# Patient Record
Sex: Male | Born: 1940 | Race: White | Hispanic: No | Smoking: Former smoker
Health system: Southern US, Community
[De-identification: ages and names within clinical notes are randomized; demographics above are authoritative.]

## PROBLEM LIST (undated history)

## (undated) DIAGNOSIS — I10 Essential (primary) hypertension: Secondary | ICD-10-CM

## (undated) DIAGNOSIS — C61 Malignant neoplasm of prostate: Secondary | ICD-10-CM

## (undated) DIAGNOSIS — M1990 Unspecified osteoarthritis, unspecified site: Secondary | ICD-10-CM

## (undated) DIAGNOSIS — L732 Hidradenitis suppurativa: Secondary | ICD-10-CM

## (undated) DIAGNOSIS — E785 Hyperlipidemia, unspecified: Secondary | ICD-10-CM

## (undated) DIAGNOSIS — I219 Acute myocardial infarction, unspecified: Secondary | ICD-10-CM

## (undated) DIAGNOSIS — J189 Pneumonia, unspecified organism: Secondary | ICD-10-CM

## (undated) DIAGNOSIS — K219 Gastro-esophageal reflux disease without esophagitis: Secondary | ICD-10-CM

## (undated) DIAGNOSIS — I739 Peripheral vascular disease, unspecified: Secondary | ICD-10-CM

## (undated) DIAGNOSIS — IMO0002 Reserved for concepts with insufficient information to code with codable children: Secondary | ICD-10-CM

## (undated) DIAGNOSIS — R350 Frequency of micturition: Secondary | ICD-10-CM

## (undated) DIAGNOSIS — I714 Abdominal aortic aneurysm, without rupture, unspecified: Secondary | ICD-10-CM

## (undated) DIAGNOSIS — R3915 Urgency of urination: Secondary | ICD-10-CM

## (undated) DIAGNOSIS — IMO0001 Reserved for inherently not codable concepts without codable children: Secondary | ICD-10-CM

## (undated) DIAGNOSIS — I779 Disorder of arteries and arterioles, unspecified: Secondary | ICD-10-CM

## (undated) DIAGNOSIS — I2510 Atherosclerotic heart disease of native coronary artery without angina pectoris: Secondary | ICD-10-CM

## (undated) DIAGNOSIS — I491 Atrial premature depolarization: Secondary | ICD-10-CM

## (undated) DIAGNOSIS — C3490 Malignant neoplasm of unspecified part of unspecified bronchus or lung: Secondary | ICD-10-CM

## (undated) DIAGNOSIS — I639 Cerebral infarction, unspecified: Secondary | ICD-10-CM

## (undated) DIAGNOSIS — J449 Chronic obstructive pulmonary disease, unspecified: Secondary | ICD-10-CM

## (undated) DIAGNOSIS — I255 Ischemic cardiomyopathy: Secondary | ICD-10-CM

## (undated) DIAGNOSIS — F419 Anxiety disorder, unspecified: Secondary | ICD-10-CM

## (undated) HISTORY — PX: PROSTATE SURGERY: SHX751

## (undated) HISTORY — DX: Malignant neoplasm of prostate: C61

## (undated) HISTORY — PX: VASCULAR SURGERY: SHX849

## (undated) HISTORY — DX: Unspecified osteoarthritis, unspecified site: M19.90

## (undated) HISTORY — DX: Ischemic cardiomyopathy: I25.5

## (undated) HISTORY — DX: Pneumonia, unspecified organism: J18.9

## (undated) HISTORY — DX: Anxiety disorder, unspecified: F41.9

## (undated) HISTORY — DX: Hyperlipidemia, unspecified: E78.5

## (undated) HISTORY — DX: Peripheral vascular disease, unspecified: I73.9

## (undated) HISTORY — DX: Disorder of arteries and arterioles, unspecified: I77.9

## (undated) HISTORY — DX: Hidradenitis suppurativa: L73.2

## (undated) HISTORY — DX: Atrial premature depolarization: I49.1

## (undated) HISTORY — DX: Cerebral infarction, unspecified: I63.9

## (undated) HISTORY — DX: Essential (primary) hypertension: I10

## (undated) HISTORY — DX: Chronic obstructive pulmonary disease, unspecified: J44.9

## (undated) HISTORY — PX: CARDIAC CATHETERIZATION: SHX172

## (undated) HISTORY — PX: CORONARY STENT PLACEMENT: SHX1402

## (undated) HISTORY — DX: Atherosclerotic heart disease of native coronary artery without angina pectoris: I25.10

---

## 1993-03-22 HISTORY — PX: DOPPLER ECHOCARDIOGRAPHY: SHX263

## 1998-11-19 ENCOUNTER — Inpatient Hospital Stay (HOSPITAL_COMMUNITY): Admission: EM | Admit: 1998-11-19 | Discharge: 1998-11-23 | Payer: Self-pay | Admitting: Emergency Medicine

## 1998-11-20 ENCOUNTER — Encounter: Payer: Self-pay | Admitting: *Deleted

## 2000-03-22 HISTORY — PX: CAROTID ENDARTERECTOMY: SUR193

## 2000-03-24 ENCOUNTER — Encounter: Payer: Self-pay | Admitting: Vascular Surgery

## 2000-03-28 ENCOUNTER — Encounter (INDEPENDENT_AMBULATORY_CARE_PROVIDER_SITE_OTHER): Payer: Self-pay | Admitting: *Deleted

## 2000-03-28 ENCOUNTER — Inpatient Hospital Stay (HOSPITAL_COMMUNITY): Admission: RE | Admit: 2000-03-28 | Discharge: 2000-03-29 | Payer: Self-pay | Admitting: Vascular Surgery

## 2001-04-22 HISTORY — PX: COLONOSCOPY: SHX174

## 2004-01-30 ENCOUNTER — Ambulatory Visit: Payer: Self-pay | Admitting: Family Medicine

## 2004-09-14 ENCOUNTER — Ambulatory Visit: Payer: Self-pay | Admitting: Family Medicine

## 2004-09-28 ENCOUNTER — Ambulatory Visit: Payer: Self-pay

## 2004-09-29 ENCOUNTER — Ambulatory Visit: Payer: Self-pay | Admitting: Family Medicine

## 2004-11-11 ENCOUNTER — Ambulatory Visit: Payer: Self-pay | Admitting: Cardiology

## 2004-11-30 ENCOUNTER — Ambulatory Visit: Payer: Self-pay | Admitting: Family Medicine

## 2004-12-10 ENCOUNTER — Ambulatory Visit: Payer: Self-pay | Admitting: Internal Medicine

## 2005-01-11 ENCOUNTER — Ambulatory Visit: Payer: Self-pay | Admitting: Family Medicine

## 2005-01-13 ENCOUNTER — Ambulatory Visit: Payer: Self-pay | Admitting: Critical Care Medicine

## 2005-02-09 ENCOUNTER — Ambulatory Visit: Payer: Self-pay | Admitting: Critical Care Medicine

## 2005-02-16 ENCOUNTER — Ambulatory Visit: Payer: Self-pay | Admitting: Critical Care Medicine

## 2005-05-21 ENCOUNTER — Ambulatory Visit: Payer: Self-pay | Admitting: Family Medicine

## 2005-05-26 ENCOUNTER — Ambulatory Visit: Payer: Self-pay | Admitting: Family Medicine

## 2005-05-26 LAB — CONVERTED CEMR LAB: PSA: 5.76 ng/mL

## 2005-10-11 ENCOUNTER — Ambulatory Visit: Payer: Self-pay | Admitting: Family Medicine

## 2005-11-18 ENCOUNTER — Ambulatory Visit: Payer: Self-pay | Admitting: Cardiology

## 2005-12-03 ENCOUNTER — Ambulatory Visit: Payer: Self-pay | Admitting: Critical Care Medicine

## 2005-12-21 ENCOUNTER — Ambulatory Visit: Payer: Self-pay | Admitting: Family Medicine

## 2006-02-02 ENCOUNTER — Ambulatory Visit: Payer: Self-pay | Admitting: Family Medicine

## 2006-05-02 ENCOUNTER — Ambulatory Visit: Payer: Self-pay | Admitting: Family Medicine

## 2006-05-07 ENCOUNTER — Inpatient Hospital Stay (HOSPITAL_COMMUNITY): Admission: EM | Admit: 2006-05-07 | Discharge: 2006-05-11 | Payer: Self-pay | Admitting: Emergency Medicine

## 2006-05-07 ENCOUNTER — Ambulatory Visit: Payer: Self-pay | Admitting: Internal Medicine

## 2006-05-18 ENCOUNTER — Ambulatory Visit: Payer: Self-pay | Admitting: Family Medicine

## 2006-06-16 ENCOUNTER — Ambulatory Visit: Payer: Self-pay | Admitting: Family Medicine

## 2006-12-21 HISTORY — PX: CARDIOVASCULAR STRESS TEST: SHX262

## 2006-12-23 ENCOUNTER — Ambulatory Visit: Payer: Self-pay | Admitting: Cardiology

## 2006-12-30 ENCOUNTER — Encounter: Payer: Self-pay | Admitting: Family Medicine

## 2006-12-30 ENCOUNTER — Ambulatory Visit: Payer: Self-pay

## 2007-01-20 ENCOUNTER — Ambulatory Visit: Payer: Self-pay | Admitting: Family Medicine

## 2007-01-27 ENCOUNTER — Ambulatory Visit: Payer: Self-pay | Admitting: Family Medicine

## 2007-01-27 DIAGNOSIS — I2510 Atherosclerotic heart disease of native coronary artery without angina pectoris: Secondary | ICD-10-CM

## 2007-01-27 DIAGNOSIS — E7800 Pure hypercholesterolemia, unspecified: Secondary | ICD-10-CM | POA: Insufficient documentation

## 2007-01-30 LAB — CONVERTED CEMR LAB
AST: 19 units/L (ref 0–37)
Bilirubin, Direct: 0.1 mg/dL (ref 0.0–0.3)
CO2: 33 meq/L — ABNORMAL HIGH (ref 19–32)
Chloride: 101 meq/L (ref 96–112)
Eosinophils Absolute: 0.2 10*3/uL (ref 0.0–0.6)
Eosinophils Relative: 2.7 % (ref 0.0–5.0)
GFR calc non Af Amer: 71 mL/min
Glucose, Bld: 107 mg/dL — ABNORMAL HIGH (ref 70–99)
HCT: 45 % (ref 39.0–52.0)
Lymphocytes Relative: 28.3 % (ref 12.0–46.0)
MCV: 96.4 fL (ref 78.0–100.0)
Neutrophils Relative %: 60.1 % (ref 43.0–77.0)
RBC: 4.67 M/uL (ref 4.22–5.81)
Sodium: 140 meq/L (ref 135–145)
Total Bilirubin: 0.7 mg/dL (ref 0.3–1.2)
Total CHOL/HDL Ratio: 3.9
Total Protein: 7 g/dL (ref 6.0–8.3)
WBC: 6.7 10*3/uL (ref 4.5–10.5)

## 2007-02-10 ENCOUNTER — Ambulatory Visit: Payer: Self-pay | Admitting: Family Medicine

## 2007-02-10 DIAGNOSIS — I2510 Atherosclerotic heart disease of native coronary artery without angina pectoris: Secondary | ICD-10-CM | POA: Insufficient documentation

## 2007-02-10 DIAGNOSIS — E785 Hyperlipidemia, unspecified: Secondary | ICD-10-CM | POA: Insufficient documentation

## 2007-02-10 DIAGNOSIS — I252 Old myocardial infarction: Secondary | ICD-10-CM | POA: Insufficient documentation

## 2007-02-10 DIAGNOSIS — F17200 Nicotine dependence, unspecified, uncomplicated: Secondary | ICD-10-CM

## 2007-02-10 DIAGNOSIS — K649 Unspecified hemorrhoids: Secondary | ICD-10-CM | POA: Insufficient documentation

## 2007-02-10 DIAGNOSIS — M1990 Unspecified osteoarthritis, unspecified site: Secondary | ICD-10-CM | POA: Insufficient documentation

## 2007-02-10 DIAGNOSIS — J449 Chronic obstructive pulmonary disease, unspecified: Secondary | ICD-10-CM

## 2007-02-10 DIAGNOSIS — J438 Other emphysema: Secondary | ICD-10-CM | POA: Insufficient documentation

## 2007-02-10 DIAGNOSIS — H3400 Transient retinal artery occlusion, unspecified eye: Secondary | ICD-10-CM

## 2007-02-10 DIAGNOSIS — I739 Peripheral vascular disease, unspecified: Secondary | ICD-10-CM | POA: Insufficient documentation

## 2007-05-26 ENCOUNTER — Telehealth: Payer: Self-pay | Admitting: Family Medicine

## 2007-06-19 ENCOUNTER — Ambulatory Visit: Payer: Self-pay | Admitting: Family Medicine

## 2007-06-19 DIAGNOSIS — J441 Chronic obstructive pulmonary disease with (acute) exacerbation: Secondary | ICD-10-CM | POA: Insufficient documentation

## 2007-06-22 ENCOUNTER — Ambulatory Visit: Payer: Self-pay | Admitting: Family Medicine

## 2007-06-26 ENCOUNTER — Telehealth (INDEPENDENT_AMBULATORY_CARE_PROVIDER_SITE_OTHER): Payer: Self-pay | Admitting: Internal Medicine

## 2007-07-07 ENCOUNTER — Ambulatory Visit: Payer: Self-pay | Admitting: Cardiology

## 2007-07-14 ENCOUNTER — Ambulatory Visit: Payer: Self-pay | Admitting: Family Medicine

## 2007-07-14 LAB — CONVERTED CEMR LAB
Albumin: 4 g/dL (ref 3.5–5.2)
BUN: 17 mg/dL (ref 6–23)
Basophils Relative: 0.3 % (ref 0.0–1.0)
Creatinine, Ser: 1 mg/dL (ref 0.4–1.5)
Eosinophils Absolute: 0.3 10*3/uL (ref 0.0–0.7)
Eosinophils Relative: 4.7 % (ref 0.0–5.0)
GFR calc Af Amer: 96 mL/min
GFR calc non Af Amer: 79 mL/min
Glucose, Bld: 106 mg/dL — ABNORMAL HIGH (ref 70–99)
HCT: 45.2 % (ref 39.0–52.0)
HDL: 47.7 mg/dL (ref >39.0)
Hemoglobin: 15.7 g/dL (ref 13.0–17.0)
MCV: 96.2 fL (ref 78.0–100.0)
Monocytes Absolute: 0.5 10*3/uL (ref 0.1–1.0)
Neutro Abs: 4.5 10*3/uL (ref 1.4–7.7)
RBC: 4.7 M/uL (ref 4.22–5.81)
TSH: 1.48 microintl units/mL (ref 0.35–5.50)
Total Protein: 6.7 g/dL (ref 6.0–8.3)
WBC: 7.1 10*3/uL (ref 4.5–10.5)

## 2007-09-05 ENCOUNTER — Ambulatory Visit: Payer: Self-pay | Admitting: Cardiology

## 2007-09-05 LAB — CONVERTED CEMR LAB
BUN: 12 mg/dL (ref 6–23)
Chloride: 104 meq/L (ref 96–112)
Eosinophils Absolute: 0.2 10*3/uL (ref 0.0–0.7)
Eosinophils Relative: 2.7 % (ref 0.0–5.0)
GFR calc Af Amer: 108 mL/min
GFR calc non Af Amer: 89 mL/min
INR: 0.9 (ref 0.8–1.0)
MCV: 99 fL (ref 78.0–100.0)
Monocytes Absolute: 0.4 10*3/uL (ref 0.1–1.0)
Neutrophils Relative %: 64.5 % (ref 43.0–77.0)
Platelets: 238 10*3/uL (ref 150–400)
Potassium: 4.2 meq/L (ref 3.5–5.1)
RDW: 12.8 % (ref 11.5–14.6)
Sodium: 140 meq/L (ref 135–145)
WBC: 7.4 10*3/uL (ref 4.5–10.5)
aPTT: 33 s — ABNORMAL HIGH (ref 21.7–29.8)

## 2007-09-07 ENCOUNTER — Inpatient Hospital Stay (HOSPITAL_BASED_OUTPATIENT_CLINIC_OR_DEPARTMENT_OTHER): Admission: RE | Admit: 2007-09-07 | Discharge: 2007-09-07 | Payer: Self-pay | Admitting: Cardiology

## 2007-09-07 ENCOUNTER — Inpatient Hospital Stay (HOSPITAL_COMMUNITY): Admission: AD | Admit: 2007-09-07 | Discharge: 2007-09-08 | Payer: Self-pay | Admitting: Cardiology

## 2007-09-07 ENCOUNTER — Encounter: Payer: Self-pay | Admitting: Family Medicine

## 2007-09-07 ENCOUNTER — Ambulatory Visit: Payer: Self-pay | Admitting: Cardiology

## 2007-09-08 ENCOUNTER — Encounter: Payer: Self-pay | Admitting: Family Medicine

## 2007-09-15 ENCOUNTER — Telehealth: Payer: Self-pay | Admitting: Family Medicine

## 2007-09-18 ENCOUNTER — Ambulatory Visit: Payer: Self-pay | Admitting: Family Medicine

## 2007-09-29 ENCOUNTER — Ambulatory Visit: Payer: Self-pay | Admitting: Cardiology

## 2007-11-06 ENCOUNTER — Telehealth (INDEPENDENT_AMBULATORY_CARE_PROVIDER_SITE_OTHER): Payer: Self-pay | Admitting: *Deleted

## 2008-04-05 ENCOUNTER — Ambulatory Visit: Payer: Self-pay | Admitting: Cardiology

## 2008-05-03 ENCOUNTER — Ambulatory Visit: Payer: Self-pay | Admitting: Cardiology

## 2008-05-03 LAB — CONVERTED CEMR LAB
Alkaline Phosphatase: 37 units/L — ABNORMAL LOW (ref 39–117)
Basophils Absolute: 0 10*3/uL (ref 0.0–0.1)
Bilirubin, Direct: 0.1 mg/dL (ref 0.0–0.3)
Calcium: 9.2 mg/dL (ref 8.4–10.5)
Cholesterol: 144 mg/dL (ref 0–200)
Eosinophils Absolute: 0.2 10*3/uL (ref 0.0–0.7)
GFR calc Af Amer: 108 mL/min
GFR calc non Af Amer: 89 mL/min
Glucose, Bld: 110 mg/dL — ABNORMAL HIGH (ref 70–99)
HCT: 44.7 % (ref 39.0–52.0)
HDL: 41.7 mg/dL (ref >39.0)
Hemoglobin: 15.7 g/dL (ref 13.0–17.0)
MCHC: 35.1 g/dL (ref 30.0–36.0)
MCV: 98.2 fL (ref 78.0–100.0)
Monocytes Absolute: 0.4 10*3/uL (ref 0.1–1.0)
Monocytes Relative: 7.5 % (ref 3.0–12.0)
Neutro Abs: 3.6 10*3/uL (ref 1.4–7.7)
Platelets: 225 10*3/uL (ref 150–400)
Potassium: 4.4 meq/L (ref 3.5–5.1)
RDW: 12.2 % (ref 11.5–14.6)
Sodium: 138 meq/L (ref 135–145)
Total CHOL/HDL Ratio: 3.5
Total Protein: 6.6 g/dL (ref 6.0–8.3)
Triglycerides: 66 mg/dL (ref 0–149)

## 2008-09-18 ENCOUNTER — Ambulatory Visit: Payer: Self-pay | Admitting: Family Medicine

## 2008-09-19 ENCOUNTER — Encounter (INDEPENDENT_AMBULATORY_CARE_PROVIDER_SITE_OTHER): Payer: Self-pay | Admitting: Internal Medicine

## 2008-09-19 DIAGNOSIS — Z8546 Personal history of malignant neoplasm of prostate: Secondary | ICD-10-CM

## 2008-09-24 ENCOUNTER — Telehealth: Payer: Self-pay | Admitting: Family Medicine

## 2008-09-24 ENCOUNTER — Emergency Department (HOSPITAL_COMMUNITY): Admission: EM | Admit: 2008-09-24 | Discharge: 2008-09-24 | Payer: Self-pay | Admitting: Emergency Medicine

## 2008-09-25 ENCOUNTER — Ambulatory Visit: Payer: Self-pay | Admitting: Family Medicine

## 2008-09-29 DIAGNOSIS — I10 Essential (primary) hypertension: Secondary | ICD-10-CM

## 2008-10-01 ENCOUNTER — Ambulatory Visit: Payer: Self-pay | Admitting: Cardiology

## 2008-10-31 ENCOUNTER — Encounter: Payer: Self-pay | Admitting: Family Medicine

## 2008-11-04 ENCOUNTER — Telehealth: Payer: Self-pay | Admitting: Cardiology

## 2008-11-14 ENCOUNTER — Encounter: Payer: Self-pay | Admitting: Family Medicine

## 2008-11-20 ENCOUNTER — Ambulatory Visit: Admission: RE | Admit: 2008-11-20 | Discharge: 2009-02-18 | Payer: Self-pay | Admitting: Radiation Oncology

## 2008-11-21 ENCOUNTER — Encounter: Payer: Self-pay | Admitting: Family Medicine

## 2008-11-26 ENCOUNTER — Telehealth: Payer: Self-pay | Admitting: Cardiology

## 2009-01-24 LAB — URINALYSIS, MICROSCOPIC - CHCC
Blood: NEGATIVE
Nitrite: NEGATIVE
Protein: NEGATIVE mg/dL
Specific Gravity, Urine: 1.01 (ref 1.003–1.035)
pH: 5 (ref 4.6–8.0)

## 2009-02-11 ENCOUNTER — Encounter (INDEPENDENT_AMBULATORY_CARE_PROVIDER_SITE_OTHER): Payer: Self-pay | Admitting: Internal Medicine

## 2009-02-19 ENCOUNTER — Ambulatory Visit: Admission: RE | Admit: 2009-02-19 | Discharge: 2009-02-28 | Payer: Self-pay | Admitting: Radiation Oncology

## 2009-02-26 ENCOUNTER — Encounter: Payer: Self-pay | Admitting: Family Medicine

## 2009-04-02 ENCOUNTER — Encounter: Payer: Self-pay | Admitting: Family Medicine

## 2009-06-12 ENCOUNTER — Telehealth (INDEPENDENT_AMBULATORY_CARE_PROVIDER_SITE_OTHER): Payer: Self-pay | Admitting: *Deleted

## 2009-06-23 ENCOUNTER — Ambulatory Visit: Payer: Self-pay | Admitting: Family Medicine

## 2009-08-22 ENCOUNTER — Telehealth: Payer: Self-pay | Admitting: Gastroenterology

## 2009-10-02 ENCOUNTER — Ambulatory Visit: Payer: Self-pay | Admitting: Cardiology

## 2009-10-20 ENCOUNTER — Telehealth: Payer: Self-pay | Admitting: Family Medicine

## 2009-10-28 ENCOUNTER — Ambulatory Visit: Payer: Self-pay | Admitting: Family Medicine

## 2009-10-29 ENCOUNTER — Telehealth: Payer: Self-pay | Admitting: Family Medicine

## 2009-10-29 LAB — CONVERTED CEMR LAB
Cholesterol: 175 mg/dL (ref 0–200)
HDL: 46.7 mg/dL (ref >39.00)
LDL Cholesterol: 114 mg/dL — ABNORMAL HIGH (ref 0–99)
Triglycerides: 72 mg/dL (ref 0.0–149.0)

## 2009-11-04 ENCOUNTER — Telehealth: Payer: Self-pay | Admitting: Family Medicine

## 2009-11-04 ENCOUNTER — Telehealth: Payer: Self-pay | Admitting: Cardiology

## 2009-11-21 ENCOUNTER — Ambulatory Visit: Payer: Self-pay | Admitting: Emergency Medicine

## 2009-12-12 ENCOUNTER — Ambulatory Visit: Payer: Self-pay | Admitting: Family Medicine

## 2009-12-12 DIAGNOSIS — F419 Anxiety disorder, unspecified: Secondary | ICD-10-CM

## 2010-01-02 ENCOUNTER — Ambulatory Visit: Payer: Self-pay | Admitting: Emergency Medicine

## 2010-01-22 ENCOUNTER — Emergency Department (HOSPITAL_COMMUNITY)
Admission: EM | Admit: 2010-01-22 | Discharge: 2010-01-22 | Payer: Self-pay | Source: Home / Self Care | Admitting: Emergency Medicine

## 2010-01-26 ENCOUNTER — Ambulatory Visit: Payer: Self-pay | Admitting: Family Medicine

## 2010-02-10 ENCOUNTER — Telehealth: Payer: Self-pay | Admitting: Family Medicine

## 2010-02-20 ENCOUNTER — Ambulatory Visit: Payer: Self-pay | Admitting: Emergency Medicine

## 2010-02-20 ENCOUNTER — Telehealth (INDEPENDENT_AMBULATORY_CARE_PROVIDER_SITE_OTHER): Payer: Self-pay | Admitting: *Deleted

## 2010-03-05 ENCOUNTER — Ambulatory Visit: Payer: Self-pay | Admitting: Emergency Medicine

## 2010-03-26 ENCOUNTER — Telehealth: Payer: Self-pay | Admitting: Family Medicine

## 2010-04-02 ENCOUNTER — Encounter: Payer: Self-pay | Admitting: Physician Assistant

## 2010-04-02 ENCOUNTER — Ambulatory Visit
Admission: RE | Admit: 2010-04-02 | Discharge: 2010-04-02 | Payer: Self-pay | Source: Home / Self Care | Attending: Physician Assistant | Admitting: Physician Assistant

## 2010-04-02 DIAGNOSIS — R079 Chest pain, unspecified: Secondary | ICD-10-CM | POA: Insufficient documentation

## 2010-04-02 DIAGNOSIS — I6529 Occlusion and stenosis of unspecified carotid artery: Secondary | ICD-10-CM | POA: Insufficient documentation

## 2010-04-10 ENCOUNTER — Telehealth: Payer: Self-pay | Admitting: Physician Assistant

## 2010-04-13 ENCOUNTER — Telehealth (INDEPENDENT_AMBULATORY_CARE_PROVIDER_SITE_OTHER): Payer: Self-pay | Admitting: Radiology

## 2010-04-14 ENCOUNTER — Encounter: Payer: Self-pay | Admitting: Cardiovascular Disease

## 2010-04-14 ENCOUNTER — Encounter (INDEPENDENT_AMBULATORY_CARE_PROVIDER_SITE_OTHER): Payer: Self-pay | Admitting: *Deleted

## 2010-04-14 ENCOUNTER — Ambulatory Visit: Admission: RE | Admit: 2010-04-14 | Discharge: 2010-04-14 | Payer: Self-pay | Source: Home / Self Care

## 2010-04-14 ENCOUNTER — Ambulatory Visit
Admission: RE | Admit: 2010-04-14 | Discharge: 2010-04-14 | Payer: Self-pay | Source: Home / Self Care | Attending: Internal Medicine | Admitting: Internal Medicine

## 2010-04-14 ENCOUNTER — Encounter (HOSPITAL_COMMUNITY)
Admission: RE | Admit: 2010-04-14 | Discharge: 2010-04-21 | Payer: Self-pay | Source: Home / Self Care | Attending: Cardiovascular Disease | Admitting: Cardiovascular Disease

## 2010-04-14 ENCOUNTER — Inpatient Hospital Stay (HOSPITAL_COMMUNITY)
Admission: EM | Admit: 2010-04-14 | Discharge: 2010-04-15 | Payer: Self-pay | Source: Home / Self Care | Attending: Cardiovascular Disease | Admitting: Cardiovascular Disease

## 2010-04-15 LAB — BASIC METABOLIC PANEL
Chloride: 102 mEq/L (ref 96–112)
GFR calc Af Amer: >60 mL/min (ref >60)
Potassium: 4 mEq/L (ref 3.5–5.1)

## 2010-04-15 LAB — COMPREHENSIVE METABOLIC PANEL
AST: 20 U/L (ref 0–37)
Albumin: 4.2 g/dL (ref 3.5–5.2)
Calcium: 9.5 mg/dL (ref 8.4–10.5)
Chloride: 101 mEq/L (ref 96–112)
Creatinine, Ser: 0.96 mg/dL (ref 0.4–1.5)
GFR calc Af Amer: >60 mL/min (ref >60)
Sodium: 140 mEq/L (ref 135–145)
Total Bilirubin: 0.8 mg/dL (ref 0.3–1.2)

## 2010-04-15 LAB — CBC
HCT: 45 % (ref 39.0–52.0)
Hemoglobin: 15.5 g/dL (ref 13.0–17.0)
MCH: 32.8 pg (ref 26.0–34.0)
MCHC: 34.4 g/dL (ref 30.0–36.0)
MCV: 95.1 fL (ref 78.0–100.0)
Platelets: 210 10*3/uL (ref 150–400)
Platelets: 195 10*3/uL (ref 150–400)
RBC: 4.73 MIL/uL (ref 4.22–5.81)
RBC: 4.15 MIL/uL — ABNORMAL LOW (ref 4.22–5.81)
RDW: 12.7 % (ref 11.5–15.5)
WBC: 5.8 10*3/uL (ref 4.0–10.5)
WBC: 4.5 10*3/uL (ref 4.0–10.5)

## 2010-04-15 LAB — CK TOTAL AND CKMB (NOT AT ARMC): CK, MB: 1 ng/mL (ref 0.3–4.0)

## 2010-04-15 LAB — CARDIAC PANEL(CRET KIN+CKTOT+MB+TROPI)
CK, MB: 1.5 ng/mL (ref 0.3–4.0)
Relative Index: INVALID (ref 0.0–2.5)
Relative Index: INVALID (ref 0.0–2.5)
Total CK: 79 U/L (ref 7–232)
Troponin I: 0.08 ng/mL — ABNORMAL HIGH (ref 0.00–0.06)

## 2010-04-15 LAB — APTT: aPTT: 38 seconds — ABNORMAL HIGH (ref 24–37)

## 2010-04-15 NOTE — Procedures (Addendum)
NAME:  [name], PILE NO.:  [id number]  MEDICAL RECORD NO.:  [id number]          PATIENT TYPE:  INP  LOCATION:  6523                         FACILITY:  [facility]  PHYSICIAN:  Verne Carrow, MDDATE OF BIRTH:  1941/02/10  DATE OF PROCEDURE:  04/14/2010 DATE OF DISCHARGE:                           CARDIAC CATHETERIZATION   PRIMARY CARE PHYSICIAN:  Marne A. Tower, MD  PROCEDURES PERFORMED: 1. Percutaneous transluminal coronary angioplasty with placement of a     drug-eluting stent in the distal atrioventricular groove circumflex     artery. 2. Percutaneous transluminal coronary angioplasty with placement of a     drug-eluting stent in the first obtuse marginal branch of the     circumflex artery.  OPERATOR:  Verne Carrow, MD  INDICATIONS:  This is a 70 year old Caucasian male with previous stenting procedures for coronary artery disease who has had recent complaints of chest pain.  The patient was admitted today for diagnostic left heart catheterization which was performed by Dr. Rollene Rotunda.  I was asked to come in and assist with intervention.  DETAILS OF PROCEDURE:  When I entered the case, the patient had a 5- [place] sheath present in the right femoral artery.  We upsized to a 6- [place] sheath.  We then selectively engage the left main artery with a 6- [place] XB 3.0 guiding catheter.  The patient was given a bolus of Angiomax and a drip was started.  He was given 300 mg of Plavix by mouth.  When the ACT was greater than 200, I passed a cougar intracoronary wire down the AV groove circumflex beyond the area of tightest stenosis.  I then placed a second cougar wire down the large obtuse marginal branch beyond the area of tightest stenosis.  I elected to work on the AV groove circumflex vessel first.  The ACT was greater than 200.  We passed a 2.0 x 12 mm balloon down to the area of tightest stenosis and inflated twice.  A 2.5 x 20 mm PROMUS  Element drug-eluting stent was carefully positioned in the distal AV groove circumflex branch and was deployed.  A 2.5 x 15 mm noncompliant balloon was carefully positioned in the stent and was inflated to 14 atmospheres.  There was an excellent angiographic result.  The stenosis was taken from 99% down to 0%.  At this point, we turned our attention toward the severe stenosis in the ostium of the first obtuse marginal branch.  A 2.5 x 15 mm balloon was positioned in the stenosis and was inflated.  A 3.0 x 16 mm PROMUS Element drug-eluting stent was carefully positioned and deployed in this vessel.  A 3.25 x 15 mm noncompliant balloon was positioned and the stent was inflated.  The stenosis was take from 99% down to 0%.  There was an excellent angiographic result.  We did not compromise flow into the AV groove circumflex vessel.  The patient tolerated the procedure well and was taken to the holding area in stable condition.  IMPRESSION:  Successful percutaneous coronary intervention with placement of a drug-eluting stent in the atrioventricular groove circumflex artery and placement of  a drug-eluting stent in the first obtuse marginal coronary artery.  RECOMMENDATIONS:  The patient should be continued on aspirin and Plavix for at least a year.  We will continue his other medications as written.     Verne Carrow, MD     CM/MEDQ  D:  04/14/2010  T:  04/15/2010  Job:  [id number]  cc:   [name], MD  Electronically Signed by Verne Carrow MD on 04/15/2010 04:11:25 PM

## 2010-04-16 LAB — POCT ACTIVATED CLOTTING TIME: Activated Clotting Time: 481 seconds

## 2010-04-16 NOTE — Discharge Summary (Addendum)
NAME:  [name], SPADAFORE NO.:  [id number]  MEDICAL RECORD NO.:  [id number]          PATIENT TYPE:  INP  LOCATION:  6523                         FACILITY:  [facility]  PHYSICIAN:  Verne Carrow, MDDATE OF BIRTH:  [birth year]  DATE OF ADMISSION:  04/14/2010 DATE OF DISCHARGE:  04/15/2010                              DISCHARGE SUMMARY   DISCHARGE DIAGNOSES: 1. Chest pain and unstable angina with abnormal nuclear study as an     outpatient, status post Promus drug-eluting stent placement and     percutaneous coronary intervention x2 to the AV groove circumflex     and OM-1 on April 14, 2010. 2. Prior history of coronary artery disease.     a.     Status post anterior myocardial infarction in 1997, with      stenting x2 to the left anterior descending coronary artery.     b.     Bare-metal stent to circumflex in 2002.     c.     Percutaneous coronary intervention in 2009, with 2 drug- eluting stents to left anterior descending coronary artery.     d.     New percutaneous coronary intervention as above on April 14, 2010. 3. Hypertension. 4. Hyperlipidemia with history of statin intolerance. 5. Chronic obstructive pulmonary disease. 6. Peripheral vascular disease status post right carotid     endarterectomy. 7. Osteoarthritis. 8. Prostate cancer.  HOSPITAL COURSE:  Mr. Clinard is a 70 year old gentleman with a prior history of coronary artery disease outlined as above, COPD, hypertension, and hyperlipidemia, who has been having more recent episodes of chest pain lately.  He was put on Imdur and a Myoview was arranged.  His ST-segment dropped with exertion and inferior lateral leads.  He had a large area of inferolateral scar on last images that actually improved with stress.  He had chest pain walking down the hall in the office and therefore given likely unstable angina and abnormal Myoview, he was admitted to the hospital for further evaluation.  He  had cardiac enzymes which were cycled prior to cath which were negative.  In our cardiac catheterization on April 15, 2010, he was found to have severe stenosis of the AV groove circumflex and OM-1, both of which had PCI and Promus element drug-eluting stent placed to these areas by Dr. Clifton James.  The patient did well post procedurally.  Dr. Clifton James had seen and examined him today and feels he is stable for discharge.  DISCHARGE LABS:  WBC 4.5, hemoglobin 13.7, hematocrit 39.8, platelet count 195.  Sodium 139, potassium 4.0, chloride 102, CO2 28, glucose 96, BUN 9, creatinine 1.02.  LFTs were within normal limits.  STUDIES: 1. Chest x-ray on April 14, 2010, showed chronic lung changes as     before without active disease. 2. Cardiac catheterization on April 14, 2010, please see above for     summary and full report for details.  DISCHARGE MEDICATIONS: 1. Aspirin 81 mg daily. 2. Advair Diskus 500/50 one puff inhaled b.i.d. 3. Alprazolam 0.5 mg daily p.r.n. 4. Diclofenac 50 mg daily p.r.n. with instructions to only  use this     med rarely as it causes increased risk of stomach bleeding while     taking meds such as aspirin and Plavix. 5. DuoNeb 1 treatment inhaled q.6 hours p.r.n. 6. Ibuprofen 200 mg daily as needed with the same stomach bleeding     precautions as above. 7. Nitroglycerin sublingual 0.4 mg every 5 minutes as needed up to 3     doses. 8. Plavix 75 mg daily. 9. Quinapril 10 mg every other day. 10.Spiriva 18 mcg inhaled daily. 11.Tamsulosin 0.4 mg every other day. 12.Toprol-XL 25 mg daily. 13.Ventolin inhaler 2 puffs inhaled every 4 hours.  Please note the patient's Imdur was stopped this admission.  He did not feel it is helping very much as an outpatient and since his PCI, he has had no further chest pain.  He is also not on a statin second to history of statin intolerance.  DISPOSITION:  Mr. Faucett will be discharged in stable condition home. Dr.  Clifton James would like him to refrain from work until April 22, 2010. He has series of vacation days with plan to take anywhere, so this will not be a problem for him.  He is not to lift anything for 1 week, drive for 2 days, or participate in sexual activity for 1 week.  He is to follow a low-sodium, heart-healthy diet.  If he notices any pain, swelling, bleeding, or pus at the cath site, he is to call or return. He will follow up with Dr. Clifton James in approximately 2 weeks and our office will call him with this appointment.  DURATION OF DISCHARGE ENCOUNTER:  Greater than 30 minutes including physician and PA time.     Ronie Spies, P.A.C.   ______________________________ Verne Carrow, MD    DD/MEDQ  D:  04/15/2010  T:  04/15/2010  Job:  [id number]  cc:   [name], MD  Electronically Signed by Verne Carrow MD on 04/16/2010 02:38:25 PM Electronically Signed by Ronie Spies  on 04/22/2010 09:28:16 PM

## 2010-04-16 NOTE — Procedures (Addendum)
  NAME:  [name], [name] NO.:  [id number]  MEDICAL RECORD NO.:  [id number]          PATIENT TYPE:  INP  LOCATION:  6523                         FACILITY:  [facility]  PHYSICIAN:  Rollene Rotunda, MD, FACCDATE OF BIRTH:  05-13-1940  DATE OF PROCEDURE: DATE OF DISCHARGE:                           CARDIAC CATHETERIZATION   PRIMARY CARE PHYSICIAN:  Dr. Ladona Ridgel.  CARDIOLOGIST:  Verne Carrow, MD  PROCEDURE:  Left heart catheterization and coronary arteriography.  INDICATIONS:  The patient with unstable angina.  He has had previous multiple stents to the circumflex and his LAD.  PROCEDURE NOTE:  Left heart catheterization was performed via the right femoral artery.  The aorta was cannulated using an antral wall puncture. A #5-French arterial sheath was inserted via modified Seldinger technique.  Preformed Judkins pigtail catheter utilized.  RESULTS:  Hemodynamics:  LV 161/21, AO 152/83.  Coronaries left main was normal.  The LAD had diffuse luminal irregularities.  There was mid stent, which was patent with diffuse luminal in-stent irregularities. There was long mid 30% stenosis.  Distal stent was widely patent.  First diagonal was small and ostial 70% stenosis.  Second diagonal was small and normal.  Circumflex in the AV groove had diffuse luminal irregularities.  Proximal AV groove had a focal 30% stenosis.  The distal AV groove before small posterolaterals and a PDA had a focal 99% stenosis.  There was a mid obtuse marginal, which was very large.  There was a mid stent in the marginal which was widely patent.  There was ostial 80-90% stenosis.  The right coronary artery is dominant.  There was small 99% stenosis.  Left Ventriculogram:  Left ventriculogram was obtained in the RAO projection.  The EF of 60% with inferior hypokinesis.  CONCLUSION:  Severe single-vessel coronary artery disease.  Residual nonobstructive disease elsewhere.  Patent stents as  described.  Normal left ventricular function.  PLAN:  The patient with percutaneous revascularization of the circumflex obtuse marginal and circumflex in the AV groove.     Rollene Rotunda, MD, [facility]     JH/MEDQ  D:  04/14/2010  T:  04/15/2010  Job:  [id number]  Electronically Signed by Rollene Rotunda MD [facility] on 04/16/2010 12:32:31 PM

## 2010-04-21 NOTE — Letter (Signed)
[facility]   Imported By: Lanelle Bal 04/16/2009 15:44:50  _____________________________________________________________________  External Attachment:    Type:   Image     Comment:   External Document

## 2010-04-21 NOTE — Progress Notes (Signed)
Summary: Schedule NP3 to schedule Colonoscopy   Phone Note Outgoing Call Call back at [facility] Phone [phone]   Call placed by: Harlow Mares CMA Duncan Dull),  August 22, 2009 9:40 AM Call placed to: Patient Summary of Call: Left message on patients machine to call back. patient needs to schedule an office visit to discuss having a colonoscopy due to pt being on plavix.  Initial call taken by: Harlow Mares CMA Duncan Dull),  August 22, 2009 9:41 AM  Follow-up for Phone Call        No Answer A letter will be mailed to the patient.   Follow-up by: Harlow Mares CMA Duncan Dull),  September 01, 2009 2:21 PM

## 2010-04-21 NOTE — Assessment & Plan Note (Signed)
Summary: per check out  Medications Added QUINAPRIL HCL 10 MG  TABS (QUINAPRIL HCL) take 1 tab every other day TAMSULOSIN HCL 0.4 MG CAPS (TAMSULOSIN HCL) every other day      Allergies Added:   Visit Type:  Follow-up Primary Provider:  DR Jilda [facility] [place]  CC:  no complaints.  History of Present Illness: The patient is seen on his other returns for management of CAD. He had an anterior MI remote lesion with a bare-metal stent to the LAD and had a stent to the circumflex artery. In 2009 he had in-stent restenosis and with some looseness to the LAD. He has done well since that time and no chest pain or palpitations.   He had been under some stress recently as his son had been diluted and wound manslaughter for regular accident.  His other problems include hypertension hyperlipidemia. He has been intolerant to statins. He so continues to smoke although he was down to three quarters a pack a day from 2 packs a day.  Current Medications (verified): 1)  Nitroglycerin 0.4 Mg Subl (Nitroglycerin) .... Place 1 Tablet Under Tongue As Directed 2)  Metoprolol Succinate 25 Mg Xr24h-Tab (Metoprolol Succinate) .... Take 1 Tablet By Mouth Once A Day' 3)  Quinapril Hcl 10 Mg  Tabs (Quinapril Hcl) .... Take 1 Tab Every Other Day 4)  Plavix 75 Mg  Tabs (Clopidogrel Bisulfate) .... Take 1 Tablet By Mouth Once A Day 5)  Aspirin 325 Mg Tabs (Aspirin) .... Take 1 By Mouth Once Daily 6)  Ventolin Hfa 108 (90 Base) Mcg/act Aers (Albuterol Sulfate) .... 2 Puffs Every 4 Hrs As Needed Wheezing 7)  Advair Diskus 500-50 Mcg/dose Misc (Fluticasone-Salmeterol) .Marland Kitchen.. 1 Puff Two Times A Day For Wheezing 8)  Duoneb 0.5-2.5 (3) Mg/36ml Soln (Ipratropium-Albuterol) .... Use As Directed 9)  Tamsulosin Hcl 0.4 Mg Caps (Tamsulosin Hcl) .... Every Other Day  Allergies (verified): 1)  ! Sulfa  Past History:  Past Medical History: Reviewed history from 06/23/2009 and no changes required. COPD Coronary  artery disease Hyperlipidemia Myocardial infarction, hx of Osteoarthritis Peripheral vascular disease 1. Coronary artery disease status post prior stenting of the left     anterior descending for an anterior wall infarction in 1997 with 2     Palmaz-Schatz stent, status post prior stenting of the circumflex     artery with a bare-metal stent in 2002, and recent placement of 2     non-overlapping drug-eluting stents in the proximal and mid left     anterior descending. 2. Good left ventricular function. 3. Hyperlipidemia. 4. Hypertension. 5. Status post carotid enterectomy. 6. Cigarette use. 7. Chronic bronchitis.  prostate cancer   urol- Tannenbaum   Review of Systems       ROS is negative except as outlined in HPI.   Vital Signs:  Patient profile:   70 year old male Height:      68 inches Weight:      200 pounds BMI:     30.52 Pulse rate:   71 / minute BP sitting:   130 / 73  (left arm) Cuff size:   regular  Vitals Entered By: Burnett Kanaris, CNA (October 02, 2009 4:21 PM)  Physical Exam  Additional Exam:  Gen. Well-nourished, in no distress   Neck: No JVD, thyroid not enlarged, no carotid bruits Lungs: No tachypnea, clear without rales, rhonchi or wheezes Cardiovascular: Rhythm regular, PMI not displaced,  heart sounds  normal, no murmurs or gallops, no peripheral  edema, pulses normal in all 4 extremities. Abdomen: BS normal, abdomen soft and non-tender without masses or organomegaly, no hepatosplenomegaly. MS: No deformities, no cyanosis or clubbing   Neuro:  No focal sns   Skin:  no lesions    Impression & Recommendations:  Problem # 1:  CORONARY ARTERY DISEASE (ICD-414.00)  He has had previous PCI procedures as described in the history of present illness he's had no chest pain was prominent and stable. His updated medication list for this problem includes:    Nitroglycerin 0.4 Mg Subl (Nitroglycerin) .Marland Kitchen... Place 1 tablet under tongue as directed    Metoprolol  Succinate 25 Mg Xr24h-tab (Metoprolol succinate) .Marland Kitchen... Take 1 tablet by mouth once a day'    Quinapril Hcl 10 Mg Tabs (Quinapril hcl) .Marland Kitchen... Take 1 tab every other day    Plavix 75 Mg Tabs (Clopidogrel bisulfate) .Marland Kitchen... Take 1 tablet by mouth once a day    Aspirin 325 Mg Tabs (Aspirin) .Marland Kitchen... Take 1 by mouth once daily  Orders: EKG w/ Interpretation (93000)  Problem # 2:  HYPERTENSION, BENIGN (ICD-401.1) This appears well controlled on current medications. His updated medication list for this problem includes:    Metoprolol Succinate 25 Mg Xr24h-tab (Metoprolol succinate) .Marland Kitchen... Take 1 tablet by mouth once a day'    Quinapril Hcl 10 Mg Tabs (Quinapril hcl) .Marland Kitchen... Take 1 tab every other day    Aspirin 325 Mg Tabs (Aspirin) .Marland Kitchen... Take 1 by mouth once daily  Problem # 3:  Hx of TOBACCO ABUSE (ICD-305.1) He is still smoking 3/4  pack of cigarettes per go through encouraged him to continue to work on this through  Problem # 4:  HYPERLIPIDEMIA (ICD-272.4) unfortunately  he is intolerant to statins as best we can with diet.  Patient Instructions: 1)  Your physician wants you to follow-up in:  1 year with Dr. Clifton James. You will receive a reminder letter in the mail two months in advance. If you don't receive a letter, please call our office to schedule the follow-up appointment. 2)  Your physician recommends that you continue on your current medications as directed. Please refer to the Current Medication list given to you today. Prescriptions: PLAVIX 75 MG  TABS (CLOPIDOGREL BISULFATE) Take 1 tablet by mouth once a day  #30 x 11   Entered by:   Sherri Rad, RN, BSN   Authorized by:   Lenoria Farrier, MD, [facility]   Signed by:   Sherri Rad, RN, BSN on 10/02/2009   Method used:   Electronically to        [facility] [phone]* (retail)       [address]       [place], [place]  [address]       Ph: [phone] or [phone]       Fax: [phone]   RxID:    [id number] NITROGLYCERIN 0.4 MG SUBL (NITROGLYCERIN) Place 1 tablet under tongue as directed  #25 x 3   Entered by:   Sherri Rad, RN, BSN   Authorized by:   Lenoria Farrier, MD, [facility]   Signed by:   Sherri Rad, RN, BSN on 10/02/2009   Method used:   Electronically to        [facility] [phone]* (retail)       [address]       [place], [place]  [address]       Ph: [phone] or [phone]       Fax: [phone]   RxID:   [id number]

## 2010-04-21 NOTE — Progress Notes (Signed)
Summary: Iprat-Albut 0.5-3(2.5) mg/37ml rx  Phone Note Refill Request Call back at [phone] Message from:  [facility] on October 20, 2009 2:49 PM  Refills Requested: Medication #1:  DUONEB 0.5-2.5 (3) MG/3ML SOLN use as directed [facility] [address] [facility] requested refill for Iprat-Albut 0.5-3(2.5) mg/58ml With insturctions to use as directed.No last refill date sent.Please advise.    Method Requested: Telephone to Pharmacy Initial call taken by: Lewanda Rife LPN,  October 20, 2009 2:50 PM  Follow-up for Phone Call        px written on EMR for call in  Follow-up by: Judith Part MD,  October 20, 2009 3:49 PM  Additional Follow-up for Phone Call Additional follow up Details #1::        [facility]ication [facility] pharmacy as instructed. Lewanda Rife LPN  October 20, 2009 4:47 PM     New/Updated Medications: DUONEB 0.5-2.5 (3) MG/3ML SOLN (IPRATROPIUM-ALBUTEROL) use as directed one treatment every 6 hours as needed Prescriptions: DUONEB 0.5-2.5 (3) MG/3ML SOLN (IPRATROPIUM-ALBUTEROL) use as directed one treatment every 6 hours as needed  #3 months x 1   Entered and Authorized by:   Judith Part MD   Signed by:   Lewanda Rife LPN on 36/64/4034   Method used:   Telephoned to ...       [facility] [phone]* (retail)       [address] [place], [place]  [address]       Ph: [phone] or [phone]       Fax: [phone]   RxID:   [id number]

## 2010-04-21 NOTE — Progress Notes (Signed)
Summary: Pt stopping Paxil and wants med for nerves  Phone Note Outgoing Call Call back at [phone] pt's cell   Call placed by: Lewanda Rife Call placed to: Patient Summary of Call: I called pt with lab and xray results and pt wants a mild or low dose med for nerves and anxiety. Pt said he is not depressed and is afraid of Paxil and will not take the Paxil. Pt said only needs med for nerves on and off not on a daily basis. [facility] uses [facility] [phone]. Pt can be reached on cell at [phone].Please advise. Lewanda Rife LPN  October 29, 2009 4:06 PM   Follow-up for Phone Call        I chose the paxil because it is the best med I know for anxiety (I use that word instead of nerves)  it is not habit forming/ addictive and much less sedating  the "as needed medicines" I think he refers to like xanax and valium are addictive and doses have to be raised over time-as your body needs more - they can only be taken once in a while for that reason (and you can have withdrawl when you stop them)  I generally reserve those for emergencies  let me know what he thinks   Follow-up by: Judith Part MD,  October 29, 2009 5:07 PM  Additional Follow-up for Phone Call Additional follow up Details #1::        Patient notified as instructed by telephone. Pt said he is afraid of the Paxil. The only reason I could get for his fear is it says not to take with Ibuprofen and ASA and pt takes both of those meds and also if he decided to stop the med he would have to titrate off it  according to what pharmacist told pt. Pt would like #10 pills of anxiety med because he only has to take one maybe once or twice a month. Please advise. Lewanda Rife LPN  October 30, 2009 11:54 AM     Additional Follow-up for Phone Call Additional follow up Details #2::    that is ok  px written on EMR for call in  Follow-up by: Judith Part MD,  October 30, 2009 12:45 PM  Additional Follow-up for Phone Call Additional follow up Details  #3:: Details for Additional Follow-up Action Taken: Patient notified as instructed by telephone. [facility] as instructed. Lewanda Rife LPN  October 30, 2009 12:59 PM   New/Updated Medications: ALPRAZOLAM 0.5 MG TABS (ALPRAZOLAM) 1 by mouth once daily as needed severe anxiety Prescriptions: ALPRAZOLAM 0.5 MG TABS (ALPRAZOLAM) 1 by mouth once daily as needed severe anxiety  #15 x 0   Entered and Authorized by:   Judith Part MD   Signed by:   Lewanda Rife LPN on 13/24/4010   Method used:   Telephoned to ...       [facility] [phone]* (retail)       [address] [place], [place]  [address]       Ph: [phone] or [phone]       Fax: [phone]   RxID:   [id number]

## 2010-04-21 NOTE — Assessment & Plan Note (Signed)
Summary: 4WK FOLLOW UP / LFW   Vital Signs:  Patient profile:   70 year old male Height:      68 inches Weight:      202.50 pounds BMI:     30.90 O2 Sat:      96 % on Room air Temp:     98.2 degrees F oral Pulse rate:   84 / minute Pulse rhythm:   regular BP sitting:   134 / 74  (left arm) Cuff size:   regular  Vitals Entered By: Lewanda Rife LPN (December 12, 2009 12:00 PM)  O2 Flow:  Room air CC: 4 week f/u   History of Present Illness: here for f/u of bronchitis / copd and also anxiety  pt was inst to start paxil for ongoing anx but he declined saying he wanted as needed med only  thinks he only needs med several times per mo  given small # of xanax  had a bad bout of bronchitis and flare of copd is almost better  saw pulm- Dr [name] is on spiriva and advair and as needed bronchodialators also tx with doxy  nl ambulatory pulse ox PFTs to follow --next month   smoking status -- is a little better - plans to set a quit   is in the donut hole and needs samples   with anxiety -- has taken 7 pills so far of alprazolam  has taken for sleep as well  the sob causes anxiety  this is new for him - was never anxious before  the prostate cancer does not really bother him     Allergies: 1)  ! Sulfa  Past History:  Past Medical History: Last updated: 10/28/2009 COPD Coronary artery disease Hyperlipidemia Myocardial infarction, hx of Osteoarthritis Peripheral vascular disease 1. Coronary artery disease status post prior stenting of the left     anterior descending for an anterior wall infarction in 1997 with 2     Palmaz-Schatz stent, status post prior stenting of the circumflex     artery with a bare-metal stent in 2002, and recent placement of 2     non-overlapping drug-eluting stents in the proximal and mid left     anterior descending. 2. Good left ventricular function. 3. Hyperlipidemia. 4. Hypertension. 5. Status post carotid enterectomy. 6. Cigarette  use. 7. Chronic bronchitis.  prostate cancer   urol- [name]  Past Surgical History: Last updated: 10/25/2007 2 stents placed - LAD Pneumonia (1995) Mass on left neck- CT neg, fatty tissue MI-PTCA- stent (1997) Echocardiogram- neg (1995) MI, inferior, PTCA, stent (10/1998) Carotid doppler- 60-79% ICA stenosis  (12/1999) Right carotid endarterectomy (03/2000) Colonoscopy- polyps, hemorrhoids (04/2001) Cardiolite- old MI EF 51% (07/1999) Carotid doppler- clear right, 0-39 % left (09/2004) Pneumonia- hosp (04/2006) Nuclear stress test- low risk study (10/208) 6/09 hosp CAD-- cath with PTCA  Family History: Last updated: 03-07-2007 Father: deceased- CVA Mother: pacemaker Siblings: brother deceased from non cancerous brain tumor  Social History: Last updated: 10/28/2009 Marital Status: Married Children: 5, 3 stepchildren 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 Eyes:  Denies blurring. CV:  Denies chest pain or discomfort, lightheadness, palpitations, and shortness of breath with exertion. Resp:  Complains of cough, shortness of breath, and wheezing; denies sputum productive. GI:  Denies diarrhea, nausea, and vomiting. Derm:  Denies itching, lesion(s), poor wound  healing, and rash. Neuro:  Denies numbness and tingling. Psych:  Complains of anxiety; denies depression, irritability, panic attacks, sense of great danger, and suicidal thoughts/plans. Endo:  Denies excessive thirst and excessive urination. Heme:  Denies abnormal bruising and bleeding.  Physical Exam  Lungs:  scant exp wheeze diffusely prolonged exp phase no rales or rhonchi no sob Psych:  seems overall not anxious today   Impression & Recommendations:  Problem # 1:  CHRONIC OBSTRUCTIVE PULMONARY DISEASE,  ACUTE EXACERBATION (ICD-491.21) Assessment Improved this is imp on current med still unable to convince him to quit smoking  discussed in detail risks of smoking, and possible outcomes including COPD, vascular dz, cancer and also respiratory infections/sinus problems   given samples of advair 250- in donut hole ( I know this is not his dose but told to watch out for any worsening) he cannot afford it otherwise   Problem # 2:  ANXIETY (ICD-300.00) Assessment: New disc this in detail - with stressors and symptoms pt declines counseling ref for this or smoking cessation declines ssri promises to use the alprazolam once weekly or less- otherwise disc other med rev addictive potential of this  The following medications were removed from the medication list:    Paxil 10 Mg Tabs (Paroxetine hcl) .Marland Kitchen... 1 by mouth once daily in evening His updated medication list for this problem includes:    Alprazolam 0.5 Mg Tabs (Alprazolam) .Marland Kitchen... 1 by mouth once daily as needed severe anxiety  Complete Medication List: 1)  Nitroglycerin 0.4 Mg Subl (Nitroglycerin) .... Place 1 tablet under tongue as directed 2)  Metoprolol Succinate 25 Mg Xr24h-tab (Metoprolol succinate) .... Take 1 tablet by mouth once a day' 3)  Quinapril Hcl 10 Mg Tabs (Quinapril hcl) .... Take 1 tab every other day 4)  Plavix 75 Mg Tabs (Clopidogrel bisulfate) .... Take 1 tablet by mouth once a day 5)  Aspirin 325 Mg Tabs (Aspirin) .... Take 1 by mouth once daily 6)  Ventolin Hfa 108 (90 Base) Mcg/act Aers (Albuterol sulfate) .... 2 puffs every 4 hrs as needed wheezing 7)  Advair Diskus 500-50 Mcg/dose Misc (Fluticasone-salmeterol) .Marland Kitchen.. 1 puff two times a day for wheezing 8)  Duoneb 0.5-2.5 (3) Mg/79ml Soln (Ipratropium-albuterol) .... Use as directed one treatment every 6 hours as needed 9)  Tamsulosin Hcl 0.4 Mg Caps (Tamsulosin hcl) .... Every other day 10)  Alprazolam 0.5 Mg Tabs (Alprazolam) .Marland Kitchen.. 1 by mouth once daily as needed  severe anxiety 11)  Spiriva Handihaler 18 Mcg Caps (Tiotropium bromide monohydrate) .Marland Kitchen.. 1 inhalation once daily as directed 12)  Diclofenac Potassium 50 Mg Tabs (Diclofenac potassium) .Marland Kitchen.. 1 by mouth daily as needed 13)  Ibuprofen 200 Mg Tabs (Ibuprofen) .... Otc as directed.  Patient Instructions: 1)  if you need alprazolam more than once a week- we need to discuss daily med 2)  have pharmacy call when you run out  3)  this advair sample is 250- not 500 -- keep that in mind 4)  seriously think about quitting smoking   Current Allergies (reviewed today): ! SULFA

## 2010-04-21 NOTE — Assessment & Plan Note (Signed)
Summary: F/U [facility] ON 01/21/10/CLE   Vital Signs:  Patient profile:   70 year old male Height:      68 inches Weight:      204.75 pounds BMI:     31.24 O2 Sat:      94 % on Room air Temp:     98.2 degrees F oral Pulse rate:   92 / minute Pulse rhythm:   regular BP sitting:   142 / 76  (left arm) Cuff size:   large  Vitals Entered By: Lewanda Rife LPN (January 26, 2010 12:44 PM)  O2 Flow:  Room air CC: f/u from [place] [facility] on 01/21/10   History of Present Illness: here for f/u from [facility] ER 11/2 had exac of copd and poss bronchitis  was taken to hosp by ems and needed breathing tx  had developed runny nose and cold symptoms several days before - kept going to work  on a wed -- got worse as day went on - finally called fire dept (they gave him 02)    d/c on pred taper and also doxycycline  iv steroids  cxr showed copd and no acute changes   finished pred taper  has 3 more days of doxy  breathing is improved -- not 100% baseline yet   current meds adviar/ duoneb/ spiriva  smoking status  down to 4 cig per day  now off of cig for 5 days  has patch on   is really hoarse and hard time coughing up mucous     Allergies: 1)  ! Sulfa  Past History:  Past Medical History: Last updated: 10/28/2009 COPD Coronary artery disease Hyperlipidemia Myocardial infarction, hx of Osteoarthritis Peripheral vascular disease 1. Coronary artery disease status post prior stenting of the left     anterior descending for an anterior wall infarction in 1997 with 2     Palmaz-Schatz stent, status post prior stenting of the circumflex     artery with a bare-metal stent in 2002, and recent placement of 2     non-overlapping drug-eluting stents in the proximal and mid left     anterior descending. 2. Good left ventricular function. 3. Hyperlipidemia. 4. Hypertension. 5. Status post carotid enterectomy. 6. Cigarette use. 7. Chronic bronchitis.  prostate cancer   urol-  [name]  Past Surgical History: Last updated: 10/25/2007 2 stents placed - LAD Pneumonia (1995) Mass on left neck- CT neg, fatty tissue MI-PTCA- stent (1997) Echocardiogram- neg (1995) MI, inferior, PTCA, stent (10/1998) Carotid doppler- 60-79% ICA stenosis  (12/1999) Right carotid endarterectomy (03/2000) Colonoscopy- polyps, hemorrhoids (04/2001) Cardiolite- old MI EF 51% (07/1999) Carotid doppler- clear right, 0-39 % left (09/2004) Pneumonia- hosp (04/2006) Nuclear stress test- low risk study (10/208) 6/09 hosp CAD-- cath with PTCA  Family History: Last updated: 03-08-07 Father: deceased- CVA Mother: pacemaker Siblings: brother deceased from non cancerous brain tumor  Social History: Last updated: 10/28/2009 Marital Status: Married Children: 5, 3 stepchildren 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 Eyes:  Denies blurring and eye irritation. ENT:  Complains of postnasal drainage; denies sinus pressure and sore throat. CV:  Denies chest pain or discomfort, lightheadness, and palpitations. Resp:  Complains of cough, sputum productive, and wheezing; denies pleuritic and shortness of breath. GI:  Denies gas, indigestion, and nausea.  Derm:  Denies itching, lesion(s), poor wound healing, and rash. Neuro:  Denies headaches. Psych:  mood is ok . Endo:  Denies cold intolerance, excessive urination, heat intolerance, and polyuria. Heme:  Denies abnormal bruising and bleeding.  Physical Exam  General:  overweight but generally well appearing not sob at rest  Head:  normocephalic, atraumatic, and no abnormalities observed.  no sinus tenderness  Eyes:  vision grossly intact, pupils equal, pupils round, and pupils reactive to light.  no conjunctival  pallor, injection or icterus  Ears:  R ear normal and L ear normal.   Nose:  no nasal discharge.  - nares are boggy with some clear rhinorrhea  Mouth:  pharynx pink and moist.   Neck:  supple with full rom and no masses or thyromegally, no JVD or carotid bruit  Chest Wall:  No deformities, masses, tenderness or gynecomastia noted. Lungs:  scant exp wheeze diffusely prolonged exp phase no rales or rhonchi no sob Heart:  Normal rate and regular rhythm. S1 and S2 normal without gallop, murmur, click, rub or other extra sounds. Skin:  Intact without suspicious lesions or rashes Cervical Nodes:  No lymphadenopathy noted Psych:  normal affect, talkative and pleasant    Impression & Recommendations:  Problem # 1:  CHRONIC OBSTRUCTIVE PULMONARY DISEASE, ACUTE EXACERBATION (ICD-491.21) Assessment Deteriorated this is much improved after course of prednisone  rev hospital records in detail today suspect had bronchitis  can use mucinex as needed to loosen phlegm  will finish doxycycline adv to continue inhalers as adv by pulm will get pneumovax in 2 weeks when totally better  congrat on quitting smoking  Problem # 2:  Hx of TOBACCO USE, QUIT (ICD-V15.82) Assessment: Improved doing ok with nicotine patch now  is proud - day 5 and thinks he will do ok  disc expectations   Problem # 3:  ANXIETY (ICD-300.00) Assessment: Improved is occ exac by sob but doing better refilled alprazolam for as needed use His updated medication list for this problem includes:    Alprazolam 0.5 Mg Tabs (Alprazolam) .Marland Kitchen... 1 by mouth once daily as needed severe anxiety  Complete Medication List: 1)  Nitroglycerin 0.4 Mg Subl (Nitroglycerin) .... Place 1 tablet under tongue as directed 2)  Metoprolol Succinate 25 Mg Xr24h-tab (Metoprolol succinate) .... Take 1 tablet by mouth once a day' 3)  Quinapril Hcl 10 Mg Tabs (Quinapril hcl) .... Take 1 tab every other day 4)  Plavix 75 Mg Tabs (Clopidogrel bisulfate)  .... Take 1 tablet by mouth once a day 5)  Aspirin 325 Mg Tabs (Aspirin) .... Take 1 by mouth once daily 6)  Ventolin Hfa 108 (90 Base) Mcg/act Aers (Albuterol sulfate) .... 2 puffs every 4 hrs as needed wheezing 7)  Advair Diskus 500-50 Mcg/dose Misc (Fluticasone-salmeterol) .Marland Kitchen.. 1 puff two times a day for wheezing 8)  Duoneb 0.5-2.5 (3) Mg/43ml Soln (Ipratropium-albuterol) .... Use as directed one treatment every 6 hours as needed 9)  Tamsulosin Hcl 0.4 Mg Caps (Tamsulosin hcl) .... Every other day 10)  Alprazolam 0.5 Mg Tabs (Alprazolam) .Marland Kitchen.. 1 by mouth once daily as needed severe anxiety 11)  Spiriva Handihaler 18 Mcg Caps (Tiotropium bromide monohydrate) .Marland Kitchen.. 1 inhalation once daily as directed 12)  Diclofenac Potassium 50 Mg Tabs (Diclofenac potassium) .Marland Kitchen.. 1 by mouth daily as needed 13)  Ibuprofen 200 Mg Tabs (Ibuprofen) .... Otc as directed. 14)  Doxycycline Hyclate 100 Mg Caps (Doxycycline hyclate) .... One capsule twice a day til finished  Patient Instructions: 1)  schedule nurse visit in 2 weeks for pneumovax please 2)  finish your doxycycline as directed  3)  continue your inhalers as directed  4)  if your symptoms worsen -- let me or pulmonary know  5)  congratulations on quitting smoking  Prescriptions: ALPRAZOLAM 0.5 MG TABS (ALPRAZOLAM) 1 by mouth once daily as needed severe anxiety  #15 x 0   Entered by:   Melody Comas   Authorized by:   Fay Bagg Part MD   Signed by:   Melody Comas on 01/26/2010   Method used:   Telephoned to ...       [facility] [phone][facility] (retail)       [address] [place], [place]  [address]       Ph: [phone] or [phone]       Fax: [phone]   RxID:   [id number] ALPRAZOLAM 0.5 MG TABS (ALPRAZOLAM) 1 by mouth once daily as needed severe anxiety  #15 x 0   Entered and Authorized by:   Lincon Sahlin Part MD   Signed by:   Davione Lenker Part MD on 01/26/2010   Method used:   Print then Give to Patient    RxID:   [id number]   Patient left rx for alprazolam at front desk by accident, rx destroyed. Alprazolam called to pharmacy.  Melody Comas  January 26, 2010 1:30 PM   Orders Added: 1)  Est. Patient Level IV [87564]    Current Allergies (reviewed today): ! SULFA

## 2010-04-21 NOTE — Progress Notes (Signed)
Summary: sob   Phone Note Call from Patient Call back at Home Phone [phone]   Caller: Patient Call For: Ian Part MD Summary of Call: Patient called saying that he has been having shortness of breath. When he lays down at night he gets shortness of breath and says he goes into panic mode. He says that he called his cardiologist and they said that they feel it may be copd related and he may need a different inhaler. He says that he is open to any suggestions. [facility] [address]  Initial call taken by: Melody Comas,  November 04, 2009 1:34 PM  Follow-up for Phone Call        I looked back at his records and in past pulm (dr Delford Field) had him on spiriva -- I will go ahead and add this back to add to other meds (px written on EMR for call in ) also I feel he should have a f/u with pulmonary at this point I think it has been a while - I will do a ref if fever/ cough - let me know  if severe after hours - go to ER Follow-up by: Ian Part MD,  November 04, 2009 1:43 PM  Additional Follow-up for Phone Call Additional follow up Details #1::        Patient notified as instructed by telephone. Pt will wait to hear from [facility] about pulmonary referral.Medication phoned to [facility] as instructed. Lewanda Rife LPN  November 04, 2009 2:49 PM     New/Updated Medications: SPIRIVA HANDIHALER 18 MCG CAPS (TIOTROPIUM BROMIDE MONOHYDRATE) 1 inhalation once daily as directed Prescriptions: SPIRIVA HANDIHALER 18 MCG CAPS (TIOTROPIUM BROMIDE MONOHYDRATE) 1 inhalation once daily as directed  #1 mdi x 1   Entered and Authorized by:   Ian Part MD   Signed by:   Lewanda Rife LPN on 73/22/0254   Method used:   Telephoned to ...       [facility] [phone]* (retail)       [address] [place], [place]  [address]       Ph: [phone] or [phone]       Fax: [phone]   RxID:   [id number]

## 2010-04-21 NOTE — Progress Notes (Signed)
Summary: SOB   Phone Note Call from Patient Call back at Home Phone [phone]   Caller: Patient Summary of Call: Pt having SOB Initial call taken by: Judie Grieve,  November 04, 2009 1:10 PM  Follow-up for Phone Call        Called patient back. He advised me that he is still having problems with SOB despite a tapered dose of Prednisone and 2 inhalers. He denies having any chest pains. He will call Dr.Tower for advise since he just saw her for this COPD issue on 10/27/2009. Follow-up by: Suzan Garibaldi RN

## 2010-04-21 NOTE — Progress Notes (Signed)
Summary: wants 12/15 rov w/ byrum  Phone Note Call from Patient Call back at [facility] Phone [phone]   Caller: Patient Call For: byrum Summary of Call: pt's spouse called yesterday for an appt w/ dr byrum asap. pt was seen today. however, when i scheduled pt yesterday i cancelled the appt for 12/15 (since spouse stated pt couldn't wait to be seen on the 15th). now spouse wants the rov on the 15th since that is pt's day off and "she did not ask that the appt for the 15th to be cancelled". she wasn't happy. i am sorry for this. pls advise. home # above or cell [phone]. note: they can wait until mon for call back per spouse.  Initial call taken by: Tivis Ringer, CNA,  February 20, 2010 5:02 PM  Follow-up for Phone Call        Lawson Fiscal, pls advise.  Thanks! Crystal Jones RN  February 20, 2010 5:08 PM   Pt and spouse aware original time slot has been filled but we will see him if he can be here on 03/05/2010 @ 1:30pm. Pt was fine with this and agreed.Michel Bickers CMA  February 20, 2010 5:25 PM

## 2010-04-21 NOTE — Assessment & Plan Note (Signed)
Summary: COUGH/CLE   Vital Signs:  Patient profile:   70 year old male Height:      68 inches Weight:      201.50 pounds BMI:     30.75 O2 Sat:      93 % on Room air Temp:     97.9 degrees F oral Pulse rate:   92 / minute Pulse rhythm:   regular Resp:     24 per minute BP sitting:   138 / 76  (left arm) Cuff size:   regular  Vitals Entered By: Lewanda Rife LPN (June 23, 1608 12:11 PM)  O2 Flow:  Room air CC: productive cough with white phlegm some of the time. Also shortness of breath on and off   History of Present Illness: came down with a cold 2-3 wk ago turned into bronchitis  sob and a little wheeze (worse than usual) white phlem  thinks he may need an oral steroid  using albuterol mdi - ventolin  uses advir two times a day  last 2 days using duo- neb as needed   is still smoking- has cut back and not quit 1/2- 3/4 pk per day is trying to quit - really wants to  has tried nictotine patch and chantix (side eff) -- bad / violent dreams   just went throught rad tx for prostate --feels fine with that - a real hassle  Allergies: 1)  ! Sulfa  Past History:  Past Surgical History: Last updated: 10/25/2007 2 stents placed - LAD Pneumonia (1995) Mass on left neck- CT neg, fatty tissue MI-PTCA- stent (1997) Echocardiogram- neg (1995) MI, inferior, PTCA, stent (10/1998) Carotid doppler- 60-79% ICA stenosis  (12/1999) Right carotid endarterectomy (03/2000) Colonoscopy- polyps, hemorrhoids (04/2001) Cardiolite- old MI EF 51% (07/1999) Carotid doppler- clear right, 0-39 % left (09/2004) Pneumonia- hosp (04/2006) Nuclear stress test- low risk study (10/208) 6/09 hosp CAD-- cath with PTCA  Family History: Last updated: 2007-02-25 Father: deceased- CVA Mother: pacemaker Siblings: brother deceased from non cancerous brain tumor  Social History: Last updated: February 25, 2007 Marital Status: Married Children: 5, 3 stepchildren Occupation: Theatre stage manager  Risk  Factors: Smoking Status: quit (2007-02-25)  Past Medical History: COPD Coronary artery disease Hyperlipidemia Myocardial infarction, hx of Osteoarthritis Peripheral vascular disease 1. Coronary artery disease status post prior stenting of the left     anterior descending for an anterior wall infarction in 1997 with 2     Palmaz-Schatz stent, status post prior stenting of the circumflex     artery with a bare-metal stent in 2002, and recent placement of 2     non-overlapping drug-eluting stents in the proximal and mid left     anterior descending. 2. Good left ventricular function. 3. Hyperlipidemia. 4. Hypertension. 5. Status post carotid enterectomy. 6. Cigarette use. 7. Chronic bronchitis.  prostate cancer   urol- Tannenbaum   Review of Systems General:  Complains of fatigue; denies chills, fever, loss of appetite, and malaise. Eyes:  Denies discharge and eye irritation. ENT:  Complains of nasal congestion and postnasal drainage; denies earache and sore throat. CV:  Denies chest pain or discomfort and palpitations. Resp:  Complains of cough, shortness of breath, sputum productive, and wheezing; denies pleuritic. GI:  Denies abdominal pain, change in bowel habits, nausea, and vomiting. GU:  Denies dysuria and incontinence. Derm:  Denies poor wound healing and rash. Heme:  Denies abnormal bruising and bleeding.  Physical Exam  General:  Well-developed,well-nourished,in no acute distress; alert,appropriate and cooperative throughout examination  Head:  normocephalic, atraumatic, and no abnormalities observed.  no sinus tenderness  Eyes:  vision grossly intact, pupils equal, pupils round, and pupils reactive to light.  no conjunctival pallor, injection or icterus  Ears:  R ear normal and L ear normal.   Nose:  nares congested with clear rhinorrhea Mouth:  pharynx pink and moist, no erythema, and no exudates.   Neck:  supple with full rom and no masses or thyromegally, no JVD  or carotid bruit  Chest Wall:  No deformities, masses, tenderness or gynecomastia noted. Lungs:  harsh bs at bases- which are also distant mildly prolonged exp phase mild exp wheeze throughout  no rales or rhonchi  is breathing comfortably Heart:  Normal rate and regular rhythm. S1 and S2 normal without gallop, murmur, click, rub or other extra sounds. Skin:  Intact without suspicious lesions or rashes Cervical Nodes:  No lymphadenopathy noted Psych:  normal affect, talkative and pleasant    Impression & Recommendations:  Problem # 1:  BRONCHITIS- ACUTE (ICD-466.0) Assessment New  acute after uri in smoker with copd  pred taper and zithromax to cover disc poss side eff inhalers as needed -- use duoneb at night update if fever/ worse sob or not imp in 1 wk His updated medication list for this problem includes:    Ventolin Hfa 108 (90 Base) Mcg/act Aers (Albuterol sulfate) .Marland Kitchen... 2 puffs every 4 hrs as needed wheezing    Advair Diskus 500-50 Mcg/dose Misc (Fluticasone-salmeterol) .Marland Kitchen... 1 puff two times a day for wheezing    Duoneb 0.5-2.5 (3) Mg/75ml Soln (Ipratropium-albuterol) ..... Use as directed    Zithromax Z-pak 250 Mg Tabs (Azithromycin) .Marland Kitchen... Take by mouth as directed  Orders: Prescription Created Electronically [phone])  Problem # 2:  Hx of TOBACCO ABUSE (ICD-305.1) Assessment: Unchanged  discussed in detail risks of smoking, and possible outcomes including COPD, vascular dz, cancer and also respiratory infections/sinus problems  pt voiced understanding has tried products to quit without success sounds open to trying cold [place]- but worried about anx disc poss use of ssri for this in future if he wants to and he will consider it  again stressed dire imp of quitting - esp in light of recent prostate ca dx and other health problems   Orders: Prescription Created Electronically [phone])  Complete Medication List: 1)  Nitroglycerin 0.4 Mg Subl (Nitroglycerin) .... Place  1 tablet under tongue as directed 2)  Metoprolol Succinate 25 Mg Xr24h-tab (Metoprolol succinate) .... Take 1 tablet by mouth once a day' 3)  Quinapril Hcl 10 Mg Tabs (Quinapril hcl) .... Take 1 tablet by mouth once a day 4)  Plavix 75 Mg Tabs (Clopidogrel bisulfate) .... Take 1 tablet by mouth once a day 5)  Aspirin 325 Mg Tabs (Aspirin) .... Take 1 by mouth once daily 6)  Ventolin Hfa 108 (90 Base) Mcg/act Aers (Albuterol sulfate) .... 2 puffs every 4 hrs as needed wheezing 7)  Advair Diskus 500-50 Mcg/dose Misc (Fluticasone-salmeterol) .Marland Kitchen.. 1 puff two times a day for wheezing 8)  Duoneb 0.5-2.5 (3) Mg/4ml Soln (Ipratropium-albuterol) .... Use as directed 9)  Tamsulosin Hcl 0.4 Mg Caps (Tamsulosin hcl) .... Take one daily by mouth 10)  Zithromax Z-pak 250 Mg Tabs (Azithromycin) .... Take by mouth as directed 11)  Prednisone 10 Mg Tabs (Prednisone) .... Take by mouth as directed  Patient Instructions: 1)  take prednisone as follows  2)  take 3 pills once daily for 3 days then 3)  2 pills once daily  for 3 days then 4)  1 pill once daily for 3 days then stop  5)  take the zithromax as directed  6)  think about quitting smoking cold [place]  7)  update me if not improving in next week or worse  8)  use your duoneb as needed  Prescriptions: PREDNISONE 10 MG TABS (PREDNISONE) take by mouth as directed  #18 x 0   Entered and Authorized by:   Judith Part MD   Signed by:   Judith Part MD on 06/23/2009   Method used:   [facility] to        [facility] [phone]* (retail)       [address] [place], [place]  [address]       Ph: [phone] or [phone]       Fax: [phone]   RxID:   [id number] ZITHROMAX Z-PAK 250 MG TABS (AZITHROMYCIN) take by mouth as directed  #1 pack x 0   Entered and Authorized by:   Judith Part MD   Signed by:   Judith Part MD on 06/23/2009   Method used:   [facility] to        [facility] [phone]*  (retail)       [address] [place], [place]  [address]       Ph: [phone] or [phone]       Fax: [phone]   RxID:   [id number]   Current Allergies (reviewed today): ! SULFA

## 2010-04-21 NOTE — Assessment & Plan Note (Signed)
Summary: COPD   Visit Type:  Initial Consult Copy to:  Dr. Milinda Antis Primary Provider/Referring Provider:  DR Jilda Panda creek [place]  CC:  Pulmonary consult...  Patient c/o prod and non-prod cough for 10 weeks...mucus is white to brown in color...WOB mostly with exertion and when lying down at night.  History of Present Illness: 70 yo smoker ( ~100pk-yrs), hx of CAD/PTCI, HTN,  prostate CA. Dx with COPD around 10 yrs ago. Last seen 2007 by PW. Maintanance was Advair bid. Previously on Spiriva, stopped in 2008.  Referred for worsening dyspnea, cough prod of dark phlegm by Dr Milinda Antis. She treated him for AE-COPD, didn't improve fully. Spiriva readded to Advair in 8/11. His breathing has improved some on the combination. Uses DuoNebs as needed, recently qam. Uses Ventolin occas. Able to work, paces himself. Sometimes limited by breathing w heavy exertion. Has had side effects from Chantix before.   Preventive Screening-Counseling & Management  Alcohol-Tobacco     Smoking Status: current     Smoking Cessation Counseling: yes     Smoke Cessation Stage: contemplative     Packs/Day: 0.75     Year Started: 1958     Pack years: 100     Tobacco Counseling: to quit use of tobacco products  Current Medications (verified): 1)  Nitroglycerin 0.4 Mg Subl (Nitroglycerin) .... Place 1 Tablet Under Tongue As Directed 2)  Metoprolol Succinate 25 Mg Xr24h-Tab (Metoprolol Succinate) .... Take 1 Tablet By Mouth Once A Day' 3)  Quinapril Hcl 10 Mg  Tabs (Quinapril Hcl) .... Take 1 Tab Every Other Day 4)  Plavix 75 Mg  Tabs (Clopidogrel Bisulfate) .... Take 1 Tablet By Mouth Once A Day 5)  Aspirin 325 Mg Tabs (Aspirin) .... Take 1 By Mouth Once Daily 6)  Ventolin Hfa 108 (90 Base) Mcg/act Aers (Albuterol Sulfate) .... 2 Puffs Every 4 Hrs As Needed Wheezing 7)  Advair Diskus 500-50 Mcg/dose Misc (Fluticasone-Salmeterol) .Marland Kitchen.. 1 Puff Two Times A Day For Wheezing 8)  Duoneb 0.5-2.5 (3) Mg/57ml Soln  (Ipratropium-Albuterol) .... Use As Directed One Treatment Every 6 Hours As Needed 9)  Tamsulosin Hcl 0.4 Mg Caps (Tamsulosin Hcl) .... Every Other Day 10)  Paxil 10 Mg Tabs (Paroxetine Hcl) .Marland Kitchen.. 1 By Mouth Once Daily in Evening 11)  Alprazolam 0.5 Mg Tabs (Alprazolam) .Marland Kitchen.. 1 By Mouth Once Daily As Needed Severe Anxiety 12)  Spiriva Handihaler 18 Mcg Caps (Tiotropium Bromide Monohydrate) .Marland Kitchen.. 1 Inhalation Once Daily As Directed 13)  Diclofenac Potassium 50 Mg Tabs (Diclofenac Potassium) .Marland Kitchen.. 1 By Mouth Daily As Needed  Allergies (verified): 1)  ! Sulfa  Past History:  Past Medical History: Last updated: 10/28/2009 COPD Coronary artery disease Hyperlipidemia Myocardial infarction, hx of Osteoarthritis Peripheral vascular disease 1. Coronary artery disease status post prior stenting of the left     anterior descending for an anterior wall infarction in 1997 with 2     Palmaz-Schatz stent, status post prior stenting of the circumflex     artery with a bare-metal stent in 2002, and recent placement of 2     non-overlapping drug-eluting stents in the proximal and mid left     anterior descending. 2. Good left ventricular function. 3. Hyperlipidemia. 4. Hypertension. 5. Status post carotid enterectomy. 6. Cigarette use. 7. Chronic bronchitis.  prostate cancer   urol- [name]  Past Surgical History: Last updated: 10/25/2007 2 stents placed - LAD Pneumonia (1995) Mass on left neck- CT neg, fatty tissue MI-PTCA- stent (1610) Echocardiogram- neg (1995)  MI, inferior, PTCA, stent (10/1998) Carotid doppler- 60-79% ICA stenosis  (12/1999) Right carotid endarterectomy (03/2000) Colonoscopy- polyps, hemorrhoids (04/2001) Cardiolite- old MI EF 51% (07/1999) Carotid doppler- clear right, 0-39 % left (09/2004) Pneumonia- hosp (04/2006) Nuclear stress test- low risk study (10/208) 6/09 hosp CAD-- cath with PTCA  Family History: Last updated: 13-Feb-2007 Father: deceased-  CVA Mother: pacemaker Siblings: brother deceased from non cancerous brain tumor  Social History: Last updated: 10/28/2009 Marital Status: Married Children: 5, 3 stepchildren 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  The patient denies coughing up blood, chest pain, irregular heartbeats, acid heartburn, indigestion, loss of appetite, weight change, difficulty swallowing, sore throat, tooth/dental problems, headaches, nasal congestion/difficulty breathing through nose, sneezing, itching, ear ache, anxiety, depression, hand/feet swelling, rash, and fever.    Vital Signs:  Patient profile:   70 year old male Height:      68 inches (172.72 cm) Weight:      202 pounds (91.82 kg) BMI:     30.83 O2 Sat:      95 % on Room air Temp:     98.5 degrees F (36.94 degrees C) oral Pulse rate:   86 / minute BP sitting:   142 / 70  (right arm) Cuff size:   regular  Vitals Entered By: Michel Bickers CMA (November 21, 2009 1:54 PM)  O2 Sat at Rest %:  95 O2 Flow:  Room air CC: Pulmonary consult...  Patient c/o prod and non-prod cough for 10 weeks...mucus is white to brown in color...WOB mostly with exertion and when lying down at night Comments Medications reviewed with the patient. Daytime phone verified. Michel Bickers [facility]  November 21, 2009 2:15 PM   Physical Exam  General:  normal appearance and healthy appearing.   Head:  normocephalic and atraumatic Eyes:  conjunctiva and sclera clear Nose:  no deformity, discharge, inflammation, or lesions Mouth:  no deformity or lesions Neck:  no masses, thyromegaly, or abnormal cervical nodes Lungs:  distant, few scattered L exp wheezes, no  crackles Heart:  regular rate and rhythm, S1, S2 without murmurs, rubs, gallops, or clicks Abdomen:  not examined Msk:  no deformity or scoliosis noted with normal posture Extremities:  trace ankle edema Neurologic:  non-focal Skin:  intact without lesions or rashes Psych:  alert and cooperative; normal mood and affect; normal attention span and concentration   Impression & Recommendations:  Problem # 1:  COPD (ICD-496) - treat residual bronchitis w doxycycline  - Spiriva + Advair - as needed ventolin or duonebs - full PFT - walking oximetry  Problem # 2:  Hx of TOBACCO ABUSE (ICD-305.1) - discussed tobacco cessation in detail, strategies, the decision to set a quit date.   Medications Added to Medication List This Visit: 1)  Diclofenac Potassium 50 Mg Tabs (Diclofenac potassium) .Marland Kitchen.. 1 by mouth daily as needed 2)  Doxycycline Hyclate 100 Mg Caps (Doxycycline hyclate) .Marland Kitchen.. 1 by mouth two times a day  Other Orders: Consultation Level IV (60454)  Patient Instructions: 1)  Continue your Spiriva and Advair as you are taking them 2)  Use either Ventolin or DuoNebs as needed, not more frequently than every  6 hours.  3)  Doxycycline x 7 days 4)  We will perform full pulmonary function testing at your next appointment 5)  We will perform walking oximetry today.  6)  Follow up with Dr [name] next available Prescriptions: DOXYCYCLINE HYCLATE 100 MG CAPS (DOXYCYCLINE HYCLATE) 1 by mouth two times a day  #14 x 0   Entered and Authorized by:   Leslye Peer MD   Signed by:   Leslye Peer MD on 11/21/2009   Method used:   [facility]        [facility] [phone]* (retail)       [address] [place], [place]  [address]       Ph: [phone] or [phone]       Fax: [phone]   RxID:   [id number]   Appended Document: COPD  Ambulatory Pulse Oximetry  Resting; HR_75____    02 Sat__96%RA___  Lap1 (185 feet)   HR__94___   02  Sat_96%ra____ Lap2 (185 feet)   HR__97___   02 Sat_95%ra____    Lap3 (185 feet)   HR__100___   02 Sat__93%ra___  __x_Test Completed without Difficulty ___Test Stopped due to:  Carver Fila  November 21, 2009 3:31 PM     Clinical Lists Changes

## 2010-04-21 NOTE — Assessment & Plan Note (Signed)
Summary: COPD   Visit Type:  Follow-up Copy to:  Dr. Milinda Antis Primary Provider/Referring Provider:  DR Jilda Panda creek [place]  CC:  Follow up with PFT. Pt states Spiriva has helped breathing and is requesting samples if going to continue same due to being in "donut hole". Breathing is the same and worse on days that have been humid . Pt requesting flu vaccine.  History of Present Illness: 70 yo smoker ( ~100pk-yrs), hx of CAD/PTCI, HTN,  prostate CA. Dx with COPD around 10 yrs ago. Last seen 2007 by PW. Maintanance was Advair bid. Previously on Spiriva, stopped in 2008.  Referred for worsening dyspnea, cough prod of dark phlegm by Dr Milinda Antis. She treated him for AE-COPD, didn't improve fully. Spiriva readded to Advair in 8/11. His breathing has improved some on the combination. Uses DuoNebs as needed, recently qam. Uses Ventolin occas. Able to work, paces himself. Sometimes limited by breathing w heavy exertion. Has had side effects from Chantix before.   ROV 01/02/10 -- follows up for his dyspnea and COPD. He believes that Spiriva has helped his breathing, was added to Advair just before I saw him. Also gave doxy to treat a residual bronchitis. His cough may be a bit better, although still makes mucous every day. Had PFT today, FEV1 47% predicted. Still smoking 1/2 pk day, wants to try to stop by Christmas or New Years Day.   Preventive Screening-Counseling & Management  Alcohol-Tobacco     Alcohol drinks/day: <1     Alcohol type: spirits     Smoking Status: current     Packs/Day: 0.75     Year Started: 1958     Pack years: 100  Current Medications (verified): 1)  Nitroglycerin 0.4 Mg Subl (Nitroglycerin) .... Place 1 Tablet Under Tongue As Directed 2)  Metoprolol Succinate 25 Mg Xr24h-Tab (Metoprolol Succinate) .... Take 1 Tablet By Mouth Once A Day' 3)  Quinapril Hcl 10 Mg  Tabs (Quinapril Hcl) .... Take 1 Tab Every Other Day 4)  Plavix 75 Mg  Tabs (Clopidogrel Bisulfate) ....  Take 1 Tablet By Mouth Once A Day 5)  Aspirin 325 Mg Tabs (Aspirin) .... Take 1 By Mouth Once Daily 6)  Ventolin Hfa 108 (90 Base) Mcg/act Aers (Albuterol Sulfate) .... 2 Puffs Every 4 Hrs As Needed Wheezing 7)  Advair Diskus 500-50 Mcg/dose Misc (Fluticasone-Salmeterol) .Marland Kitchen.. 1 Puff Two Times A Day For Wheezing 8)  Duoneb 0.5-2.5 (3) Mg/15ml Soln (Ipratropium-Albuterol) .... Use As Directed One Treatment Every 6 Hours As Needed 9)  Tamsulosin Hcl 0.4 Mg Caps (Tamsulosin Hcl) .... Every Other Day 10)  Alprazolam 0.5 Mg Tabs (Alprazolam) .Marland Kitchen.. 1 By Mouth Once Daily As Needed Severe Anxiety 11)  Spiriva Handihaler 18 Mcg Caps (Tiotropium Bromide Monohydrate) .Marland Kitchen.. 1 Inhalation Once Daily As Directed 12)  Diclofenac Potassium 50 Mg Tabs (Diclofenac Potassium) .Marland Kitchen.. 1 By Mouth Daily As Needed 13)  Ibuprofen 200 Mg Tabs (Ibuprofen) .... Otc As Directed.  Allergies (verified): 1)  ! Sulfa  Social History: Alcohol drinks/day:  <1  Vital Signs:  Patient profile:   70 year old male Height:      68 inches Weight:      203 pounds BMI:     30.98 O2 Sat:      95 % on Room air Temp:     98.1 degrees F oral Pulse rate:   77 / minute BP sitting:   132 / 68  (right arm) Cuff size:   large  Vitals Entered By: Zackery Barefoot CMA (January 02, 2010 10:46 AM)  O2 Flow:  Room air CC: Follow up with PFT. Pt states Spiriva has helped breathing and is requesting samples if going to continue same due to being in "donut hole". Breathing is the same, worse on days that have been humid . Pt requesting flu vaccine Comments Medications reviewed with patient Verified contact number and pharmacy with patient Zackery Barefoot CMA  January 02, 2010 10:47 AM    Physical Exam  General:  normal appearance and healthy appearing.   Head:  normocephalic and atraumatic Eyes:  conjunctiva and sclera clear Nose:  no deformity, discharge, inflammation, or lesions Mouth:  no deformity or lesions Neck:  no masses,  thyromegaly, or abnormal cervical nodes Lungs:  distant, B exp wheezes Heart:  regular rate and rhythm, S1, S2 without murmurs, rubs, gallops, or clicks Abdomen:  not examined Msk:  no deformity or scoliosis noted with normal posture Extremities:  trace ankle edema Neurologic:  non-focal Skin:  intact without lesions or rashes Psych:  alert and cooperative; normal mood and affect; normal attention span and concentration   Pulmonary Function Test Date: 01/02/2010 Height (in.): 69 Gender: Male  Pre-Spirometry FVC    Value: 2.76 L/min   Pred: 4.25 L/min     % Pred: 65 % FEV1    Value: 1.36 L     Pred: 2.88 L     % Pred: 47 % FEV1/FVC  Value: 49 %     Pred: 69 %    FEF 25-75  Value: 0.43 L/min   Pred: 2.65 L/min     % Pred: 16 %  Post-Spirometry FVC    Value: 2.59 L/min   Pred: 4.26 L/min     % Pred: 61 % FEV1    Value: 1.38 L     Pred: 2.88 L     % Pred: 48 % FEV1/FVC  Value: 53 %     Pred: 69 %    FEF 25-75  Value: 0.55 L/min   Pred: 2.65 L/min     % Pred: 21 %  Lung Volumes TLC    Value: 6.76 L   % Pred: 108 % RV    Value: 4.00 L   % Pred: 163 % DLCO    Value: 14.0 %   % Pred: 62 % DLCO/VA  Value: 3.39 %   % Pred: 98 %  Comments: Moderately severe AFL, hyperinflation, cdecreased DLCO that corrects for Va. RSB  Impression & Recommendations:  Problem # 1:  COPD (ICD-496) Continue your Spiriva once daily  We will try stopping your Advair for 2 weeks to see if you miss this medication. If you are breathing well then we will stop it. If you miss the medication then we will restart. Please call our office to let us know how you do.  Flu Shot today We discussed smoking today. We will aim at stopping around New Year's Day.  Follow up with Dr [name] in December or as needed   Problem # 2:  Hx of TOBACCO ABUSE (ICD-305.1)  Patient Instructions: 1)  Continue your Spiriva once daily  2)  We will try stopping your Advair for 2 weeks to see if you miss this medication. If you are  breathing well then we will stop it. If you miss the medication then we will restart. Please call our office to let us know how you do.  3)  Flu Shot today 4)  We discussed smoking  today. We will aim at stopping around New Year's Day.  5)  Follow up with Dr [name] in December or as needed

## 2010-04-21 NOTE — Letter (Signed)
Summary: [facility]  [facility]   Imported By: Lanelle Bal 03/26/2009 10:26:25  _____________________________________________________________________  External Attachment:    Type:   Image     Comment:   External Document

## 2010-04-21 NOTE — Progress Notes (Signed)
Summary: refill**pt is out**   Phone Note Refill Request Message from:  Patient on June 12, 2009 4:08 PM  Refills Requested: Medication #1:  QUINAPRIL HCL 10 MG  TABS Take 1 tablet by mouth once a day   [facility] [address] [facility] [place]   Method Requested: Fax to Local Pharmacy Initial call taken by: Migdalia Dk,  June 12, 2009 4:09 PM  Follow-up for Phone Call        [facility]  30 x6 refills Follow-up by: Oswald Hillock,  June 12, 2009 4:16 PM    Prescriptions: QUINAPRIL HCL 10 MG  TABS (QUINAPRIL HCL) Take 1 tablet by mouth once a day  #32 x 5   Entered by:   Oswald Hillock   Authorized by:   Lenoria Farrier, MD, [facility]   Signed by:   Oswald Hillock on 06/12/2009   Method used:   [facility]ronically to        [facility] [phone]* (retail)       [address] [place], [place]  [address]       Ph: [phone] or [phone]       Fax: [phone]   RxID:   [id number]

## 2010-04-21 NOTE — Assessment & Plan Note (Signed)
Summary: NOT BREATHING WELL   Vital Signs:  Patient profile:   70 year old male Height:      68 inches Weight:      201.25 pounds BMI:     30.71 O2 Sat:      93 % on Room air Temp:     98.0 degrees F oral Pulse rate:   76 / minute Pulse rhythm:   regular BP sitting:   140 / 80  (left arm) Cuff size:   regular  Vitals Entered By: Linde Gillis CMA Duncan Dull) (October 28, 2009 8:07 AM)  O2 Flow:  Room air CC: not breathing well   History of Present Illness: thinks the weather is worsening his breathing  works some outdoors making deliveries  is having some wheezing - wonders if he needs prednisone is breathing quickly -- sob / esp outdoors  coughs a little - mucous is clear to brown  does not feel sick    is down to less than 1/2 pack on cigarettes  no quit date planned  gets edgy without cig  has tried patches - helped a little  when he quit in the past  chantix - side eff  is ready to lie them down  thinks he will need some anx med   is overdue for chol check - cardiol does not watch this  he cannot tol meds  is watching diet for sat fats    Allergies: 1)  ! Sulfa  Past History:  Past Surgical History: Last updated: 10/25/2007 2 stents placed - LAD Pneumonia (1995) Mass on left neck- CT neg, fatty tissue MI-PTCA- stent (1997) Echocardiogram- neg (1995) MI, inferior, PTCA, stent (10/1998) Carotid doppler- 60-79% ICA stenosis  (12/1999) Right carotid endarterectomy (03/2000) Colonoscopy- polyps, hemorrhoids (04/2001) Cardiolite- old MI EF 51% (07/1999) Carotid doppler- clear right, 0-39 % left (09/2004) Pneumonia- hosp (04/2006) Nuclear stress test- low risk study (10/208) 6/09 hosp CAD-- cath with PTCA  Family History: Last updated: 03-08-07 Father: deceased- CVA Mother: pacemaker Siblings: brother deceased from non cancerous brain tumor  Social History: Last updated: 10/28/2009 Marital Status: Married Children: 5, 3 stepchildren 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 Coronary artery disease status post prior stenting of the left     anterior descending for an anterior wall infarction in 1997 with 2     Palmaz-Schatz stent, status post prior stenting of the circumflex     artery with a bare-metal stent in 2002, and recent placement of 2     non-overlapping drug-eluting stents in the proximal and mid left     anterior descending. 2. Good left ventricular function. 3. Hyperlipidemia. 4. Hypertension. 5. Status post carotid enterectomy. 6. Cigarette use. 7. Chronic bronchitis.  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 Eyes:  Denies blurring and eye irritation. CV:  Complains of shortness of breath with exertion; denies chest pain or discomfort, lightheadness, palpitations, and swelling of feet. Resp:  Complains of cough, shortness of breath, sputum productive, and wheezing; denies pleuritic. GI:  Denies abdominal pain, change in bowel habits, and indigestion. GU:  doing well after prostate ca. Derm:  Denies itching, lesion(s),  poor wound healing, and rash. Neuro:  Denies numbness and tingling. Endo:  Denies cold intolerance and heat intolerance.  Physical Exam  General:  overweight but generally well appearing not sob at rest  Head:  normocephalic, atraumatic, and no abnormalities observed.  no sinus tenderness  Eyes:  vision grossly intact, pupils equal, pupils round, and pupils reactive to light.  no conjunctival pallor, injection or icterus  Mouth:  pharynx pink and moist.   Neck:  supple with  full rom and no masses or thyromegally, no JVD or carotid bruit  Lungs:  harsh and distant bs with end exp wheeze (worse on forced exp)  no rales or rhonchi Heart:  Normal rate and regular rhythm. S1 and S2 normal without gallop, murmur, click, rub or other extra sounds. Abdomen:  Bowel sounds positive,abdomen soft and non-tender without masses, organomegaly or hernias noted. no renal bruits  Msk:  No deformity or scoliosis noted of thoracic or lumbar spine.   Pulses:  R and L carotid,radial,femoral,dorsalis pedis and posterior tibial pulses are full and equal bilaterally Extremities:  no CCE Neurologic:  sensation intact to light touch, gait normal, and DTRs symmetrical and normal.   Skin:  Intact without suspicious lesions or rashes Cervical Nodes:  No lymphadenopathy noted Psych:  normal affect, talkative and pleasant    Impression & Recommendations:  Problem # 1:  CHRONIC OBSTRUCTIVE PULMONARY DISEASE, ACUTE EXACERBATION (ICD-491.21) Assessment Deteriorated exac of copd - weather triggered  again disc smok cessation  tx with 40 mg pred taper - and disc side eff  cxr today f/u 4-6 wk  continue current inhalers  update if worse or any s/s of infection (reviewed) T-2 View CXR (71020TC)  Problem # 2:  Hx of TOBACCO ABUSE (ICD-305.1) Assessment: Unchanged discussed in detail risks of smoking, and possible outcomes including COPD, vascular dz, cancer and also respiratory infections/sinus problems   pt voiced understanding and need to quit (has quit in the past) will try paxil to handle the anx - and nicotine repl if needed  urged to set quit date f/u in 4-6 weeks  Orders: T-2 View CXR (71020TC) Prescription Created Electronically [phone])  Problem # 3:  HYPERLIPIDEMIA (ICD-272.4) Assessment: Unchanged  overdue for check with CAD is non tol to meds low sat fat diet - rev this  Orders: Venipuncture (60454) TLB-Lipid Panel (80061-LIPID) TLB-ALT (SGPT) (84460-ALT) TLB-AST  (SGOT) (84450-SGOT) Prescription Created Electronically [phone])  Labs Reviewed: SGOT: 16 (05/03/2008)   SGPT: 15 (05/03/2008)   HDL:41.7 (05/03/2008), 47.7 (07/14/2007)  LDL:89 (05/03/2008), 104 (91/47/8295)  Chol:144 (05/03/2008), 162 (07/14/2007)  Trig:66 (05/03/2008), 50 (07/14/2007)  Complete Medication List: 1)  Nitroglycerin 0.4 Mg Subl (Nitroglycerin) .... Place 1 tablet under tongue as directed 2)  Metoprolol Succinate 25 Mg Xr24h-tab (Metoprolol succinate) .... Take 1 tablet by mouth once a day' 3)  Quinapril Hcl 10 Mg Tabs (Quinapril hcl) .... Take 1 tab every other day 4)  Plavix 75 Mg Tabs (Clopidogrel bisulfate) .... Take 1 tablet by mouth once a day 5)  Aspirin 325 Mg Tabs (Aspirin) .... Take 1 by mouth once daily 6)  Ventolin Hfa 108 (90 Base) Mcg/act Aers (Albuterol sulfate) .... 2 puffs every 4 hrs as needed wheezing 7)  Advair Diskus 500-50 Mcg/dose Misc (Fluticasone-salmeterol) .Marland Kitchen.. 1 puff two times a day for wheezing 8)  Duoneb 0.5-2.5 (3) Mg/28ml Soln (Ipratropium-albuterol) .... Use as directed one treatment every 6 hours as needed 9)  Tamsulosin Hcl 0.4 Mg Caps (Tamsulosin hcl) .... Every other day 10)  Prednisone 10 Mg Tabs (Prednisone) .... Take by mouth as directed 11)  Paxil 10 Mg Tabs (Paroxetine hcl) .Marland Kitchen.. 1 by mouth once daily in evening  Patient Instructions: 1)  prednisone directions are as follows -  2)  take 4 by mouth once daily for 3 days, then 3 daily for 3 days then 2 daily for r 3 days then 1 daily for 3 days then stop  3)  continue your advair  4)  set a quit date for cigarettes and continue cutting down until then  5)  chest x ray on way out today  6)  when done with the prednisone - start paxil 10 mg daily-- this is for anxiety -- if any symptoms of depression start the medicine  7)  hopefully this will help you quit smoking  8)  nicotine replacement products otc are also ok  9)  follow up with me in 4-6 weeks  10)  labs for cholesterol  today Prescriptions: PAXIL 10 MG TABS (PAROXETINE HCL) 1 by mouth once daily in evening  #30 x 11   Entered and Authorized by:   Judith Part MD   Signed by:   Judith Part MD on 10/28/2009   Method used:   [facility] to        [facility] [phone]* (retail)       [address] [place], [place]  [address]       Ph: [phone] or [phone]       Fax: [phone]   RxID:   [id number] PREDNISONE 10 MG TABS (PREDNISONE) take by mouth as directed  #30 x 0   Entered and Authorized by:   Judith Part MD   Signed by:   Judith Part MD on 10/28/2009   Method used:   Electronically to        [facility] [phone]* (retail)       [address] [place], [place]  [address]       Ph: [phone] or [phone]       Fax: [phone]   RxID:   [id number]   Current Allergies (reviewed today): ! SULFA

## 2010-04-21 NOTE — Progress Notes (Signed)
Summary: Update on pt condition  Phone Note Call from Patient   Caller: Patient Call For: Judith Part MD Summary of Call: Pt came in today for nurse visit for Pneumovax. Pt saw Dr Milinda Antis on 01/26/10 for f/u for ER visit on 01/21/10 with COPD & bronchitis. I asked pt before giving the pneumovax if he still had any symptoms. Pt said he still has some chest congestion and productive cough with frothy white phlegm to brown phlegm. On and off pt has trouble getting his breath. Pt said he is still battling to stop smoking. Pt said he had to use his nebulizer this AM but he said he feels OK now.  T 98.2. Pt already has appt to see Dr [name] on 03/05/10. I checked with Dr. Sharen Hones and he said he would wait on pneumovax for now. Pt can either wait and see Dr [name] on 03/05/10 or can  come and see a dr here at [place] [facility] if needed. Pt said he would wait and see Dr [name] and then ck to see if OK to get Pneumovax unless he got worse and would call back for appt if needed.  Initial call taken by: Lewanda Rife LPN,  February 10, 2010 11:37 AM

## 2010-04-21 NOTE — Miscellaneous (Signed)
Summary: Orders Update pft charges  Clinical Lists Changes  Orders: Added new Service order of Carbon Monoxide diffusing w/capacity (94720) - Signed Added new Service order of Lung Volumes (94240) - Signed Added new Service order of Spirometry (Pre & Post) (94060) - Signed 

## 2010-04-21 NOTE — Assessment & Plan Note (Signed)
Summary: AE-COPD   Visit Type:  Follow-up Copy to:  Dr. Milinda Antis Primary Provider/Referring Provider:  DR Jilda [facility] [place]  CC:  2 month followup.  Pt c/o increased SOB x 4 days.  States that he gets out of breath wit very little exertion such as just getting dressed.   He has been wheezing and has had dry cough- worse in the evening and when lies down.Marland Kitchen  History of Present Illness: 70 yo smoker ( ~100pk-yrs), hx of CAD/PTCI, HTN,  prostate CA. Dx with COPD around 10 yrs ago. Last seen 2007 by PW. Maintanance was Advair bid. Previously on Spiriva, stopped in 2008.  Referred for worsening dyspnea, cough prod of dark phlegm by Dr Milinda Antis. She treated him for AE-COPD, didn't improve fully. Spiriva readded to Advair in 8/11. His breathing has improved some on the combination. Uses DuoNebs as needed, recently qam. Uses Ventolin occas. Able to work, paces himself. Sometimes limited by breathing w heavy exertion. Has had side effects from Chantix before.   ROV 01/02/10 -- follows up for his dyspnea and COPD. He believes that Spiriva has helped his breathing, was added to Advair just before I saw him. Also gave doxy to treat a residual bronchitis. His cough may be a bit better, although still makes mucous every day. Had PFT today, FEV1 47% predicted. Still smoking 1/2 pk day, wants to try to stop by Christmas or New Years Day.   ROV 02/20/10 -- returns for COPD, tobacco. Since last visit has been taken to ED on 11/2, treated for AE-COPD and improved. For last week has had URI symptoms, evolving into more SOB, wheezing, dry cough. He has cut down to 2 -3 cig a day. Stopped Advair but restarted because he missed it.   Current Medications (verified): 1)  Nitroglycerin 0.4 Mg Subl (Nitroglycerin) .... Place 1 Tablet Under Tongue As Directed 2)  Metoprolol Succinate 25 Mg Xr24h-Tab (Metoprolol Succinate) .... Take 1 Tablet By Mouth Once A Day' 3)  Quinapril Hcl 10 Mg  Tabs (Quinapril Hcl) .... Take  1 Tab Every Other Day 4)  Plavix 75 Mg  Tabs (Clopidogrel Bisulfate) .... Take 1 Tablet By Mouth Once A Day 5)  Aspirin 325 Mg Tabs (Aspirin) .... Take 1 By Mouth Once Daily 6)  Ventolin Hfa 108 (90 Base) Mcg/act Aers (Albuterol Sulfate) .... 2 Puffs Every 4 Hrs As Needed Wheezing 7)  Advair Diskus 500-50 Mcg/dose Misc (Fluticasone-Salmeterol) .Marland Kitchen.. 1 Puff Two Times A Day For Wheezing 8)  Duoneb 0.5-2.5 (3) Mg/43ml Soln (Ipratropium-Albuterol) .... Use As Directed One Treatment Every 6 Hours As Needed 9)  Tamsulosin Hcl 0.4 Mg Caps (Tamsulosin Hcl) .... Every Other Day 10)  Alprazolam 0.5 Mg Tabs (Alprazolam) .Marland Kitchen.. 1 By Mouth Once Daily As Needed Severe Anxiety 11)  Spiriva Handihaler 18 Mcg Caps (Tiotropium Bromide Monohydrate) .Marland Kitchen.. 1 Inhalation Once Daily As Directed 12)  Diclofenac Potassium 50 Mg Tabs (Diclofenac Potassium) .Marland Kitchen.. 1 By Mouth Daily As Needed 13)  Ibuprofen 200 Mg Tabs (Ibuprofen) .... Otc As Directed. 14)  Nyquil 60-7.08-18-998 Mg/83ml Liqd (Pseudoeph-Doxylamine-Dm-Apap) .... Per Bottle Directions As Needed  Allergies (verified): 1)  ! Sulfa  Vital Signs:  Patient profile:   70 year old male Weight:      199 pounds O2 Sat:      93 % on Room air Temp:     97.9 degrees F oral Pulse rate:   86 / minute BP sitting:   144 / 80  (left arm)  Vitals Entered By: Vernie Murders (February 20, 2010 4:16 PM)  O2 Flow:  Room air  Physical Exam  General:  normal appearance and healthy appearing.   Head:  normocephalic and atraumatic Eyes:  conjunctiva and sclera clear Nose:  no deformity, discharge, inflammation, or lesions Mouth:  no deformity or lesions Neck:  no masses, thyromegaly, or abnormal cervical nodes Lungs:  distant, B exp wheezes, worse than last visit. Heart:  regular rate and rhythm, S1, S2 without murmurs, rubs, gallops, or clicks Abdomen:  not examined Msk:  no deformity or scoliosis noted with normal posture Extremities:  trace ankle edema Neurologic:   non-focal Skin:  intact without lesions or rashes Psych:  alert and cooperative; normal mood and affect; normal attention span and concentration   Impression & Recommendations:  Problem # 1:  CHRONIC OBSTRUCTIVE PULMONARY DISEASE, ACUTE EXACERBATION (ICD-491.21)  rx pred + doxy Spiriva + Advair rov 2 weeks  Orders: Est. Patient Level IV (57846)  Medications Added to Medication List This Visit: 1)  Nyquil 60-7.08-18-998 Mg/65ml Liqd (Pseudoeph-doxylamine-dm-apap) .... Per bottle directions as needed 2)  Spiriva Handihaler 18 Mcg Caps (Tiotropium bromide monohydrate) .Marland Kitchen.. 1 inhalation once daily 3)  Prednisone 10 Mg Tabs (Prednisone) .... 40mg  daily x 3days, 30mg  daily x 3days, 20mg  daily x 3days, 10mg  daily x3 days then stop. 4)  Doxycycline Hyclate 100 Mg Tabs (Doxycycline hyclate) .Marland Kitchen.. 1 by mouth two times a day  Patient Instructions: 1)  Take Spiriva and Advair 250/50 as you are doing 2)  Use Ventolin as needed  3)  Take prednisone taper as directed.  4)  Take doxycycline for 7 days as directed.  5)  Follow up in 2 weeks as planned.  6)  We will discuss your smoking quit date next time.  Prescriptions: DOXYCYCLINE HYCLATE 100 MG TABS (DOXYCYCLINE HYCLATE) 1 by mouth two times a day  #14 x 0   Entered and Authorized by:   Leslye Peer MD   Signed by:   Leslye Peer MD on 02/20/2010   Method used:   Electron[facility] to        [facility] [phone]* (retail)       [address] [place], [place]  [address]       Ph: [phone] or [phone]       Fax: [phone]   RxID:   [id number] PREDNISONE 10 MG TABS (PREDNISONE) 40mg  daily x 3days, 30mg  daily x 3days, 20mg  daily x 3days, 10mg  daily x3 days then stop.  #30 x 11   Entered and Authorized by:   Leslye Peer MD   Signed by:   Leslye Peer MD on 02/20/2010   Method used:   Electronically to        [facility] [phone]* (retail)       [address] [place], [place]  [address]       Ph: [phone] or [phone]       Fax: [phone]   RxID:   [id number] SPIRIVA HANDIHALER 18 MCG CAPS (TIOTROPIUM BROMIDE MONOHYDRATE) 1 inhalation once daily  #30 x 11   Entered and Authorized by:   Leslye Peer MD   Signed by:   Leslye Peer MD on 02/20/2010   Method used:   [facility]ronically to  [facility] [phone]* (retail)       [address] [place], [place]  [address]       Ph: [phone] or [phone]       Fax: [phone]   RxID:   [id number]

## 2010-04-23 NOTE — Assessment & Plan Note (Signed)
Summary: Cardiology Nuclear Testing  Nuclear Med Background Indications for Stress Test: Evaluation for Ischemia, Stent Patency, PTCA Patency   History: Asthma, COPD, Heart Catheterization, Myocardial Infarction, Myocardial Perfusion Study, Stents  History Comments: '97 MI / stent LAD; '08 MPS; '09 Cath / stent-CX&LAD  Symptoms: Chest Pain, Chest Pain with Exertion, Chest Pressure, DOE, Fatigue, Palpitations, SOB  Symptoms Comments: CP after nicotine patch-tingling in fingers   Nuclear Pre-Procedure Cardiac Risk Factors: Carotid Disease, History of Smoking, Hypertension, Lipids, PVD Caffeine/Decaff Intake: None NPO After: 9:00 PM Lungs: clear IV 0.9% NS with Angio Cath: 18g     IV Site: R Antecubital IV Started by: Stanton Kidney, EMT-P Chest Size (in) 44     Height (in): 70 Weight (lb): 204 BMI: 29.38 Tech Comments: Metoprolol held > 24 hours, per patient. This patient was scheduled to walk on the treadmill. When the patient stood up to walk to the treadmill, his ST segments dropped. The patient started on the treadmill and started having chest pressure and was unable to get his HR up so he was switched to a walking Lexiscan. The patient's chest pressure continued to get worse, his EKG changes worsened, and he had to stop because the chest pressure got worse. Patient given 1 sublingual Nitro. with relief after about 6 minutes. His pictures were checked and showed ischemia. This patient's EF dropped 8 points. J.Katz was consulted about this patient and had S. Weaver to see him since he ordered the test. J.Katz stated this patient needed to be seen admitted.  S.Williams EMTP   Nuclear Med Study 1 or 2 day study:  1 day     Stress Test Type:  Treadmill/Lexiscan Reading MD:  Charlton Haws, MD     Referring MD:  C.McAlhany Resting Radionuclide:  Technetium 71m Tetrofosmin     Resting Radionuclide Dose:  11 mCi  Stress Radionuclide:  Technetium 2m Tetrofosmin     Stress Radionuclide Dose:  33  mCi   Stress Protocol Exercise Time (min):  4:11 min     Max HR:  125 bpm     Predicted Max HR:  151 bpm  Max Systolic BP: 150 mm Hg     Percent Max HR:  82.78 %     METS: 4.6 Rate Pressure Product:  54098  Lexiscan: 0.4 mg   Stress Test Technologist:  Milana Na, EMT-P     Nuclear Technologist:  Domenic Polite, CNMT  Rest Procedure  Myocardial perfusion imaging was performed at rest 45 minutes following the intravenous administration of Technetium 70m Tetrofosmin.  Stress Procedure  The patient received IV Lexiscan 0.4 mg over 15-seconds with concurrent low level exercise and then Technetium 3m Tetrofosmin was injected at 30-seconds while the patient continued walking one more minute.  There were + significant changes and a rare pvc with Lexiscan.  Quantitative spect images were obtained after a 45 minute delay.  QPS Transient Ischemic Dilatation:  1.03  (Normal <1.22)  Lung/Heart Ratio:  .35  (Normal <0.45)  Quantitative Gated Spect Images QGS EDV:  137 ml QGS ESV:  80 ml QGS EF:  42 %  Findings Moderate risk nuclear study  Evidence for inferior infarct     Overall Impression  Exercise Capacity: Fair exercise capacity. BP Response: Normal blood pressure response. 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  SDS 7 abnormal in inferior wall and apex.  Visually no ischemia but  ECG changes worrisome  Appended Document: Cardiology Nuclear Testing pt admitted for cath yesterday and now s/p PCI. cdm

## 2010-04-23 NOTE — Progress Notes (Signed)
Summary: refill request for xanax  Phone Note Refill Request Message from:  Fax from Pharmacy  Refills Requested: Medication #1:  ALPRAZOLAM 0.5 MG TABS 1 by mouth once daily as needed severe anxiety   Last Refilled: 01/26/2010 [facility] [address], [phone]   Initial call taken by: Lowella Petties CMA, AAMA,  March 26, 2010 8:28 AM  Follow-up for Phone Call        px written on EMR for call in  Follow-up by: Judith Part MD,  March 26, 2010 12:48 PM  Additional Follow-up for Phone Call Additional follow up Details #1::        [facility] as instructed. Lewanda Rife LPN  March 26, 2010 12:52 PM     Prescriptions: ALPRAZOLAM 0.5 MG TABS (ALPRAZOLAM) 1 by mouth once daily as needed severe anxiety  #15 x 0   Entered and Authorized by:   Judith Part MD   Signed by:   Lewanda Rife LPN on 04/54/0981   Method used:   Telephoned to ...       [facility] [phone]* (retail)       [address] [place], [place]  [address]       Ph: [phone] or [phone]       Fax: [phone]   RxID:   [id number]

## 2010-04-23 NOTE — Progress Notes (Signed)
Summary: nuc pre-procedure  Phone Note Outgoing Call   Call placed by: Domenic Polite, CNMT,  April 13, 2010 12:16 PM Call placed to: Patient Reason for Call: Confirm/change Appt Summary of Call: Reviewed information on [employer] Information Sheet (see scanned document for further details).  Spoke with ptient's wife.      Nuclear Med Background Indications for Stress Test: Evaluation for Ischemia, Stent Patency, PTCA Patency   History: Asthma, COPD, Heart Catheterization, Myocardial Infarction, Myocardial Perfusion Study, Stents  History Comments: '97 MI / stent LAD; '08 MPS; '09 Cath / stent-CX&LAD  Symptoms: Chest Pain, Chest Pain with Exertion  Symptoms Comments: CP after nicotine patch-tingling in fingers   Nuclear Pre-Procedure Cardiac Risk Factors: Carotid Disease, History of Smoking, Hypertension, Lipids, PVD Height (in): 68

## 2010-04-23 NOTE — Progress Notes (Signed)
Summary: pt has med questions   Phone Note Outgoing Call Call back at [facility] Phone [phone]   Caller: Spouse [phone] or [phone] doris Reason for Call: Talk to Nurse Summary of Call: pt's wife calling re taking the metoprolol and isosobide before stress test Initial call taken by: Glynda Jaeger,  April 10, 2010 8:20 AM Summary of Call: CMA s/w pt's wife and explained that pt needs to hold his metoprolol and isosorbide the day of the stress test. CMA explained that our nuclear dept will be calling today or Monday going over all the instructions. Wife gave verbal understanding. Danielle Rankin, CMA  April 10, 2010 9:17 AM

## 2010-04-23 NOTE — Assessment & Plan Note (Signed)
Summary: Chest Pain  Medications Added ISOSORBIDE MONONITRATE CR 30 MG XR24H-TAB (ISOSORBIDE MONONITRATE) Take  one/half  tablet by mouth daily      Allergies Added:   Visit Type:  Acute Primary Provider:  Colon Flattery Tower MD  CC:  chest pains after nicotine patch usage.  History of Present Illness: Primary Cardiologist:  Dr. Verne Rogers  [name] is a 70 yo male, prior patient of Dr. Juanda Rogers, with a h/o CAD, s/p Ant MI in 1997 tx'd with stent x 2 to the LAD, bare metal stenting to the CFX in 2002 and most recent PCI in 2009 with placement of 2 separate DES to the LAD.  He is followed by Dr. Delton Rogers for COPD and has had several exacerbations lately.  He called the office with chest pain and was brought in for evaluation.  He quit smoking back in December.  He wore nicotine patches for a couple of weeks.  Since he stopped wearing nicotine patches, he has noticed some substernal chest discomfort.  He describes it as an ache. There is no radiation to his jaw.  He feels some tingling in his fingertips.  He has felt nauseated at times.  He will notice the chest symptoms with exertion and it gets better with rest.  He has been chest pain free at times with exertion as well.  He notes symptoms at rest at times.  He has not tried nitroglycerin.  He denies syncope.  He denies associated dyspnea.  He denies associated diaphoresis.  The chest discomfort is mild ("2/10").  It is not getting any worse.  It is somewhat similar to his previous angina.  He denies orthopnea, PND or pedal edema.      Current Medications (verified): 1)  Nitroglycerin 0.4 Mg Subl (Nitroglycerin) .... Place 1 Tablet Under Tongue As Directed 2)  Metoprolol Succinate 25 Mg Xr24h-Tab (Metoprolol Succinate) .... Take 1 Tablet By Mouth Once A Day' 3)  Quinapril Hcl 10 Mg  Tabs (Quinapril Hcl) .... Take 1 Tab Every Other Day 4)  Plavix 75 Mg  Tabs (Clopidogrel Bisulfate) .... Take 1 Tablet By Mouth Once A Day 5)   Aspirin 325 Mg Tabs (Aspirin) .... Take 1 By Mouth Once Daily 6)  Ventolin Hfa 108 (90 Base) Mcg/act Aers (Albuterol Sulfate) .... 2 Puffs Every 4 Hrs As Needed Wheezing 7)  Advair Diskus 500-50 Mcg/dose Misc (Fluticasone-Salmeterol) .Marland Kitchen.. 1 Puff Two Times A Day For Wheezing 8)  Duoneb 0.5-2.5 (3) Mg/59ml Soln (Ipratropium-Albuterol) .... Use As Directed One Treatment Every 6 Hours As Needed 9)  Tamsulosin Hcl 0.4 Mg Caps (Tamsulosin Hcl) .... Every Other Day 10)  Alprazolam 0.5 Mg Tabs (Alprazolam) .Marland Kitchen.. 1 By Mouth Once Daily As Needed Severe Anxiety 11)  Diclofenac Potassium 50 Mg Tabs (Diclofenac Potassium) .Marland Kitchen.. 1 By Mouth Daily As Needed 12)  Ibuprofen 200 Mg Tabs (Ibuprofen) .... Otc As Directed. 13)  Nyquil 60-7.08-18-998 Mg/53ml Liqd (Pseudoeph-Doxylamine-Dm-Apap) .... Per Bottle Directions As Needed 14)  Spiriva Handihaler 18 Mcg Caps (Tiotropium Bromide Monohydrate) .Marland Kitchen.. 1 Inhalation Once Daily  Allergies (verified): 1)  ! Sulfa  Past History:  Past Medical History: COPD Coronary artery disease  a. Ant MI 1997 . . . s/p BMS x 2 to LAD  b. s/p BMS to CFX 2002  c. s/p DES to pLAD and DES to dLAD 2009  d. cath 6/09: pCFX 70%, stent ok; sub-branch of OM occluded; pRCA 70%; EF 50% Hypertension Hyperlipidemia Osteoarthritis Peripheral vascular disease   a. Status post  right carotid enterectomy. Prostate cancer   urol- [name]  Family History: Reviewed history from 02/10/2007 and no changes required. Father: deceased- CVA Mother: pacemaker Siblings: brother deceased from non cancerous brain tumor  Social History: Reviewed history from 03/05/2010 and no changes required. 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  All other systems reviewed and negative.   Vital Signs:  Patient profile:   69 year old male Height:      68 inches Weight:      207.50  pounds BMI:     31.66 Pulse rate:   77 / minute BP sitting:   142 / 72  (left arm) Cuff size:   regular  Vitals Entered By: Ian Riley CNA (April 02, 2010 10:51 AM)  Physical Exam  General:  Well nourished, well developed in no acute distress HEENT: Normal Neck: No JVD Cardiac:  Normal S1, S2; RRR; no murmur Lungs:  Clear to auscultation bilaterally, no wheezing, rhonchi or rales Abd: Soft, nontender, no hepatomegaly, no bruits Ext: No  edema Vascular: No carotid  bruits; R CEA scar noted; Femoral arteries 2+ bilaterally without bruits Skin: Warm and dry MSK:  No deformities Lymph: No adenopathy Endocrine:  No thyromegaly Neuro: CNs 2-12 intact; nonfocal    EKG  Procedure date:  04/02/2010  Findings:      normal sinus rhythm Heart rate 77 Left axis deviation Nonspecific ST-T wave changes No significant change when compared to last tracing  Impression & Recommendations:  Problem # 1:  CAROTID STENOSIS (ICD-433.10) Will arrange f/u dopplers  Problem # 2:  CHEST PAIN (ICD-786.50) His symptoms are somewhat suggestive of his prior angina.  He had 70% CFX and 70% RCA stenosis at cath on 2009.  His EKG is unchanged and he does not appear to be having unstable symptoms.  I had a long talk with him and his wife regarding different strategies to evaluate his symptoms including stress testing vs. heart catheterization.  We decided to proceed with a stress myoview at this time.  I will put him on Imdur 15 mg once daily.  We briefly discussed the Courage Trial.  He will stay on ASA and Plavix.  He will be brought back in f/u with Dr .[name] in 2 weeks for follow up.  I reviewed his case with Dr. Clifton Rogers today.  Orders: EKG w/ Interpretation (93000)  Problem # 3:  CORONARY ATHEROSCLEROSIS NATIVE CORONARY ARTERY (ICD-414.01) Continue ASA and Plavix and plan stress testing as above.  Problem # 4:  HYPERTENSION, BENIGN (ICD-401.1) BP has been low in the past but should  tolerate the Imdur.  Problem # 5:  PURE HYPERCHOLESTEROLEMIA (ICD-272.0) Intolerant to statins due to myalgias.  Other Orders: Nuclear Stress Test (Nuc Stress Test) Carotid Duplex (Carotid Duplex)  Patient Instructions: 1)  Your physician recommends that you schedule a follow-up appointment in: 2-3 weeks with Dr. Clifton Rogers 2)  Your physician has recommended you make the following change in your medication:  3)  Your physician has requested that you have a carotid duplex. This test is an ultrasound of the carotid arteries in your neck. It looks at blood flow through these arteries that supply the brain with blood. Allow one hour for this exam. There are no restrictions or special instructions. 4)  Your physician has requested that you have an exercise stress myoview.  For further  information please visit [email or web address]  Please follow instruction sheet, as given. Prescriptions: ISOSORBIDE MONONITRATE CR 30 MG XR24H-TAB (ISOSORBIDE MONONITRATE) Take  one/half  tablet by mouth daily  #30 x 6   Entered by:   Lisabeth Devoid RN   Authorized by:   [name] PA-C   Signed by:   Lisabeth Devoid RN on 04/02/2010   Method used:   Electronically to        [facility] [phone]* (retail)       [address] [place], [place]  [address]       Ph: [phone] or [phone]       Fax: [phone]   RxID:   [id number]   I have personally reviewed the prescriptions today for accuracy.. . [name] PA-C  April 02, 2010 11:49 AM

## 2010-04-23 NOTE — Assessment & Plan Note (Signed)
Summary: COPD   Visit Type:  Follow-up Copy to:  Dr. Milinda Antis Primary [name]/Referring Nickalous Stingley:  DR Jilda [place]  CC:  COPD...pt says his breathing has improved...cough is better...frothy white sputum...c/o hoarseness today...no cigarettes since 02/23/2010.  History of Present Illness: 70 yo smoker ( ~100pk-yrs), hx of CAD/PTCI, HTN,  prostate CA. Dx with COPD around 10 yrs ago. Last seen 2007 by PW. Maintanance was Advair bid. Previously on Spiriva, stopped in 2008.  Referred for worsening dyspnea, cough prod of dark phlegm by Dr Milinda Antis. She treated him for AE-COPD, didn't improve fully. Spiriva readded to Advair in 8/11. His breathing has improved some on the combination. Uses DuoNebs as needed, recently qam. Uses Ventolin occas. Able to work, paces himself. Sometimes limited by breathing w heavy exertion. Has had side effects from Chantix before.   ROV 01/02/10 -- follows up for his dyspnea and COPD. He believes that Spiriva has helped his breathing, was added to Advair just before I saw him. Also gave doxy to treat a residual bronchitis. His cough may be a bit better, although still makes mucous every day. Had PFT today, FEV1 47% predicted. Still smoking 1/2 pk day, wants to try to stop by Christmas or New Years Day.   ROV 02/20/10 -- returns for COPD, tobacco. Since last visit has been taken to ED on 11/2, treated for AE-COPD and improved. For last week has had URI symptoms, evolving into more SOB, wheezing, dry cough. He has cut down to 2 -3 cig a day. Stopped Advair but restarted because he missed it.   ROV 03/05/10 -- Hx COPD, CAD, active tobacco. Seen 12/2 with an AE, rx pred + doxy. Feels back to baseline. He quit smoking on 12/5, seems to be tolerating it. Still w some cough, frothy white. Very little wheezing. Has decreased SABA use significantly.   Preventive Screening-Counseling & Management  Alcohol-Tobacco     Smoking Status: quit < 6 months     Smoking  Cessation Counseling: yes     Smoke Cessation Stage: quit     Pack years: 100     Tobacco Counseling: to remain off tobacco products  Current Medications (verified): 1)  Nitroglycerin 0.4 Mg Subl (Nitroglycerin) .... Place 1 Tablet Under Tongue As Directed 2)  Metoprolol Succinate 25 Mg Xr24h-Tab (Metoprolol Succinate) .... Take 1 Tablet By Mouth Once A Day' 3)  Quinapril Hcl 10 Mg  Tabs (Quinapril Hcl) .... Take 1 Tab Every Other Day 4)  Plavix 75 Mg  Tabs (Clopidogrel Bisulfate) .... Take 1 Tablet By Mouth Once A Day 5)  Aspirin 325 Mg Tabs (Aspirin) .... Take 1 By Mouth Once Daily 6)  Ventolin Hfa 108 (90 Base) Mcg/act Aers (Albuterol Sulfate) .... 2 Puffs Every 4 Hrs As Needed Wheezing 7)  Advair Diskus 500-50 Mcg/dose Misc (Fluticasone-Salmeterol) .Marland Kitchen.. 1 Puff Two Times A Day For Wheezing 8)  Duoneb 0.5-2.5 (3) Mg/33ml Soln (Ipratropium-Albuterol) .... Use As Directed One Treatment Every 6 Hours As Needed 9)  Tamsulosin Hcl 0.4 Mg Caps (Tamsulosin Hcl) .... Every Other Day 10)  Alprazolam 0.5 Mg Tabs (Alprazolam) .Marland Kitchen.. 1 By Mouth Once Daily As Needed Severe Anxiety 11)  Diclofenac Potassium 50 Mg Tabs (Diclofenac Potassium) .Marland Kitchen.. 1 By Mouth Daily As Needed 12)  Ibuprofen 200 Mg Tabs (Ibuprofen) .... Otc As Directed. 13)  Nyquil 60-7.08-18-998 Mg/49ml Liqd (Pseudoeph-Doxylamine-Dm-Apap) .... Per Bottle Directions As Needed 14)  Spiriva Handihaler 18 Mcg Caps (Tiotropium Bromide Monohydrate) .Marland Kitchen.. 1 Inhalation Once Daily  Allergies (  verified): 1)  ! 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:  quit < 6 months  Vital Signs:  Patient profile:   70 year old male Height:      68 inches (172.72 cm) Weight:      200.25 pounds (91.02 kg) BMI:     30.56 O2 Sat:      91 % on Room air Temp:     98.3 degrees F (36.83 degrees C) oral Pulse rate:   79 / minute BP sitting:   130 / 72   (left arm) Cuff size:   regular  Vitals Entered By: Michel Bickers CMA (March 05, 2010 1:24 PM)  O2 Sat at Rest %:  91 O2 Flow:  Room air CC: COPD...pt says his breathing has improved...cough is better...frothy white sputum...c/o hoarseness today...no cigarettes since 02/23/2010 Comments Medications reviewed with patient Michel Bickers [facility]  March 05, 2010 1:31 PM   Physical Exam  General:  normal appearance and healthy appearing.   Head:  normocephalic and atraumatic Eyes:  conjunctiva and sclera clear Nose:  no deformity, discharge, inflammation, or lesions Mouth:  no deformity or lesions Neck:  no masses, thyromegaly, or abnormal cervical nodes Lungs:  distant, very soft wheeze.  Heart:  regular rate and rhythm, S1, S2 without murmurs, rubs, gallops, or clicks Abdomen:  not examined Msk:  no deformity or scoliosis noted with normal posture Extremities:  trace ankle edema Neurologic:  non-focal Skin:  intact without lesions or rashes Psych:  alert and cooperative; normal mood and affect; normal attention span and concentration   Impression & Recommendations:  Problem # 1:  COPD (ICD-496) recent AE, resolved - same BD's - congratulated and supported cigarette cessation - rov 4 mo or as needed  - pneumovax today  Other Orders: Est. Patient Level IV (29562)  Patient Instructions: 1)  Continue your Spiriva and Advair 250/50 2)  Use Ventolin as needed  3)  Pneumovax today 4)  CONGRATULATIONS on stopping smoking! Do not restart - call our office if you are thinking about restarting.  5)  Follow up with Dr [name] in 4 months or as needed   Appended Document: Orders Update     Clinical Lists Changes  Orders: Added new Service order of Pneumococcal Vaccine (13086) - Signed Added new Service order of Admin 1st Vaccine (57846) - Signed Observations: Added new observation of PNEUMOVAXLOT: 1170AA (03/05/2010 14:29) Added new observation of PNEUMOVAXEXP: 07/30/2011  (03/05/2010 14:29) Added new observation of PNEUMOVAXBY: Zackery Barefoot CMA (03/05/2010 14:29) Added new observation of PNEUMOVAXRTE: IM (03/05/2010 14:29) Added new observation of PNEUMOVAXDOS: 0.5 ml (03/05/2010 14:29) Added new observation of PNEUMOVAXMFR: Merck (03/05/2010 14:29) Added new observation of PNEUMOVAXSIT: left deltoid (03/05/2010 14:29) Added new observation of PNEUMOVAX: Pneumovax (Medicare) (03/05/2010 14:29)       Immunizations Administered:  Pneumonia Vaccine:    Vaccine Type: Pneumovax (Medicare)    Site: left deltoid    Mfr: Merck    Dose: 0.5 ml    Route: IM    Given by: Zackery Barefoot CMA    Exp. Date: 07/30/2011    Lot #: [id number]

## 2010-04-23 NOTE — Letter (Signed)
Summary: ER Notification  Architectural technologist, Main Office  [address]   [place], [place] [address]   Phone: [phone]  Fax: [phone]    April 14, 2010 2:07 PM  [name]  The above referenced patient has been advised to report directly to the Emergency Room. Please see below for more information:  Dx: ____CP________     Private Vehicle  _______________ or EMS:  _______x___-from office- leaving at 1:08pm______   Orders:  Yes __x____ or No  _______   Notify upon arrival:     Trish [phone]       Or _________________   Thank you,    [facility] Staff

## 2010-04-23 NOTE — Assessment & Plan Note (Signed)
Summary: rov    Referring Provider:  Dr. Milinda Antis Primary Provider:  Judith Part MD   History of Present Illness: Primary Cardiologist:  Dr. Verne Carrow  [name] is a 70 yo male, prior patient of [name], with a h/o CAD, s/p Ant MI in 1997 tx'd with stent x 2 to the LAD, bare metal stenting to the CFX in 2002 and most recent PCI in 2009 with placement of 2 separate DES to the LAD.  He is followed by [name] for COPD and has had several exacerbations lately.  He called the office with chest pain and I saw him on 04/02/2010.  I put him on Imdur and had him come in today for a myoview.  He has been having more and more chest pain with exertion and at rest since he was last seen.  He had pain today before his stress test.  His ST segments dropped with exertion in the inferolat. leads.  He has a large area of inferolat. scar on rest images that actually improves with stress.  His EF is 42%.  He has had chest pain with walking down the hall in the office.  He denies syncope.  He has radiating symptoms to his back and arm.  He notes assoc dyspnea.  He does not take NTG due to drops in BP.  He did try the Imdur, but it has not helped.   Allergies: 1)  ! Sulfa  Past History:  Past Medical History: Last updated: 04/02/2010 COPD Coronary artery disease  a. Ant MI 1997 . . . s/p BMS x 2 to LAD  b. s/p BMS to CFX 2002  c. s/p DES to pLAD and DES to dLAD 2009  d. cath 6/09: pCFX 70%, stent ok; sub-branch of OM occluded; pRCA 70%; EF 50% Hypertension Hyperlipidemia Osteoarthritis Peripheral vascular disease   a. Status post right carotid enterectomy. Prostate cancer   urol- [name]  Past Surgical History: Last updated: 10/25/2007 2 stents placed - LAD Pneumonia (1995) Mass on left neck- CT neg, fatty tissue MI-PTCA- stent (1997) Echocardiogram- neg (1995) MI, inferior, PTCA, stent (10/1998) Carotid doppler- 60-79% ICA stenosis  (12/1999) Right carotid  endarterectomy (03/2000) Colonoscopy- polyps, hemorrhoids (04/2001) Cardiolite- old MI EF 51% (07/1999) Carotid doppler- clear right, 0-39 % left (09/2004) Pneumonia- hosp (04/2006) Nuclear stress test- low risk study (10/208) 6/09 hosp CAD-- cath with PTCA  Family History: Last updated: 10-Mar-2007 Father: deceased- CVA Mother: pacemaker Siblings: brother deceased from non cancerous brain tumor  Social History: Last updated: 04/02/2010 Marital Status: Married Children: 5, 3 stepchildren 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  All other systems reviewed and negative.   Physical Exam  General:  Well nourished, well developed in no acute distress HEENT: Normal Neck: No JVD Cardiac:  Normal S1, S2; RRR; no murmur Lungs:  Clear to auscultation bilaterally, no wheezing, rhonchi or rales Abd: Soft, nontender, no hepatomegaly, no bruits Ext: No clubbing, cyanosis or edema Vascular: No carotid  bruits; Femoral arteries 2+ bilaterally without bruits Skin: Warm and dry MSK:  No deformities Lymph: No adenopathy Endocrine:  No thyromegaly Neuro: CNs 2-12 intact; nonfocal    Impression & Recommendations:  Problem # 1:  CHEST PAIN (ICD-786.50) His myoview is significantly abnormal.  It is uncertain as to why he has improvement on his stress images.  His symptoms sound c/w unstable angina.  He  will be admitted to [facility].  He will be transported via EMS.  Of note, he had significant drops in his BP today in nuclear medicine when he took sublingual NTG.  We will try to get his cath arranged for today.  He is still NPO.  Problem # 2:  CORONARY ARTERY DISEASE (ICD-414.00) As above.  Continue ASA and Plavix.  Plan cath today.  Problem # 3:  HYPERTENSION, BENIGN (ICD-401.1) Controlled.  Problem # 4:  HYPERLIPIDEMIA (ICD-272.4) Intolerant to statins.  Problem # 5:  COPD (ICD-496) Continue current meds. His updated  medication list for this problem includes:    Ventolin Hfa 108 (90 Base) Mcg/act Aers (Albuterol sulfate) .Marland Kitchen... 2 puffs every 4 hrs as needed wheezing    Advair Diskus 500-50 Mcg/dose Misc (Fluticasone-salmeterol) .Marland Kitchen... 1 puff two times a day for wheezing    Duoneb 0.5-2.5 (3) Mg/53ml Soln (Ipratropium-albuterol) ..... Use as directed one treatment every 6 hours as needed    Spiriva Handihaler 18 Mcg Caps (Tiotropium bromide monohydrate) .Marland Kitchen... 1 inhalation once daily  Appended Document: rov    Clinical Lists Changes  Observations: Added new observation of MEDRECON: current updated (04/14/2010 13:40) Added new observation of REFERRING MD: Dr. Milinda Antis (04/14/2010 13:40) Added new observation of PRIMARY MD: Colon Flattery Tower MD (04/14/2010 13:40) Added new observation of BP DIASTOLIC: 68 mmHg (04/14/2010 62:13) Added new observation of BP SYSTOLIC: 144 mmHg (04/14/2010 13:40)        Vital Signs:  Patient profile:   70 year old male BP sitting:   144 / 68   Current Medications (verified): 1)  Nitroglycerin 0.4 Mg Subl (Nitroglycerin) .... Place 1 Tablet Under Tongue As Directed 2)  Metoprolol Succinate 25 Mg Xr24h-Tab (Metoprolol Succinate) .... Take 1 Tablet By Mouth Once A Day' 3)  Quinapril Hcl 10 Mg  Tabs (Quinapril Hcl) .... Take 1 Tab Every Other Day 4)  Plavix 75 Mg  Tabs (Clopidogrel Bisulfate) .... Take 1 Tablet By Mouth Once A Day 5)  Aspirin 325 Mg Tabs (Aspirin) .... Take 1 By Mouth Once Daily 6)  Ventolin Hfa 108 (90 Base) Mcg/act Aers (Albuterol Sulfate) .... 2 Puffs Every 4 Hrs As Needed Wheezing 7)  Advair Diskus 500-50 Mcg/dose Misc (Fluticasone-Salmeterol) .Marland Kitchen.. 1 Puff Two Times A Day For Wheezing 8)  Duoneb 0.5-2.5 (3) Mg/30ml Soln (Ipratropium-Albuterol) .... Use As Directed One Treatment Every 6 Hours As Needed 9)  Tamsulosin Hcl 0.4 Mg Caps (Tamsulosin Hcl) .... Every Other Day 10)  Alprazolam 0.5 Mg Tabs (Alprazolam) .Marland Kitchen.. 1 By Mouth Once Daily As Needed Severe  Anxiety 11)  Diclofenac Potassium 50 Mg Tabs (Diclofenac Potassium) .Marland Kitchen.. 1 By Mouth Daily As Needed 12)  Ibuprofen 200 Mg Tabs (Ibuprofen) .... Otc As Directed. 13)  Nyquil 60-7.08-18-998 Mg/85ml Liqd (Pseudoeph-Doxylamine-Dm-Apap) .... Per Bottle Directions As Needed 14)  Spiriva Handihaler 18 Mcg Caps (Tiotropium Bromide Monohydrate) .Marland Kitchen.. 1 Inhalation Once Daily 15)  Isosorbide Mononitrate Cr 30 Mg Xr24h-Tab (Isosorbide Mononitrate) .... Take  One/half  Tablet By Mouth Daily  Allergies: 1)  ! Sulfa

## 2010-04-24 ENCOUNTER — Encounter: Payer: Self-pay | Admitting: Cardiovascular Disease

## 2010-04-24 NOTE — Miscellaneous (Signed)
Summary: Controlled Substances Contract  Controlled Substances Contract   Imported By: Maryln Gottron 12/18/2009 15:29:39  _____________________________________________________________________  External Attachment:    Type:   Image     Comment:   External Document

## 2010-04-27 ENCOUNTER — Encounter: Payer: Self-pay | Admitting: Cardiovascular Disease

## 2010-04-27 ENCOUNTER — Ambulatory Visit (INDEPENDENT_AMBULATORY_CARE_PROVIDER_SITE_OTHER): Payer: MEDICARE | Admitting: Cardiovascular Disease

## 2010-04-27 DIAGNOSIS — I2510 Atherosclerotic heart disease of native coronary artery without angina pectoris: Secondary | ICD-10-CM

## 2010-04-27 DIAGNOSIS — I6529 Occlusion and stenosis of unspecified carotid artery: Secondary | ICD-10-CM

## 2010-04-27 DIAGNOSIS — I10 Essential (primary) hypertension: Secondary | ICD-10-CM

## 2010-04-29 NOTE — Miscellaneous (Signed)
  Clinical Lists Changes  Observations: Added new observation of CARDCATHFIND: IMPRESSION:  Successful percutaneous coronary intervention with placement of a drug-eluting stent in the atrioventricular groove circumflex artery and placement of a drug-eluting stent in the first obtuse marginal coronary artery.   RECOMMENDATIONS:  The patient should be continued on aspirin and Plavix for at least a year.  We will continue his other medications as written.   (04/15/2010 11:08) Added new observation of CARDCATHFIND: CONCLUSION:  Severe single-vessel coronary artery disease.  Residual nonobstructive disease elsewhere.  Patent stents as described.  Normal left ventricular function.   PLAN:  The patient with percutaneous revascularization of the circumflex obtuse marginal and circumflex in the AV groove.   (04/15/2010 11:07) Added new observation of NUCLEAR NOS: Findings  Moderate risk nuclear study  Evidence for inferior infarct     Overall Impression   Exercise Capacity: Fair exercise capacity. BP Response: Normal blood pressure response. 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  SDS 7 abnormal in inferior wall and apex.  Visually no ischemia but ECG changes worrisome  (04/14/2010 11:08)      Cardiac Cath  Procedure date:  04/15/2010  Findings:      CONCLUSION:  Severe single-vessel coronary artery disease.  Residual nonobstructive disease elsewhere.  Patent stents as described.  Normal left ventricular function.   PLAN:  The patient with percutaneous revascularization of the circumflex obtuse marginal and circumflex in the AV groove.    Cardiac Cath  Procedure date:  04/15/2010  Findings:      IMPRESSION:  Successful percutaneous coronary intervention with placement of a drug-eluting stent in the atrioventricular groove circumflex artery  and placement of a drug-eluting stent in the first obtuse marginal coronary artery.   RECOMMENDATIONS:  The patient should be continued on aspirin and Plavix for at least a year.  We will continue his other medications as written.    Nuclear Study  Procedure date:  04/14/2010  Findings:      Findings  Moderate risk nuclear study  Evidence for inferior infarct     Overall Impression   Exercise Capacity: Fair exercise capacity. BP Response: Normal blood pressure response. 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  SDS 7 abnormal in inferior wall and apex.  Visually no ischemia but ECG changes worrisome

## 2010-05-07 NOTE — Assessment & Plan Note (Signed)
Summary: 3wk follow up / myoview/ carotid 04/14/10 / rm  Medications Added ASPIRIN 81 MG TBEC (ASPIRIN) Take one tablet by mouth daily      Allergies Added:   Visit Type:  Follow-up Referring Provider:  Dr. Milinda Antis Primary Provider:  Judith Part MD  CC:  pt has no complaints today.  History of Present Illness: [name] is a 70 yo male, prior patient of [name], with a h/o CAD, s/p Ant MI in 1997 tx'd with stent x 2 to the LAD, bare metal stenting to the CFX in 2002 andt PCI in 2009 with placement of 2 separate DES to the LAD.  He is followed by [name] for COPD and has had several exacerbations lately.  He called the office with chest pain and was seen by [name] on 04/02/10. Cardiac cath on 04/14/10 with severe stenosis in the distal AV groove Circumflex and the OM. These were both treated with DES. He is here today for follow up. He has done well since discharge from the hospital. One episode of dull pain, lasted for a few seconds. Overall feeling well.     Current Medications (verified): 1)  Nitroglycerin 0.4 Mg Subl (Nitroglycerin) .... Place 1 Tablet Under Tongue As Directed 2)  Metoprolol Succinate 25 Mg Xr24h-Tab (Metoprolol Succinate) .... Take 1 Tablet By Mouth Once A Day' 3)  Quinapril Hcl 10 Mg  Tabs (Quinapril Hcl) .... Take 1 Tab Every Other Day 4)  Plavix 75 Mg  Tabs (Clopidogrel Bisulfate) .... Take 1 Tablet By Mouth Once A Day 5)  Aspirin 81 Mg Tbec (Aspirin) .... Take One Tablet By Mouth Daily 6)  Ventolin Hfa 108 (90 Base) Mcg/act Aers (Albuterol Sulfate) .... 2 Puffs Every 4 Hrs As Needed Wheezing 7)  Advair Diskus 500-50 Mcg/dose Misc (Fluticasone-Salmeterol) .Marland Kitchen.. 1 Puff Two Times A Day For Wheezing 8)  Duoneb 0.5-2.5 (3) Mg/33ml Soln (Ipratropium-Albuterol) .... Use As Directed One Treatment Every 6 Hours As Needed 9)  Tamsulosin Hcl 0.4 Mg Caps (Tamsulosin Hcl) .... Every Other Day 10)  Alprazolam 0.5 Mg Tabs (Alprazolam) .Marland Kitchen.. 1 By Mouth Once Daily As  Needed Severe Anxiety 11)  Diclofenac Potassium 50 Mg Tabs (Diclofenac Potassium) .Marland Kitchen.. 1 By Mouth Daily As Needed 12)  Ibuprofen 200 Mg Tabs (Ibuprofen) .... Otc As Directed. 13)  Spiriva Handihaler 18 Mcg Caps (Tiotropium Bromide Monohydrate) .Marland Kitchen.. 1 Inhalation Once Daily  Allergies (verified): 1)  ! Sulfa  Past History:  Past Medical History: Reviewed history from 04/02/2010 and no changes required. COPD Coronary artery disease  a. Ant MI 1997 . . . s/p BMS x 2 to LAD  b. s/p BMS to CFX 2002  c. s/p DES to pLAD and DES to dLAD 2009  d. cath 6/09: pCFX 70%, stent ok; sub-branch of OM occluded; pRCA 70%; EF 50% Hypertension Hyperlipidemia Osteoarthritis Peripheral vascular disease   a. Status post right carotid enterectomy. Prostate cancer   urol- [name]  Social History: Reviewed history from 04/02/2010 and no changes required. 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  The patient denies fatigue, malaise, fever, weight gain/loss, vision loss, decreased hearing, hoarseness, chest pain, palpitations, prolonged cough, wheezing, sleep apnea, coughing up blood, abdominal pain, blood in stool, nausea, vomiting, diarrhea, heartburn, incontinence, blood in urine, muscle weakness, joint pain, leg swelling, rash, skin lesions, headache, fainting,  dizziness, depression, anxiety, enlarged lymph nodes, easy bruising or bleeding, and environmental allergies.    Vital Signs:  Patient profile:   70 year old male Height:      70 inches Weight:      210 pounds BMI:     30.24 Pulse rate:   76 / minute Resp:     18 per minute BP sitting:   122 / 70  (left arm)  Vitals Entered By: Celestia Khat, CMA (April 27, 2010 9:16 AM)  Physical Exam  General:  General: Well developed, well nourished, NAD HEENT: OP clear, mucus membranes moist SKIN:  warm, dry Neuro: No focal deficits Musculoskeletal: Muscle strength 5/5 all ext Psychiatric: Mood and affect normal Neck: No JVD, no carotid bruits, no thyromegaly, no lymphadenopathy. Lungs:Clear bilaterally, no wheezes, rhonci, crackles CV: RRR no murmurs, gallops rubs Abdomen: soft, NT, ND, BS present Extremities: No edema, pulses 2+.    Cardiac Cath  Procedure date:  04/14/2010  Findings:      Hemodynamics:  LV 161/21, AO 152/83.  Coronaries left main was normal.  The LAD had diffuse luminal irregularities.  There was mid stent, which was patent with diffuse luminal in-stent irregularities. There was long mid 30% stenosis.  Distal stent was widely patent.  First diagonal was small and ostial 70% stenosis.  Second diagonal was small and normal.  Circumflex in the AV groove had diffuse luminal irregularities.  Proximal AV groove had a focal 30% stenosis.  The distal AV groove before small posterolaterals and a PDA had a focal 99% stenosis.  There was a mid obtuse marginal, which was very large.  There was a mid stent in the marginal which was widely patent.  There was ostial 80-90% stenosis.  The right coronary artery is dominant.  There was small 99% stenosis.   Left Ventriculogram:  Left ventriculogram was obtained in the RAO projection.  The EF of 60% with inferior hypokinesis.  PCI: DES OM! and distal AV groove Circumflex.   Impression & Recommendations:  Problem # 1:  CORONARY ARTERY DISEASE (ICD-414.00) Stable post PCI. Continue current meds. He will need to be on ASA and Plavix for the remainder of his life.   The following medications were removed from the medication list:    Isosorbide Mononitrate Cr 30 Mg Xr24h-tab (Isosorbide mononitrate) .Marland Kitchen... Take  one/half  tablet by mouth daily His updated medication list for this problem includes:    Nitroglycerin 0.4 Mg Subl (Nitroglycerin) .Marland Kitchen... Place 1 tablet under tongue as directed    Metoprolol Succinate 25 Mg  Xr24h-tab (Metoprolol succinate) .Marland Kitchen... Take 1 tablet by mouth once a day'    Quinapril Hcl 10 Mg Tabs (Quinapril hcl) .Marland Kitchen... Take 1 tab every other day    Plavix 75 Mg Tabs (Clopidogrel bisulfate) .Marland Kitchen... Take 1 tablet by mouth once a day    Aspirin 81 Mg Tbec (Aspirin) .Marland Kitchen... Take one tablet by mouth daily  Problem # 2:  HYPERTENSION, BENIGN (ICD-401.1) BP well controlled. No changes.   His updated medication list for this problem includes:    Metoprolol Succinate 25 Mg Xr24h-tab (Metoprolol succinate) .Marland Kitchen... Take 1 tablet by mouth once a day'    Quinapril Hcl 10 Mg Tabs (Quinapril hcl) .Marland Kitchen... Take 1 tab every other day    Aspirin 81 Mg Tbec (Aspirin) .Marland Kitchen... Take one tablet by mouth daily  Problem # 3:  CAROTID STENOSIS (ICD-433.10) Repeat carotid dopplers in 6 months.   His updated medication list for this problem includes:  Plavix 75 Mg Tabs (Clopidogrel bisulfate) .Marland Kitchen... Take 1 tablet by mouth once a day    Aspirin 81 Mg Tbec (Aspirin) .Marland Kitchen... Take one tablet by mouth daily  Patient Instructions: 1)  Your physician recommends that you schedule a follow-up appointment in: 6 months 2)  Your physician recommends that you continue on your current medications as directed. Please refer to the Current Medication list given to you today. 3)  Your physician has requested that you have a carotid duplex in 6 months. This test is an ultrasound of the carotid arteries in your neck. It looks at blood flow through these arteries that supply the brain with blood. Allow one hour for this exam. There are no restrictions or special instructions.

## 2010-05-13 ENCOUNTER — Ambulatory Visit (INDEPENDENT_AMBULATORY_CARE_PROVIDER_SITE_OTHER): Payer: MEDICARE | Admitting: Emergency Medicine

## 2010-05-13 ENCOUNTER — Encounter: Payer: Self-pay | Admitting: Emergency Medicine

## 2010-05-13 ENCOUNTER — Telehealth: Payer: Self-pay | Admitting: Family Medicine

## 2010-05-13 DIAGNOSIS — J449 Chronic obstructive pulmonary disease, unspecified: Secondary | ICD-10-CM

## 2010-05-19 NOTE — Assessment & Plan Note (Signed)
Summary: COPD exacerbation   Visit Type:  Acute visit Copy to:  Dr. Milinda Antis Primary Provider/Referring Provider:  Judith Part MD  CC:  Acute visit.  Pt c/o incr SOB and prod cough w/ green sputum x1 week.  Also runny nose and sneezing.Marland Kitchen  History of Present Illness: 70 yo smoker ( ~100pk-yrs), hx of CAD/PTCI, HTN,  prostate CA. Dx with COPD around 10 yrs ago. Last seen 2007 by PW. Maintanance was Advair bid. Previously on Spiriva, stopped in 2008.  Referred for worsening dyspnea, cough prod of dark phlegm by Dr Milinda Antis. She treated him for AE-COPD, didn't improve fully. Spiriva readded to Advair in 8/11. His breathing has improved some on the combination. Uses DuoNebs as needed, recently qam. Uses Ventolin occas. Able to work, paces himself. Sometimes limited by breathing w heavy exertion. Has had side effects from Chantix before.   ROV 01/02/10 -- follows up for his dyspnea and COPD. He believes that Spiriva has helped his breathing, was added to Advair just before I saw him. Also gave doxy to treat a residual bronchitis. His cough may be a bit better, although still makes mucous every day. Had PFT today, FEV1 47% predicted. Still smoking 1/2 pk day, wants to try to stop by Christmas or New Years Day.   ROV 02/20/10 -- returns for COPD, tobacco. Since last visit has been taken to ED on 11/2, treated for AE-COPD and improved. For last week has had URI symptoms, evolving into more SOB, wheezing, dry cough. He has cut down to 2 -3 cig a day. Stopped Advair but restarted because he missed it.   ROV 03/05/10 -- Hx COPD, CAD, active tobacco. Seen 12/2 with an AE, rx pred + doxy. Feels back to baseline. He quit smoking on 12/4, seems to be tolerating it. Still w some cough, frothy white. Very little wheezing. Has decreased SABA use significantly.   ROV 05/13/10 -- COPD, CAD, quit smoking Dec 4, 11! Started to have URI symptoms about 1 week ago. Since then he has had increasd wheeze, SOB, no F/C, no CP,  cough prod of greenish phlegm . Tells me that this past January he had CP, was cathed and required PTCI by Dr Clifton James.   Preventive Screening-Counseling & Management  Alcohol-Tobacco     Smoking Status: quit < 6 months     Packs/Day: 2.0     Year Started: 1958     Year Quit: 02/22/2010  Current Medications (verified): 1)  Nitroglycerin 0.4 Mg Subl (Nitroglycerin) .... Place 1 Tablet Under Tongue As Directed 2)  Metoprolol Succinate 25 Mg Xr24h-Tab (Metoprolol Succinate) .... Take 1 Tablet By Mouth Once A Day' 3)  Quinapril Hcl 10 Mg  Tabs (Quinapril Hcl) .... Take 1 Tab Every Other Day 4)  Plavix 75 Mg  Tabs (Clopidogrel Bisulfate) .... Take 1 Tablet By Mouth Once A Day 5)  Aspirin 81 Mg Tbec (Aspirin) .... Take One Tablet By Mouth Daily 6)  Ventolin Hfa 108 (90 Base) Mcg/act Aers (Albuterol Sulfate) .... 2 Puffs Every 4 Hrs As Needed Wheezing 7)  Advair Diskus 500-50 Mcg/dose Misc (Fluticasone-Salmeterol) .Marland Kitchen.. 1 Puff Two Times A Day For Wheezing 8)  Duoneb 0.5-2.5 (3) Mg/20ml Soln (Ipratropium-Albuterol) .... Use As Directed One Treatment Every 6 Hours As Needed 9)  Tamsulosin Hcl 0.4 Mg Caps (Tamsulosin Hcl) .... Every Other Day 10)  Alprazolam 0.5 Mg Tabs (Alprazolam) .Marland Kitchen.. 1 By Mouth Once Daily As Needed Severe Anxiety 11)  Diclofenac Potassium 50 Mg Tabs (Diclofenac Potassium) .Marland KitchenMarland KitchenMarland Kitchen  1 By Mouth Daily As Needed 12)  Ibuprofen 200 Mg Tabs (Ibuprofen) .... Otc As Directed. 13)  Spiriva Handihaler 18 Mcg Caps (Tiotropium Bromide Monohydrate) .Marland Kitchen.. 1 Inhalation Once Daily  Allergies (verified): 1)  ! Sulfa  Social History: Packs/Day:  2.0  Vital Signs:  Patient profile:   70 year old male Height:      70 inches (177.80 cm) Weight:      211.50 pounds (96.14 kg) BMI:     30.46 O2 Sat:      95 % on Room air Temp:     98.1 degrees F (36.72 degrees C) oral Pulse rate:   76 / minute BP sitting:   128 / 72  (left arm) Cuff size:   regular  Vitals Entered By: Michel Bickers CMA (May 13, 2010 2:19 PM)  O2 Sat at Rest %:  95 O2 Flow:  Room air CC: Acute visit.  Pt c/o incr SOB and prod cough w/ green sputum x1 week.  Also runny nose and sneezing. Comments Medications reviewed with patient Michel Bickers [facility]  May 13, 2010 2:20 PM   Physical Exam  General:  normal appearance and healthy appearing.   Head:  normocephalic and atraumatic Eyes:  conjunctiva and sclera clear Nose:  no deformity, discharge, inflammation, or lesions Mouth:  no deformity or lesions Neck:  no masses, thyromegaly, or abnormal cervical nodes Lungs:  distant, a little coarse but no real wheezing Heart:  regular rate and rhythm, S1, S2 without murmurs, rubs, gallops, or clicks Abdomen:  not examined Msk:  no deformity or scoliosis noted with normal posture Extremities:  trace ankle edema Neurologic:  non-focal Skin:  intact without lesions or rashes Psych:  alert and cooperative; normal mood and affect; normal attention span and concentration   Impression & Recommendations:  Problem # 1:  CHRONIC OBSTRUCTIVE PULMONARY DISEASE, ACUTE EXACERBATION (ICD-491.21) Mild AE in setting URI. Will treat with steroids and azithro.   Problem # 2:  CORONARY ARTERY DISEASE (ICD-414.00)  His updated medication list for this problem includes:    Nitroglycerin 0.4 Mg Subl (Nitroglycerin) .Marland Kitchen... Place 1 tablet under tongue as directed    Metoprolol Succinate 25 Mg Xr24h-tab (Metoprolol succinate) .Marland Kitchen... Take 1 tablet by mouth once a day'    Quinapril Hcl 10 Mg Tabs (Quinapril hcl) .Marland Kitchen... Take 1 tab every other day    Plavix 75 Mg Tabs (Clopidogrel bisulfate) .Marland Kitchen... Take 1 tablet by mouth once a day    Aspirin 81 Mg Tbec (Aspirin) .Marland Kitchen... Take one tablet by mouth daily  Medications Added to Medication List This Visit: 1)  Prednisone 10 Mg Tabs (Prednisone) .... 40mg  daily x 3days, 30mg  daily x 3days, 20mg  daily x 3days, 10mg  daily x3 days then stop. 2)  Azithromycin 250 Mg Tabs (Azithromycin) .... Take 2 on  the first day, then 1 by mouth once daily until gone  Patient Instructions: 1)  Continue your inhaled medications as you are taking them 2)  Start prednisone and taper as instructed 3)  Take azithromycin for 5 days as directed 4)  Call our office if you aren't improving 5)  Follow up with Dr [name] in 3 months or as needed  6)  CONGRATULATIONS on quitting smoking! Do NOT restart! Prescriptions: AZITHROMYCIN 250 MG TABS (AZITHROMYCIN) take 2 on the first day, then 1 by mouth once daily until gone  #6 x 0   Entered and Authorized by:   Leslye Peer MD   Signed by:  Leslye Peer MD on 05/13/2010   Method used:   Electronically to        [facility] [phone]* (retail)       [address] [place], [place]  [address]       Ph: [phone] or [phone]       Fax: [phone]   RxID:   [id number] PREDNISONE 10 MG TABS (PREDNISONE) 40mg  daily x 3days, 30mg  daily x 3days, 20mg  daily x 3days, 10mg  daily x3 days then stop.  #30 x 0   Entered and Authorized by:   Leslye Peer MD   Signed by:   Leslye Peer MD on 05/13/2010   Method used:   Electronically to        [facility] [phone]* (retail)       [address] [place], [place]  [address]       Ph: [phone] or [phone]       Fax: [phone]   RxID:   [id number]

## 2010-05-19 NOTE — Progress Notes (Signed)
Summary: refill request for alprazolam  Phone Note Refill Request Message from:  Fax from Pharmacy  Refills Requested: Medication #1:  ALPRAZOLAM 0.5 MG TABS 1 by mouth once daily as needed severe anxiety   Last Refilled: 03/26/2010 [facility] [address], [phone].  Initial call taken by: Lowella Petties CMA, AAMA,  May 13, 2010 1:32 PM  Follow-up for Phone Call        px written on EMR for call in  Follow-up by: Judith Part MD,  May 13, 2010 1:35 PM  Additional Follow-up for Phone Call Additional follow up Details #1::        Rx called to pharmacy Additional Follow-up by: Linde Gillis CMA Duncan Dull),  May 13, 2010 3:03 PM    New/Updated Medications: ALPRAZOLAM 0.5 MG TABS (ALPRAZOLAM) 1 by mouth once daily as needed severe anxiety Prescriptions: ALPRAZOLAM 0.5 MG TABS (ALPRAZOLAM) 1 by mouth once daily as needed severe anxiety  #15 x 0   Entered and Authorized by:   Judith Part MD   Signed by:   Judith Part MD on 05/13/2010   Method used:   Telephoned to ...       [facility] [phone]* (retail)       [address] [place], [place]  [address]       Ph: [phone] or [phone]       Fax: [phone]   RxID:   [id number]

## 2010-05-28 ENCOUNTER — Telehealth: Payer: Self-pay | Admitting: Cardiovascular Disease

## 2010-05-28 NOTE — Miscellaneous (Signed)
Summary: Orders Update   Clinical Lists Changes  Orders: Added new Service order of Est. Patient Level IV (99214) - Signed 

## 2010-06-02 NOTE — Progress Notes (Signed)
Summary: refill request  Medications Added CARVEDILOL 3.125 MG TABS (CARVEDILOL) Take one tablet by mouth twice a day       Phone Note Refill Request Message from:  Patient [phone] on May 28, 2010 10:10 AM  Refills Requested: Medication #1:  METOPROLOL SUCCINATE 25 MG XR24H-TAB Take 1 tablet by mouth once a day' pt's insurance went up-wants to change to metoprolol tartrate, carvedilol, or atenolol   Method Requested: Telephone to Pharmacy Initial call taken by: Glynda Jaeger,  May 28, 2010 10:12 AM Caller: Patient [phone] Reason for Call: Talk to Nurse Summary of Call: pt calling re   Follow-up for Phone Call        Pt's insurance changed to Tier 3 with a copay of $45.00. He was taking Toprol 25mg  daily and would like to switch to Lopressor, Coreg, or Atenolol b/c the copay is $5. Sent to Dr. Clifton James. Whitney Maeola Sarah RN  May 28, 2010 3:26 PM  Follow-up by: Whitney Maeola Sarah RN,  May 28, 2010 3:26 PM  Additional Follow-up for Phone Call Additional follow up Details #1::        We can switch him to Coreg 3.125 mg by mouth  two times a day. thanks, chris Additional Follow-up by: Verne Carrow, MD,  May 28, 2010 4:17 PM    New/Updated Medications: CARVEDILOL 3.125 MG TABS (CARVEDILOL) Take one tablet by mouth twice a day Prescriptions: CARVEDILOL 3.125 MG TABS (CARVEDILOL) Take one tablet by mouth twice a day  #60 x 8   Entered by:   Whitney Maeola Sarah RN   Authorized by:   Verne Carrow, MD   Signed by:   Ellender Hose RN on 05/28/2010   Method used:   Electronically to        [facility] [phone]* (retail)       [address] [place], [place]  [address]       Ph: [phone] or [phone]       Fax: [phone]   RxID:   [id number]

## 2010-06-28 LAB — DIFFERENTIAL
Basophils Absolute: 0 K/uL (ref 0.0–0.1)
Basophils Relative: 0 % (ref 0–1)
Eosinophils Absolute: 0.1 K/uL (ref 0.0–0.7)
Eosinophils Relative: 1 % (ref 0–5)
Lymphocytes Relative: 21 % (ref 12–46)
Lymphs Abs: 1.7 K/uL (ref 0.7–4.0)
Monocytes Absolute: 0.5 K/uL (ref 0.1–1.0)
Monocytes Relative: 6 % (ref 3–12)
Neutro Abs: 6 K/uL (ref 1.7–7.7)
Neutrophils Relative %: 72 % (ref 43–77)

## 2010-06-28 LAB — CBC
HCT: 45.4 % (ref 39.0–52.0)
Hemoglobin: 15.6 g/dL (ref 13.0–17.0)
MCHC: 34.4 g/dL (ref 30.0–36.0)
MCV: 98.5 fL (ref 78.0–100.0)
Platelets: 229 K/uL (ref 150–400)
RBC: 4.61 MIL/uL (ref 4.22–5.81)
RDW: 13.3 % (ref 11.5–15.5)
WBC: 8.3 K/uL (ref 4.0–10.5)

## 2010-06-28 LAB — HEMOCCULT GUIAC POC 1CARD (OFFICE): Fecal Occult Bld: POSITIVE

## 2010-06-28 LAB — POCT I-STAT, CHEM 8
BUN: 12 mg/dL (ref 6–23)
Calcium, Ion: 1.18 mmol/L (ref 1.12–1.32)
Chloride: 103 mEq/L (ref 96–112)
Creatinine, Ser: 1.1 mg/dL (ref 0.4–1.5)
Glucose, Bld: 106 mg/dL — ABNORMAL HIGH (ref 70–99)
HCT: 48 % (ref 39.0–52.0)
Hemoglobin: 16.3 g/dL (ref 13.0–17.0)
Potassium: 4.3 mEq/L (ref 3.5–5.1)
Sodium: 139 meq/L (ref 135–145)
TCO2: 28 mmol/L (ref 0–100)

## 2010-06-28 LAB — PROTIME-INR
INR: 1 (ref 0.00–1.49)
Prothrombin Time: 13.4 s (ref 11.6–15.2)

## 2010-06-28 LAB — APTT

## 2010-06-30 ENCOUNTER — Other Ambulatory Visit: Payer: Self-pay

## 2010-06-30 MED ORDER — ALBUTEROL SULFATE HFA 108 (90 BASE) MCG/ACT IN AERS: 2.0000 | INHALATION_SPRAY | RESPIRATORY_TRACT | Status: DC | PRN

## 2010-07-08 ENCOUNTER — Telehealth: Payer: Self-pay | Admitting: *Deleted

## 2010-07-08 NOTE — Telephone Encounter (Signed)
Ventolin refill from 06/30/10 called to [facility] [address], pharmacy said they didn't have it.

## 2010-07-21 ENCOUNTER — Ambulatory Visit: Payer: MEDICARE | Admitting: Emergency Medicine

## 2010-07-22 ENCOUNTER — Encounter: Payer: Self-pay | Admitting: Emergency Medicine

## 2010-07-24 ENCOUNTER — Encounter: Payer: Self-pay | Admitting: Emergency Medicine

## 2010-07-24 ENCOUNTER — Ambulatory Visit (INDEPENDENT_AMBULATORY_CARE_PROVIDER_SITE_OTHER): Payer: MEDICARE | Admitting: Emergency Medicine

## 2010-07-24 DIAGNOSIS — J449 Chronic obstructive pulmonary disease, unspecified: Secondary | ICD-10-CM

## 2010-07-24 NOTE — Progress Notes (Signed)
  Subjective:    Patient ID: DRACEN REIGLE, male    DOB: 1940-04-20, 70 y.o.   MRN: [id number]  HPI 70 yo smoker (~100pk-yrs), hx of CAD/PTCI, HTN,  prostate CA. Dx with COPD around 10 yrs ago. Last seen 2007 by PW. Maintanance was Advair bid. Previously on Spiriva, stopped in 2008.  Referred for worsening dyspnea, cough prod of dark phlegm by Dr Milinda Antis. She treated him for AE-COPD, didn't improve fully. Spiriva readded to Advair in 8/11. His breathing has improved some on the combination. Uses DuoNebs as needed, recently qam. Uses Ventolin occas. Able to work, paces himself. Sometimes limited by breathing w heavy exertion. Has had side effects from Chantix before.   ROV 01/02/10 -- follows up for his dyspnea and COPD. He believes that Spiriva has helped his breathing, was added to Advair just before I saw him. Also gave doxy to treat a residual bronchitis. His cough may be a bit better, although still makes mucous every day. Had PFT today, FEV1 47% predicted. Still smoking 1/2 pk day, wants to try to stop by Christmas or New Years Day.   ROV 02/20/10 -- returns for COPD, tobacco. Since last visit has been taken to ED on 11/2, treated for AE-COPD and improved. For last week has had URI symptoms, evolving into more SOB, wheezing, dry cough. He has cut down to 2 -3 cig a day. Stopped Advair but restarted because he missed it.   ROV 03/05/10 -- Hx COPD, CAD, active tobacco. Seen 12/2 with an AE, rx pred + doxy. Feels back to baseline. He quit smoking on 12/4, seems to be tolerating it. Still w some cough, frothy white. Very little wheezing. Has decreased SABA use significantly.   ROV 05/13/10 -- COPD, CAD, quit smoking Dec 4, 11! Started to have URI symptoms about 1 week ago. Since then he has had increasd wheeze, SOB, no F/C, no CP, cough prod of greenish phlegm . Tells me that this past January he had CP, was cathed and required PTCI by Dr Clifton James.   ROV 07/24/10 -- COPD, FEV1 1.36 in 10/11. Has been  doing fairly well, some difficulty w breathing in the hot months. Has been using ventolin about 2x a day depending on activity. Remains on Spiriva and Advair. Has not restarted smoking!! Quit x 5 months. Denies any trouble, No exacerbations, No hospitalizations. To see his urologist in June for PSA check.    Review of Systems Per HPI    Objective:   Physical Exam Gen: Pleasant, well-nourished, in no distress,  normal affect  ENT: No lesions,  mouth clear,  oropharynx clear, no postnasal drip  Neck: No JVD, no TMG, no carotid bruits  Lungs: No use of accessory muscles, no dullness to percussion, clear without rales or rhonchi  Cardiovascular: RRR, heart sounds normal, no murmur or gallops, no peripheral edema  Musculoskeletal: No deformities, no cyanosis or clubbing  Neuro: alert, non focal  Skin: Warm, no lesions or rashes       Assessment & Plan:

## 2010-07-24 NOTE — Patient Instructions (Signed)
Please continue your inhaled medications as you are taking them Please follow up with Dr [name] in 6 months or sooner if you have any problems.

## 2010-08-03 ENCOUNTER — Telehealth: Payer: Self-pay | Admitting: Emergency Medicine

## 2010-08-03 MED ORDER — ALBUTEROL SULFATE HFA 108 (90 BASE) MCG/ACT IN AERS: 2.0000 | INHALATION_SPRAY | RESPIRATORY_TRACT | Status: DC | PRN

## 2010-08-03 NOTE — Telephone Encounter (Signed)
Spouse aware [facility] [address] in [place].

## 2010-08-04 NOTE — Assessment & Plan Note (Signed)
[facility]                            CARDIOLOGY OFFICE NOTE   NAME:[name], [name]                      MRN:          [id number]  DATE:09/29/2007                            DOB:          13-Mar-1941    PRIMARY CARE PHYSICIAN:  Marne A. Tower, MD   CLINICAL HISTORY:  Mr. Gramm is a 70 year old and returns for followup  management of his coronary artery disease at his recent intervention.  He had an anterior wall infarction in 1997, treated with stenting, and  subsequently had stenting of the circumflex artery in 2002.  He recently  developed exertional chest pain and he was brought in for  catheterization and found tight lesion at the edge of the stent in the  proximal LAD and another tight lesion in the mid LAD.  He underwent  placement of drug-eluting stent in the proximal and distal vessel.  He  has done quite well since that time.  He had no recurrent chest pain,  shortness of breath, or palpitations.   PAST MEDICAL HISTORY:  Significant for hypertension, hyperlipidemia, and  previous carotid endarterectomy.   REVIEW OF SYSTEMS:  Positive for some hemorrhoidal bleeding recently  while he was on Plavix and aspirin, but it did not interrupt these.   SOCIAL HISTORY:  He is retired from the [employer], but now works  part-time in the [employer].   CURRENT MEDICATIONS:  1. Advair.  2. Simvastatin.  3. Quinapril.  4. Metoprolol.  5. Plavix.  6. Aspirin.   PHYSICAL EXAMINATION:  VITAL SIGNS:  Blood pressure 145/71, pulse 78 and  regular.  NECK  There was no venous distension.  The carotid pulses were full  without bruits.  There was carotid endarterectomy scar.  CHEST:  Clear.  CARDIAC:  Rhythm was regular.  There was no murmurs, rubs, or gallops.  ABDOMEN:  Soft and normal bowel sounds.  There was no  hepatosplenomegaly.  EXTREMITIES:  Peripheral pulses were full and there is no peripheral  edema.   LABORATORY DATA:   Electrocardiogram today showed left axis deviation,  nonspecific ST-T changes.   IMPRESSION:  1. Coronary artery disease, status post remote stenting of the LAD for      an anterior wall infarction in 1997, and status post stenting of      the circumflex artery in 2002, with recent placement of two non-      overlapping drug-eluting stents in the proximal and mid LAD.  2. Good left ventricular function.  3. Hyperlipidemia.  4. Hypertension.  5. Status post carotid endarterectomy.  6. Cigarette use.  7. Chronic bronchitis.   RECOMMENDATIONS:  I think Mr. Torregrossa is doing well at present.  A recent  lipid profile by Dr. Royden Purl office and we will try and retrieve that.  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  If it is running more over 130 more than usually, he will  let us know and we will consider going up on the quinapril.  I will plan  to see him back in 6  months.     Bruce Elvera Lennox [name], MD, [facility]  Electronically Signed    BRB/MedQ  DD: 09/29/2007  DT: 09/30/2007  Job #: [id number]

## 2010-08-04 NOTE — Assessment & Plan Note (Signed)
[facility]                            CARDIOLOGY OFFICE NOTE   NAME:[name], [name]                      MRN:          [id number]  DATE:07/07/2007                            DOB:          11-Apr-1940    PRIMARY CARE PHYSICIAN:  Dr. [name].   CLINICAL HISTORY:  [name] is 70 years old and returns for management  of his coronary heart disease.  He had anterior infarction in 1997  treated with PTCA, and he subsequently had PCI of his circumflex artery  in 2002.  He has done quite well since that time.  He has had no recent  chest pain or palpitations.  He has chronic shortness of breath which he  relates with obstructive lung disease.   PAST MEDICAL HISTORY:  Significant for:  1. Hypertension.  2. Hyperlipidemia.  3. Carotid endarterectomy.   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   His current medications include:  1. Aspirin.  2. Advair.  3. Simvastatin.  4. Metoprolol.  5. Quinapril.   On examination, the blood pressure was 132/68 and pulse 74 and regular.  There was no venous distension.  The carotid pulses were full without  bruits.  CHEST:  Was clear.  HEART:  Rhythm was regular.  No murmurs or gallops.  ABDOMEN:  Soft without organomegaly.  Peripheral pulses were full.  There was no peripheral edema.   Electrocardiogram showed sinus rhythm and was normal.   IMPRESSION:  1. Coronary artery status post anterior wall myocardial infarction      treated with percutaneous transluminal coronary angioplasty in 1997      with subsequent percutaneous coronary intervention of circumflex      artery in 2000.  2. Good left ventricular function.  3. Hyperlipidemia.  4. Hypertension.  5. Status post carotid endarterectomy.  6. Cigarette use.  7. Chronic bronchitis   I think [name] is doing quite well.  Will plan to get a CBC, BMP,  lipid and liver,  and TSH on him.  We will encourage him again about  cigarettes and will see him back in follow-up in a year.     Bruce Elvera Lennox [name], MD, [facility]  Electronically Signed    BRB/MedQ  DD: 07/07/2007  DT: 07/07/2007  Job #: [id number]

## 2010-08-04 NOTE — Discharge Summary (Signed)
NAME:  GURPREET, MIKHAIL NO.:  [id number]   MEDICAL RECORD NO.:  [id number]          PATIENT TYPE:  INP   LOCATION:  6529                         FACILITY:  [facility]   PHYSICIAN:  Everardo Beals. [name], MD, FACCDATE OF BIRTH:  [birth year]   DATE OF ADMISSION:  09/07/2007  DATE OF DISCHARGE:  09/08/2007                               DISCHARGE SUMMARY   PROCEDURES:  1. Cardiac catheterization.  2. Coronary arteriogram.  3. Left ventriculogram.  4. Percutaneous transluminal coronary angioplasty and drug-eluting      stent x3 to the left anterior descending.   PRIMARY/FINAL DISCHARGE DIAGNOSIS:  Unstable anginal pain.   SECONDARY DIAGNOSES:  1. Status post anterior wall myocardial infarction in 1997 with stent      to the left anterior descending.  2. Inferolateral myocardial infarction in 2000 with stent to the      obtuse marginal.  3. Hypertension.  4. Hyperlipidemia.  5. History of carotid endarterectomy.  6. Chronic obstructive pulmonary disease.  7. Ongoing tobacco use  8. Allergy or intolerance to SULFA.   TIME OF DISCHARGE:  33 minutes.   HOSPITAL COURSE:  Mr. Nicol is a 70 year old male with known coronary  artery disease.  He saw [name] on September 05, 2007, with recurrent  chest pain.  His symptoms appeared to be progressive and [name] felt  cardiac catheterization was indicated.  He was admitted for this on September 07, 2007.   The cardiac catheterization showed 70% circumflex and RCA but a 95% LAD  that was treated with PTCA and three drug-eluting stents reducing the  mid LAD stenosis from 95% to 0 and the distal LAD stenosis from 80% to  0.  His EF was approximately 50%.   On September 08, 2007, Mr. Korff vital signs were stable.  His postprocedure  labs were within normal limits.  [name] felt that he could be safely  discharged home with outpatient follow-up in the office.   DISCHARGE INSTRUCTIONS:  1. His activity level is to be increased  gradually.  2. He is not to drive for 2 days, no lifting for 2 weeks.  3. He is to call our office for any problems with the cath site.  4. He is to stick to a low-sodium heart-healthy diet.  5. He is to follow up with [name] on September 29, 2007, at 2:00 p.m.      and with Dr. Milinda Antis as needed.   DISCHARGE MEDICATIONS:  1. Aspirin 325 mg daily.  2. Plavix 75 mg daily  3. Quinapril 10 mg daily.  4. Metoprolol 12.5 mg b.i.d.  5. Zocor 20 mg daily.  6. DuoNeb p.r.n.  7. Albuterol MDI p.r.n.  8. Voltaren 50 mg p.r.n., minimize use.  9. Sublingual nitroglycerin p.r.n.Bjorn Loser Barrett, PA-C      Everardo Beals [name], MD, [facility]  Electronically Signed    RB/MEDQ  D:  09/08/2007  T:  09/08/2007  Job:  [id number]   cc:   [name], MD

## 2010-08-04 NOTE — Assessment & Plan Note (Signed)
[facility]                            CARDIOLOGY OFFICE NOTE   NAME:Ding, OBERT ESPINDOLA                      MRN:          [id number]  DATE:09/05/2007                            DOB:          Jun 14, 1940    PRIMARY CARE PHYSICIAN:  Marne A. Tower, MD   CLINICAL HISTORY:  Mr. Faria is 70 years old, and came in today for an  unscheduled visit because of recent chest pain.  In 1997, he had an  anterior wall myocardial infarction treated with 2 overlapping Palmaz-  Schatz stents in the LAD.  In 2000, he had an inferolateral infarction  and underwent emergency stenting of the circumflex marginal vessel by  Dr. Gerri Spore using a 3.5 x 16 mm NIR stent.  He has done well since  that time and I last saw him in April 2008.  However, since then, he has  developed recurrent episodes of chest pain which he describes as a  substernal sensation with some radiation down to his left arm, not  associated with diaphoresis.  These sometimes occur with exercise, but  not predictably.  It has been, on the average every other day and it  lasts a minute or 2 at a time.  His wife called and he and his wife are  concerned and arranged to come in for evaluation.   PAST MEDICAL HISTORY:  Significant for hypertension, hyperlipidemia, and  previous carotid endarterectomy.  He also has COPD with continued  cigarette use.   CURRENT MEDICATIONS:  1. Aspirin 81 mg daily.  2. Advair 500/50 inhaler b.i.d.  3. Simvastatin 20 mg nightly  4. Metoprolol 25 mg 1/2 tablet b.i.d.   PHYSICAL EXAMINATION:  Blood pressure is 147/77 with pulse 68 and  regular.  There was no venous distention.  The carotid pulses were full  and there was a carotid endarterectomy scar.  CHEST:  Decreased breath sounds and a few scattered wheezes.  CARDIAC:  Rhythm was regular.  I could hear no murmurs or gallops.  ABDOMEN:  Soft with normal bowel sounds.  There was no  hepatosplenomegaly.  The peripheral pulses  are full and no peripheral edema.   Electrocardiogram showed left axis deviation and minor nonspecific ST-T  change in __________.   IMPRESSION:  1. Chest pain, possibly somewhat atypical or possibly indicative of      ischemia.  2. Coronary artery disease, status post anterior wall myocardial      infarction treated with overlapping Palmaz-Schatz stents in 1997      and status post an inferolateral myocardial infarction treated with      NIR stent to the circumflex artery in 2000.  3. Overall good left ventricular function.  4. Hypertension.  5. Hyperlipidemia.  6. Status post carotid endarterectomy.  7. Chronic obstructive pulmonary disease with continued cigarette use.   RECOMMENDATIONS:  Mr. Kneale' symptoms are little bit atypical, but are  concerning for ischemia.  With his previous scars, I think  catheterization is a better way to evaluate his problem.  We have  arranged for him to come to the hospital, Thursday, for  an outpatient  catheterization.  We will be in touch with him.  We will make decisions  after we have the results of his tests.  We will also encourage him to  work on decreasing and discontinuing cigarette smoking.     Bruce Elvera Lennox [name], MD, [facility]  Electronically Signed    BRB/MedQ  DD: 09/05/2007  DT: 09/06/2007  Job #: [id number]

## 2010-08-04 NOTE — Assessment & Plan Note (Signed)
[facility]                            CARDIOLOGY OFFICE NOTE   NAME:Garfinkel, SEHAJ MCENROE                      MRN:          [id number]  DATE:04/05/2008                            DOB:          1941-01-01    PRIMARY CARE PHYSICIAN:  Marne A. Tower, MD   CLINICAL HISTORY:  Mr. Pasion is 70 years old and returned for management  of his coronary heart disease.  He had an anterior wall myocardial  infarction 1997 treated with 2 Palmaz-Schatz stents.  He subsequently  had stenting of the circumflex artery in 2002 with a bare-metal stent.  In 2009, he developed recurrent chest pain and was found to have tight  lesions at the edge of the proximal stent in the LAD and another tight  lesion in the mid LAD and underwent placement of 2 non-overlapping drug-  eluting stent in the proximal and distal LAD.  He has done quite well  since that time and has had no recent chest pain, shortness of breath,  or palpitations.   His past history is significant for hypertension, hyperlipidemia, and a  previous carotid enterectomy.   SOCIAL HISTORY:  He is retired from the [employer], but now works  part time there.   Current medications include:  1. Metoprolol ER 25 mg daily.  2. Quinapril 10 mg daily.  3. Simvastatin 20 mg daily which he had on hold for a month because of      muscle aches.  4. Plavix.  5. Aspirin.  6. Advair.   On examination, the blood pressure was 135/71 and the pulse 92 and  regular.  There was no venous tension.  The carotid pulses were full  without bruits.  Chest was clear.  Cardiac rhythm was regular.  I could  hear no murmurs or gallops.  The abdomen was soft with normal bowel  sounds.  Peripheral pulses were full with no peripheral edema.   Electrocardiogram showed slight nonspecific ST-T changes.   IMPRESSION:  1. Coronary artery disease status post prior stenting of the left      anterior descending for an anterior wall infarction  in 1997 with 2      Palmaz-Schatz stent, status post prior stenting of the circumflex      artery with a bare-metal stent in 2002, and recent placement of 2      non-overlapping drug-eluting stents in the proximal and mid left      anterior descending.  2. Good left ventricular function.  3. Hyperlipidemia.  4. Hypertension.  5. Status post carotid enterectomy.  6. Cigarette use.  7. Chronic bronchitis.   RECOMMENDATIONS:  I think Mr. Kawabata is doing fairly well.  He  unfortunately has been off his statins for a month.  He feels like the  simvastatin causes muscle aches, so we will switch him to pravastatin 40  mg a day and get a lipid and liver and CBC and BMP in a month.  Unfortunately, he is still smoking about two-thirds of a pack a day and  I encouraged him to work on decreasing and discontinuing  that.  I told  him the target blood pressure was 130.  We will plan to see him back in  followup in 6 months.     Bruce Elvera Lennox [name], MD, [facility]  Electronically Signed    BRB/MedQ  DD: 04/05/2008  DT: 04/06/2008  Job #: [id number]

## 2010-08-04 NOTE — Cardiovascular Report (Signed)
NAME:  JOEANGEL, JEANPAUL NO.:  [id number]   MEDICAL RECORD NO.:  [id number]          PATIENT TYPE:  INP   LOCATION:  6529                         FACILITY:  [facility]   PHYSICIAN:  Everardo Beals. [name], MD, FACCDATE OF BIRTH:  [birth year]   DATE OF PROCEDURE:  09/07/2007  DATE OF DISCHARGE:                            CARDIAC CATHETERIZATION   HISTORY:  Mr. Rahimi is 70 years old who has had a previous anterior and  posterior wall infarction treated with bare-metal stent.  He recently  had exertional chest pain and was brought into the outpatient laboratory  where we found a 95% lesion in the mid LAD as well as an 80% lesion in  the distal LAD.  We brought him upstairs for intervention.   PROCEDURE:  The procedure was performed at the right femoral arteries,  arterial sheath, and a 6-French Q4 guiding catheter with side holes.  The patient was given antiemetics, bolus infusion, and was loaded with  600 mg of Plavix and 4 baby aspirin.  We passed a P[place]ater wire down the  LAD across the lesions without difficulty.  We predilated the mid lesion  with a 2.25 x 12-mm apex performing 1 inflation up to 8 atmospheres for  30 seconds.  We then deployed a 2.5 x 12-mm Palmaz stent in the mid LAD  just barely overlapping the previous Palmaz-Schatz stent in the proximal  LAD.  We deployed this with 1 inflation of 17 atmospheres for 30 seconds  and postdilated with a 3.0 x 8-mm [employer] Voyager performing 2 inflations up  to 16 atmospheres for 30 seconds.   We then direct stented the lesion in the distal LAD using a 2.25 x 12-mm  Taxus stent and deploying this with 1 inflation at 15 atmospheres for 30  seconds.  At this point, we felt that the transition at the distal edge  of the stent in the mid vessel had a possible dissection, and there was  more plaque just distal to the stent edge, then optimal.  For this  reason, we placed another stent, which was a 2.25 x 8-mm Taxus-Atom  stent just  overlapping the previous stent and just distal to the  previous stent.  We deployed this with 1 inflation at 14 atmospheres for  30 seconds.  We then postdilated with a 2.75 x 8-mm Quantum Maverick  performing 2 inflations up to 18 atmospheres for 30 seconds.  Final  diagnostics were then performed through the guiding catheter.  The  patient tolerated procedure well and left laboratory in satisfactory  condition.   RESULTS:  Initially, stenosis in the midvessel was 95% and was located  just distal to the previously placed Palmaz-Schatz stent.  Following  placement of tandem overlapping Palmaz and Taxus stents, stenosis  improved from 95% to 0%.   The stenosis in the distal LAD was initially 80%, and following  treatment with a Taxus-Atom stent, this improved to 0%.   CONCLUSION:  1. Successful PCI of the lesion in the mid left anterior descending      artery with overlapping Palmaz and Taxus stents with  improvement in      central narrowing from 95% to 0%.  2. Successful PCI of the lesion in the distal LAD using a Taxus-Atom      drug-eluting stent with improvement in central narrowing from 80%      to 0%.   DISPOSITION:  The patient comes in today for further observation.  Full  management, we recommend Plavix for at least 1 year.      Bruce Elvera Lennox [name], MD, [facility]  Electronically Signed     BRB/MEDQ  D:  09/07/2007  T:  09/08/2007  Job:  [id number]   cc:   [name], MD

## 2010-08-04 NOTE — Assessment & Plan Note (Signed)
HEALTHCARE                            CARDIOLOGY OFFICE NOTE   NAME:Parchment, JABAR KRYSIAK                      MRN:          [id number]  DATE:12/23/2006                            DOB:          October 20, 1940    PRIMARY CARE PHYSICIAN:  Dr. [name].   CLINICAL HISTORY:  Mr. Phillis is 70 years old and returning for  manangement of his coronary heart disease. He had an anterior wall in  1997 treated with PTCA and subsequently had PCI of the circumflex artery  in 2002. He has done quite well since that time although over the last  couple of weeks, he has had two episodes of chest pain. These occurred  when he was mowing his lawn and did a little bit more than what he was  used to. He described it as a substernal pressure that lingered after he  stopped what he was doing.   PAST MEDICAL HISTORY:  Significant for a carotid endarterectomy,  hypertension, and hyperlipidemia. We started him on Simvastatin last  year, but he says that he has been out of it for a week or two. He also  has hypertension and he says that when he checks his blood pressure in  the pharmacy are in the range of 135 to 140 systolic.   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   CURRENT MEDICATIONS:  1. Toprol.  2. Quinapril.  3. Aspirin.  4. Advair.  5. Simvastatin.   PHYSICAL EXAMINATION:  VITAL SIGNS:  Blood pressure 152/79, pulse 70 and  regular.  NECK:  There was no vein distension. Carotid pulses were full without  bruits.  CHEST:  Clear without rales or rhonchi.  CARDIAC:  Rhythm was regular. I could hear no murmurs or gallops.  ABDOMEN:  Soft without organomegaly.  EXTREMITIES:  Peripheral pulses were full and there was no peripheral  edema.   Electrocardiogram showed left axis deviation and poor R-wave  progression.   IMPRESSION:  1. Coronary artery disease status anterior wall  myocardial infarction      treated with PTCA in 1997 and subsequent PCI to circumflex artery      in 2000.  2. Recent episode of chest pain suggestive of angina.  3. Good LV function.  4. Hyperlipidemia.  5. Status post carotid endarterectomy.  6. Hypertension under optimal control.  7. Continued cigarette use.  8. Chronic bronchitis.   RECOMMENDATIONS:  I am concerned about Mr. Derwin' recent episode of  chest pain. We will plan to evaluate a Myoview scan. His blood pressure  is also not optimal and we will increase his Quinapril from 5 mg to 10  mg daily. He had trouble with the Quinapril and Toprol combination and  we had to cut back on the Toprol, but he has been able to tolerate this  by taking the Quinapril in the morning and the Toprol at night. He did  take Simvastatin last year, although he has been off of it for a couple  of weeks, so  we will give him a new prescription for this and we will  recheck a lipid profile a month after he starts it. We will also get a  CBC, BNP, and liver at that time. We will also give him a prescription  for nitroglycerin. I will see him back in a year if all of his tests are  good.     Bruce Elvera Lennox [name], MD, [facility]  Electronically Signed    BRB/MedQ  DD: 12/23/2006  DT: 12/24/2006  Job #: [id number]

## 2010-08-04 NOTE — Cardiovascular Report (Signed)
NAME:  [name], [name] NO.:  [id number]   MEDICAL RECORD NO.:  [id number]          PATIENT TYPE:  INP   LOCATION:  6529                         FACILITY:  [facility]   PHYSICIAN:  Everardo Beals. [name], MD, [facility]   DATE OF BIRTH:   DATE OF PROCEDURE:  09/07/2007  DATE OF DISCHARGE:                            CARDIAC CATHETERIZATION   CLINICAL HISTORY:  Mr. Moan is 70 years old who has had previous  treatment with 2 overlapping Palma stents for an anterior wall  infarction in 1997, and in 2000 had a NIR stent in the circumflex  marginal vessel for a posterior infarction.  Recently, he has developed  increasing chest pain and we brought him in today for evaluation with  angiography in the [facility] lab.   PROCEDURE:  The procedure was performed with the right femoral margin  arterial sheath and 4-French pyriform coronary catheters.  A femoral  arterial puncture was performed and Omnipaque contrast was used. The  patient tolerated the procedure well and left the laboratory in  satisfactory condition.   RESULTS:  The aortic pressure was 141/71 with a mean of 99 and left  ventricular pressure was 141/24.   The left main coronary artery:  The left main coronary artery is free of  disease.   The left anterior descending artery:  The left anterior descending  artery gave rise to 3 diagonal branches and septal perforator.  The  stent in the proximal to mid LAD was patent with less than 10% stenosis.  There was a 95% stenosis in the mid LAD just distal to the stent.   The circumflex artery:  The circumflex artery gave rise to a small  marginal branch, a large marginal branch, two posterolateral branches,  and a posterior descending branch.  There was 70% narrowing in the  proximal circumflex artery.  There was 0% stenosis at the stent site in  the large marginal branch.   The right coronary: The right coronary is a small vessel, gave rise to a  right ventricular branch and a very  very small posterior descending  branch.  There was diffuse disease in the proximal vessel with multiple  70% stenoses.   The left ventriculogram performed in the RAO projection showed mild  hypokinesis of the anterolateral wall and mild hypokinesis of the mid  inferior wall.  The estimated ejection fraction was 50%.   CONCLUSION:  Coronary artery status post prior percutaneous coronary  interventions as described above with less than 10% stenosis at the  stent site in the proximal and mid LAD with 95% stenosis in the mid LAD  distal to the stent, 70% narrowing in the proximal circumflex artery  with 0% stenosis at the stent site in the large marginal branch and  occlusion of a small subbranch of this marginal branch, and 70% proximal  stenosis in a small right coronary with mild inferior wall and mild  anterolateral wall hypokinesis and an estimated ejection fraction of  50%.   RECOMMENDATIONS:  The culprit lesion is clearly the lesion in the mid  LAD.  We will plan intervention  later today.      Bruce Elvera Lennox [name], MD, [facility]  Electronically Signed     BRB/MEDQ  D:  09/07/2007  T:  09/08/2007  Job:  [id number]   cc:   Marne A. Tower, MD  Cardiopulmonary Lab

## 2010-08-07 NOTE — Op Note (Signed)
[place]. [facility]  Patient:    [name], SEE                        MRN: [id number] Proc. Date: 03/28/00 Adm. Date:  [id number] Attending:  Alyson Locket CC:         [name], M.D. 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  OPERATION PERFORMED:  Right carotid endarterectomy and Dacron patch angioplasty.  SURGEON:  Larina Earthly, M.D.  ASSISTANT:  Areta Haber, P.A.  ANESTHESIA:  General endotracheal.  COMPLICATIONS:  None.  DISPOSITION:  To recovery room stable.  DESCRIPTION OF PROCEDURE:  The patient was taken to the operating room and placed in position where the area of the right neck was prepped and draped in the usual sterile fashion.  Incision made anterior sternocleidomastoid and carried down through the platysma with electrocautery.  Sternocleidomastoid was retracted posteriorly and the carotid sheath was opened.  The facial vein was ligated with 2-0 silk ties and divided.  The vagus nerve was identified and preserved.  The common carotid artery was encircled with an umbilical tape and Rummel tourniquet.  The superior thyroid artery was encircled with 2-0 silk Potts tie, the external carotid artery was encircled with a blue vessel loop and the internal carotid artery was encircled with umbilical tape and Rummel tourniquet.  The hypoglossal nerve was identified and perserved.  The patient was given 7000 units of intravenous heparin.  After adequate circulation time the internal and external carotid arteries were occluded. The common carotid artery was opened with an 11 blade and extended longitudinally with Potts scissors.  The 10 shunt was passed up the internal carotid artery, allowed to backbleed, and then down the common carotid artery where  it was secured with Rummel tourniquet.  The endarterectomy was begun on the common carotid artery and the plaque was divided proximally with Potts scissors.  The endarterectomy was carried down onto the bifurcation.  The external carotid was endarterectomized with eversion technique and the internal carotid artery was endarterectomized in open fashion.  The remaining atheromatous debris was removed from the endarterectomy plane.  The Finesse Dacron hemashield patch was brought onto the field.  It was cut to the right dimension and sewn as a patch angioplasty.  Prior to completion of the patch closure, the shunt was removed and the usual flushing maneuvers undertaken. The anastomosis was completed. The external followed by the common and finally the internal carotid artery occlusion clamps were removed.  Excellent flow characteristics were noted with hand held Doppler in the internal and external carotid arteries.  The patient was given  50 mg of protamine to reverse heparin.  The wound was irrigated and hemostasis achieved with electrocautery. The wounds were closed with interrupted 3-0 Vicryl suture reapproximating the sternocleidomastoid over the carotid sheath.  Next the platysma was closed with running 3-0 Vicryl suture and finally the skin was closed with a 4-0 subcuticular Vicryl stitch.  The patient was awakened in the operating room,l was neurologically intact and transferred to the recovery room in stable condition. DD:  03/28/00 TD:  03/28/00 Job: [id number]

## 2010-08-07 NOTE — H&P (Signed)
. [facility]  Patient:    [name], [name]                        MRN: [id number] Adm. Date:  03/28/00 Attending:  Larina Earthly, M.D. Dictator:   Marlowe Kays, P.A.                         History and Physical  DATE OF BIRTH:  Feb 15, 1941  CHIEF COMPLAINT:  Right carotid artery stenosis.  HISTORY OF PRESENT ILLNESS:  [name] is a pleasant 70 year old white male referred by Dr. Milinda Antis regarding carotid artery disease.  The patient had an episode of right visual loss for about one minute.  He saw his ophthalmologist on November 2001.  Detected a plaque in the right eye.  Apparently, the patient had been told that he had this plaque a few years ago.  Follow-up Dopplers reveal moderate to severe carotid artery stenosis with a 60-79% range.  [name] recommended right CEA to reduce the risk of visual loss and stroke.  He denies any weakness.  He has occasional numbness and tingling, the last episode two weeks ago.  This occurs in the left arm.  No facial droop, difficulty standing or walking.  No falling or impaired speech.  No headache, nausea, vomiting, or vertigo.  No dizziness or blurred vision.  However, he has amaurosis fugax as mentioned above.  No recurrent episodes of it.  No seizures or dysphasia.  No confusion or memory loss.  PAST MEDICAL HISTORY: 1. Carotid artery disease. 2. CAD. 3. Bronchitis-asthma (COPD). 4. Continuous tobacco abuse. 5. History of pneumonia 1995. 6. Hemorrhoids.  PAST SURGICAL HISTORY:  Status post cardiac catheterization with PTCA and stent x 2 in 1997 and 2000.  MEDICATIONS: 1. Plavix 75 mg p.o. q.d. stopped in December 2001. 2. Toprol 100 mg q.d. 3. Accupril 5 mg p.o. q.d. 4. Flovent inhaler p.r.n. 5. Aspirin 325 mg p.o. q.d.  ALLERGIES:  SULFA.  REVIEW OF SYSTEMS:  See HPI and past medical history for significant positives.  FAMILY HISTORY:  Mother alive with a history of pacemaker placement  secondary to sick sinus syndrome.  Otherwise, noncontributory.  SOCIAL HISTORY:  Married.  Five children, one deceased.  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  He smokes one pack of tobacco a day for the last 42 years.  He drinks alcohol only on social occasions.  PHYSICAL EXAMINATION:  GENERAL:  Well-developed, well-nourished 70 year old white male in no acute distress.  Alert and oriented x 3.  VITAL SIGNS:  Blood pressure 130/74, pulse 78, respirations 18.  HEENT:  Normocephalic, atraumatic.  PERRLA.  EOMI.  There is mild opacity in the right fundoscopic examination, but no plaque is visible.  No cataracts or glaucoma.  No acrus senilus.  NECK:  Supple.  No JVD, bruits, thyromegaly, or lymphadenopathy.  CHEST:  Symmetrical on inspirations.  No wheezes.  There is coarse left upper lobe rhonchi.  No rales or axillary lymphadenopathy.  No supraclavicular lymphadenopathy.  Respirations:  Nonlabored.  CARDIOVASCULAR:  Regular rate and rhythm.  No murmurs, rubs, or gallops.  ABDOMEN:  Soft, nontender.  Bowel sounds x 4.  No hepatosplenomegaly, iliac or abdominal bruits.  GENITOURINARY:  Deferred.  RECTAL:  Deferred.  EXTREMITIES:  No cyanosis, clubbing, or edema.  SKIN:  No ulcerations.  Warm temperature.  PERIPHERAL PULSES:  Carotids 2+ bilaterally.  Femoral 2+ bilaterally. Popliteal, dorsalis pedis, and posterior tibialis 2+ bilaterally.  NEUROLOGIC:  Nonfocal.  Normal gait.  DTRs 2+ bilaterally.  Muscle strength 5/5.  ASSESSMENT AND PLAN:  A 70 year old white male with a history of right internal carotid artery stenosis who will undergo right carotid endarterectomy on March 28, 2000 at [facility].  [name] has seen and evaluated this patient prior to the admission and has explained the risks and benefits involving the procedure and the patient has agreed to continue DD:  03/29/00 TD:  03/29/00 Job: [id number]

## 2010-08-07 NOTE — Discharge Summary (Signed)
[place]. [facility]  Patient:    [name], [name]                        MRN: [id number] Adm. Date:  [id number] Disc. Date: 03/29/00 Attending:  Alyson Locket Dictator:   Carlye Grippe. CC:         [name], M.D. [facility]                           Discharge Summary  HISTORY OF PRESENT ILLNESS:  This is a 70 year old male patient of Dr. Milinda Antis who was referred to [name] for consideration of carotid endarterectomy after an episode of amaurosis fugax in the right eye on November 2001. Doppler examination confirmed approximately 60-79% stenosis in the right internal carotid artery.  [name] evaluated the patient and the studies and agreed with the recommendations for proceeding with carotid endarterectomy.  PAST MEDICAL HISTORY: 1. Carotid artery disease. 2. Coronary artery disease. 3. History of bronchitis, asthma, and COPD. 4. Continuous tobacco abuse. 5. History of pneumonia 1995. 6. History of hemorrhoidectomy.  PAST SURGICAL HISTORY:  PTCA and stenting coronary 1997 and 2000.  MEDICATIONS: 1. Plavix 75 mg q.d. stopped on December 1. 2. Toprol 100 mg q.d. 3. Accupril 5 mg q.d. 4. Flovent p.r.n. 5. Aspirin 325 mg q.d.  ALLERGIES:  SULFA.  FAMILY HISTORY:  Please see the history and physical done at the time of admission.  SOCIAL HISTORY:  Please see the history and physical done at the time of admission.  REVIEW OF SYSTEMS:  Please see the history and physical done at the time of admission.  PHYSICAL EXAMINATION:  Please see the history and physical done at the time of admission.  HOSPITAL COURSE:  Patient was admitted electively March 28, 2000 and underwent a right carotid endarterectomy with Dacron patch angioplasty. Patient tolerated the procedure well and was taken to the post anesthesia care unit in stable condition.  Postoperative hospital course patient has done well.  He has maintained intact neurological status  without any new deficits. Laboratory values postoperatively are stable with hemoglobin 14, hematocrit 43, normal electrolytes, BUN, and creatinine.  Incision is healing well without postoperative bleeding difficulties or evidence of infection.  He has advanced in a routine manner in regards to postoperative convalescence.  He is afebrile with stable hemodynamics.  He is felt to be stable for discharge on March 29, 2000.  DISCHARGE MEDICATIONS:  As preoperative.  FINAL DIAGNOSES:  Symptomatic right carotid artery disease with amaurosis fugax event in November 2001.  Other diagnoses per previous history include coronary artery disease, history of bronchitis, asthma, chronic obstructive pulmonary disease, history of continued tobacco abuse, history of pneumonia 1995, history of hemorrhoidectomy, history of previous percutaneous transluminal cardiovascular angioplasty and stenting of the coronary arteries.  INSTRUCTIONS:  The patient will receive written instructions in regard to medications, activity, diet, wound care, and follow-up.  FOLLOW-UP:  [name] in two weeks post discharge-Thursday, January 24 at 9:50 a.m. DD:  03/29/00 TD:  03/29/00 Job: [id number]

## 2010-08-07 NOTE — Assessment & Plan Note (Signed)
[facility]                               PULMONARY OFFICE NOTE   NAME:[name], [name]                      MRN:          [id number]  DATE:12/03/2005                            DOB:          [birth year]    [name] is a 70 year old white male, history of chronic obstructive lung  disease, primary emphysema.  The patient notes continued dyspnea with the  recent weather, notes coughing productive of thin and clear mucus.  He was  off cigarettes completely but went back to smoking several months ago, and  is wishing to have his Chantix refilled.  He maintains:  1. Spiriva daily.  2. Advair at 500/50 one spray b.i.d.   PHYSICAL EXAMINATION:  Temp 97, blood pressure 110/70, pulse 83, saturation  94% on room air.  CHEST:  Showed distant breath sounds with prolonged expiratory phase, few  expired wheezes.  CARDIAC:  Exam showed a regular rate and rhythm without S3, normal S1, S2.  ABDOMEN:  Soft, nontender.  EXTREMITIES:  Showed no edema or clubbing.  SKIN:  Clear.  NEUROLOGIC:  Exam was intact.  HEENT:  Exam showed no jugular venous distention, no lymphadenopathy.  The  oropharynx is clear.  NECK:  Supple.   IMPRESSION:  Chronic obstructive lung disease with asthmatic bronchitic  components, with no flare but ongoing smoking use.   PLAN:  For the patient to have the Chantix refilled, and will as well  continue Spiriva as currently dosed.  Refills on the Spiriva were given.  Chantix refills were given as well, and we will see the patient back in  return followup in 4 months.                                   Charlcie Cradle Delford Field, MD, FCCP   PEW/MedQ  DD:  12/03/2005  DT:  12/04/2005  Job #:  [id number]

## 2010-08-07 NOTE — Discharge Summary (Signed)
NAME:  [name], [name] NO.:  [id number]   MEDICAL RECORD NO.:  [id number]          PATIENT TYPE:  INP   LOCATION:  6712                         FACILITY:  [facility]   PHYSICIAN:  Raenette Rover. Felicity Coyer, MDDATE OF BIRTH:  Jan 26, 1941   DATE OF ADMISSION:  05/07/2006  DATE OF DISCHARGE:  05/11/2006                               DISCHARGE SUMMARY   DISCHARGE DIAGNOSES:  1. Left upper lobe pneumonia with acute chronic obstructive pulmonary      disease exacerbation.  2. Tobacco abuse.  3. Dyslipidemia.   HISTORY OF PRESENT ILLNESS:  [name] is a 70 year old white male who  was admitted on May 07, 2006, with a chief complaint of increasing  shortness of breath and chest wall pain.  He had been seen by Dr. Milinda Antis  on February 11 for question of bronchitis, at which time he was given Z-  Pak and prednisone.  He continued to have cough with productive sputum  and blood-tinged thick appearance as well as increasing chest wall pain  and increasing shortness of breath.  He was admitted for further  evaluation and treatment.   PAST MEDICAL HISTORY:  1. Status post right carotid endarterectomy in 2002.  2. Coronary artery disease status post cardiac catheterization in 1997      and in 2000 with stents placed.  3. COPD with bronchitic asthma.  4. Continued tobacco abuse.  5. History of pneumonia in 1995.  6. History of hemorrhoids.  7. History of elevated BPH status post old GU evaluation.   COURSE OF HOSPITALIZATION:  1. Left upper lobe pneumonia with acute COPD exacerbation.  The      patient was admitted and was placed on IV Solu-Medrol as well as      nebulizer treatments.  He was also given Toradol for chest wall      discomfort and IV Rocephin.  He continued to improve clinically.      He was noted to have some mild hypoxia with ambulation earlier in      the admission with saturations down to 89%.  On the day of      discharge, his oxygen saturation with  ambulation on room air were      91-92%.  His resting room air saturation is 94%.  At this time, we      plan to discharge the patient to home with continued p.o.      prednisone taper and p.o. Avelox for a total treatment duration of      10 days.  He is scheduled to follow up with Dr. [name] next      week.  2. Tobacco abuse.  The patient was seen for a smoking cessation      consultation during this admission.  3. Dyslipidemia.  The patient reported that he had not been taking      Zocor.  It was reinforced with the patient that with his history of      coronary artery disease that it is important for him to be      compliant with the statins.  MEDICATIONS AT TIME OF DISCHARGE:  1. Prednisone 50 mg p.o. daily for 2 days and 40 mg p.o. daily for 2      days and 30 mg p.o. daily for 2 days and 20 mg p.o. daily for 2      days, and 10 mg p.o. daily for 2 days, then stop.  2. Avelox 400 mg p.o. daily for 6 additional days.  3. Zocor 20 mg p.o. daily.  4. Metoprolol 25 mg p.o. daily.  5. Quinapril 5 mg p.o. daily.  6. Spiriva 18 mcg 1 puff daily.  7. Advair 1 puff twice daily as before.  8. Aspirin 81 mg p.o. daily.  9. DuoNeb q.6h. as needed.  10.Diclofenac 50 mg p.o. b.i.d. as needed.  11.NitroQuick 0.4 mg under the tongue as needed.   PERTINENT LABORATORY:  At time of discharge:  Hemoglobin 13.8,  hematocrit 40.2, white blood cell count 20.5, platelets 284.  BUN 23,  creatinine 1.06, sodium 131.   DISPOSITION:  The patient will be discharged to home.  He is instructed  to follow up with Dr. Milinda Antis on Wednesday, February 27, at 12 o'clock.      Sandford Craze, NP      Raenette Rover. Felicity Coyer, MD  Electronically Signed    MO/MEDQ  D:  05/11/2006  T:  05/12/2006  Job:  [id number]   cc:   [name], MD

## 2010-08-07 NOTE — Assessment & Plan Note (Signed)
[facility]                              CARDIOLOGY OFFICE NOTE   NAME:Steinbach, JAMEY HARMAN                      MRN:          [id number]  DATE:11/18/2005                            DOB:          04/15/1940    PRIMARY CARE PHYSICIAN:  Dr. [name].   CLINICAL HISTORY:  Mr. Mccadden is 70 years old and had an anterior wall  myocardial infarction in 1997 treated with PCI and subsequently had a PCI of  the circumflex artery in 2002.  He has done well from a cardiac standpoint  since that time and has had no recent chest pain or palpitations.  He does  have chronic cough and chronic bronchitis and shortness of breath related to  this.  Unfortunately, he has continued to smoke.   His past medical history is significant for a carotid endarterectomy.  He  also has a history of hypertension and hyperlipidemia.  We had put him on  simvastatin last year, but he stopped that due to some nausea, but says he  is willing to try it again.   SOCIAL HISTORY:  He is now retired from the [employer].   CURRENT MEDICATIONS:  Toprol, quinapril, aspirin, Spiriva and Advair.   EXAMINATION:  The blood pressure is 138/88 and the pulse 85 and regular.  There was no venous distention.  The carotid pulses were full.  The chest  was clear, but with somewhat decreased breath sounds.  The cardiac rhythm  was regular, but I could hear no murmurs or gallops.  The abdomen was soft  without hepatosplenomegaly.  Peripheral pulses were full and there was no  peripheral edema.   An ECG showed nonspecific ST-T change.   Laboratory studies in March showed a cholesterol of 185, an HDL of 40 and an  LDL of 133.   IMPRESSION:  1. Coronary artery disease, status post anterior wall myocardial      infarction treated with percutaneous coronary intervention in 1997 with      subsequent percutaneous coronary intervention of the circumflex artery      in 2000, now stable.  2. Good left  ventricular function.  3. Hyperlipidemia, borderline intolerant to statin medications.  4. Status post right carotid endarterectomy.  5. Hypertension.  6. Continued cigarette use.  7. Chronic bronchitis.   RECOMMENDATIONS:  I think Mr. Maduro is doing reasonably well, although his  secondary risk factor modification is far from optimal with continued  cigarette use and lack of statin.  I encouraged him to continue to work on  decreasing the cigarettes.  He is also agreeable to going  back on generic simvastatin so we will start him back on 20 a day and get a  lipid and liver profile in 1 month.  I will plan to see him back in 6  months.                               Bruce Elvera Lennox [name], MD, [facility]    BRB/MedQ  DD:  11/18/2005  DT:  11/19/2005  Job #:  [id number]

## 2010-08-07 NOTE — H&P (Signed)
NAME:  DEWAUN, KINZLER NO.:  [id number]   MEDICAL RECORD NO.:  [id number]          PATIENT TYPE:  EMS   LOCATION:  [facility]                         FACILITY:  [facility]   PHYSICIAN:  Rosalyn Gess. Norins, MD  DATE OF BIRTH:  Nov 25, 1940   DATE OF ADMISSION:  05/07/2006  DATE OF DISCHARGE:                              HISTORY & PHYSICAL   CHIEF COMPLAINTS:  Increasing shortness of breath and chest wall pain.   HISTORY OF PRESENT ILLNESS:  Mr. Chaddock is a 70 year old married white  male with a history of COPD who continued to smoke until one week ago.  He saw Dr. [name] on May 02, 2006, for question of bronchitis.  He was given a Z-Pak and prednisone.  The patient continued to have  problems with coughing with productive sputum with a blood tinged, thick  appearance, increasing chest wall pain, and increasing shortness of  breath.  Because of these symptoms, he presents to the emergency  department.  Initial evaluation revealed a left upper lobe infiltrate on  chest x-ray and marked leukocytosis and shortness of breath.  The  patient is now admitted with left upper lobe pneumonia with shortness of  breath for IV antibiotics, breathing treatments, and oxygen support.   PAST MEDICAL HISTORY:  SURGICAL:  The patient is status post right  carotid endarterectomy in 2002.  MEDICAL:  1. CAD status post cardiac catheterization in 1997 and 2000 with      stents placed.  2. COPD with bronchitic asthma.  3. Continued tobacco abuse.  4. History of pneumonia 1995.  5. History of hemorrhoids.  6. History of elevated BPH status post full GU evaluation.   MEDICATIONS ON ADMISSION:  Simvastatin 20 mg daily, Advair 500/50 1  inhalation b.i.d., metoprolol 25 mg daily, quinapril 5 mg daily, Spiriva  18 mcg 1 inhalation daily, albuterol metered dose inhaler p.r.n.,  sublingual nitroglycerin p.r.n., diclofenac 50 mg b.i.d. p.r.n.   DRUG ALLERGIES:  SULFA.   FAMILY HISTORY:   Noncontributory but is significant for sick sinus  syndrome.   SOCIAL HISTORY:  The patient is married.  He has five children but one  is deceased.  He is a retired Company secretary.  He is a social drinker.  He has  a 40-pack-year smoking history.   REVIEW OF SYSTEMS:  The patient has had no constitutional symptoms  except fever.  He denies any neurologic complaints.  He has had no  cardiac complaints except for pleuritic chest pain which is more  pulmonary: No GI complaints.   PHYSICAL EXAMINATION:  VITAL SIGNS:  At admission, temperature was 98.8,  blood pressure 111/61, heart rate 112, respirations were 28, O2 sats 94%  on 2 liters.  GENERAL APPEARANCE:  This is an overweight Caucasian gentleman somewhat  plethoric who is coughing and mildly short of breath.  HEENT: Normocephalic and atraumatic.  EACs and TMs were normal.  Oropharynx without lesions.  Posterior pharynx was clear.  Conjunctiva  is clear was clear.  Pupils equal and reactive.  NECK: Supple.  There is no thyromegaly.  NODES:  No adenopathy was  noted in the cervical, supraclavicular, or  axillary regions.  CHEST:  No CVA tenderness.  No deformities are noted.  LUNGS: The patient has diffuse rhonchi and diffuse wheezing.  I do not  appreciate any consolidation that was negative on egophony.  CARDIOVASCULAR:  2+ radial pulse, he had a quiet precordium with a  regular tachycardia.  I did not appreciate murmurs or rubs.  ABDOMEN:  Protuberant, obese, there was no organosplenomegaly.  There  was positive bowel sounds in all four quadrants.  RECTAL AND PROSTATE:  Exam deferred to exam within the last six months  by urology which, by the patient's report, was normal.  EXTREMITIES:  Without clubbing, cyanosis, edema or deformities.  NEUROLOGIC:  Exam is nonfocal.   DATA BASE:  Chest x-ray revealed a left upper lobe infiltrate.  CBC  revealed hemoglobin 15.6 grams, white count 19,800, with 92% segs, 60%  lymphs, 1% mono.   Chemistries with sodium 134, potassium of 4, chloride  103, CO2 21, BUN 21, creatinine 1.1, glucose 119. The initial CK point  of care was less than 1, initial troponin-I was less than 0.05.   ASSESSMENT/PLAN:  1. Pulmonary: Patient with tobacco abuse, COPD, history of asthmatic      bronchitis in the past, who now presents with infiltrate on chest x-      ray, significant leukocytosis, productive cough and shortness of      breath.  He does not have a fever.  Diagnosis is left upper lobe      pneumonia.  Plan Rocephin 1 gram IV q.24h.  The patient was given      azithromycin in the ER but he has had a full course of treatment      over the past five days and will not be continued on this      medication.  Oxygen at 2 liters per nasal cannula. Albuterol      handheld nebulizer treatments q.i.d.  Solu-Medrol 80 mg IV q.8h. x3      doses then 80 mg q.12h., and then taper as tolerated.  Phenergan      with codeine 1 teaspoon q.6h. for cough, oxygen at 2 liters,      Toradol 30 mg IV q.8h. for pleuritic chest pain.  Sputum to lab for      gram stain and culture.  A.m. laboratory to include CBC.  2. Cardiovascular: Patient with a history of CAD status post stents in      the past.  He had no significant cardiac complaints.  He has      negative cardiac enzymes. Plan 12-lead EKG.  Second set of cardiac      enzymes.  Will not put the patient on telemetry unless EKG or      cardiac enzymes are abnormal.  3. Lipids.  The patient will continue on Zocor.   In summary, this is a 70 year old Caucasian gentleman with COPD now with  pneumonia admitted for IV antibiotics and oxygen support.      Rosalyn Gess Norins, MD  Electronically Signed     MEN/MEDQ  D:  05/07/2006  T:  05/07/2006  Job:  [id number]   cc:   [name], MD  Charlcie Cradle. Delford Field, MD, FCCP  Bruce R. [name], MD, [facility]

## 2010-08-14 ENCOUNTER — Telehealth: Payer: Self-pay | Admitting: Cardiovascular Disease

## 2010-08-14 ENCOUNTER — Other Ambulatory Visit: Payer: Self-pay | Admitting: *Deleted

## 2010-08-14 MED ORDER — ALPRAZOLAM 0.5 MG PO TABS: 0.5000 mg | ORAL_TABLET | Freq: Every evening | ORAL | Status: DC | PRN

## 2010-08-14 NOTE — Telephone Encounter (Signed)
Faxed request from [facility] [address], last refill was 05/13/10 for #[phone].

## 2010-08-14 NOTE — Telephone Encounter (Signed)
Per pt wife is calling, C/O chest pain , sob, would like for him to be seen today if possible. Marland Kitchen

## 2010-08-14 NOTE — Telephone Encounter (Signed)
Medication phoned to [facility] [address] [facility] as instructed.  

## 2010-08-14 NOTE — Telephone Encounter (Signed)
Patient states that for the past day that he has "not felt right" and has been experiencing mild chest discomfort and SOB with exertion. This pain is also radiating somewhat to his shoulder. He has currently not taken any of his Nitro. He is also working right now. He is also C/O numbness in some of his fingers. Patient does not want to take his Nitroglycerine due to the hypotensive outcome. Dr. Myrtis Ser spoke with Mr. Lindor and does not believe that this is cardiac related. He will see Dr. Clifton James on 08/17/28.

## 2010-08-14 NOTE — Telephone Encounter (Signed)
Px written for call in   

## 2010-08-18 ENCOUNTER — Encounter: Payer: Self-pay | Admitting: Cardiovascular Disease

## 2010-08-18 ENCOUNTER — Ambulatory Visit (INDEPENDENT_AMBULATORY_CARE_PROVIDER_SITE_OTHER): Payer: Medicare Other | Admitting: Cardiovascular Disease

## 2010-08-18 VITALS — BP 138/77 | HR 71 | Resp 14 | Ht 70.0 in | Wt 214.0 lb

## 2010-08-18 DIAGNOSIS — I2510 Atherosclerotic heart disease of native coronary artery without angina pectoris: Secondary | ICD-10-CM

## 2010-08-18 MED ORDER — ISOSORBIDE MONONITRATE ER 30 MG PO TB24: 30.0000 mg | ORAL_TABLET | Freq: Every day | ORAL | Status: DC

## 2010-08-18 NOTE — Progress Notes (Signed)
History of Present Illness:[name] is a 70 yo male, prior patient of [name], with a h/o CAD, s/p Ant MI in 1997 tx'd with stent x 2 to the LAD, bare metal stenting to the CFX in 2002 and PCI in 2009 with placement of 2 separate drug eluting stents in the LAD.  He is followed by [name] for COPD.  He called the office with chest pain and was seen by [name] on 04/02/10. Cardiac cath on 04/14/10 with severe stenosis in the distal AV groove Circumflex and the OM. These were both treated with DES.   He is here today for cardiac follow up. He called in four days ago with c/o CP. He was added onto my schedule today. He describes sharp chest pains, left sided, lasted 15 seconds. Happened 3 times. Over last 24 hours, substernal "sensation". Not pain. This lasts for 1 second. No associated  dyspnea, nausea, diaphoresis. He has not taken his NTG prn. Overall lack of energy. No fever, chills, rigors.   Past Medical History  Diagnosis Date  . COPD (chronic obstructive pulmonary disease)   . CAD (coronary artery disease)   . HTN (hypertension)   . Hyperlipidemia   . Osteoarthritis   . Peripheral vascular disease   . Prostate cancer     Past Surgical History  Procedure Date  . Coronary stent placement 97, 2000    x2  . Doppler echocardiography 1995    neg  . Carotid endarterectomy 03-2000    right  . Colonoscopy 04-2001    polyp  . Cardiovascular stress test 12-2006    Current Outpatient Prescriptions  Medication Sig Dispense Refill  . albuterol (VENTOLIN HFA) 108 (90 BASE) MCG/ACT inhaler Inhale 2 puffs into the lungs every 4 (four) hours as needed for wheezing or shortness of breath.  18 g  6  . ALPRAZolam (XANAX) 0.5 MG tablet Take 1 tablet (0.5 mg total) by mouth at bedtime as needed for sleep or anxiety.  30 tablet  0  . aspirin 81 MG tablet Take 81 mg by mouth daily.        . carvedilol (COREG) 3.125 MG tablet Take 3.125 mg by mouth 2 (two) times daily with a meal.        .  clopidogrel (PLAVIX) 75 MG tablet Take 75 mg by mouth daily.        . diclofenac (VOLTAREN) 50 MG EC tablet Take 50 mg by mouth as needed.       . Fluticasone-Salmeterol (ADVAIR DISKUS) 500-50 MCG/DOSE AEPB Inhale 1 puff into the lungs every 12 (twelve) hours.        Marland Kitchen ibuprofen (ADVIL,MOTRIN) 200 MG tablet Take 200 mg by mouth every 6 (six) hours as needed.        Marland Kitchen ipratropium-albuterol (DUONEB) 0.5-2.5 (3) MG/3ML SOLN Take 3 mLs by nebulization every 6 (six) hours as needed.        . nitroGLYCERIN (NITROSTAT) 0.4 MG SL tablet Place 0.4 mg under the tongue every 5 (five) minutes as needed.        . quinapril (ACCUPRIL) 10 MG tablet Take 10 mg by mouth every other day.        . Tamsulosin HCl (FLOMAX) 0.4 MG CAPS Take 0.4 mg by mouth every other day.        . tiotropium (SPIRIVA) 18 MCG inhalation capsule Place 18 mcg into inhaler and inhale daily.          Allergies  Allergen Reactions  .  Sulfonamide Derivatives     History   Social History  . Marital Status: Married    Spouse Name: N/A    Number of Children: N/A  . Years of Education: N/A   Occupational History  . firefighter    Social History Main Topics  . Smoking status: Former Smoker -- 2.0 packs/day for 50 years    Types: Cigarettes    Quit date: 02/22/2010  . Smokeless tobacco: Not on file  . Alcohol Use: Not on file  . Drug Use: Not on file  . Sexually Active: Not on file   Other Topics Concern  . Not on file   Social History Narrative  . No narrative on file    Family History  Problem Relation Age of Onset  . Stroke Father   . Heart disease Mother     pacemaker  . Other Brother     benign brain tumor    Review of Systems:  As stated in the HPI and otherwise negative.   BP 138/77  Pulse 71  Resp 14  Ht 5\' 10"  (1.778 m)  Wt 214 lb (97.07 kg)  BMI 30.71 kg/m2  Physical Examination: General: Well developed, well nourished, NAD HEENT: OP clear, mucus membranes moist SKIN: warm, dry. No  rashes. Neuro: No focal deficits Musculoskeletal: Muscle strength 5/5 all ext Psychiatric: Mood and affect normal Neck: No JVD, no carotid bruits, no thyromegaly, no lymphadenopathy. Lungs:Clear bilaterally, no wheezes, rhonci, crackles Cardiovascular: Regular rate and rhythm. No murmurs, gallops or rubs. Abdomen:Soft. Bowel sounds present. Non-tender.  Extremities: No lower extremity edema. Pulses are 1 + in the bilateral DP/PT.  EKG: NSR, rate 71 bpm. LAD. No ST changes c/w ischemia.

## 2010-08-18 NOTE — Patient Instructions (Signed)
Your physician recommends that you schedule a follow-up appointment in: 6 months with Dr. McAlhany.  

## 2010-08-18 NOTE — Assessment & Plan Note (Signed)
His chest pain is atypical and not similar to his presentation before stents were needed at various times over the past few years. I do not think that an ischemic workup is necessary at this time. Will add Imdur 30 mg po QDaily. Continue other meds.

## 2010-08-20 ENCOUNTER — Telehealth: Payer: Self-pay | Admitting: Pulmonary Disease

## 2010-08-20 ENCOUNTER — Encounter: Payer: Self-pay | Admitting: Adult Health

## 2010-08-20 ENCOUNTER — Ambulatory Visit (INDEPENDENT_AMBULATORY_CARE_PROVIDER_SITE_OTHER): Payer: Medicare Other | Admitting: Adult Health

## 2010-08-20 DIAGNOSIS — J441 Chronic obstructive pulmonary disease with (acute) exacerbation: Secondary | ICD-10-CM

## 2010-08-20 MED ORDER — MOXIFLOXACIN HCL 400 MG PO TABS: 400.0000 mg | ORAL_TABLET | Freq: Every day | ORAL | Status: AC

## 2010-08-20 MED ORDER — PREDNISONE 10 MG PO TABS: ORAL_TABLET | ORAL | Status: AC

## 2010-08-20 NOTE — Patient Instructions (Addendum)
Avelox 400mg daily for 7 days  Mucinex DM Twice daily  As needed   Prednisone taper over next week.  Please contact office for sooner follow up if symptoms do not improve or worsen or seek emergency care  Fluids and rest  follow up Dr. Byrum as planned and As needed     

## 2010-08-20 NOTE — Telephone Encounter (Signed)
Pt scheduled to see TP today at 3:15 pm. Confirmed with the patient.

## 2010-08-21 NOTE — Assessment & Plan Note (Signed)
Avelox 400mg  daily for 7 days  Mucinex DM Twice daily  As needed   Prednisone taper over next week.  Please contact office for sooner follow up if symptoms do not improve or worsen or seek emergency care  Fluids and rest  follow up [name] as planned and As needed

## 2010-08-21 NOTE — Progress Notes (Signed)
Subjective:    Patient ID: [name], male    DOB: Dec 19, 1940, 70 y.o.   MRN: [id number]  HPI  70 yo smoker (~100pk-yrs), hx of CAD/PTCI, HTN,  prostate CA. Dx with COPD around 10 yrs ago. Last seen 2007 by PW. Maintanance was Advair bid. Previously on Spiriva, stopped in 2008.  Referred for worsening dyspnea, cough prod of dark phlegm by [name]. She treated him for AE-COPD, didn't improve fully. Spiriva readded to Advair in 8/11. His breathing has improved some on the combination. Uses DuoNebs as needed, recently qam. Uses Ventolin occas. Able to work, paces himself. Sometimes limited by breathing w heavy exertion. Has had side effects from Chantix before.   ROV 01/02/10 -- follows up for his dyspnea and COPD. He believes that Spiriva has helped his breathing, was added to Advair just before I saw him. Also gave doxy to treat a residual bronchitis. His cough may be a bit better, although still makes mucous every day. Had PFT today, FEV1 47% predicted. Still smoking 1/2 pk day, wants to try to stop by Christmas or New Years Day.   ROV 02/20/10 -- returns for COPD, tobacco. Since last visit has been taken to ED on 11/2, treated for AE-COPD and improved. For last week has had URI symptoms, evolving into more SOB, wheezing, dry cough. He has cut down to 2 -3 cig a day. Stopped Advair but restarted because he missed it.   ROV 03/05/10 -- Hx COPD, CAD, active tobacco. Seen 12/2 with an AE, rx pred + doxy. Feels back to baseline. He quit smoking on 12/4, seems to be tolerating it. Still w some cough, frothy white. Very little wheezing. Has decreased SABA use significantly.   ROV 05/13/10 -- COPD, CAD, quit smoking Dec 4, 11! Started to have URI symptoms about 1 week ago. Since then he has had increasd wheeze, SOB, no F/C, no CP, cough prod of greenish phlegm . Tells me that this past January he had CP, was cathed and required PTCI by [name].   ROV 07/24/10 -- COPD, FEV1 1.36 in 10/11. Has been  doing fairly well, some difficulty w breathing in the hot months. Has been using ventolin about 2x a day depending on activity. Remains on Spiriva and Advair. Has not restarted smoking!! Quit x 5 months. Denies any trouble, No exacerbations, No hospitalizations. To see his urologist in June for PSA check.   ACUTE OV 08/20/10 Pt presents for an acute office visit. Complains of increased SOB, prod cough with green/brown mucus, right sided pain under ribs and toward back, weakness x1week, worse x2days . Mucinex is not helping. Cough is very aggravating. Mucus is thick and has turned green last few days. HE feels he is getting worse. Pain along right ribs with inspiration. No hemoptysis.    Review of Systems Constitutional:   No  weight loss, night sweats,   +Fevers, chills, fatigue, or  lassitude.  HEENT:   No headaches,  Difficulty swallowing,  Tooth/dental problems, or  Sore throat,                No sneezing, itching, ear ache, nasal congestion, post nasal drip,   CV:  No chest pain,  Orthopnea, PND, swelling in lower extremities, anasarca, dizziness, palpitations, syncope.   GI  No heartburn, indigestion, abdominal pain, nausea, vomiting, diarrhea, change in bowel habits, loss of appetite, bloody stools.   Resp:   No coughing up of blood.     Marland Kitchen  No chest wall deformity  Skin: no rash or lesions.  GU: no dysuria, change in color of urine, no urgency or frequency.  No flank pain, no hematuria   MS:  No joint pain or swelling.  No decreased range of motion.    Psych:  No change in mood or affect. No depression or anxiety.            Objective:   Physical Exam  Gen: Pleasant, well-nourished, in no distress,  normal affect  ENT: No lesions,  mouth clear,  oropharynx clear, no postnasal drip  Neck: No JVD, no TMG, no carotid bruits  Lungs: No use of accessory muscles, no dullness to percussion, Coarse BS w/ few rhonchi  Cardiovascular: RRR, heart sounds normal, no murmur or  gallops, no peripheral edema  Musculoskeletal: No deformities, no cyanosis or clubbing  Neuro: alert, non focal  Skin: Warm, no lesions or rashes       Assessment & Plan:

## 2010-10-06 ENCOUNTER — Other Ambulatory Visit: Payer: Self-pay | Admitting: *Deleted

## 2010-10-06 MED ORDER — ALPRAZOLAM 0.5 MG PO TABS: 0.5000 mg | ORAL_TABLET | Freq: Every evening | ORAL | Status: DC | PRN

## 2010-10-06 NOTE — Telephone Encounter (Signed)
Last refilled 08/14/10.

## 2010-10-06 NOTE — Telephone Encounter (Signed)
Medication phoned to [facility] as instructed.

## 2010-10-06 NOTE — Telephone Encounter (Signed)
Px written for call in   

## 2010-10-13 ENCOUNTER — Other Ambulatory Visit: Payer: Self-pay | Admitting: Cardiovascular Disease

## 2010-10-13 DIAGNOSIS — I6529 Occlusion and stenosis of unspecified carotid artery: Secondary | ICD-10-CM

## 2010-10-15 ENCOUNTER — Ambulatory Visit (INDEPENDENT_AMBULATORY_CARE_PROVIDER_SITE_OTHER): Payer: Medicare Other | Admitting: Adult Health

## 2010-10-15 ENCOUNTER — Telehealth: Payer: Self-pay | Admitting: Adult Health

## 2010-10-15 ENCOUNTER — Other Ambulatory Visit: Payer: Self-pay | Admitting: Adult Health

## 2010-10-15 ENCOUNTER — Encounter: Payer: Self-pay | Admitting: Adult Health

## 2010-10-15 VITALS — BP 126/78 | HR 84 | Temp 98.1°F | Ht 70.0 in | Wt 212.0 lb

## 2010-10-15 DIAGNOSIS — J441 Chronic obstructive pulmonary disease with (acute) exacerbation: Secondary | ICD-10-CM

## 2010-10-15 MED ORDER — AMOXICILLIN-POT CLAVULANATE 875-125 MG PO TABS: 1.0000 | ORAL_TABLET | Freq: Two times a day (BID) | ORAL | Status: AC

## 2010-10-15 MED ORDER — PREDNISONE 10 MG PO TABS: ORAL_TABLET | ORAL | Status: DC

## 2010-10-15 NOTE — Progress Notes (Signed)
Subjective:    Patient ID: [name], male    DOB: [birth year], 70 y.o.   MRN: [id number]  HPI  70 yo smoker (~100pk-yrs), hx of CAD/PTCI, HTN,  prostate CA. Dx with COPD around 10 yrs ago. Last seen 2007 by PW. Maintanance was Advair bid. Previously on Spiriva, stopped in 2008.  Referred for worsening dyspnea, cough prod of dark phlegm by Dr Milinda Antis. She treated him for AE-COPD, didn't improve fully. Spiriva readded to Advair in 8/11. His breathing has improved some on the combination. Uses DuoNebs as needed, recently qam. Uses Ventolin occas. Able to work, paces himself. Sometimes limited by breathing w heavy exertion. Has had side effects from Chantix before.   ROV 01/02/10 -- follows up for his dyspnea and COPD. He believes that Spiriva has helped his breathing, was added to Advair just before I saw him. Also gave doxy to treat a residual bronchitis. His cough may be a bit better, although still makes mucous every day. Had PFT today, FEV1 47% predicted. Still smoking 1/2 pk day, wants to try to stop by Christmas or New Years Day.   ROV 02/20/10 -- returns for COPD, tobacco. Since last visit has been taken to ED on 11/2, treated for AE-COPD and improved. For last week has had URI symptoms, evolving into more SOB, wheezing, dry cough. He has cut down to 2 -3 cig a day. Stopped Advair but restarted because he missed it.   ROV 03/05/10 -- Hx COPD, CAD, active tobacco. Seen 12/2 with an AE, rx pred + doxy. Feels back to baseline. He quit smoking on 12/4, seems to be tolerating it. Still w some cough, frothy white. Very little wheezing. Has decreased SABA use significantly.   ROV 05/13/10 -- COPD, CAD, quit smoking Dec 4, 11! Started to have URI symptoms about 1 week ago. Since then he has had increasd wheeze, SOB, no F/C, no CP, cough prod of greenish phlegm . Tells me that this past January he had CP, was cathed and required PTCI by Dr Clifton James.   ROV 07/24/10 -- COPD, FEV1 1.36 in 10/11. Has been  doing fairly well, some difficulty w breathing in the hot months. Has been using ventolin about 2x a day depending on activity. Remains on Spiriva and Advair. Has not restarted smoking!! Quit x 5 months. Denies any trouble, No exacerbations, No hospitalizations. To see his urologist in June for PSA check.   ACUTE OV 08/20/10 Pt presents for an acute office visit. Complains of increased SOB, prod cough with green/brown mucus, right sided pain under ribs and toward back, weakness x1week, worse x2days . Mucinex is not helping. Cough is very aggravating. Mucus is thick and has turned green last few days. HE feels he is getting worse. Pain along right ribs with inspiration. No hemoptysis. >>tx w/ avelox and steroids taper  ACUTE OV 10/15/2010  Pt presents for an acute office visit. Complains of increased SOB, wheezing, occasional prod cough with green mucus x2weeks, worse x5days . Doing well until this last week. OTC not helping. Wheezing getting worse. Using SABA /nebs more often.  No ER or hospitalizations since last ov. This is his 3rd COPD flare this year. Neg fever or hemoptysis .    Review of Systems Constitutional:   No  weight loss, night sweats,   + , fatigue, or  lassitude.  HEENT:   No headaches,  Difficulty swallowing,  Tooth/dental problems, or  Sore throat,  No sneezing, itching, ear ache, nasal congestion, post nasal drip,   CV:  No chest pain,  Orthopnea, PND, swelling in lower extremities, anasarca, dizziness, palpitations, syncope.   GI  No heartburn, indigestion, abdominal pain, nausea, vomiting, diarrhea, change in bowel habits, loss of appetite, bloody stools.   Resp:   No coughing up of blood.     Marland Kitchen  No chest wall deformity  Skin: no rash or lesions.  GU: no dysuria, change in color of urine, no urgency or frequency.  No flank pain, no hematuria   MS:  No joint pain or swelling.  No decreased range of motion.    Psych:  No change in mood or affect. No  depression or anxiety.            Objective:   Physical Exam  Gen: Pleasant, well-nourished, in no distress,  normal affect  ENT: No lesions,  mouth clear,  oropharynx clear, no postnasal drip--upper airway psuedowheeze   Neck: No JVD, no TMG, no carotid bruits  Lungs: No use of accessory muscles, no dullness to percussion, Coarse BS w/ few rhonchi, tr exp wheeze   Cardiovascular: RRR, heart sounds normal, no murmur or gallops, no peripheral edema  Musculoskeletal: No deformities, no cyanosis or clubbing  Neuro: alert, non focal  Skin: Warm, no lesions or rashes       Assessment & Plan:

## 2010-10-15 NOTE — Telephone Encounter (Signed)
Pt calling again says that the pharmacy didn't get the rx for his prednisone.//[facility] [address][name]

## 2010-10-15 NOTE — Assessment & Plan Note (Signed)
Recurrent COPD exacerbation  Would consider changing his ACE inhibitor to ARB if possible -will leave this to cards  He has upper airway psuedowheezing and recurrent wheeze/ cough -with his underlying copd  Believe ace is aggravating his underlying condition.  Have discussed Daliresp but will hold for now until off ACE and rx cost is another issue he has with  High cost of Advair and Spiriva .   Plan :   Augmentin 875mg  Twice daily  For 7 days -take with food.  Mucinex DM Twice daily  As needed   Prednisone taper over next week.  Please contact office for sooner follow up if symptoms do not improve or worsen or seek emergency care  Fluids and rest  follow up [name] in 6 weeks and As needed

## 2010-10-15 NOTE — Progress Notes (Signed)
Addended by: Julio Sicks on: 10/15/2010 04:19 PM   Modules accepted: Orders

## 2010-10-15 NOTE — Telephone Encounter (Signed)
Called and spoke with pt. Pt aware rx sent to pharmacy.   

## 2010-10-15 NOTE — Patient Instructions (Signed)
Augmentin 875mg  Twice daily  For 7 days -take with food.  Mucinex DM Twice daily  As needed   Prednisone taper over next week.  Please contact office for sooner follow up if symptoms do not improve or worsen or seek emergency care  Fluids and rest  follow up [name] in 6 weeks and As needed

## 2010-10-15 NOTE — Telephone Encounter (Signed)
Sent rx  Please notify

## 2010-10-15 NOTE — Telephone Encounter (Signed)
Error.[name] ° °

## 2010-10-15 NOTE — Telephone Encounter (Signed)
Called and spoke with pt. Pt was seen by TP today and patient instructions state to take pred taper but pt states when he went to pharmacy to pick this up, it had not been called in. Only the abx was called in.  TP, please advise.  Thanks.

## 2010-10-16 ENCOUNTER — Encounter (INDEPENDENT_AMBULATORY_CARE_PROVIDER_SITE_OTHER): Payer: Medicare Other | Admitting: *Deleted

## 2010-10-16 ENCOUNTER — Other Ambulatory Visit: Payer: Self-pay | Admitting: *Deleted

## 2010-10-16 DIAGNOSIS — I6529 Occlusion and stenosis of unspecified carotid artery: Secondary | ICD-10-CM

## 2010-10-16 MED ORDER — CLOPIDOGREL BISULFATE 75 MG PO TABS: 75.0000 mg | ORAL_TABLET | Freq: Every day | ORAL | Status: DC

## 2010-10-19 ENCOUNTER — Encounter: Payer: Self-pay | Admitting: Cardiovascular Disease

## 2010-10-20 ENCOUNTER — Telehealth: Payer: Self-pay | Admitting: Emergency Medicine

## 2010-10-20 ENCOUNTER — Other Ambulatory Visit: Payer: Self-pay | Admitting: Family Medicine

## 2010-10-20 MED ORDER — PREDNISONE 10 MG PO TABS: ORAL_TABLET | ORAL | Status: DC

## 2010-10-20 NOTE — Telephone Encounter (Signed)
Spoke with pt - still with dyspnea, wheezing, cough. He was placed on an 8 day rapid taper of pred, augmentin, today is day 5. Suspect that this taper was too quick.   Will order a more extended taper. He has scheduled f/u w me in Sept.

## 2010-10-20 NOTE — Telephone Encounter (Signed)
Pt seen by TP 7.26.12 for COPD exacerbation > given augmentin x7days and pred taper.  Called spoke with patient who has been taking the prednisone and augmentin x5days but reports no real improvement w/ wheezing "real bad", SOB, prod cough with clearing green mucus, all symptoms worse with lying down.  Denies f/c/s or pain.   [facility].  Dr [name] please advise, thanks!  Allergies  Allergen Reactions  . Sulfonamide Derivatives

## 2010-10-21 NOTE — Telephone Encounter (Signed)
Will send electronically.

## 2010-10-21 NOTE — Telephone Encounter (Signed)
[facility] electronically request refill on [facility]one[facility]. Filled 10/20/09. Please advise.

## 2010-11-09 ENCOUNTER — Telehealth: Payer: Self-pay | Admitting: Cardiovascular Disease

## 2010-11-09 NOTE — Telephone Encounter (Signed)
Pt is having a fluttering sensation mid sternum and over the weekend he almost passed out and per Dr. Clifton James he is call you when he has a problem and talk about it

## 2010-11-09 NOTE — Telephone Encounter (Signed)
Recently, patient had noticed fluttering in his chest. They only last a few seconds. Over the weekend, he had an episode and felt like he was going to pass out. It is hard for him to catch his breath when he has these episodes. I did advise him to cut back on caffeine. No CP. 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 Informed patient that we could order a monitor but it might be a few days before we could put it on. He would like to see Dr. Clifton James in the office first. I have scheduled him for tomorrow for 9:15 am.

## 2010-11-10 ENCOUNTER — Encounter (INDEPENDENT_AMBULATORY_CARE_PROVIDER_SITE_OTHER): Payer: Medicare Other

## 2010-11-10 ENCOUNTER — Encounter: Payer: Self-pay | Admitting: Cardiovascular Disease

## 2010-11-10 ENCOUNTER — Ambulatory Visit (INDEPENDENT_AMBULATORY_CARE_PROVIDER_SITE_OTHER): Payer: Medicare Other | Admitting: Cardiovascular Disease

## 2010-11-10 VITALS — BP 118/74 | HR 94 | Ht 70.0 in | Wt 203.0 lb

## 2010-11-10 DIAGNOSIS — R0989 Other specified symptoms and signs involving the circulatory and respiratory systems: Secondary | ICD-10-CM

## 2010-11-10 DIAGNOSIS — R002 Palpitations: Secondary | ICD-10-CM

## 2010-11-10 DIAGNOSIS — I2510 Atherosclerotic heart disease of native coronary artery without angina pectoris: Secondary | ICD-10-CM

## 2010-11-10 LAB — CBC WITH DIFFERENTIAL/PLATELET
Basophils Relative: 0.8 % (ref 0.0–3.0)
Eosinophils Absolute: 0.1 10*3/uL (ref 0.0–0.7)
Eosinophils Relative: 1.2 % (ref 0.0–5.0)
HCT: 45.5 % (ref 39.0–52.0)
Lymphs Abs: 0.8 10*3/uL (ref 0.7–4.0)
MCHC: 34 g/dL (ref 30.0–36.0)
MCV: 100.6 fl — ABNORMAL HIGH (ref 78.0–100.0)
Monocytes Absolute: 0.7 10*3/uL (ref 0.1–1.0)
Neutro Abs: 9 10*3/uL — ABNORMAL HIGH (ref 1.4–7.7)
RBC: 4.52 Mil/uL (ref 4.22–5.81)
WBC: 10.7 10*3/uL — ABNORMAL HIGH (ref 4.5–10.5)

## 2010-11-10 LAB — BASIC METABOLIC PANEL
CO2: 27 mEq/L (ref 19–32)
Chloride: 102 mEq/L (ref 96–112)
Creatinine, Ser: 1.1 mg/dL (ref 0.4–1.5)
Potassium: 3.8 mEq/L (ref 3.5–5.1)

## 2010-11-10 NOTE — Patient Instructions (Signed)
Your physician recommends that you schedule a follow-up appointment in 6 months  Your physician has recommended that you wear a 48 hour holter monitor. Holter monitors are medical devices that record the heart's electrical activity. Doctors most often use these monitors to diagnose arrhythmias. Arrhythmias are problems with the speed or rhythm of the heartbeat. The monitor is a small, portable device. You can wear one while you do your normal daily activities. This is usually used to diagnose what is causing palpitations/syncope (passing out).

## 2010-11-10 NOTE — Assessment & Plan Note (Signed)
Stable No changes 

## 2010-11-10 NOTE — Progress Notes (Signed)
History of Present Illness:[name] is a 70 yo male, prior patient of [name], with a h/o CAD, s/p Ant MI in 1997 tx'd with stent x 2 to the LAD, bare metal stenting to the CFX in 2002 and PCI in 2009 with placement of 2 separate drug eluting stents in the LAD. He is followed by [name] for COPD. He called the office with chest pain and was seen by [name] on 04/02/10. Cardiac cath on 04/14/10 with severe stenosis in the distal AV groove Circumflex and the OM. These were both treated with DES.   He is here today for cardiac follow up. I last saw him in May with c/o CP. It seemed atypical.  I added Imdur. He has done well but recently has been having palpitations. He describes this as a fluttering mid sternum. This lasts for several seconds. A few days ago he had an episode that lasted over a minute and he felt dizzy. No chest pain. No SOB. He had been drinking coffee more frequently lately.   Past Medical History  Diagnosis Date  . COPD (chronic obstructive pulmonary disease)   . CAD (coronary artery disease)   . HTN (hypertension)   . Hyperlipidemia   . Osteoarthritis   . Peripheral vascular disease   . Prostate cancer     Past Surgical History  Procedure Date  . Coronary stent placement 97, 2000    x2  . Doppler echocardiography 1995    neg  . Carotid endarterectomy 03-2000    right  . Colonoscopy 04-2001    polyp  . Cardiovascular stress test 12-2006    Current Outpatient Prescriptions  Medication Sig Dispense Refill  . albuterol (VENTOLIN HFA) 108 (90 BASE) MCG/ACT inhaler Inhale 2 puffs into the lungs every 4 (four) hours as needed for wheezing or shortness of breath.  18 g  6  . ALPRAZolam (XANAX) 0.5 MG tablet Take 1 tablet (0.5 mg total) by mouth at bedtime as needed for sleep or anxiety.  30 tablet  0  . aspirin 81 MG tablet Take 81 mg by mouth daily.        . carvedilol (COREG) 3.125 MG tablet Take 3.125 mg by mouth 2 (two) times daily with a meal.        .  clopidogrel (PLAVIX) 75 MG tablet Take 1 tablet (75 mg total) by mouth daily.  30 tablet  6  . Fluticasone-Salmeterol (ADVAIR DISKUS) 500-50 MCG/DOSE AEPB Inhale 1 puff into the lungs every 12 (twelve) hours.        Marland Kitchen ibuprofen (ADVIL,MOTRIN) 200 MG tablet Take 200 mg by mouth every 6 (six) hours as needed.        Marland Kitchen ipratropium-albuterol (DUONEB) 0.5-2.5 (3) MG/3ML SOLN USE AS DIRECTED  270 mL  1  . nitroGLYCERIN (NITROSTAT) 0.4 MG SL tablet Place 0.4 mg under the tongue every 5 (five) minutes as needed.        . quinapril (ACCUPRIL) 10 MG tablet Take 10 mg by mouth every other day.        . Tamsulosin HCl (FLOMAX) 0.4 MG CAPS Take 0.4 mg by mouth every other day.        . tiotropium (SPIRIVA) 18 MCG inhalation capsule Place 18 mcg into inhaler and inhale daily.          Allergies  Allergen Reactions  . Sulfonamide Derivatives     History   Social History  . Marital Status: Married    Spouse Name:  N/A    Number of Children: N/A  . Years of Education: N/A   Occupational History  . firefighter    Social History Main Topics  . Smoking status: Former Smoker -- 2.0 packs/day for 50 years    Types: Cigarettes    Quit date: 02/22/2010  . Smokeless tobacco: Not on file  . Alcohol Use: Not on file  . Drug Use: Not on file  . Sexually Active: Not on file   Other Topics Concern  . Not on file   Social History Narrative  . No narrative on file    Family History  Problem Relation Age of Onset  . Stroke Father   . Heart disease Mother     pacemaker  . Other Brother     benign brain tumor    Review of Systems:  As stated in the HPI and otherwise negative.   BP 118/74  Pulse 94  Ht 5\' 10"  (1.778 m)  Wt 203 lb (92.08 kg)  BMI 29.13 kg/m2  Physical Examination: General: Well developed, well nourished, NAD HEENT: OP clear, mucus membranes moist SKIN: warm, dry. No rashes. Neuro: No focal deficits Musculoskeletal: Muscle strength 5/5 all ext Psychiatric: Mood and affect  normal Neck: No JVD, no carotid bruits, no thyromegaly, no lymphadenopathy. Lungs:Clear bilaterally, no wheezes, rhonci, crackles Cardiovascular: Regular rate and rhythm. No murmurs, gallops or rubs. Abdomen:Soft. Bowel sounds present. Non-tender.  Extremities: No lower extremity edema. Pulses are 2 + in the bilateral DP/PT.  EKG:NSR, rate 92 bpm. Non-specific ST and T wave changes.

## 2010-11-10 NOTE — Assessment & Plan Note (Signed)
Likely premature beats. Will get 48 hour Holter monitor. Will check BMET, CBC and TSH today.

## 2010-11-17 ENCOUNTER — Telehealth: Payer: Self-pay | Admitting: Cardiovascular Disease

## 2010-11-17 NOTE — Telephone Encounter (Signed)
Test results

## 2010-11-17 NOTE — Telephone Encounter (Signed)
I [name] call him once the monitor has been reviewed.

## 2010-11-17 NOTE — Telephone Encounter (Signed)
Patient aware of monitor results 

## 2010-11-30 ENCOUNTER — Encounter: Payer: Self-pay | Admitting: Emergency Medicine

## 2010-11-30 ENCOUNTER — Ambulatory Visit (INDEPENDENT_AMBULATORY_CARE_PROVIDER_SITE_OTHER): Payer: Medicare Other | Admitting: Emergency Medicine

## 2010-11-30 VITALS — BP 120/60 | HR 87 | Temp 98.1°F | Ht 69.0 in | Wt 206.6 lb

## 2010-11-30 DIAGNOSIS — J449 Chronic obstructive pulmonary disease, unspecified: Secondary | ICD-10-CM

## 2010-11-30 MED ORDER — LORATADINE 10 MG PO TABS: 10.0000 mg | ORAL_TABLET | Freq: Every day | ORAL | Status: DC

## 2010-11-30 NOTE — Patient Instructions (Signed)
We will change Advair to Symbicort 2 puffs twice a day for the next month Take loratadine 10mg  (Claritin) daily for the next month Continue your Spiriva Walking oximetry today showed that you may benefit from oxygen with exertion.  Follow with Dr [name] in 1 month

## 2010-11-30 NOTE — Progress Notes (Signed)
Subjective:    Patient ID: [name], male    DOB: 26-May-1940, 70 y.o.   MRN: [id number] HPI 70 yo smoker (~100pk-yrs), hx of CAD/PTCI, HTN,  prostate CA. Dx with COPD around 10 yrs ago. Last seen 2007 by PW. Maintanance was Advair bid. Previously on Spiriva, stopped in 2008.   ROV 05/13/10 -- COPD, CAD, quit smoking Dec 4, 11! Started to have URI symptoms about 1 week ago. Since then he has had increasd wheeze, SOB, no F/C, no CP, cough prod of greenish phlegm . Tells me that this past January he had CP, was cathed and required PTCI by Dr Clifton James.   ROV 07/24/10 -- COPD, FEV1 1.36 in 10/11. Has been doing fairly well, some difficulty w breathing in the hot months. Has been using ventolin about 2x a day depending on activity. Remains on Spiriva and Advair. Has not restarted smoking!! Quit x 5 months. Denies any trouble, No exacerbations, No hospitalizations. To see his urologist in June for PSA check.   ACUTE OV 08/20/10 Pt presents for an acute office visit. Complains of increased SOB, prod cough with green/brown mucus, right sided pain under ribs and toward back, weakness x1week, worse x2days . Mucinex is not helping. Cough is very aggravating. Mucus is thick and has turned green last few days. HE feels he is getting worse. Pain along right ribs with inspiration. No hemoptysis. >>tx w/ avelox and steroids taper  ACUTE OV 10/15/2010  Pt presents for an acute office visit. Complains of increased SOB, wheezing, occasional prod cough with green mucus x2weeks, worse x5days . Doing well until this last week. OTC not helping. Wheezing getting worse. Using SABA /nebs more often.  No ER or hospitalizations since last ov. This is his 3rd COPD flare this year. Neg fever or hemoptysis .   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 Has been seen several times 2012 for cough, wheeze, treated for probable AE-COPD. Presents today for regular f/u. He reports that he stopped his ACE-I, more because he  was feeling drained than to address cough. Over last 2 weeks more clear mucous that he is coughing up. His wheezing may also be slightly worse, he is using albuterol nebs more frequently. Has been SOB when laying supine. Maintenance Advair bid, Spiriva qd. He checks his o2 sats, they have not gone below 90%. Wants to hold off on daliresp due to cost.    Objective:  Physical Exam  Gen: Pleasant, obese man, in no distress,  normal affect  ENT: No lesions,  mouth clear,  oropharynx clear, no postnasal drip-- mild upper airway psuedowheeze   Neck: No JVD, no TMG, no carotid bruits  Lungs: No use of accessory muscles, no dullness to percussion, Coarse BS w/ few rhonchi, trace end-exp wheeze   Cardiovascular: RRR, heart sounds normal, no murmur or gallops, no peripheral edema  Musculoskeletal: No deformities, no cyanosis or clubbing  Neuro: alert, non focal  Skin: Warm, no lesions or rashes   Assessment & Plan:  COPD He has stable soft wheezes, also some superimposed UA noise and wheeze. I don't think he is having an acute exacerbation. ? Whether some of his sx are UA - c/o clear mucous and some wheeze. He stopped ACE-I on his own.  - add loratedine - continue spiriva and consider change Advair to Symbicort to avoid UA irritation - asked him to notify Dr Clifton James about stopping ACE-I - walking oximetry today dropped to 88% with quick pace  on third lap; discussed O2 with him today, he wants to defer.  - rov 1 month to assess status.

## 2010-11-30 NOTE — Assessment & Plan Note (Addendum)
He has stable soft wheezes, also some superimposed UA noise and wheeze. I don't think he is having an acute exacerbation. ? Whether some of his sx are UA - c/o clear mucous and some wheeze. He stopped ACE-I on his own.  - add loratedine - continue spiriva and consider change Advair to Symbicort to avoid UA irritation - asked him to notify Dr Clifton James about stopping ACE-I - walking oximetry today dropped to 88% with quick pace on third lap; discussed O2 with him today, he wants to defer.  - rov 1 month to assess status.

## 2010-12-01 ENCOUNTER — Other Ambulatory Visit: Payer: Self-pay

## 2010-12-01 MED ORDER — ALPRAZOLAM 0.5 MG PO TABS: 0.5000 mg | ORAL_TABLET | Freq: Every evening | ORAL | Status: DC | PRN

## 2010-12-01 NOTE — Telephone Encounter (Signed)
[facility]. Faxed refill request Alprazolam 0.5 mg.Please advise.

## 2010-12-01 NOTE — Telephone Encounter (Signed)
Px written for call in   

## 2010-12-01 NOTE — Telephone Encounter (Signed)
Medication phoned to pharmacy.  

## 2010-12-08 ENCOUNTER — Telehealth: Payer: Self-pay | Admitting: Emergency Medicine

## 2010-12-08 MED ORDER — PREDNISONE 10 MG PO TABS: ORAL_TABLET | ORAL | Status: DC

## 2010-12-08 NOTE — Telephone Encounter (Signed)
Per Carron Curie, CMA..the patient C/o increased productive cough with yellow phlegm and increased sob since stopping Prednisone 1 month ago. Pt is taking S taking Symbicort 160/4.5, spiriva, Claritin and Ventolin with no relief. He states the only thing that helps is Prednisone. Pls advise.

## 2010-12-08 NOTE — Telephone Encounter (Signed)
Per [name]...call in Prednisone taper:  40 mg x 3 days, 30 mg x 3 days, 20 mg x 3 days, 10 mg x 3 days, then stop.  Pt is aware and rx has been sent to his pharmacy.

## 2010-12-17 LAB — CBC
HCT: 46.5
Hemoglobin: 16
MCHC: 34.5
RBC: 4.76

## 2010-12-17 LAB — BASIC METABOLIC PANEL
CO2: 27
Calcium: 9.3
GFR calc Af Amer: >60
Potassium: 3.8
Sodium: 139

## 2010-12-22 ENCOUNTER — Inpatient Hospital Stay (HOSPITAL_COMMUNITY)
Admission: EM | Admit: 2010-12-22 | Discharge: 2010-12-24 | DRG: 247 | Disposition: A | Discharge status: Home / Self Care | Payer: Medicare Other | Attending: Cardiology | Admitting: Cardiology

## 2010-12-22 ENCOUNTER — Telehealth: Payer: Self-pay | Admitting: Cardiovascular Disease

## 2010-12-22 ENCOUNTER — Emergency Department (HOSPITAL_COMMUNITY): Payer: Medicare Other

## 2010-12-22 DIAGNOSIS — R079 Chest pain, unspecified: Secondary | ICD-10-CM

## 2010-12-22 DIAGNOSIS — J4489 Other specified chronic obstructive pulmonary disease: Secondary | ICD-10-CM | POA: Diagnosis present

## 2010-12-22 DIAGNOSIS — J449 Chronic obstructive pulmonary disease, unspecified: Secondary | ICD-10-CM | POA: Diagnosis present

## 2010-12-22 DIAGNOSIS — I10 Essential (primary) hypertension: Secondary | ICD-10-CM | POA: Diagnosis present

## 2010-12-22 DIAGNOSIS — Z7982 Long term (current) use of aspirin: Secondary | ICD-10-CM

## 2010-12-22 DIAGNOSIS — I6529 Occlusion and stenosis of unspecified carotid artery: Secondary | ICD-10-CM | POA: Diagnosis present

## 2010-12-22 DIAGNOSIS — I2510 Atherosclerotic heart disease of native coronary artery without angina pectoris: Principal | ICD-10-CM | POA: Diagnosis present

## 2010-12-22 DIAGNOSIS — Z8546 Personal history of malignant neoplasm of prostate: Secondary | ICD-10-CM

## 2010-12-22 DIAGNOSIS — I739 Peripheral vascular disease, unspecified: Secondary | ICD-10-CM | POA: Diagnosis present

## 2010-12-22 DIAGNOSIS — Z87891 Personal history of nicotine dependence: Secondary | ICD-10-CM

## 2010-12-22 DIAGNOSIS — I252 Old myocardial infarction: Secondary | ICD-10-CM

## 2010-12-22 DIAGNOSIS — Z8249 Family history of ischemic heart disease and other diseases of the circulatory system: Secondary | ICD-10-CM

## 2010-12-22 DIAGNOSIS — I2589 Other forms of chronic ischemic heart disease: Secondary | ICD-10-CM | POA: Diagnosis present

## 2010-12-22 LAB — DIFFERENTIAL
Basophils Relative: 1 % (ref 0–1)
Eosinophils Absolute: 0.3 10*3/uL (ref 0.0–0.7)
Eosinophils Relative: 4 % (ref 0–5)
Monocytes Absolute: 0.6 10*3/uL (ref 0.1–1.0)
Monocytes Relative: 8 % (ref 3–12)

## 2010-12-22 LAB — COMPREHENSIVE METABOLIC PANEL
AST: 16 U/L (ref 0–37)
BUN: 11 mg/dL (ref 6–23)
CO2: 29 mEq/L (ref 19–32)
Chloride: 101 mEq/L (ref 96–112)
Creatinine, Ser: 0.95 mg/dL (ref 0.50–1.35)
GFR calc non Af Amer: 82 mL/min — ABNORMAL LOW (ref >90)
Total Bilirubin: 0.3 mg/dL (ref 0.3–1.2)

## 2010-12-22 LAB — POCT I-STAT TROPONIN I: Troponin i, poc: 0 ng/mL (ref 0.00–0.08)

## 2010-12-22 LAB — CBC
MCH: 33.3 pg (ref 26.0–34.0)
MCHC: 34.8 g/dL (ref 30.0–36.0)
Platelets: 227 10*3/uL (ref 150–400)

## 2010-12-22 LAB — CK TOTAL AND CKMB (NOT AT ARMC): Relative Index: INVALID (ref 0.0–2.5)

## 2010-12-22 NOTE — Telephone Encounter (Signed)
Some sob, then chest pains started today with jaw pain pt at work, please call wife, wants him seen in the am, or cell [phone]

## 2010-12-22 NOTE — Telephone Encounter (Signed)
Agree. cdm 

## 2010-12-22 NOTE — Telephone Encounter (Signed)
Pt's wife calling stating she just spoke to husband who is driving to [facility] and started having CP, Jaw pain and SOB--advised pt's wife to call him and have him drive back to [place] and go immediately to [place]--Pt's wife stated she would call him -----Rosann Auerbach notified--nt

## 2010-12-23 DIAGNOSIS — I4949 Other premature depolarization: Secondary | ICD-10-CM

## 2010-12-23 DIAGNOSIS — I2510 Atherosclerotic heart disease of native coronary artery without angina pectoris: Secondary | ICD-10-CM

## 2010-12-23 LAB — CARDIAC PANEL(CRET KIN+CKTOT+MB+TROPI)
CK, MB: 1.9 ng/mL (ref 0.3–4.0)
Relative Index: INVALID (ref 0.0–2.5)
Total CK: 49 U/L (ref 7–232)
Troponin I: <0.3 ng/mL (ref <0.30)
Troponin I: <0.3 ng/mL (ref <0.30)

## 2010-12-23 LAB — BASIC METABOLIC PANEL
BUN: 11 mg/dL (ref 6–23)
Calcium: 9.1 mg/dL (ref 8.4–10.5)
Creatinine, Ser: 0.88 mg/dL (ref 0.50–1.35)
GFR calc Af Amer: >90 mL/min (ref >90)
GFR calc non Af Amer: 85 mL/min — ABNORMAL LOW (ref >90)
Potassium: 3.4 mEq/L — ABNORMAL LOW (ref 3.5–5.1)

## 2010-12-23 LAB — CBC
MCHC: 34 g/dL (ref 30.0–36.0)
MCV: 95.9 fL (ref 78.0–100.0)
Platelets: 211 10*3/uL (ref 150–400)
RDW: 13.1 % (ref 11.5–15.5)
WBC: 6.4 10*3/uL (ref 4.0–10.5)

## 2010-12-23 LAB — POCT ACTIVATED CLOTTING TIME: Activated Clotting Time: 501 seconds

## 2010-12-24 DIAGNOSIS — I200 Unstable angina: Secondary | ICD-10-CM

## 2010-12-24 LAB — BASIC METABOLIC PANEL
Calcium: 8.9 mg/dL (ref 8.4–10.5)
GFR calc Af Amer: >90 mL/min (ref >90)
GFR calc non Af Amer: 88 mL/min — ABNORMAL LOW (ref >90)
Potassium: 4.1 mEq/L (ref 3.5–5.1)
Sodium: 137 mEq/L (ref 135–145)

## 2010-12-24 LAB — CBC
MCHC: 33.8 g/dL (ref 30.0–36.0)
RDW: 13.2 % (ref 11.5–15.5)

## 2010-12-24 NOTE — Cardiovascular Report (Signed)
NAME:  [name], [name] NO.:  [id number]  MEDICAL RECORD NO.:  [id number]  LOCATION:  2504                         FACILITY:  [facility]  PHYSICIAN:  Verne Carrow, MDDATE OF BIRTH:  01-03-1941  DATE OF PROCEDURE:  12/23/2010 DATE OF DISCHARGE:                           CARDIAC CATHETERIZATION   PRIMARY CARE PHYSICIAN:  Marne A. Tower, MD  PROCEDURES PERFORMED: 1. Left heart catheterization. 2. Selective coronary angiography. 3. Left ventricular angiogram. 4. Percutaneous transluminal coronary angioplasty with placement of a     drug-eluting stent in the proximal circumflex artery.  OPERATOR:  Verne Carrow, MD  ARTERIAL ACCESS SITE:  Right radial artery.  INDICATIONS:  This is a 70 year old Caucasian male with a history of coronary artery disease with multiple previous stenting procedures of the coronary arteries, most recently in January 2012 when a drug-eluting stent was placed in the mid circumflex artery and a separate stent was placed in the distal AV groove circumflex artery.  The patient has done well but recently has had several episodes of substernal chest pressure. He was admitted to the hospital last night and had negative cardiac enzymes.  Because of his history of coronary artery disease and symptoms consistent with unstable angina, diagnostic catheterization was planned for today.  PROCEDURE IN DETAIL:  The patient was brought to the main cardiac catheterization laboratory after signing the informed consent for the procedure.  An Freida Busman test was performed on the right wrist and was positive.  The right wrist was prepped and draped in a sterile fashion. Lidocaine 1% was used for local anesthesia.  A 5-French sheath was inserted into the right radial artery without difficulty.  Several rounds of verapamil was given through the sheath.  Initially, we gave 3 mg of verapamil, however, our catheters would not pass easily.  I felt that  there was probably some spasm and more 3 mg of verapamil was given through the sheath.  The patient was given 4500 units of intravenous heparin.  Selective coronary angiography was performed with standard diagnostic catheters.  A pigtail catheter was used to perform a left ventricular angiogram.  The patient tolerated the diagnostic portion of the procedure well.  We then attempted to place a 6-French guide through a 6-French sheath, however, the guide would not pass easily.  Multiple rounds of verapamil and intra-arterial nitroglycerin were given. Ultimately, I decided to switch to a 5-French EBU 3.5 guiding catheter. There was no difficulty passing this guiding catheter through the arm. The left main was engaged.  The patient was given a bolus of Angiomax and a drip was started.  The patient has been maintained on daily Plavix.  When the ACT was greater than 200, I passed a Cougar intracoronary wire down the length of the circumflex artery.  A 2.5 x 12 mm balloon was used to predilate the lesion.  A 3.5 x 12 mm Promus element drug-eluting stent was carefully positioned in the proximal portion of the vessel and deployed without difficulty.  A 4.0 x 8 mm noncompliant balloon was inflated twice inside the stented segment.  The stenosis was taken from 80% to 0%.  There was excellent flow into the distal vessel.  The  guiding catheter and sheath were removed from the arm in the cath lab.  A Terumo hemostasis band was applied over the arteriotomy site.  The patient was taken to the recovery room in stable condition.  HEMODYNAMIC FINDINGS:  Central aortic pressure 145/79.  Left ventricular pressure 150/16/25.  ANGIOGRAPHIC FINDINGS: 1. The left main coronary artery had no evidence of disease. 2. The left anterior descending is a large vessel that coursed to the     apex.  The proximal vessel had mild 30% plaque disease.  There is a     stented segment in the midportion of the vessel with  mild     restenosis.  Just beyond the mid stented segment there is a tubular     50% stenosis which is unchanged in appearance from prior     catheterization.  The stent in the mid to distal portion of the     vessel is patent with no restenosis. 3. The circumflex artery had an ulcerated-appearing 80% proximal     stenosis.  In the mid vessel leading into the obtuse marginal     branch, there is a patent stent with no restenosis.  The distal     portion of the AV groove circumflex has a stent that is patent with     no restenosis. 4. The right coronary artery is a very small-caliber nondominant     vessel with a mid 90% stenosis and distal 70% stenosis.  This is     unchanged from prior catheterization.  This vessel is too small in     caliber for percutaneous intervention. 5. Left ventricular angiogram was performed in the RAO projection and     it showed ejection fraction of 40-45%.  IMPRESSION: 1. Triple-vessel coronary artery disease. 2. Patent stents in circumflex artery and left anterior descending     artery. 3. Severe ulcerated plaque with 80% stenosis in the proximal     circumflex artery, now status post drug-eluting stent placement x1. 4. Mild left ventricular systolic dysfunction.  RECOMMENDATIONS:  We will continue the patient's current medical therapy which includes aspirin, Plavix, and beta blocker.  The patient is not on a statin secondary to complaints of muscle aches in the past.  The patient will be watched closely tonight and discharged home tomorrow if he is stable.     Verne Carrow, MD     CM/MEDQ  D:  12/23/2010  T:  12/23/2010  Job:  [id number]  cc:   [name], MD  Electronically Signed by Verne Carrow MD on 12/24/2010 09:30:12 AM

## 2010-12-25 ENCOUNTER — Telehealth: Payer: Self-pay | Admitting: Cardiovascular Disease

## 2010-12-25 ENCOUNTER — Ambulatory Visit: Payer: Medicare Other | Admitting: Cardiovascular Disease

## 2010-12-25 NOTE — Telephone Encounter (Signed)
Spoke with pt who reports bruising on right inner arm. Bruising starts about 3 inches above radial cath site and goes to about 3 inches above elbow.  No pain or tenderness.  Small bruise at cath site that remains unchanged. Good pulse and capillary refill per pt report.  Pt feels bruise is improving slightly.  I instructed pt to continue to watch and let us know if bruising does not continue to  improve.

## 2010-12-25 NOTE — Telephone Encounter (Signed)
Pt called and stated he has bruising around the elbow area/about 4 inches in diameter.  No pain to the touch.  Please call and advise if this is normal.

## 2010-12-25 NOTE — Telephone Encounter (Signed)
Patient calling back with a back up phone #

## 2010-12-27 NOTE — H&P (Signed)
NAMEJUNIPER, [name] NO.:  [id number]  MEDICAL RECORD NO.:  [id number]  LOCATION:  2022                         FACILITY:  [facility]  PHYSICIAN:  Luis Abed, MD, FACCDATE OF BIRTH:  [birth year]  DATE OF ADMISSION:  12/22/2010 DATE OF DISCHARGE:                             HISTORY & PHYSICAL   Mr. Kluever is a very pleasant gentleman with significant coronary artery disease.  His complete history is outlined below.  His most recent cath was in January 2012.  He had two stents placed at that time.  He saw Dr. Clifton James last in the office on November 10, 2010.  He was not having chest pain.  He had had some palpitations.  He is worn a Holter since that time showing only some PACs.  The patient was stable in general until today, he developed approximately 30 minutes of his classic substernal chest pain with some discomfort in the jaw.  He does not use nitroglycerin at home as it has given him headaches and lowered his blood pressure.  He relax and felt better, but then noticed that with any motion, he had marked shortness of breath.  He then came to the emergency room.  At this point, he is ruled out for myocardial infarction.  PAST MEDICAL HISTORY:  ALLERGIES:  SULFA DRUGS.  MEDICATIONS:  See the medicine reconciliation.  See our orders also. His meds have included: 1. Albuterol. 2. Alprazolam. 3. Aspirin 81. 4. Carvedilol 3.125 b.i.d. 5. Plavix 75 mg daily. 6. Ipratropium  and albuterol. 7. Tamsulosin hydrochloride (Flomax). 8. Spiriva.  He also was on tapering steroids recently.  This ended     several days ago.  This had been given to him by [name] for his     pulmonary disease.  OTHER MEDICAL PROBLEMS:  See the complete list below.  SOCIAL HISTORY:  The patient is married.  He quit smoking in December 2011.  FAMILY HISTORY:  The patient's father died of a stroke.  His mother did have heart disease.  REVIEW OF SYSTEMS:  The patient denies  fever, chills, headache, sweats, rash, change in vision, change in hearing, cough, nausea or vomiting, urinary symptoms.  All other systems are reviewed and are negative.  PHYSICAL EXAMINATION:  VITAL SIGNS:  Blood pressure is 137/85 with a pulse of 81, respirations 18, temperature is 98.3, O2 sat is 93% on 2 liters. GENERAL:  The patient is alert and oriented.  He is oriented to person, time and place.  His wife and daughter are in the room.  He is not having chest discomfort at this time. HEENT:  Head is atraumatic.  He has a left carotid bruit.  There is no jugular venous distention. LUNGS:  Reveal some scattered wheezes.  There is no respiratory distress. CARDIAC:  Reveals S1 and S2.  There are no clicks or significant murmurs. ABDOMEN:  Soft. EXTREMITIES:  There is no peripheral edema. MUSCULOSKELETAL:  There are no musculoskeletal deformities. SKIN:  There are no significant skin rashes.  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  EKG reveals no significant abnormalities.  Hemoglobin is 15.7.  BUN is 11 with a creatinine of 0.95.  Troponin is normal x1.  PROBLEMS: 1. History of prostate cancer, treated. 2. Hyperlipidemia. 3. Hypertension. 4. Severe carotid artery disease.  The patient has had a right carotid     endarterectomy.  Most recently his Doppler in January 2012 revealed     0-39% right internal carotid artery.  He had worsening disease on     the left with 60-79% left internal carotid artery.  He is not     having any symptoms from this. 5. Ejection fraction 60% with inferior hypokinesis by catheterization     in January 2012. 6. Palpitations.  He is not having any right now.  He talked about     this with Dr. Clifton James in August 2012.  Holter monitor on November 17, 2010, revealed normal sinus rhythm.  He had rare premature     ventricular contractions and some scattered premature atrial     contractions.  There was no atrial fibrillation.  His rhythm  can be     followed. 7. Severe chronic obstructive pulmonary disease.  He is followed very     carefully by [name].  He does have some wheezing at this time.     He had a recent prednisone taper that has now been completed. 8. Prednisone therapy, recent taper which has been completed. 9. Question of beta-blocker use.  The patient's family thinks that his     breathing may be worse on carvedilol, then metoprolol.  There is a     theoretical reason why this could be true, although the carvedilol     dose has been very small.  We will use metoprolol at this time     until he is seen by Dr. Clifton James. 10.Coronary artery disease.  The patient had an anterior myocardial     infarction, treated with a stent to the left anterior descending     artery in 1997.  He then had a circumflex stent in 2002.  He had     two stents to the left anterior descending artery in 2009.  In     January 2012, he had drug-eluting stents to the atrioventricular     groove of the circumflex and the obtuse marginal of the circumflex.     He now presents with 30 minutes of his classic chest pain.  With     his history, we must certainly suspect that this is ischemic pain.     Fortunately, it has resolved and he is stable.  He will be     admitted.  He will be kept on his home medicines and IV heparin     will be added.  We will also put him on a small dose of Imdur.  We     will make sure that Dr. Clifton James is aware of his admission.  We     will arrange for him to most probably go ahead with repeat     catheterization tomorrow.  The patient had stopped quinapril on his     own.  He said it made him feel poorly.  I do not feel need to push     towards restarting this as of today and we will ask Dr. Clifton James.     Luis Abed, MD, [facility]     JDK/MEDQ  D:  12/22/2010  T:  12/23/2010  Job:  [id number]  Electronically Signed by Willa Rough MD FACC on 12/27/2010 05:33:43 PM

## 2010-12-29 ENCOUNTER — Encounter: Payer: Self-pay | Admitting: Emergency Medicine

## 2010-12-29 ENCOUNTER — Ambulatory Visit (INDEPENDENT_AMBULATORY_CARE_PROVIDER_SITE_OTHER): Payer: Medicare Other | Admitting: Emergency Medicine

## 2010-12-29 VITALS — BP 120/72 | HR 80 | Temp 97.6°F | Ht 69.0 in | Wt 205.0 lb

## 2010-12-29 DIAGNOSIS — J449 Chronic obstructive pulmonary disease, unspecified: Secondary | ICD-10-CM

## 2010-12-29 MED ORDER — BUDESONIDE-FORMOTEROL FUMARATE 160-4.5 MCG/ACT IN AERO: 2.0000 | INHALATION_SPRAY | Freq: Two times a day (BID) | RESPIRATORY_TRACT | Status: DC

## 2010-12-29 NOTE — Assessment & Plan Note (Signed)
Has had freq exacerbations. Has documented desat with quick walking pace. Still smoking. I don't believe he is going to get any better until he is off cigarettes.  - change Advair to Symbicort - continue Spiriva - discussed O2 - he isn't interested - discussed tobacco cessation, he is trying to cut down but not ready to set quit date.  - rov 3 months

## 2010-12-29 NOTE — Progress Notes (Signed)
Subjective:    Patient ID: [name], male    DOB: September 12, 1940, 70 y.o.   MRN: [id number] HPI 70 yo smoker (~100pk-yrs), hx of CAD/PTCI, HTN,  prostate CA. Dx with COPD around 10 yrs ago. Last seen 2007 by PW. Maintanance was Advair bid. Previously on Spiriva, stopped in 2008.   ROV 05/13/10 -- COPD, CAD, quit smoking Dec 4, 11! Started to have URI symptoms about 1 week ago. Since then he has had increasd wheeze, SOB, no F/C, no CP, cough prod of greenish phlegm . Tells me that this past January he had CP, was cathed and required PTCI by Dr Clifton James.   ROV 07/24/10 -- COPD, FEV1 1.36 in 10/11. Has been doing fairly well, some difficulty w breathing in the hot months. Has been using ventolin about 2x a day depending on activity. Remains on Spiriva and Advair. Has not restarted smoking!! Quit x 5 months. Denies any trouble, No exacerbations, No hospitalizations. To see his urologist in June for PSA check.   ACUTE OV 08/20/10 Pt presents for an acute office visit. Complains of increased SOB, prod cough with green/brown mucus, right sided pain under ribs and toward back, weakness x1week, worse x2days . Mucinex is not helping. Cough is very aggravating. Mucus is thick and has turned green last few days. HE feels he is getting worse. Pain along right ribs with inspiration. No hemoptysis. >>tx w/ avelox and steroids taper  ACUTE OV 10/15/2010  Pt presents for an acute office visit. Complains of increased SOB, wheezing, occasional prod cough with green mucus x2weeks, worse x5days . Doing well until this last week. OTC not helping. Wheezing getting worse. Using SABA /nebs more often.  No ER or hospitalizations since last ov. This is his 3rd COPD flare this year. Neg fever or hemoptysis .   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 Has been seen several times 2012 for cough, wheeze, treated for probable AE-COPD. Presents today for regular f/u. He reports that he stopped his ACE-I, more because he  was feeling drained than to address cough. Over last 2 weeks more clear mucous that he is coughing up. His wheezing may also be slightly worse, he is using albuterol nebs more frequently. Has been SOB when laying supine. Maintenance Advair bid, Spiriva qd. He checks his o2 sats, they have not gone below 90%. Wants to hold off on daliresp due to cost.   ROV 12/29/10 -- Severe COPD, CAD (PTCI by Dr Clifton James). Walking oximetry last visit showed desats but he didn't want to start O2. Tells me that he was seen by Dr Clifton James last week, exertional SOB, CP, weakness. He underwent L cath with PTCI. He reports continued DOE, but CP has resolved, weakness is better. Last time we did experiment changing Advair to Symbicort - he believes that it is at least as good, not necessarily better. Still on Spiriva. He has not restarted quinipril. We treated him prednisone after his last visit here, he says it helped his stamina, may have made breathing better. Smoking 5-10 cig a day.    Objective:  Physical Exam  Gen: Pleasant, obese man, in no distress,  normal affect  ENT: No lesions,  mouth clear,  oropharynx clear, no postnasal drip-- mild upper airway psuedowheeze on insp and exp  Neck: No JVD, no TMG, no carotid bruits  Lungs: No use of accessory muscles, no dullness to percussion, Coarse BS w/ few rhonchi, trace end-exp wheeze   Cardiovascular: RRR, heart  sounds normal, no murmur or gallops, no peripheral edema  Musculoskeletal: No deformities, no cyanosis or clubbing  Neuro: alert, non focal  Skin: Warm, no lesions or rashes   Assessment & Plan:  COPD Has had freq exacerbations. Has documented desat with quick walking pace. Still smoking. I don't believe he is going to get any better until he is off cigarettes.  - change Advair to Symbicort - continue Spiriva - discussed O2 - he isn't interested - discussed tobacco cessation, he is trying to cut down but not ready to set quit date.  - rov 3  months

## 2010-12-29 NOTE — Patient Instructions (Signed)
Continue your Spiriva Stop Advair and start Symbicort 160/4.104mcg, 2 puffs twice a day We discussed the fact that you would benefit from oxygen with heavy exertion We discussed in detail your smoking, and that you need to stop. We will help you in any way we can.  Follow up with Dr [name] in 3 months

## 2010-12-31 ENCOUNTER — Encounter: Payer: Self-pay | Admitting: *Deleted

## 2011-01-05 NOTE — Discharge Summary (Signed)
[name], Rogers NO.:  [id number]  MEDICAL RECORD NO.:  [id number]  LOCATION:  2504                         FACILITY:  [facility]  PHYSICIAN:  Noralyn Pick. Eden Emms, MD, FACCDATE OF BIRTH:  05-16-1940  DATE OF ADMISSION:  12/22/2010 DATE OF DISCHARGE:  12/24/2010                              DISCHARGE SUMMARY   PRIMARY CARDIOLOGIST:  Verne Carrow, MD.  PRIMARY CARE PROVIDER:  Audrie Gallus. Tower, MD.  PULMONOLOGIST:  Leslye Peer, MD.  DISCHARGE DIAGNOSIS:  Unstable angina.  SECONDARY DIAGNOSES: 1. Coronary artery disease status post prior left anterior descending     and circumflex stenting with proximal circumflex drug-eluting stent     placement during this admission. 2. Ischemic cardiomyopathy, ejection fraction 40%. 3. Hypertension. 4. Hyperlipidemia. 5. Peripheral vascular disease/severe carotid arterial disease. 6. History of palpitations with PVCs and PACs, improved on beta-     blocker therapy. 7. Chronic obstructive pulmonary disease with wheezing, on carvedilol,     now switched to metoprolol. 8. History of prostate cancer, which has been treated.  ALLERGIES:  SULFA.  STATIN cause myalgias.  PROCEDURES:  Left heart cardiac catheterization performed, January 19, 2011, revealing patent stent in the mid LAD with 50% stenosis distal to the stent.  There is also a distal LAD stent which is patent.  A left circumflex with approximately 80% stenosis.  The stent in the mid and distal circumflex were widely patent.  Right coronary artery is nondominant with a mid 90% stenosis which is unchanged.  EF is 40%.  The proximal circumflex stent was 3.5 x 12-mm Promus Element Plus drug- eluting stent.  HISTORY OF PRESENT ILLNESS:  This is a 70 year old male with prior history of coronary artery disease status post prior LAD and circumflex stenting.  He also has a history of PACs and PVCs which are symptomatic and he has been treated with  beta-blocker.  The patient was in his usual state of health until the day of admission when he had 30 minutes of classic substernal chest discomfort, radiation to the jaw.  Symptoms resolved spontaneously.  He presents to the [facility] ED where his point-of-care markers were negative and ECG was nonacute.  He was admitted for evaluation.  HOSPITAL COURSE:  The patient ruled out for MI.  No recurrent of chest discomfort.  Given symptoms, decision was made to pursue catheterization.  Underwent diagnostic catheterization on October 3 revealing new 80% stenosis in the proximal left circumflex with patent stent in the remainder of the circumflex in the LAD.  EF is 40%. Proximal circumflex stent was 3.5 x 12-mm Promus Element Plus drug- eluting stent.  The patient tolerated this procedure well and postprocedure had been ambulating without recurrent symptoms or limitations.  He has been seen by Cardiac Rehab with outpatient referral made.  He will be discharged home in good condition.  DISCHARGE LABS:  Hemoglobin 14.0, hematocrit 41.4, WBC 8.0, platelets 204, INR 1.02.  Sodium 137, potassium 4.1, chloride 105, CO2 28, BUN 10, creatinine 0.2, glucose 95, total bilirubin 0.3, alkaline phosphatase 45, AST 16, ALT 13, total protein 7.2, albumin 4.3, calcium 8.9, CK 29, MB 1.9, troponin-I less than 0.30.  DISPOSITION:  The patient will be discharged home today in good condition.  FOLLOWUP PLANS AND APPOINTMENTS:  We have arranged the patient follow up with Dr. Clifton James on October 24 at 9:30 a.m.  He is to follow up with Dr. Milinda Antis as previously scheduled.  DISCHARGE MEDICATIONS: 1. Metoprolol 25 mg half tablet b.i.d. (Coreg discontinued secondary     to wheezing). 2. Nitroglycerin 0.4 mg p.r.n. chest pain. 3. Plavix 75 mg daily. 4. Alprazolam 0.5 mg half tablet daily p.r.n. 5. Aspirin 81 mg nightly. 6. DuoNeb q.6 hours p.r.n. 7. Spiriva 18 mcg daily. 8. Symbicort 160/4.5 mcg two puffs  b.i.d. 9. Flomax 0.4 mg every other day. 10.Ventolin inhaler two puffs q.4 hours.  OUTSTANDING LAB STUDIES:  None.  DURATION OF DISCHARGE ENCOUNTER:  35 minutes including physician time.     [name], ANP   ______________________________ Noralyn Pick. Eden Emms, MD, [facility]    CB/MEDQ  D:  12/24/2010  T:  12/24/2010  Job:  [id number]  cc:   [name], MD  Electronically Signed by [name] ANP on 12/30/2010 07:13:41 PM Electronically Signed by Charlton Haws MD [facility] on 01/05/2011 11:03:53 PM

## 2011-01-13 ENCOUNTER — Encounter: Payer: Self-pay | Admitting: Cardiovascular Disease

## 2011-01-13 ENCOUNTER — Ambulatory Visit (INDEPENDENT_AMBULATORY_CARE_PROVIDER_SITE_OTHER): Payer: Medicare Other | Admitting: Cardiovascular Disease

## 2011-01-13 VITALS — BP 127/67 | HR 81 | Ht 70.0 in | Wt 203.0 lb

## 2011-01-13 DIAGNOSIS — I2589 Other forms of chronic ischemic heart disease: Secondary | ICD-10-CM

## 2011-01-13 DIAGNOSIS — I255 Ischemic cardiomyopathy: Secondary | ICD-10-CM

## 2011-01-13 DIAGNOSIS — I2510 Atherosclerotic heart disease of native coronary artery without angina pectoris: Secondary | ICD-10-CM

## 2011-01-13 MED ORDER — LOSARTAN POTASSIUM 50 MG PO TABS: 50.0000 mg | ORAL_TABLET | Freq: Every day | ORAL | Status: DC

## 2011-01-13 NOTE — Patient Instructions (Signed)
Your physician wants you to follow-up in:6 months.  You will receive a reminder letter in the mail two months in advance. If you don't receive a letter, please call our office to schedule the follow-up appointment  Your physician has recommended you make the following change in your medication: Start Cozaar 50 mg by mouth daily  Have fasting lab work drawn at [facility] office--Lipid profile

## 2011-01-13 NOTE — Assessment & Plan Note (Signed)
Will continue beta blocker, add ARB.

## 2011-01-13 NOTE — Assessment & Plan Note (Addendum)
Continue current meds. Will add Cozaar 50 mg po Qdaily. Check lipids next week in [facility] office.

## 2011-01-13 NOTE — Progress Notes (Signed)
History of Present Illness:[name] is a 70 yo male, prior patient of [name], with a h/o CAD, s/p Ant MI in 1997 tx'd with stent x 2 to the LAD, bare metal stenting to the CFX in 2002 and PCI in 2009 with placement of 2 separate drug eluting stents in the LAD. He is followed by [name] for COPD. He called the office with chest pain and was seen by [name] on 04/02/10. Cardiac cath on 04/14/10 with severe stenosis in the distal AV groove Circumflex and the OM. These were both treated with DES. He was admitted to [facility] December 22, 2010 with chest pain. Cath with ulcerated plaque in the proximal Circumflex, now s/p drug eluting stent x1.   He is here for follow up. He has done well from a cardiac standpoint. He has changed his inhalers and he feels that his breathing is a little worse. No chest pain. He had been on Quinapril but it was stopped because of a cough.    Past Medical History  Diagnosis Date  . COPD (chronic obstructive pulmonary disease)   . CAD (coronary artery disease)   . HTN (hypertension)   . Hyperlipidemia   . Osteoarthritis   . Peripheral vascular disease   . Prostate cancer     Past Surgical History  Procedure Date  . Coronary stent placement 97, 2000, 2012    x2  . Doppler echocardiography 1995    neg  . Carotid endarterectomy 03-2000    right  . Colonoscopy 04-2001    polyp  . Cardiovascular stress test 12-2006    Current Outpatient Prescriptions  Medication Sig Dispense Refill  . albuterol (VENTOLIN HFA) 108 (90 BASE) MCG/ACT inhaler Inhale 2 puffs into the lungs every 4 (four) hours as needed for wheezing or shortness of breath.  18 g  6  . ALPRAZolam (XANAX) 0.5 MG tablet Take 1 tablet (0.5 mg total) by mouth at bedtime as needed for sleep or anxiety.  30 tablet  0  . aspirin 81 MG tablet Take 81 mg by mouth daily.        . clopidogrel (PLAVIX) 75 MG tablet Take 1 tablet (75 mg total) by mouth daily.  30 tablet  6  . Fluticasone-Salmeterol (ADVAIR)  500-50 MCG/DOSE AEPB Inhale 1 puff into the lungs every 12 (twelve) hours.        Marland Kitchen ibuprofen (ADVIL,MOTRIN) 200 MG tablet Take 200 mg by mouth every 6 (six) hours as needed.        Marland Kitchen ipratropium-albuterol (DUONEB) 0.5-2.5 (3) MG/3ML SOLN USE AS DIRECTED  270 mL  1  . loratadine (CLARITIN) 10 MG tablet Take 1 tablet (10 mg total) by mouth daily.  30 tablet  4  . metoprolol tartrate (LOPRESSOR) 25 MG tablet 1/2 tablet twice daily      . nitroGLYCERIN (NITROSTAT) 0.4 MG SL tablet Place 0.4 mg under the tongue every 5 (five) minutes as needed.        . Tamsulosin HCl (FLOMAX) 0.4 MG CAPS Take 0.4 mg by mouth every other day.        . tiotropium (SPIRIVA) 18 MCG inhalation capsule Place 18 mcg into inhaler and inhale daily.          Allergies  Allergen Reactions  . Sulfonamide Derivatives     History   Social History  . Marital Status: Married    Spouse Name: N/A    Number of Children: N/A  . Years of Education: N/A  Occupational History  . firefighter    Social History Main Topics  . Smoking status: Former Smoker -- 2.0 packs/day for 50 years    Types: Cigarettes    Quit date: 02/22/2010  . Smokeless tobacco: Not on file  . Alcohol Use: Not on file  . Drug Use: Not on file  . Sexually Active: Not on file   Other Topics Concern  . Not on file   Social History Narrative  . No narrative on file    Family History  Problem Relation Age of Onset  . Stroke Father   . Heart disease Mother     pacemaker  . Other Brother     benign brain tumor    Review of Systems:  As stated in the HPI and otherwise negative.   BP 127/67  Pulse 81  Ht 5\' 10"  (1.778 m)  Wt 203 lb (92.08 kg)  BMI 29.13 kg/m2  Physical Examination: General: Well developed, well nourished, NAD HEENT: OP clear, mucus membranes moist SKIN: warm, dry. No rashes. Neuro: No focal deficits Musculoskeletal: Muscle strength 5/5 all ext Psychiatric: Mood and affect normal Neck: No JVD, no carotid bruits, no  thyromegaly, no lymphadenopathy. Lungs:Clear bilaterally, no wheezes, rhonci, crackles Cardiovascular: Regular rate and rhythm. No murmurs, gallops or rubs. Abdomen:Soft. Bowel sounds present. Non-tender.  Extremities: No lower extremity edema. Pulses are 2 + in the bilateral DP/PT.  Cardiac Cath 12/23/10:  1. The left main coronary artery had no evidence of disease.   2. The left anterior descending is a large vessel that coursed to the       apex.  The proximal vessel had mild 30% plaque disease.  There is a       stented segment in the midportion of the vessel with mild       restenosis.  Just beyond the mid stented segment there is a tubular       50% stenosis which is unchanged in appearance from prior       catheterization.  The stent in the mid to distal portion of the       vessel is patent with no restenosis.   3. The circumflex artery had an ulcerated-appearing 80% proximal       stenosis.  In the mid vessel leading into the obtuse marginal       branch, there is a patent stent with no restenosis.  The distal       portion of the AV groove circumflex has a stent that is patent with       no restenosis.   4. The right coronary artery is a very small-caliber nondominant       vessel with a mid 90% stenosis and distal 70% stenosis.  This is       unchanged from prior catheterization.  This vessel is too small in       caliber for percutaneous intervention.   5. Left ventricular angiogram was performed in the RAO projection and       it showed ejection fraction of 40-45%.

## 2011-01-20 ENCOUNTER — Other Ambulatory Visit: Payer: Self-pay | Admitting: *Deleted

## 2011-01-20 NOTE — Telephone Encounter (Signed)
Ok to refill 

## 2011-01-21 ENCOUNTER — Other Ambulatory Visit: Payer: Medicare Other

## 2011-01-21 ENCOUNTER — Other Ambulatory Visit (INDEPENDENT_AMBULATORY_CARE_PROVIDER_SITE_OTHER): Payer: Medicare Other

## 2011-01-21 DIAGNOSIS — I2510 Atherosclerotic heart disease of native coronary artery without angina pectoris: Secondary | ICD-10-CM

## 2011-01-21 LAB — LIPID PANEL
HDL: 48.6 mg/dL (ref >39.00)
Triglycerides: 70 mg/dL (ref 0.0–149.0)

## 2011-01-21 MED ORDER — ALPRAZOLAM 0.5 MG PO TABS: 0.5000 mg | ORAL_TABLET | Freq: Every evening | ORAL | Status: DC | PRN

## 2011-01-21 NOTE — Telephone Encounter (Signed)
Px written for call in   

## 2011-01-21 NOTE — Telephone Encounter (Signed)
Rx called in as directed.   

## 2011-01-25 ENCOUNTER — Other Ambulatory Visit: Payer: Self-pay | Admitting: *Deleted

## 2011-01-25 DIAGNOSIS — E7800 Pure hypercholesterolemia, unspecified: Secondary | ICD-10-CM

## 2011-01-25 MED ORDER — ROSUVASTATIN CALCIUM 5 MG PO TABS: 5.0000 mg | ORAL_TABLET | Freq: Every day | ORAL | Status: DC

## 2011-01-27 ENCOUNTER — Telehealth: Payer: Self-pay | Admitting: Cardiovascular Disease

## 2011-01-27 NOTE — Telephone Encounter (Signed)
Chart reviewed. Pt was on simvastatin 20 mg but this was stopped at office visit with [name] in January 2010 due to myalgias.  Note from July 2010 indicates he was unable to tolerate Pravachol.  Notes following this indicate intolerance to statins.  Will review with Dr. Clifton James

## 2011-01-27 NOTE — Telephone Encounter (Signed)
I would like for him to be on Crestor 5 mg once daily. I have found 10 mg sample pills of Crestor. He can cut in half and take 5 mg per day. Thanks, chris

## 2011-01-27 NOTE — Telephone Encounter (Signed)
Spoke with pt. I left samples of Crestor 10 mg at front desk for him to pick up.  He is aware to take half daily. He will follow up with labs at [facility] in 12 weeks.

## 2011-01-27 NOTE — Telephone Encounter (Signed)
Pt cant afford crestor can he get another med please call

## 2011-01-27 NOTE — Telephone Encounter (Signed)
Spoke with pt who reports he is unable to afford Crestor.  He states he was unable to take a cholesterol medicine in past but does not remember the name of it.  Will review chart to try to determine med intolerance.

## 2011-01-28 ENCOUNTER — Telehealth: Payer: Self-pay | Admitting: Cardiovascular Disease

## 2011-01-28 NOTE — Telephone Encounter (Signed)
Pharmacy calling regarding pt Crestor. Pt c/o RX is to expensive and would like a substitute called in.

## 2011-01-28 NOTE — Telephone Encounter (Signed)
Spoke with pharmacy and let them know we have already addressed this with patient and he has been given samples of Crestor.

## 2011-03-08 ENCOUNTER — Encounter: Payer: Self-pay | Admitting: Emergency Medicine

## 2011-03-08 ENCOUNTER — Ambulatory Visit (INDEPENDENT_AMBULATORY_CARE_PROVIDER_SITE_OTHER): Payer: Medicare Other | Admitting: Emergency Medicine

## 2011-03-08 ENCOUNTER — Ambulatory Visit (INDEPENDENT_AMBULATORY_CARE_PROVIDER_SITE_OTHER)
Admission: RE | Admit: 2011-03-08 | Discharge: 2011-03-08 | Disposition: A | Discharge status: Home / Self Care | Payer: Medicare Other | Source: Ambulatory Visit | Attending: Emergency Medicine | Admitting: Emergency Medicine

## 2011-03-08 VITALS — BP 130/72 | HR 85 | Temp 98.4°F | Ht 69.0 in | Wt 201.2 lb

## 2011-03-08 DIAGNOSIS — J441 Chronic obstructive pulmonary disease with (acute) exacerbation: Secondary | ICD-10-CM

## 2011-03-08 MED ORDER — PREDNISONE 10 MG PO TABS: ORAL_TABLET | ORAL | Status: DC

## 2011-03-08 MED ORDER — DOXYCYCLINE HYCLATE 100 MG PO CAPS: 100.0000 mg | ORAL_CAPSULE | Freq: Two times a day (BID) | ORAL | Status: AC

## 2011-03-08 NOTE — Patient Instructions (Signed)
Continue your Spiriva and Advair CXR today Take prednisone and doxycycline prescriptions as directed.  Follow with Dr [name] in 1 month

## 2011-03-08 NOTE — Assessment & Plan Note (Signed)
AE in setting URI - pred + doxy - continue advair + spiriva - CXR today - rov 1 month

## 2011-03-08 NOTE — Progress Notes (Signed)
Subjective:    Patient ID: [name], male    DOB: 1940-04-28, 70 y.o.   MRN: [id number] HPI 70 yo smoker (~100pk-yrs), hx of CAD/PTCI, HTN,  prostate CA. Dx with COPD around 10 yrs ago. Last seen 2007 by PW. Maintanance was Advair bid. Previously on Spiriva, stopped in 2008.   ROV 70/22/12 -- COPD, CAD, quit smoking Dec 4, 11! Started to have URI symptoms about 1 week ago. Since then he has had increasd wheeze, SOB, no F/C, no CP, cough prod of greenish phlegm . Tells me that this past January he had CP, was cathed and required PTCI by Dr Clifton James.   ROV 07/24/10 -- COPD, FEV1 1.36 in 10/11. Has been doing fairly well, some difficulty w breathing in the hot months. Has been using ventolin about 2x a day depending on activity. Remains on Spiriva and Advair. Has not restarted smoking!! Quit x 5 months. Denies any trouble, No exacerbations, No hospitalizations. To see his urologist in June for PSA check.   ACUTE OV 08/20/10 Pt presents for an acute office visit. Complains of increased SOB, prod cough with green/brown mucus, right sided pain under ribs and toward back, weakness x1week, worse x2days . Mucinex is not helping. Cough is very aggravating. Mucus is thick and has turned green last few days. HE feels he is getting worse. Pain along right ribs with inspiration. No hemoptysis. >>tx w/ avelox and steroids taper  ACUTE OV 10/15/2010  Pt presents for an acute office visit. Complains of increased SOB, wheezing, occasional prod cough with green mucus x2weeks, worse x5days . Doing well until this last week. OTC not helping. Wheezing getting worse. Using SABA /nebs more often.  No ER or hospitalizations since last ov. This is his 3rd COPD flare this year. Neg fever or hemoptysis .   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 Has been seen several times 2012 for cough, wheeze, treated for probable AE-COPD. Presents today for regular f/u. He reports that he stopped his ACE-I, more because he  was feeling drained than to address cough. Over last 2 weeks more clear mucous that he is coughing up. His wheezing may also be slightly worse, he is using albuterol nebs more frequently. Has been SOB when laying supine. Maintenance Advair bid, Spiriva qd. He checks his o2 sats, they have not gone below 90%. Wants to hold off on daliresp due to cost.   ROV 12/29/10 -- Severe COPD, CAD (PTCI by Dr Clifton James). Walking oximetry last visit showed desats but he didn't want to start O2. Tells me that he was seen by Dr Clifton James last week, exertional SOB, CP, weakness. He underwent L cath with PTCI. He reports continued DOE, but CP has resolved, weakness is better. Last time we did experiment changing Advair to Symbicort - he believes that it is at least as good, not necessarily better. Still on Spiriva. He has not restarted quinipril. We treated him prednisone after his last visit here, he says it helped his stamina, may have made breathing better. Smoking 5-10 cig a day.   ROV 03/08/11 -- Severe COPD, documented hypoxemia but has not wanted O2. Had been well until developed URI about 2 weeks ago. Has had progressive SOB, wheeze, cough. Prod of green/brown, sometimes clear. He stopped smoking about 1 weeks ago.  Remains on Spiriva, went back to Advair.    Objective:  Physical Exam  Gen: Pleasant, obese man, in no distress,  normal affect  ENT: No lesions,  mouth clear,  oropharynx clear, no postnasal drip-- mild upper airway psuedowheeze on insp and exp  Neck: No JVD, no TMG, no carotid bruits  Lungs: No use of accessory muscles, no dullness to percussion, Coarse BS w/ few rhonchi, loud B exp wheezes all fields.   Cardiovascular: RRR, heart sounds normal, no murmur or gallops, no peripheral edema  Musculoskeletal: No deformities, no cyanosis or clubbing  Neuro: alert, non focal  Skin: Warm, no lesions or rashes   Assessment & Plan:  CHRONIC OBSTRUCTIVE PULMONARY DISEASE, ACUTE EXACERBATION AE in  setting URI - pred + doxy - continue advair + spiriva - CXR today - rov 1 month

## 2011-03-12 ENCOUNTER — Other Ambulatory Visit: Payer: Self-pay | Admitting: *Deleted

## 2011-03-12 MED ORDER — ALPRAZOLAM 0.5 MG PO TABS: 0.5000 mg | ORAL_TABLET | Freq: Every evening | ORAL | Status: DC | PRN

## 2011-03-12 NOTE — Telephone Encounter (Signed)
Medication phoned to [facility] [address] [facility] as instructed.  

## 2011-03-12 NOTE — Telephone Encounter (Signed)
Px written for call in   

## 2011-03-19 ENCOUNTER — Other Ambulatory Visit: Payer: Self-pay | Admitting: Family Medicine

## 2011-03-19 NOTE — Telephone Encounter (Signed)
[facility] request refill on Ventolin inhaler. Since it is prn med I spoke with pts wife and she said he has had some SOB today while at work and pt is running low on med. Pt's wife said pt had bronchitis and was seen by Dr [name], pulmonologist 03/08/11. Pts wife will ck with pharmacy later for refill. Please advise.

## 2011-03-19 NOTE — Telephone Encounter (Signed)
Will refill electronically  

## 2011-04-08 ENCOUNTER — Encounter: Payer: Self-pay | Admitting: Emergency Medicine

## 2011-04-08 ENCOUNTER — Ambulatory Visit (INDEPENDENT_AMBULATORY_CARE_PROVIDER_SITE_OTHER): Payer: Medicare Other | Admitting: Emergency Medicine

## 2011-04-08 DIAGNOSIS — J449 Chronic obstructive pulmonary disease, unspecified: Secondary | ICD-10-CM

## 2011-04-08 NOTE — Progress Notes (Signed)
  Subjective:    Patient ID: [name], male    DOB: 10-22-1940, 71 y.o.   MRN: [id number] HPI 71 yo smoker (~100pk-yrs), hx of CAD/PTCI, HTN,  prostate CA. Dx with COPD around 10 yrs ago. Last seen 2007 by PW. Maintanance was Advair bid. Previously on Spiriva, stopped in 2008.   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 Has been seen several times 2012 for cough, wheeze, treated for probable AE-COPD. Presents today for regular f/u. He reports that he stopped his ACE-I, more because he was feeling drained than to address cough. Over last 2 weeks more clear mucous that he is coughing up. His wheezing may also be slightly worse, he is using albuterol nebs more frequently. Has been SOB when laying supine. Maintenance Advair bid, Spiriva qd. He checks his o2 sats, they have not gone below 90%. Wants to hold off on daliresp due to cost.   ROV 12/29/10 -- Severe COPD, CAD (PTCI by Dr Clifton James). Walking oximetry last visit showed desats but he didn't want to start O2. Tells me that he was seen by Dr Clifton James last week, exertional SOB, CP, weakness. He underwent L cath with PTCI. He reports continued DOE, but CP has resolved, weakness is better. Last time we did experiment changing Advair to Symbicort - he believes that it is at least as good, not necessarily better. Still on Spiriva. He has not restarted quinipril. We treated him prednisone after his last visit here, he says it helped his stamina, may have made breathing better. Smoking 5-10 cig a day.   ROV 03/08/11 -- Severe COPD, documented hypoxemia but has not wanted O2. Had been well until developed URI about 2 weeks ago. Has had progressive SOB, wheeze, cough. Prod of green/brown, sometimes clear. He stopped smoking about 1 weeks ago.  Remains on Spiriva, went back to Advair.   ROV 04/08/11 -- Severe COPD, documented hypoxemia but has not wanted O2. Reports that he has restarted smoking - about 1/3 of a pack daily. Did not tolerate  Chantix. He is improved after rx with doxy and pred 1 months ago. Some cough, less freq - clear sputum. Remains on Spiriva and Advair 500/50.    Objective:  Physical Exam  Gen: Pleasant, obese man, in no distress,  normal affect  ENT: No lesions,  mouth clear,  oropharynx clear, no postnasal drip-- mild upper airway psuedowheeze on insp and exp  Neck: No JVD, no TMG, no carotid bruits  Lungs: No use of accessory muscles, no dullness to percussion, Coarse BS w/ few rhonchi, few scattered wheezes - much better.   Cardiovascular: RRR, heart sounds normal, no murmur or gallops, no peripheral edema  Musculoskeletal: No deformities, no cyanosis or clubbing  Neuro: alert, non focal  Skin: Warm, no lesions or rashes   Assessment & Plan:  COPD AE is improved. He is at baseline. Still smokes, still reticent to use O2.  - continue advair + spiriva - will revisit tobacco status and quitting in 3 months.

## 2011-04-08 NOTE — Patient Instructions (Signed)
Please continue your Spiriva and Advair.  Continue to work on stopping smoking.  Follow with Dr [name] in 3 months.

## 2011-04-08 NOTE — Assessment & Plan Note (Signed)
AE is improved. He is at baseline. Still smokes, still reticent to use O2.  - continue advair + spiriva - will revisit tobacco status and quitting in 3 months.

## 2011-04-10 ENCOUNTER — Other Ambulatory Visit: Payer: Self-pay | Admitting: Emergency Medicine

## 2011-04-12 ENCOUNTER — Telehealth: Payer: Self-pay | Admitting: Emergency Medicine

## 2011-04-12 MED ORDER — TIOTROPIUM BROMIDE MONOHYDRATE 18 MCG IN CAPS: 18.0000 ug | ORAL_CAPSULE | Freq: Every day | RESPIRATORY_TRACT | Status: DC

## 2011-04-12 NOTE — Telephone Encounter (Signed)
Spoke with pt's wife informed them that [facility] over [facility]

## 2011-04-18 ENCOUNTER — Other Ambulatory Visit: Payer: Self-pay | Admitting: Family Medicine

## 2011-04-19 NOTE — Telephone Encounter (Signed)
I did not see duoneb mentioned in last pulmonary note - please ask if he is still using this and on average how many tx per day or week thanks

## 2011-04-19 NOTE — Telephone Encounter (Signed)
Refill request for Duoneb. Pt last saw Dr Milinda Antis 01/26/10 but does appear has been seeing pulmonolgy.Please advise.

## 2011-04-22 ENCOUNTER — Other Ambulatory Visit (INDEPENDENT_AMBULATORY_CARE_PROVIDER_SITE_OTHER): Payer: Medicare Other

## 2011-04-22 DIAGNOSIS — E7800 Pure hypercholesterolemia, unspecified: Secondary | ICD-10-CM

## 2011-04-22 LAB — LIPID PANEL
Cholesterol: 143 mg/dL (ref 0–200)
HDL: 47.5 mg/dL (ref >39.00)
LDL Cholesterol: 83 mg/dL (ref 0–99)
Triglycerides: 64 mg/dL (ref 0.0–149.0)
VLDL: 12.8 mg/dL (ref 0.0–40.0)

## 2011-04-22 LAB — HEPATIC FUNCTION PANEL
ALT: 11 U/L (ref 0–53)
Albumin: 4 g/dL (ref 3.5–5.2)
Total Protein: 7.2 g/dL (ref 6.0–8.3)

## 2011-05-03 ENCOUNTER — Other Ambulatory Visit: Payer: Self-pay

## 2011-05-03 MED ORDER — ALPRAZOLAM 0.5 MG PO TABS: 0.5000 mg | ORAL_TABLET | Freq: Every evening | ORAL | Status: DC | PRN

## 2011-05-03 NOTE — Telephone Encounter (Signed)
[facility] request refill Alprazolam 0.5 mg. Last seen by Dr Milinda Antis 01/26/10 and no future scheduled appt with Dr Milinda Antis.Please advise.

## 2011-05-03 NOTE — Telephone Encounter (Signed)
Px written for call in  For 1 mo sched f/u please

## 2011-05-04 NOTE — Telephone Encounter (Signed)
Medication phoned to [facility] as instructed. Jamie please schedule f/u.

## 2011-05-04 NOTE — Telephone Encounter (Signed)
L/M for pt to call back and schedule a f/u in 1 month.

## 2011-05-05 ENCOUNTER — Other Ambulatory Visit: Payer: Self-pay | Admitting: Emergency Medicine

## 2011-05-06 ENCOUNTER — Encounter: Payer: Self-pay | Admitting: Family Medicine

## 2011-05-07 ENCOUNTER — Encounter: Payer: Self-pay | Admitting: Family Medicine

## 2011-05-07 ENCOUNTER — Ambulatory Visit (INDEPENDENT_AMBULATORY_CARE_PROVIDER_SITE_OTHER): Payer: Medicare Other | Admitting: Family Medicine

## 2011-05-07 VITALS — BP 125/65 | HR 84 | Temp 98.1°F | Ht 69.0 in | Wt 198.8 lb

## 2011-05-07 DIAGNOSIS — I10 Essential (primary) hypertension: Secondary | ICD-10-CM

## 2011-05-07 DIAGNOSIS — F4321 Adjustment disorder with depressed mood: Secondary | ICD-10-CM

## 2011-05-07 DIAGNOSIS — F17200 Nicotine dependence, unspecified, uncomplicated: Secondary | ICD-10-CM

## 2011-05-07 DIAGNOSIS — E785 Hyperlipidemia, unspecified: Secondary | ICD-10-CM

## 2011-05-07 NOTE — Patient Instructions (Addendum)
Blood pressure is stable  Cholesterol is good too  No change in medicines  Try to start exercise -- working up to 5 days a week (as your breathing will allow you) Really work hard on quitting smoking -this is your biggest health risk  If you are interested in a shingles/zoster vaccine - call your insurance to check on coverage,( you should not get it within 1 month of other vaccines) , then call us for a prescription  for it to take to a pharmacy that gives the shot or get it here depending on how it is covered  I'm glad you are doing well with grief -- but let us know in the future if you feel you want to talk to a counselor

## 2011-05-07 NOTE — Assessment & Plan Note (Signed)
Better on 2nd check today bp in fair control at this time  No changes needed  Disc lifstyle change with low sodium diet and exercise   

## 2011-05-07 NOTE — Assessment & Plan Note (Signed)
This appears to be very well controlled with crestor and low fat eating Disc goals for lipids and reasons to control them Rev labs with pt Rev low sat fat diet in detail

## 2011-05-07 NOTE — Progress Notes (Signed)
Subjective:    Patient ID: [name], male    DOB: [birth year], 71 y.o.   MRN: [id number]  HPI Here for f/u of chronic conditions lipids/ HTN /smoking   Has known cad and pvd and carotid dz   Lost his wife 11 days ago  Was somewhat unexpected  He is dealing with it fairly -- still is rough  Is taking xanax .5 on avg one per night -- (this is more than usual for him) Trying to cut back on them now  Is sleeping some  Not much appetite  Has very good support - which is helpful  Has not hit him yet  Does not want to see a counselor at this time - maybe later Now getting all the financial things settled  Has 6 children    Wt is down 5 lb with bmi of 29  bp is 140/60    Today No cp or palpitations or headaches or edema  occ has PACs and sees cardiologist 2/28 - for visit and carotid doppler  No side effects to medicines   On metoprolol and cozaar   Chemistry      Component Value Date/Time   NA 137 12/24/2010 0512   K 4.1 12/24/2010 0512   CL 105 12/24/2010 0512   CO2 28 12/24/2010 0512   BUN 10 12/24/2010 0512   CREATININE 0.82 12/24/2010 0512      Component Value Date/Time   CALCIUM 8.9 12/24/2010 0512   ALKPHOS 49 04/22/2011 0901   AST 15 04/22/2011 0901   ALT 11 04/22/2011 0901   BILITOT 0.4 04/22/2011 0901       Lipids were good on crestor in jan Lab Results  Component Value Date   CHOL 143 04/22/2011   HDL 47.50 04/22/2011   LDLCALC 83 04/22/2011   TRIG 64.0 04/22/2011   CHOLHDL 3 04/22/2011   diet- fair now  Avoids breads and fried foods   Smoking status smoking Had quit for about 3 months - and then started again several months ago  Stressors got him re started  Has nicotine patches to use if he needed   Was tx for prostate cancer - psa continues to drop  Copd is about the same Still sob on exertion and gets bronchitis 4 times per year Did get his flu shot   Never had a shingles shot - is interested in one if it is covered  Patient Active Problem List    Diagnoses  . PURE HYPERCHOLESTEROLEMIA  . HYPERLIPIDEMIA  . ANXIETY  . AMAUROSIS FUGAX  . HYPERTENSION, BENIGN  . MYOCARDIAL INFARCTION, HX OF  . CORONARY ARTERY DISEASE  . CORONARY ATHEROSCLEROSIS NATIVE CORONARY ARTERY  . PERIPHERAL VASCULAR DISEASE  . HEMORRHOIDS  . CHRONIC OBSTRUCTIVE PULMONARY DISEASE, ACUTE EXACERBATION  . EMPHYSEMA  . COPD  . OSTEOARTHRITIS  . PSA, INCREASED  . Smoker  . Occlusion and stenosis of carotid artery without mention of cerebral infarction  . CHEST PAIN  . Palpitations  . Ischemic cardiomyopathy  . Grief reaction   Past Medical History  Diagnosis Date  . COPD (chronic obstructive pulmonary disease)   . CAD (coronary artery disease)     Ant MI 44  . HTN (hypertension)   . Hyperlipidemia   . Osteoarthritis   . Peripheral vascular disease   . Prostate cancer    Past Surgical History  Procedure Date  . Coronary stent placement 97, 2000, 2012    x2  . Doppler echocardiography 1995  neg  . Carotid endarterectomy 03-2000    right  . Colonoscopy 04-2001    polyp  . Cardiovascular stress test 12-2006   History  Substance Use Topics  . Smoking status: Current Everyday Smoker -- 0.3 packs/day for 50 years    Types: Cigarettes    Last Attempt to Quit: 02/22/2010  . Smokeless tobacco: Not on file  . Alcohol Use: Not on file   Family History  Problem Relation Age of Onset  . Stroke Father   . Heart disease Mother     pacemaker  . Other Brother     benign brain tumor   Allergies  Allergen Reactions  . Sulfonamide Derivatives    Current Outpatient Prescriptions on File Prior to Visit  Medication Sig Dispense Refill  . ALPRAZolam (XANAX) 0.5 MG tablet Take 1 tablet (0.5 mg total) by mouth at bedtime as needed for sleep or anxiety.  30 tablet  0  . aspirin 81 MG tablet Take 81 mg by mouth daily.        . clopidogrel (PLAVIX) 75 MG tablet Take 1 tablet (75 mg total) by mouth daily.  30 tablet  6  . Fluticasone-Salmeterol  (ADVAIR) 500-50 MCG/DOSE AEPB Inhale 1 puff into the lungs every 12 (twelve) hours.        Marland Kitchen ipratropium-albuterol (DUONEB) 0.5-2.5 (3) MG/3ML SOLN Take 3 mLs by nebulization every 6 (six) hours as needed.       Marland Kitchen ipratropium-albuterol (DUONEB) 0.5-2.5 (3) MG/3ML SOLN USE AS DIRECTED  270 mL  1  . loratadine (CLARITIN) 10 MG tablet Take 1 tablet (10 mg total) by mouth daily.  30 tablet  4  . losartan (COZAAR) 50 MG tablet Take 1 tablet (50 mg total) by mouth daily.  30 tablet  11  . metoprolol tartrate (LOPRESSOR) 25 MG tablet 1/2 tablet twice daily      . nitroGLYCERIN (NITROSTAT) 0.4 MG SL tablet Place 0.4 mg under the tongue every 5 (five) minutes as needed.        . rosuvastatin (CRESTOR) 5 MG tablet Take 1 tablet (5 mg total) by mouth at bedtime.  30 tablet  11  . SPIRIVA HANDIHALER 18 MCG inhalation capsule INHALE 1 CAPSULE VIA HANDIHALER ONCE DAILY AT THE SAME TIME EVERY DAY  30 each  5  . Tamsulosin HCl (FLOMAX) 0.4 MG CAPS Take 0.4 mg by mouth daily.       . VENTOLIN HFA 108 (90 BASE) MCG/ACT inhaler INHALE 2 PUFFS INTO THE LUNGS EVERY 4 (FOUR) HOURS AS NEEDED FOR WHEEZING OR SHORTNESS OF BREATH.  1 Inhaler  11       Review of Systems Review of Systems  Constitutional: Negative for fever, appetite change, fatigue and unexpected weight change.  Eyes: Negative for pain and visual disturbance.  Respiratory: Negative for cough and pos for baseline sob and wheeze Cardiovascular: Negative for cp or palpitations    Gastrointestinal: Negative for nausea, diarrhea and constipation.  Genitourinary: Negative for urgency and frequency.  Skin: Negative for pallor or rash   Neurological: Negative for weakness, light-headedness, numbness and headaches.  Hematological: Negative for adenopathy. Does not bruise/bleed easily.  Psychiatric/Behavioral: pos for some mild depressive/anxious feelings and insomnia as part of grief rxn No SI          Objective:   Physical Exam  Constitutional: He  appears well-developed and well-nourished. No distress.       Somewhat frail appearing elderly male  HENT:  Head: Normocephalic and atraumatic.  Mouth/Throat: Oropharynx is clear and moist. No oropharyngeal exudate.  Eyes: Conjunctivae and EOM are normal. Pupils are equal, round, and reactive to light. No scleral icterus.  Neck: Normal range of motion. Neck supple. No JVD present. Carotid bruit is not present. No tracheal deviation present. No thyromegaly present.  Cardiovascular: Normal rate, regular rhythm and normal heart sounds.  Exam reveals no gallop.        Pedal pulses non palpable  Pulmonary/Chest: Effort normal. No respiratory distress. He has wheezes. He has no rales. He exhibits no tenderness.       Diffusely distant bs  Scant exp wheezes with mildly prolonged exp phase  Abdominal: Soft. Bowel sounds are normal. He exhibits no distension, no abdominal bruit and no mass. There is no tenderness.  Musculoskeletal: Normal range of motion. He exhibits no edema and no tenderness.  Lymphadenopathy:    He has no cervical adenopathy.  Neurological: He is alert. He has normal reflexes. No cranial nerve deficit. He exhibits normal muscle tone. Coordination normal.  Skin: Skin is warm and dry. No rash noted. No erythema. No pallor.  Psychiatric: His behavior is normal. Thought content normal.       Pt seems generally sad and mildly tearful at times when disc death of his wife  Good eye contact and comm skills           Assessment & Plan:

## 2011-05-07 NOTE — Assessment & Plan Note (Signed)
Pt lost wife 11 days ago- overall doing ok  Is taking xanax less - we did refil it  Disc need for ssri later if he needs it or counselor  He will think about that Has good support and fair coping skills

## 2011-05-07 NOTE — Assessment & Plan Note (Signed)
Disc in detail risks of smoking and possible outcomes including copd, vascular/ heart disease, cancer , respiratory and sinus infections  Pt voices understanding He is going to try to quit- even in midst of grief  Will be helpful to go back to work

## 2011-05-10 ENCOUNTER — Other Ambulatory Visit: Payer: Self-pay | Admitting: Cardiovascular Disease

## 2011-05-13 ENCOUNTER — Other Ambulatory Visit: Payer: Self-pay | Admitting: Cardiology

## 2011-05-13 ENCOUNTER — Ambulatory Visit: Payer: Medicare Other | Admitting: Cardiovascular Disease

## 2011-05-13 DIAGNOSIS — I6529 Occlusion and stenosis of unspecified carotid artery: Secondary | ICD-10-CM

## 2011-05-20 ENCOUNTER — Encounter (INDEPENDENT_AMBULATORY_CARE_PROVIDER_SITE_OTHER): Payer: Medicare Other

## 2011-05-20 ENCOUNTER — Encounter: Payer: Self-pay | Admitting: Cardiovascular Disease

## 2011-05-20 ENCOUNTER — Ambulatory Visit: Payer: Medicare Other | Admitting: Cardiovascular Disease

## 2011-05-20 ENCOUNTER — Ambulatory Visit (INDEPENDENT_AMBULATORY_CARE_PROVIDER_SITE_OTHER): Payer: Medicare Other | Admitting: Cardiovascular Disease

## 2011-05-20 VITALS — BP 121/64 | HR 72 | Ht 71.0 in | Wt 198.0 lb

## 2011-05-20 DIAGNOSIS — F17200 Nicotine dependence, unspecified, uncomplicated: Secondary | ICD-10-CM

## 2011-05-20 DIAGNOSIS — I6529 Occlusion and stenosis of unspecified carotid artery: Secondary | ICD-10-CM

## 2011-05-20 DIAGNOSIS — I2510 Atherosclerotic heart disease of native coronary artery without angina pectoris: Secondary | ICD-10-CM

## 2011-05-20 DIAGNOSIS — I779 Disorder of arteries and arterioles, unspecified: Secondary | ICD-10-CM

## 2011-05-20 NOTE — Assessment & Plan Note (Signed)
Stable. No changes. BP and lipids are well controlled.

## 2011-05-20 NOTE — Assessment & Plan Note (Signed)
LICA with moderate stenosis. RICA stable post CEA. Repeat dopplers in 6 months.

## 2011-05-20 NOTE — Assessment & Plan Note (Signed)
Smoking cessation encouraged!

## 2011-05-20 NOTE — Patient Instructions (Signed)
Your physician wants you to follow-up in:  6 months. You will receive a reminder letter in the mail two months in advance. If you don't receive a letter, please call our office to schedule the follow-up appointment.   

## 2011-05-20 NOTE — Progress Notes (Signed)
History of Present Illness: [name] is a 71 yo male, prior patient of [name], with a h/o CAD, s/p Ant MI in 1997 tx'd with stent x 2 to the LAD, bare metal stenting to the CFX in 2002 and PCI in 2009 with placement of 2 separate drug eluting stents in the LAD. He is followed by [name] for COPD. He called the office with chest pain and was seen by [name] on 04/02/10. Cardiac cath on 04/14/10 with severe stenosis in the distal AV groove Circumflex and the OM. These were both treated with DES.   He is here for follow up. He has done well from a cardiac standpoint. He has had no severe chest pains. Occasional skipped beats. His wife passed away last week. He found her dead in her sleep. She had a UTI. His carotid artery dopplers today show mild disease in RICA at CEA site and moderate disease in LICA (60-79%). He is smoking again, 1ppd.   [name]  Last Lipid Profile: January 2013, well controlled.   Past Medical History  Diagnosis Date  . COPD (chronic obstructive pulmonary disease)   . CAD (coronary artery disease)     Ant MI 55  . HTN (hypertension)   . Hyperlipidemia   . Osteoarthritis   . Peripheral vascular disease   . Prostate cancer     Past Surgical History  Procedure Date  . Coronary stent placement 97, 2000, 2012    x2  . Doppler echocardiography 1995    neg  . Carotid endarterectomy 03-2000    right  . Colonoscopy 04-2001    polyp  . Cardiovascular stress test 12-2006    Current Outpatient Prescriptions  Medication Sig Dispense Refill  . ALPRAZolam (XANAX) 0.5 MG tablet Take 1 tablet (0.5 mg total) by mouth at bedtime as needed for sleep or anxiety.  30 tablet  0  . aspirin 81 MG tablet Take 81 mg by mouth daily.        . clopidogrel (PLAVIX) 75 MG tablet TAKE 1 TABLET (75 MG TOTAL) BY MOUTH DAILY.  30 tablet  6  . Fluticasone-Salmeterol (ADVAIR) 500-50 MCG/DOSE AEPB Inhale 1 puff into the lungs every 12 (twelve) hours.         Marland Kitchen ipratropium-albuterol (DUONEB) 0.5-2.5 (3) MG/3ML SOLN Take 3 mLs by nebulization every 6 (six) hours as needed.       Marland Kitchen ipratropium-albuterol (DUONEB) 0.5-2.5 (3) MG/3ML SOLN USE AS DIRECTED  270 mL  1  . loratadine (CLARITIN) 10 MG tablet Take 1 tablet (10 mg total) by mouth daily.  30 tablet  4  . losartan (COZAAR) 50 MG tablet Take 1 tablet (50 mg total) by mouth daily.  30 tablet  11  . metoprolol tartrate (LOPRESSOR) 25 MG tablet 1/2 tablet twice daily      . nitroGLYCERIN (NITROSTAT) 0.4 MG SL tablet Place 0.4 mg under the tongue every 5 (five) minutes as needed.        . rosuvastatin (CRESTOR) 5 MG tablet Take 1 tablet (5 mg total) by mouth at bedtime.  30 tablet  11  . SPIRIVA HANDIHALER 18 MCG inhalation capsule INHALE 1 CAPSULE VIA HANDIHALER ONCE DAILY AT THE SAME TIME EVERY DAY  30 each  5  . Tamsulosin HCl (FLOMAX) 0.4 MG CAPS Take 0.4 mg by mouth daily.       . VENTOLIN HFA 108 (90 BASE) MCG/ACT inhaler INHALE 2 PUFFS INTO THE LUNGS EVERY  4 (FOUR) HOURS AS NEEDED FOR WHEEZING OR SHORTNESS OF BREATH.  1 Inhaler  11    Allergies  Allergen Reactions  . Sulfonamide Derivatives     History   Social History  . Marital Status: Married    Spouse Name: N/A    Number of Children: N/A  . Years of Education: N/A   Occupational History  . firefighter    Social History Main Topics  . Smoking status: Current Everyday Smoker -- 0.3 packs/day for 50 years    Types: Cigarettes    Last Attempt to Quit: 02/22/2010  . Smokeless tobacco: Not on file   Comment: pt started smoking wife just recently passed  . Alcohol Use: Not on file  . Drug Use: Not on file  . Sexually Active: Not on file   Other Topics Concern  . Not on file   Social History Narrative  . No narrative on file    Family History  Problem Relation Age of Onset  . Stroke Father   . Heart disease Mother     pacemaker  . Other Brother     benign brain tumor    Review of Systems:  As stated in the HPI  and otherwise negative.   BP 121/64  Pulse 72  Ht 5\' 11"  (1.803 m)  Wt 198 lb (89.812 kg)  BMI 27.62 kg/m2  Physical Examination: General: Well developed, well nourished, NAD HEENT: OP clear, mucus membranes moist SKIN: warm, dry. No rashes. Neuro: No focal deficits Musculoskeletal: Muscle strength 5/5 all ext Psychiatric: Mood and affect normal Neck: No JVD, no carotid bruits, no thyromegaly, no lymphadenopathy. Lungs:Clear bilaterally, no wheezes, rhonci, crackles Cardiovascular: Regular rate and rhythm. No murmurs, gallops or rubs. Abdomen:Soft. Bowel sounds present. Non-tender.  Extremities: No lower extremity edema. Pulses are 2 + in the bilateral DP/PT.  Carotid dopplers: see above

## 2011-05-26 ENCOUNTER — Other Ambulatory Visit: Payer: Self-pay | Admitting: *Deleted

## 2011-05-26 MED ORDER — FLUTICASONE-SALMETEROL 500-50 MCG/DOSE IN AEPB: 1.0000 | INHALATION_SPRAY | Freq: Two times a day (BID) | RESPIRATORY_TRACT | Status: DC

## 2011-06-01 ENCOUNTER — Other Ambulatory Visit: Payer: Self-pay | Admitting: Emergency Medicine

## 2011-07-13 ENCOUNTER — Ambulatory Visit: Payer: Medicare Other | Admitting: Emergency Medicine

## 2011-07-15 ENCOUNTER — Ambulatory Visit (INDEPENDENT_AMBULATORY_CARE_PROVIDER_SITE_OTHER): Payer: Medicare Other | Admitting: Emergency Medicine

## 2011-07-15 ENCOUNTER — Encounter: Payer: Self-pay | Admitting: Emergency Medicine

## 2011-07-15 VITALS — BP 122/60 | HR 80 | Temp 97.8°F | Ht 70.0 in | Wt 196.2 lb

## 2011-07-15 DIAGNOSIS — F17200 Nicotine dependence, unspecified, uncomplicated: Secondary | ICD-10-CM

## 2011-07-15 DIAGNOSIS — J449 Chronic obstructive pulmonary disease, unspecified: Secondary | ICD-10-CM

## 2011-07-15 NOTE — Assessment & Plan Note (Signed)
Continue same BD regimen 

## 2011-07-15 NOTE — Assessment & Plan Note (Signed)
Discussed cessation today, he definitely wants to quit and believes he can. Wellbutrin + nicotine patches may be reasonable plan. He will let me know quit date when he is ready. Death of his wife playing a role here.

## 2011-07-15 NOTE — Progress Notes (Signed)
Subjective:    Patient ID: [name], male    DOB: 08-26-1940, 71 y.o.   MRN: [id number] HPI 71 yo smoker (~100pk-yrs), hx of CAD/PTCI, HTN,  prostate CA. Dx with COPD around 10 yrs ago. Last seen 2007 by PW. Maintanance was Advair bid. Previously on Spiriva, stopped in 2008.   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 Has been seen several times 2012 for cough, wheeze, treated for probable AE-COPD. Presents today for regular f/u. He reports that he stopped his ACE-I, more because he was feeling drained than to address cough. Over last 2 weeks more clear mucous that he is coughing up. His wheezing may also be slightly worse, he is using albuterol nebs more frequently. Has been SOB when laying supine. Maintenance Advair bid, Spiriva qd. He checks his o2 sats, they have not gone below 90%. Wants to hold off on daliresp due to cost.   ROV 12/29/10 -- Severe COPD, CAD (PTCI by Dr Clifton James). Walking oximetry last visit showed desats but he didn't want to start O2. Tells me that he was seen by Dr Clifton James last week, exertional SOB, CP, weakness. He underwent L cath with PTCI. He reports continued DOE, but CP has resolved, weakness is better. Last time we did experiment changing Advair to Symbicort - he believes that it is at least as good, not necessarily better. Still on Spiriva. He has not restarted quinipril. We treated him prednisone after his last visit here, he says it helped his stamina, may have made breathing better. Smoking 5-10 cig a day.   ROV 03/08/11 -- Severe COPD, documented hypoxemia but has not wanted O2. Had been well until developed URI about 2 weeks ago. Has had progressive SOB, wheeze, cough. Prod of green/brown, sometimes clear. He stopped smoking about 1 weeks ago.  Remains on Spiriva, went back to Advair.   ROV 04/08/11 -- Severe COPD, documented hypoxemia but has not wanted O2. Reports that he has restarted smoking - about 1/3 of a pack daily. Did not tolerate  Chantix. He is improved after rx with doxy and pred 1 months ago. Some cough, less freq - clear sputum. Remains on Spiriva and Advair 500/50.   ROV 07/14/11 -- Severe COPD, documented hypoxemia but has not wanted O2. Remains on Spiriva and Advair 500/50. DuoNebs prn, hasn't needed since last visit. Has been using albuterol HFA about once a day. Still smoking. Unfortunately his wife died 2024-03-04quitting tobacco hasn't been possible yet. He wants to quit again, but not ready to set quit date. He is still working.    Objective:  Physical Exam  Gen: Pleasant, obese man, in no distress,  normal affect  ENT: No lesions,  mouth clear,  oropharynx clear, no postnasal drip-- mild upper airway psuedowheeze on insp and exp  Neck: No JVD, no TMG, no carotid bruits  Lungs: No use of accessory muscles, no dullness to percussion, Coarse BS w/ few rhonchi, few scattered wheezes - much better.   Cardiovascular: RRR, heart sounds normal, no murmur or gallops, no peripheral edema  Musculoskeletal: No deformities, no cyanosis or clubbing  Neuro: alert, non focal  Skin: Warm, no lesions or rashes   Assessment & Plan:  Smoker Discussed cessation today, he definitely wants to quit and believes he can. Wellbutrin + nicotine patches may be reasonable plan. He will let me know quit date when he is ready. Death of his wife playing a role here.   COPD Continue same BD  regimen

## 2011-07-15 NOTE — Patient Instructions (Signed)
Please continue your Advair and Spiriva as you are taking them Use albuterol 2 puffs as needed  We will revisit stopping smoking at your next visit. Continue to try cut down and call our office if we can help in any way Follow with Dr [name] in 3 months or sooner if you have any problems.

## 2011-07-21 ENCOUNTER — Other Ambulatory Visit: Payer: Self-pay | Admitting: *Deleted

## 2011-07-21 MED ORDER — ALPRAZOLAM 0.5 MG PO TABS: 0.5000 mg | ORAL_TABLET | Freq: Every evening | ORAL | Status: DC | PRN

## 2011-07-21 NOTE — Telephone Encounter (Signed)
Px written for call in   

## 2011-07-21 NOTE — Telephone Encounter (Signed)
Rx called to [facility].

## 2011-07-21 NOTE — Telephone Encounter (Signed)
Received faxed refill request from pharmacy. Is it okay to refill medication? 

## 2011-07-30 NOTE — Telephone Encounter (Signed)
Pt saw Dr Milinda Antis 05/07/11.

## 2011-08-20 ENCOUNTER — Telehealth: Payer: Self-pay | Admitting: *Deleted

## 2011-08-20 NOTE — Telephone Encounter (Signed)
Letter received in office from [name] at [facility] with request to see pt in the near future. I called pt to schedule appt. Left message to call back

## 2011-08-23 NOTE — Telephone Encounter (Signed)
Spoke with pt and appt made for pt to see Dr. Clifton James on August 31, 2011 at 9:00

## 2011-08-23 NOTE — Telephone Encounter (Signed)
Fu call Pt was returning your call from friday

## 2011-08-31 ENCOUNTER — Ambulatory Visit (INDEPENDENT_AMBULATORY_CARE_PROVIDER_SITE_OTHER): Payer: Medicare Other | Admitting: Cardiovascular Disease

## 2011-08-31 ENCOUNTER — Encounter: Payer: Self-pay | Admitting: Cardiovascular Disease

## 2011-08-31 VITALS — BP 122/66 | HR 80 | Ht 70.5 in | Wt 195.8 lb

## 2011-08-31 DIAGNOSIS — F17200 Nicotine dependence, unspecified, uncomplicated: Secondary | ICD-10-CM

## 2011-08-31 DIAGNOSIS — I2510 Atherosclerotic heart disease of native coronary artery without angina pectoris: Secondary | ICD-10-CM

## 2011-08-31 DIAGNOSIS — I779 Disorder of arteries and arterioles, unspecified: Secondary | ICD-10-CM

## 2011-08-31 NOTE — Assessment & Plan Note (Signed)
Stable. He is on good medical therapy. No signs or symptoms of unstable angina. BP is well controlled. Lipids are well controlled.  No changes today.

## 2011-08-31 NOTE — Assessment & Plan Note (Signed)
Smoking cessation encouraged!

## 2011-08-31 NOTE — Progress Notes (Signed)
History of Present Illness: [name] is a 71 yo male, prior patient of [name], with a h/o CAD, s/p Ant MI in 1997 tx'd with stent x 2 to the LAD, bare metal stenting to the CFX in 2002 and PCI in 2009 with placement of 2 separate drug eluting stents in the LAD. He is followed by [name] for COPD. He called the office with chest pain and was seen by [name] on 04/02/10. Cardiac cath on 04/14/10 with severe stenosis in the distal AV groove Circumflex and the OM. These were both treated with DES. His carotid artery dopplers 05/20/11 showed mild disease in RICA at CEA site and moderate disease in LICA (60-79%) with heavy plaque burden.   He is here for follow up. He has done well from a cardiac standpoint. He has had no chest pains. Occasional skipped beats but these are rare. His breathing is ok.  He is smoking 3/4 ppd. He has been seen recently by 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 The question has been raised if this could be embolic from his left ICA stenosis.  He tells me that his vision in the left eye is improving but still not normal. [name] was in 2002 with [name].   [name]   Last Lipid Profile: January 2013, well controlled.    Past Medical History  Diagnosis Date  . COPD (chronic obstructive pulmonary disease)   . CAD (coronary artery disease)     Ant MI 85  . HTN (hypertension)   . Hyperlipidemia   . Osteoarthritis   . Peripheral vascular disease   . Prostate cancer     Past Surgical History  Procedure Date  . Coronary stent placement 97, 2000, 2012    x2  . Doppler echocardiography 1995    neg  . Carotid endarterectomy 03-2000    right  . Colonoscopy 04-2001    polyp  . Cardiovascular stress test 12-2006    Current Outpatient Prescriptions  Medication Sig Dispense Refill  . ALPRAZolam (XANAX) 0.5 MG tablet Take 1 tablet (0.5 mg total) by mouth at  bedtime as needed for sleep or anxiety.  30 tablet  3  . aspirin 81 MG tablet Take 81 mg by mouth daily.        . clopidogrel (PLAVIX) 75 MG tablet TAKE 1 TABLET (75 MG TOTAL) BY MOUTH DAILY.  30 tablet  6  . Fluticasone-Salmeterol (ADVAIR) 500-50 MCG/DOSE AEPB Inhale 1 puff into the lungs every 12 (twelve) hours.  60 each  6  . ipratropium-albuterol (DUONEB) 0.5-2.5 (3) MG/3ML SOLN Take 3 mLs by nebulization every 6 (six) hours as needed.       . loratadine (CLARITIN) 10 MG tablet TAKE 1 TABLET EVERY DAY  30 tablet  5  . losartan (COZAAR) 50 MG tablet Take 1 tablet (50 mg total) by mouth daily.  30 tablet  11  . metoprolol tartrate (LOPRESSOR) 25 MG tablet 1/2 tablet twice daily      . nitroGLYCERIN (NITROSTAT) 0.4 MG SL tablet Place 0.4 mg under the tongue every 5 (five) minutes as needed.        . rosuvastatin (CRESTOR) 5 MG tablet Take 1 tablet (5 mg total) by mouth at bedtime.  30 tablet  11  . SPIRIVA HANDIHALER 18 MCG inhalation capsule INHALE 1 CAPSULE VIA HANDIHALER ONCE DAILY AT THE SAME TIME EVERY DAY  30 each  5  . Tamsulosin HCl (FLOMAX) 0.4 MG CAPS Take 0.4 mg by mouth daily.       . VENTOLIN HFA 108 (90 BASE) MCG/ACT inhaler INHALE 2 PUFFS INTO THE LUNGS EVERY 4 (FOUR) HOURS AS NEEDED FOR WHEEZING OR SHORTNESS OF BREATH.  1 Inhaler  11    Allergies  Allergen Reactions  . Sulfonamide Derivatives     History   Social History  . Marital Status: Married    Spouse Name: N/A    Number of Children: N/A  . Years of Education: N/A   Occupational History  . firefighter    Social History Main Topics  . Smoking status: Current Everyday Smoker -- 0.3 packs/day for 50 years    Types: Cigarettes    Last Attempt to Quit: 02/22/2010  . Smokeless tobacco: Not on file   Comment: pt started smoking wife just recently passed, 1/2 PPD  . Alcohol Use: Not on file  . Drug Use: Not on file  . Sexually Active: Not on file   Other Topics Concern  . Not on file   Social History  Narrative  . No narrative on file    Family History  Problem Relation Age of Onset  . Stroke Father   . Heart disease Mother     pacemaker  . Other Brother     benign brain tumor    Review of Systems:  As stated in the HPI and otherwise negative.   BP 122/66  Pulse 80  Ht 5' 10.5" (1.791 m)  Wt 195 lb 12.8 oz (88.814 kg)  BMI 27.70 kg/m2  Physical Examination: General: Well developed, well nourished, NAD HEENT: OP clear, mucus membranes moist SKIN: warm, dry. No rashes. Neuro: No focal deficits Musculoskeletal: Muscle strength 5/5 all ext Psychiatric: Mood and affect normal Neck: No JVD, I cannot hear a left carotid bruit, no right carotid bruit,  no thyromegaly, no lymphadenopathy. Lungs:Decreased air movement, prolonged expiratory phase. clear bilaterally, no wheezes, rhonci, crackles Cardiovascular: Regular rate and rhythm. No murmurs, gallops or rubs. Abdomen:Soft. Bowel sounds present. Non-tender.  Extremities: No lower extremity edema. Pulses are palpable in the bilateral DP/PT.

## 2011-08-31 NOTE — Assessment & Plan Note (Signed)
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 He has been seen by [name] with [facility]. [name] in 2002 was with [name] with [facility]. We will refer back to [name] for evaluation and likely repeat carotid dopplers (report from February study is in [facility] and images can be sent over if needed).

## 2011-08-31 NOTE — Patient Instructions (Signed)
Your physician wants you to follow-up in: 6 months. You will receive a reminder letter in the mail two months in advance. If you don't receive a letter, please call our office to schedule the follow-up appointment.  You have been referred to [name] at [facility].

## 2011-09-01 ENCOUNTER — Other Ambulatory Visit: Payer: Self-pay

## 2011-09-01 DIAGNOSIS — I6529 Occlusion and stenosis of unspecified carotid artery: Secondary | ICD-10-CM

## 2011-09-01 DIAGNOSIS — H349 Unspecified retinal vascular occlusion: Secondary | ICD-10-CM

## 2011-09-25 ENCOUNTER — Other Ambulatory Visit: Payer: Self-pay | Admitting: Cardiovascular Disease

## 2011-09-27 ENCOUNTER — Other Ambulatory Visit: Payer: Self-pay | Admitting: *Deleted

## 2011-09-27 MED ORDER — METOPROLOL TARTRATE 25 MG PO TABS: 12.5000 mg | ORAL_TABLET | Freq: Two times a day (BID) | ORAL | Status: DC

## 2011-10-05 ENCOUNTER — Encounter: Payer: Self-pay | Admitting: Gastroenterology

## 2011-10-05 ENCOUNTER — Encounter: Payer: Medicare Other | Admitting: Vascular Surgery

## 2011-10-05 ENCOUNTER — Other Ambulatory Visit: Payer: Medicare Other

## 2011-10-11 ENCOUNTER — Encounter: Payer: Self-pay | Admitting: Vascular Surgery

## 2011-10-12 ENCOUNTER — Encounter: Payer: Self-pay | Admitting: Vascular Surgery

## 2011-10-12 ENCOUNTER — Ambulatory Visit (INDEPENDENT_AMBULATORY_CARE_PROVIDER_SITE_OTHER): Payer: Medicare Other | Admitting: Vascular Surgery

## 2011-10-12 VITALS — BP 148/75 | HR 74 | Resp 20 | Ht 70.5 in | Wt 196.0 lb

## 2011-10-12 DIAGNOSIS — I6529 Occlusion and stenosis of unspecified carotid artery: Secondary | ICD-10-CM

## 2011-10-12 DIAGNOSIS — Z48812 Encounter for surgical aftercare following surgery on the circulatory system: Secondary | ICD-10-CM

## 2011-10-12 DIAGNOSIS — H349 Unspecified retinal vascular occlusion: Secondary | ICD-10-CM

## 2011-10-12 NOTE — Progress Notes (Signed)
The patient presents today for evaluation of severe ptosis of his left internal carotid artery. He is well known to me from a prior right carotid endarterectomy in 2002. His had no difficulty since that time. He reports in April he began having visual changes and was seen and felt to have potential embolus as the cause of this. He has undergone carotid duplex evaluation severe showing extensive calcification and moderate to severe stenosis of his internal carotid artery. He specifically denies any focal neurologic deficits other than his visual changes. Tissues significant for multiple prior coronary stent placement. He reports that he has a extensive dating back to 33. Most recent was one year ago when he was having chest pain.  Past Medical History  Diagnosis Date  . COPD (chronic obstructive pulmonary disease)   . CAD (coronary artery disease)     Ant MI 41  . HTN (hypertension)   . Hyperlipidemia   . Osteoarthritis   . Peripheral vascular disease   . Prostate cancer   . Carotid artery disease     Right CEA 2002 per [name]    History  Substance Use Topics  . Smoking status: Current Everyday Smoker -- 0.3 packs/day for 50 years    Types: Cigarettes    Last Attempt to Quit: 02/22/2010  . Smokeless tobacco: Never Used   Comment: pt started smoking wife just recently passed, 1/2 PPD  . Alcohol Use: Yes    Family History  Problem Relation Age of Onset  . Stroke Father   . Heart disease Mother     pacemaker  . Other Brother     benign brain tumor    Allergies  Allergen Reactions  . Sulfonamide Derivatives     Current outpatient prescriptions:ALPRAZolam (XANAX) 0.5 MG tablet, Take 1 tablet (0.5 mg total) by mouth at bedtime as needed for sleep or anxiety., Disp: 30 tablet, Rfl: 3;  aspirin 81 MG tablet, Take 81 mg by mouth daily.  , Disp: , Rfl: ;  clopidogrel (PLAVIX) 75 MG tablet, TAKE 1 TABLET (75 MG TOTAL) BY MOUTH DAILY., Disp: 30 tablet, Rfl: 6 Fluticasone-Salmeterol  (ADVAIR) 500-50 MCG/DOSE AEPB, Inhale 1 puff into the lungs every 12 (twelve) hours., Disp: 60 each, Rfl: 6;  ipratropium-albuterol (DUONEB) 0.5-2.5 (3) MG/3ML SOLN, Take 3 mLs by nebulization every 6 (six) hours as needed. , Disp: , Rfl: ;  loratadine (CLARITIN) 10 MG tablet, TAKE 1 TABLET EVERY DAY, Disp: 30 tablet, Rfl: 5;  losartan (COZAAR) 50 MG tablet, Take 1 tablet (50 mg total) by mouth daily., Disp: 30 tablet, Rfl: 11 metoprolol tartrate (LOPRESSOR) 25 MG tablet, Take 0.5 tablets (12.5 mg total) by mouth 2 (two) times daily. 1/2 tablet twice daily, Disp: 30 tablet, Rfl: 3;  nitroGLYCERIN (NITROSTAT) 0.4 MG SL tablet, Place 0.4 mg under the tongue every 5 (five) minutes as needed.  , Disp: , Rfl: ;  SPIRIVA HANDIHALER 18 MCG inhalation capsule, INHALE 1 CAPSULE VIA HANDIHALER ONCE DAILY AT THE SAME TIME EVERY DAY, Disp: 30 each, Rfl: 5 Tamsulosin HCl (FLOMAX) 0.4 MG CAPS, Take 0.4 mg by mouth daily. , Disp: , Rfl: ;  VENTOLIN HFA 108 (90 BASE) MCG/ACT inhaler, INHALE 2 PUFFS INTO THE LUNGS EVERY 4 (FOUR) HOURS AS NEEDED FOR WHEEZING OR SHORTNESS OF BREATH., Disp: 1 Inhaler, Rfl: 11;  rosuvastatin (CRESTOR) 5 MG tablet, Take 1 tablet (5 mg total) by mouth at bedtime., Disp: 30 tablet, Rfl: 11  BP 148/75  Pulse 74  Resp 20  Ht  5' 10.5" (1.791 m)  Wt 196 lb (88.905 kg)  BMI 27.73 kg/m2  Body mass index is 27.73 kg/(m^2).       Review of systems positive shortness of breath with exertion Her logically he does have occasional numbness in his index and middle finger on the left. Related to peripheral nerve review of systems otherwise negative.  Physical exam well-developed well-nourished white male appearing stated age in no acute distress Carotid arteries reveal well-healed right neck incision and a soft bruit on the left Radial pulses are 2+ bilaterally Heart regular rate and rhythm without murmur Chest clear bilaterally without wheezes Abdomen soft nontender no masses noted no aneurysm  palpable Skin without ulcers or rashes Neurologically he is grossly intact  I reviewed his outpatient carotid duplex and we repeated this on a limited basis to determine if he was a candidate for endarterectomy based on duplex alone. He does have a slightly high bifurcation but does have normal internal carotid artery above the stenosis on the left.  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 I have recommended endarterectomy based on this. We will assure from his cardiologist that he is no need for further cardiac evaluation if so we'll schedule him for endarterectomy at his convenience

## 2011-10-12 NOTE — Progress Notes (Signed)
Carotid duplex performed @ [facility] 10/12/2011

## 2011-10-13 ENCOUNTER — Other Ambulatory Visit: Payer: Self-pay | Admitting: *Deleted

## 2011-10-13 ENCOUNTER — Encounter: Payer: Self-pay | Admitting: Vascular Surgery

## 2011-10-13 ENCOUNTER — Telehealth: Payer: Self-pay | Admitting: Vascular Surgery

## 2011-10-13 DIAGNOSIS — Z01818 Encounter for other preprocedural examination: Secondary | ICD-10-CM

## 2011-10-13 DIAGNOSIS — I6529 Occlusion and stenosis of unspecified carotid artery: Secondary | ICD-10-CM

## 2011-10-13 NOTE — Telephone Encounter (Signed)
Per Judy's verbal instructions on 10/13/11 I scheduled the patient an appt w/ Dr.McAlhany @ [facility] [phone] on 11/03/11 at 9:30am. This is a pre-op cardiac clearance appt prior to his LCEA surgery scheduled on 11/24/11. I left the pt a voicemail message regarding this appt. awt

## 2011-10-13 NOTE — Progress Notes (Signed)
[name], He was doing well from a cardiac standpoint when I saw him in June. I do not think he needs any ischemic workup before his CEA. He is on ASA and Plavix but his last stent procedure was in January 2012 so both could be held for CEA if needed. Let me know if you need anything further for clearance. Thanks for seeing him. Thayer Ohm

## 2011-10-19 NOTE — Procedures (Unsigned)
CAROTID DUPLEX EXAM  INDICATION:  Carotid stenosis.  HISTORY: Diabetes:  No. Cardiac:  Stent, MI. Hypertension:  Yes. Smoking:  Currently. Previous Surgery:  Right carotid endarterectomy 2002. CV History:  Vision disturbance, note of retinal occlusion. Amaurosis Fugax No, Paresthesias No, Hemiparesis No                                      RIGHT             LEFT Brachial systolic pressure:         138               132 Brachial Doppler waveforms:         WNL               WNL Vertebral direction of flow:        Antegrade         Antegrade DUPLEX VELOCITIES (cm/sec) CCA peak systolic                   89                105 ECA peak systolic                   87                140 ICA peak systolic                   61                292 ICA end diastolic                   20                107 PLAQUE MORPHOLOGY:                  Heterogenous      Heterogenous PLAQUE AMOUNT:                      Mild              Severe PLAQUE LOCATION:                    CCA/ECA           CCA/ICA  IMPRESSION: 1. Right internal carotid artery is patent with history of     endarterectomy, no hyperplasia and minimal heterogenous plaque     present at the proximal patch. 2. Bilateral external carotid arteries appear patent. 3. Left internal carotid artery stenosis present in the 60-79% range     (high end of range), may be underestimated due to shadowing making     interrogation difficult. 4. Bilateral vertebral arteries are patent and antegrade.  ___________________________________________ Larina Earthly, M.D.  SH/MEDQ  D:  10/12/2011  T:  10/12/2011  Job:  [id number]

## 2011-10-20 ENCOUNTER — Ambulatory Visit: Payer: Medicare Other | Admitting: Emergency Medicine

## 2011-10-26 ENCOUNTER — Encounter: Payer: Self-pay | Admitting: Emergency Medicine

## 2011-10-26 ENCOUNTER — Ambulatory Visit (INDEPENDENT_AMBULATORY_CARE_PROVIDER_SITE_OTHER): Payer: Medicare Other | Admitting: Emergency Medicine

## 2011-10-26 VITALS — BP 140/80 | HR 83 | Temp 98.8°F | Ht 70.5 in | Wt 196.6 lb

## 2011-10-26 DIAGNOSIS — J449 Chronic obstructive pulmonary disease, unspecified: Secondary | ICD-10-CM

## 2011-10-26 DIAGNOSIS — F17200 Nicotine dependence, unspecified, uncomplicated: Secondary | ICD-10-CM

## 2011-10-26 NOTE — Assessment & Plan Note (Signed)
Severe but stable. Having cough, will follow closely to insure this isn';t beginning of an exacerbation - same inhaled meds - rov 4 mon - he will call if cough changes or if he develops additional sx

## 2011-10-26 NOTE — Patient Instructions (Addendum)
Please continue your Advair and Spiriva as you are taking them Follow with Dr [name] in 4 months or sooner if you have any problems.

## 2011-10-26 NOTE — Assessment & Plan Note (Signed)
Discussed cessation again today. Strategies given. He isn't ready to set quit date

## 2011-10-26 NOTE — Progress Notes (Signed)
  Subjective:  Patient ID: [name], male    DOB: 1940-09-12, 71 y.o.   MRN: [id number] HPI 71 yo smoker (~100pk-yrs), hx of CAD/PTCI, HTN,  prostate CA. Dx with COPD around 10 yrs ago. Last seen 2007 by PW. Maintanance was Advair bid. Previously on Spiriva, stopped in 2008.   ROV 12/29/10 -- Severe COPD, CAD (PTCI by Dr Clifton James). Walking oximetry last visit showed desats but he didn't want to start O2. Tells me that he was seen by Dr Clifton James last week, exertional SOB, CP, weakness. He underwent L cath with PTCI. He reports continued DOE, but CP has resolved, weakness is better. Last time we did experiment changing Advair to Symbicort - he believes that it is at least as good, not necessarily better. Still on Spiriva. He has not restarted quinipril. We treated him prednisone after his last visit here, he says it helped his stamina, may have made breathing better. Smoking 5-10 cig a day.   ROV 03/08/11 -- Severe COPD, documented hypoxemia but has not wanted O2. Had been well until developed URI about 2 weeks ago. Has had progressive SOB, wheeze, cough. Prod of green/brown, sometimes clear. He stopped smoking about 1 weeks ago.  Remains on Spiriva, went back to Advair.   ROV 04/08/11 -- Severe COPD, documented hypoxemia but has not wanted O2. Reports that he has restarted smoking - about 1/3 of a pack daily. Did not tolerate Chantix. He is improved after rx with doxy and pred 1 months ago. Some cough, less freq - clear sputum. Remains on Spiriva and Advair 500/50.   ROV 07/14/11 -- Severe COPD, documented hypoxemia but has not wanted O2. Remains on Spiriva and Advair 500/50. DuoNebs prn, hasn't needed since last visit. Has been using albuterol HFA about once a day. Still smoking. Unfortunately his wife died 2024-02-13quitting tobacco hasn't been possible yet. He wants to quit again, but not ready to set quit date. He is still working.   ROV 10/26/11 -- Hx CAD, Severe COPD, documented hypoxemia but has not  wanted O2. Remains on Spiriva and Advair 500/50. DuoNebs or albuterol prn. Still smoking 3/4 pk a day. Remains on Spiriva + Advair. No AE since last visit. Able to work part-time, has difficulty w yard work.  Planning for L carotid sgy with Dr [name]. Some increased cough last 3 -4 days, more mucous. No change in dyspnea or wheeze.    Objective:  Physical Exam Filed Vitals:   10/26/11 1609  BP: 140/80  Pulse: 83  Temp: 98.8 F (37.1 C)   Gen: Pleasant, obese man, in no distress,  normal affect  ENT: No lesions,  mouth clear,  oropharynx clear, no postnasal drip  Neck: No JVD, no TMG, no carotid bruits  Lungs: No use of accessory muscles, no dullness to percussion, Coarse BS w/ few rhonchi, few scattered wheezes  Cardiovascular: RRR, heart sounds normal, no murmur or gallops, no peripheral edema  Musculoskeletal: No deformities, no cyanosis or clubbing  Neuro: alert, non focal  Skin: Warm, no lesions or rashes   Assessment & Plan:  COPD Severe but stable. Having cough, will follow closely to insure this isn';t beginning of an exacerbation - same inhaled meds - rov 4 mon - he will call if cough changes or if he develops additional sx  Smoker Discussed cessation again today. Strategies given. He isn't ready to set quit date

## 2011-11-01 ENCOUNTER — Other Ambulatory Visit: Payer: Self-pay

## 2011-11-03 ENCOUNTER — Encounter: Payer: Self-pay | Admitting: Cardiovascular Disease

## 2011-11-03 ENCOUNTER — Ambulatory Visit (INDEPENDENT_AMBULATORY_CARE_PROVIDER_SITE_OTHER): Payer: Medicare Other | Admitting: Cardiovascular Disease

## 2011-11-03 VITALS — BP 110/63 | HR 90 | Ht 70.0 in | Wt 194.0 lb

## 2011-11-03 DIAGNOSIS — Z01810 Encounter for preprocedural cardiovascular examination: Secondary | ICD-10-CM | POA: Insufficient documentation

## 2011-11-03 DIAGNOSIS — I2510 Atherosclerotic heart disease of native coronary artery without angina pectoris: Secondary | ICD-10-CM

## 2011-11-03 NOTE — Progress Notes (Signed)
History of Present Illness: [name] is a 71 yo male, prior patient of [name], with a h/o CAD, s/p Ant MI in 1997 tx'd with stent x 2 to the LAD, bare metal stenting to the CFX in 2002 and PCI in 2009 with placement of 2 separate drug eluting stents in the LAD. He is followed by [name] for COPD. He called the office with chest pain and was seen by [name] on 04/02/10. Cardiac cath on 04/14/10 with severe stenosis in the distal AV groove Circumflex and the OM. These were both treated with DES. His carotid artery dopplers 05/20/11 showed mild disease in RICA at CEA site and moderate disease in LICA (60-79%) with heavy plaque burden.   He is here for follow up. He has done well from a cardiac standpoint. He has had no chest pains but has occasional palpitations.  His breathing is ok. He is smoking 3/4 ppd. He has been seen  by 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 The question has been raised if this could be embolic from his left ICA stenosis. He saw [name] in July 2013 and plans are in place for left CEA in September.   [name]   Last Lipid Profile:  Lipid Panel     Component Value Date/Time   CHOL 143 04/22/2011 0901   TRIG 64.0 04/22/2011 0901   HDL 47.50 04/22/2011 0901   CHOLHDL 3 04/22/2011 0901   VLDL 12.8 04/22/2011 0901   LDLCALC 83 04/22/2011 0901    Past Medical History  Diagnosis Date  . COPD (chronic obstructive pulmonary disease)   . CAD (coronary artery disease)     Ant MI 65  . HTN (hypertension)   . Hyperlipidemia   . Osteoarthritis   . Peripheral vascular disease   . Prostate cancer   . Carotid artery disease     Right CEA 2002 per [name]    Past Surgical History  Procedure Date  . Coronary stent placement 97, 2000, 2012    x2  . Doppler echocardiography 1995    neg  . Carotid endarterectomy 03-2000    right  . Colonoscopy 04-2001    polyp  .  Cardiovascular stress test 12-2006    Current Outpatient Prescriptions  Medication Sig Dispense Refill  . ALPRAZolam (XANAX) 0.5 MG tablet Take 1 tablet (0.5 mg total) by mouth at bedtime as needed for sleep or anxiety.  30 tablet  3  . aspirin 81 MG tablet Take 81 mg by mouth daily.        . clopidogrel (PLAVIX) 75 MG tablet TAKE 1 TABLET (75 MG TOTAL) BY MOUTH DAILY.  30 tablet  6  . Fluticasone-Salmeterol (ADVAIR) 500-50 MCG/DOSE AEPB Inhale 1 puff into the lungs every 12 (twelve) hours.  60 each  6  . ipratropium-albuterol (DUONEB) 0.5-2.5 (3) MG/3ML SOLN Take 3 mLs by nebulization every 6 (six) hours as needed.       . loratadine (CLARITIN) 10 MG tablet TAKE 1 TABLET EVERY DAY  30 tablet  5  . losartan (COZAAR) 50 MG tablet Take 1 tablet (50 mg total) by mouth daily.  30 tablet  11  . metoprolol tartrate (LOPRESSOR) 25 MG tablet Take 0.5 tablets (12.5 mg total) by mouth 2 (two) times daily. 1/2 tablet twice daily  30 tablet  3  . nitroGLYCERIN (NITROSTAT) 0.4 MG SL tablet Place 0.4 mg  under the tongue every 5 (five) minutes as needed.        . rosuvastatin (CRESTOR) 5 MG tablet Take 1 tablet (5 mg total) by mouth at bedtime.  30 tablet  11  . SPIRIVA HANDIHALER 18 MCG inhalation capsule INHALE 1 CAPSULE VIA HANDIHALER ONCE DAILY AT THE SAME TIME EVERY DAY  30 each  5  . Tamsulosin HCl (FLOMAX) 0.4 MG CAPS Take 0.4 mg by mouth daily.       . VENTOLIN HFA 108 (90 BASE) MCG/ACT inhaler INHALE 2 PUFFS INTO THE LUNGS EVERY 4 (FOUR) HOURS AS NEEDED FOR WHEEZING OR SHORTNESS OF BREATH.  1 Inhaler  11    Allergies  Allergen Reactions  . Sulfonamide Derivatives     History   Social History  . Marital Status: Widowed    Spouse Name: N/A    Number of Children: N/A  . Years of Education: N/A   Occupational History  . firefighter    Social History Main Topics  . Smoking status: Current Everyday Smoker -- 0.3 packs/day for 50 years    Types: Cigarettes  . Smokeless tobacco: Never Used     Comment: pt started smoking wife just recently passed, 1/2 PPD  . Alcohol Use: Yes  . Drug Use: No  . Sexually Active: Not on file   Other Topics Concern  . Not on file   Social History Narrative  . No narrative on file    Family History  Problem Relation Age of Onset  . Stroke Father   . Heart disease Mother     pacemaker  . Other Brother     benign brain tumor    Review of Systems:  As stated in the HPI and otherwise negative.   BP 110/63  Pulse 90  Ht 5\' 10"  (1.778 m)  Wt 194 lb (87.998 kg)  BMI 27.84 kg/m2  Physical Examination: General: Well developed, well nourished, NAD HEENT: OP clear, mucus membranes moist SKIN: warm, dry. No rashes. Neuro: No focal deficits Musculoskeletal: Muscle strength 5/5 all ext Psychiatric: Mood and affect normal Neck: No JVD, no carotid bruits, no thyromegaly, no lymphadenopathy. Lungs:Clear bilaterally, no wheezes, rhonci, crackles Cardiovascular: Regular rate and rhythm. No murmurs, gallops or rubs. Abdomen:Soft. Bowel sounds present. Non-tender.  Extremities: No lower extremity edema. Pulses are 2 + in the bilateral DP/PT.  EKG: NSR, PVC. LAFB.

## 2011-11-03 NOTE — Patient Instructions (Addendum)
Your physician wants you to follow-up in:  6 months. You will receive a reminder letter in the mail two months in advance. If you don't receive a letter, please call our office to schedule the follow-up appointment.   

## 2011-11-03 NOTE — Assessment & Plan Note (Addendum)
He is doing well from a cardiac perspective. NO changes in meds. He could safely hold Plavix if [name] feels necessary for surgery.

## 2011-11-10 ENCOUNTER — Encounter (HOSPITAL_COMMUNITY): Payer: Self-pay | Admitting: Respiratory Therapy

## 2011-11-17 ENCOUNTER — Encounter (HOSPITAL_COMMUNITY): Payer: Self-pay

## 2011-11-17 ENCOUNTER — Encounter (HOSPITAL_COMMUNITY)
Admission: RE | Admit: 2011-11-17 | Discharge: 2011-11-17 | Disposition: A | Discharge status: Home / Self Care | Payer: Medicare Other | Source: Ambulatory Visit | Attending: Vascular Surgery | Admitting: Vascular Surgery

## 2011-11-17 HISTORY — DX: Gastro-esophageal reflux disease without esophagitis: K21.9

## 2011-11-17 HISTORY — DX: Acute myocardial infarction, unspecified: I21.9

## 2011-11-17 LAB — SURGICAL PCR SCREEN: MRSA, PCR: NEGATIVE

## 2011-11-17 LAB — CBC
HCT: 47.5 % (ref 39.0–52.0)
MCHC: 34.5 g/dL (ref 30.0–36.0)
MCV: 97.9 fL (ref 78.0–100.0)
Platelets: 219 10*3/uL (ref 150–400)
RDW: 13 % (ref 11.5–15.5)

## 2011-11-17 LAB — URINALYSIS, ROUTINE W REFLEX MICROSCOPIC
Leukocytes, UA: NEGATIVE
Nitrite: NEGATIVE
Protein, ur: NEGATIVE mg/dL
Urobilinogen, UA: 0.2 mg/dL (ref 0.0–1.0)

## 2011-11-17 LAB — COMPREHENSIVE METABOLIC PANEL
AST: 15 U/L (ref 0–37)
Albumin: 4.2 g/dL (ref 3.5–5.2)
BUN: 15 mg/dL (ref 6–23)
Creatinine, Ser: 0.9 mg/dL (ref 0.50–1.35)
Total Protein: 7.4 g/dL (ref 6.0–8.3)

## 2011-11-17 LAB — APTT: aPTT: 30 seconds (ref 24–37)

## 2011-11-17 LAB — TYPE AND SCREEN
ABO/RH(D): A POS
Antibody Screen: NEGATIVE

## 2011-11-17 LAB — PROTIME-INR: INR: 1.02 (ref 0.00–1.49)

## 2011-11-17 NOTE — Pre-Procedure Instructions (Addendum)
20 JADARRIUS MASELLI  11/17/2011   Your procedure is scheduled on:  11/24/11  Report to [facility] at 630 AM.  Call this number if you have problems the morning of surgery: [phone]   Remember:   Do not eat food or drink:After Midnight.  .  Take these medicines the morning of surgery with A SIP OF WATER: xanax, advair, spiriva,metoprolol,  STOP plavix,    Do not wear jewelry, make-up or nail polish.  Do not wear lotions, powders, or perfumes. You may wear deodorant.  Do not shave 48 hours prior to surgery. Men may shave face and neck.  Do not bring valuables to the hospital.  Contacts, dentures or bridgework may not be worn into surgery.  Leave suitcase in the car. After surgery it may be brought to your room.  For patients admitted to the hospital, checkout time is 11:00 AM the day of discharge.   Patients discharged the day of surgery will not be allowed to drive home.  Name and phone number of your driver: leslie dtr [phone]  Special Instructions: CHG Shower Use Special Wash: 1/2 bottle night before surgery and 1/2 bottle morning of surgery.   Please read over the following fact sheets that you were given: Pain Booklet, Coughing and Deep Breathing, Blood Transfusion Information, MRSA Information and Surgical Site Infection Prevention

## 2011-11-17 NOTE — Progress Notes (Signed)
Note from dr Sanjuana Kava 8/13, ekg 8/13, cxr 12/12, stress1/12 , cath 6/09

## 2011-11-23 MED ORDER — DEXTROSE 5 % IV SOLN
1.5000 g | INTRAVENOUS | Status: AC
  Administered 2011-11-24: 1.5 g via INTRAVENOUS
  Filled 2011-11-23: qty 1.5

## 2011-11-24 ENCOUNTER — Inpatient Hospital Stay (HOSPITAL_COMMUNITY)
Admission: RE | Admit: 2011-11-24 | Discharge: 2011-11-27 | DRG: 037 | Disposition: A | Discharge status: Home / Self Care | Payer: Medicare Other | Source: Ambulatory Visit | Attending: Vascular Surgery | Admitting: Vascular Surgery

## 2011-11-24 ENCOUNTER — Inpatient Hospital Stay (HOSPITAL_COMMUNITY): Payer: Medicare Other | Admitting: Anesthesiology

## 2011-11-24 ENCOUNTER — Encounter (HOSPITAL_COMMUNITY)
Admission: RE | Disposition: A | Discharge status: Home / Self Care | Payer: Self-pay | Source: Ambulatory Visit | Attending: Vascular Surgery

## 2011-11-24 ENCOUNTER — Encounter (HOSPITAL_COMMUNITY): Payer: Self-pay | Admitting: Anesthesiology

## 2011-11-24 DIAGNOSIS — Z79899 Other long term (current) drug therapy: Secondary | ICD-10-CM

## 2011-11-24 DIAGNOSIS — J449 Chronic obstructive pulmonary disease, unspecified: Secondary | ICD-10-CM | POA: Diagnosis present

## 2011-11-24 DIAGNOSIS — I639 Cerebral infarction, unspecified: Secondary | ICD-10-CM

## 2011-11-24 DIAGNOSIS — M1990 Unspecified osteoarthritis, unspecified site: Secondary | ICD-10-CM | POA: Diagnosis present

## 2011-11-24 DIAGNOSIS — I252 Old myocardial infarction: Secondary | ICD-10-CM

## 2011-11-24 DIAGNOSIS — I2510 Atherosclerotic heart disease of native coronary artery without angina pectoris: Secondary | ICD-10-CM

## 2011-11-24 DIAGNOSIS — Z01812 Encounter for preprocedural laboratory examination: Secondary | ICD-10-CM

## 2011-11-24 DIAGNOSIS — I6529 Occlusion and stenosis of unspecified carotid artery: Secondary | ICD-10-CM

## 2011-11-24 DIAGNOSIS — E7800 Pure hypercholesterolemia, unspecified: Secondary | ICD-10-CM

## 2011-11-24 DIAGNOSIS — F411 Generalized anxiety disorder: Secondary | ICD-10-CM | POA: Diagnosis present

## 2011-11-24 DIAGNOSIS — Z7902 Long term (current) use of antithrombotics/antiplatelets: Secondary | ICD-10-CM

## 2011-11-24 DIAGNOSIS — Z9861 Coronary angioplasty status: Secondary | ICD-10-CM

## 2011-11-24 DIAGNOSIS — I255 Ischemic cardiomyopathy: Secondary | ICD-10-CM

## 2011-11-24 DIAGNOSIS — E785 Hyperlipidemia, unspecified: Secondary | ICD-10-CM | POA: Diagnosis present

## 2011-11-24 DIAGNOSIS — I739 Peripheral vascular disease, unspecified: Secondary | ICD-10-CM | POA: Diagnosis present

## 2011-11-24 DIAGNOSIS — I10 Essential (primary) hypertension: Secondary | ICD-10-CM | POA: Diagnosis present

## 2011-11-24 DIAGNOSIS — I6340 Cerebral infarction due to embolism of unspecified cerebral artery: Secondary | ICD-10-CM | POA: Diagnosis not present

## 2011-11-24 DIAGNOSIS — F17200 Nicotine dependence, unspecified, uncomplicated: Secondary | ICD-10-CM | POA: Diagnosis present

## 2011-11-24 DIAGNOSIS — J4489 Other specified chronic obstructive pulmonary disease: Secondary | ICD-10-CM | POA: Diagnosis present

## 2011-11-24 DIAGNOSIS — Z8546 Personal history of malignant neoplasm of prostate: Secondary | ICD-10-CM

## 2011-11-24 DIAGNOSIS — K219 Gastro-esophageal reflux disease without esophagitis: Secondary | ICD-10-CM | POA: Diagnosis present

## 2011-11-24 DIAGNOSIS — Z7982 Long term (current) use of aspirin: Secondary | ICD-10-CM

## 2011-11-24 HISTORY — DX: Cerebral infarction, unspecified: I63.9

## 2011-11-24 HISTORY — PX: CAROTID ENDARTERECTOMY: SUR193

## 2011-11-24 HISTORY — PX: ENDARTERECTOMY: SHX5162

## 2011-11-24 LAB — PROTIME-INR: INR: 1 (ref 0.00–1.49)

## 2011-11-24 SURGERY — ENDARTERECTOMY, CAROTID
Anesthesia: General | Site: Neck | Laterality: Left | Wound class: Clean

## 2011-11-24 MED ORDER — PHENYLEPHRINE HCL 10 MG/ML IJ SOLN
10.0000 mg | INTRAVENOUS | Status: DC | PRN
  Administered 2011-11-24: 25 ug/min via INTRAVENOUS

## 2011-11-24 MED ORDER — TAMSULOSIN HCL 0.4 MG PO CAPS
0.4000 mg | ORAL_CAPSULE | Freq: Every day | ORAL | Status: DC
  Administered 2011-11-24 – 2011-11-26 (×3): 0.4 mg via ORAL
  Filled 2011-11-24 (×4): qty 1

## 2011-11-24 MED ORDER — SODIUM CHLORIDE 0.9 % IR SOLN
Status: DC | PRN
  Administered 2011-11-24: 08:00:00

## 2011-11-24 MED ORDER — HEPARIN SODIUM (PORCINE) 1000 UNIT/ML IJ SOLN
INTRAMUSCULAR | Status: DC | PRN
Start: 2011-11-24 — End: 2011-11-24
  Administered 2011-11-24: 9000 [IU] via INTRAVENOUS

## 2011-11-24 MED ORDER — LIDOCAINE HCL (CARDIAC) 20 MG/ML IV SOLN
INTRAVENOUS | Status: DC | PRN
  Administered 2011-11-24: 80 mg via INTRAVENOUS

## 2011-11-24 MED ORDER — POTASSIUM CHLORIDE CRYS ER 20 MEQ PO TBCR: 20.0000 meq | EXTENDED_RELEASE_TABLET | Freq: Once | ORAL | Status: AC | PRN

## 2011-11-24 MED ORDER — PROPOFOL 10 MG/ML IV EMUL
INTRAVENOUS | Status: DC | PRN
  Administered 2011-11-24: 150 mg via INTRAVENOUS

## 2011-11-24 MED ORDER — NITROGLYCERIN 0.4 MG SL SUBL: 0.4000 mg | SUBLINGUAL_TABLET | SUBLINGUAL | Status: DC | PRN

## 2011-11-24 MED ORDER — HYDROMORPHONE HCL PF 1 MG/ML IJ SOLN
INTRAMUSCULAR | Status: AC
  Filled 2011-11-24: qty 1

## 2011-11-24 MED ORDER — ATORVASTATIN CALCIUM 10 MG PO TABS
10.0000 mg | ORAL_TABLET | Freq: Every day | ORAL | Status: DC
  Administered 2011-11-24 – 2011-11-26 (×3): 10 mg via ORAL
  Filled 2011-11-24 (×4): qty 1

## 2011-11-24 MED ORDER — SODIUM CHLORIDE 0.9 % IV SOLN
INTRAVENOUS | Status: DC
  Administered 2011-11-24: 1000 mL via INTRAVENOUS

## 2011-11-24 MED ORDER — ONDANSETRON HCL 4 MG/2ML IJ SOLN
INTRAMUSCULAR | Status: DC | PRN
  Administered 2011-11-24: 4 mg via INTRAVENOUS

## 2011-11-24 MED ORDER — DEXTROSE 5 % IV SOLN
1.5000 g | Freq: Two times a day (BID) | INTRAVENOUS | Status: AC
  Administered 2011-11-24 – 2011-11-25 (×2): 1.5 g via INTRAVENOUS
  Filled 2011-11-24 (×2): qty 1.5

## 2011-11-24 MED ORDER — SODIUM CHLORIDE 0.9 % IV SOLN
500.0000 mL | Freq: Once | INTRAVENOUS | Status: AC | PRN
  Administered 2011-11-24: 500 mL via INTRAVENOUS

## 2011-11-24 MED ORDER — PHENOL 1.4 % MT LIQD
1.0000 | OROMUCOSAL | Status: DC | PRN
  Filled 2011-11-24: qty 177

## 2011-11-24 MED ORDER — METOPROLOL TARTRATE 12.5 MG HALF TABLET
12.5000 mg | ORAL_TABLET | Freq: Two times a day (BID) | ORAL | Status: DC
  Administered 2011-11-25 – 2011-11-27 (×4): 12.5 mg via ORAL
  Filled 2011-11-24 (×7): qty 1

## 2011-11-24 MED ORDER — FLUTICASONE-SALMETEROL 500-50 MCG/DOSE IN AEPB
1.0000 | INHALATION_SPRAY | Freq: Two times a day (BID) | RESPIRATORY_TRACT | Status: DC
Start: 2011-11-24 — End: 2011-11-27
  Administered 2011-11-24 – 2011-11-27 (×6): 1 via RESPIRATORY_TRACT
  Filled 2011-11-24: qty 14

## 2011-11-24 MED ORDER — ALUM & MAG HYDROXIDE-SIMETH 200-200-20 MG/5ML PO SUSP: 15.0000 mL | ORAL | Status: DC | PRN

## 2011-11-24 MED ORDER — LACTATED RINGERS IV SOLN
INTRAVENOUS | Status: DC | PRN
  Administered 2011-11-24 (×2): via INTRAVENOUS

## 2011-11-24 MED ORDER — HYDROMORPHONE HCL PF 1 MG/ML IJ SOLN
0.2500 mg | INTRAMUSCULAR | Status: DC | PRN
  Administered 2011-11-24: 0.5 mg via INTRAVENOUS
  Administered 2011-11-24: 0.25 mg via INTRAVENOUS
  Administered 2011-11-24 (×3): 0.5 mg via INTRAVENOUS

## 2011-11-24 MED ORDER — PROTAMINE SULFATE 10 MG/ML IV SOLN
INTRAVENOUS | Status: DC | PRN
  Administered 2011-11-24: 50 mg via INTRAVENOUS

## 2011-11-24 MED ORDER — GUAIFENESIN-DM 100-10 MG/5ML PO SYRP: 15.0000 mL | ORAL_SOLUTION | ORAL | Status: DC | PRN

## 2011-11-24 MED ORDER — ROCURONIUM BROMIDE 100 MG/10ML IV SOLN
INTRAVENOUS | Status: DC | PRN
  Administered 2011-11-24: 50 mg via INTRAVENOUS

## 2011-11-24 MED ORDER — OXYCODONE-ACETAMINOPHEN 5-325 MG PO TABS: 1.0000 | ORAL_TABLET | ORAL | Status: DC | PRN

## 2011-11-24 MED ORDER — ACETAMINOPHEN 325 MG PO TABS
325.0000 mg | ORAL_TABLET | ORAL | Status: DC | PRN
  Administered 2011-11-26: 650 mg via ORAL
  Filled 2011-11-24: qty 2

## 2011-11-24 MED ORDER — ACETAMINOPHEN 650 MG RE SUPP: 325.0000 mg | RECTAL | Status: DC | PRN

## 2011-11-24 MED ORDER — LOSARTAN POTASSIUM 50 MG PO TABS
50.0000 mg | ORAL_TABLET | Freq: Every day | ORAL | Status: DC
  Administered 2011-11-25 – 2011-11-27 (×3): 50 mg via ORAL
  Filled 2011-11-24 (×3): qty 1

## 2011-11-24 MED ORDER — NEOSTIGMINE METHYLSULFATE 1 MG/ML IJ SOLN
INTRAMUSCULAR | Status: DC | PRN
  Administered 2011-11-24: 4 mg via INTRAVENOUS

## 2011-11-24 MED ORDER — LIDOCAINE HCL (PF) 1 % IJ SOLN
INTRAMUSCULAR | Status: AC
  Filled 2011-11-24: qty 30

## 2011-11-24 MED ORDER — VECURONIUM BROMIDE 10 MG IV SOLR
INTRAVENOUS | Status: DC | PRN
  Administered 2011-11-24: 1 mg via INTRAVENOUS
  Administered 2011-11-24: 2 mg via INTRAVENOUS
  Administered 2011-11-24: 1 mg via INTRAVENOUS

## 2011-11-24 MED ORDER — ONDANSETRON HCL 4 MG/2ML IJ SOLN: 4.0000 mg | Freq: Four times a day (QID) | INTRAMUSCULAR | Status: DC | PRN

## 2011-11-24 MED ORDER — DOPAMINE-DEXTROSE 3.2-5 MG/ML-% IV SOLN: 3.0000 ug/kg/min | INTRAVENOUS | Status: DC | PRN

## 2011-11-24 MED ORDER — 0.9 % SODIUM CHLORIDE (POUR BTL) OPTIME
TOPICAL | Status: DC | PRN
  Administered 2011-11-24: 2000 mL

## 2011-11-24 MED ORDER — GLYCOPYRROLATE 0.2 MG/ML IJ SOLN
INTRAMUSCULAR | Status: DC | PRN
  Administered 2011-11-24: .6 mg via INTRAVENOUS

## 2011-11-24 MED ORDER — ASPIRIN EC 325 MG PO TBEC
325.0000 mg | DELAYED_RELEASE_TABLET | Freq: Every day | ORAL | Status: DC
  Administered 2011-11-25 – 2011-11-27 (×2): 325 mg via ORAL
  Filled 2011-11-24 (×2): qty 1

## 2011-11-24 MED ORDER — MORPHINE SULFATE 2 MG/ML IJ SOLN
2.0000 mg | INTRAMUSCULAR | Status: DC | PRN
Start: 2011-11-24 — End: 2011-11-27
  Administered 2011-11-24 (×2): 2 mg via INTRAVENOUS
  Filled 2011-11-24 (×2): qty 1

## 2011-11-24 MED ORDER — ALBUTEROL SULFATE HFA 108 (90 BASE) MCG/ACT IN AERS
2.0000 | INHALATION_SPRAY | Freq: Four times a day (QID) | RESPIRATORY_TRACT | Status: DC | PRN
  Administered 2011-11-24: 2 via RESPIRATORY_TRACT
  Filled 2011-11-24: qty 6.7

## 2011-11-24 MED ORDER — ASPIRIN 81 MG PO TABS: 81.0000 mg | ORAL_TABLET | Freq: Every day | ORAL | Status: DC

## 2011-11-24 MED ORDER — CLOPIDOGREL BISULFATE 75 MG PO TABS
75.0000 mg | ORAL_TABLET | Freq: Every day | ORAL | Status: DC
  Administered 2011-11-25 – 2011-11-27 (×3): 75 mg via ORAL
  Filled 2011-11-24 (×3): qty 1

## 2011-11-24 MED ORDER — LABETALOL HCL 5 MG/ML IV SOLN: 10.0000 mg | INTRAVENOUS | Status: DC | PRN

## 2011-11-24 MED ORDER — ALPRAZOLAM 0.5 MG PO TABS
0.5000 mg | ORAL_TABLET | Freq: Every evening | ORAL | Status: DC | PRN
  Administered 2011-11-25 – 2011-11-26 (×2): 0.5 mg via ORAL
  Filled 2011-11-24 (×2): qty 1

## 2011-11-24 MED ORDER — SODIUM CHLORIDE 0.9 % IV SOLN
INTRAVENOUS | Status: DC
  Administered 2011-11-24: 100 mL/h via INTRAVENOUS
  Administered 2011-11-24: via INTRAVENOUS

## 2011-11-24 MED ORDER — DOCUSATE SODIUM 100 MG PO CAPS
100.0000 mg | ORAL_CAPSULE | Freq: Every day | ORAL | Status: DC
  Administered 2011-11-25 – 2011-11-27 (×2): 100 mg via ORAL
  Filled 2011-11-24 (×2): qty 1

## 2011-11-24 MED ORDER — METOPROLOL TARTRATE 1 MG/ML IV SOLN: 2.0000 mg | INTRAVENOUS | Status: DC | PRN

## 2011-11-24 MED ORDER — OXYCODONE-ACETAMINOPHEN 5-325 MG PO TABS
1.0000 | ORAL_TABLET | ORAL | Status: DC | PRN
  Administered 2011-11-24 – 2011-11-25 (×4): 2 via ORAL
  Filled 2011-11-24 (×4): qty 2

## 2011-11-24 MED ORDER — EPHEDRINE SULFATE 50 MG/ML IJ SOLN
INTRAMUSCULAR | Status: DC | PRN
  Administered 2011-11-24: 5 mg via INTRAVENOUS
  Administered 2011-11-24: 10 mg via INTRAVENOUS

## 2011-11-24 MED ORDER — TIOTROPIUM BROMIDE MONOHYDRATE 18 MCG IN CAPS
18.0000 ug | ORAL_CAPSULE | Freq: Every day | RESPIRATORY_TRACT | Status: DC
  Administered 2011-11-25 – 2011-11-27 (×3): 18 ug via RESPIRATORY_TRACT
  Filled 2011-11-24: qty 5

## 2011-11-24 MED ORDER — HYDRALAZINE HCL 20 MG/ML IJ SOLN: 10.0000 mg | INTRAMUSCULAR | Status: DC | PRN

## 2011-11-24 MED ORDER — FENTANYL CITRATE 0.05 MG/ML IJ SOLN
INTRAMUSCULAR | Status: DC | PRN
  Administered 2011-11-24 (×3): 50 ug via INTRAVENOUS
  Administered 2011-11-24: 100 ug via INTRAVENOUS

## 2011-11-24 SURGICAL SUPPLY — 51 items
BENZOIN TINCTURE PRP APPL 2/3 (GAUZE/BANDAGES/DRESSINGS) ×2 IMPLANT
CANISTER SUCTION 2500CC (MISCELLANEOUS) ×2 IMPLANT
CANNULA VESSEL W/WING (CANNULA) ×2 IMPLANT
CATH ROBINSON RED A/P 18FR (CATHETERS) ×2 IMPLANT
CLIP LIGATING EXTRA MED SLVR (CLIP) ×2 IMPLANT
CLIP LIGATING EXTRA SM BLUE (MISCELLANEOUS) ×2 IMPLANT
CLOTH BEACON ORANGE TIMEOUT ST (SAFETY) ×2 IMPLANT
CLSR STERI-STRIP ANTIMIC 1/2X4 (GAUZE/BANDAGES/DRESSINGS) ×2 IMPLANT
COVER SURGICAL LIGHT HANDLE (MISCELLANEOUS) ×2 IMPLANT
CRADLE DONUT ADULT HEAD (MISCELLANEOUS) ×2 IMPLANT
DECANTER SPIKE VIAL GLASS SM (MISCELLANEOUS) IMPLANT
DRAIN HEMOVAC 1/8 X 5 (WOUND CARE) IMPLANT
DRAPE WARM FLUID 44X44 (DRAPE) ×2 IMPLANT
DRSG COVADERM 4X8 (GAUZE/BANDAGES/DRESSINGS) ×2 IMPLANT
ELECT REM PT RETURN 9FT ADLT (ELECTROSURGICAL) ×2
ELECTRODE REM PT RTRN 9FT ADLT (ELECTROSURGICAL) ×1 IMPLANT
EVACUATOR SILICONE 100CC (DRAIN) IMPLANT
GEL ULTRASOUND 20GR AQUASONIC (MISCELLANEOUS) ×2 IMPLANT
GLOVE BIOGEL PI IND STRL 6.5 (GLOVE) ×1 IMPLANT
GLOVE BIOGEL PI IND STRL 7.0 (GLOVE) ×1 IMPLANT
GLOVE BIOGEL PI IND STRL 7.5 (GLOVE) ×2 IMPLANT
GLOVE BIOGEL PI INDICATOR 6.5 (GLOVE) ×1
GLOVE BIOGEL PI INDICATOR 7.0 (GLOVE) ×1
GLOVE BIOGEL PI INDICATOR 7.5 (GLOVE) ×2
GLOVE ORTHOPEDIC STR SZ6.5 (GLOVE) ×2 IMPLANT
GLOVE SS BIOGEL STRL SZ 7.5 (GLOVE) ×1 IMPLANT
GLOVE SUPERSENSE BIOGEL SZ 7.5 (GLOVE) ×1
GLOVE SURG SS PI 7.5 STRL IVOR (GLOVE) ×4 IMPLANT
GOWN PREVENTION PLUS XLARGE (GOWN DISPOSABLE) ×4 IMPLANT
GOWN PREVENTION PLUS XXLARGE (GOWN DISPOSABLE) ×2 IMPLANT
GOWN STRL NON-REIN LRG LVL3 (GOWN DISPOSABLE) ×4 IMPLANT
KIT BASIN OR (CUSTOM PROCEDURE TRAY) ×2 IMPLANT
KIT ROOM TURNOVER OR (KITS) ×2 IMPLANT
NEEDLE 22X1 1/2 (OR ONLY) (NEEDLE) IMPLANT
NS IRRIG 1000ML POUR BTL (IV SOLUTION) ×4 IMPLANT
PACK CAROTID (CUSTOM PROCEDURE TRAY) ×2 IMPLANT
PAD ARMBOARD 7.5X6 YLW CONV (MISCELLANEOUS) ×4 IMPLANT
PATCH HEMASHIELD 8X75 (Vascular Products) ×2 IMPLANT
SHUNT CAROTID BYPASS 10 (VASCULAR PRODUCTS) ×2 IMPLANT
SHUNT CAROTID BYPASS 12FRX15.5 (VASCULAR PRODUCTS) IMPLANT
SPECIMEN JAR SMALL (MISCELLANEOUS) ×2 IMPLANT
STRIP CLOSURE SKIN 1/2X4 (GAUZE/BANDAGES/DRESSINGS) ×2 IMPLANT
SUT ETHILON 3 0 PS 1 (SUTURE) IMPLANT
SUT PROLENE 6 0 CC (SUTURE) ×2 IMPLANT
SUT VIC AB 3-0 SH 27 (SUTURE) ×2
SUT VIC AB 3-0 SH 27X BRD (SUTURE) ×2 IMPLANT
SUT VICRYL 4-0 PS2 18IN ABS (SUTURE) ×2 IMPLANT
SYR CONTROL 10ML LL (SYRINGE) IMPLANT
TOWEL OR 17X24 6PK STRL BLUE (TOWEL DISPOSABLE) ×2 IMPLANT
TOWEL OR 17X26 10 PK STRL BLUE (TOWEL DISPOSABLE) ×2 IMPLANT
WATER STERILE IRR 1000ML POUR (IV SOLUTION) ×2 IMPLANT

## 2011-11-24 NOTE — Anesthesia Postprocedure Evaluation (Signed)
Anesthesia Post Note  Patient: [name]  Procedure(s) Performed: Procedure(s) (LRB): ENDARTERECTOMY CAROTID (Left)  Anesthesia type: General  Patient location: PACU  Post pain: Pain level controlled and Adequate analgesia  Post assessment: Post-op Vital signs reviewed, Patient's Cardiovascular Status Stable, Respiratory Function Stable, Patent Airway and Pain level controlled  Last Vitals:  Filed Vitals:   11/24/11 1300  BP:   Pulse: 57  Temp:   Resp: 9    Post vital signs: Reviewed and stable  Level of consciousness: awake, alert  and oriented  Complications: No apparent anesthesia complications

## 2011-11-24 NOTE — Progress Notes (Signed)
Mr. Chien BP 91/40 via Art line and NIBP 93/46. NS bolus 500 cc given per MD orders.

## 2011-11-24 NOTE — Progress Notes (Signed)
Utilization review completed.  

## 2011-11-24 NOTE — Transfer of Care (Signed)
Immediate Anesthesia Transfer of Care Note  Patient: [name]  Procedure(s) Performed: Procedure(s) (LRB) with comments: ENDARTERECTOMY CAROTID (Left)  Patient Location: PACU  Anesthesia Type: General  Level of Consciousness: awake and alert   Airway & Oxygen Therapy: Patient Spontanous Breathing and Patient connected to nasal cannula oxygen  Post-op Assessment: Report given to PACU RN and Post -op Vital signs reviewed and stable  Post vital signs: Reviewed and stable  Complications: No apparent anesthesia complications

## 2011-11-24 NOTE — OR Nursing (Signed)
Patient displayed equal bilateral strength in hands and feet, tongue midline; pre-op and post-op. 

## 2011-11-24 NOTE — Op Note (Signed)
[facility] [place]  Patient name: [name] MRN: [id number] DOB: 11-02-1940 Sex: male  11/24/2011 Pre-operative Diagnosis: Asymptomatic left carotid stenosis Post-operative diagnosis:  Same Surgeon:  Larina Earthly, M.D. Assistants:  Brabham,MD Procedure:    left carotid Endarterectomy with Dacron patch angioplasty Anesthesia:  General Blood Loss:  See anesthesia record Specimens:  Carotid Plaque to pathology  Indications for surgery:  Asymptomatic  Procedure in detail:  The patient was taken to the operating and placed in the supine position. The neck was prepped and draped in the usual sterile fashion. An incision was made anterior to the sternocleidomastoid muscle and continued with electrocautery through the platysma muscle. The muscle was retracted posteriorly and the carotid sheath was opened. The facial vein was ligated with 2-0 silk ties and divided. The common carotid artery was encircled with an umbilical tape and Rummel tourniquet. Dissection was continued onto the carotid bifurcation. The superior thyroid artery was controlled with a 2-0 silk Potts tie. The external carotid organ was encircled with a vessel loop and the internal carotid was encircled with umbilical tape and Rummel tourniquet. The hypoglossal and vagus nerves were identified and preserved.  The patient was given systemic heparinization. After adequate circulation time, the internal,external and common carotid arteries were occluded. The common carotid was opened with an 11 blade and the arteriotomy was continued with Potts scissors onto the internal carotid artery. A 10 shunt was passed up the internal carotid artery, allowed to back bleed, and then passed down the common carotid artery. The shunt was secured with Rummel tourniquet. The endarterectomy was begun on the common carotid artery  plaque was divided proximally with Potts scissors. The endarterectomy was continued onto the carotid  bifurcation. The external carotid was endarterectomized by eversion technique and the internal carotid artery was endarterectomized in an open fashion. Remaining debris was removed from the endarterectomy plane. A Dacron patch was brought to the field and sewn as a patch angioplasty. Prior to completing the anastomosis, the shunt was removed and the usual flushing maneuvers were undertaken. The anastomosis was then completed and flow was restored first to the external and then the internal carotid artery. Excellent flow characteristics were noted with hand-held Doppler in the internal and external carotid arteries.  The patient was given protamine to reverse the heparin. Hemostasis was obtained with electrocautery. The wounds were irrigated with saline. The wound was closed by first reapproximating the sternocleidomastoid muscle over the carotid artery with interrupted 3-0 Vicryl sutures. Next, the platysma was closed with a running 3-0 Vicryl suture. The skin was closed with a 4-0 subcuticular Vicryl suture. Benzoin and Steri-Strips were applied to the incision. A sterile dressing was placed over the incision. All sponge and needle counts were correct. The patient was awakened in the operating room, neurologically intact. They were transferred to the PACU in stable condition.   Disposition:  To PACU in stable condition,neurologically intact    Larina Earthly, M.D. [facility] Office: [phone] Pager:  [phone]

## 2011-11-24 NOTE — Progress Notes (Signed)
VASCULAR AND VEIN SURGERY POST - OP CEA PROGRESS NOTE  Date of Surgery: 11/24/2011 Surgeon: Surgeon(s): Larina Earthly, MD Nada Libman, MD Day of Surgery left Carotid Endarterectomy .  HPI: [name] is a 71 y.o. male who is Day of Surgery left Carotid Endarterectomy . Patient is doing well. Patient denies headache; Patient denies difficulty swallowing; denies weakness in upper or lower extremities; Pt. reports grogginess and right hand feeling "funny" - but denies weakness or heaviness in right arm  IMAGING: No results found.  Significant Diagnostic Studies: CBC Lab Results  Component Value Date   WBC 6.7 11/17/2011   HGB 16.4 11/17/2011   HCT 47.5 11/17/2011   MCV 97.9 11/17/2011   PLT 219 11/17/2011    BMET    Component Value Date/Time   NA 140 11/17/2011 0825   K 4.3 11/17/2011 0825   CL 100 11/17/2011 0825   CO2 29 11/17/2011 0825   GLUCOSE 106* 11/17/2011 0825   BUN 15 11/17/2011 0825   CREATININE 0.90 11/17/2011 0825   CALCIUM 9.8 11/17/2011 0825   GFRNONAA 84* 11/17/2011 0825   GFRAA >90 11/17/2011 0825    COAG Lab Results  Component Value Date   INR 1.00 11/24/2011   INR 1.02 11/17/2011   INR 1.02 12/23/2010   No results found for this basename: PTT      Intake/Output Summary (Last 24 hours) at 11/24/11 1714 Last data filed at 11/24/11 1515  Gross per 24 hour  Intake   1560 ml  Output    200 ml  Net   1360 ml    Physical Exam:  BP Readings from Last 3 Encounters:  11/24/11 111/53  11/24/11 111/53  11/17/11 131/72   Temp Readings from Last 3 Encounters:  11/24/11 97.7 F (36.5 C) Oral  11/24/11 97.7 F (36.5 C) Oral  11/17/11 98.1 F (36.7 C)    SpO2 Readings from Last 3 Encounters:  11/24/11 94%  11/24/11 94%  11/17/11 97%   Pulse Readings from Last 3 Encounters:  11/24/11 79  11/24/11 79  11/17/11 80    Pt is A&O x 3  Speech is fluent left Neck Wound is healing well Patient with Negative tongue deviation and Negative facial  droop Pt has good and equal strength in all extremities  Assessment: [name] is a 71 y.o. male is S/P Left Carotid endarterectomy Pt is  taking po well without diff swallowing Mild H/A C/O right hand feeling "funny" but has excellent strength in grip and arm   Plan: Discharge to: Home in am if voids, takes PO and is neurologically stable Follow-up in 2 weeks   Havilah Topor J [phone] 11/24/2011 5:14 PM

## 2011-11-24 NOTE — Discharge Summary (Signed)
[facility] Discharge Summary  [name] 03-01-1941 71 y.o. male  [id number]  Admission Date: 11/24/2011  Discharge Date: 11/25/11  Physician: Larina Earthly, MD  Admission Diagnosis: Left Internal Carotid Artery Stenosis    HPI:   This is a 71 y.o. male presents today for evaluation of severe ptosis of his left internal carotid artery. He is well known to me from a prior right carotid endarterectomy in 2002. His had no difficulty since that time. He reports in April he began having visual changes and was seen and felt to have potential embolus as the cause of this. He has undergone carotid duplex evaluation severe showing extensive calcification and moderate to severe stenosis of his internal carotid artery. He specifically denies any focal neurologic deficits other than his visual changes. Tissues significant for multiple prior coronary stent placement. He reports that he has a extensive dating back to 39. Most recent was one year ago when he was having chest pain.   Hospital Course:  The patient was admitted to the hospital and taken to the operating room on 11/24/2011 and underwent left carotid endarterectomy.  The pt tolerated the procedure well and was transported to the PACU in good condition.   By POD 1, the pt neuro status in tact.  He does have some numbness in his right hand.  States after they stuck him for the A-line, he has numbness in that hand as well as motor issues of the thumb.  [name] ordered an MRI, but this could not be done due to pt's claustrophobia.  The MRI was scheduled for the following day since the pt needed to be NPO.   The stroke team was also consulted.  He had an MRI/MRA/CTA of neck and Dr. 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 MRA was also benign which revealed  excellent flow in the distal internal carotid artery on the left and right but did not visualize carotid endarterectomy site in left neck  Patient required conscious sedation for study because of claustrophobia  Discussed these findings with the patient and daughter and explained need for CT angiogram of neck to look at left carotid endarterectomy site to be certain there is no source of emboli at surgical site.  I reviewed the above study with Dr. Elie Goody in radiology and they will expedite performing CTA of neck at this time. Assuming surgical site looks good will hope to DC patient in a.m. if neurology in agreement.  Neuro exam at this time remains unchanged with patient alert and oriented x3 with no significant focal weakness other than right and complaining of some numbness and right fourth and fifth fingers-no discoordination or other neuro deficits  CT angiogram of the neck completed and reviewed by me  Left carotid endarterectomy site widely patent with no evidence of thrombus at the operative site. Minimal kinking at the distal end of endarterectomy patch which does not appear significant and are not associated with any intraluminal thrombus.  Will observe patient tonight and check labs in a.m.  Okay to DC home in a.m. if okay with neurology service-will continue patient on aspirin and Plavix  By POD 3, the pt is doing well and is d/c'd home.  Dr. Hart Rochester spoke with neuro MD and it was ok for the pt to go home on ASA 81 mg qday with  Plavix.  The remainder of the hospital course consisted of increasing mobilization and increasing intake of solids without difficulty.  Labs:  CBC    Component Value Date/Time   WBC 8.2 11/25/2011 0440   RBC 3.90* 11/25/2011 0440   HGB 12.9* 11/25/2011 0440   HCT 38.2* 11/25/2011 0440   PLT 173 11/25/2011 0440   MCV 97.9 11/25/2011 0440   MCH 33.1 11/25/2011 0440   MCHC 33.8 11/25/2011 0440   RDW 13.0 11/25/2011 0440   LYMPHSABS 1.5 12/22/2010 1650   MONOABS 0.6  12/22/2010 1650   EOSABS 0.3 12/22/2010 1650   BASOSABS 0.0 12/22/2010 1650    BMET    Component Value Date/Time   NA 138 11/27/2011 0400   K 3.4* 11/27/2011 0400   CL 101 11/27/2011 0400   CO2 25 11/27/2011 0400   GLUCOSE 109* 11/27/2011 0400   BUN 10 11/27/2011 0400   CREATININE 0.72 11/27/2011 0400   CALCIUM 9.0 11/27/2011 0400   GFRNONAA >90 11/27/2011 0400   GFRAA >90 11/27/2011 0400   Lipid Panel     Component Value Date/Time   CHOL 141 11/27/2011 0400   TRIG 59 11/27/2011 0400   HDL 51 11/27/2011 0400   CHOLHDL 2.8 11/27/2011 0400   VLDL 12 11/27/2011 0400   LDLCALC 78 11/27/2011 0400     Basename 11/24/11 0732  INR 1.00     Discharge Instructions:   The patient is discharged to home with extensive instructions on wound care and progressive ambulation.  They are instructed not to drive or perform any heavy lifting until returning to see the physician in his office.  Discharge Orders    Future Appointments: Provider: Department: Dept Phone: Center:   12/07/2011 11:45 AM Larina Earthly, MD [facility] [phone] [facility]     Future Orders Please Complete By Expires   Resume previous diet      Driving Restrictions      Comments:   No driving for 2 weeks   Lifting restrictions      Comments:   No lifting for 4 weeks   Call MD for:  temperature >100.5      Call MD for:  redness, tenderness, or signs of infection (pain, swelling, bleeding, redness, odor or green/yellow discharge around incision site)      Call MD for:  severe or increased pain, loss or decreased feeling  in affected limb(s)      Increase activity slowly      Comments:   Walk with assistance use walker or cane as needed   May shower       Scheduling Instructions:   Friday   may wash over wound with mild soap and water      No dressing needed      CAROTID Sugery: Call MD for difficulty swallowing or speaking; weakness in arms or legs that is a new symtom; severe headache.  If you have increased swelling in the neck and/or  are  having difficulty breathing, CALL 911         Discharge Diagnosis:  Left Internal Carotid Artery Stenosis   Secondary Diagnosis: Patient Active Problem List  Diagnosis  . PURE HYPERCHOLESTEROLEMIA  . HYPERLIPIDEMIA  . ANXIETY  . AMAUROSIS FUGAX  . HYPERTENSION, BENIGN  . MYOCARDIAL INFARCTION, HX OF  . CORONARY ARTERY DISEASE  . CORONARY ATHEROSCLEROSIS NATIVE CORONARY ARTERY  . PERIPHERAL VASCULAR DISEASE  . HEMORRHOIDS  . CHRONIC OBSTRUCTIVE PULMONARY DISEASE, ACUTE EXACERBATION  . EMPHYSEMA  . COPD  .  OSTEOARTHRITIS  . PSA, INCREASED  . Smoker  . Occlusion and stenosis of carotid artery without mention of cerebral infarction  . CHEST PAIN  . Palpitations  . Ischemic cardiomyopathy  . Grief reaction  . Carotid artery disease  . Pre-operative cardiovascular examination   Past Medical History  Diagnosis Date  . COPD (chronic obstructive pulmonary disease)   . CAD (coronary artery disease)     Ant MI 68  . HTN (hypertension)   . Hyperlipidemia   . Osteoarthritis   . Peripheral vascular disease   . Prostate cancer   . Carotid artery disease     Right CEA 2002 per [name]  . Myocardial infarction   . GERD (gastroesophageal reflux disease)     occ  . CHF (congestive heart failure)      Broderick, Fonseca  Home Medication Instructions ZOX:[id number]   Printed on:11/27/11 0902  Medication Information                    aspirin 81 MG tablet Take 81 mg by mouth daily.             nitroGLYCERIN (NITROSTAT) 0.4 MG SL tablet Place 0.4 mg under the tongue every 5 (five) minutes as needed. For chest pain           Tamsulosin HCl (FLOMAX) 0.4 MG CAPS Take 0.4 mg by mouth daily.            losartan (COZAAR) 50 MG tablet Take 1 tablet (50 mg total) by mouth daily.           rosuvastatin (CRESTOR) 5 MG tablet Take 1 tablet (5 mg total) by mouth at bedtime.           Fluticasone-Salmeterol (ADVAIR) 500-50 MCG/DOSE AEPB Inhale 1 puff into the lungs every 12  (twelve) hours.           metoprolol tartrate (LOPRESSOR) 25 MG tablet Take 0.5 tablets (12.5 mg total) by mouth 2 (two) times daily. 1/2 tablet twice daily           ALPRAZolam (XANAX) 0.5 MG tablet Take 0.5 mg by mouth at bedtime as needed. Sleep or anxiety           Multiple Vitamin (MULTIVITAMIN WITH MINERALS) TABS Take 1 tablet by mouth daily.           loratadine (CLARITIN) 10 MG tablet Take 10 mg by mouth daily.           tiotropium (SPIRIVA) 18 MCG inhalation capsule Place 18 mcg into inhaler and inhale daily.           albuterol (PROVENTIL HFA;VENTOLIN HFA) 108 (90 BASE) MCG/ACT inhaler Inhale 2 puffs into the lungs every 6 (six) hours as needed. For shortness of breath           clopidogrel (PLAVIX) 75 MG tablet Take 1 tablet (75 mg total) by mouth daily. #30 2RF (refills per neuro/cards)          nicotine (NICODERM CQ - DOSED IN MG/24 HOURS) 14 mg/24hr patch Place 1 patch onto the skin daily. OTC          oxyCODONE (ROXICODONE) 5 MG immediate release tablet Take 1 tablet (5 mg total) by mouth every 6 (six) hours as needed for pain. #30 NR            Disposition: home  Patient's condition: is Good  Follow up: 1. Dr.  [name] in 2 weeks.   Lelon Mast  [facility] [phone] 11/24/2011  3:31 PM

## 2011-11-24 NOTE — Anesthesia Procedure Notes (Signed)
Procedure Name: Intubation Date/Time: 11/24/2011 8:59 AM Performed by: Garen Lah Pre-anesthesia Checklist: Timeout performed, Patient identified, Emergency Drugs available, Suction available and Patient being monitored Patient Re-evaluated:Patient Re-evaluated prior to inductionOxygen Delivery Method: Circle system utilized Preoxygenation: Pre-oxygenation with 100% oxygen Intubation Type: IV induction Ventilation: Mask ventilation without difficulty and Oral airway inserted - appropriate to patient size Laryngoscope Size: Mac and 3 Grade View: Grade II Tube type: Oral Tube size: 8.0 mm Number of attempts: 1 Airway Equipment and Method: Stylet Placement Confirmation: ETT inserted through vocal cords under direct vision,  positive ETCO2 and breath sounds checked- equal and bilateral Secured at: 22 cm Tube secured with: Tape Dental Injury: Teeth and Oropharynx as per pre-operative assessment

## 2011-11-24 NOTE — Preoperative (Signed)
Beta Blockers   Reason not to administer Beta Blockers:Not Applicable. Metoprolol 25 mg @ 0500 11/24/11

## 2011-11-24 NOTE — Anesthesia Preprocedure Evaluation (Addendum)
Anesthesia Evaluation  Patient identified by MRN, date of birth, ID band Patient awake    Reviewed: Allergy & Precautions, H&P , NPO status , Patient's Chart, lab work & pertinent test results, reviewed documented beta blocker date and time   Airway Mallampati: II  Neck ROM: full    Dental  (+) Edentulous Upper, Edentulous Lower and Dental Advisory Given   Pulmonary COPD COPD inhaler, Current Smoker,          Cardiovascular hypertension, Pt. on medications and Pt. on home beta blockers + CAD, + Past MI, + Cardiac Stents and +CHF     Neuro/Psych Anxiety    GI/Hepatic GERD-  Medicated and Controlled,  Endo/Other    Renal/GU      Musculoskeletal   Abdominal   Peds  Hematology   Anesthesia Other Findings   Reproductive/Obstetrics                          Anesthesia Physical Anesthesia Plan  ASA: III  Anesthesia Plan: General   Post-op Pain Management:    Induction: Intravenous  Airway Management Planned: Oral ETT  Additional Equipment: Arterial line  Intra-op Plan:   Post-operative Plan: Extubation in OR  Informed Consent: I have reviewed the patients History and Physical, chart, labs and discussed the procedure including the risks, benefits and alternatives for the proposed anesthesia with the patient or authorized representative who has indicated his/her understanding and acceptance.   Dental advisory given  Plan Discussed with: CRNA and Surgeon  Anesthesia Plan Comments:        Anesthesia Quick Evaluation

## 2011-11-24 NOTE — H&P (Signed)
[name]  Description:  71 year old male  10/12/2011 11:00 AM Office Visit Provider:  Xayvion Shirah, MD  MRN: [id number] Department:  [facility]            Diagnoses  Reason for Visit    Occlusion and stenosis of carotid artery without mention of cerebral infarction - Primary  Carotid   433.10  new carotid left side lab study today           Vitals - Last Recorded       BP  Pulse  Resp  Ht  Wt  BMI    148/75  74  20  5' 10.5" (1.791 m)  196 lb (88.905 kg)  27.73 kg/m2        Vitals History Recorded            Progress Notes     Stellan Vick, MD 10/12/2011 5:37 PM Signed  The patient presents today for evaluation of severe ptosis of his left internal carotid artery. He is well known to me from a prior right carotid endarterectomy in 2002. His had no difficulty since that time. He reports in April he began having visual changes and was seen and felt to have potential embolus as the cause of this. He has undergone carotid duplex evaluation severe showing extensive calcification and moderate to severe stenosis of his internal carotid artery. He specifically denies any focal neurologic deficits other than his visual changes. Tissues significant for multiple prior coronary stent placement. He reports that he has a extensive dating back to 78. Most recent was one year ago when he was having chest pain.     Past Medical History     Diagnosis  Date     .  COPD (chronic obstructive pulmonary disease)      .  CAD (coronary artery disease)        Ant MI 53     .  HTN (hypertension)      .  Hyperlipidemia      .  Osteoarthritis      .  Peripheral vascular disease      .  Prostate cancer      .  Carotid artery disease        Right CEA 2002 per [name]         History     Substance Use Topics     .  Smoking status:  Current Everyday Smoker -- 0.3 packs/day for 50 years       Types:  Cigarettes       Last Attempt to Quit:  02/22/2010     .  Smokeless  tobacco:  Never Used      Comment: pt started smoking wife just recently passed, 1/2 PPD      .  Alcohol Use:  Yes           Family History      Problem  Relation  Age of Onset      .  Stroke  Father       .  Heart disease  Mother         pacemaker       .  Other  Brother          benign brain tumor             Allergies       Allergen  Reactions       .  Sulfonamide Derivatives         Current outpatient prescriptions:ALPRAZolam (XANAX) 0.5 MG tablet, Take 1 tablet (0.5 mg total) by mouth at bedtime as needed for sleep or anxiety., Disp: 30 tablet, Rfl: 3; aspirin 81 MG tablet, Take 81 mg by mouth daily. , Disp: , Rfl: ; clopidogrel (PLAVIX) 75 MG tablet, TAKE 1 TABLET (75 MG TOTAL) BY MOUTH DAILY., Disp: 30 tablet, Rfl: 6  Fluticasone-Salmeterol (ADVAIR) 500-50 MCG/DOSE AEPB, Inhale 1 puff into the lungs every 12 (twelve) hours., Disp: 60 each, Rfl: 6; ipratropium-albuterol (DUONEB) 0.5-2.5 (3) MG/3ML SOLN, Take 3 mLs by nebulization every 6 (six) hours as needed. , Disp: , Rfl: ; loratadine (CLARITIN) 10 MG tablet, TAKE 1 TABLET EVERY DAY, Disp: 30 tablet, Rfl: 5; losartan (COZAAR) 50 MG tablet, Take 1 tablet (50 mg total) by mouth daily., Disp: 30 tablet, Rfl: 11  metoprolol tartrate (LOPRESSOR) 25 MG tablet, Take 0.5 tablets (12.5 mg total) by mouth 2 (two) times daily. 1/2 tablet twice daily, Disp: 30 tablet, Rfl: 3; nitroGLYCERIN (NITROSTAT) 0.4 MG SL tablet, Place 0.4 mg under the tongue every 5 (five) minutes as needed. , Disp: , Rfl: ; SPIRIVA HANDIHALER 18 MCG inhalation capsule, INHALE 1 CAPSULE VIA HANDIHALER ONCE DAILY AT THE SAME TIME EVERY DAY, Disp: 30 each, Rfl: 5  Tamsulosin HCl (FLOMAX) 0.4 MG CAPS, Take 0.4 mg by mouth daily. , Disp: , Rfl: ; VENTOLIN HFA 108 (90 BASE) MCG/ACT inhaler, INHALE 2 PUFFS INTO THE LUNGS EVERY 4 (FOUR) HOURS AS NEEDED FOR WHEEZING OR SHORTNESS OF BREATH., Disp: 1 Inhaler, Rfl: 11; rosuvastatin (CRESTOR) 5 MG tablet, Take 1 tablet (5 mg total) by  mouth at bedtime., Disp: 30 tablet, Rfl: 11  BP 148/75  Pulse 74  Resp 20  Ht 5' 10.5" (1.791 m)  Wt 196 lb (88.905 kg)  BMI 27.73 kg/m2  Body mass index is 27.73 kg/(m^2).  Review of systems positive shortness of breath with exertion  Her logically he does have occasional numbness in his index and middle finger on the left. Related to peripheral nerve review of systems otherwise negative.  Physical exam well-developed well-nourished white male appearing stated age in no acute distress  Carotid arteries reveal well-healed right neck incision and a soft bruit on the left  Radial pulses are 2+ bilaterally  Heart regular rate and rhythm without murmur  Chest clear bilaterally without wheezes  Abdomen soft nontender no masses noted no aneurysm palpable  Skin without ulcers or rashes  Neurologically he is grossly intact  I reviewed his outpatient carotid duplex and we repeated this on a limited basis to determine if he was a candidate for endarterectomy based on duplex alone. He does have a slightly high bifurcation but does have normal internal carotid artery above the stenosis on the left.  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 I have recommended endarterectomy based on this. We will assure from his cardiologist that he is no need for further cardiac evaluation if so we'll schedule him for endarterectomy at his convenience   MCALHANY,CHRISTOPHER, MD 10/13/2011 11:12 AM Signed  Tawanna Cooler, He was doing well from a cardiac standpoint when I saw him in June. I do not think he needs any ischemic workup before his CEA. He is on ASA and Plavix but his last stent procedure was in January 2012 so both could be held for CEA if needed. Let me know if you need anything further for clearance. Thanks for  seeing him. Chris               Addendum:  The patient has been re-examined and re-evaluated.  The patient's history and physical has been  reviewed and is unchanged.    [name] is a 71 y.o. male is being admitted with Left Internal Carotid Artery Stenosis . All the risks, benefits and other treatment options have been discussed with the patient. The patient has consented to proceed with Procedure(s): ENDARTERECTOMY CAROTID as a surgical intervention.  Joelynn Dust 11/24/2011 8:09 AM [facility]

## 2011-11-25 ENCOUNTER — Inpatient Hospital Stay (HOSPITAL_COMMUNITY): Payer: Medicare Other

## 2011-11-25 ENCOUNTER — Encounter (HOSPITAL_COMMUNITY): Payer: Self-pay

## 2011-11-25 LAB — BASIC METABOLIC PANEL
Calcium: 8.4 mg/dL (ref 8.4–10.5)
Creatinine, Ser: 0.85 mg/dL (ref 0.50–1.35)
GFR calc non Af Amer: 86 mL/min — ABNORMAL LOW (ref >90)
Sodium: 138 mEq/L (ref 135–145)

## 2011-11-25 LAB — CBC
MCH: 33.1 pg (ref 26.0–34.0)
MCHC: 33.8 g/dL (ref 30.0–36.0)
MCV: 97.9 fL (ref 78.0–100.0)
Platelets: 173 10*3/uL (ref 150–400)

## 2011-11-25 MED ORDER — MIDAZOLAM HCL 2 MG/2ML IJ SOLN
1.0000 mg | Freq: Once | INTRAMUSCULAR | Status: AC
  Administered 2011-11-25: 1 mg via INTRAVENOUS
  Filled 2011-11-25: qty 2

## 2011-11-25 MED ORDER — NICOTINE 14 MG/24HR TD PT24
14.0000 mg | MEDICATED_PATCH | Freq: Every day | TRANSDERMAL | Status: DC
  Administered 2011-11-25 – 2011-11-27 (×3): 14 mg via TRANSDERMAL
  Filled 2011-11-25 (×3): qty 1

## 2011-11-25 MED ORDER — DIPHENHYDRAMINE HCL 25 MG PO CAPS: 25.0000 mg | ORAL_CAPSULE | Freq: Four times a day (QID) | ORAL | Status: DC | PRN

## 2011-11-25 NOTE — Progress Notes (Addendum)
Pt to MRI.  Complaints of feeling claustrophobic, Dr Early notified and 1mg  of Versed ordered and given.  Pt with no complaints, eyes closed upon being transported to radiology.

## 2011-11-25 NOTE — Progress Notes (Addendum)
[facility] Progress Note  11/25/2011 7:11 AM POD 1  Subjective:  States he has some numbness in his right hand from the A-line stick yesterday.  This happened at that time.  Afebrile x 24 hrs 95%2LO2NC Filed Vitals:   11/25/11 0447  BP: 124/52  Pulse: 72  Temp:   Resp: 20     Physical Exam: Neuro:  In tact without deficit.  No trouble swallowing.  Hand grips are equal bilaterally. Incision:  C/d/i with steri strips in place.  CBC    Component Value Date/Time   WBC 8.2 11/25/2011 0440   RBC 3.90* 11/25/2011 0440   HGB 12.9* 11/25/2011 0440   HCT 38.2* 11/25/2011 0440   PLT 173 11/25/2011 0440   MCV 97.9 11/25/2011 0440   MCH 33.1 11/25/2011 0440   MCHC 33.8 11/25/2011 0440   RDW 13.0 11/25/2011 0440   LYMPHSABS 1.5 12/22/2010 1650   MONOABS 0.6 12/22/2010 1650   EOSABS 0.3 12/22/2010 1650   BASOSABS 0.0 12/22/2010 1650    BMET    Component Value Date/Time   NA 138 11/25/2011 0440   K 3.6 11/25/2011 0440   CL 103 11/25/2011 0440   CO2 28 11/25/2011 0440   GLUCOSE 108* 11/25/2011 0440   BUN 13 11/25/2011 0440   CREATININE 0.85 11/25/2011 0440   CALCIUM 8.4 11/25/2011 0440   GFRNONAA 86* 11/25/2011 0440   GFRAA >90 11/25/2011 0440     Intake/Output Summary (Last 24 hours) at 11/25/11 0711 Last data filed at 11/25/11 0446  Gross per 24 hour  Intake   1730 ml  Output   1275 ml  Net    455 ml      Assessment/Plan:  This is a 71 y.o. male who is s/p left CEA POD 1  -pt doing well this am. -he has voided, but needs to ambulate with assist and tolerate diet. -mobilize and wean O2 as tolerated. -home later this am or [place] afternoon.  Doreatha Massed, PA-C [facility] [phone]  I have examined the patient, reviewed and agree with above. Neck incision looks quite good. Minimal soreness. I am concerned regarding his right hand. This is mainly in his, and index finger. He has significant weakness in mobility and is some he does report some numbness his  entire hand. He has excellent grip strength with no motor deficits. He has no other neurologic deficit. I discussed this at length with the patient. I suspect this was related to A-line. He had multiple sticks bilaterally with difficulty line access. I suspect this is performed and reviewed her patient but since it is his right side left carotid is concerning this may be related to a central issue as well. We will obtain an MRI of his brain today to rule out infarct. Christana Angelica, MD 11/25/2011 7:43 AM

## 2011-11-25 NOTE — Consult Note (Signed)
[facility] CONSULT NOTE     Reason for Consult: right thumb and index finger weakness S/P line placement    HPI:    [name] is an 71 y.o. male Who underwent a CEA 11/24/11.  There was some difficulty with his aline stick on that side.  Since then he states he cannot extend his right thumb or index finger nor can he flex his thumb/index finger.  He denies any sensory changes.  He also denies any right arm weakness in abduction, bicep flexion, tricep extension, wrist flexion or extension. Family members state he has some improvement of movement today.  Past Medical History  Diagnosis Date  . COPD (chronic obstructive pulmonary disease)   . CAD (coronary artery disease)     Ant MI 46  . HTN (hypertension)   . Hyperlipidemia   . Osteoarthritis   . Peripheral vascular disease   . Prostate cancer   . Carotid artery disease     Right CEA 2002 per [name]  . Myocardial infarction   . GERD (gastroesophageal reflux disease)     occ  . CHF (congestive heart failure)     Past Surgical History  Procedure Date  . Coronary stent placement 97, 2000, 2012    8 stents  . Doppler echocardiography 1995    neg  . Carotid endarterectomy 03-2000    right  . Colonoscopy 04-2001    polyp  . Cardiovascular stress test 12-2006  . Cardiac catheterization   . Vascular surgery   . Endarterectomy 11/24/2011    Procedure: ENDARTERECTOMY CAROTID;  Surgeon: Larina Earthly, MD;  Location: [facility] OR;  Service: Vascular;  Laterality: Left;    Family History  Problem Relation Age of Onset  . Stroke Father   . Heart disease Mother     pacemaker  . Other Brother     benign brain tumor    Social History:  reports that he has been smoking Cigarettes.  He has a 55 pack-year smoking history. He has never used smokeless tobacco. He reports that he drinks alcohol. He reports that he does not use illicit drugs.  Allergies  Allergen Reactions  . Sulfonamide Derivatives       Medications:    Prior to Admission:  Prescriptions prior to admission  Medication Sig Dispense Refill  . albuterol (PROVENTIL HFA;VENTOLIN HFA) 108 (90 BASE) MCG/ACT inhaler Inhale 2 puffs into the lungs every 6 (six) hours as needed. For shortness of breath      . ALPRAZolam (XANAX) 0.5 MG tablet Take 0.5 mg by mouth at bedtime as needed. Sleep or anxiety      . aspirin 81 MG tablet Take 81 mg by mouth daily.        . clopidogrel (PLAVIX) 75 MG tablet Take 75 mg by mouth daily.      . Fluticasone-Salmeterol (ADVAIR) 500-50 MCG/DOSE AEPB Inhale 1 puff into the lungs every 12 (twelve) hours.  60 each  6  . loratadine (CLARITIN) 10 MG tablet Take 10 mg by mouth daily.      Marland Kitchen losartan (COZAAR) 50 MG tablet Take 1 tablet (50 mg total) by mouth daily.  30 tablet  11  . metoprolol tartrate (LOPRESSOR) 25 MG tablet Take 0.5 tablets (12.5 mg total) by mouth 2 (two) times daily. 1/2 tablet twice daily  30 tablet  3  . rosuvastatin (  CRESTOR) 5 MG tablet Take 1 tablet (5 mg total) by mouth at bedtime.  30 tablet  11  . Tamsulosin HCl (FLOMAX) 0.4 MG CAPS Take 0.4 mg by mouth daily.       Marland Kitchen tiotropium (SPIRIVA) 18 MCG inhalation capsule Place 18 mcg into inhaler and inhale daily.      . Multiple Vitamin (MULTIVITAMIN WITH MINERALS) TABS Take 1 tablet by mouth daily.      . nitroGLYCERIN (NITROSTAT) 0.4 MG SL tablet Place 0.4 mg under the tongue every 5 (five) minutes as needed. For chest pain       Scheduled:   . aspirin EC  325 mg Oral Daily  . atorvastatin  10 mg Oral q1800  . cefUROXime (ZINACEF)  IV  1.5 g Intravenous Q12H  . clopidogrel  75 mg Oral Daily  . docusate sodium  100 mg Oral Daily  . Fluticasone-Salmeterol  1 puff Inhalation Q12H  . HYDROmorphone      . HYDROmorphone      . HYDROmorphone      . losartan  50 mg Oral Daily  . metoprolol tartrate  12.5 mg Oral BID  . midazolam  1 mg Intravenous Once  . Tamsulosin HCl  0.4 mg Oral QPC supper  . tiotropium  18 mcg Inhalation  Daily    Review of Systems - General ROS: negative for - chills, fatigue, fever or hot flashes Hematological and Lymphatic ROS: negative for - bruising, fatigue, jaundice or pallor Endocrine ROS: negative for - hair pattern changes, hot flashes, mood swings or skin changes Respiratory ROS: negative for - cough, hemoptysis, orthopnea or wheezing Cardiovascular ROS: negative for - dyspnea on exertion, orthopnea, palpitations or shortness of breath Gastrointestinal ROS: negative for - abdominal pain, appetite loss, blood in stools, diarrhea or hematemesis Musculoskeletal ROS: negative for - joint pain, joint stiffness, joint swelling or muscle pain Neurological ROS: positive for - weakness in right thumb Dermatological ROS: negative for dry skin, pruritus and rash   Blood pressure 123/57, pulse 90, temperature 97.5 F (36.4 C), temperature source Oral, resp. rate 26, height 5\' 10"  (1.778 m), weight 87.9 kg (193 lb 12.6 oz), SpO2 91.00%.   Neurologic Examination:   Mental Status: Alert, oriented X 3.  Speech fluent without evidence of aphasia. Able to follow 3 step commands without difficulty. Thought content appropriate Cranial Nerves: II-Visual fields grossly intact. III/IV/VI-Extraocular movements intact.  Pupils reactive bilaterally. Ptosis not present. V/VII-Smile symmetric VIII-grossly intact IX/X-normal gag XI-bilateral shoulder shrug XII-midline tongue extension Motor: 5/5 bilaterally with normal tone and bulk--Except in his right hand.  He has 5/5 wrist ext/flexion, finger extension flexion of middle/ring/small finger but 4/5 weakness in extension of his thumb and index finger. He also has some weakness of his thumb flexion as well.  Sensory: Pinprick and light touch intact throughout, bilaterally--He denies any sensory changes to PP, LT throughout his hand and it is the same bilaterally.  Deep Tendon Reflexes:  Right: Upper Extremity (brisk)  Left: Upper extremity  (brisk)  biceps (C-5 to C-6) 2/4   biceps (C-5 to C-6) 2/4 tricep (C7) 2/4    triceps (C7) 2/4 Brachioradialis (C6) 2/4  Brachioradialis (C6) 2/4  Lower Extremity Lower Extremity  quadriceps (L-2 to L-4) 2/4   quadriceps (L-2 to L-4) 2/4 Achilles (S1) 2/4   Achilles (S1) 2/4      Plantars:      Right:  equivical     Left:  equivical Cerebellar: Normal finger-to-nose, normal heel-to-shin test.  Lab Results  Component Value Date/Time   CHOL 143 04/22/2011  9:01 AM    Results for orders placed during the hospital encounter of 11/24/11 (from the past 48 hour(s))  PROTIME-INR     Status: Normal   Collection Time   11/24/11  7:32 AM      Component Value Range Comment   Prothrombin Time 13.4  11.6 - 15.2 seconds    INR 1.00  0.00 - 1.49   APTT     Status: Normal   Collection Time   11/24/11  7:32 AM      Component Value Range Comment   aPTT 35  24 - 37 seconds   CBC     Status: Abnormal   Collection Time   11/25/11  4:40 AM      Component Value Range Comment   WBC 8.2  4.0 - 10.5 K/uL    RBC 3.90 (*) 4.22 - 5.81 MIL/uL    Hemoglobin 12.9 (*) 13.0 - 17.0 g/dL    HCT 40.9 (*) 81.1 - 52.0 %    MCV 97.9  78.0 - 100.0 fL    MCH 33.1  26.0 - 34.0 pg    MCHC 33.8  30.0 - 36.0 g/dL    RDW 91.4  78.2 - 95.6 %    Platelets 173  150 - 400 K/uL   BASIC METABOLIC PANEL     Status: Abnormal   Collection Time   11/25/11  4:40 AM      Component Value Range Comment   Sodium 138  135 - 145 mEq/L    Potassium 3.6  3.5 - 5.1 mEq/L    Chloride 103  96 - 112 mEq/L    CO2 28  19 - 32 mEq/L    Glucose, Bld 108 (*) 70 - 99 mg/dL    BUN 13  6 - 23 mg/dL    Creatinine, Ser 2.13  0.50 - 1.35 mg/dL    Calcium 8.4  8.4 - 08.6 mg/dL    GFR calc non Af Amer 86 (*) >90 mL/min    GFR calc Af Amer >90  >90 mL/min     No results found.   Assessment/Plan:   71 YO male with paresis of his right thumb and index finger S/P A-stick on 11-24-11 prior to left CEA. Differential includes peripheral nerve  trauma versus tiny embolus S/P L CEA surgery.  As patients deficits  include both flexion and extension of fingers this would incorporate both median and radial nerve.   Recommend: 1) MRI brain--patient will need sedation as he is claustrophobic 2) PT/OT for therapy and splint recommendations 3) If MRI negative patient will need EMG/NCV as out patient 6 weeks after discharge if his symptoms persist.   4) Continue ASA and Plavix  Felicie Morn PA-C [facility] [phone]  11/25/2011, 3:52 PM  I have reviewed this note and seen and examined the patient and made appropriate changes to the note.     Ritta Slot, MD [facility] [phone]

## 2011-11-26 ENCOUNTER — Inpatient Hospital Stay (HOSPITAL_COMMUNITY): Payer: Medicare Other

## 2011-11-26 MED ORDER — FENTANYL CITRATE 0.05 MG/ML IJ SOLN: 25.0000 ug | INTRAMUSCULAR | Status: DC | PRN

## 2011-11-26 MED ORDER — FENTANYL CITRATE 0.05 MG/ML IJ SOLN
INTRAMUSCULAR | Status: AC
  Filled 2011-11-26: qty 2

## 2011-11-26 MED ORDER — IOHEXOL 350 MG/ML SOLN
50.0000 mL | Freq: Once | INTRAVENOUS | Status: AC | PRN
  Administered 2011-11-26: 50 mL via INTRAVENOUS

## 2011-11-26 MED ORDER — MIDAZOLAM HCL 2 MG/2ML IJ SOLN
INTRAMUSCULAR | Status: AC
  Administered 2011-11-26: 2 mg
  Filled 2011-11-26: qty 10

## 2011-11-26 MED ORDER — MIDAZOLAM HCL 2 MG/2ML IJ SOLN
1.0000 mg | INTRAMUSCULAR | Status: DC | PRN
  Administered 2011-11-26: 2 mg via INTRAVENOUS

## 2011-11-26 NOTE — Progress Notes (Addendum)
[facility] Progress Note  11/26/2011 7:15 AM POD 2  Subjective:  Pt states the numbness in his pinky is better and starting to use his thumb more.  Wants to go home.  Tm 99.6 now afebrile Otherwise, VSS Filed Vitals:   11/26/11 0434  BP: 129/54  Pulse: 80  Temp: 98.1 F (36.7 C)  Resp: 22     Physical Exam: Neuro:  In tact.  Hand grips are equal bilaterally. Incision:  C/d/i with steri strips in place.  CBC    Component Value Date/Time   WBC 8.2 11/25/2011 0440   RBC 3.90* 11/25/2011 0440   HGB 12.9* 11/25/2011 0440   HCT 38.2* 11/25/2011 0440   PLT 173 11/25/2011 0440   MCV 97.9 11/25/2011 0440   MCH 33.1 11/25/2011 0440   MCHC 33.8 11/25/2011 0440   RDW 13.0 11/25/2011 0440   LYMPHSABS 1.5 12/22/2010 1650   MONOABS 0.6 12/22/2010 1650   EOSABS 0.3 12/22/2010 1650   BASOSABS 0.0 12/22/2010 1650    BMET    Component Value Date/Time   NA 138 11/25/2011 0440   K 3.6 11/25/2011 0440   CL 103 11/25/2011 0440   CO2 28 11/25/2011 0440   GLUCOSE 108* 11/25/2011 0440   BUN 13 11/25/2011 0440   CREATININE 0.85 11/25/2011 0440   CALCIUM 8.4 11/25/2011 0440   GFRNONAA 86* 11/25/2011 0440   GFRAA >90 11/25/2011 0440     Intake/Output Summary (Last 24 hours) at 11/26/11 0715 Last data filed at 11/26/11 0600  Gross per 24 hour  Intake    770 ml  Output   2575 ml  Net  -1805 ml      Assessment/Plan:  This is a 71 y.o. male who is s/p left CEA POD 2  -pt for MRI of brain this am. -continue plavix and ASA -appreciate [facility] input.  Doreatha Massed, PA-C [facility] [phone]  Agree with above MRI of brain to be done today as well as MRA Plan will depend on findings of MRI scan.  Neurologic exam unchanged with some weakness in right thumb and numbness in fourth and fifth fingers right hand

## 2011-11-26 NOTE — Progress Notes (Signed)
Patient ID: [name], male   DOB: Apr 27, 1940, 72 y.o.   MRN: [id number] CT angiogram of the neck completed and reviewed by me Left carotid endarterectomy site widely patent with no evidence of thrombus at the operative site. Minimal kinking at the distal end of endarterectomy patch which does not appear significant and are not associated with any intraluminal thrombus.  Will observe patient tonight and check labs in a.m. Okay to DC home in a.m. if okay with neurology service-will continue patient on aspirin and Plavix

## 2011-11-26 NOTE — Progress Notes (Cosign Needed)
Patient ID: [name], male   DOB: 1941/01/26, 71 y.o.   MRN: [id number] Pt scheduled for brain MRI today with IV conscious sedation secondary to claustrophobia. 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 Additional hx as below.  EXAM: chest- few scatt insp/exp wheezes, heart- RRR, abd- obese, soft, +BS Filed Vitals:   11/26/11 0040 11/26/11 0434 11/26/11 0800 11/26/11 0821  BP: 145/69 129/54 108/85   Pulse: 77 80    Temp: 98.3 F (36.8 C) 98.1 F (36.7 C) 99 F (37.2 C)   TempSrc: Oral Oral Oral   Resp: 21 22    Height:      Weight:      SpO2: 94% 93%  92%   Past Medical History  Diagnosis Date  . COPD (chronic obstructive pulmonary disease)   . CAD (coronary artery disease)     Ant MI 74  . HTN (hypertension)   . Hyperlipidemia   . Osteoarthritis   . Peripheral vascular disease   . Prostate cancer   . Carotid artery disease     Right CEA 2002 per [name]  . Myocardial infarction   . GERD (gastroesophageal reflux disease)     occ  . CHF (congestive heart failure)    Past Surgical History  Procedure Date  . Coronary stent placement 97, 2000, 2012    8 stents  . Doppler echocardiography 1995    neg  . Carotid endarterectomy 03-2000    right  . Colonoscopy 04-2001    polyp  . Cardiovascular stress test 12-2006  . Cardiac catheterization   . Vascular surgery   . Endarterectomy 11/24/2011    Procedure: ENDARTERECTOMY CAROTID;  Surgeon: Larina Earthly, MD;  Location: [facility] OR;  Service: Vascular;  Laterality: Left;   No results found. Results for orders placed during the hospital encounter of 11/24/11  PROTIME-INR      Component Value Range   Prothrombin Time 13.4  11.6 - 15.2 seconds   INR 1.00  0.00 - 1.49  APTT      Component Value Range   aPTT 35  24 - 37 seconds  CBC      Component Value Range   WBC 8.2  4.0 - 10.5 K/uL   RBC 3.90 (*) 4.22 - 5.81 MIL/uL   Hemoglobin 12.9 (*) 13.0 - 17.0 g/dL     HCT 40.9 (*) 81.1 - 52.0 %   MCV 97.9  78.0 - 100.0 fL   MCH 33.1  26.0 - 34.0 pg   MCHC 33.8  30.0 - 36.0 g/dL   RDW 91.4  78.2 - 95.6 %   Platelets 173  150 - 400 K/uL  BASIC METABOLIC PANEL      Component Value Range   Sodium 138  135 - 145 mEq/L   Potassium 3.6  3.5 - 5.1 mEq/L   Chloride 103  96 - 112 mEq/L   CO2 28  19 - 32 mEq/L   Glucose, Bld 108 (*) 70 - 99 mg/dL   BUN 13  6 - 23 mg/dL   Creatinine, Ser 2.13  0.50 - 1.35 mg/dL   Calcium 8.4  8.4 - 08.6 mg/dL   GFR calc non Af Amer 86 (*) >90 mL/min   GFR calc Af Amer >90  >90 mL/min   A/P: Pt s/p left CEA and right radial arterial line preop, 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

## 2011-11-26 NOTE — Progress Notes (Addendum)
[facility] PROGRESS NOTE    SUBJECTIVE   Patient is very nervous about MRI. He feels his thumb and index finger have improved in strength.  HE had transient decreased sensation in index finger today but that has resolved.   OBJECTIVE   Vital signs in last 24 hours: Temp:  [97.5 F (36.4 C)-99.3 F (37.4 C)] 99 F (37.2 C) (09/06 0800) Pulse Rate:  [73-99] 80  (09/06 0434) Resp:  [16-22] 22  (09/06 0434) BP: (108-145)/(54-85) 108/85 mmHg (09/06 0800) SpO2:  [90 %-96 %] 92 % (09/06 0821)  Intake/Output from previous day: 09/05 0701 - 09/06 0700 In: 770 [P.O.:720; IV Piggyback:50] Out: 2575 [Urine:2575] Intake/Output this shift: Total I/O In: -  Out: 125 [Urine:125] Nutritional status: Cardiac  Past Medical History  Diagnosis Date  . COPD (chronic obstructive pulmonary disease)   . CAD (coronary artery disease)     Ant MI 87  . HTN (hypertension)   . Hyperlipidemia   . Osteoarthritis   . Peripheral vascular disease   . Prostate cancer   . Carotid artery disease     Right CEA 2002 per [name]  . Myocardial infarction   . GERD (gastroesophageal reflux disease)     occ  . CHF (congestive heart failure)     Neurologic ROS negative with exception of above . Musculoskeletal ROS negative with exception of above  Neurologic Exam:  Mental Status: Alert, oriented X 3.  Speech fluent without evidence of aphasia. Able to follow 3 step commands without difficulty. Mood: nervous Memory: intact Thought content appropriate Cranial Nerves: II-Visual fields grossly intact. III/IV/VI-Extraocular movements intact.  Pupils reactive bilaterally. Ptosis not present. V/VII-Smile symmetric VIII-grossly intact IX/X-normal gag XI-bilateral shoulder shrug XII-midline tongue extension Motor: 5/5 bilaterally with normal tone and bulk --right hand shows improvement in thumb flexion and extension 5/5 strength, right index finger  shows 5/5 strength also.  He still has difficulty with controlling fine motor movements of finger but this again is improving.   Sensory: Pinprick and light touch intact throughout, bilaterally Deep Tendon Reflexes:  Right: Upper Extremity   Left: Upper extremity   biceps (C-5 to C-6) 2/4   biceps (C-5 to C-6) 2/4 tricep (C7) 2/4    triceps (C7) 2/4 Brachioradialis (C6) 2/4  Brachioradialis (C6) 2/4  Lower Extremity Lower Extremity  quadriceps (L-2 to L-4) 2/4   quadriceps (L-2 to L-4) 2/4 Achilles (S1) 2/4   Achilles (S1) 2/4      Plantars:      Right:  downgoing     Left:  Downgoing Cerebellar: Normal finger-to-nose, normal rapid alternating movements and normal heel-to-shin test.   Gait: Normal gait and station.   Lab Results: Lab Results  Component Value Date/Time   CHOL 143 04/22/2011  9:01 AM   Lipid Panel No results found for this basename: CHOL,TRIG,HDL,CHOLHDL,VLDL,LDLCALC in the last 72 hours  Studies/Results: No results found.  Medications:     Scheduled:   . aspirin EC  325 mg Oral Daily  . atorvastatin  10 mg Oral q1800  . clopidogrel  75 mg Oral Daily  . docusate sodium  100 mg Oral Daily  . Fluticasone-Salmeterol  1 puff Inhalation Q12H  . losartan  50 mg Oral Daily  . metoprolol tartrate  12.5 mg  Oral BID  . midazolam  1 mg Intravenous Once  . nicotine  14 mg Transdermal Daily  . Tamsulosin HCl  0.4 mg Oral QPC supper  . tiotropium  18 mcg Inhalation Daily    Assessment/Plan:  71 YO male with paresis of his right thumb and index finger S/P A-stick on 11-24-11 prior to left CEA. Differential includes peripheral nerve trauma versus tiny embolus S/P L CEA surgery. As patients deficits include both flexion and extension of fingers this would incorporate both median and radial nerve.  Recommend:  1) MRI brain--patient will need sedation as he is claustrophobic  2) PT/OT for therapy and splint recommendations  3) If MRI negative patient will need EMG/NCV as  out patient 6 weeks after discharge if his symptoms persist.  4) Continue ASA and Plavix    Felicie Morn PA-C [facility] [phone]  11/26/2011, 9:17 AM

## 2011-11-26 NOTE — Evaluation (Signed)
Physical Therapy Evaluation Patient Details Name: [name] MRN: [id number] DOB: 21-Apr-1940 Today's Date: 11/26/2011 Time: 4098-1191 PT Time Calculation (min): 26 min  PT Assessment / Plan / Recommendation Clinical Impression  On admission pt determined to have suffered scattered infarcts.  Pt is at or approaching baseline function except for the R hand/thumb.  OT has given exercises for this.  Balance is safe with very low risk of falls.  D/C from PT    PT Assessment  Patent does not need any further PT services    Follow Up Recommendations  No PT follow up    Barriers to Discharge        Equipment Recommendations  None recommended by PT    Recommendations for Other Services     Frequency      Precautions / Restrictions     Pertinent Vitals/Pain       Mobility  Bed Mobility Bed Mobility: Supine to Sit;Sitting - Scoot to Edge of Bed Supine to Sit: 7: Independent Sitting - Scoot to [place] of Bed: 7: Independent Transfers Transfers: Sit to Stand;Stand to [employer] Transfers Sit to Stand: 7: Independent Stand to Sit: 7: Independent Stand Pivot Transfers: 7: Independent Ambulation/Gait Ambulation/Gait Assistance: 7: Independent Ambulation Distance (Feet): 350 Feet Assistive device: None Gait Pattern: Within Functional Limits Stairs: Yes Stairs Assistance: 6: Modified independent (Device/Increase time) Stair Management Technique: One rail Right;Alternating pattern;Backwards Number of Stairs: 10  Wheelchair Mobility Wheelchair Mobility: No Modified Rankin (Stroke Patients Only) Pre-Morbid Rankin Score: No symptoms Modified Rankin: No significant disability    Exercises     PT Diagnosis:    PT Problem List:   PT Treatment Interventions:     PT Goals    Visit Information  Last PT Received On: 11/26/11 Assistance Needed: +1    Subjective Data  Subjective: I do have some numbness in my R hand, but that's about all Patient Stated Goal: I, back to my  job   Prior Functioning  Home Living Lives With: Alone Available Help at Discharge: Family Type of Home: House Prior Function Level of Independence: Independent Driving: Yes Vocation: Part time employment Communication Communication: No difficulties    Cognition  Overall Cognitive Status: Appears within functional limits for tasks assessed/performed Arousal/Alertness: Awake/alert Orientation Level: Oriented X4 / Intact Behavior During Session: WFL for tasks performed    Extremity/Trunk Assessment Right Lower Extremity Assessment RLE ROM/Strength/Tone: Within functional levels RLE Sensation: WFL - Light Touch RLE Coordination: WFL - gross/fine motor Left Lower Extremity Assessment LLE ROM/Strength/Tone: Within functional levels LLE Sensation: WFL - Light Touch LLE Coordination: WFL - gross/fine motor Trunk Assessment Trunk Assessment: Normal   Balance Balance Balance Assessed: Yes Standardized Balance Assessment Standardized Balance Assessment: Berg Balance Test;Dynamic Gait Index Berg Balance Test Sit to Stand: Able to stand without using hands and stabilize independently Standing Unsupported: Able to stand safely 2 minutes Sitting with Back Unsupported but Feet Supported on Floor or Stool: Able to sit safely and securely 2 minutes Stand to Sit: Sits safely with minimal use of hands Transfers: Able to transfer safely, minor use of hands Standing Unsupported with Eyes Closed: Able to stand 10 seconds safely Standing Ubsupported with Feet Together: Able to place feet together independently and stand 1 minute safely From Standing, Reach Forward with Outstretched Arm: Can reach confidently >25 cm (10") From Standing Position, Pick up Object from Floor: Able to pick up shoe safely and easily From Standing Position, Turn to Look Behind Over each Shoulder: Looks behind  from both sides and weight shifts well Turn 360 Degrees: Able to turn 360 degrees safely in 4 seconds or  less Standing Unsupported, Alternately Place Feet on Step/Stool: Able to stand independently and safely and complete 8 steps in 20 seconds Standing Unsupported, One Foot in Front: Able to plae foot ahead of the other independently and hold 30 seconds Standing on One Leg: Able to lift leg independently and hold equal to or more than 3 seconds Total Score: 53  Dynamic Gait Index Level Surface: Normal Change in Gait Speed: Normal Gait with Horizontal Head Turns: Normal Gait with Vertical Head Turns: Normal Gait and Pivot Turn: Normal Step Over Obstacle: Mild Impairment Step Around Obstacles: Mild Impairment Steps: Mild Impairment Total Score: 21   End of Session PT - End of Session Activity Tolerance: Patient tolerated treatment well Patient left: in chair;with call bell/phone within reach Nurse Communication: Mobility status  GP     Deysi Soldo, Eliseo Gum 11/26/2011, 4:02 PM  11/26/2011  Williamson Bing, PT [phone] (pager)

## 2011-11-26 NOTE — Evaluation (Signed)
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 Ot to sign off. Pt with Rt hand (Thumb) deficits noted    OT Assessment  Patient does not need any further OT services    Follow Up Recommendations  No OT follow up    Barriers to Discharge      Equipment Recommendations  None recommended by OT    Recommendations for Other Services    Frequency       Precautions / Restrictions Precautions Precautions: None   Pertinent Vitals/Pain none    ADL  ADL Comments: Pt with decreased Rt thumb AROM.  (unable to complete circumduction, decr flexion of thumb,) Pt provided fine motor handouts and items to complete fine motor. Pt with 4+ out 5 grip strength    OT Diagnosis:    OT Problem List:   OT Treatment Interventions:     OT Goals    Visit Information  Last OT Received On: 11/26/11 Assistance Needed: +1    Subjective Data  Subjective: "that thumb hasnt worked well since they tried to put that line in" - Pt reports after x4 attempts of put in Aline Patient Stated Goal: to go home today   Prior Functioning  Vision/Perception  Home Living Lives With: Alone Available Help at Discharge: Family Type of Home: House Prior Function Level of Independence: Independent Driving: Yes Vocation: Part time employment Communication Communication: No difficulties Dominant Hand: Right      Cognition  Overall Cognitive Status: Appears within functional limits for tasks assessed/performed Arousal/Alertness: Awake/alert Orientation Level: Oriented X4 / Intact Behavior During Session: [facility] for tasks performed    Extremity/Trunk Assessment Right Upper Extremity Assessment RUE ROM/Strength/Tone: Within  functional levels RUE Sensation: Deficits (reports finger tip of 5th 4th and thumb with numbness only) RUE Coordination: WFL - gross motor Left Upper Extremity Assessment LUE ROM/Strength/Tone: Within functional levels LUE Sensation: WFL - Light Touch LUE Coordination: WFL - gross motor Right Lower Extremity Assessment RLE ROM/Strength/Tone: Within functional levels RLE Sensation: WFL - Light Touch RLE Coordination: WFL - gross/fine motor Left Lower Extremity Assessment LLE ROM/Strength/Tone: Within functional levels LLE Sensation: WFL - Light Touch LLE Coordination: WFL - gross/fine motor Trunk Assessment Trunk Assessment: Normal   Mobility  Shoulder Instructions  Bed Mobility Bed Mobility: Supine to Sit;Sitting - Scoot to Edge of Bed Supine to Sit: 7: Independent Sitting - Scoot to [place] of Bed: 7: Independent Transfers Sit to Stand: 7: Independent Stand to Sit: 7: Independent       Exercise     Balance Balance Balance Assessed: Yes Standardized Balance Assessment Standardized Balance Assessment: Berg Balance Test;Dynamic Gait Index Berg Balance Test Sit to Stand: Able to stand without using hands and stabilize independently Standing Unsupported: Able to stand safely 2 minutes Sitting with Back Unsupported but Feet Supported on Floor or Stool: Able to sit safely and securely 2 minutes Stand to Sit: Sits safely with minimal use of hands Transfers: Able to transfer safely, minor use of hands Standing Unsupported with Eyes Closed: Able to stand 10 seconds safely Standing Ubsupported with Feet Together: Able to place feet together independently and stand 1 minute safely From Standing, Reach Forward with Outstretched Arm: Can reach confidently >25 cm (10") From Standing Position, Pick up Object  from Floor: Able to pick up shoe safely and easily From Standing Position, Turn to Look Behind Over each Shoulder: Looks behind from both sides and weight shifts well Turn 360  Degrees: Able to turn 360 degrees safely in 4 seconds or less Standing Unsupported, Alternately Place Feet on Step/Stool: Able to stand independently and safely and complete 8 steps in 20 seconds Standing Unsupported, One Foot in Front: Able to plae foot ahead of the other independently and hold 30 seconds Standing on One Leg: Able to lift leg independently and hold equal to or more than 3 seconds Total Score: 53  Dynamic Gait Index Level Surface: Normal Change in Gait Speed: Normal Gait with Horizontal Head Turns: Normal Gait with Vertical Head Turns: Normal Gait and Pivot Turn: Normal Step Over Obstacle: Mild Impairment Step Around Obstacles: Mild Impairment Steps: Mild Impairment Total Score: 21    End of Session OT - End of Session Activity Tolerance: Patient tolerated treatment well Patient left: Other (comment) (with PT)  GO     Harrel Carina [facility] 11/26/2011, 4:13 PM Pager: [phone]

## 2011-11-26 NOTE — Progress Notes (Signed)
Patient ID: [name], male   DOB: 10-08-1940, 71 y.o.   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 MRA was also benign which revealed excellent flow in the distal internal carotid artery on the left and right but did not visualize carotid endarterectomy site in left neck Patient required conscious sedation for study because of claustrophobia Discussed these findings with the patient and daughter and explained need for CT angiogram of neck to look at left carotid endarterectomy site to be certain there is no source of emboli at surgical site.  I reviewed the above study with Dr. Elie Goody in radiology and they will expedite performing CTA of neck at this time. Assuming surgical site looks good will hope to DC patient in a.m. if neurology in agreement. Neuro exam at this time remains unchanged with patient alert and oriented x3 with no significant focal weakness other than right and complaining of some numbness and right fourth and fifth fingers-no discoordination or other neuro deficits

## 2011-11-26 NOTE — Progress Notes (Signed)
Proformed RN bedside swallow screen with sips of water and cracker taste, patientt had no trouble with either and lung sounds did not change.

## 2011-11-26 NOTE — Progress Notes (Addendum)
MRI reviewed and explained to family and patient there is scattered acute infarcts in the left hemisphere without mass  effect or hemorrhage.   Would recommend  1) Continuation of Plavix for stroke preventions. May continue ASA with Plavix if indicated secondary to Cardiac disease. Would allow BP to trend on the high side for next three days but keep systolic <210.  2) Will order Q2V, FLP and bedside swallow to finish stroke core measures.  3) Recommend PT and OT to evaluate prior to discharge.   Felicie Morn PA-C [facility] [phone]  11/26/2011, 2:05 PM

## 2011-11-27 DIAGNOSIS — I779 Disorder of arteries and arterioles, unspecified: Secondary | ICD-10-CM

## 2011-11-27 DIAGNOSIS — I2510 Atherosclerotic heart disease of native coronary artery without angina pectoris: Secondary | ICD-10-CM

## 2011-11-27 LAB — HEMOGLOBIN A1C
Hgb A1c MFr Bld: 5.9 % — ABNORMAL HIGH (ref <5.7)
Mean Plasma Glucose: 123 mg/dL — ABNORMAL HIGH (ref <117)

## 2011-11-27 LAB — BASIC METABOLIC PANEL
BUN: 10 mg/dL (ref 6–23)
GFR calc Af Amer: >90 mL/min (ref >90)
GFR calc non Af Amer: >90 mL/min (ref >90)
Potassium: 3.4 mEq/L — ABNORMAL LOW (ref 3.5–5.1)

## 2011-11-27 LAB — LIPID PANEL
Cholesterol: 141 mg/dL (ref 0–200)
HDL: 51 mg/dL (ref >39)

## 2011-11-27 MED ORDER — OXYCODONE HCL 5 MG PO TABS: 5.0000 mg | ORAL_TABLET | Freq: Four times a day (QID) | ORAL | Status: AC | PRN

## 2011-11-27 MED ORDER — CLOPIDOGREL BISULFATE 75 MG PO TABS: 75.0000 mg | ORAL_TABLET | Freq: Every day | ORAL | Status: DC

## 2011-11-27 MED ORDER — NICOTINE 14 MG/24HR TD PT24: 1.0000 | MEDICATED_PATCH | Freq: Every day | TRANSDERMAL | Status: AC

## 2011-11-27 NOTE — Progress Notes (Signed)
Stroke Team Progress Note  HISTORY [name] is an 71 y.o. male Who underwent a CEA 11/24/11. There was some difficulty with his aline stick on that side. Since then he states he cannot extend his right thumb or index finger nor can he flex his thumb/index finger. He denies any sensory changes. He also denies any right arm weakness in abduction, bicep flexion, tricep extension, wrist flexion or extension. Family members state he has some improvement of movement today.  SUBJECTIVE Family is at bedside. The patient feels as if the right hand weakness is improving. The right hand numbness was present before surgery, the right thumb weakness started after surgery.  OBJECTIVE Most recent Vital Signs: Temp: 98.2 F (36.8 C) (09/07 0721) Temp src: Oral (09/07 0721) BP: 132/64 mmHg (09/07 0721) Pulse Rate: 81  (09/07 0721) Respiratory Rate: 20 O2 Saturation: 94%  CBG (last 3) No results found for this basename: GLUCAP:3 in the last 72 hours Intake/Output from previous day: 09/06 0701 - 09/07 0700 In: 240 [P.O.:240] Out: 1400 [Urine:1400]  IV Fluid Intake:     . sodium chloride 100 mL/hr at 11/25/11 0500  . DOPamine     Medications    . aspirin EC  325 mg Oral Daily  . atorvastatin  10 mg Oral q1800  . clopidogrel  75 mg Oral Daily  . docusate sodium  100 mg Oral Daily  . Fluticasone-Salmeterol  1 puff Inhalation Q12H  . losartan  50 mg Oral Daily  . metoprolol tartrate  12.5 mg Oral BID  . midazolam      . nicotine  14 mg Transdermal Daily  . Tamsulosin HCl  0.4 mg Oral QPC supper  . tiotropium  18 mcg Inhalation Daily  PRN acetaminophen, acetaminophen, albuterol, ALPRAZolam, alum & mag hydroxide-simeth, diphenhydrAMINE, DOPamine, guaiFENesin-dextromethorphan, hydrALAZINE, iohexol, labetalol, metoprolol, morphine injection, nitroGLYCERIN, ondansetron, oxyCODONE-acetaminophen, phenol, DISCONTD: fentaNYL, DISCONTD: midazolam  Diet:  Cardiac thin liquids Activity:  Up with  assistance DVT Prophylaxis:    Significant Diagnostic Studies: CBC    Component Value Date/Time   WBC 8.2 11/25/2011 0440   RBC 3.90* 11/25/2011 0440   HGB 12.9* 11/25/2011 0440   HCT 38.2* 11/25/2011 0440   PLT 173 11/25/2011 0440   MCV 97.9 11/25/2011 0440   MCH 33.1 11/25/2011 0440   MCHC 33.8 11/25/2011 0440   RDW 13.0 11/25/2011 0440   LYMPHSABS 1.5 12/22/2010 1650   MONOABS 0.6 12/22/2010 1650   EOSABS 0.3 12/22/2010 1650   BASOSABS 0.0 12/22/2010 1650   CMP    Component Value Date/Time   NA 138 11/27/2011 0400   K 3.4* 11/27/2011 0400   CL 101 11/27/2011 0400   CO2 25 11/27/2011 0400   GLUCOSE 109* 11/27/2011 0400   BUN 10 11/27/2011 0400   CREATININE 0.72 11/27/2011 0400   CALCIUM 9.0 11/27/2011 0400   PROT 7.4 11/17/2011 0825   ALBUMIN 4.2 11/17/2011 0825   AST 15 11/17/2011 0825   ALT 11 11/17/2011 0825   ALKPHOS 43 11/17/2011 0825   BILITOT 0.4 11/17/2011 0825   GFRNONAA >90 11/27/2011 0400   GFRAA >90 11/27/2011 0400   COAGS Lab Results  Component Value Date   INR 1.00 11/24/2011   INR 1.02 11/17/2011   INR 1.02 12/23/2010   Lipid Panel    Component Value Date/Time   CHOL 141 11/27/2011 0400   TRIG 59 11/27/2011 0400   HDL 51 11/27/2011 0400   CHOLHDL 2.8 11/27/2011 0400   VLDL 12 11/27/2011 0400  LDLCALC 78 11/27/2011 0400   HgbA1C  Lab Results  Component Value Date   HGBA1C 5.9* 11/26/2011   Urine Drug Screen  No results found for this basename: labopia, cocainscrnur, labbenz, amphetmu, thcu, labbarb    Alcohol Level No results found for this basename: eth     Results for orders placed during the hospital encounter of 11/24/11 (from the past 24 hour(s))  HEMOGLOBIN A1C     Status: Abnormal   Collection Time   11/26/11  5:00 PM      Component Value Range   Hemoglobin A1C 5.9 (*) <5.7 %   Mean Plasma Glucose 123 (*) <117 mg/dL  BASIC METABOLIC PANEL     Status: Abnormal   Collection Time   11/27/11  4:00 AM      Component Value Range   Sodium 138  135 - 145 mEq/L   Potassium 3.4 (*) 3.5 -  5.1 mEq/L   Chloride 101  96 - 112 mEq/L   CO2 25  19 - 32 mEq/L   Glucose, Bld 109 (*) 70 - 99 mg/dL   BUN 10  6 - 23 mg/dL   Creatinine, Ser 8.75  0.50 - 1.35 mg/dL   Calcium 9.0  8.4 - 64.3 mg/dL   GFR calc non Af Amer >90  >90 mL/min   GFR calc Af Amer >90  >90 mL/min  LIPID PANEL     Status: Normal   Collection Time   11/27/11  4:00 AM      Component Value Range   Cholesterol 141  0 - 200 mg/dL   Triglycerides 59  <329 mg/dL   HDL 51  >51 mg/dL   Total CHOL/HDL Ratio 2.8     VLDL 12  0 - 40 mg/dL   LDL Cholesterol 78  0 - 99 mg/dL    Ct Angio Neck W/cm &/or Wo/cm  11/26/2011  *RADIOLOGY REPORT*  Clinical Data:  71 year old male status post left carotid endarterectomy with left brain stroke.  CT ANGIOGRAPHY NECK  Technique:  Multidetector CT imaging of the neck was performed using the standard protocol during bolus administration of intravenous contrast.  Multiplanar CT image reconstructions including MIPs were obtained to evaluate the vascular anatomy. Carotid stenosis measurements (when applicable) are obtained utilizing NASCET criteria, using the distal internal carotid diameter as the denominator.  Contrast: 50mL OMNIPAQUE IOHEXOL 350 MG/ML SOLN  Comparison:  Brain MRI and intracranial MRA from earlier the same day.  Findings:  Scarring and paraseptal emphysema at the lung apices. Negative superior mediastinum with no lymphadenopathy.  Small calcified right thyroid nodule of doubtful significance.  Postoperative changes in the left neck including a 14 mm focus of postoperative gas in the left submandibular space.  Surrounding stranding and trace fluid tracking along the surgical bed.  Small volume retropharyngeal effusion is associated.  See vascular findings below.  Negative sublingual space and parapharyngeal spaces.  Parotid glands and right submandibular gland are within normal limits.  The left submandibular gland is bordered by postoperative inflammation but otherwise within normal  limits.  No cervical lymphadenopathy. Visualized orbit soft tissues are within normal limits.  Minor paranasal sinus mucosal thickening. No acute osseous abnormality identified.  Vascular Findings: Three-vessel arch configuration with arch atherosclerosis.  No right common carotid artery origin stenosis.  Normal right common carotid artery.  The right carotid bifurcation there is a soft and calcified plaque, but the right ICA origin is widely patent.  The patient may have previously had a right carotid  endarterectomy also given the somewhat patulous appearance.  Soft plaque in the posterior right ICA bulb.  Cervical right ICA otherwise within normal limits.  Calcified right ICA siphon atherosclerosis partially visible.  Calcified plaque obscures the right vertebral artery origin.  The cervical right vertebral artery is otherwise patent and within normal limits.  Calcified plaque in the proximal left vertebral artery which is also tortuous with a kinked appearance.  The left vertebral artery is patent throughout the neck.  There is calcified plaque at the C1 level resulting in less than 50% stenosis.  Plaque at the left common carotid artery origin resulting in less than 50 % stenosis with respect to the distal vessel.  Left common carotid artery is patent.  Postoperative changes began at the distal left CCA.  The left carotid bifurcation is widely patent. The left ICA origin is widely patent.  There is tortuosity of the left ICA which results in a kinked appearance at the C2-C3 level (coronal image 87). There is no thrombus or convincing left ICA dissection identified. There is mild calcified plaque in the distal left ICA just below the skull base resulting in less than 50 % with respect to the distal vessel.  Calcified plaque partially visible on the left ICA siphon.   Review of the MIP images confirms the above findings.  IMPRESSION: 1.  Tortuous cervical left ICA status post carotid endarterectomy with a kinked  appearance at the C2-C3 level, but otherwise no filling defect, stenosis, or evidence of dissection. 2.  Evidence of previous right carotid endarterectomy.  Mild soft plaque in the right ICA bulb without stenosis. 3.  Evidence of atherosclerotic stenosis at both vertebral artery origins, but otherwise negative cervical vertebral arteries. 4.  Expected soft tissue postoperative changes to the left neck. Small retropharyngeal effusion.   Original Report Authenticated By: Harley Hallmark, M.D.    Mr Maxine Glenn Head Wo Contrast  11/26/2011  *RADIOLOGY REPORT*  Clinical Data:  71 year old male right hand weakness status post left carotid endarterectomy.  Comparison: None.  MRI HEAD WITHOUT CONTRAST  Technique: Multiplanar, multiecho pulse sequences of the brain and surrounding structures were obtained according to standard protocol without intravenous contrast.  Findings: Diffusion weighted imaging reveals scattered left hemisphere mostly cortical areas of restricted diffusion. Scattered involvement from the left occipital lobe to the left superior parietal and frontal gyri.  PeriRolandic involvement is most confluent including in the area of right upper extremity representation.  No posterior fossa or right hemisphere diffusion abnormality. Major intracranial vascular flow voids are preserved.  MRA findings below.  Early T2 and FLAIR hyperintensity at the affected areas. No associated mass effect or hemorrhage.  Wallace Cullens and white matter signal elsewhere within normal limits for age.  No ventriculomegaly.  Partially empty sella.  Negative visualized cervical spine and cervicomedullary junction.  No extra-axial collection.  Visualized orbit soft tissues are within normal limits.  Scattered paranasal sinus mucosal thickening.  Negative scalp soft tissues. Minor fluid signal on the right mastoids. Visualized bone marrow signal is within normal limits.  IMPRESSION: 1.  Scattered acute infarcts in the left hemisphere without mass  effect or hemorrhage.  Involvement most confluent in the left periRolandic cortex. 2.  Intracranial MRA findings below.  MRA HEAD WITHOUT CONTRAST  Technique: Angiographic images of the Circle of Willis were obtained using MRA technique without  intravenous contrast.  Findings: Antegrade flow in the posterior circulation.  Mildly dominant distal left vertebral artery.  Left PICA is normal. Vertebrobasilar junction is  normal.  Dominant right AICA.  No basilar stenosis.  SCA and PCA origins are within normal limits. Bilateral PCA branches are within normal limits, including the left.  Posterior communicating artery is present on the left, diminutive or absent on the right.  Antegrade flow in both ICA siphons which appears symmetric in size and signal. There is irregularity, but No ICA siphon stenosis. Ophthalmic and left posterior communicating artery origins are within normal limits.  The carotid termini are patent.  MCA and ACA origins are within normal limits.  Fenestrated but otherwise normal anterior communicating artery.  Visualized ACA branches and right MCA branches are within normal limits.  The left MCA M1 segment is patent.  Left MCA M2 branches appear patent and within normal limits.  No major branch occlusion or stenosis identified.  IMPRESSION:  Evidence of ICA siphon atherosclerosis but no intracranial stenosis or major branch occlusion.  There is a normal appearing left posterior communicating artery.  Salient findings reviewed in person with vascular surgery by Dr. Elie Goody at approximately 1315 hours on 11/26/2011.   Original Report Authenticated By: Harley Hallmark, M.D.    Mr Brain Wo Contrast  11/26/2011  *RADIOLOGY REPORT*  Clinical Data:  71 year old male right hand weakness status post left carotid endarterectomy.  Comparison: None.  MRI HEAD WITHOUT CONTRAST  Technique: Multiplanar, multiecho pulse sequences of the brain and surrounding structures were obtained according to standard  protocol without intravenous contrast.  Findings: Diffusion weighted imaging reveals scattered left hemisphere mostly cortical areas of restricted diffusion. Scattered involvement from the left occipital lobe to the left superior parietal and frontal gyri.  PeriRolandic involvement is most confluent including in the area of right upper extremity representation.  No posterior fossa or right hemisphere diffusion abnormality. Major intracranial vascular flow voids are preserved.  MRA findings below.  Early T2 and FLAIR hyperintensity at the affected areas. No associated mass effect or hemorrhage.  Wallace Cullens and white matter signal elsewhere within normal limits for age.  No ventriculomegaly.  Partially empty sella.  Negative visualized cervical spine and cervicomedullary junction.  No extra-axial collection.  Visualized orbit soft tissues are within normal limits.  Scattered paranasal sinus mucosal thickening.  Negative scalp soft tissues. Minor fluid signal on the right mastoids. Visualized bone marrow signal is within normal limits.  IMPRESSION: 1.  Scattered acute infarcts in the left hemisphere without mass effect or hemorrhage.  Involvement most confluent in the left periRolandic cortex. 2.  Intracranial MRA findings below.  MRA HEAD WITHOUT CONTRAST  Technique: Angiographic images of the Circle of Willis were obtained using MRA technique without  intravenous contrast.  Findings: Antegrade flow in the posterior circulation.  Mildly dominant distal left vertebral artery.  Left PICA is normal. Vertebrobasilar junction is normal.  Dominant right AICA.  No basilar stenosis.  SCA and PCA origins are within normal limits. Bilateral PCA branches are within normal limits, including the left.  Posterior communicating artery is present on the left, diminutive or absent on the right.  Antegrade flow in both ICA siphons which appears symmetric in size and signal. There is irregularity, but No ICA siphon stenosis. Ophthalmic and  left posterior communicating artery origins are within normal limits.  The carotid termini are patent.  MCA and ACA origins are within normal limits.  Fenestrated but otherwise normal anterior communicating artery.  Visualized ACA branches and right MCA branches are within normal limits.  The left MCA M1 segment is patent.  Left MCA M2 branches appear patent  and within normal limits.  No major branch occlusion or stenosis identified.  IMPRESSION:  Evidence of ICA siphon atherosclerosis but no intracranial stenosis or major branch occlusion.  There is a normal appearing left posterior communicating artery.  Salient findings reviewed in person with vascular surgery by Dr. Elie Goody at approximately 1315 hours on 11/26/2011.   Original Report Authenticated By: Harley Hallmark, M.D.     CT of the brain  Not done  CT angio   IMPRESSION:  1. Tortuous cervical left ICA status post carotid endarterectomy  with a kinked appearance at the C2-C3 level, but otherwise no  filling defect, stenosis, or evidence of dissection.  2. Evidence of previous right carotid endarterectomy. Mild soft  plaque in the right ICA bulb without stenosis.  3. Evidence of atherosclerotic stenosis at both vertebral artery  origins, but otherwise negative cervical vertebral arteries.  4. Expected soft tissue postoperative changes to the left neck.  Small retropharyngeal effusion.   MRI of the brain    IMPRESSION:  Evidence of ICA siphon atherosclerosis but no intracranial  stenosis or major branch occlusion. There is a normal appearing  left posterior communicating artery.  MRA of the brain    IMPRESSION:  Evidence of ICA siphon atherosclerosis but no intracranial  stenosis or major branch occlusion. There is a normal appearing  left posterior communicating artery.  2D Echocardiogram  Not ordered  Carotid Doppler  Not ordered  CXR  Not ordered  EKG    Physical Exam   The patient is alert and cooperative. Fully  oriented.  Neurologic exam reveals full extraocular movements, speech is normal. Visual fields are full.  Motor testing reveals good strength of all four extremities, with the exception that there is mild weakness of the right hand and thumb.  The patient has good finger-nose-finger and heel-to-shin bilaterally. Gait was normal. Romberg negative, no drift.  Deep tendon reflexes are symmetric and normal. Toes are down going bilaterally.    ASSESSMENT [name] is a 71 y.o. 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 On aspirin 325 mg orally every day and clopidogrel 75 mg orally every day for secondary stroke prevention.  Stroke risk factors:  carotid stenosis, hyperlipidemia and hypertension  Hospital day # 3  TREATMENT/PLAN Continue aspirin 325 mg orally every day and clopidogrel 75 mg orally every day for secondary stroke prevention.   The patient likely had an artery to artery embolus, with multiple small left middle cerebral artery strokes. The patient currently has had his left carotid endarterectomy, and he is doing well with minimal deficits. The patient will remain on aspirin and Plavix.  The patient is neurologically cleared for discharge today.   Lesly Dukes

## 2011-11-27 NOTE — Progress Notes (Addendum)
[facility] Progress Note  11/27/2011 8:41 AM POD 3  Subjective:  "I'm ready to go home".  States his thumb is a little better this am.  Tm 99 now afebrile Otherwise, VSS 94%RA Filed Vitals:   11/27/11 0721  BP: 132/64  Pulse: 81  Temp: 98.2 F (36.8 C)  Resp: 20     Physical Exam: Neuro:  In tact; hand grips are equal bilaterally Incision:  C/d/i with steri strips in place.  CBC    Component Value Date/Time   WBC 8.2 11/25/2011 0440   RBC 3.90* 11/25/2011 0440   HGB 12.9* 11/25/2011 0440   HCT 38.2* 11/25/2011 0440   PLT 173 11/25/2011 0440   MCV 97.9 11/25/2011 0440   MCH 33.1 11/25/2011 0440   MCHC 33.8 11/25/2011 0440   RDW 13.0 11/25/2011 0440   LYMPHSABS 1.5 12/22/2010 1650   MONOABS 0.6 12/22/2010 1650   EOSABS 0.3 12/22/2010 1650   BASOSABS 0.0 12/22/2010 1650    BMET    Component Value Date/Time   NA 138 11/27/2011 0400   K 3.4* 11/27/2011 0400   CL 101 11/27/2011 0400   CO2 25 11/27/2011 0400   GLUCOSE 109* 11/27/2011 0400   BUN 10 11/27/2011 0400   CREATININE 0.72 11/27/2011 0400   CALCIUM 9.0 11/27/2011 0400   GFRNONAA >90 11/27/2011 0400   GFRAA >90 11/27/2011 0400       Intake/Output Summary (Last 24 hours) at 11/27/11 0841 Last data filed at 11/27/11 0800  Gross per 24 hour  Intake    480 ml  Output   1475 ml  Net   -995 ml   CTA neck reviewed by Dr. Hart Rochester and is as follows: Left carotid endarterectomy site widely patent with no evidence of thrombus at the operative site. Minimal kinking at the distal end of endarterectomy patch which does not appear significant and are not associated with any intraluminal thrombus.    Assessment/Plan:  This is a 71 y.o. male who is s/p left CEA POD 3  -pt is doing well this am. -CEA site is patent without evidence of thrombus. -OT/PT evaluated pt and do not recommend any f/u -pt passed swallow eval by RN -will d/c home on Plavix and ASA later this am after Dr. Hart Rochester and neuro have seen pt has seen the pt -f/u  with neuro per neuro team.  Doreatha Massed, PA-C [facility] [phone]  Agree with above No new neurologic symptoms Very subtle neurologic changes in right hand with some weakness of thumb and some numbness fourth fifth fingers Alert and oriented x3 with no other symptoms  Plan DC home return to see Dr. early in 2 weeks on aspirin and Plavix

## 2011-11-27 NOTE — Progress Notes (Signed)
Pt discharged home with daughter per MD order. Discharge instructions reviewed and all questions answered.

## 2011-11-27 NOTE — Plan of Care (Signed)
Problem: Consults Goal: Diagnosis CEA/CES/AAA Stent Carotid Endarterectomy (CEA)     

## 2011-12-02 ENCOUNTER — Telehealth: Payer: Self-pay

## 2011-12-02 NOTE — Telephone Encounter (Signed)
Triage Record Num: [id number] Operator: Laren Boom Patient Name: [name] Call Date & Time: 12/01/2011 5:24:58PM Patient Phone: [phone] PCP: Audrie Gallus. [place] Patient Gender: Male PCP Fax : Patient DOB: 06/16/1940 Practice Name: [facility] Reason for Call: Caller: Travian/Patient; PCP: [name] [facility]); CB#: [phone]. 12/01/11 - on Monday (11/29/11) he but the inside of the cheek because of the swelling from his Carotid Surgery on 11/24/11. Has had some bleeding off an on, esp when he puts in his dentures. Afebrile. Currently taking Blood Thinners. No Post-Op Problems. All Emergent Signs/Symptoms of Mouth Injury Protocol ruled out except "all other situations" (disposition - home care). Home Care Advice Given. Recommended patient see a dentist - call office in morning for referral since he currently does not have one. Call Back Parameters Given. Protocol(s) Used: Mouth Injury Recommended Outcome per Protocol: Provide Home/Self Care Reason for Outcome: All other situations Care Advice: ~ Cold liquids or ice will help control bleeding. ~ Call provider if symptoms continue, worsen, or new symptoms develop. Apply cloth-covered ice pack or a cool compress to the area for no more than 20 minutes 4-8 times a day while awake to reduce pain and swelling. ~ ~ Avoid salty, spicy, acidic or carbonated drinks or foods. Rinse mouth with a salt solution (1/2 tsp. salt in 8 ounces [.2 liter] of water) or an antiseptic mouthwash after eating and before bedtime. ~

## 2011-12-06 ENCOUNTER — Encounter: Payer: Self-pay | Admitting: Vascular Surgery

## 2011-12-07 ENCOUNTER — Ambulatory Visit (INDEPENDENT_AMBULATORY_CARE_PROVIDER_SITE_OTHER): Payer: Medicare Other | Admitting: Vascular Surgery

## 2011-12-07 ENCOUNTER — Other Ambulatory Visit: Payer: Self-pay | Admitting: *Deleted

## 2011-12-07 ENCOUNTER — Encounter: Payer: Self-pay | Admitting: Vascular Surgery

## 2011-12-07 VITALS — BP 137/70 | HR 77 | Resp 18 | Ht 70.0 in | Wt 191.1 lb

## 2011-12-07 DIAGNOSIS — I6529 Occlusion and stenosis of unspecified carotid artery: Secondary | ICD-10-CM

## 2011-12-07 NOTE — Progress Notes (Signed)
The patient presents today for followup of his left carotid endarterectomy on 11/24/2011. He has severe asymptomatic left internal carotid artery stenosis. He had similar treatment of the severe right carotid stenosis several years earlier. He is endarterectomy was routine. He did have a more friable plaque than typical but did have a normal internal carotid above and common carotid artery below the endarterectomy. He also had a great deal of difficulty placing a radial arterial line with multiple punctures in both right and left wrist prior to surgery. He reported an episode where he had severe pain associated with the puncture on the right. In the recovery room. Patient was found to have some cramping in his right hand. He had no focal deficit and no motor deficit. He continues to have some tingling on postoperative day #1 and this prompted an MRI which showed multiple small areas of probable embolic event. He had no deficit aside from the tingling numbness in his right hand. He did have some weakness in his thumb. He was discharged home and this continued to recover.  Portion today he is returned to his baseline. He reports some mild tingling in the tip of his fifth finger. Otherwise he feels that he is completely back to normal. His neck and sealing incision is healing quite nicely. Otherwise he is intact neurologically. He has no carotid bruits bilaterally  Impression and plan stable status post left carotid endarterectomy. He'll be seen again in 6 months with repeat carotid duplex. He'll notify should develop any neurologic deficits or wound problems in the interim

## 2011-12-21 ENCOUNTER — Other Ambulatory Visit: Payer: Self-pay | Admitting: Cardiovascular Disease

## 2011-12-27 NOTE — Telephone Encounter (Signed)
Can this encounter be closed?

## 2011-12-30 ENCOUNTER — Other Ambulatory Visit: Payer: Self-pay | Admitting: Emergency Medicine

## 2012-01-02 ENCOUNTER — Other Ambulatory Visit: Payer: Self-pay | Admitting: Emergency Medicine

## 2012-02-10 ENCOUNTER — Other Ambulatory Visit: Payer: Self-pay | Admitting: Cardiovascular Disease

## 2012-02-14 ENCOUNTER — Other Ambulatory Visit: Payer: Self-pay | Admitting: *Deleted

## 2012-02-14 NOTE — Telephone Encounter (Signed)
Received faxed refill request, no recent appt with you and no future appt with you. Ok to refill?

## 2012-02-15 MED ORDER — FLUTICASONE-SALMETEROL 500-50 MCG/DOSE IN AEPB: 1.0000 | INHALATION_SPRAY | Freq: Two times a day (BID) | RESPIRATORY_TRACT | Status: DC

## 2012-02-15 NOTE — Telephone Encounter (Signed)
Made f/u appt for 08/08/11 and refilled meds till then

## 2012-02-15 NOTE — Telephone Encounter (Signed)
He has seen his pulmonary doctor - so up to date with care  Please refil for 6 mo  Schedule f/u appt with me in about 6 mo Thanks

## 2012-03-17 ENCOUNTER — Ambulatory Visit (INDEPENDENT_AMBULATORY_CARE_PROVIDER_SITE_OTHER): Payer: Medicare Other | Admitting: Emergency Medicine

## 2012-03-17 ENCOUNTER — Encounter: Payer: Self-pay | Admitting: Emergency Medicine

## 2012-03-17 VITALS — BP 120/60 | HR 73 | Temp 97.8°F | Ht 70.0 in | Wt 203.4 lb

## 2012-03-17 DIAGNOSIS — J449 Chronic obstructive pulmonary disease, unspecified: Secondary | ICD-10-CM

## 2012-03-17 DIAGNOSIS — Z23 Encounter for immunization: Secondary | ICD-10-CM

## 2012-03-17 DIAGNOSIS — F17200 Nicotine dependence, unspecified, uncomplicated: Secondary | ICD-10-CM

## 2012-03-17 NOTE — Progress Notes (Signed)
Subjective:  Patient ID: [name], male    DOB: Aug 26, 1940, 71 y.o.   MRN: [id number] HPI 71 yo smoker (~100pk-yrs), hx of CAD/PTCI, HTN,  prostate CA. Dx with COPD around 10 yrs ago. Last seen 2007 by PW. Maintanance was Advair bid. Previously on Spiriva, stopped in 2008.   ROV 12/29/10 -- Severe COPD, CAD (PTCI by Dr Clifton James). Walking oximetry last visit showed desats but he didn't want to start O2. Tells me that he was seen by Dr Clifton James last week, exertional SOB, CP, weakness. He underwent L cath with PTCI. He reports continued DOE, but CP has resolved, weakness is better. Last time we did experiment changing Advair to Symbicort - he believes that it is at least as good, not necessarily better. Still on Spiriva. He has not restarted quinipril. We treated him prednisone after his last visit here, he says it helped his stamina, may have made breathing better. Smoking 5-10 cig a day.   ROV 03/08/11 -- Severe COPD, documented hypoxemia but has not wanted O2. Had been well until developed URI about 2 weeks ago. Has had progressive SOB, wheeze, cough. Prod of green/brown, sometimes clear. He stopped smoking about 1 weeks ago.  Remains on Spiriva, went back to Advair.   ROV 04/08/11 -- Severe COPD, documented hypoxemia but has not wanted O2. Reports that he has restarted smoking - about 1/3 of a pack daily. Did not tolerate Chantix. He is improved after rx with doxy and pred 1 months ago. Some cough, less freq - clear sputum. Remains on Spiriva and Advair 500/50.   ROV 07/14/11 -- Severe COPD, documented hypoxemia but has not wanted O2. Remains on Spiriva and Advair 500/50. DuoNebs prn, hasn't needed since last visit. Has been using albuterol HFA about once a day. Still smoking. Unfortunately his wife died [id number] tobacco hasn't been possible yet. He wants to quit again, but not ready to set quit date. He is still working.   ROV 10/26/11 -- Hx CAD, Severe COPD, documented hypoxemia but has not  wanted O2. Remains on Spiriva and Advair 500/50. DuoNebs or albuterol prn. Still smoking 3/4 pk a day. Remains on Spiriva + Advair. No AE since last visit. Able to work part-time, has difficulty w yard work.  Planning for L carotid sgy with Dr [name]. Some increased cough last 3 -4 days, more mucous. No change in dyspnea or wheeze.   ROV 03/17/12 -- Hx CAD, Severe COPD, documented hypoxemia but has not wanted O2. Remains on Spiriva and Advair 500/50. DuoNebs or albuterol prn >> uses it 1 -2 x a day. 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 Has had some dry cough last few days.  He is starting the e-cig, hasn't had a cigarette in 2-3 days.    Objective:  Physical Exam Filed Vitals:   03/17/12 1543  BP: 120/60  Pulse: 73  Temp: 97.8 F (36.6 C)   Gen: Pleasant, obese man, in no distress,  normal affect  ENT: No lesions,  mouth clear,  oropharynx clear, no postnasal drip  Neck: No JVD, no TMG, no carotid bruits  Lungs: No use of accessory muscles, no dullness to percussion, Coarse BS w/ few rhonchi, few scattered wheezes  Cardiovascular: RRR, heart sounds normal, no murmur or gallops, no peripheral edema  Musculoskeletal: No deformities, no cyanosis or clubbing  Neuro: alert, non focal  Skin: Warm, no lesions or rashes   Assessment & Plan:  Smoker Currently trying to quit  using the e-cig. I supported his efforts  COPD Stable, no AE since last time.  - smoking cessation - Advair + Spiriva - prn albuterol - flu shot today - rov 4

## 2012-03-17 NOTE — Patient Instructions (Addendum)
Continue your current medications Flu shot today Congratulations on your efforts to quit smoking!! Cal our office if we can help or support you.  Follow with Dr [name] in 4 months or sooner if you have any problems.

## 2012-03-17 NOTE — Assessment & Plan Note (Signed)
Stable, no AE since last time.  - smoking cessation - Advair + Spiriva - prn albuterol - flu shot today - rov 4

## 2012-03-17 NOTE — Assessment & Plan Note (Signed)
Currently trying to quit using the e-cig. I supported his efforts

## 2012-03-23 ENCOUNTER — Other Ambulatory Visit: Payer: Self-pay | Admitting: Emergency Medicine

## 2012-04-03 ENCOUNTER — Encounter: Payer: Self-pay | Admitting: Family Medicine

## 2012-04-03 ENCOUNTER — Ambulatory Visit (INDEPENDENT_AMBULATORY_CARE_PROVIDER_SITE_OTHER): Payer: Medicare Other | Admitting: Family Medicine

## 2012-04-03 VITALS — BP 140/68 | HR 90 | Temp 97.7°F | Ht 70.0 in | Wt 198.8 lb

## 2012-04-03 DIAGNOSIS — L304 Erythema intertrigo: Secondary | ICD-10-CM | POA: Insufficient documentation

## 2012-04-03 DIAGNOSIS — L0292 Furuncle, unspecified: Secondary | ICD-10-CM

## 2012-04-03 DIAGNOSIS — L538 Other specified erythematous conditions: Secondary | ICD-10-CM

## 2012-04-03 MED ORDER — AMOXICILLIN-POT CLAVULANATE 875-125 MG PO TABS: 1.0000 | ORAL_TABLET | Freq: Two times a day (BID) | ORAL | Status: DC

## 2012-04-03 MED ORDER — DOXYCYCLINE HYCLATE 100 MG PO TABS: 100.0000 mg | ORAL_TABLET | Freq: Two times a day (BID) | ORAL | Status: DC

## 2012-04-03 MED ORDER — KETOCONAZOLE 2 % EX CREA: TOPICAL_CREAM | Freq: Every day | CUTANEOUS | Status: DC

## 2012-04-03 NOTE — Assessment & Plan Note (Signed)
Firm with nothing to express or I and D- 4 under L arm and 2 smaller ones under R  See inst AVS Double cover with augmentin and doxy (to cover for poss mrsa) Will update if worse or no imp

## 2012-04-03 NOTE — Progress Notes (Signed)
Subjective:    Patient ID: [name], male    DOB: [birth year], 72 y.o.   MRN: [id number]  HPI Here with cysts under his R arm - and a few starting under his L  Is worried about possible MRSA  These are not draining - but they itch quite a bit and a bit tender Had some several years ago - and they went away on their own   Is worried about infection No fever  Has not been around anyone or thing with a skin infection   Itchier areas are more flat   Patient Active Problem List  Diagnosis  . PURE HYPERCHOLESTEROLEMIA  . HYPERLIPIDEMIA  . ANXIETY  . AMAUROSIS FUGAX  . HYPERTENSION, BENIGN  . MYOCARDIAL INFARCTION, HX OF  . CORONARY ARTERY DISEASE  . CORONARY ATHEROSCLEROSIS NATIVE CORONARY ARTERY  . PERIPHERAL VASCULAR DISEASE  . HEMORRHOIDS  . CHRONIC OBSTRUCTIVE PULMONARY DISEASE, ACUTE EXACERBATION  . EMPHYSEMA  . COPD  . OSTEOARTHRITIS  . PSA, INCREASED  . Smoker  . Occlusion and stenosis of carotid artery without mention of cerebral infarction  . CHEST PAIN  . Palpitations  . Ischemic cardiomyopathy  . Grief reaction  . Carotid artery disease  . Pre-operative cardiovascular examination   Past Medical History  Diagnosis Date  . COPD (chronic obstructive pulmonary disease)   . CAD (coronary artery disease)     Ant MI 50  . HTN (hypertension)   . Hyperlipidemia   . Osteoarthritis   . Peripheral vascular disease   . Prostate cancer   . Carotid artery disease     Right CEA 2002 per [name]  . Myocardial infarction   . GERD (gastroesophageal reflux disease)     occ  . CHF (congestive heart failure)    Past Surgical History  Procedure Date  . Coronary stent placement 97, 2000, 2012    8 stents  . Doppler echocardiography 1995    neg  . Carotid endarterectomy 03-2000    right  . Colonoscopy 04-2001    polyp  . Cardiovascular stress test 12-2006  . Cardiac catheterization   . Vascular surgery   . Endarterectomy 11/24/2011    Procedure:  ENDARTERECTOMY CAROTID;  Surgeon: Larina Earthly, MD;  Location[facility] OR;  Service: Vascular;  Laterality: Left;   History  Substance Use Topics  . Smoking status: Former Smoker -- 1.0 packs/day for 55 years    Types: Cigarettes    Quit date: 03/16/2012  . Smokeless tobacco: Never Used     Comment: currently using "water vapor" smoking system  . Alcohol Use: Yes     Comment: occ   Family History  Problem Relation Age of Onset  . Stroke Father   . Heart disease Mother     pacemaker  . Other Brother     benign brain tumor   Allergies  Allergen Reactions  . Sulfonamide Derivatives    Current Outpatient Prescriptions on File Prior to Visit  Medication Sig Dispense Refill  . albuterol (PROVENTIL HFA;VENTOLIN HFA) 108 (90 BASE) MCG/ACT inhaler Inhale 2 puffs into the lungs every 6 (six) hours as needed. For shortness of breath      . ALPRAZolam (XANAX) 0.5 MG tablet Take 0.5 mg by mouth at bedtime as needed. Sleep or anxiety      . aspirin 81 MG tablet Take 81 mg by mouth daily.        . clopidogrel (PLAVIX) 75 MG tablet Take 1 tablet (75 mg total)  by mouth daily.  30 tablet  2  . Fluticasone-Salmeterol (ADVAIR) 500-50 MCG/DOSE AEPB Inhale 1 puff into the lungs every 12 (twelve) hours.  60 each  5  . loratadine (CLARITIN) 10 MG tablet Take 10 mg by mouth daily.      Marland Kitchen losartan (COZAAR) 50 MG tablet TAKE 1 TABLET (50 MG TOTAL) BY MOUTH DAILY.  30 tablet  9  . metoprolol tartrate (LOPRESSOR) 25 MG tablet Take 0.5 tablets (12.5 mg total) by mouth 2 (two) times daily. 1/2 tablet twice daily  30 tablet  3  . nitroGLYCERIN (NITROSTAT) 0.4 MG SL tablet Place 0.4 mg under the tongue every 5 (five) minutes as needed. For chest pain      . SPIRIVA HANDIHALER 18 MCG inhalation capsule INHALE 1 CAPSULE VIA HANDIHALER ONCE DAILY AT THE SAME TIME EVERY DAY  30 each  6  . Tamsulosin HCl (FLOMAX) 0.4 MG CAPS Take 0.4 mg by mouth daily.       . VENTOLIN HFA 108 (90 BASE) MCG/ACT inhaler INHALE 2 PUFFS INTO  THE LUNGS EVERY 4 (FOUR) HOURS AS NEEDED FOR WHEEZING OR SHORTNESS OF BREATH.  18 each  6  . rosuvastatin (CRESTOR) 5 MG tablet Take 1 tablet (5 mg total) by mouth at bedtime.  30 tablet  11      Review of Systems Review of Systems  Constitutional: Negative for fever, appetite change, fatigue and unexpected weight change.  Eyes: Negative for pain and visual disturbance.  Respiratory: Negative for cough and shortness of breath.   Cardiovascular: Negative for cp or palpitations    Gastrointestinal: Negative for nausea, diarrhea and constipation.  Genitourinary: Negative for urgency and frequency.  Skin: Negative for pallor , pos for itchy rash under arms and also painful boils Neurological: Negative for weakness, light-headedness, numbness and headaches.  Hematological: Negative for adenopathy. Does not bruise/bleed easily.  Psychiatric/Behavioral: Negative for dysphoric mood. The patient is not nervous/anxious.         Objective:   Physical Exam  Constitutional: He appears well-developed and well-nourished. No distress.  HENT:  Head: Normocephalic and atraumatic.  Eyes: Conjunctivae normal and EOM are normal. Pupils are equal, round, and reactive to light.  Neck: Normal range of motion. Neck supple.  Cardiovascular: Normal rate and regular rhythm.   Pulmonary/Chest: Effort normal and breath sounds normal. No respiratory distress. He has no wheezes. He has no rales.       Diffusely distant bs   Lymphadenopathy:    He has no cervical adenopathy.  Neurological: He is alert. He has normal reflexes.  Skin: Skin is warm and dry. Rash noted. There is erythema. No pallor.       Firm areas of induration under both arms - tender and consistent with early boils/infected cysts Not fluctuant/ draining and nothing to express Also some oval areas of flat erythema with scant scale consistent with intertrigo No excoriation  Psychiatric: He has a normal mood and affect.          Assessment  & Plan:

## 2012-04-03 NOTE — Assessment & Plan Note (Signed)
Mild- under arms and intensely itchy tx with ketoconazole cream  Also adv to keep areas dry  Also treating some boils in those areas Update if not starting to improve in a week or if worsening

## 2012-04-03 NOTE — Patient Instructions (Addendum)
I think you have some infected cysts (boils) under arms and also a topical fungal infection Take both antibiotics as directed with non dairy food  Use warm compresses (dry) under arms when you can  Wash with antibacterial soap  For the itchy flat red areas (I think those may be yeast)- use the ketoconazole cream once daily  Update if not starting to improve in a week or if worsening

## 2012-04-04 ENCOUNTER — Encounter: Payer: Self-pay | Admitting: Family Medicine

## 2012-04-04 ENCOUNTER — Telehealth: Payer: Self-pay | Admitting: Family Medicine

## 2012-04-04 ENCOUNTER — Ambulatory Visit (INDEPENDENT_AMBULATORY_CARE_PROVIDER_SITE_OTHER): Payer: Medicare Other | Admitting: Family Medicine

## 2012-04-04 ENCOUNTER — Other Ambulatory Visit: Payer: Self-pay | Admitting: Emergency Medicine

## 2012-04-04 VITALS — BP 118/52 | HR 90 | Temp 98.2°F | Wt 198.0 lb

## 2012-04-04 DIAGNOSIS — R112 Nausea with vomiting, unspecified: Secondary | ICD-10-CM

## 2012-04-04 DIAGNOSIS — R197 Diarrhea, unspecified: Secondary | ICD-10-CM

## 2012-04-04 NOTE — Patient Instructions (Signed)
Stop augmentin for now.  Continue doxycycline twice daily for full duration of antibiotics. Bland diet - lots of fluids is the most important thing right now. Once feeling better, start eating some yogurt. If infection under arms is not improving, let us know and we could restart augmentin.

## 2012-04-04 NOTE — Progress Notes (Signed)
  Subjective:    Patient ID: [name], male    DOB: [birth year], 72 y.o.   MRN: [id number]  HPI CC: GI upset  Seen here yesterday with concern for axillary boils bilaterally, started augmentin 875 mg bid and doxycycline 100mg  bid.  Yesterday morning prior to coming in to see PCP, awoke with diarrhea x1.  Felt fine the rest of the day.  Took first dose of meds at 4pm yesterday.  At 9pm started having nausea/vomiting x4, frequent diarrhea.  No more vomiting or nausea today.  Is having diarrhea every hour today.  Watery diarrhea.  + indigestion.  Thinks staying well hydrated with water and gatorade.  No blood in stool.  Nonbloody, nonbilious emesis.  No fevers/chills, abd pain.  Appetite down. No sick contacts at home. No new or suspect foods in last 2 days.  H/o CAD s/p MI with CHF.  Smoker - on e cig, weaning off cigarettes.  Denies increased SOB or cough from baseline despite lung exam today.  Past Medical History  Diagnosis Date  . COPD (chronic obstructive pulmonary disease)   . CAD (coronary artery disease)     Ant MI 68  . HTN (hypertension)   . Hyperlipidemia   . Osteoarthritis   . Peripheral vascular disease   . Prostate cancer   . Carotid artery disease     Right CEA 2002 per [name]  . Myocardial infarction   . GERD (gastroesophageal reflux disease)     occ  . CHF (congestive heart failure)     Review of Systems Per HPI    Objective:   Physical Exam  Nursing note and vitals reviewed. Constitutional: He appears well-developed and well-nourished. No distress.  HENT:  Head: Normocephalic and atraumatic.  Mouth/Throat: Oropharynx is clear and moist. No oropharyngeal exudate.  Eyes: Conjunctivae normal are normal. Pupils are equal, round, and reactive to light.  Cardiovascular: Normal rate, regular rhythm, normal heart sounds and intact distal pulses.   No murmur heard. Pulmonary/Chest: Effort normal. No respiratory distress. He has wheezes (diffuse  insp/exp wheezing). He has no rales.  Abdominal: Soft. Bowel sounds are normal. He exhibits no distension and no mass. There is no hepatosplenomegaly. There is no tenderness. There is no rebound, no guarding and no CVA tenderness. A hernia (umbilical, reducible and nontender) is present.  Skin:       Bilateral erythematous indurated boils under arms, none actively draining       Assessment & Plan:

## 2012-04-04 NOTE — Telephone Encounter (Signed)
Seen today. 

## 2012-04-04 NOTE — Telephone Encounter (Signed)
Patient Information:  Caller Name: Nachmen  Phone: [phone]  Patient: [name], [name]  Gender: Male  DOB: July 10, 1940  Age: 72 Years  PCP: [name] [facility])  Office Follow Up:  Does the office need to follow up with this patient?: Yes  Instructions For The Office: please consider taking pt to exam room when arrives at office due to frequent diarrhea.  RN Note:  MD started treated with Augmentin and Doxicycline for multiple boils in [place] 04/03/12.  Vomited 4 times 04/03/12; reports 6 stools 04/04/12. Felt weakness/ "wobbly legs" when first got up this moorning that resolved. Voding per normal. Took morning dose of antibiotics at 14:00.  Symptoms  Reason For Call & Symptoms: Called regarding diarrhea and vomiting after starting two anticiotics  Reviewed Health History In EMR: Yes  Reviewed Medications In EMR: Yes  Reviewed Allergies In EMR: Yes  Reviewed Surgeries / Procedures: Yes  Date of Onset of Symptoms: 04/03/2012  Treatments Tried: keeping hydrated  Treatments Tried Worked: No  Guideline(s) Used:  Diarrhea  Disposition Per Guideline:   Go to Office Now  Reason For Disposition Reached:   Age > 60 years and has had > 6 diarrhea stools in past 24 hours  Advice Given:  N/A  Appointment Scheduled:  04/04/2012 15:30:00 Appointment Scheduled Provider:  Eustaquio Boyden (Family Practice)

## 2012-04-04 NOTE — Assessment & Plan Note (Signed)
Anticipate more of a viral gastroenteritis given GI sxs started prior to starting abx. However, combo of augmentin and doxy likely not helping GI sxs. Recommended stop augmentin for now, continue only doxy for axillary boils. Supportive care for GI sxs.  Discussed importance of hydration status. Pt agrees with plan.

## 2012-05-05 ENCOUNTER — Ambulatory Visit: Payer: Medicare Other | Admitting: Cardiovascular Disease

## 2012-06-05 ENCOUNTER — Encounter: Payer: Self-pay | Admitting: Vascular Surgery

## 2012-06-06 ENCOUNTER — Encounter: Payer: Self-pay | Admitting: Vascular Surgery

## 2012-06-06 ENCOUNTER — Ambulatory Visit (INDEPENDENT_AMBULATORY_CARE_PROVIDER_SITE_OTHER): Payer: Medicare Other | Admitting: Vascular Surgery

## 2012-06-06 ENCOUNTER — Other Ambulatory Visit (INDEPENDENT_AMBULATORY_CARE_PROVIDER_SITE_OTHER): Payer: Medicare Other | Admitting: Vascular Surgery

## 2012-06-06 DIAGNOSIS — Z48812 Encounter for surgical aftercare following surgery on the circulatory system: Secondary | ICD-10-CM

## 2012-06-06 DIAGNOSIS — I6529 Occlusion and stenosis of unspecified carotid artery: Secondary | ICD-10-CM

## 2012-06-06 NOTE — Addendum Note (Signed)
Addended by: Adria Dill L on: 06/06/2012 03:55 PM   Modules accepted: Orders

## 2012-06-06 NOTE — Addendum Note (Signed)
Addended by: Dannielle Karvonen on: 06/06/2012 02:39 PM   Modules accepted: Orders

## 2012-06-06 NOTE — Progress Notes (Signed)
The patient has today for followup of bilateral carotid endarterectomies. Initially this was the right carotid in 2002 and he underwent left carotid endarterectomy in September of 2013. He reports no new neurologic deficits. He does have some persistent parents is no numbness on the left neck and this is improving. He does report some numbness on the very distal tip of his right fifth finger but otherwise no neurologic deficits. He has no motor difficulty and right well and is right-handed.  Past Medical History  Diagnosis Date  . COPD (chronic obstructive pulmonary disease)   . CAD (coronary artery disease)     Ant MI 72  . HTN (hypertension)   . Hyperlipidemia   . Osteoarthritis   . Peripheral vascular disease   . Prostate cancer   . Carotid artery disease     Right CEA 2002 per [name]  . Myocardial infarction   . GERD (gastroesophageal reflux disease)     occ  . CHF (congestive heart failure)     History  Substance Use Topics  . Smoking status: Former Smoker -- 1.00 packs/day for 55 years    Types: Cigarettes    Quit date: 03/16/2012  . Smokeless tobacco: Never Used     Comment: currently using "water vapor" smoking system  . Alcohol Use: Yes     Comment: occ    Family History  Problem Relation Age of Onset  . Stroke Father   . Heart disease Mother     pacemaker  . Other Brother     benign brain tumor    Allergies  Allergen Reactions  . Sulfonamide Derivatives     Current outpatient prescriptions:albuterol (PROVENTIL HFA;VENTOLIN HFA) 108 (90 BASE) MCG/ACT inhaler, Inhale 2 puffs into the lungs every 6 (six) hours as needed. For shortness of breath, Disp: , Rfl: ;  ALPRAZolam (XANAX) 0.5 MG tablet, Take 0.5 mg by mouth at bedtime as needed. Sleep or anxiety, Disp: , Rfl: ;  aspirin 81 MG tablet, Take 81 mg by mouth daily.  , Disp: , Rfl:  clopidogrel (PLAVIX) 75 MG tablet, Take 1 tablet (75 mg total) by mouth daily., Disp: 30 tablet, Rfl: 2;  doxycycline  (VIBRA-TABS) 100 MG tablet, Take 1 tablet (100 mg total) by mouth 2 (two) times daily., Disp: 14 tablet, Rfl: 0;  Fluticasone-Salmeterol (ADVAIR) 500-50 MCG/DOSE AEPB, Inhale 1 puff into the lungs every 12 (twelve) hours., Disp: 60 each, Rfl: 5 ketoconazole (NIZORAL) 2 % cream, Apply topically daily. On the flat itchy areas under arms, Disp: 15 g, Rfl: 1;  loratadine (CLARITIN) 10 MG tablet, Take 10 mg by mouth daily., Disp: , Rfl: ;  loratadine (CLARITIN) 10 MG tablet, TAKE 1 TABLET EVERY DAY, Disp: 30 tablet, Rfl: 3;  losartan (COZAAR) 50 MG tablet, TAKE 1 TABLET (50 MG TOTAL) BY MOUTH DAILY., Disp: 30 tablet, Rfl: 9 metoprolol tartrate (LOPRESSOR) 25 MG tablet, Take 0.5 tablets (12.5 mg total) by mouth 2 (two) times daily. 1/2 tablet twice daily, Disp: 30 tablet, Rfl: 3;  nitroGLYCERIN (NITROSTAT) 0.4 MG SL tablet, Place 0.4 mg under the tongue every 5 (five) minutes as needed. For chest pain, Disp: , Rfl: ;  SPIRIVA HANDIHALER 18 MCG inhalation capsule, INHALE 1 CAPSULE VIA HANDIHALER ONCE DAILY AT THE SAME TIME EVERY DAY, Disp: 30 each, Rfl: 6 Tamsulosin HCl (FLOMAX) 0.4 MG CAPS, Take 0.4 mg by mouth daily. , Disp: , Rfl: ;  VENTOLIN HFA 108 (90 BASE) MCG/ACT inhaler, INHALE 2 PUFFS INTO THE LUNGS EVERY 4 (  FOUR) HOURS AS NEEDED FOR WHEEZING OR SHORTNESS OF BREATH., Disp: 18 each, Rfl: 6;  rosuvastatin (CRESTOR) 5 MG tablet, Take 1 tablet (5 mg total) by mouth at bedtime., Disp: 30 tablet, Rfl: 11  BP 122/71  Pulse 92  Resp 18  Ht 5\' 11"  (1.803 m)  Wt 197 lb 8 oz (89.585 kg)  BMI 27.56 kg/m2  Body mass index is 27.56 kg/(m^2).       Physical exam: Well-developed well-nourished white male in no acute distress Respirations equal and nonlabored Regular rate and rhythm Carotid arteries without bruits bilaterally. Well-healed incisions bilaterally 2+ radial pulses bilaterally Grossly intact neurologically.  Carotid duplex today was interpreted by myself. This does show no evidence of  stenosis in the right or left carotid endarterectomy sites.  Pressure in stable followup from bilateral carotid endarterectomies most recently left carotid in September 2013. He will continue his usual activities we will see him in 6 months with continued duplex surveillance

## 2012-06-07 ENCOUNTER — Other Ambulatory Visit: Payer: Self-pay | Admitting: Emergency Medicine

## 2012-06-07 ENCOUNTER — Other Ambulatory Visit: Payer: Self-pay | Admitting: Cardiovascular Disease

## 2012-06-16 ENCOUNTER — Ambulatory Visit (INDEPENDENT_AMBULATORY_CARE_PROVIDER_SITE_OTHER): Payer: Medicare Other | Admitting: Cardiovascular Disease

## 2012-06-16 ENCOUNTER — Encounter: Payer: Self-pay | Admitting: Cardiovascular Disease

## 2012-06-16 VITALS — BP 128/66 | HR 88 | Wt 199.0 lb

## 2012-06-16 DIAGNOSIS — F17200 Nicotine dependence, unspecified, uncomplicated: Secondary | ICD-10-CM

## 2012-06-16 DIAGNOSIS — I779 Disorder of arteries and arterioles, unspecified: Secondary | ICD-10-CM

## 2012-06-16 DIAGNOSIS — Z720 Tobacco use: Secondary | ICD-10-CM

## 2012-06-16 DIAGNOSIS — I2510 Atherosclerotic heart disease of native coronary artery without angina pectoris: Secondary | ICD-10-CM

## 2012-06-16 NOTE — Patient Instructions (Addendum)
Your physician wants you to follow-up in:  6 months. You will receive a reminder letter in the mail two months in advance. If you don't receive a letter, please call our office to schedule the follow-up appointment.   

## 2012-06-16 NOTE — Progress Notes (Signed)
History of Present Illness: 72 yo male with history of CAD, COPD, HTN, HLD, GERD, PAD, carotid artery disease who is here today for cardiac follow up. His cardiac history is significant for anterior MI in 1997 tx'd with stent x 2 to the LAD, bare metal stenting to the CFX in 2002 and PCI in 2009 with placement of 2 separate drug eluting stents in the LAD.  Cardiac cath on 04/14/10 with severe stenosis in the distal AV groove Circumflex and the OM. These were both treated with DES. His carotid artery dopplers 06/06/12 in [facility] office showed mild disease in RICA at CEA site and mild disease in the LICA CES site. He is followed by [name] for COPD.   He is here for follow up. He did have one episode of chest pain one week ago, lasted for one minute. Felt like heartburn. Occurred after a spicy meal. Resolved with antacid. No exertional chest pain. Left shoulder pain since he lost his balance. This is painful when he moves his arm. No SOB or palpitations. He has had no chest pains or SOB.  He is using electronic cigarettes.   [name]   Last Lipid Profile:Lipid Panel     Component Value Date/Time   CHOL 141 11/27/2011 0400   TRIG 59 11/27/2011 0400   HDL 51 11/27/2011 0400   CHOLHDL 2.8 11/27/2011 0400   VLDL 12 11/27/2011 0400   LDLCALC 78 11/27/2011 0400     Past Medical History  Diagnosis Date  . COPD (chronic obstructive pulmonary disease)   . CAD (coronary artery disease)     Ant MI 59  . HTN (hypertension)   . Hyperlipidemia   . Osteoarthritis   . Peripheral vascular disease   . Prostate cancer   . Carotid artery disease     Right CEA 2002 per [name]  . Myocardial infarction   . GERD (gastroesophageal reflux disease)     occ  . CHF (congestive heart failure)     Past Surgical History  Procedure Laterality Date  . Coronary stent placement  97, 2000, 2012    8 stents  . Doppler echocardiography  1995    neg  . Carotid endarterectomy  03-2000   right  . Colonoscopy  04-2001    polyp  . Cardiovascular stress test  12-2006  . Cardiac catheterization    . Vascular surgery    . Endarterectomy  11/24/2011    Procedure: ENDARTERECTOMY CAROTID;  Surgeon: Larina Earthly, MD;  Location: [facility] OR;  Service: Vascular;  Laterality: Left;    Current Outpatient Prescriptions  Medication Sig Dispense Refill  . ALPRAZolam (XANAX) 0.5 MG tablet Take 0.5 mg by mouth at bedtime as needed. Sleep or anxiety      . aspirin 81 MG tablet Take 81 mg by mouth daily.        . clopidogrel (PLAVIX) 75 MG tablet Take 1 tablet (75 mg total) by mouth daily.  30 tablet  2  . Fluticasone-Salmeterol (ADVAIR) 500-50 MCG/DOSE AEPB Inhale 1 puff into the lungs every 12 (twelve) hours.  60 each  5  . ketoconazole (NIZORAL) 2 % cream Apply topically daily. On the flat itchy areas under arms  15 g  1  . loratadine (CLARITIN) 10 MG tablet Take 10 mg by mouth daily.      Marland Kitchen losartan (COZAAR) 50 MG tablet TAKE 1 TABLET (50 MG TOTAL) BY MOUTH DAILY.  30 tablet  9  .  metoprolol tartrate (LOPRESSOR) 25 MG tablet TAKE 1/2 TABLET TWICE A DAY  30 tablet  3  . nitroGLYCERIN (NITROSTAT) 0.4 MG SL tablet Place 0.4 mg under the tongue every 5 (five) minutes as needed. For chest pain      . SPIRIVA HANDIHALER 18 MCG inhalation capsule INHALE 1 CAPSULE VIA HANDIHALER ONCE DAILY AT THE SAME TIME EVERY DAY  30 each  6  . Tamsulosin HCl (FLOMAX) 0.4 MG CAPS Take 0.4 mg by mouth daily.       . VENTOLIN HFA 108 (90 BASE) MCG/ACT inhaler INHALE 2 PUFFS INTO THE LUNGS EVERY 4 (FOUR) HOURS AS NEEDED FOR WHEEZING OR SHORTNESS OF BREATH.  18 each  6   No current facility-administered medications for this visit.    Allergies  Allergen Reactions  . Sulfonamide Derivatives     History   Social History  . Marital Status: Widowed    Spouse Name: N/A    Number of Children: N/A  . Years of Education: N/A   Occupational History  . firefighter    Social History Main Topics  . Smoking status:  Former Smoker -- 1.00 packs/day for 55 years    Types: Cigarettes    Quit date: 03/16/2012  . Smokeless tobacco: Never Used     Comment: currently using "water vapor" smoking system  . Alcohol Use: Yes     Comment: occ  . Drug Use: No  . Sexually Active: Not on file   Other Topics Concern  . Not on file   Social History Narrative  . No narrative on file    Family History  Problem Relation Age of Onset  . Stroke Father   . Heart disease Mother     pacemaker  . Other Brother     benign brain tumor    Review of Systems:  As stated in the HPI and otherwise negative.   BP 128/66  Pulse 88  Wt 199 lb (90.266 kg)  BMI 27.77 kg/m2  Physical Examination: General: Well developed, well nourished, NAD HEENT: OP clear, mucus membranes moist SKIN: warm, dry. No rashes. Neuro: No focal deficits Musculoskeletal: Muscle strength 5/5 all ext Psychiatric: Mood and affect normal Neck: No JVD, no carotid bruits, no thyromegaly, no lymphadenopathy. Lungs:Clear bilaterally, no wheezes, rhonci, crackles Cardiovascular: Regular rate and rhythm. No murmurs, gallops or rubs. Abdomen:Soft. Bowel sounds present. Non-tender.  Extremities: No lower extremity edema. Pulses are 2 + in the bilateral DP/PT.  EKG: NSR, rate 88 bpm. LAFB.   Assessment and Plan:   1. CAD: Stable. He is on good medical therapy. No signs or symptoms of unstable angina. BP is well controlled. Lipids are well controlled.  No changes today.  Will continue dual anti-platelet therapy with ASA and Plavix since has multiple stents.      2. Carotid artery disease: Stable s/p bilateral CEA. Followed by [name]. Recent carotid dopplers 06/06/12 in [facility] office.   3. Tobacco abuse: Smoking cessation encouraged. He is using electronic cigarettes.

## 2012-07-10 ENCOUNTER — Ambulatory Visit (INDEPENDENT_AMBULATORY_CARE_PROVIDER_SITE_OTHER): Payer: Medicare Other | Admitting: Family Medicine

## 2012-07-10 ENCOUNTER — Encounter: Payer: Self-pay | Admitting: Family Medicine

## 2012-07-10 VITALS — BP 142/60 | HR 78 | Temp 98.2°F | Ht 71.0 in | Wt 200.2 lb

## 2012-07-10 DIAGNOSIS — L0292 Furuncle, unspecified: Secondary | ICD-10-CM

## 2012-07-10 MED ORDER — CLINDAMYCIN HCL 300 MG PO CAPS: 300.0000 mg | ORAL_CAPSULE | Freq: Three times a day (TID) | ORAL | Status: DC

## 2012-07-10 NOTE — Assessment & Plan Note (Signed)
Recurrent under both arms  No rash this time  tx with clindamycin  No pus to culture  Disc using warm compresses if tolerable See AVS for further inst Will call if no imp in 10 d or if worse

## 2012-07-10 NOTE — Progress Notes (Signed)
Subjective:    Patient ID: [name], male    DOB: 1940-04-07, 72 y.o.   MRN: [id number]  HPI Here with rash under his arms (axilla) also right shoulder pain  L side is very painful  Going on for 4 weeks  The boils are back (were draining 3 weeks ago)-not now  Used the rest of the ketoconazole cream  Tylenol for pain   Itch is severe Pain is moderate    No other places - for rash   Last time he had this - he had boils also (augmentin made him sick and took another antibiotic)  Shoulder pain after a slip and fall - fell to the right but tried to catch itself with L hand/ arm  That was in December  Pain is worse to reach up/ abduct arm  No swelling   Patient Active Problem List  Diagnosis  . PURE HYPERCHOLESTEROLEMIA  . HYPERLIPIDEMIA  . ANXIETY  . AMAUROSIS FUGAX  . HYPERTENSION, BENIGN  . MYOCARDIAL INFARCTION, HX OF  . CORONARY ARTERY DISEASE  . CORONARY ATHEROSCLEROSIS NATIVE CORONARY ARTERY  . PERIPHERAL VASCULAR DISEASE  . HEMORRHOIDS  . CHRONIC OBSTRUCTIVE PULMONARY DISEASE, ACUTE EXACERBATION  . EMPHYSEMA  . COPD  . OSTEOARTHRITIS  . PSA, INCREASED  . Smoker  . Occlusion and stenosis of carotid artery without mention of cerebral infarction  . CHEST PAIN  . Palpitations  . Ischemic cardiomyopathy  . Grief reaction  . Carotid artery disease  . Pre-operative cardiovascular examination  . Boils  . Intertrigo  . Nausea vomiting and diarrhea   Past Medical History  Diagnosis Date  . COPD (chronic obstructive pulmonary disease)   . CAD (coronary artery disease)     Ant MI 3  . HTN (hypertension)   . Hyperlipidemia   . Osteoarthritis   . Peripheral vascular disease   . Prostate cancer   . Carotid artery disease     Right CEA 2002 per [name]  . Myocardial infarction   . GERD (gastroesophageal reflux disease)     occ  . CHF (congestive heart failure)    Past Surgical History  Procedure Laterality Date  . Coronary stent placement  97,  2000, 2012    8 stents  . Doppler echocardiography  1995    neg  . Carotid endarterectomy  03-2000    right  . Colonoscopy  04-2001    polyp  . Cardiovascular stress test  12-2006  . Cardiac catheterization    . Vascular surgery    . Endarterectomy  11/24/2011    Procedure: ENDARTERECTOMY CAROTID;  Surgeon: Larina Earthly, MD;  Location: [facility] OR;  Service: Vascular;  Laterality: Left;   History  Substance Use Topics  . Smoking status: Former Smoker -- 1.00 packs/day for 55 years    Types: Cigarettes    Quit date: 03/16/2012  . Smokeless tobacco: Never Used     Comment: currently using "water vapor" smoking system  . Alcohol Use: Yes     Comment: occ   Family History  Problem Relation Age of Onset  . Stroke Father   . Heart disease Mother     pacemaker  . Other Brother     benign brain tumor   Allergies  Allergen Reactions  . Sulfonamide Derivatives    Current Outpatient Prescriptions on File Prior to Visit  Medication Sig Dispense Refill  . ALPRAZolam (XANAX) 0.5 MG tablet Take 0.5 mg by mouth at bedtime as needed. Sleep or anxiety      .  aspirin 81 MG tablet Take 81 mg by mouth daily.        . clopidogrel (PLAVIX) 75 MG tablet Take 1 tablet (75 mg total) by mouth daily.  30 tablet  2  . Fluticasone-Salmeterol (ADVAIR) 500-50 MCG/DOSE AEPB Inhale 1 puff into the lungs every 12 (twelve) hours.  60 each  5  . loratadine (CLARITIN) 10 MG tablet Take 10 mg by mouth daily.      Marland Kitchen losartan (COZAAR) 50 MG tablet TAKE 1 TABLET (50 MG TOTAL) BY MOUTH DAILY.  30 tablet  9  . metoprolol tartrate (LOPRESSOR) 25 MG tablet TAKE 1/2 TABLET TWICE A DAY  30 tablet  3  . nitroGLYCERIN (NITROSTAT) 0.4 MG SL tablet Place 0.4 mg under the tongue every 5 (five) minutes as needed. For chest pain      . SPIRIVA HANDIHALER 18 MCG inhalation capsule INHALE 1 CAPSULE VIA HANDIHALER ONCE DAILY AT THE SAME TIME EVERY DAY  30 each  6  . Tamsulosin HCl (FLOMAX) 0.4 MG CAPS Take 0.4 mg by mouth daily.        . VENTOLIN HFA 108 (90 BASE) MCG/ACT inhaler INHALE 2 PUFFS INTO THE LUNGS EVERY 4 (FOUR) HOURS AS NEEDED FOR WHEEZING OR SHORTNESS OF BREATH.  18 each  6  . ketoconazole (NIZORAL) 2 % cream Apply topically daily. On the flat itchy areas under arms  15 g  1   No current facility-administered medications on file prior to visit.     Review of Systems    Review of Systems  Constitutional: Negative for fever, appetite change, fatigue and unexpected weight change.  Eyes: Negative for pain and visual disturbance.  Respiratory: Negative for cough and shortness of breath.   Cardiovascular: Negative for cp or palpitations    Gastrointestinal: Negative for nausea, diarrhea and constipation.  Genitourinary: Negative for urgency and frequency.  Skin: Negative for pallor or and pos for red rash and cysts  Neurological: Negative for weakness, light-headedness, numbness and headaches.  Hematological: Negative for adenopathy. Does not bruise/bleed easily.  Psychiatric/Behavioral: Negative for dysphoric mood. The patient is not nervous/anxious.      Objective:   Physical Exam  Constitutional: He appears well-developed and well-nourished. No distress.  HENT:  Head: Normocephalic and atraumatic.  Eyes: Conjunctivae and EOM are normal. Pupils are equal, round, and reactive to light. Right eye exhibits no discharge. Left eye exhibits no discharge.  Neck: Normal range of motion. Neck supple.  Cardiovascular: Normal rate and regular rhythm.   Musculoskeletal: He exhibits tenderness. He exhibits no edema.  Lymphadenopathy:    He has no cervical adenopathy.  Neurological: He is alert.  Skin: Skin is warm and dry. Rash noted. There is erythema.  Cysts in bilateral axillae- firm (not fluctuant) and not draining Tender/ erythematous  3 in each side - largest about 1 cm Scars evident from previous cysts   Psychiatric: He has a normal mood and affect.          Assessment & Plan:

## 2012-07-10 NOTE — Patient Instructions (Addendum)
For the cysts/ boils under arms -take clindamycin as directed Keep area very clean and cool  If you can tolerate warm compresses- try that to see if this will encourage the cysts to drain  Antibacterial soap and water and or rubbing alcohol will help keep areas clean Make appt with Dr Patsy Lager at check out for your shoulder pain

## 2012-07-17 ENCOUNTER — Ambulatory Visit (INDEPENDENT_AMBULATORY_CARE_PROVIDER_SITE_OTHER): Payer: Medicare Other | Admitting: Emergency Medicine

## 2012-07-17 ENCOUNTER — Ambulatory Visit (INDEPENDENT_AMBULATORY_CARE_PROVIDER_SITE_OTHER)
Admission: RE | Admit: 2012-07-17 | Discharge: 2012-07-17 | Disposition: A | Discharge status: Home / Self Care | Payer: Medicare Other | Source: Ambulatory Visit | Attending: Family Medicine | Admitting: Family Medicine

## 2012-07-17 ENCOUNTER — Encounter: Payer: Self-pay | Admitting: Family Medicine

## 2012-07-17 ENCOUNTER — Ambulatory Visit (INDEPENDENT_AMBULATORY_CARE_PROVIDER_SITE_OTHER): Payer: Medicare Other | Admitting: Family Medicine

## 2012-07-17 ENCOUNTER — Encounter: Payer: Self-pay | Admitting: Emergency Medicine

## 2012-07-17 ENCOUNTER — Other Ambulatory Visit: Payer: Self-pay | Admitting: Cardiovascular Disease

## 2012-07-17 VITALS — BP 130/56 | HR 87 | Temp 98.0°F | Ht 70.5 in | Wt 202.4 lb

## 2012-07-17 VITALS — BP 120/70 | HR 82 | Temp 98.3°F | Ht 71.0 in | Wt 200.8 lb

## 2012-07-17 DIAGNOSIS — M25512 Pain in left shoulder: Secondary | ICD-10-CM

## 2012-07-17 DIAGNOSIS — F17200 Nicotine dependence, unspecified, uncomplicated: Secondary | ICD-10-CM

## 2012-07-17 DIAGNOSIS — M25519 Pain in unspecified shoulder: Secondary | ICD-10-CM

## 2012-07-17 DIAGNOSIS — J449 Chronic obstructive pulmonary disease, unspecified: Secondary | ICD-10-CM

## 2012-07-17 MED ORDER — TRAMADOL HCL 50 MG PO TABS: 50.0000 mg | ORAL_TABLET | Freq: Four times a day (QID) | ORAL | Status: DC | PRN

## 2012-07-17 NOTE — Assessment & Plan Note (Signed)
He has stopped cigarettes. We discussed e-cig today (he has been using as a replacement). Informed him that medical community has reservations about the safety of vaporized nicotine. He understands.

## 2012-07-17 NOTE — Progress Notes (Signed)
[facility] [address] [place] [address] Phone: [phone] Fax: [phone]  Date:  07/17/2012   Name:  [name]   DOB:  03-Jan-1941   MRN:  [id number] Gender: male Age: 72 y.o.  Primary Physician:  [name], MD  Evaluating MD: Hannah Beat, MD   Chief Complaint: Shoulder Pain   History of Present Illness:  [name] is a 72 y.o. pleasant patient who presents with the following:  L shoulder, 5 months ago, fell onto the right. At the time of of that initial injury, the patient's LEFT shoulder was wrenched backwards.since that time the patient has had a dramatic problem with abducting his shoulder. Very significant loss of motion and decreased strength in the plane of abduction. Pain with abduction and with internal range of motion. He is not had any bruising or swelling. He does have functional limitation. No prior history of fx in affected joint. When lifting something with his left arm, has a lot of pain in the left.   Multiple medical problems are detailed below. Including multiple percutaneous interventions and stents, history of myocardial infarction, chronic obstructive pulmonary disease, history of congestive heart failure. He recently did have operative intervention in the fall for carotid endarterectomy.  Patient Active Problem List  Diagnosis  . PURE HYPERCHOLESTEROLEMIA  . HYPERLIPIDEMIA  . ANXIETY  . AMAUROSIS FUGAX  . HYPERTENSION, BENIGN  . MYOCARDIAL INFARCTION, HX OF  . CORONARY ARTERY DISEASE  . CORONARY ATHEROSCLEROSIS NATIVE CORONARY ARTERY  . PERIPHERAL VASCULAR DISEASE  . HEMORRHOIDS  . CHRONIC OBSTRUCTIVE PULMONARY DISEASE, ACUTE EXACERBATION  . EMPHYSEMA  . COPD  . OSTEOARTHRITIS  . PSA, INCREASED  . Smoker  . Occlusion and stenosis of carotid artery without mention of cerebral infarction  . CHEST PAIN  . Palpitations  . Ischemic cardiomyopathy  . Grief reaction  . Carotid artery disease  .  Pre-operative cardiovascular examination  . Boils  . Intertrigo  . Nausea vomiting and diarrhea    Past Medical History  Diagnosis Date  . COPD (chronic obstructive pulmonary disease)   . CAD (coronary artery disease)     Ant MI 5  . HTN (hypertension)   . Hyperlipidemia   . Osteoarthritis   . Peripheral vascular disease   . Prostate cancer   . Carotid artery disease     Right CEA 2002 per [name]  . Myocardial infarction   . GERD (gastroesophageal reflux disease)     occ  . CHF (congestive heart failure)     Past Surgical History  Procedure Laterality Date  . Coronary stent placement  97, 2000, 2012    8 stents  . Doppler echocardiography  1995    neg  . Carotid endarterectomy  03-2000    right  . Colonoscopy  04-2001    polyp  . Cardiovascular stress test  12-2006  . Cardiac catheterization    . Vascular surgery    . Endarterectomy  11/24/2011    Procedure: ENDARTERECTOMY CAROTID;  Surgeon: Larina Earthly, MD;  Location: [facility] OR;  Service: Vascular;  Laterality: Left;    History   Social History  . Marital Status: Widowed    Spouse Name: N/A    Number of Children: N/A  . Years of Education: N/A   Occupational History  . firefighter    Social History Main Topics  . Smoking status: Former Smoker -- 1.00 packs/day for 55 years    Types: Cigarettes  Quit date: 03/16/2012  . Smokeless tobacco: Never Used     Comment: currently using "water vapor" smoking system  . Alcohol Use: Yes     Comment: occ  . Drug Use: No  . Sexually Active: Not on file   Other Topics Concern  . Not on file   Social History Narrative  . No narrative on file    Family History  Problem Relation Age of Onset  . Stroke Father   . Heart disease Mother     pacemaker  . Other Brother     benign brain tumor    Allergies  Allergen Reactions  . Augmentin (Amoxicillin-Pot Clavulanate) Nausea And Vomiting    Diarrhea  . Sulfonamide Derivatives     Medication list has been  reviewed and updated.  Outpatient Prescriptions Prior to Visit  Medication Sig Dispense Refill  . ALPRAZolam (XANAX) 0.5 MG tablet Take 0.5 mg by mouth at bedtime as needed. Sleep or anxiety      . aspirin 81 MG tablet Take 81 mg by mouth daily.        . clindamycin (CLEOCIN) 300 MG capsule Take 1 capsule (300 mg total) by mouth 3 (three) times daily.  30 capsule  0  . clopidogrel (PLAVIX) 75 MG tablet Take 1 tablet (75 mg total) by mouth daily.  30 tablet  2  . Fluticasone-Salmeterol (ADVAIR) 500-50 MCG/DOSE AEPB Inhale 1 puff into the lungs every 12 (twelve) hours.  60 each  5  . loratadine (CLARITIN) 10 MG tablet Take 10 mg by mouth daily.      Marland Kitchen losartan (COZAAR) 50 MG tablet TAKE 1 TABLET (50 MG TOTAL) BY MOUTH DAILY.  30 tablet  9  . metoprolol tartrate (LOPRESSOR) 25 MG tablet TAKE 1/2 TABLET TWICE A DAY  30 tablet  3  . nitroGLYCERIN (NITROSTAT) 0.4 MG SL tablet Place 0.4 mg under the tongue every 5 (five) minutes as needed. For chest pain      . SPIRIVA HANDIHALER 18 MCG inhalation capsule INHALE 1 CAPSULE VIA HANDIHALER ONCE DAILY AT THE SAME TIME EVERY DAY  30 each  6  . Tamsulosin HCl (FLOMAX) 0.4 MG CAPS Take 0.4 mg by mouth daily.       . VENTOLIN HFA 108 (90 BASE) MCG/ACT inhaler INHALE 2 PUFFS INTO THE LUNGS EVERY 4 (FOUR) HOURS AS NEEDED FOR WHEEZING OR SHORTNESS OF BREATH.  18 each  6  . ketoconazole (NIZORAL) 2 % cream Apply topically daily. On the flat itchy areas under arms  15 g  1   No facility-administered medications prior to visit.    Review of Systems:   GEN: No fevers, chills. Nontoxic. Primarily MSK c/o today. MSK: Detailed in the HPI GI: tolerating PO intake without difficulty Neuro: No numbness, parasthesias, or tingling associated. Otherwise the pertinent positives of the ROS are noted above.    Physical Examination: BP 120/70  Pulse 82  Temp(Src) 98.3 F (36.8 C) (Oral)  Ht 5\' 11"  (1.803 m)  Wt 200 lb 12 oz (91.06 kg)  BMI 28.01 kg/m2  SpO2  96%  Ideal Body Weight: Weight in (lb) to have BMI = 25: 178.9   GEN: WDWN, NAD, Non-toxic, Alert & Oriented x 3 HEENT: Atraumatic, Normocephalic.  Ears and Nose: No external deformity. EXTR: No clubbing/cyanosis/edema NEURO: Normal gait.  PSYCH: Normally interactive. Conversant. Not depressed or anxious appearing.  Calm demeanor.   Less short: Nontender at the a.c. Joint. Nontender with palpation along the clavicle. Nontender with  palpation of the humerus. Patient has markedly altered mechanics with abduction. Able to abduct approximately 45. Elevation of the LEFT scapula. With passive range of motion, approaching 165 of motion is achieved.  Internal and external range of motion strength is 5/5 in no pain.  Positive drop test. 2/5 in the plane of abduction. Deltoid strength remains intact. Unable to complete empty can testing.  Dg Shoulder Left  07/17/2012  *RADIOLOGY REPORT*  Clinical Data: Left shoulder pain following a fall 5 months ago.  LEFT SHOULDER - 2+ VIEW  Comparison: None.  Findings: Three views of the left shoulder demonstrate mild to moderate inferior acromioclavicular spur formation.  No fracture or dislocation is seen.  IMPRESSION: Mild to moderate inferior acromioclavicular degenerative spur formation.   Original Report Authenticated By: Beckie Salts, M.D.     Assessment and Plan:  Shoulder pain, left - Plan: DG Shoulder Left, MR Shoulder Left Wo Contrast, CANCELED: MR Shoulder Left Wo Contrast  Clinical concern for full thickness rotator cuff tear versus high-grade partial thickness tear on the LEFT. Obtain an MRI of the shoulder without contrast to evaluate given markedly altered mechanics, markedly altered abduction, strength, and a positive drop test.  Orders Today:  Orders Placed This Encounter  Procedures  . DG Shoulder Left    Standing Status: Future     Number of Occurrences: 1     Standing Expiration Date: 09/16/2013    Order Specific Question:  Preferred  imaging location?    Answer:  [facility]    Order Specific Question:  Reason for exam:    Answer:  shoulder pain  . MR Shoulder Left Wo Contrast    Standing Status: Future     Number of Occurrences:      Standing Expiration Date: 09/16/2013    Order Specific Question:  Reason for Exam (SYMPTOM  OR DIAGNOSIS REQUIRED)    Answer:  shoulder pain    Order Specific Question:  Preferred imaging location?    Answer:  External    Order Specific Question:  Does the patient have a pacemaker, internal devices, implants, aneury    Answer:  No    Updated Medication List: (Includes new medications, updates to list, dose adjustments) Meds ordered this encounter  Medications  . traMADol (ULTRAM) 50 MG tablet    Sig: Take 1 tablet (50 mg total) by mouth every 6 (six) hours as needed for pain.    Dispense:  50 tablet    Refill:  2    Medications Discontinued: There are no discontinued medications.    Signed, Elpidio Galea. Monserath Neff, MD 07/17/2012 9:04 AM

## 2012-07-17 NOTE — Assessment & Plan Note (Signed)
Stable, doing well. No AE for over 1.5 yrs. He stopped cigarettes, now uses e-cig. Advair and Spiriva a difficult regimen due to cost, but he has been able to continue - continue the Advair and Spiriva - albuterol prn - will get samples of his BD's to defray cost - not interested in using O2 - rov 4 mon

## 2012-07-17 NOTE — Progress Notes (Signed)
Subjective:  Patient ID: [name], male    DOB: Apr 14, 1940, 72 y.o.   MRN: [id number] HPI 72 yo smoker (~100pk-yrs), hx of CAD/PTCI, HTN,  prostate CA. Dx with COPD around 10 yrs ago. Last seen 2007 by PW. Maintanance was Advair bid. Previously on Spiriva, stopped in 2008.   ROV 12/29/10 -- Severe COPD, CAD (PTCI by Dr Clifton James). Walking oximetry last visit showed desats but he didn't want to start O2. Tells me that he was seen by Dr Clifton James last week, exertional SOB, CP, weakness. He underwent L cath with PTCI. He reports continued DOE, but CP has resolved, weakness is better. Last time we did experiment changing Advair to Symbicort - he believes that it is at least as good, not necessarily better. Still on Spiriva. He has not restarted quinipril. We treated him prednisone after his last visit here, he says it helped his stamina, may have made breathing better. Smoking 5-10 cig a day.   ROV 03/08/11 -- Severe COPD, documented hypoxemia but has not wanted O2. Had been well until developed URI about 2 weeks ago. Has had progressive SOB, wheeze, cough. Prod of green/brown, sometimes clear. He stopped smoking about 1 weeks ago.  Remains on Spiriva, went back to Advair.   ROV 04/08/11 -- Severe COPD, documented hypoxemia but has not wanted O2. Reports that he has restarted smoking - about 1/3 of a pack daily. Did not tolerate Chantix. He is improved after rx with doxy and pred 1 months ago. Some cough, less freq - clear sputum. Remains on Spiriva and Advair 500/50.   ROV 07/14/11 -- Severe COPD, documented hypoxemia but has not wanted O2. Remains on Spiriva and Advair 500/50. DuoNebs prn, hasn't needed since last visit. Has been using albuterol HFA about once a day. Still smoking. Unfortunately his wife died February 06, 2024quitting tobacco hasn't been possible yet. He wants to quit again, but not ready to set quit date. He is still working.   ROV 10/26/11 -- Hx CAD, Severe COPD, documented hypoxemia but has not  wanted O2. Remains on Spiriva and Advair 500/50. DuoNebs or albuterol prn. Still smoking 3/4 pk a day. Remains on Spiriva + Advair. No AE since last visit. Able to work part-time, has difficulty w yard work.  Planning for L carotid sgy with Dr [name]. Some increased cough last 3 -4 days, more mucous. No change in dyspnea or wheeze.   ROV 03/17/12 -- Hx CAD, Severe COPD, documented hypoxemia but has not wanted O2. Remains on Spiriva and Advair 500/50. DuoNebs or albuterol prn >> uses it 1 -2 x a day. 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 Has had some dry cough last few days.  He is starting the e-cig, hasn't had a cigarette in 2-3 days.   ROV 07/17/12 -- Hx CAD, Severe COPD, documented hypoxemia (not on O2). He has not smoked since last time - has instead been using e-cig. Discussed the pro's/con's of this. No flares, no bronchitis. He has some exertional wheeze. His cough has increased with the allergy season. He is on loratadine.    CAT Score 03/17/2012  Total CAT Score 10    Objective:  Physical Exam Filed Vitals:   07/17/12 1419  BP: 130/56  Pulse: 87  Temp: 98 F (36.7 C)   Gen: Pleasant, obese man, in no distress,  normal affect  ENT: No lesions,  mouth clear,  oropharynx clear, no postnasal drip  Neck: No JVD, no TMG, no carotid  bruits  Lungs: No use of accessory muscles, no dullness to percussion, Coarse BS w/ few rhonchi, few scattered wheezes  Cardiovascular: RRR, heart sounds normal, no murmur or gallops, no peripheral edema  Musculoskeletal: No deformities, no cyanosis or clubbing  Neuro: alert, non focal  Skin: Warm, no lesions or rashes   Assessment & Plan:  Smoker He has stopped cigarettes. We discussed e-cig today (he has been using as a replacement). Informed him that medical community has reservations about the safety of vaporized nicotine. He understands.   COPD Stable, doing well. No AE for over 1.5 yrs. He stopped cigarettes,  now uses e-cig. Advair and Spiriva a difficult regimen due to cost, but he has been able to continue - continue the Advair and Spiriva - albuterol prn - will get samples of his BD's to defray cost - not interested in using O2 - rov 4 mon

## 2012-07-17 NOTE — Patient Instructions (Addendum)
Please continue your Spiriva and Advair Use albuterol as needed Follow with Dr [name] in 4 months or sooner if you have any problems

## 2012-07-24 ENCOUNTER — Encounter: Payer: Self-pay | Admitting: Family Medicine

## 2012-07-31 ENCOUNTER — Encounter: Payer: Self-pay | Admitting: Family Medicine

## 2012-07-31 ENCOUNTER — Ambulatory Visit (INDEPENDENT_AMBULATORY_CARE_PROVIDER_SITE_OTHER): Payer: Medicare Other | Admitting: Family Medicine

## 2012-07-31 VITALS — BP 140/82 | HR 73 | Temp 97.8°F | Ht 71.0 in | Wt 199.5 lb

## 2012-07-31 DIAGNOSIS — M75110 Incomplete rotator cuff tear or rupture of unspecified shoulder, not specified as traumatic: Secondary | ICD-10-CM

## 2012-07-31 DIAGNOSIS — M25519 Pain in unspecified shoulder: Secondary | ICD-10-CM

## 2012-07-31 DIAGNOSIS — S43429A Sprain of unspecified rotator cuff capsule, initial encounter: Secondary | ICD-10-CM

## 2012-07-31 DIAGNOSIS — M25512 Pain in left shoulder: Secondary | ICD-10-CM

## 2012-07-31 NOTE — Progress Notes (Signed)
[facility] at [facility] [address] [place] [address] Phone: [phone] Fax: [phone]  Date:  07/31/2012   Name:  [name]   DOB:  February 27, 1941   MRN:  [id number] Gender: male Age: 72 y.o.  Primary Physician:  [name], MD  Evaluating MD: Hannah Beat, MD  Chief Complaint: Shoulder Pain   History of Present Illness:  [name] is a 72 y.o. very pleasant male patient who presents with the following:  F/u L shoulder pain, MRI, partial thickness subscap tear and ac arthropathy as well as biceps tendinopathy. Doing better, ROM is improving, str improving, but he still has pain.  Past Medical History, Surgical History, Social History, Family History, Problem List, Medications, and Allergies have been reviewed and updated if relevant.  Current Outpatient Prescriptions on File Prior to Visit  Medication Sig Dispense Refill  . ALPRAZolam (XANAX) 0.5 MG tablet Take 0.5 mg by mouth at bedtime as needed. Sleep or anxiety      . aspirin 81 MG tablet Take 81 mg by mouth daily.        . clopidogrel (PLAVIX) 75 MG tablet Take 1 tablet (75 mg total) by mouth daily.  30 tablet  2  . Fluticasone-Salmeterol (ADVAIR) 500-50 MCG/DOSE AEPB Inhale 1 puff into the lungs every 12 (twelve) hours.  60 each  5  . ketoconazole (NIZORAL) 2 % cream Apply topically daily. On the flat itchy areas under arms  15 g  1  . loratadine (CLARITIN) 10 MG tablet Take 10 mg by mouth daily.      Marland Kitchen losartan (COZAAR) 50 MG tablet TAKE 1 TABLET (50 MG TOTAL) BY MOUTH DAILY.  30 tablet  9  . metoprolol tartrate (LOPRESSOR) 25 MG tablet TAKE 1/2 TABLET TWICE A DAY  30 tablet  3  . nitroGLYCERIN (NITROSTAT) 0.4 MG SL tablet Place 0.4 mg under the tongue every 5 (five) minutes as needed. For chest pain      . SPIRIVA HANDIHALER 18 MCG inhalation capsule INHALE 1 CAPSULE VIA HANDIHALER ONCE DAILY AT THE SAME TIME EVERY DAY  30 each  6  . Tamsulosin HCl (FLOMAX) 0.4 MG CAPS Take 0.4 mg by  mouth daily.       . traMADol (ULTRAM) 50 MG tablet Take 1 tablet (50 mg total) by mouth every 6 (six) hours as needed for pain.  50 tablet  2  . VENTOLIN HFA 108 (90 BASE) MCG/ACT inhaler INHALE 2 PUFFS INTO THE LUNGS EVERY 4 (FOUR) HOURS AS NEEDED FOR WHEEZING OR SHORTNESS OF BREATH.  18 each  6   No current facility-administered medications on file prior to visit.    Review of Systems:  GEN: No fevers, chills. Nontoxic. Primarily MSK c/o today. MSK: Detailed in the HPI GI: tolerating PO intake without difficulty Neuro: No numbness, parasthesias, or tingling associated. Otherwise the pertinent positives of the ROS are noted above.    Physical Examination: BP 140/82  Pulse 73  Temp(Src) 97.8 F (36.6 C) (Oral)  Ht 5\' 11"  (1.803 m)  Wt 199 lb 8 oz (90.493 kg)  BMI 27.84 kg/m2  SpO2 96%   GEN: Well-developed,well-nourished,in no acute distress; alert,appropriate and cooperative throughout examination HEENT: Normocephalic and atraumatic without obvious abnormalities. Ears, externally no deformities PULM: Breathing comfortably in no respiratory distress EXT: No clubbing, cyanosis, or edema PSYCH: Normally interactive. Cooperative during the interview. Pleasant. Friendly and conversant. Not anxious or depressed appearing. Normal, full affect.  Shoulder: L Inspection: No  muscle wasting or winging Ecchymosis/edema: neg  AC joint, scapula, clavicle: NT Cervical spine: NT, full ROM Spurling's: neg Abduction: full, 4+/5 Flexion: full, 5/5 IR, full, lift-off: 5/5 ER at neutral: full, 5/5 AC crossover: neg Neer: pos Hawkins: pos Drop Test: neg Empty Can: pos Supraspinatus insertion: mild-mod T Bicipital groove: NT Speed's: + Yergason's: neg Sulcus sign: neg Scapular dyskinesis: none C5-T1 intact  Neuro: Sensation intact Grip 5/5   Assessment and Plan:  Partial tear of rotator cuff - Plan: Ambulatory referral to Physical Therapy  Pain in joint, shoulder region, left  - Plan: Ambulatory referral to Physical Therapy  Initiate ROM and str conditioning, rtc and scap stabilizers  SubAC Injection, LEFT Verbal consent was obtained from the patient. Risks (including rare infection), benefits, and alternatives were explained. Patient prepped with Chloraprep and Ethyl Chloride used for anesthesia. The subacromial space was injected using the posterior approach. The patient tolerated the procedure well and had decreased pain post injection. No complications. Injection: 8 cc of Lidocaine 1% and 2 cc of Depo-Medrol 40 mg. Needle: 22 gauge  F/u 6 weeks  Orders Today:  Orders Placed This Encounter  Procedures  . Ambulatory referral to Physical Therapy    Referral Priority:  Routine    Referral Type:  Physical Medicine    Referral Reason:  Specialty Services Required    Requested Specialty:  Physical Therapy    Number of Visits Requested:  1    Updated Medication List: (Includes new medications, updates to list, dose adjustments) No orders of the defined types were placed in this encounter.    Medications Discontinued: Medications Discontinued During This Encounter  Medication Reason  . clindamycin (CLEOCIN) 300 MG capsule Error      Signed, Shiela Bruns T. Khadeja Abt, MD 07/31/2012 8:21 AM

## 2012-07-31 NOTE — Patient Instructions (Addendum)
REFERRAL: GO THE THE FRONT ROOM AT THE ENTRANCE OF OUR CLINIC, NEAR CHECK IN. ASK FOR [name]. SHE WILL HELP YOU SET UP YOUR REFERRAL. DATE: TIME:   F/u 6 weeks 

## 2012-08-05 ENCOUNTER — Other Ambulatory Visit: Payer: Self-pay | Admitting: Emergency Medicine

## 2012-08-07 ENCOUNTER — Encounter: Payer: Self-pay | Admitting: Family Medicine

## 2012-08-07 ENCOUNTER — Ambulatory Visit (INDEPENDENT_AMBULATORY_CARE_PROVIDER_SITE_OTHER): Payer: Medicare Other | Admitting: Family Medicine

## 2012-08-07 VITALS — BP 134/68 | HR 77 | Temp 98.2°F | Ht 71.0 in | Wt 196.0 lb

## 2012-08-07 DIAGNOSIS — I10 Essential (primary) hypertension: Secondary | ICD-10-CM

## 2012-08-07 DIAGNOSIS — K3189 Other diseases of stomach and duodenum: Secondary | ICD-10-CM

## 2012-08-07 DIAGNOSIS — F17200 Nicotine dependence, unspecified, uncomplicated: Secondary | ICD-10-CM

## 2012-08-07 DIAGNOSIS — E785 Hyperlipidemia, unspecified: Secondary | ICD-10-CM

## 2012-08-07 DIAGNOSIS — K30 Functional dyspepsia: Secondary | ICD-10-CM | POA: Insufficient documentation

## 2012-08-07 DIAGNOSIS — R7309 Other abnormal glucose: Secondary | ICD-10-CM

## 2012-08-07 DIAGNOSIS — R1013 Epigastric pain: Secondary | ICD-10-CM

## 2012-08-07 DIAGNOSIS — R739 Hyperglycemia, unspecified: Secondary | ICD-10-CM | POA: Insufficient documentation

## 2012-08-07 LAB — COMPREHENSIVE METABOLIC PANEL
ALT: 10 U/L (ref 0–53)
Albumin: 4.1 g/dL (ref 3.5–5.2)
CO2: 29 mEq/L (ref 19–32)
Calcium: 9.1 mg/dL (ref 8.4–10.5)
Chloride: 102 mEq/L (ref 96–112)
Creatinine, Ser: 1.1 mg/dL (ref 0.4–1.5)
GFR: 68.47 mL/min (ref >60.00)
Potassium: 3.8 mEq/L (ref 3.5–5.1)
Sodium: 139 mEq/L (ref 135–145)
Total Protein: 7.4 g/dL (ref 6.0–8.3)

## 2012-08-07 MED ORDER — RANITIDINE HCL 150 MG PO TABS: 150.0000 mg | ORAL_TABLET | Freq: Two times a day (BID) | ORAL | Status: DC

## 2012-08-07 NOTE — Assessment & Plan Note (Signed)
a1c today Pt watches diet carefully Hx of vasc dz

## 2012-08-07 NOTE — Assessment & Plan Note (Signed)
In good control-no med Disc goals for lipids and reasons to control them Rev labs with pt Rev low sat fat diet in detail

## 2012-08-07 NOTE — Assessment & Plan Note (Signed)
Using vapor system- imp in breathing Disc strategy for weaning off this

## 2012-08-07 NOTE — Progress Notes (Signed)
Subjective:    Patient ID: [name], male    DOB: April 24, 1940, 72 y.o.   MRN: [id number]  HPI Here for f/u of chronic health problems   Last visit was treated for boils under his arms- it improved  Not bothered with intense itching under arms -with occ knot  He did try some cortisone 1%  Wt is down 3 lb with bmi of 27 Stress lately- eating less -has good days and bad days  Seeing PT for shoulder  Working -that is physical   Using water vapor smoking system-- is doing ok - needs to get off of that   More frequent indigestion lately  Uses tums or rolaids  Happens at night  Epigastric burning and also some heartburn  Is on plavix  He takes acetaminophen and avoids nsaids   bp is stable today  No cp or palpitations or headaches or edema  No side effects to medicines  BP Readings from Last 3 Encounters:  08/07/12 134/68  07/31/12 140/82  07/17/12 130/56      Hyperglycemia  Lab Results  Component Value Date   HGBA1C 5.9* 11/26/2011     Hyperlipidemia Lab Results  Component Value Date   CHOL 141 11/27/2011   HDL 51 11/27/2011   LDLCALC 78 11/27/2011   TRIG 59 11/27/2011   CHOLHDL 2.8 11/27/2011    Patient Active Problem List   Diagnosis Date Noted  . Boils 04/03/2012  . Intertrigo 04/03/2012  . Pre-operative cardiovascular examination 11/03/2011  . Carotid artery disease 05/20/2011  . Grief reaction 05/07/2011  . Ischemic cardiomyopathy 01/13/2011  . Palpitations 11/10/2010  . Occlusion and stenosis of carotid artery without mention of cerebral infarction 04/02/2010  . CHEST PAIN 04/02/2010  . ANXIETY 12/12/2009  . HYPERTENSION, BENIGN 09/29/2008  . PSA, INCREASED 09/19/2008  . CHRONIC OBSTRUCTIVE PULMONARY DISEASE, ACUTE EXACERBATION 06/19/2007  . HYPERLIPIDEMIA 02/10/2007  . AMAUROSIS FUGAX 02/10/2007  . MYOCARDIAL INFARCTION, HX OF 02/10/2007  . CORONARY ARTERY DISEASE 02/10/2007  . PERIPHERAL VASCULAR DISEASE 02/10/2007  . HEMORRHOIDS 02/10/2007  .  EMPHYSEMA 02/10/2007  . COPD 02/10/2007  . OSTEOARTHRITIS 02/10/2007  . Smoker 02/10/2007  . PURE HYPERCHOLESTEROLEMIA 01/27/2007  . CORONARY ATHEROSCLEROSIS NATIVE CORONARY ARTERY 01/27/2007   Past Medical History  Diagnosis Date  . COPD (chronic obstructive pulmonary disease)   . CAD (coronary artery disease)     Ant MI 17  . HTN (hypertension)   . Hyperlipidemia   . Osteoarthritis   . Peripheral vascular disease   . Prostate cancer   . Carotid artery disease     Right CEA 2002 per [name]  . Myocardial infarction   . GERD (gastroesophageal reflux disease)     occ  . CHF (congestive heart failure)    Past Surgical History  Procedure Laterality Date  . Coronary stent placement  97, 2000, 2012    8 stents  . Doppler echocardiography  1995    neg  . Carotid endarterectomy  03-2000    right  . Colonoscopy  04-2001    polyp  . Cardiovascular stress test  12-2006  . Cardiac catheterization    . Vascular surgery    . Endarterectomy  11/24/2011    Procedure: ENDARTERECTOMY CAROTID;  Surgeon: Larina Earthly, MD;  Location: [facility] OR;  Service: Vascular;  Laterality: Left;   History  Substance Use Topics  . Smoking status: Former Smoker -- 1.00 packs/day for 55 years    Types: Cigarettes    Quit date:  03/16/2012  . Smokeless tobacco: Never Used     Comment: currently using "water vapor" smoking system  . Alcohol Use: Yes     Comment: occ   Family History  Problem Relation Age of Onset  . Stroke Father   . Heart disease Mother     pacemaker  . Other Brother     benign brain tumor   Allergies  Allergen Reactions  . Augmentin (Amoxicillin-Pot Clavulanate) Nausea And Vomiting    Diarrhea  . Sulfonamide Derivatives    Current Outpatient Prescriptions on File Prior to Visit  Medication Sig Dispense Refill  . ALPRAZolam (XANAX) 0.5 MG tablet Take 0.5 mg by mouth at bedtime as needed. Sleep or anxiety      . aspirin 81 MG tablet Take 81 mg by mouth daily.        .  clopidogrel (PLAVIX) 75 MG tablet Take 1 tablet (75 mg total) by mouth daily.  30 tablet  2  . loratadine (CLARITIN) 10 MG tablet Take 10 mg by mouth daily.      Marland Kitchen losartan (COZAAR) 50 MG tablet TAKE 1 TABLET (50 MG TOTAL) BY MOUTH DAILY.  30 tablet  9  . metoprolol tartrate (LOPRESSOR) 25 MG tablet TAKE 1/2 TABLET TWICE A DAY  30 tablet  3  . nitroGLYCERIN (NITROSTAT) 0.4 MG SL tablet Place 0.4 mg under the tongue every 5 (five) minutes as needed. For chest pain      . SPIRIVA HANDIHALER 18 MCG inhalation capsule INHALE 1 CAPSULE VIA HANDIHALER ONCE DAILY AT THE SAME TIME EVERY DAY  30 each  6  . Tamsulosin HCl (FLOMAX) 0.4 MG CAPS Take 0.4 mg by mouth daily.       . traMADol (ULTRAM) 50 MG tablet Take 1 tablet (50 mg total) by mouth every 6 (six) hours as needed for pain.  50 tablet  2  . VENTOLIN HFA 108 (90 BASE) MCG/ACT inhaler INHALE 2 PUFFS INTO THE LUNGS EVERY 4 (FOUR) HOURS AS NEEDED FOR WHEEZING OR SHORTNESS OF BREATH.  18 each  6   No current facility-administered medications on file prior to visit.    Review of Systems Review of Systems  Constitutional: Negative for fever, appetite change, fatigue and unexpected weight change.  Eyes: Negative for pain and visual disturbance.  Respiratory: Negative for cough and pos for sob baseline (that is improved) Cardiovascular: Negative for cp or palpitations    Gastrointestinal: Negative for nausea, diarrhea and constipation. pos for heartburn and indigestion Genitourinary: Negative for urgency and frequency.  Skin: Negative for pallor or rash   Neurological: Negative for weakness, light-headedness, numbness and headaches.  Hematological: Negative for adenopathy. Does not bruise/bleed easily.  Psychiatric/Behavioral: Negative for dysphoric mood. The patient is not nervous/anxious.         Objective:   Physical Exam  Constitutional: He appears well-developed and well-nourished. No distress.  HENT:  Head: Normocephalic and atraumatic.   Mouth/Throat: Oropharynx is clear and moist.  Eyes: Conjunctivae and EOM are normal. Pupils are equal, round, and reactive to light. Right eye exhibits no discharge. Left eye exhibits no discharge. No scleral icterus.  Neck: Normal range of motion. Neck supple. No JVD present. Carotid bruit is not present. No thyromegaly present.  Cardiovascular: Normal rate, regular rhythm, normal heart sounds and intact distal pulses.  Exam reveals no gallop.   Pulmonary/Chest: Effort normal and breath sounds normal. No respiratory distress. He has no wheezes.  Abdominal: Soft. Bowel sounds are normal. He exhibits no  distension, no abdominal bruit and no mass. There is no tenderness. There is no rebound and no guarding.  Musculoskeletal: He exhibits no edema and no tenderness.  Lymphadenopathy:    He has no cervical adenopathy.  Neurological: He is alert. He has normal reflexes. No cranial nerve deficit. He exhibits normal muscle tone. Coordination normal.  Skin: Skin is warm and dry. No rash noted. No erythema. No pallor.  Psychiatric: He has a normal mood and affect.          Assessment & Plan:

## 2012-08-07 NOTE — Assessment & Plan Note (Signed)
bp in fair control at this time  No changes needed  Disc lifstyle change with low sodium diet and exercise  Lab today 

## 2012-08-07 NOTE — Patient Instructions (Addendum)
Watch diet and cut caffeine  Try zantac generic twice daily  Labs today  Follow up in 6 months for annual exam with labs prior

## 2012-08-07 NOTE — Assessment & Plan Note (Signed)
Poss early gerd Handout on what to avoid- diet and med wise  Px for zantac 150 bid  Update if not starting to improve in a week or if worsening

## 2012-08-08 ENCOUNTER — Encounter: Payer: Self-pay | Admitting: *Deleted

## 2012-08-12 ENCOUNTER — Other Ambulatory Visit: Payer: Self-pay | Admitting: Family Medicine

## 2012-08-28 ENCOUNTER — Encounter: Payer: Self-pay | Admitting: Family Medicine

## 2012-08-28 ENCOUNTER — Ambulatory Visit (INDEPENDENT_AMBULATORY_CARE_PROVIDER_SITE_OTHER): Payer: Medicare Other | Admitting: Family Medicine

## 2012-08-28 ENCOUNTER — Encounter: Payer: Self-pay | Admitting: Gastroenterology

## 2012-08-28 ENCOUNTER — Telehealth: Payer: Self-pay | Admitting: Family Medicine

## 2012-08-28 VITALS — BP 134/70 | HR 88 | Temp 98.3°F | Ht 71.0 in | Wt 193.0 lb

## 2012-08-28 DIAGNOSIS — Z860100 Personal history of colon polyps, unspecified: Secondary | ICD-10-CM

## 2012-08-28 DIAGNOSIS — R197 Diarrhea, unspecified: Secondary | ICD-10-CM

## 2012-08-28 NOTE — Assessment & Plan Note (Signed)
Due for colonoscopy recall  Will refer for that

## 2012-08-28 NOTE — Telephone Encounter (Signed)
Will see him then 

## 2012-08-28 NOTE — Assessment & Plan Note (Signed)
This started with the initiation of zantac and has improved since stopping it -so I think it was a side eff Fortunately - indigestion symptoms are gone(disc diet)  Will stay off zantac and watch it  Is also due for colonoscopy- will get that scheduled for surv of polyps (5 year recall) Update if not starting to improve in a week or if worsening  - if necessary will do stool studies  Of note-no risk factors for c diff

## 2012-08-28 NOTE — Telephone Encounter (Signed)
Patient Information:  Caller Name: Romaine  Phone: [phone]  Patient: [name], [name]  Gender: Male  DOB: 1941/01/29  Age: 72 Years  PCP: [facility], [place] (Family Practice)  Office Follow Up:  Does the office need to follow up with this patient?: No  Instructions For The Office: N/A   Symptoms  Reason For Call & Symptoms: Pt is calling and states that on 08/03/12 was seen in the office and started on Zantac for indigestion sx; since starting Zantac started to have loose BM;  today on 08/28/12 noticed mucus in the stool;  mucus noted 2-3 times in the sttols in the last 203 weeks;  no vomiting; no fever; no pain;  Reviewed Health History In EMR: Yes  Reviewed Medications In EMR: Yes  Reviewed Allergies In EMR: Yes  Reviewed Surgeries / Procedures: Yes  Date of Onset of Symptoms: 08/05/2012  Treatments Tried: Immodium  Treatments Tried Worked: No  Guideline(s) Used:  Diarrhea  Disposition Per Guideline:   See Today in Office  Reason For Disposition Reached:   Mucus or pus in stool has been present > 2 days and diarrhea is more than mild  Advice Given:  Call Back If:  You become worse.  Patient Will Follow Care Advice:  YES  Appointment Scheduled:  08/28/2012 15:45:00 Appointment Scheduled Provider:  [name] [facility])

## 2012-08-28 NOTE — Progress Notes (Signed)
Subjective:    Patient ID: [name], male    DOB: 1940-08-12, 72 y.o.   MRN: [id number]  HPI Here for stool changes   May 15th- was here for indigestion  Started zantac at that time 150mg  bid  Now having bm at least 4 times per day  Mucous in stool more recently  Took 2 immodium over the weekend- and that helped   Zantac did help the indigestion but he stopped it over a week ago  Diarrhea has slowed down some - ? If it came from the zantac  No black stools     no pain No fever  2 d ago has a trace of blood -thinks from his hemorrhoids   He is overdue for colonoscopy  He has a problem drinking enough fluid to complete    Patient Active Problem List   Diagnosis Date Noted  . Indigestion 08/07/2012  . Hyperglycemia 08/07/2012  . Boils 04/03/2012  . Intertrigo 04/03/2012  . Pre-operative cardiovascular examination 11/03/2011  . Carotid artery disease 05/20/2011  . Grief reaction 05/07/2011  . Ischemic cardiomyopathy 01/13/2011  . Palpitations 11/10/2010  . Occlusion and stenosis of carotid artery without mention of cerebral infarction 04/02/2010  . CHEST PAIN 04/02/2010  . ANXIETY 12/12/2009  . HYPERTENSION, BENIGN 09/29/2008  . PSA, INCREASED 09/19/2008  . CHRONIC OBSTRUCTIVE PULMONARY DISEASE, ACUTE EXACERBATION 06/19/2007  . HYPERLIPIDEMIA 02/10/2007  . AMAUROSIS FUGAX 02/10/2007  . MYOCARDIAL INFARCTION, HX OF 02/10/2007  . CORONARY ARTERY DISEASE 02/10/2007  . PERIPHERAL VASCULAR DISEASE 02/10/2007  . HEMORRHOIDS 02/10/2007  . EMPHYSEMA 02/10/2007  . COPD 02/10/2007  . OSTEOARTHRITIS 02/10/2007  . Nicotine dependence 02/10/2007  . PURE HYPERCHOLESTEROLEMIA 01/27/2007  . CORONARY ATHEROSCLEROSIS NATIVE CORONARY ARTERY 01/27/2007   Past Medical History  Diagnosis Date  . COPD (chronic obstructive pulmonary disease)   . CAD (coronary artery disease)     Ant MI 16  . HTN (hypertension)   . Hyperlipidemia   . Osteoarthritis   . Peripheral vascular  disease   . Prostate cancer   . Carotid artery disease     Right CEA 2002 per [name]  . Myocardial infarction   . GERD (gastroesophageal reflux disease)     occ  . CHF (congestive heart failure)    Past Surgical History  Procedure Laterality Date  . Coronary stent placement  97, 2000, 2012    8 stents  . Doppler echocardiography  1995    neg  . Carotid endarterectomy  03-2000    right  . Colonoscopy  04-2001    polyp  . Cardiovascular stress test  12-2006  . Cardiac catheterization    . Vascular surgery    . Endarterectomy  11/24/2011    Procedure: ENDARTERECTOMY CAROTID;  Surgeon: Larina Earthly, MD;  Location: [facility] OR;  Service: Vascular;  Laterality: Left;   History  Substance Use Topics  . Smoking status: Former Smoker -- 1.00 packs/day for 55 years    Types: Cigarettes    Quit date: 03/16/2012  . Smokeless tobacco: Never Used     Comment: currently using "water vapor" smoking system  . Alcohol Use: Yes     Comment: occ   Family History  Problem Relation Age of Onset  . Stroke Father   . Heart disease Mother     pacemaker  . Other Brother     benign brain tumor   Allergies  Allergen Reactions  . Augmentin (Amoxicillin-Pot Clavulanate) Nausea And Vomiting    Diarrhea  .  Sulfonamide Derivatives    Current Outpatient Prescriptions on File Prior to Visit  Medication Sig Dispense Refill  . ADVAIR DISKUS 500-50 MCG/DOSE AEPB INHALE 1 PUFF INTO THE LUNGS EVERY 12 (TWELVE) HOURS.  60 each  1  . ALPRAZolam (XANAX) 0.5 MG tablet Take 0.5 mg by mouth at bedtime as needed. Sleep or anxiety      . aspirin 81 MG tablet Take 81 mg by mouth daily.       . clopidogrel (PLAVIX) 75 MG tablet Take 1 tablet (75 mg total) by mouth daily.  30 tablet  2  . loratadine (CLARITIN) 10 MG tablet Take 10 mg by mouth daily.      Marland Kitchen losartan (COZAAR) 50 MG tablet TAKE 1 TABLET (50 MG TOTAL) BY MOUTH DAILY.  30 tablet  9  . metoprolol tartrate (LOPRESSOR) 25 MG tablet TAKE 1/2 TABLET TWICE A  DAY  30 tablet  3  . nitroGLYCERIN (NITROSTAT) 0.4 MG SL tablet Place 0.4 mg under the tongue every 5 (five) minutes as needed. For chest pain      . SPIRIVA HANDIHALER 18 MCG inhalation capsule INHALE 1 CAPSULE VIA HANDIHALER ONCE DAILY AT THE SAME TIME EVERY DAY  30 each  6  . Tamsulosin HCl (FLOMAX) 0.4 MG CAPS Take 0.4 mg by mouth daily.       . traMADol (ULTRAM) 50 MG tablet Take 1 tablet (50 mg total) by mouth every 6 (six) hours as needed for pain.  50 tablet  2  . VENTOLIN HFA 108 (90 BASE) MCG/ACT inhaler INHALE 2 PUFFS INTO THE LUNGS EVERY 4 (FOUR) HOURS AS NEEDED FOR WHEEZING OR SHORTNESS OF BREATH.  18 each  6  . ranitidine (ZANTAC) 150 MG tablet Take 1 tablet (150 mg total) by mouth 2 (two) times daily.  60 tablet  11   No current facility-administered medications on file prior to visit.     Review of Systems Review of Systems  Constitutional: Negative for fever, appetite change, fatigue and unexpected weight change.  Eyes: Negative for pain and visual disturbance.  Respiratory: Negative for cough and shortness of breath.   Cardiovascular: Negative for cp or palpitations    Gastrointestinal: Negative for nausea, vomiting/ abd pain or black stool.  Genitourinary: Negative for urgency and frequency.  Skin: Negative for pallor or rash   Neurological: Negative for weakness, light-headedness, numbness and headaches.  Hematological: Negative for adenopathy. Does not bruise/bleed easily.  Psychiatric/Behavioral: Negative for dysphoric mood. The patient is not nervous/anxious.         Objective:   Physical Exam  Constitutional: He appears well-developed and well-nourished. No distress.  HENT:  Head: Normocephalic and atraumatic.  Mouth/Throat: Oropharynx is clear and moist.  Eyes: Conjunctivae and EOM are normal. Pupils are equal, round, and reactive to light.  Neck: Normal range of motion. Neck supple. No thyromegaly present.  Cardiovascular: Normal rate and regular rhythm.    Pulmonary/Chest: Effort normal and breath sounds normal.  Abdominal: Soft. Bowel sounds are normal. He exhibits no distension and no mass. There is no tenderness. There is no rebound and no guarding.  Musculoskeletal: He exhibits no edema.  Lymphadenopathy:    He has no cervical adenopathy.  Neurological: He is alert. He has normal reflexes.  Skin: Skin is warm. No erythema. No pallor.  Psychiatric: He has a normal mood and affect.          Assessment & Plan:

## 2012-08-28 NOTE — Patient Instructions (Addendum)
Keep up a good fluid intake and if diarrhea is not improving further in another week let me know  If abdominal pain or fever let me know  See Shirlee Limerick on the way out for colonoscopy referral  No more zantac -I think this may have caused your diarrhea If your indigestion returned let me know

## 2012-08-29 ENCOUNTER — Other Ambulatory Visit: Payer: Self-pay | Admitting: Emergency Medicine

## 2012-09-04 ENCOUNTER — Encounter: Payer: Self-pay | Admitting: Family Medicine

## 2012-09-18 ENCOUNTER — Ambulatory Visit: Payer: Medicare Other | Admitting: Family Medicine

## 2012-09-18 ENCOUNTER — Other Ambulatory Visit: Payer: Self-pay | Admitting: Emergency Medicine

## 2012-09-25 ENCOUNTER — Ambulatory Visit (INDEPENDENT_AMBULATORY_CARE_PROVIDER_SITE_OTHER): Payer: Medicare Other | Admitting: Gastroenterology

## 2012-09-25 ENCOUNTER — Telehealth: Payer: Self-pay

## 2012-09-25 ENCOUNTER — Encounter: Payer: Self-pay | Admitting: Gastroenterology

## 2012-09-25 VITALS — BP 90/50 | HR 100 | Ht 68.5 in | Wt 192.2 lb

## 2012-09-25 DIAGNOSIS — I2510 Atherosclerotic heart disease of native coronary artery without angina pectoris: Secondary | ICD-10-CM

## 2012-09-25 DIAGNOSIS — Z1211 Encounter for screening for malignant neoplasm of colon: Secondary | ICD-10-CM

## 2012-09-25 DIAGNOSIS — K219 Gastro-esophageal reflux disease without esophagitis: Secondary | ICD-10-CM

## 2012-09-25 DIAGNOSIS — I959 Hypotension, unspecified: Secondary | ICD-10-CM

## 2012-09-25 MED ORDER — NA SULFATE-K SULFATE-MG SULF 17.5-3.13-1.6 GM/177ML PO SOLN: 1.0000 | Freq: Once | ORAL | Status: AC

## 2012-09-25 MED ORDER — PANTOPRAZOLE SODIUM 40 MG PO TBEC: 40.0000 mg | DELAYED_RELEASE_TABLET | Freq: Every day | ORAL | Status: DC

## 2012-09-25 NOTE — Telephone Encounter (Signed)
09/25/2012    RE: [name] DOB: 12-14-1940 MRN: [id number]   Dear Dr. Clifton James,    We have scheduled the above patient for an endoscopic procedure. Our records show that he is on anticoagulation therapy.   Please advise as to how long the patient may come off his therapy of Plavix prior to the procedure, which is scheduled for 10/09/12.  Please fax back/ or route the completed form to [facility] at [phone].   Sincerely,  Christie Nottingham, CMA  Please respond by 09/29/12 and route back to Christie Nottingham, CMA

## 2012-09-25 NOTE — Patient Instructions (Addendum)
You have been given a separate informational sheet regarding your tobacco use, the importance of quitting and local resources to help you quit.  We have sent the following medications to your pharmacy for you to pick up at your convenience: Pantoprazole.  Patient advised to avoid spicy, acidic, citrus, chocolate, mints, fruit and fruit juices.  Limit the intake of caffeine, alcohol and Soda.  Don't exercise too soon after eating.  Don't lie down within 3-4 hours of eating.  Elevate the head of your bed.   We have scheduled you an appointment to see your PCP Dr. Milinda Antis for your low blood pressure on 09/27/12 at 2:15pm. If you need to reschedule or cancel then please call there office to do so.   You have been scheduled for a colonoscopy with propofol. Please follow written instructions given to you at your visit today.  Please pick up your prep kit at the pharmacy within the next 1-3 days. If you use inhalers (even only as needed), please bring them with you on the day of your procedure. Your physician has requested that you go to www.startemmi.com and enter the access code given to you at your visit today. This web site gives a general overview about your procedure. However, you should still follow specific instructions given to you by our office regarding your preparation for the procedure.  You will be contacted by our office prior to your procedure for directions on holding your Plavix.  If you do not hear from our office 1 week prior to your scheduled procedure, please call [phone] to discuss.   Thank you for choosing me and [facility].  Venita Lick. Pleas Koch., MD., Clementeen Graham

## 2012-09-25 NOTE — Progress Notes (Signed)
History of Present Illness: This is a 72 year old male who returns for colorectal cancer screening. He is maintained on Plavix with a history of 8 coronary artery stents and 2 carotid endarterectomies. His most recent cardiovascular procedure was a carotid endarterectomy in September 2013. He notes nighttime heartburn symptoms occurring 2-3 times per week. TUMS have not been adequate to control symptoms. He was started on Zantac which led to diarrhea and mucus. These symptoms resolved as soon as his Zantac was discontinued. He previously underwent colonoscopy in 2003 with findings of a hyperplastic polyp and internal hemorrhoids that were injected. He notes feeling mildly dizzy for the past 2-3 days. Denies weight loss, abdominal pain, constipation, diarrhea, change in stool caliber, melena, hematochezia, nausea, vomiting, dysphagia, reflux symptoms, chest pain.  Review of Systems: Pertinent positive and negative review of systems were noted in the above HPI section. All other review of systems were otherwise negative.  Current Medications, Allergies, Past Medical History, Past Surgical History, Family History and Social History were reviewed in [employer] record.  Physical Exam: General: Well developed , well nourished, no acute distress Head: Normocephalic and atraumatic Eyes:  sclerae anicteric, EOMI Ears: Normal auditory acuity Mouth: No deformity or lesions Neck: Supple, no masses or thyromegaly Lungs: Expiratory wheezes throughout to auscultation Heart: Regular rate and rhythm; no murmurs, rubs or bruits Abdomen: Soft, non tender and non distended. No masses, hepatosplenomegaly or hernias noted. Normal Bowel sounds Rectal: deferred to colonoscopy Musculoskeletal: Symmetrical with no gross deformities  Skin: No lesions on visible extremities Pulses:  Normal pulses noted Extremities: No clubbing, cyanosis, edema or deformities noted Neurological: Alert oriented x 4,  grossly nonfocal Cervical Nodes:  No significant cervical adenopathy Inguinal Nodes: No significant inguinal adenopathy Psychological:  Alert and cooperative. Normal mood and affect  Assessment and Recommendations:  1. Colorectal cancer screening. The risks, benefits, and alternatives to colonoscopy with possible biopsy and possible polypectomy were discussed with the patient and they consent to proceed. Pt requests lower volume prep and something for nausea. Suprep with Reglan before doses.  2. Coronary artery disease with 8 stents on Plavix. Previous carotid endarterectomies. Risks, benefits and alternatives to 5 day hold of Plavix discussed with pt and he consents to proceed. Clearance from his Cardiologist.   3. GERD. Pantoprazole 40 mg daily and antireflux measures. Had diarrhea from Zantac.  4. COPD with expiratory wheezes. Evaluation by Dr. Milinda Antis.  5. Borderline low BP, 90/50, with mild dizziness. Evaluation by Dr. Milinda Antis.

## 2012-09-26 NOTE — Telephone Encounter (Signed)
OK to hold Plavix 5-7 days before the planned procedure and resume safe from a procedural standpoint. He is now 30 months out from last stents. Earney Hamburg

## 2012-09-26 NOTE — Telephone Encounter (Signed)
Patient informed that he can hold Plavix 5 days prior to his procedure per Dr. Clifton James. Pt verbalized understanding.

## 2012-09-27 ENCOUNTER — Ambulatory Visit: Payer: Medicare Other | Admitting: Family Medicine

## 2012-09-28 ENCOUNTER — Ambulatory Visit (INDEPENDENT_AMBULATORY_CARE_PROVIDER_SITE_OTHER): Payer: Medicare Other | Admitting: Family Medicine

## 2012-09-28 ENCOUNTER — Encounter: Payer: Self-pay | Admitting: Family Medicine

## 2012-09-28 VITALS — BP 100/72 | HR 85 | Temp 97.6°F | Ht 68.5 in | Wt 192.5 lb

## 2012-09-28 DIAGNOSIS — M75110 Incomplete rotator cuff tear or rupture of unspecified shoulder, not specified as traumatic: Secondary | ICD-10-CM

## 2012-09-28 DIAGNOSIS — M25512 Pain in left shoulder: Secondary | ICD-10-CM

## 2012-09-28 DIAGNOSIS — M25519 Pain in unspecified shoulder: Secondary | ICD-10-CM

## 2012-09-28 NOTE — Progress Notes (Signed)
[facility] [address] [place] [address] Phone: [phone] Fax: [phone]  Date:  09/28/2012   Name:  [name]   DOB:  Oct 25, 1940   MRN:  [id number] Gender: male Age: 72 y.o.  Primary Physician:  [name], MD  Evaluating MD: Hannah Beat, MD   Chief Complaint: Follow-up   History of Present Illness:  [name] is a 72 y.o. pleasant patient who presents with the following:  F/u L shoulder:  rom better 80% better He has been very compliant with his rehab, and he feels a lot better  Patient Active Problem List   Diagnosis Date Noted  . Diarrhea 08/28/2012  . Personal history of colonic polyps 08/28/2012  . Hyperglycemia 08/07/2012  . Boils 04/03/2012  . Intertrigo 04/03/2012  . Pre-operative cardiovascular examination 11/03/2011  . Carotid artery disease 05/20/2011  . Grief reaction 05/07/2011  . Ischemic cardiomyopathy 01/13/2011  . Palpitations 11/10/2010  . Occlusion and stenosis of carotid artery without mention of cerebral infarction 04/02/2010  . CHEST PAIN 04/02/2010  . ANXIETY 12/12/2009  . HYPERTENSION, BENIGN 09/29/2008  . PSA, INCREASED 09/19/2008  . CHRONIC OBSTRUCTIVE PULMONARY DISEASE, ACUTE EXACERBATION 06/19/2007  . HYPERLIPIDEMIA 02/10/2007  . AMAUROSIS FUGAX 02/10/2007  . MYOCARDIAL INFARCTION, HX OF 02/10/2007  . CORONARY ARTERY DISEASE 02/10/2007  . PERIPHERAL VASCULAR DISEASE 02/10/2007  . HEMORRHOIDS 02/10/2007  . EMPHYSEMA 02/10/2007  . COPD 02/10/2007  . OSTEOARTHRITIS 02/10/2007  . Nicotine dependence 02/10/2007  . PURE HYPERCHOLESTEROLEMIA 01/27/2007  . CORONARY ATHEROSCLEROSIS NATIVE CORONARY ARTERY 01/27/2007    Past Medical History  Diagnosis Date  . COPD (chronic obstructive pulmonary disease)   . CAD (coronary artery disease)     Ant MI 10  . HTN (hypertension)   . Hyperlipidemia   . Osteoarthritis   . Peripheral vascular disease   . Prostate cancer   . Carotid  artery disease     Right CEA 2002 per [name]  . Myocardial infarction   . GERD (gastroesophageal reflux disease)     occ  . CHF (congestive heart failure)     pt denies 09/25/12  . Anxiety   . PAC (premature atrial contraction)   . Pneumonia   . Stroke     Past Surgical History  Procedure Laterality Date  . Coronary stent placement  97, 2000, 2012    8 stents  . Doppler echocardiography  1995    neg  . Carotid endarterectomy  03-2000    right  . Colonoscopy  04-2001    polyp  . Cardiovascular stress test  12-2006  . Cardiac catheterization    . Vascular surgery    . Endarterectomy  11/24/2011    Procedure: ENDARTERECTOMY CAROTID;  Surgeon: Larina Earthly, MD;  Location: [facility] OR;  Service: Vascular;  Laterality: Left;    History   Social History  . Marital Status: Widowed    Spouse Name: N/A    Number of Children: 5  . Years of Education: N/A   Occupational History  . firefighter   .     Social History Main Topics  . Smoking status: Current Every Day Smoker -- 1.00 packs/day for 55 years    Types: Cigarettes  . Smokeless tobacco: Never Used     Comment: currently using "water vapor" smoking system  . Alcohol Use: Yes     Comment: occ  . Drug Use: No  . Sexually Active: Not on file   Other Topics  Concern  . Not on file   Social History Narrative  . No narrative on file    Family History  Problem Relation Age of Onset  . Stroke Father   . Heart disease Mother     pacemaker  . Other Brother     benign brain tumor  . Liver cancer Brother   . Diabetes Maternal Grandmother     Allergies  Allergen Reactions  . Augmentin (Amoxicillin-Pot Clavulanate) Nausea And Vomiting    Diarrhea  . Sulfonamide Derivatives   . Zantac (Ranitidine Hcl)     diarrhea    Medication list has been reviewed and updated.  Outpatient Prescriptions Prior to Visit  Medication Sig Dispense Refill  . ADVAIR DISKUS 500-50 MCG/DOSE AEPB INHALE 1 PUFF INTO THE LUNGS EVERY 12  (TWELVE) HOURS.  60 each  1  . ALPRAZolam (XANAX) 0.5 MG tablet Take 0.5 mg by mouth at bedtime as needed. Sleep or anxiety      . aspirin 81 MG tablet Take 81 mg by mouth daily.       . clopidogrel (PLAVIX) 75 MG tablet Take 1 tablet (75 mg total) by mouth daily.  30 tablet  2  . loratadine (CLARITIN) 10 MG tablet Take 10 mg by mouth daily.      . Na Sulfate-K Sulfate-Mg Sulf SOLN Take 1 kit by mouth once.  354 mL  0  . nitroGLYCERIN (NITROSTAT) 0.4 MG SL tablet Place 0.4 mg under the tongue every 5 (five) minutes as needed. For chest pain      . pantoprazole (PROTONIX) 40 MG tablet Take 1 tablet (40 mg total) by mouth daily.  90 tablet  3  . SPIRIVA HANDIHALER 18 MCG inhalation capsule INHALE 1 CAPSULE VIA HANDIHALER ONCE DAILY AT THE SAME TIME EVERY DAY  30 capsule  6  . Tamsulosin HCl (FLOMAX) 0.4 MG CAPS Take 0.4 mg by mouth daily.       . traMADol (ULTRAM) 50 MG tablet Take 1 tablet (50 mg total) by mouth every 6 (six) hours as needed for pain.  50 tablet  2  . VENTOLIN HFA 108 (90 BASE) MCG/ACT inhaler INHALE 2 PUFFS INTO THE LUNGS EVERY 4 (FOUR) HOURS AS NEEDED FOR WHEEZING OR SHORTNESS OF BREATH.  18 each  6  . losartan (COZAAR) 50 MG tablet TAKE 1 TABLET (50 MG TOTAL) BY MOUTH DAILY.  30 tablet  9  . metoprolol tartrate (LOPRESSOR) 25 MG tablet TAKE 1/2 TABLET TWICE A DAY  30 tablet  3   No facility-administered medications prior to visit.    Review of Systems:   GEN: No fevers, chills. Nontoxic. Primarily MSK c/o today. MSK: Detailed in the HPI GI: tolerating PO intake without difficulty Neuro: No numbness, parasthesias, or tingling associated. Otherwise the pertinent positives of the ROS are noted above.    Physical Examination: BP 100/72  Pulse 85  Temp(Src) 97.6 F (36.4 C) (Oral)  Ht 5' 8.5" (1.74 m)  Wt 192 lb 8 oz (87.317 kg)  BMI 28.84 kg/m2  SpO2 95%  Ideal Body Weight: Weight in (lb) to have BMI = 25: 166.5   GEN: WDWN, NAD, Non-toxic, Alert & Oriented x  3 HEENT: Atraumatic, Normocephalic.  Ears and Nose: No external deformity. EXTR: No clubbing/cyanosis/edema NEURO: Normal gait.  PSYCH: Normally interactive. Conversant. Not depressed or anxious appearing.  Calm demeanor.   Shoulder: L Inspection: No muscle wasting or winging Ecchymosis/edema: neg  AC joint, scapula, clavicle: NT Cervical spine: NT, full  ROM Spurling's: neg Abduction: full, 5/5 Flexion: full, 5/5 IR, full, lift-off: 5/5 ER at neutral: full, 5/5 AC crossover and compression: neg Neer: neg Hawkins: mild pos Drop Test: neg Empty Can: neg Supraspinatus insertion: NT Bicipital groove: NT Speed's: neg Yergason's: neg Sulcus sign: neg Scapular dyskinesis: none C5-T1 intact Sensation intact Grip 5/5   Assessment and Plan: Partial tear of rotator cuff(726.13)  Pain in joint, shoulder region, left   Looking a lot better. Recheck in 6 weeks, cont HEP  Signed, Marquette Blodgett T. Gustave Lindeman, MD 09/28/2012 8:27 AM

## 2012-10-09 ENCOUNTER — Encounter: Payer: Medicare Other | Admitting: Gastroenterology

## 2012-10-15 ENCOUNTER — Other Ambulatory Visit: Payer: Self-pay | Admitting: Family Medicine

## 2012-11-17 ENCOUNTER — Encounter: Payer: Self-pay | Admitting: Emergency Medicine

## 2012-11-17 ENCOUNTER — Ambulatory Visit (INDEPENDENT_AMBULATORY_CARE_PROVIDER_SITE_OTHER): Payer: Medicare Other | Admitting: Emergency Medicine

## 2012-11-17 VITALS — BP 140/72 | HR 76 | Ht 70.0 in | Wt 193.8 lb

## 2012-11-17 DIAGNOSIS — J449 Chronic obstructive pulmonary disease, unspecified: Secondary | ICD-10-CM

## 2012-11-17 NOTE — Assessment & Plan Note (Signed)
-   continue current BD's - flu shot in the fall - rov4

## 2012-11-17 NOTE — Progress Notes (Signed)
Subjective:  Patient ID: [name], male    DOB: 1940/10/20, 72 y.o.   MRN: [id number] HPI 72 yo smoker (~100pk-yrs), hx of CAD/PTCI, HTN,  prostate CA. Dx with COPD around 10 yrs ago. Last seen 2007 by PW. Maintanance was Advair bid. Previously on Spiriva, stopped in 2008.   ROV 12/29/10 -- Severe COPD, CAD (PTCI by Dr [name]). Walking oximetry last visit showed desats but he didn't want to start O2. Tells me that he was seen by Dr [name] last week, exertional SOB, CP, weakness. He underwent L cath with PTCI. He reports continued DOE, but CP has resolved, weakness is better. Last time we did experiment changing Advair to Symbicort - he believes that it is at least as good, not necessarily better. Still on Spiriva. He has not restarted quinipril. We treated him prednisone after his last visit here, he says it helped his stamina, may have made breathing better. Smoking 5-10 cig a day.   ROV 03/08/11 -- Severe COPD, documented hypoxemia but has not wanted O2. Had been well until developed URI about 2 weeks ago. Has had progressive SOB, wheeze, cough. Prod of green/brown, sometimes clear. He stopped smoking about 1 weeks ago.  Remains on Spiriva, went back to Advair.   ROV 04/08/11 -- Severe COPD, documented hypoxemia but has not wanted O2. Reports that he has restarted smoking - about 1/3 of a pack daily. Did not tolerate Chantix. He is improved after rx with doxy and pred 1 months ago. Some cough, less freq - clear sputum. Remains on Spiriva and Advair 500/50.   ROV 07/14/11 -- Severe COPD, documented hypoxemia but has not wanted O2. Remains on Spiriva and Advair 500/50. DuoNebs prn, hasn't needed since last visit. Has been using albuterol HFA about once a day. Still smoking. Unfortunately his wife died 29-Apr-2022, quitting tobacco hasn't been possible yet. He wants to quit again, but not ready to set quit date. He is still working.   ROV 10/26/11 -- Hx CAD, Severe COPD, documented hypoxemia but has not  wanted O2. Remains on Spiriva and Advair 500/50. DuoNebs or albuterol prn. Still smoking 3/4 pk a day. Remains on Spiriva + Advair. No AE since last visit. Able to work part-time, has difficulty w yard work.  Planning for L carotid sgy with Dr [name]. Some increased cough last 3 -4 days, more mucous. No change in dyspnea or wheeze.   ROV 03/17/12 -- Hx CAD, Severe COPD, documented hypoxemia but has not wanted O2. Remains on Spiriva and Advair 500/50. DuoNebs or albuterol prn >> uses it 1 -2 x a day. 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 Has had some dry cough last few days.  He is starting the e-cig, hasn't had a cigarette in 2-3 days.   ROV 07/17/12 -- Hx CAD, Severe COPD, documented hypoxemia (not on O2). He has not smoked since last time - has instead been using e-cig. Discussed the pro's/con's of this. No flares, no bronchitis. He has some exertional wheeze. His cough has increased with the allergy season. He is on loratadine.   ROV 11/17/12 -- Hx CAD, Severe COPD, documented hypoxemia (not on O2). He restarted smoking about 2 months ago. He notes that the e cig irritated him and made cough. He is smoking 1/2 pk a day. Cough has been a bit more x 2 weeks. Productive of clear mucous, worst in the am. Still on Advair + Spiriva.  Uses albuterol 2-3x a day. Remains on  loratadine. He had tried chantix before > had side effects. Breathing is stable, some exertional SOB.    CAT Score 03/17/2012  Total CAT Score 10    Objective:  Physical Exam Filed Vitals:   11/17/12 0901  BP: 140/72  Pulse: 76   Gen: Pleasant, obese man, in no distress,  normal affect  ENT: No lesions,  mouth clear,  oropharynx clear, no postnasal drip  Neck: No JVD, no TMG, no carotid bruits  Lungs: No use of accessory muscles, no dullness to percussion, Coarse BS w/ few rhonchi, few scattered wheezes  Cardiovascular: RRR, heart sounds normal, no murmur or gallops, no peripheral  edema  Musculoskeletal: No deformities, no cyanosis or clubbing  Neuro: alert, non focal  Skin: Warm, no lesions or rashes   Assessment & Plan:  COPD - continue current BD's - flu shot in the fall - rov4

## 2012-11-17 NOTE — Patient Instructions (Addendum)
Please continue your current medications Continue to work on cutting down your smoking Get the Flu Shot this Fall Follow with Dr [name] in 4 months or sooner if you have any problems.

## 2012-11-29 ENCOUNTER — Encounter: Payer: Self-pay | Admitting: Family Medicine

## 2012-11-29 ENCOUNTER — Ambulatory Visit (INDEPENDENT_AMBULATORY_CARE_PROVIDER_SITE_OTHER): Payer: Medicare Other | Admitting: Family Medicine

## 2012-11-29 VITALS — BP 130/76 | HR 72 | Temp 98.3°F | Ht 68.5 in | Wt 192.0 lb

## 2012-11-29 DIAGNOSIS — M75110 Incomplete rotator cuff tear or rupture of unspecified shoulder, not specified as traumatic: Secondary | ICD-10-CM

## 2012-11-29 NOTE — Progress Notes (Signed)
[facility] [address] [place] [address] Phone: [phone] Fax: [phone]  Date:  11/29/2012   Name:  [name]   DOB:  September 18, 1940   MRN:  [id number] Gender: male Age: 72 y.o.  Primary Physician:  [name], MD  Evaluating MD: Hannah Beat, MD  Chief Complaint: Follow-up   History of Present Illness:  [name] is a 72 y.o. very pleasant male patient who presents with the following:  F/u L RTC partial tear: mostly better. Still now with some anterior ache, but at least 80-90% better. Str is perfect, ROM is normal.    Past Medical History, Surgical History, Social History, Family History, Problem List, Medications, and Allergies have been reviewed and updated if relevant.  Current Outpatient Prescriptions on File Prior to Visit  Medication Sig Dispense Refill  . ADVAIR DISKUS 500-50 MCG/DOSE AEPB INHALE 1 PUFF INTO THE LUNGS EVERY 12 (TWELVE) HOURS.  60 each  5  . ALPRAZolam (XANAX) 0.5 MG tablet Take 0.5 mg by mouth at bedtime as needed. Sleep or anxiety      . aspirin 81 MG tablet Take 81 mg by mouth daily.       . clopidogrel (PLAVIX) 75 MG tablet Take 1 tablet (75 mg total) by mouth daily.  30 tablet  2  . loratadine (CLARITIN) 10 MG tablet Take 10 mg by mouth daily.      . metoprolol tartrate (LOPRESSOR) 25 MG tablet TAKE 1/2 TABLET TWICE A DAY  30 tablet  3  . nitroGLYCERIN (NITROSTAT) 0.4 MG SL tablet Place 0.4 mg under the tongue every 5 (five) minutes as needed. For chest pain      . pantoprazole (PROTONIX) 40 MG tablet Take 40 mg by mouth daily as needed.      Marland Kitchen SPIRIVA HANDIHALER 18 MCG inhalation capsule INHALE 1 CAPSULE VIA HANDIHALER ONCE DAILY AT THE SAME TIME EVERY DAY  30 capsule  6  . Tamsulosin HCl (FLOMAX) 0.4 MG CAPS Take 0.4 mg by mouth daily.       . traMADol (ULTRAM) 50 MG tablet Take 1 tablet (50 mg total) by mouth every 6 (six) hours as needed for pain.  50 tablet  2  . VENTOLIN HFA 108 (90 BASE)  MCG/ACT inhaler INHALE 2 PUFFS INTO THE LUNGS EVERY 4 (FOUR) HOURS AS NEEDED FOR WHEEZING OR SHORTNESS OF BREATH.  18 each  6  . losartan (COZAAR) 50 MG tablet TAKE 1 TABLET (50 MG TOTAL) BY MOUTH DAILY.  30 tablet  9   No current facility-administered medications on file prior to visit.    Review of Systems:  GEN: No fevers, chills. Nontoxic. Primarily MSK c/o today. MSK: Detailed in the HPI GI: tolerating PO intake without difficulty Neuro: No numbness, parasthesias, or tingling associated. Otherwise the pertinent positives of the ROS are noted above.    Physical Examination: BP 130/76  Pulse 72  Temp(Src) 98.3 F (36.8 C) (Oral)  Ht 5' 8.5" (1.74 m)  Wt 192 lb (87.091 kg)  BMI 28.77 kg/m2   GEN: WDWN, NAD, Non-toxic, Alert & Oriented x 3 HEENT: Atraumatic, Normocephalic.  Ears and Nose: No external deformity. EXTR: No clubbing/cyanosis/edema NEURO: Normal gait.  PSYCH: Normally interactive. Conversant. Not depressed or anxious appearing.  Calm demeanor.   Shoulder: L Inspection: No muscle wasting or winging Ecchymosis/edema: neg  AC joint, scapula, clavicle: NT Cervical spine: NT, full ROM Spurling's: neg Abduction: full, 5/5 Flexion: full, 5/5 IR, full,  lift-off: 5/5 ER at neutral: full, 5/5 AC crossover and compression: neg Neer: neg Hawkins: neg Drop Test: neg Empty Can: neg Supraspinatus insertion: NT Bicipital groove: NT Speed's: neg Yergason's: neg Sulcus sign: neg Scapular dyskinesis: none C5-T1 intact Sensation intact Grip 5/5   Assessment and Plan: Partial thickness RTC tear, shoulder pain: mostly improved, cont HEP 3 days a week, then f/u prn.  Signed, Elpidio Galea. Zyara Riling, MD 11/29/2012 8:11 AM

## 2012-12-02 ENCOUNTER — Other Ambulatory Visit: Payer: Self-pay | Admitting: Cardiovascular Disease

## 2012-12-05 ENCOUNTER — Other Ambulatory Visit: Payer: Medicare Other

## 2012-12-05 ENCOUNTER — Ambulatory Visit: Payer: Medicare Other | Admitting: Vascular Surgery

## 2012-12-14 ENCOUNTER — Ambulatory Visit (INDEPENDENT_AMBULATORY_CARE_PROVIDER_SITE_OTHER): Payer: Medicare Other | Admitting: Cardiovascular Disease

## 2012-12-14 ENCOUNTER — Encounter: Payer: Self-pay | Admitting: Cardiovascular Disease

## 2012-12-14 VITALS — BP 124/76 | HR 80 | Ht 68.5 in | Wt 193.0 lb

## 2012-12-14 DIAGNOSIS — I779 Disorder of arteries and arterioles, unspecified: Secondary | ICD-10-CM

## 2012-12-14 DIAGNOSIS — F17200 Nicotine dependence, unspecified, uncomplicated: Secondary | ICD-10-CM

## 2012-12-14 DIAGNOSIS — Z720 Tobacco use: Secondary | ICD-10-CM

## 2012-12-14 DIAGNOSIS — I2510 Atherosclerotic heart disease of native coronary artery without angina pectoris: Secondary | ICD-10-CM

## 2012-12-14 MED ORDER — PRAVASTATIN SODIUM 40 MG PO TABS: ORAL_TABLET | ORAL | Status: DC

## 2012-12-14 NOTE — Patient Instructions (Addendum)
Your physician wants you to follow-up in:  6 months.  You will receive a reminder letter in the mail two months in advance. If you don't receive a letter, please call our office to schedule the follow-up appointment.  Your physician recommends that you return for fasting lab work in: 12 weeks. --Lipid and Liver profiles. To be done on March 06, 2013 at 8:00 at [facility] office. You have prescription for this.   Your physician has recommended you make the following change in your medication: Start Pravastatin  40 mg by mouth every other day

## 2012-12-14 NOTE — Progress Notes (Signed)
History of Present Illness: 72 yo male with history of CAD, COPD, HTN, HLD, GERD, PAD, carotid artery disease who is here today for cardiac follow up. His cardiac history is significant for anterior MI in 1997 tx'd with stent x 2 to the LAD, bare metal stenting to the CFX in 2002 and PCI in 2009 with placement of 2 separate drug eluting stents in the LAD.  Cardiac cath on 04/14/10 with severe stenosis in the distal AV groove Circumflex and the OM. These were both treated with DES. His carotid artery dopplers 06/06/12 in [facility] office showed mild disease in RICA at CEA site and mild disease in the LICA CES site. He is followed by [name] for COPD.   He is here for follow up. No exertional chest pain.  No SOB or palpitations. He stopped his Crestor due to cost. Stopped Losartan due to dizziness and hypotension.   [name]   Last Lipid Profile:Lipid Panel     Component Value Date/Time   CHOL 141 11/27/2011 0400   TRIG 59 11/27/2011 0400   HDL 51 11/27/2011 0400   CHOLHDL 2.8 11/27/2011 0400   VLDL 12 11/27/2011 0400   LDLCALC 78 11/27/2011 0400     Past Medical History  Diagnosis Date  . COPD (chronic obstructive pulmonary disease)   . CAD (coronary artery disease)     Ant MI 67  . HTN (hypertension)   . Hyperlipidemia   . Osteoarthritis   . Peripheral vascular disease   . Prostate cancer   . Carotid artery disease     Right CEA 2002 per [name]  . Myocardial infarction   . GERD (gastroesophageal reflux disease)     occ  . CHF (congestive heart failure)     pt denies 09/25/12  . Anxiety   . PAC (premature atrial contraction)   . Pneumonia   . Stroke     Past Surgical History  Procedure Laterality Date  . Coronary stent placement  97, 2000, 2012    8 stents  . Doppler echocardiography  1995    neg  . Carotid endarterectomy  03-2000    right  . Colonoscopy  04-2001    polyp  . Cardiovascular stress test  12-2006  . Cardiac catheterization    .  Vascular surgery    . Endarterectomy  11/24/2011    Procedure: ENDARTERECTOMY CAROTID;  Surgeon: Larina Earthly, MD;  Location: [facility] OR;  Service: Vascular;  Laterality: Left;    Current Outpatient Prescriptions  Medication Sig Dispense Refill  . ADVAIR DISKUS 500-50 MCG/DOSE AEPB INHALE 1 PUFF INTO THE LUNGS EVERY 12 (TWELVE) HOURS.  60 each  5  . ALPRAZolam (XANAX) 0.5 MG tablet Take 0.5 mg by mouth at bedtime as needed. Sleep or anxiety      . aspirin 81 MG tablet Take 81 mg by mouth daily.       . clopidogrel (PLAVIX) 75 MG tablet Take 1 tablet (75 mg total) by mouth daily.  30 tablet  2  . loratadine (CLARITIN) 10 MG tablet Take 10 mg by mouth daily.      . metoprolol tartrate (LOPRESSOR) 25 MG tablet TAKE 1/2 TABLET TWICE A DAY  30 tablet  3  . nitroGLYCERIN (NITROSTAT) 0.4 MG SL tablet Place 0.4 mg under the tongue every 5 (five) minutes as needed. For chest pain      . pantoprazole (PROTONIX) 40 MG tablet Take 40 mg by mouth daily  as needed.      Marland Kitchen SPIRIVA HANDIHALER 18 MCG inhalation capsule INHALE 1 CAPSULE VIA HANDIHALER ONCE DAILY AT THE SAME TIME EVERY DAY  30 capsule  6  . Tamsulosin HCl (FLOMAX) 0.4 MG CAPS Take 0.4 mg by mouth daily.       . traMADol (ULTRAM) 50 MG tablet Take 1 tablet (50 mg total) by mouth every 6 (six) hours as needed for pain.  50 tablet  2  . VENTOLIN HFA 108 (90 BASE) MCG/ACT inhaler INHALE 2 PUFFS INTO THE LUNGS EVERY 4 (FOUR) HOURS AS NEEDED FOR WHEEZING OR SHORTNESS OF BREATH.  18 each  6   No current facility-administered medications for this visit.    Allergies  Allergen Reactions  . Augmentin [Amoxicillin-Pot Clavulanate] Nausea And Vomiting    Diarrhea  . Sulfonamide Derivatives   . Zantac [Ranitidine Hcl]     diarrhea    History   Social History  . Marital Status: Widowed    Spouse Name: N/A    Number of Children: 5  . Years of Education: N/A   Occupational History  . firefighter   .     Social History Main Topics  . Smoking  status: Current Every Day Smoker -- 0.50 packs/day for 55 years    Types: Cigarettes  . Smokeless tobacco: Never Used  . Alcohol Use: Yes     Comment: occ  . Drug Use: No  . Sexual Activity: Not on file   Other Topics Concern  . Not on file   Social History Narrative  . No narrative on file    Family History  Problem Relation Age of Onset  . Stroke Father   . Heart disease Mother     pacemaker  . Other Brother     benign brain tumor  . Liver cancer Brother   . Diabetes Maternal Grandmother     Review of Systems:  As stated in the HPI and otherwise negative.   BP 124/76  Pulse 80  Ht 5' 8.5" (1.74 m)  Wt 193 lb (87.544 kg)  BMI 28.92 kg/m2  Physical Examination: General: Well developed, well nourished, NAD HEENT: OP clear, mucus membranes moist SKIN: warm, dry. No rashes. Neuro: No focal deficits Musculoskeletal: Muscle strength 5/5 all ext Psychiatric: Mood and affect normal Neck: No JVD, no carotid bruits, no thyromegaly, no lymphadenopathy. Lungs:Clear bilaterally, no wheezes, rhonci, crackles Cardiovascular: Regular rate and rhythm. No murmurs, gallops or rubs. Abdomen:Soft. Bowel sounds present. Non-tender.  Extremities: No lower extremity edema. Pulses are 2 + in the bilateral DP/PT.  Assessment and Plan:   1. CAD: Stable. He is on good medical therapy. No signs or symptoms of unstable angina. BP is well controlled. Lipids are well controlled.  No changes today.  Will continue dual anti-platelet therapy with ASA and Plavix since has multiple stents.  Will restart statin. Will use pravastatin 40 mg po every other night. Check lipids and LFTs in 3 months.     2. Carotid artery disease: Stable s/p bilateral CEA. Followed by [name]. Recent carotid dopplers 06/06/12 in [facility] office.   3. Tobacco abuse: Smoking cessation encouraged.

## 2012-12-18 ENCOUNTER — Encounter: Payer: Self-pay | Admitting: Family

## 2012-12-19 ENCOUNTER — Encounter: Payer: Self-pay | Admitting: Family

## 2012-12-19 ENCOUNTER — Ambulatory Visit (INDEPENDENT_AMBULATORY_CARE_PROVIDER_SITE_OTHER): Payer: Medicare Other | Admitting: Family

## 2012-12-19 ENCOUNTER — Ambulatory Visit (HOSPITAL_COMMUNITY)
Admission: RE | Admit: 2012-12-19 | Discharge: 2012-12-19 | Disposition: A | Discharge status: Home / Self Care | Payer: Medicare Other | Source: Ambulatory Visit | Attending: Vascular Surgery | Admitting: Vascular Surgery

## 2012-12-19 DIAGNOSIS — Z48812 Encounter for surgical aftercare following surgery on the circulatory system: Secondary | ICD-10-CM

## 2012-12-19 DIAGNOSIS — I6529 Occlusion and stenosis of unspecified carotid artery: Secondary | ICD-10-CM

## 2012-12-19 DIAGNOSIS — I739 Peripheral vascular disease, unspecified: Secondary | ICD-10-CM

## 2012-12-19 DIAGNOSIS — I70219 Atherosclerosis of native arteries of extremities with intermittent claudication, unspecified extremity: Secondary | ICD-10-CM

## 2012-12-19 NOTE — Patient Instructions (Addendum)
Stroke Prevention Some medical conditions and behaviors are associated with an increased chance of having a stroke. You may prevent a stroke by making healthy choices and managing medical conditions. Reduce your risk of having a stroke by:  Staying physically active. Get at least 30 minutes of activity on most or all days.  Not smoking. It may also be helpful to avoid exposure to secondhand smoke.  Limiting alcohol use. Moderate alcohol use is considered to be:  No more than 2 drinks per day for men.  No more than 1 drink per day for nonpregnant women.  Eating healthy foods.  Include 5 or more servings of fruits and vegetables a day.  Certain diets may be prescribed to address high blood pressure, high cholesterol, diabetes, or obesity.  Managing your cholesterol levels.  A low-saturated fat, low-trans fat, low-cholesterol, and high-fiber diet may control cholesterol levels.  Take any prescribed medicines to control cholesterol as directed by your caregiver.  Managing your diabetes.  A controlled-carbohydrate, controlled-sugar diet is recommended to manage diabetes.  Take any prescribed medicines to control diabetes as directed by your caregiver.  Controlling your high blood pressure (hypertension).  A low-salt (sodium), low-saturated fat, low-trans fat, and low-cholesterol diet is recommended to manage high blood pressure.  Take any prescribed medicines to control hypertension as directed by your caregiver.  Maintaining a healthy weight.  A reduced-calorie, low-sodium, low-saturated fat, low-trans fat, low-cholesterol diet is recommended to manage weight.  Stopping drug abuse.  Avoiding birth control pills.  Talk to your caregiver about the risks of taking birth control pills if you are over 71 years old, smoke, get migraines, or have ever had a blood clot.  Getting evaluated for sleep disorders (sleep apnea).  Talk to your caregiver about getting a sleep evaluation  if you snore a lot or have excessive sleepiness.  Taking medicines as directed by your caregiver.  For some people, aspirin or blood thinners (anticoagulants) are helpful in reducing the risk of forming abnormal blood clots that can lead to stroke. If you have the irregular heart rhythm of atrial fibrillation, you should be on a blood thinner unless there is a good reason you cannot take them.  Understand all your medicine instructions. SEEK IMMEDIATE MEDICAL CARE IF:   You have sudden weakness or numbness of the face, arm, or leg, especially on one side of the body.  You have sudden confusion.  You have trouble speaking (aphasia) or understanding.  You have sudden trouble seeing in one or both eyes.  You have sudden trouble walking.  You have dizziness.  You have a loss of balance or coordination.  You have a sudden, severe headache with no known cause.  You have new chest pain or an irregular heartbeat. Any of these symptoms may represent a serious problem that is an emergency. Do not wait to see if the symptoms will go away. Get medical help right away. Call your local emergency services (911 in [place] Do not drive yourself to the hospital. Document Released: 04/15/2004 Document Revised: 05/31/2011 Document Reviewed: 10/26/2010 [facility] Patient Information 2014 [place], [place].   Peripheral Vascular Disease Peripheral Vascular Disease (PVD), also called Peripheral Arterial Disease (PAD), is a circulation problem caused by cholesterol (atherosclerotic plaque) deposits in the arteries. PVD commonly occurs in the lower extremities (legs) but it can occur in other areas of the body, such as your arms. The cholesterol buildup in the arteries reduces blood flow which can cause pain and other serious problems. The presence  of PVD can place a person at risk for Coronary Artery Disease (CAD).  CAUSES  Causes of PVD can be many. It is usually associated with more than one risk factor such  as:   High Cholesterol.  Smoking.  Diabetes.  Lack of exercise or inactivity.  High blood pressure (hypertension).  Obesity.  Family history. SYMPTOMS   When the lower extremities are affected, patients with PVD may experience:  Leg pain with exertion or physical activity. This is called INTERMITTENT CLAUDICATION. This may present as cramping or numbness with physical activity. The location of the pain is associated with the level of blockage. For example, blockage at the abdominal level (distal abdominal aorta) may result in buttock or hip pain. Lower leg arterial blockage may result in calf pain.  As PVD becomes more severe, pain can develop with less physical activity.  In people with severe PVD, leg pain may occur at rest.  Other PVD signs and symptoms:  Leg numbness or weakness.  Coldness in the affected leg or foot, especially when compared to the other leg.  A change in leg color.  Patients with significant PVD are more prone to ulcers or sores on toes, feet or legs. These may take longer to heal or may reoccur. The ulcers or sores can become infected.  If signs and symptoms of PVD are ignored, gangrene may occur. This can result in the loss of toes or loss of an entire limb.  Not all leg pain is related to PVD. Other medical conditions can cause leg pain such as:  Blood clots (embolism) or Deep Vein Thrombosis.  Inflammation of the blood vessels (vasculitis).  Spinal stenosis. DIAGNOSIS  Diagnosis of PVD can involve several different types of tests. These can include:  Pulse Volume Recording Method (PVR). This test is simple, painless and does not involve the use of X-rays. PVR involves measuring and comparing the blood pressure in the arms and legs. An ABI (Ankle-Brachial Index) is calculated. The normal ratio of blood pressures is 1. As this number becomes smaller, it indicates more severe disease.  < 0.95  indicates significant narrowing in one or more leg  vessels.  <0.8 there will usually be pain in the foot, leg or buttock with exercise.  <0.4 will usually have pain in the legs at rest.  <0.25  usually indicates limb threatening PVD.  Doppler detection of pulses in the legs. This test is painless and checks to see if you have a pulses in your legs/feet.  A dye or contrast material (a substance that highlights the blood vessels so they show up on x-ray) may be given to help your caregiver better see the arteries for the following tests. The dye is eliminated from your body by the kidney's. Your caregiver may order blood work to check your kidney function and other laboratory values before the following tests are performed:  Magnetic Resonance Angiography (MRA). An MRA is a picture study of the blood vessels and arteries. The MRA machine uses a large magnet to produce images of the blood vessels.  Computed Tomography Angiography (CTA). A CTA is a specialized x-ray that looks at how the blood flows in your blood vessels. An IV may be inserted into your arm so contrast dye can be injected.  Angiogram. Is a procedure that uses x-rays to look at your blood vessels. This procedure is minimally invasive, meaning a small incision (cut) is made in your groin. A small tube (catheter) is then inserted into the artery  of your groin. The catheter is guided to the blood vessel or artery your caregiver wants to examine. Contrast dye is injected into the catheter. X-rays are then taken of the blood vessel or artery. After the images are obtained, the catheter is taken out. TREATMENT  Treatment of PVD involves many interventions which may include:  Lifestyle changes:  Quitting smoking.  Exercise.  Following a low fat, low cholesterol diet.  Control of diabetes.  Foot care is very important to the PVD patient. Good foot care can help prevent infection.  Medication:  Cholesterol-lowering medicine.  Blood pressure medicine.  Anti-platelet  drugs.  Certain medicines may reduce symptoms of Intermittent Claudication.  Interventional/Surgical options:  Angioplasty. An Angioplasty is a procedure that inflates a balloon in the blocked artery. This opens the blocked artery to improve blood flow.  Stent Implant. A wire mesh tube (stent) is placed in the artery. The stent expands and stays in place, allowing the artery to remain open.  Peripheral Bypass Surgery. This is a surgical procedure that reroutes the blood around a blocked artery to help improve blood flow. This type of procedure may be performed if Angioplasty or stent implants are not an option. SEEK IMMEDIATE MEDICAL CARE IF:   You develop pain or numbness in your arms or legs.  Your arm or leg turns cold, becomes blue in color.  You develop redness, warmth, swelling and pain in your arms or legs. MAKE SURE YOU:   Understand these instructions.  Will watch your condition.  Will get help right away if you are not doing well or get worse. Document Released: 04/15/2004 Document Revised: 05/31/2011 Document Reviewed: 03/12/2008 [employer] Patient Information 2014 [employer].   Smoking Cessation Quitting smoking is important to your health and has many advantages. However, it is not always easy to quit since nicotine is a very addictive drug. Often times, people try 3 times or more before being able to quit. This document explains the best ways for you to prepare to quit smoking. Quitting takes hard work and a lot of effort, but you can do it. ADVANTAGES OF QUITTING SMOKING  You will live longer, feel better, and live better.  Your body will feel the impact of quitting smoking almost immediately.  Within 20 minutes, blood pressure decreases. Your pulse returns to its normal level.  After 8 hours, carbon monoxide levels in the blood return to normal. Your oxygen level increases.  After 24 hours, the chance of having a heart attack starts to decrease. Your  breath, hair, and body stop smelling like smoke.  After 48 hours, damaged nerve endings begin to recover. Your sense of taste and smell improve.  After 72 hours, the body is virtually free of nicotine. Your bronchial tubes relax and breathing becomes easier.  After 2 to 12 weeks, lungs can hold more air. Exercise becomes easier and circulation improves.  The risk of having a heart attack, stroke, cancer, or lung disease is greatly reduced.  After 1 year, the risk of coronary heart disease is cut in half.  After 5 years, the risk of stroke falls to the same as a nonsmoker.  After 10 years, the risk of lung cancer is cut in half and the risk of other cancers decreases significantly.  After 15 years, the risk of coronary heart disease drops, usually to the level of a nonsmoker.  If you are pregnant, quitting smoking will improve your chances of having a healthy baby.  The people you live with,   especially any children, will be healthier.  You will have extra money to spend on things other than cigarettes. QUESTIONS TO THINK ABOUT BEFORE ATTEMPTING TO QUIT You may want to talk about your answers with your caregiver.  Why do you want to quit?  If you tried to quit in the past, what helped and what did not?  What will be the most difficult situations for you after you quit? How will you plan to handle them?  Who can help you through the tough times? Your family? Friends? A caregiver?  What pleasures do you get from smoking? What ways can you still get pleasure if you quit? Here are some questions to ask your caregiver:  How can you help me to be successful at quitting?  What medicine do you think would be best for me and how should I take it?  What should I do if I need more help?  What is smoking withdrawal like? How can I get information on withdrawal? GET READY  Set a quit date.  Change your environment by getting rid of all cigarettes, ashtrays, matches, and lighters in  your home, car, or work. Do not let people smoke in your home.  Review your past attempts to quit. Think about what worked and what did not. GET SUPPORT AND ENCOURAGEMENT You have a better chance of being successful if you have help. You can get support in many ways.  Tell your family, friends, and co-workers that you are going to quit and need their support. Ask them not to smoke around you.  Get individual, group, or telephone counseling and support. Programs are available at local hospitals and health centers. Call your local health department for information about programs in your area.  Spiritual beliefs and practices may help some smokers quit.  Download a "quit meter" on your computer to keep track of quit statistics, such as how long you have gone without smoking, cigarettes not smoked, and money saved.  Get a self-help book about quitting smoking and staying off of tobacco. LEARN NEW SKILLS AND BEHAVIORS  Distract yourself from urges to smoke. Talk to someone, go for a walk, or occupy your time with a task.  Change your normal routine. Take a different route to work. Drink tea instead of coffee. Eat breakfast in a different place.  Reduce your stress. Take a hot bath, exercise, or read a book.  Plan something enjoyable to do every day. Reward yourself for not smoking.  Explore interactive web-based programs that specialize in helping you quit. GET MEDICINE AND USE IT CORRECTLY Medicines can help you stop smoking and decrease the urge to smoke. Combining medicine with the above behavioral methods and support can greatly increase your chances of successfully quitting smoking.  Nicotine replacement therapy helps deliver nicotine to your body without the negative effects and risks of smoking. Nicotine replacement therapy includes nicotine gum, lozenges, inhalers, nasal sprays, and skin patches. Some may be available over-the-counter and others require a  prescription.  Antidepressant medicine helps people abstain from smoking, but how this works is unknown. This medicine is available by prescription.  Nicotinic receptor partial agonist medicine simulates the effect of nicotine in your brain. This medicine is available by prescription. Ask your caregiver for advice about which medicines to use and how to use them based on your health history. Your caregiver will tell you what side effects to look out for if you choose to be on a medicine or therapy. Carefully read the information   on the package. Do not use any other product containing nicotine while using a nicotine replacement product.  RELAPSE OR DIFFICULT SITUATIONS Most relapses occur within the first 3 months after quitting. Do not be discouraged if you start smoking again. Remember, most people try several times before finally quitting. You may have symptoms of withdrawal because your body is used to nicotine. You may crave cigarettes, be irritable, feel very hungry, cough often, get headaches, or have difficulty concentrating. The withdrawal symptoms are only temporary. They are strongest when you first quit, but they will go away within 10 14 days. To reduce the chances of relapse, try to:  Avoid drinking alcohol. Drinking lowers your chances of successfully quitting.  Reduce the amount of caffeine you consume. Once you quit smoking, the amount of caffeine in your body increases and can give you symptoms, such as a rapid heartbeat, sweating, and anxiety.  Avoid smokers because they can make you want to smoke.  Do not let weight gain distract you. Many smokers will gain weight when they quit, usually less than 10 pounds. Eat a healthy diet and stay active. You can always lose the weight gained after you quit.  Find ways to improve your mood other than smoking. FOR MORE INFORMATION  www.smokefree.gov  Document Released: 03/02/2001 Document Revised: 09/07/2011 Document Reviewed:  06/17/2011 [facility] Patient Information 2014 [place], [place].

## 2012-12-19 NOTE — Progress Notes (Signed)
Established Carotid Patient  Previous Carotid surgery: Yes Surgeon:Early  History of Present Illness  [name] is a 72 y.o. male patient of [name] who presents for scheduled follow up of bilateral carotid endarterectomies. Initially this was the right carotid in 2002 and he underwent left carotid endarterectomy in September of 2013. His PMHx is significant for 8 cardiac stents. Unfortunately he has resumed smoking, states he is under lots of stress, a close friend is dying of cancer. Dr. Clifton James is his cardiologist.  Patient has Positive history of TIA  Symptom perioperatively and states the numbness in his right 5th finger is about the same, no other neurological deficits.   The patient denies amaurosis fugax or monocular blindness.  The patient  denies facial drooping.  Pt. denies hemiplegia.  The patient denies receptive or expressive aphasia.  Pt. denies extremity weakness. Patient denies chest pain put reports dyspnea with exertion,; he denies tightness in muscles of legs with walking but muscles of legs feels tired after walking on a steep incline for about 10 minutes. The patient's previous neurologic deficits are Unchanged.   Patient denies New Medical or Surgical History.  Pt Diabetic: No Pt smoker: smoker  (1 ppd x 54 yrs)  Pt meds include: Statin : Yes Betablocker: Yes ASA: Yes Other anticoagulants/antiplatelets: Plavix   Past Medical History  Diagnosis Date  . COPD (chronic obstructive pulmonary disease)   . CAD (coronary artery disease)     Ant MI 74  . HTN (hypertension)   . Hyperlipidemia   . Osteoarthritis   . Peripheral vascular disease   . Prostate cancer   . Carotid artery disease     Right CEA 2002 per [name]  . Myocardial infarction   . GERD (gastroesophageal reflux disease)     occ  . CHF (congestive heart failure)     pt denies 09/25/12  . Anxiety   . PAC (premature atrial contraction)   . Pneumonia   . Stroke     Social  History History  Substance Use Topics  . Smoking status: Current Every Day Smoker -- 0.50 packs/day for 55 years    Types: Cigarettes  . Smokeless tobacco: Never Used  . Alcohol Use: Yes     Comment: occ    Family History Family History  Problem Relation Age of Onset  . Stroke Father   . Heart disease Mother     pacemaker  . Other Brother     benign brain tumor  . Liver cancer Brother   . Diabetes Maternal Grandmother     Surgical History Past Surgical History  Procedure Laterality Date  . Coronary stent placement  97, 2000, 2012    8 stents  . Doppler echocardiography  1995    neg  . Carotid endarterectomy  03-2000    right  . Colonoscopy  04-2001    polyp  . Cardiovascular stress test  12-2006  . Cardiac catheterization    . Vascular surgery    . Endarterectomy  11/24/2011    Procedure: ENDARTERECTOMY CAROTID;  Surgeon: Ian Earthly, MD;  Location: [facility] OR;  Service: Vascular;  Laterality: Left;    Allergies  Allergen Reactions  . Augmentin [Amoxicillin-Pot Clavulanate] Nausea And Vomiting    Diarrhea  . Sulfonamide Derivatives   . Zantac [Ranitidine Hcl]     diarrhea    Current Outpatient Prescriptions  Medication Sig Dispense Refill  . ADVAIR DISKUS 500-50 MCG/DOSE AEPB INHALE 1 PUFF INTO THE LUNGS EVERY 12 (  TWELVE) HOURS.  60 each  5  . ALPRAZolam (XANAX) 0.5 MG tablet Take 0.5 mg by mouth at bedtime as needed. Sleep or anxiety      . aspirin 81 MG tablet Take 81 mg by mouth daily.       . clopidogrel (PLAVIX) 75 MG tablet Take 1 tablet (75 mg total) by mouth daily.  30 tablet  2  . loratadine (CLARITIN) 10 MG tablet Take 10 mg by mouth daily.      . metoprolol tartrate (LOPRESSOR) 25 MG tablet TAKE 1/2 TABLET TWICE A DAY  30 tablet  3  . nitroGLYCERIN (NITROSTAT) 0.4 MG SL tablet Place 0.4 mg under the tongue every 5 (five) minutes as needed. For chest pain      . pantoprazole (PROTONIX) 40 MG tablet Take 40 mg by mouth daily as needed.      . pravastatin  (PRAVACHOL) 40 MG tablet Take one tablet by mouth every other day  15 tablet  6  . SPIRIVA HANDIHALER 18 MCG inhalation capsule INHALE 1 CAPSULE VIA HANDIHALER ONCE DAILY AT THE SAME TIME EVERY DAY  30 capsule  6  . Tamsulosin HCl (FLOMAX) 0.4 MG CAPS Take 0.4 mg by mouth daily.       . traMADol (ULTRAM) 50 MG tablet Take 1 tablet (50 mg total) by mouth every 6 (six) hours as needed for pain.  50 tablet  2  . VENTOLIN HFA 108 (90 BASE) MCG/ACT inhaler INHALE 2 PUFFS INTO THE LUNGS EVERY 4 (FOUR) HOURS AS NEEDED FOR WHEEZING OR SHORTNESS OF BREATH.  18 each  6   No current facility-administered medications for this visit.    Review of Systems : [x]  Positive   [ ]  Denies  General:[ ]  Weight loss,  [ ]  Weight gain, [ ]  Loss of appetite, [ ]  Fever, [ ]  chills  Neurologic: [ ]  Dizziness, [ ]  Blackouts, [ ]  Headaches, [ ]  Seizure [ ]  Stroke, [ ]  "Mini stroke", [ ]  Slurred speech, [ ]  Temporary blindness;  [ ] weakness,  Ear/Nose/Throat: [ ]  Change in hearing, [ ]  Nose bleeds, [ ]  Hoarseness  Vascular:[ ]  Pain in legs with walking, [ ]  Pain in feet while lying flat , [ ]   Non-healing ulcer, [ ]  Blood clot in vein,    Pulmonary: [ ]  Home oxygen, [ ]   Productive cough, [ ]  Bronchitis, [ ]  Coughing up blood,  [ ]  Asthma, [ ]  Wheezing  Musculoskeletal:  Arly.Keller ] Arthritis, [ ]  Joint pain, [ ]  low back pain  Cardiac: [ ]  Chest pain, [ ]  Shortness of breath when lying flat, [ ]  Shortness of breath with exertion, [ ]  Palpitations, [ ]  Heart murmur, [ ]   Atrial fibrillation  Hematologic:[ ]  Easy Bruising, [ ]  Anemia; [ ]  Hepatitis  Psychiatric: [ ]   Depression, [ ]  Anxiety   Gastrointestinal: [ ]  Black stool, [ ]  Blood in stool, [ ]  Peptic ulcer disease,  [ ]  Gastroesophageal Reflux, [ ]  Trouble swallowing, [ ]  Diarrhea, [ ]  Constipation  Urinary: [ ]  chronic Kidney disease, [ ]  on HD, [ ]  Burning with urination, [ ]  Frequent urination, [ ]  Difficulty urinating;   Skin: [ ]  Rashes, [ ]  Wounds     Physical Examination  Filed Vitals:   12/19/12 0937  BP: 133/71  Pulse: 77  Resp:    Filed Weights   12/19/12 0934  Weight: 190 lb (86.183 kg)   Body mass index is 27.26 kg/(m^2).  General: WDWN male in NAD GAIT: normal Eyes: PERRLA Pulmonary:  CTAB, Negative  Rales, Negative rhonchi, & Positive expiratory wheezing in posterior fields. Positive for chronic loose cough.  Cardiac: regular Rhythm ,  Negative Murmurs, no peripheral edema.Marland Kitchen  VASCULAR EXAM Carotid Bruits Left Right   Negative Negative      Aorta is not palpable. Bilateral radial pulses are palpable at 2+.                                                                                                                          LE Pulses LEFT RIGHT       FEMORAL   palpable   palpable        POPLITEAL  not palpable   not palpable       POSTERIOR TIBIAL   palpable    palpable        DORSALIS PEDIS      ANTERIOR TIBIAL not palpable  not palpable       Gastrointestinal: soft, nontender, BS WNL, no r/g,  negative masses.  Musculoskeletal: Negative muscle atrophy/wasting. M/S 5/5 throughout, extremities without ischemic changes.  Neurologic: A&O X 3; Appropriate Affect ; SENSATION ;normal;  Speech is normal CN 2-12 intact, Pain and light touch intact in extremities, Motor exam as listed above.   Non-Invasive Vascular Imaging CAROTID DUPLEX 12/19/2012   Patent bilateral carotid  endarterectomy sites with minimal hyperplasia present.  These findings are Unchanged from previous exam   Assessment: THERMON ZULAUF is a 72 y.o. male who presents with asymptomatic patent bilateral carotid  endarterectomy sites with minimal hyperplasia present The  ICA stenosis is  Unchanged from previous exam. He has mild claudication symptoms in his legs after walking a steep incline for 10 minutes. His major risk factor for cardiovascular disease remains tobacco use, smokes 1 PPD; he was counseled re smoking cessation  and given printed information re this. His brachial systolic pressures are equivalent.  Plan: Follow-up in 1 year with Carotid Duplex scan and ABI's.   I discussed in depth with the patient the nature of atherosclerosis, and emphasized the importance of maximal medical management including strict control of blood pressure, blood glucose, and lipid levels, obtaining regular exercise, and cessation of smoking.  The patient is aware that without maximal medical management the underlying atherosclerotic disease process will progress, limiting the benefit of any interventions. The patient was given information about stroke prevention and what symptoms should prompt the patient to seek immediate medical care. Thank you for allowing [place] to participate in this patient's care.  Charisse March, RN, MSN, FNP-C [facility] Office: [phone]  Clinic Physician: Early  12/19/2012 8:59 AM   VASCULAR QUALITY INITIATIVE FOLLOW UP DATA:  Current smoker: Arly.Keller  ] yes  [  ] no  Living status: Arly.Keller  ]  Home  [  ] Nursing home  [  ] Homeless    MEDS:  ASA [ X ] yes  [  ]  no- [  ] medical reason  [  ] non compliant  STATIN  Arly.Keller  ] yes  [  ] no- [  ] medical reason  [  ] non compliant  Beta blocker [ X ] yes  [  ] no- [  ] medical reason  [  ] non compliant  ACE inhibitor [  ] yes  [ X ] no- [  ] medical reason  [  ] non compliant  P2Y12 Antagonist [  ] none  [ X ] clopidogrel-Plavix  [  ] ticlopidine-Ticlid   [  ] prasugrel-Effient  [  ] ticagrelor- Brilinta    Anticoagulant Arly.Keller  ] None  [  ] warfarin  [  ] rivaroxaban-Xarelto [  ] dabigatran- Pradaxa  Neurologic event since D/C:  Arly.Keller  ] no  [  ] yes: [  ] eye event  [  ] cortical event  [  ] VB event  [  ] non specific event  [  ] right  [  ] left  [  ] TIA  [  ] stroke  Date:   Modified Rankin Score: 1

## 2012-12-20 NOTE — Addendum Note (Signed)
Addended by: Sharee Pimple on: 12/20/2012 08:23 AM   Modules accepted: Orders

## 2012-12-25 ENCOUNTER — Other Ambulatory Visit: Payer: Self-pay | Admitting: Emergency Medicine

## 2013-01-29 ENCOUNTER — Other Ambulatory Visit: Payer: Self-pay | Admitting: Family Medicine

## 2013-01-29 NOTE — Telephone Encounter (Signed)
Ok to refill #50, 2 refills 

## 2013-01-29 NOTE — Telephone Encounter (Signed)
Last office visit 11/29/2012 with Dr Patsy Lager,  PCP: Dr. Milinda Antis.  Ok to refill?

## 2013-01-29 NOTE — Telephone Encounter (Signed)
Called to [facility]. [place].

## 2013-02-06 ENCOUNTER — Other Ambulatory Visit: Payer: Self-pay | Admitting: Cardiovascular Disease

## 2013-03-06 ENCOUNTER — Other Ambulatory Visit (INDEPENDENT_AMBULATORY_CARE_PROVIDER_SITE_OTHER): Payer: Medicare Other

## 2013-03-06 DIAGNOSIS — E7800 Pure hypercholesterolemia, unspecified: Secondary | ICD-10-CM

## 2013-03-06 LAB — HEPATIC FUNCTION PANEL
ALT: 11 U/L (ref 0–53)
Alkaline Phosphatase: 38 U/L — ABNORMAL LOW (ref 39–117)
Bilirubin, Direct: 0 mg/dL (ref 0.0–0.3)
Total Protein: 7.1 g/dL (ref 6.0–8.3)

## 2013-03-06 LAB — LIPID PANEL
Cholesterol: 157 mg/dL (ref 0–200)
Total CHOL/HDL Ratio: 3

## 2013-03-09 ENCOUNTER — Encounter: Payer: Self-pay | Admitting: Emergency Medicine

## 2013-03-09 ENCOUNTER — Telehealth: Payer: Self-pay | Admitting: Cardiovascular Disease

## 2013-03-09 ENCOUNTER — Ambulatory Visit (INDEPENDENT_AMBULATORY_CARE_PROVIDER_SITE_OTHER): Payer: Medicare Other | Admitting: Emergency Medicine

## 2013-03-09 VITALS — BP 140/82 | HR 80 | Ht 70.0 in | Wt 194.4 lb

## 2013-03-09 DIAGNOSIS — F17200 Nicotine dependence, unspecified, uncomplicated: Secondary | ICD-10-CM

## 2013-03-09 DIAGNOSIS — J441 Chronic obstructive pulmonary disease with (acute) exacerbation: Secondary | ICD-10-CM

## 2013-03-09 MED ORDER — PREDNISONE 10 MG PO TABS: ORAL_TABLET | ORAL | Status: DC

## 2013-03-09 MED ORDER — DOXYCYCLINE HYCLATE 100 MG PO TABS: 100.0000 mg | ORAL_TABLET | Freq: Two times a day (BID) | ORAL | Status: DC

## 2013-03-09 NOTE — Assessment & Plan Note (Signed)
Continues to smoke - discussed cessation with him today.

## 2013-03-09 NOTE — Patient Instructions (Signed)
Please take prednisone and doxycycline as directed Continue your Spiriva and Advair Use albuterol as needed Call our office if you are not improving by next week Follow with Dr [name] in 1 month or sooner if you have any problems.

## 2013-03-09 NOTE — Progress Notes (Signed)
Subjective:  Patient ID: [name], male    DOB: 1940/10/20, 72 y.o.   MRN: [id number] HPI 72 yo smoker (~100pk-yrs), hx of CAD/PTCI, HTN,  prostate CA. Dx with COPD around 10 yrs ago. Last seen 2007 by PW. Maintanance was Advair bid. Previously on Spiriva, stopped in 2008.   ROV 12/29/10 -- Severe COPD, CAD (PTCI by Dr [name]). Walking oximetry last visit showed desats but he didn't want to start O2. Tells me that he was seen by Dr [name] last week, exertional SOB, CP, weakness. He underwent L cath with PTCI. He reports continued DOE, but CP has resolved, weakness is better. Last time we did experiment changing Advair to Symbicort - he believes that it is at least as good, not necessarily better. Still on Spiriva. He has not restarted quinipril. We treated him prednisone after his last visit here, he says it helped his stamina, may have made breathing better. Smoking 5-10 cig a day.   ROV 03/08/11 -- Severe COPD, documented hypoxemia but has not wanted O2. Had been well until developed URI about 2 weeks ago. Has had progressive SOB, wheeze, cough. Prod of green/brown, sometimes clear. He stopped smoking about 1 weeks ago.  Remains on Spiriva, went back to Advair.   ROV 04/08/11 -- Severe COPD, documented hypoxemia but has not wanted O2. Reports that he has restarted smoking - about 1/3 of a pack daily. Did not tolerate Chantix. He is improved after rx with doxy and pred 1 months ago. Some cough, less freq - clear sputum. Remains on Spiriva and Advair 500/50.   ROV 07/14/11 -- Severe COPD, documented hypoxemia but has not wanted O2. Remains on Spiriva and Advair 500/50. DuoNebs prn, hasn't needed since last visit. Has been using albuterol HFA about once a day. Still smoking. Unfortunately his wife died 29-Apr-2022, quitting tobacco hasn't been possible yet. He wants to quit again, but not ready to set quit date. He is still working.   ROV 10/26/11 -- Hx CAD, Severe COPD, documented hypoxemia but has not  wanted O2. Remains on Spiriva and Advair 500/50. DuoNebs or albuterol prn. Still smoking 3/4 pk a day. Remains on Spiriva + Advair. No AE since last visit. Able to work part-time, has difficulty w yard work.  Planning for L carotid sgy with Dr [name]. Some increased cough last 3 -4 days, more mucous. No change in dyspnea or wheeze.   ROV 03/17/12 -- Hx CAD, Severe COPD, documented hypoxemia but has not wanted O2. Remains on Spiriva and Advair 500/50. DuoNebs or albuterol prn >> uses it 1 -2 x a day. 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 Has had some dry cough last few days.  He is starting the e-cig, hasn't had a cigarette in 2-3 days.   ROV 07/17/12 -- Hx CAD, Severe COPD, documented hypoxemia (not on O2). He has not smoked since last time - has instead been using e-cig. Discussed the pro's/con's of this. No flares, no bronchitis. He has some exertional wheeze. His cough has increased with the allergy season. He is on loratadine.   ROV 11/17/12 -- Hx CAD, Severe COPD, documented hypoxemia (not on O2). He restarted smoking about 2 months ago. He notes that the e cig irritated him and made cough. He is smoking 1/2 pk a day. Cough has been a bit more x 2 weeks. Productive of clear mucous, worst in the am. Still on Advair + Spiriva.  Uses albuterol 2-3x a day. Remains on  loratadine. He had tried chantix before > had side effects. Breathing is stable, some exertional SOB.   03/09/13 -- Hx CAD, Severe COPD, documented hypoxemia (not on O2).  He has had an increased cough for about 2-3 weeks.  He is on Spiriva and Advair, needed albuterol a few times which is unusual.    CAT Score 03/17/2012  Total CAT Score 10    Objective:  Physical Exam Filed Vitals:   03/09/13 1020  BP: 140/82  Pulse: 80   Gen: Pleasant, obese man, in no distress,  normal affect  ENT: No lesions,  mouth clear,  oropharynx clear, no postnasal drip  Neck: No JVD, no TMG, no carotid bruits  Lungs: No  use of accessory muscles, no dullness to percussion, Coarse BS w/ few rhonchi, few scattered wheezes  Cardiovascular: RRR, heart sounds normal, no murmur or gallops, no peripheral edema  Musculoskeletal: No deformities, no cyanosis or clubbing  Neuro: alert, non focal  Skin: Warm, no lesions or rashes   Assessment & Plan:  CHRONIC OBSTRUCTIVE PULMONARY DISEASE, ACUTE EXACERBATION Acute exacerbation following URI - pred taper, doxy - spiriva + advair - albuterol prn/  - rov 2  Nicotine dependence Continues to smoke - discussed cessation with him today.

## 2013-03-09 NOTE — Telephone Encounter (Signed)
New  Message ° °Pt returned call  °

## 2013-03-09 NOTE — Telephone Encounter (Signed)
Spoke with pt and reviewed lipid and liver profile results with him.  

## 2013-03-09 NOTE — Assessment & Plan Note (Signed)
Acute exacerbation following URI - pred taper, doxy - spiriva + advair - albuterol prn/  - rov 2

## 2013-04-03 ENCOUNTER — Other Ambulatory Visit: Payer: Self-pay | Admitting: Emergency Medicine

## 2013-04-06 ENCOUNTER — Ambulatory Visit (INDEPENDENT_AMBULATORY_CARE_PROVIDER_SITE_OTHER): Payer: Medicare Other | Admitting: Emergency Medicine

## 2013-04-06 ENCOUNTER — Encounter: Payer: Self-pay | Admitting: Emergency Medicine

## 2013-04-06 VITALS — BP 140/70 | HR 98 | Ht 70.0 in | Wt 198.8 lb

## 2013-04-06 DIAGNOSIS — J449 Chronic obstructive pulmonary disease, unspecified: Secondary | ICD-10-CM

## 2013-04-06 NOTE — Patient Instructions (Signed)
Please continue your current inhaled medications  We talked a lot today about stopping smoking. We set a goal for you to be down to 7 cigarettes a day by our next visit The medication we discussed to help stop smoking was bupropion (Wellbutrin/Zyban). Follow with Dr Lamonte Sakai in 2 months.

## 2013-04-06 NOTE — Progress Notes (Signed)
Subjective:  Patient ID: [name], male    DOB: 05/19/1940, 73 y.o.   MRN: [id number] HPI 73 yo smoker (~100pk-yrs), hx of CAD/PTCI, HTN,  prostate CA. Dx with COPD around 10 yrs ago. Last seen 2007 by PW. Maintanance was Advair bid. Previously on Spiriva, stopped in 2008.   ROV 12/29/10 -- Severe COPD, CAD (PTCI by Dr [name]). Walking oximetry last visit showed desats but he didn't want to start O2. Tells me that he was seen by Dr [name] last week, exertional SOB, CP, weakness. He underwent L cath with PTCI. He reports continued DOE, but CP has resolved, weakness is better. Last time we did experiment changing Advair to Symbicort - he believes that it is at least as good, not necessarily better. Still on Spiriva. He has not restarted quinipril. We treated him prednisone after his last visit here, he says it helped his stamina, may have made breathing better. Smoking 5-10 cig a day.   ROV 03/08/11 -- Severe COPD, documented hypoxemia but has not wanted O2. Had been well until developed URI about 2 weeks ago. Has had progressive SOB, wheeze, cough. Prod of green/brown, sometimes clear. He stopped smoking about 1 weeks ago.  Remains on Spiriva, went back to Advair.   ROV 04/08/11 -- Severe COPD, documented hypoxemia but has not wanted O2. Reports that he has restarted smoking - about 1/3 of a pack daily. Did not tolerate Chantix. He is improved after rx with doxy and pred 1 months ago. Some cough, less freq - clear sputum. Remains on Spiriva and Advair 500/50.   ROV 07/14/11 -- Severe COPD, documented hypoxemia but has not wanted O2. Remains on Spiriva and Advair 500/50. DuoNebs prn, hasn't needed since last visit. Has been using albuterol HFA about once a day. Still smoking. Unfortunately his wife died 2022/05/10, quitting tobacco hasn't been possible yet. He wants to quit again, but not ready to set quit date. He is still working.   ROV 10/26/11 -- Hx CAD, Severe COPD, documented hypoxemia but has not  wanted O2. Remains on Spiriva and Advair 500/50. DuoNebs or albuterol prn. Still smoking 3/4 pk a day. Remains on Spiriva + Advair. No AE since last visit. Able to work part-time, has difficulty w yard work.  Planning for L carotid sgy with Dr [name]. Some increased cough last 3 -4 days, more mucous. No change in dyspnea or wheeze.   ROV 03/17/12 -- Hx CAD, Severe COPD, documented hypoxemia but has not wanted O2. Remains on Spiriva and Advair 500/50. DuoNebs or albuterol prn >> uses it 1 -2 x a day. 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 Has had some dry cough last few days.  He is starting the e-cig, hasn't had a cigarette in 2-3 days.   ROV 07/17/12 -- Hx CAD, Severe COPD, documented hypoxemia (not on O2). He has not smoked since last time - has instead been using e-cig. Discussed the pro's/con's of this. No flares, no bronchitis. He has some exertional wheeze. His cough has increased with the allergy season. He is on loratadine.   ROV 11/17/12 -- Hx CAD, Severe COPD, documented hypoxemia (not on O2). He restarted smoking about 2 months ago. He notes that the e cig irritated him and made cough. He is smoking 1/2 pk a day. Cough has been a bit more x 2 weeks. Productive of clear mucous, worst in the am. Still on Advair + Spiriva.  Uses albuterol 2-3x a day. Remains on  loratadine. He had tried chantix before > had side effects. Breathing is stable, some exertional SOB.   03/09/13 -- Hx CAD, Severe COPD, documented hypoxemia (not on O2).  He has had an increased cough for about 2-3 weeks.  He is on Spiriva and Advair, needed albuterol a few times which is unusual.   ROV 04/06/13 -- severe COPD, seen for an AE in mid December. He got better on pred, then a little more cough. Still smoking. Returns today describing cough and mucous, not as bad as the exacerbation.     CAT Score 03/17/2012  Total CAT Score 10    Objective:  Physical Exam Filed Vitals:   04/06/13 1032  BP:  140/70  Pulse: 98   Gen: Pleasant, obese man, in no distress,  normal affect  ENT: No lesions,  mouth clear,  oropharynx clear, no postnasal drip  Neck: No JVD, no TMG, no carotid bruits  Lungs: No use of accessory muscles, no dullness to percussion, Coarse BS w/ few rhonchi, few scattered wheezes  Cardiovascular: RRR, heart sounds normal, no murmur or gallops, no peripheral edema  Musculoskeletal: No deformities, no cyanosis or clubbing  Neuro: alert, non focal  Skin: Warm, no lesions or rashes   Assessment & Plan:  COPD - continue current inhaled meds.  - discussed cessation > decrease cigarettes to 7 a day by next visit - will consider wellbutrin in the future when ready to set quit  - rov 2

## 2013-04-06 NOTE — Assessment & Plan Note (Signed)
-   continue current inhaled meds.  - discussed cessation > decrease cigarettes to 7 a day by next visit - will consider wellbutrin in the future when ready to set quit  - rov 2

## 2013-04-13 ENCOUNTER — Ambulatory Visit: Payer: Medicare Other | Admitting: Emergency Medicine

## 2013-05-04 ENCOUNTER — Ambulatory Visit (INDEPENDENT_AMBULATORY_CARE_PROVIDER_SITE_OTHER): Payer: Medicare Other | Admitting: Family Medicine

## 2013-05-04 ENCOUNTER — Encounter: Payer: Self-pay | Admitting: Family Medicine

## 2013-05-04 VITALS — BP 132/78 | HR 84 | Temp 97.8°F | Ht 70.0 in | Wt 194.5 lb

## 2013-05-04 DIAGNOSIS — L0292 Furuncle, unspecified: Secondary | ICD-10-CM

## 2013-05-04 DIAGNOSIS — L0293 Carbuncle, unspecified: Secondary | ICD-10-CM

## 2013-05-04 MED ORDER — ALPRAZOLAM 0.5 MG PO TABS: 0.5000 mg | ORAL_TABLET | Freq: Every evening | ORAL | Status: DC | PRN

## 2013-05-04 MED ORDER — MUPIROCIN 2 % EX OINT: 1.0000 "application " | TOPICAL_OINTMENT | Freq: Two times a day (BID) | CUTANEOUS | Status: DC

## 2013-05-04 MED ORDER — CLINDAMYCIN HCL 300 MG PO CAPS: 300.0000 mg | ORAL_CAPSULE | Freq: Three times a day (TID) | ORAL | Status: DC

## 2013-05-04 NOTE — Progress Notes (Signed)
Subjective:    Patient ID: [name], male    DOB: 09-18-1940, 73 y.o.   MRN: [id number]  HPI Here for boils under arms  Has had these for years -on and off  Had several that ruptured and drained-got better and came back New ones- 3 weeks   L axilla - draining R side -not yet Painful   Has not used warm compresses   Patient Active Problem List   Diagnosis Date Noted  . Aftercare following surgery of the circulatory system, [place] 12/19/2012  . Diarrhea 08/28/2012  . Personal history of colonic polyps 08/28/2012  . Hyperglycemia 08/07/2012  . Boils 04/03/2012  . Intertrigo 04/03/2012  . Pre-operative cardiovascular examination 11/03/2011  . Carotid artery disease 05/20/2011  . Grief reaction 05/07/2011  . Ischemic cardiomyopathy 01/13/2011  . Palpitations 11/10/2010  . Occlusion and stenosis of carotid artery without mention of cerebral infarction 04/02/2010  . CHEST PAIN 04/02/2010  . ANXIETY 12/12/2009  . HYPERTENSION, BENIGN 09/29/2008  . PSA, INCREASED 09/19/2008  . CHRONIC OBSTRUCTIVE PULMONARY DISEASE, ACUTE EXACERBATION 06/19/2007  . HYPERLIPIDEMIA 02/10/2007  . AMAUROSIS FUGAX 02/10/2007  . MYOCARDIAL INFARCTION, HX OF 02/10/2007  . CORONARY ARTERY DISEASE 02/10/2007  . PERIPHERAL VASCULAR DISEASE 02/10/2007  . HEMORRHOIDS 02/10/2007  . EMPHYSEMA 02/10/2007  . COPD 02/10/2007  . OSTEOARTHRITIS 02/10/2007  . Nicotine dependence 02/10/2007  . PURE HYPERCHOLESTEROLEMIA 01/27/2007  . CORONARY ATHEROSCLEROSIS NATIVE CORONARY ARTERY 01/27/2007   Past Medical History  Diagnosis Date  . COPD (chronic obstructive pulmonary disease)   . CAD (coronary artery disease)     Ant MI 22  . HTN (hypertension)   . Hyperlipidemia   . Osteoarthritis   . Peripheral vascular disease   . Prostate cancer   . Carotid artery disease     Right CEA 2002 per Dr. [name]  . Myocardial infarction   . GERD (gastroesophageal reflux disease)     occ  . CHF (congestive heart  failure)     pt denies 09/25/12  . Anxiety   . PAC (premature atrial contraction)   . Pneumonia   . Stroke    Past Surgical History  Procedure Laterality Date  . Coronary stent placement  97, 2000, 2012    8 stents  . Doppler echocardiography  1995    neg  . Carotid endarterectomy  03-2000    right  . Colonoscopy  04-2001    polyp  . Cardiovascular stress test  12-2006  . Cardiac catheterization    . Vascular surgery    . Endarterectomy  11/24/2011    Procedure: ENDARTERECTOMY CAROTID;  Surgeon: Rosetta Posner, MD;  Location: [facility];  Service: Vascular;  Laterality: Left;   History  Substance Use Topics  . Smoking status: Current Every Day Smoker -- 0.50 packs/day for 55 years    Types: Cigarettes  . Smokeless tobacco: Never Used  . Alcohol Use: Yes     Comment: occ   Family History  Problem Relation Age of Onset  . Stroke Father   . Heart disease Mother     pacemaker  . Other Brother     benign brain tumor  . Liver cancer Brother   . Diabetes Maternal Grandmother    Allergies  Allergen Reactions  . Augmentin [Amoxicillin-Pot Clavulanate] Nausea And Vomiting    Diarrhea  . Sulfonamide Derivatives   . Zantac [Ranitidine Hcl]     diarrhea   Current Outpatient Prescriptions on File Prior to Visit  Medication Sig Dispense  Refill  . ADVAIR DISKUS 500-50 MCG/DOSE AEPB INHALE 1 PUFF INTO THE LUNGS EVERY 12 (TWELVE) HOURS.  60 each  5  . ALPRAZolam (XANAX) 0.5 MG tablet Take 0.5 mg by mouth at bedtime as needed. Sleep or anxiety      . aspirin 81 MG tablet Take 81 mg by mouth daily.       . clopidogrel (PLAVIX) 75 MG tablet Take 1 tablet (75 mg total) by mouth daily.  30 tablet  2  . loratadine (CLARITIN) 10 MG tablet Take 10 mg by mouth daily.      . metoprolol tartrate (LOPRESSOR) 25 MG tablet TAKE 1/2 TABLET TWICE A DAY  30 tablet  3  . nitroGLYCERIN (NITROSTAT) 0.4 MG SL tablet Place 0.4 mg under the tongue every 5 (five) minutes as needed. For chest pain      .  pantoprazole (PROTONIX) 40 MG tablet Take 40 mg by mouth daily as needed.      . pravastatin (PRAVACHOL) 40 MG tablet Take one tablet by mouth every other day  15 tablet  6  . SPIRIVA HANDIHALER 18 MCG inhalation capsule INHALE 1 CAPSULE VIA HANDIHALER ONCE DAILY AT THE SAME TIME EVERY DAY  30 capsule  6  . Tamsulosin HCl (FLOMAX) 0.4 MG CAPS Take 0.4 mg by mouth daily.       . traMADol (ULTRAM) 50 MG tablet TAKE 1 TABLET (50 MG TOTAL) BY MOUTH EVERY 6 (SIX) HOURS AS NEEDED FOR PAIN.  50 tablet  2  . VENTOLIN HFA 108 (90 BASE) MCG/ACT inhaler INHALE 2 PUFFS INTO THE LUNGS EVERY 4 (FOUR) HOURS AS NEEDED FOR WHEEZING OR SHORTNESS OF BREATH.  18 each  6   No current facility-administered medications on file prior to visit.    Review of Systems Review of Systems  Constitutional: Negative for fever, appetite change, fatigue and unexpected weight change.  Eyes: Negative for pain and visual disturbance.  Respiratory: Negative for cough and shortness of breath.   Cardiovascular: Negative for cp or palpitations    Gastrointestinal: Negative for nausea, diarrhea and constipation.  Genitourinary: Negative for urgency and frequency.  Skin: Negative for pallor or rash   Neurological: Negative for weakness, light-headedness, numbness and headaches.  Hematological: Negative for adenopathy. Does not bruise/bleed easily.  Psychiatric/Behavioral: Negative for dysphoric mood. The patient occnervous/anxious.  -needs refill of his xanax       Objective:   Physical Exam  Constitutional: He appears well-developed and well-nourished. No distress.  HENT:  Head: Normocephalic and atraumatic.  Mouth/Throat: Oropharynx is clear and moist.  Eyes: Conjunctivae and EOM are normal. Pupils are equal, round, and reactive to light. Right eye exhibits no discharge. Left eye exhibits no discharge.  Neck: Normal range of motion. Neck supple.  Cardiovascular: Normal rate and regular rhythm.   Musculoskeletal: He exhibits  no edema.  Lymphadenopathy:    He has no cervical adenopathy.  Skin: Skin is warm and dry. There is erythema.  L axilla- 1 cm boil that has drained and is scabbed over R axilla - 1.5 cm boil- soft / but unable to express pus  Areas cleaned - R one dressed with band aid     Psychiatric: He has a normal mood and affect.          Assessment & Plan:

## 2013-05-04 NOTE — Progress Notes (Signed)
Pre-visit discussion using our clinic review tool. No additional management support is needed unless otherwise documented below in the visit note.  

## 2013-05-04 NOTE — Patient Instructions (Signed)
Keep areas clean with antibacterial soap and water Take the clindamycin as directed  Warm compresses are helpful  bactroban ointment as needed Update if not starting to improve in a week or if worsening

## 2013-05-06 ENCOUNTER — Encounter: Payer: Self-pay | Admitting: Family Medicine

## 2013-05-06 NOTE — Assessment & Plan Note (Signed)
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

## 2013-05-07 ENCOUNTER — Telehealth: Payer: Self-pay | Admitting: Family Medicine

## 2013-05-07 NOTE — Telephone Encounter (Signed)
Relevant patient education assigned to patient using Emmi. ° °

## 2013-05-21 ENCOUNTER — Ambulatory Visit (INDEPENDENT_AMBULATORY_CARE_PROVIDER_SITE_OTHER): Payer: Medicare Other | Admitting: Cardiovascular Disease

## 2013-05-21 ENCOUNTER — Encounter: Payer: Self-pay | Admitting: Cardiovascular Disease

## 2013-05-21 VITALS — BP 118/64 | HR 87 | Ht 70.0 in | Wt 193.0 lb

## 2013-05-21 DIAGNOSIS — E785 Hyperlipidemia, unspecified: Secondary | ICD-10-CM

## 2013-05-21 DIAGNOSIS — F17200 Nicotine dependence, unspecified, uncomplicated: Secondary | ICD-10-CM

## 2013-05-21 DIAGNOSIS — I2510 Atherosclerotic heart disease of native coronary artery without angina pectoris: Secondary | ICD-10-CM

## 2013-05-21 DIAGNOSIS — I779 Disorder of arteries and arterioles, unspecified: Secondary | ICD-10-CM

## 2013-05-21 DIAGNOSIS — I739 Peripheral vascular disease, unspecified: Secondary | ICD-10-CM

## 2013-05-21 DIAGNOSIS — Z720 Tobacco use: Secondary | ICD-10-CM

## 2013-05-21 NOTE — Patient Instructions (Signed)
Your physician wants you to follow-up in: 6 months.   You will receive a reminder letter in the mail two months in advance. If you don't receive a letter, please call our office to schedule the follow-up appointment.  Your physician has requested that you have a lexiscan myoview. For further information please visit [email or web address]. Please follow instruction sheet, as given.   

## 2013-05-21 NOTE — Progress Notes (Signed)
History of Present Illness: 73 yo male with history of CAD, COPD, HTN, HLD, GERD, PAD, carotid artery disease who is here today for cardiac follow up. His cardiac history is significant for anterior MI in 1997 tx'd with bare metal stent x 2 to the LAD, bare metal stenting to the CFX in 2002 and PCI in 2009 with placement of 2 separate drug eluting stents in the LAD.  Cardiac cath on 04/14/10 with severe stenosis in the distal AV groove Circumflex and the OM. These were both treated with DES. His carotid artery dopplers 12/02/12 in [facility] office showed mild disease in RICA at CEA site and mild disease in the LICA CES site. He is followed by Dr. Lamonte Sakai for COPD. He stopped Losartan 2014 due to dizziness and hypotension.   He is here for follow up. He has been having left sided chest pain, lasts for a few seconds. He has had more indigestion. No exertional chest pains.  No SOB or palpitations. 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 He continues to smoke 1/2   Primary Care Physician: Lindley Magnus [facility]   Last Lipid Profile:Lipid Panel     Component Value Date/Time   CHOL 157 03/06/2013 0811   TRIG 71.0 03/06/2013 0811   HDL 45.60 03/06/2013 0811   CHOLHDL 3 03/06/2013 0811   VLDL 14.2 03/06/2013 0811   LDLCALC 97 03/06/2013 0811     Past Medical History  Diagnosis Date  . COPD (chronic obstructive pulmonary disease)   . CAD (coronary artery disease)     Ant MI 36  . HTN (hypertension)   . Hyperlipidemia   . Osteoarthritis   . Peripheral vascular disease   . Prostate cancer   . Carotid artery disease     Right CEA 2002 per Dr. [name]  . Myocardial infarction   . GERD (gastroesophageal reflux disease)     occ  . CHF (congestive heart failure)     pt denies 09/25/12  . Anxiety   . PAC (premature atrial contraction)   . Pneumonia   . Stroke   . Hydradenitis     Past Surgical History  Procedure Laterality Date  . Coronary stent placement  97, 2000, 2012    8 stents  . Doppler  echocardiography  1995    neg  . Carotid endarterectomy  03-2000    right  . Colonoscopy  04-2001    polyp  . Cardiovascular stress test  12-2006  . Cardiac catheterization    . Vascular surgery    . Endarterectomy  11/24/2011    Procedure: ENDARTERECTOMY CAROTID;  Surgeon: Rosetta Posner, MD;  Location: [facility];  Service: Vascular;  Laterality: Left;    Current Outpatient Prescriptions  Medication Sig Dispense Refill  . ADVAIR DISKUS 500-50 MCG/DOSE AEPB INHALE 1 PUFF INTO THE LUNGS EVERY 12 (TWELVE) HOURS.  60 each  5  . ALPRAZolam (XANAX) 0.5 MG tablet Take 1 tablet (0.5 mg total) by mouth at bedtime as needed. Sleep or anxiety  30 tablet  0  . aspirin 81 MG tablet Take 81 mg by mouth daily.       . clopidogrel (PLAVIX) 75 MG tablet Take 1 tablet (75 mg total) by mouth daily.  30 tablet  2  . loratadine (CLARITIN) 10 MG tablet Take 10 mg by mouth daily.      . metoprolol tartrate (LOPRESSOR) 25 MG tablet TAKE 1/2 TABLET TWICE A DAY  30 tablet  3  . mupirocin  ointment (BACTROBAN) 2 % Place 1 application into the nose 2 (two) times daily.  22 g  0  . nitroGLYCERIN (NITROSTAT) 0.4 MG SL tablet Place 0.4 mg under the tongue every 5 (five) minutes as needed. For chest pain      . pantoprazole (PROTONIX) 40 MG tablet Take 40 mg by mouth daily as needed.      . pravastatin (PRAVACHOL) 40 MG tablet Take one tablet by mouth every other day  15 tablet  6  . SPIRIVA HANDIHALER 18 MCG inhalation capsule INHALE 1 CAPSULE VIA HANDIHALER ONCE DAILY AT THE SAME TIME EVERY DAY  30 capsule  6  . Tamsulosin HCl (FLOMAX) 0.4 MG CAPS Take 0.4 mg by mouth daily.       . traMADol (ULTRAM) 50 MG tablet TAKE 1 TABLET (50 MG TOTAL) BY MOUTH EVERY 6 (SIX) HOURS AS NEEDED FOR PAIN.  50 tablet  2  . VENTOLIN HFA 108 (90 BASE) MCG/ACT inhaler INHALE 2 PUFFS INTO THE LUNGS EVERY 4 (FOUR) HOURS AS NEEDED FOR WHEEZING OR SHORTNESS OF BREATH.  18 each  6   No current facility-administered medications for this visit.     Allergies  Allergen Reactions  . Augmentin [Amoxicillin-Pot Clavulanate] Nausea And Vomiting    Diarrhea  . Sulfonamide Derivatives   . Zantac [Ranitidine Hcl]     diarrhea    History   Social History  . Marital Status: Widowed    Spouse Name: N/A    Number of Children: 59  . Years of Education: N/A   Occupational History  . firefighter   .     Social History Main Topics  . Smoking status: Current Every Day Smoker -- 0.50 packs/day for 55 years    Types: Cigarettes  . Smokeless tobacco: Never Used  . Alcohol Use: Yes     Comment: occ  . Drug Use: No  . Sexual Activity: Not on file   Other Topics Concern  . Not on file   Social History Narrative  . No narrative on file    Family History  Problem Relation Age of Onset  . Stroke Father   . Heart disease Mother     pacemaker  . Other Brother     benign brain tumor  . Liver cancer Brother   . Diabetes Maternal Grandmother     Review of Systems:  As stated in the HPI and otherwise negative.   BP 118/64  Pulse 87  Ht 5\' 10"  (1.778 m)  Wt 193 lb (87.544 kg)  BMI 27.69 kg/m2  Physical Examination: General: Well developed, well nourished, NAD HEENT: OP clear, mucus membranes moist SKIN: warm, dry. No rashes. Neuro: No focal deficits Musculoskeletal: Muscle strength 5/5 all ext Psychiatric: Mood and affect normal Neck: No JVD, no carotid bruits, no thyromegaly, no lymphadenopathy. Lungs:Clear bilaterally, no wheezes, rhonci, crackles Cardiovascular: Regular rate and rhythm. No murmurs, gallops or rubs. Abdomen:Soft. Bowel sounds present. Non-tender.  Extremities: No lower extremity edema. Pulses are 2 + in the bilateral DP/PT.  Assessment and Plan:   1. CAD: Recent chest pains and ongoing tobacco abuse. He is known to have obstructive CAD with multiple stents. Will arrange Lexiscan stress myoview to exclude ischemia. He cannot walk on the treadmill due to leg pain. He is on good medical therapy. Will  continue dual anti-platelet therapy with ASA and Plavix since has multiple stents.  Continue statin.     2. Carotid artery disease: Stable s/p bilateral CEA. Followed by  Dr. [name]. Recent carotid dopplers 12/02/12 in [facility] office.   3. Tobacco abuse: Smoking cessation encouraged.   4. Hyperlipidemia: Continue statin. Lipids as above, LDL is below 100. He does not tolerate high doses of statins and is using Pravastatin every other day.

## 2013-05-26 ENCOUNTER — Other Ambulatory Visit: Payer: Self-pay | Admitting: Emergency Medicine

## 2013-06-04 ENCOUNTER — Encounter: Payer: Self-pay | Admitting: Emergency Medicine

## 2013-06-04 ENCOUNTER — Ambulatory Visit (INDEPENDENT_AMBULATORY_CARE_PROVIDER_SITE_OTHER): Payer: Medicare Other | Admitting: Emergency Medicine

## 2013-06-04 VITALS — BP 140/72 | HR 93 | Ht 70.0 in | Wt 194.0 lb

## 2013-06-04 DIAGNOSIS — J449 Chronic obstructive pulmonary disease, unspecified: Secondary | ICD-10-CM

## 2013-06-04 DIAGNOSIS — J4489 Other specified chronic obstructive pulmonary disease: Secondary | ICD-10-CM

## 2013-06-04 NOTE — Progress Notes (Signed)
Subjective:  Patient ID: [name], male    DOB: 1941/01/17, 73 y.o.   MRN: [id number] HPI 73 yo smoker (~100pk-yrs), hx of CAD/PTCI, HTN,  prostate CA. Dx with COPD around 10 yrs ago. Last seen 2007 by PW. Maintanance was Advair bid. Previously on Spiriva, stopped in 2008.   ROV 12/29/10 -- Severe COPD, CAD (PTCI by Dr [name]). Walking oximetry last visit showed desats but he didn't want to start O2. Tells me that he was seen by Dr [name] last week, exertional SOB, CP, weakness. He underwent L cath with PTCI. He reports continued DOE, but CP has resolved, weakness is better. Last time we did experiment changing Advair to Symbicort - he believes that it is at least as good, not necessarily better. Still on Spiriva. He has not restarted quinipril. We treated him prednisone after his last visit here, he says it helped his stamina, may have made breathing better. Smoking 5-10 cig a day.   ROV 03/08/11 -- Severe COPD, documented hypoxemia but has not wanted O2. Had been well until developed URI about 2 weeks ago. Has had progressive SOB, wheeze, cough. Prod of green/brown, sometimes clear. He stopped smoking about 1 weeks ago.  Remains on Spiriva, went back to Advair.   ROV 04/08/11 -- Severe COPD, documented hypoxemia but has not wanted O2. Reports that he has restarted smoking - about 1/3 of a pack daily. Did not tolerate Chantix. He is improved after rx with doxy and pred 1 months ago. Some cough, less freq - clear sputum. Remains on Spiriva and Advair 500/50.   ROV 07/14/11 -- Severe COPD, documented hypoxemia but has not wanted O2. Remains on Spiriva and Advair 500/50. DuoNebs prn, hasn't needed since last visit. Has been using albuterol HFA about once a day. Still smoking. Unfortunately his wife died 05-19-22, quitting tobacco hasn't been possible yet. He wants to quit again, but not ready to set quit date. He is still working.   ROV 10/26/11 -- Hx CAD, Severe COPD, documented hypoxemia but has not  wanted O2. Remains on Spiriva and Advair 500/50. DuoNebs or albuterol prn. Still smoking 3/4 pk a day. Remains on Spiriva + Advair. No AE since last visit. Able to work part-time, has difficulty w yard work.  Planning for L carotid sgy with Dr [name]. Some increased cough last 3 -4 days, more mucous. No change in dyspnea or wheeze.   ROV 03/17/12 -- Hx CAD, Severe COPD, documented hypoxemia but has not wanted O2. Remains on Spiriva and Advair 500/50. DuoNebs or albuterol prn >> uses it 1 -2 x a day. 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 Has had some dry cough last few days.  He is starting the e-cig, hasn't had a cigarette in 2-3 days.   ROV 07/17/12 -- Hx CAD, Severe COPD, documented hypoxemia (not on O2). He has not smoked since last time - has instead been using e-cig. Discussed the pro's/con's of this. No flares, no bronchitis. He has some exertional wheeze. His cough has increased with the allergy season. He is on loratadine.   ROV 11/17/12 -- Hx CAD, Severe COPD, documented hypoxemia (not on O2). He restarted smoking about 2 months ago. He notes that the e cig irritated him and made cough. He is smoking 1/2 pk a day. Cough has been a bit more x 2 weeks. Productive of clear mucous, worst in the am. Still on Advair + Spiriva.  Uses albuterol 2-3x a day. Remains on  loratadine. He had tried chantix before > had side effects. Breathing is stable, some exertional SOB.   03/09/13 -- Hx CAD, Severe COPD, documented hypoxemia (not on O2).  He has had an increased cough for about 2-3 weeks.  He is on Spiriva and Advair, needed albuterol a few times which is unusual.   ROV 04/06/13 -- severe COPD, seen for an AE in mid December. He got better on pred, then a little more cough. Still smoking. Returns today describing cough and mucous, not as bad as the exacerbation.    ROV 06/04/13 -- severe COPD, also CAD. He reports today that he has had some vertigo that feels like spinning. Happens  when he changes position. He has had this before several yrs ago. He is on advair + spiriva. He has had some episodes of dyspnea, had to use his neb x 2 last week. Otherwise doing well. He is still smoking 0.5 pk a day.    CAT Score 03/17/2012  Total CAT Score 10    Objective:  Physical Exam Filed Vitals:   06/04/13 1708  BP: 140/72  Pulse: 93   Gen: Pleasant, obese man, in no distress,  normal affect  ENT: No lesions,  mouth clear,  oropharynx clear, no postnasal drip  Neck: No JVD, no TMG, no carotid bruits  Lungs: No use of accessory muscles, no dullness to percussion, Coarse BS w/ few rhonchi, few scattered wheezes  Cardiovascular: RRR, heart sounds normal, no murmur or gallops, no peripheral edema  Musculoskeletal: No deformities, no cyanosis or clubbing  Neuro: alert, non focal  Skin: Warm, no lesions or rashes   Assessment & Plan:  COPD Doing well. No flares since last time. Stable on Spiriva + Advair.   Please continue your Spiriva and Advair as you are taking them  Use your albuterol as needed Follow with Dr Glori Bickers if your vertigo persists Follow with Dr Lamonte Sakai in 4 months or sooner if you have any problems.

## 2013-06-04 NOTE — Assessment & Plan Note (Addendum)
Doing well. No flares since last time. Stable on Spiriva + Advair.   Please continue your Spiriva and Advair as you are taking them  Use your albuterol as needed Follow with Dr Glori Bickers if your vertigo persists Follow with Dr Lamonte Sakai in 4 months or sooner if you have any problems.

## 2013-06-04 NOTE — Patient Instructions (Signed)
Please continue your Spiriva and Advair as you are taking them  Use your albuterol as needed Follow with Dr Glori Bickers if your vertigo persists Follow with Dr Lamonte Sakai in 4 months or sooner if you have any problems.

## 2013-06-16 ENCOUNTER — Other Ambulatory Visit: Payer: Self-pay | Admitting: Emergency Medicine

## 2013-06-17 ENCOUNTER — Other Ambulatory Visit: Payer: Self-pay | Admitting: Cardiovascular Disease

## 2013-06-18 ENCOUNTER — Encounter (HOSPITAL_COMMUNITY): Payer: Medicare Other

## 2013-07-09 ENCOUNTER — Other Ambulatory Visit: Payer: Self-pay | Admitting: Family Medicine

## 2013-07-17 ENCOUNTER — Other Ambulatory Visit: Payer: Self-pay | Admitting: Cardiovascular Disease

## 2013-07-20 ENCOUNTER — Other Ambulatory Visit: Payer: Self-pay | Admitting: Dermatology

## 2013-10-08 ENCOUNTER — Other Ambulatory Visit: Payer: Self-pay | Admitting: Emergency Medicine

## 2013-10-17 ENCOUNTER — Other Ambulatory Visit: Payer: Self-pay | Admitting: Emergency Medicine

## 2013-10-22 ENCOUNTER — Ambulatory Visit: Payer: Self-pay | Admitting: Ophthalmology

## 2013-11-01 ENCOUNTER — Other Ambulatory Visit: Payer: Self-pay | Admitting: Family Medicine

## 2013-11-05 ENCOUNTER — Ambulatory Visit: Payer: Self-pay | Admitting: Ophthalmology

## 2013-11-05 HISTORY — PX: EYE SURGERY: SHX253

## 2013-12-10 ENCOUNTER — Ambulatory Visit (INDEPENDENT_AMBULATORY_CARE_PROVIDER_SITE_OTHER): Payer: Medicare Other | Admitting: Cardiovascular Disease

## 2013-12-10 ENCOUNTER — Encounter: Payer: Self-pay | Admitting: Cardiovascular Disease

## 2013-12-10 VITALS — BP 130/56 | HR 83 | Ht 70.0 in | Wt 192.0 lb

## 2013-12-10 DIAGNOSIS — I739 Peripheral vascular disease, unspecified: Secondary | ICD-10-CM

## 2013-12-10 DIAGNOSIS — R5381 Other malaise: Secondary | ICD-10-CM

## 2013-12-10 DIAGNOSIS — I2510 Atherosclerotic heart disease of native coronary artery without angina pectoris: Secondary | ICD-10-CM

## 2013-12-10 DIAGNOSIS — F17200 Nicotine dependence, unspecified, uncomplicated: Secondary | ICD-10-CM

## 2013-12-10 DIAGNOSIS — I779 Disorder of arteries and arterioles, unspecified: Secondary | ICD-10-CM

## 2013-12-10 DIAGNOSIS — E785 Hyperlipidemia, unspecified: Secondary | ICD-10-CM

## 2013-12-10 DIAGNOSIS — Z720 Tobacco use: Secondary | ICD-10-CM

## 2013-12-10 DIAGNOSIS — R5383 Other fatigue: Secondary | ICD-10-CM

## 2013-12-10 DIAGNOSIS — R5382 Chronic fatigue, unspecified: Secondary | ICD-10-CM

## 2013-12-10 LAB — LIPID PANEL
CHOL/HDL RATIO: 4
Cholesterol: 169 mg/dL (ref 0–200)
HDL: 46.6 mg/dL (ref >39.00)
LDL CALC: 98 mg/dL (ref 0–99)
NonHDL: 122.4
Triglycerides: 124 mg/dL (ref 0.0–149.0)
VLDL: 24.8 mg/dL (ref 0.0–40.0)

## 2013-12-10 LAB — BASIC METABOLIC PANEL
BUN: 17 mg/dL (ref 6–23)
CHLORIDE: 103 meq/L (ref 96–112)
CO2: 30 meq/L (ref 19–32)
Calcium: 10.4 mg/dL (ref 8.4–10.5)
Creatinine, Ser: 1.1 mg/dL (ref 0.4–1.5)
GFR: 68.22 mL/min (ref >60.00)
Glucose, Bld: 113 mg/dL — ABNORMAL HIGH (ref 70–99)
POTASSIUM: 3.6 meq/L (ref 3.5–5.1)
SODIUM: 142 meq/L (ref 135–145)

## 2013-12-10 LAB — HEPATIC FUNCTION PANEL
ALBUMIN: 4.4 g/dL (ref 3.5–5.2)
ALT: 12 U/L (ref 0–53)
AST: 21 U/L (ref 0–37)
Alkaline Phosphatase: 41 U/L (ref 39–117)
BILIRUBIN TOTAL: 0.4 mg/dL (ref 0.2–1.2)
Bilirubin, Direct: 0 mg/dL (ref 0.0–0.3)
Total Protein: 7.7 g/dL (ref 6.0–8.3)

## 2013-12-10 LAB — CBC
HCT: 46 % (ref 39.0–52.0)
Hemoglobin: 15.5 g/dL (ref 13.0–17.0)
MCHC: 33.7 g/dL (ref 30.0–36.0)
MCV: 100.9 fl — AB (ref 78.0–100.0)
Platelets: 261 10*3/uL (ref 150.0–400.0)
RBC: 4.56 Mil/uL (ref 4.22–5.81)
RDW: 13.8 % (ref 11.5–15.5)
WBC: 7.6 10*3/uL (ref 4.0–10.5)

## 2013-12-10 LAB — TSH: TSH: 1.1 u[IU]/mL (ref 0.35–4.50)

## 2013-12-10 NOTE — Progress Notes (Signed)
History of Present Illness: 73 yo male with history of CAD, COPD, HTN, HLD, GERD, PAD, carotid artery disease who is here today for cardiac follow up. His cardiac history is significant for anterior MI in 1997 tx'd with bare metal stent x 2 to the LAD, bare metal stenting to the CFX in 2002 and PCI in 2009 with placement of 2 separate drug eluting stents in the LAD.  Cardiac cath on 04/14/10 with severe stenosis in the distal AV groove Circumflex and the OM. These were both treated with DES. His carotid artery dopplers 12/02/12 in [facility] office showed mild disease in RICA at CEA site and mild disease in the LICA CES site. He is followed by Dr. Lamonte Sakai for COPD. He stopped Losartan 2014 due to dizziness and hypotension. He had c/o chest pain at his last visit here in March 2015. I arranged a stress test but he cancelled this test.   He is here for follow up. He denies chest pain or change in breathing. Still smoking. Describes constant fatigue. No chills, fever, rigors. Chronic cough. He continues to smoke 1/2 ppd.   Primary Care Physician: Lindley Magnus Tower   Last Lipid Profile:Lipid Panel     Component Value Date/Time   CHOL 157 03/06/2013 0811   TRIG 71.0 03/06/2013 0811   HDL 45.60 03/06/2013 0811   CHOLHDL 3 03/06/2013 0811   VLDL 14.2 03/06/2013 0811   LDLCALC 97 03/06/2013 0811     Past Medical History  Diagnosis Date  . COPD (chronic obstructive pulmonary disease)   . CAD (coronary artery disease)     Ant MI 15  . HTN (hypertension)   . Hyperlipidemia   . Osteoarthritis   . Peripheral vascular disease   . Prostate cancer   . Carotid artery disease     Right CEA 2002 per Dr. [name]  . Myocardial infarction   . GERD (gastroesophageal reflux disease)     occ  . CHF (congestive heart failure)     pt denies 09/25/12  . Anxiety   . PAC (premature atrial contraction)   . Pneumonia   . Stroke   . Hydradenitis     Past Surgical History  Procedure Laterality Date  . Coronary  stent placement  97, 2000, 2012    8 stents  . Doppler echocardiography  1995    neg  . Carotid endarterectomy  03-2000    right  . Colonoscopy  04-2001    polyp  . Cardiovascular stress test  12-2006  . Cardiac catheterization    . Vascular surgery    . Endarterectomy  11/24/2011    Procedure: ENDARTERECTOMY CAROTID;  Surgeon: Rosetta Posner, MD;  Location: [facility] OR;  Service: Vascular;  Laterality: Left;    Current Outpatient Prescriptions  Medication Sig Dispense Refill  . ALPRAZolam (XANAX) 0.5 MG tablet Take 1 tablet (0.5 mg total) by mouth at bedtime as needed. Sleep or anxiety  30 tablet  0  . aspirin 81 MG tablet Take 81 mg by mouth daily.       . clopidogrel (PLAVIX) 75 MG tablet Take 1 tablet (75 mg total) by mouth daily.  30 tablet  2  . Fluticasone-Salmeterol (ADVAIR DISKUS) 500-50 MCG/DOSE AEPB Inhale 1 puff into the lungs every 12 (twelve) hours. * Please schedule a follow up appointment with Dr. [name] soon*  60 each  3  . loratadine (CLARITIN) 10 MG tablet Take 10 mg by mouth daily.      Marland Kitchen  metoprolol tartrate (LOPRESSOR) 25 MG tablet TAKE 1/2 TABLET TWICE A DAY  30 tablet  3  . nitroGLYCERIN (NITROSTAT) 0.4 MG SL tablet Place 0.4 mg under the tongue every 5 (five) minutes as needed. For chest pain      . pantoprazole (PROTONIX) 40 MG tablet Take 40 mg by mouth daily as needed.      . pravastatin (PRAVACHOL) 40 MG tablet TAKE ONE TABLET BY MOUTH EVERY OTHER DAY  15 tablet  3  . SPIRIVA HANDIHALER 18 MCG inhalation capsule INHALE 1 CAPSULE VIA HANDIHALER ONCE DAILY AT THE SAME TIME EVERY DAY  30 capsule  6  . Tamsulosin HCl (FLOMAX) 0.4 MG CAPS Take 0.4 mg by mouth daily.       . VENTOLIN HFA 108 (90 BASE) MCG/ACT inhaler INHALE 2 PUFFS INTO THE LUNGS EVERY 4 (FOUR) HOURS AS NEEDED FOR WHEEZING OR SHORTNESS OF BREATH.  18 each  6   No current facility-administered medications for this visit.    Allergies  Allergen Reactions  . Augmentin [Amoxicillin-Pot Clavulanate] Nausea And  Vomiting    Diarrhea  . Sulfonamide Derivatives   . Zantac [Ranitidine Hcl]     diarrhea    History   Social History  . Marital Status: Widowed    Spouse Name: N/A    Number of Children: 69  . Years of Education: N/A   Occupational History  . firefighter   .     Social History Main Topics  . Smoking status: Current Every Day Smoker -- 0.50 packs/day for 55 years    Types: Cigarettes  . Smokeless tobacco: Never Used  . Alcohol Use: Yes     Comment: occ  . Drug Use: No  . Sexual Activity: Not on file   Other Topics Concern  . Not on file   Social History Narrative  . No narrative on file    Family History  Problem Relation Age of Onset  . Stroke Father   . Heart disease Mother     pacemaker  . Other Brother     benign brain tumor  . Liver cancer Brother   . Diabetes Maternal Grandmother     Review of Systems:  As stated in the HPI and otherwise negative.   BP 130/56  Pulse 83  Ht 5\' 10"  (1.778 m)  Wt 192 lb (87.091 kg)  BMI 27.55 kg/m2  SpO2 97%  Physical Examination: General: Well developed, well nourished, NAD HEENT: OP clear, mucus membranes moist SKIN: warm, dry. No rashes. Neuro: No focal deficits Musculoskeletal: Muscle strength 5/5 all ext Psychiatric: Mood and affect normal Neck: No JVD, no carotid bruits, no thyromegaly, no lymphadenopathy. Lungs:Clear bilaterally, no wheezes, rhonci, crackles Cardiovascular: Regular rate and rhythm. No murmurs, gallops or rubs. Abdomen:Soft. Bowel sounds present. Non-tender.  Extremities: No lower extremity edema. Pulses are 2 + in the bilateral DP/PT.  Assessment and Plan:   1. CAD:  Stable. No chest pain. He is on good medical therapy. Will continue dual anti-platelet therapy with ASA and Plavix since has multiple stents.  Continue statin.     2. Carotid artery disease: Stable s/p bilateral CEA. Followed by Dr. [name] in [facility].   3. Tobacco abuse: Smoking cessation encouraged.   4. Hyperlipidemia:  Continue statin. Lipids as above, LDL is below 100. He does not tolerate high doses of statins but is tolerating Pravastatin. Repeat lipids and LFTs now.   5. Fatigue, chronic: Not c/w prior angina. Will check BMET, CBC, TSH. Follow  up in primary care

## 2013-12-10 NOTE — Patient Instructions (Signed)
Your physician wants you to follow-up in:  6 months. You will receive a reminder letter in the mail two months in advance. If you don't receive a letter, please call our office to schedule the follow-up appointment.   

## 2013-12-11 ENCOUNTER — Telehealth: Payer: Self-pay | Admitting: *Deleted

## 2013-12-11 ENCOUNTER — Other Ambulatory Visit: Payer: Self-pay | Admitting: Cardiovascular Disease

## 2013-12-11 DIAGNOSIS — E785 Hyperlipidemia, unspecified: Secondary | ICD-10-CM

## 2013-12-11 NOTE — Telephone Encounter (Signed)
Per Dr. [name] LDL not at goal. He would like pt to increase Pravastatin to 40 mg daily if able to tolerate it. Pt has been taking every other day.  All other labs OK. I placed call to pt and left message to call back

## 2013-12-12 MED ORDER — PRAVASTATIN SODIUM 40 MG PO TABS: 40.0000 mg | ORAL_TABLET | Freq: Every day | ORAL | Status: DC

## 2013-12-12 NOTE — Telephone Encounter (Signed)
Spoke with pt and reviewed lab results and recommendations from Dr. [name] with him. He will try to take Pravastatin daily. He will come in the week of March 04, 2014 for fasting labs. Will send new prescription to [facility] on [address] in [place].

## 2013-12-12 NOTE — Telephone Encounter (Signed)
Follow Up   Pt called to follow up. Please call

## 2013-12-19 ENCOUNTER — Encounter: Payer: Self-pay | Admitting: Family

## 2013-12-20 ENCOUNTER — Encounter: Payer: Self-pay | Admitting: Family

## 2013-12-20 ENCOUNTER — Ambulatory Visit (INDEPENDENT_AMBULATORY_CARE_PROVIDER_SITE_OTHER): Payer: Medicare Other | Admitting: Family

## 2013-12-20 ENCOUNTER — Ambulatory Visit (HOSPITAL_COMMUNITY)
Admission: RE | Admit: 2013-12-20 | Discharge: 2013-12-20 | Disposition: A | Discharge status: Home / Self Care | Payer: Medicare Other | Source: Ambulatory Visit | Attending: Family | Admitting: Family

## 2013-12-20 VITALS — BP 156/81 | HR 69 | Resp 16 | Ht 70.0 in | Wt 193.0 lb

## 2013-12-20 DIAGNOSIS — Z48812 Encounter for surgical aftercare following surgery on the circulatory system: Secondary | ICD-10-CM | POA: Diagnosis not present

## 2013-12-20 DIAGNOSIS — Z7189 Other specified counseling: Secondary | ICD-10-CM | POA: Insufficient documentation

## 2013-12-20 DIAGNOSIS — I6529 Occlusion and stenosis of unspecified carotid artery: Secondary | ICD-10-CM | POA: Diagnosis not present

## 2013-12-20 DIAGNOSIS — I739 Peripheral vascular disease, unspecified: Secondary | ICD-10-CM

## 2013-12-20 DIAGNOSIS — I70219 Atherosclerosis of native arteries of extremities with intermittent claudication, unspecified extremity: Secondary | ICD-10-CM

## 2013-12-20 DIAGNOSIS — I6523 Occlusion and stenosis of bilateral carotid arteries: Secondary | ICD-10-CM

## 2013-12-20 NOTE — Patient Instructions (Signed)
Stroke Prevention Some medical conditions and behaviors are associated with an increased chance of having a stroke. You may prevent a stroke by making healthy choices and managing medical conditions. HOW CAN I REDUCE MY RISK OF HAVING A STROKE?   Stay physically active. Get at least 30 minutes of activity on most or all days.  Do not smoke. It may also be helpful to avoid exposure to secondhand smoke.  Limit alcohol use. Moderate alcohol use is considered to be:  No more than 2 drinks per day for men.  No more than 1 drink per day for nonpregnant women.  Eat healthy foods. This involves:  Eating 5 or more servings of fruits and vegetables a day.  Making dietary changes that address high blood pressure (hypertension), high cholesterol, diabetes, or obesity.  Manage your cholesterol levels.  Making food choices that are high in fiber and low in saturated fat, trans fat, and cholesterol may control cholesterol levels.  Take any prescribed medicines to control cholesterol as directed by your health care provider.  Manage your diabetes.  Controlling your carbohydrate and sugar intake is recommended to manage diabetes.  Take any prescribed medicines to control diabetes as directed by your health care provider.  Control your hypertension.  Making food choices that are low in salt (sodium), saturated fat, trans fat, and cholesterol is recommended to manage hypertension.  Take any prescribed medicines to control hypertension as directed by your health care provider.  Maintain a healthy weight.  Reducing calorie intake and making food choices that are low in sodium, saturated fat, trans fat, and cholesterol are recommended to manage weight.  Stop drug abuse.  Avoid taking birth control pills.  Talk to your health care provider about the risks of taking birth control pills if you are over 35 years old, smoke, get migraines, or have ever had a blood clot.  Get evaluated for sleep  disorders (sleep apnea).  Talk to your health care provider about getting a sleep evaluation if you snore a lot or have excessive sleepiness.  Take medicines only as directed by your health care provider.  For some people, aspirin or blood thinners (anticoagulants) are helpful in reducing the risk of forming abnormal blood clots that can lead to stroke. If you have the irregular heart rhythm of atrial fibrillation, you should be on a blood thinner unless there is a good reason you cannot take them.  Understand all your medicine instructions.  Make sure that other conditions (such as anemia or atherosclerosis) are addressed. SEEK IMMEDIATE MEDICAL CARE IF:   You have sudden weakness or numbness of the face, arm, or leg, especially on one side of the body.  Your face or eyelid droops to one side.  You have sudden confusion.  You have trouble speaking (aphasia) or understanding.  You have sudden trouble seeing in one or both eyes.  You have sudden trouble walking.  You have dizziness.  You have a loss of balance or coordination.  You have a sudden, severe headache with no known cause.  You have new chest pain or an irregular heartbeat. Any of these symptoms may represent a serious problem that is an emergency. Do not wait to see if the symptoms will go away. Get medical help at once. Call your local emergency services (911 in [place] Do not drive yourself to the hospital. Document Released: 04/15/2004 Document Revised: 07/23/2013 Document Reviewed: 09/08/2012 Exit[employer] Patient Information 2015 [employer]. This information is not intended to replace advice given   to you by your health care provider. Make sure you discuss any questions you have with your health care provider.   Smoking Cessation Quitting smoking is important to your health and has many advantages. However, it is not always easy to quit since nicotine is a very addictive drug. Oftentimes, people try 3 times or more  before being able to quit. This document explains the best ways for you to prepare to quit smoking. Quitting takes hard work and a lot of effort, but you can do it. ADVANTAGES OF QUITTING SMOKING  You will live longer, feel better, and live better.  Your body will feel the impact of quitting smoking almost immediately.  Within 20 minutes, blood pressure decreases. Your pulse returns to its normal level.  After 8 hours, carbon monoxide levels in the blood return to normal. Your oxygen level increases.  After 24 hours, the chance of having a heart attack starts to decrease. Your breath, hair, and body stop smelling like smoke.  After 48 hours, damaged nerve endings begin to recover. Your sense of taste and smell improve.  After 72 hours, the body is virtually free of nicotine. Your bronchial tubes relax and breathing becomes easier.  After 2 to 12 weeks, lungs can hold more air. Exercise becomes easier and circulation improves.  The risk of having a heart attack, stroke, cancer, or lung disease is greatly reduced.  After 1 year, the risk of coronary heart disease is cut in half.  After 5 years, the risk of stroke falls to the same as a nonsmoker.  After 10 years, the risk of lung cancer is cut in half and the risk of other cancers decreases significantly.  After 15 years, the risk of coronary heart disease drops, usually to the level of a nonsmoker.  If you are pregnant, quitting smoking will improve your chances of having a healthy baby.  The people you live with, especially any children, will be healthier.  You will have extra money to spend on things other than cigarettes. QUESTIONS TO THINK ABOUT BEFORE ATTEMPTING TO QUIT You may want to talk about your answers with your health care provider.  Why do you want to quit?  If you tried to quit in the past, what helped and what did not?  What will be the most difficult situations for you after you quit? How will you plan to  handle them?  Who can help you through the tough times? Your family? Friends? A health care provider?  What pleasures do you get from smoking? What ways can you still get pleasure if you quit? Here are some questions to ask your health care provider:  How can you help me to be successful at quitting?  What medicine do you think would be best for me and how should I take it?  What should I do if I need more help?  What is smoking withdrawal like? How can I get information on withdrawal? GET READY  Set a quit date.  Change your environment by getting rid of all cigarettes, ashtrays, matches, and lighters in your home, car, or work. Do not let people smoke in your home.  Review your past attempts to quit. Think about what worked and what did not. GET SUPPORT AND ENCOURAGEMENT You have a better chance of being successful if you have help. You can get support in many ways.  Tell your family, friends, and coworkers that you are going to quit and need their support. Ask them not   to smoke around you.  Get individual, group, or telephone counseling and support. Programs are available at local hospitals and health centers. Call your local health department for information about programs in your area.  Spiritual beliefs and practices may help some smokers quit.  Download a "quit meter" on your computer to keep track of quit statistics, such as how long you have gone without smoking, cigarettes not smoked, and money saved.  Get a self-help book about quitting smoking and staying off tobacco. LEARN NEW SKILLS AND BEHAVIORS  Distract yourself from urges to smoke. Talk to someone, go for a walk, or occupy your time with a task.  Change your normal routine. Take a different route to work. Drink tea instead of coffee. Eat breakfast in a different place.  Reduce your stress. Take a hot bath, exercise, or read a book.  Plan something enjoyable to do every day. Reward yourself for not  smoking.  Explore interactive web-based programs that specialize in helping you quit. GET MEDICINE AND USE IT CORRECTLY Medicines can help you stop smoking and decrease the urge to smoke. Combining medicine with the above behavioral methods and support can greatly increase your chances of successfully quitting smoking.  Nicotine replacement therapy helps deliver nicotine to your body without the negative effects and risks of smoking. Nicotine replacement therapy includes nicotine gum, lozenges, inhalers, nasal sprays, and skin patches. Some may be available over-the-counter and others require a prescription.  Antidepressant medicine helps people abstain from smoking, but how this works is unknown. This medicine is available by prescription.  Nicotinic receptor partial agonist medicine simulates the effect of nicotine in your brain. This medicine is available by prescription. Ask your health care provider for advice about which medicines to use and how to use them based on your health history. Your health care provider will tell you what side effects to look out for if you choose to be on a medicine or therapy. Carefully read the information on the package. Do not use any other product containing nicotine while using a nicotine replacement product.  RELAPSE OR DIFFICULT SITUATIONS Most relapses occur within the first 3 months after quitting. Do not be discouraged if you start smoking again. Remember, most people try several times before finally quitting. You may have symptoms of withdrawal because your body is used to nicotine. You may crave cigarettes, be irritable, feel very hungry, cough often, get headaches, or have difficulty concentrating. The withdrawal symptoms are only temporary. They are strongest when you first quit, but they will go away within 10-14 days. To reduce the chances of relapse, try to:  Avoid drinking alcohol. Drinking lowers your chances of successfully quitting.  Reduce the  amount of caffeine you consume. Once you quit smoking, the amount of caffeine in your body increases and can give you symptoms, such as a rapid heartbeat, sweating, and anxiety.  Avoid smokers because they can make you want to smoke.  Do not let weight gain distract you. Many smokers will gain weight when they quit, usually less than 10 pounds. Eat a healthy diet and stay active. You can always lose the weight gained after you quit.  Find ways to improve your mood other than smoking. FOR MORE INFORMATION  www.smokefree.gov  Document Released: 03/02/2001 Document Revised: 07/23/2013 Document Reviewed: 06/17/2011 Exit[employer] Patient Information 2015 [employer]. This information is not intended to replace advice given to you by your health care provider. Make sure you discuss any questions you have with your health care   provider.  

## 2013-12-20 NOTE — Progress Notes (Signed)
Established Carotid Patient   History of Present Illness  [name] is a 73 y.o. male patient of Dr. [name] who presents for scheduled follow up of bilateral carotid endarterectomies. Initially this was the right carotid in 2002 and he underwent left carotid endarterectomy in September of 2013.   His PMHx is significant for 8 cardiac stents. Unfortunately he has resumed smoking, but he is now motivated to quit, has tried Chantix, caused overly vivid dreams, he does not want to have suicidal feelings that Chantix may cause. Dr. [name] is his cardiologist.   Patient has Positive history of TIA Symptom perioperatively and states the numbness in his right 5th finger is about the same, no other neurological deficits. The patient denies amaurosis fugax or monocular blindness. The patient denies facial drooping. He denies any further stroke or TIA symptoms.  Pt. denies hemiplegia. The patient denies receptive or expressive aphasia. Pt. denies extremity weakness.  Patient denies chest pain put reports dyspnea with exertion, he denies tightness in muscles of legs with walking but muscles of legs feels tired after walking on a steep incline for about 10 minutes.  The patient's previous neurologic deficits are Unchanged.  Patient reports New Medical or Surgical History: left cataract extraction, will have the right eye cataract extracted soon.  He states he has been more short of breath in the last few weeks and is due to see Dr. [name], his pulmonologist in the next few weeks.   Pt Diabetic: No  Pt smoker: smoker (1 ppd x 54 yrs)   Pt meds include:  Statin : Yes  Betablocker: Yes  ASA: Yes  Other anticoagulants/antiplatelets: Plavix    Past Medical History  Diagnosis Date  . COPD (chronic obstructive pulmonary disease)   . CAD (coronary artery disease)     Ant MI 97  . HTN (hypertension)   . Hyperlipidemia   . Osteoarthritis   . Peripheral vascular disease   . Prostate cancer   .  Carotid artery disease     Right CEA 2002 per Dr. [name]  . Myocardial infarction   . GERD (gastroesophageal reflux disease)     occ  . CHF (congestive heart failure)     pt denies 09/25/12  . Anxiety   . PAC (premature atrial contraction)   . Pneumonia   . Hydradenitis   . Stroke 11-24-11    Social History History  Substance Use Topics  . Smoking status: Current Every Day Smoker -- 1.00 packs/day for 55 years    Types: Cigarettes  . Smokeless tobacco: Never Used  . Alcohol Use: Yes     Comment: occ    Family History Family History  Problem Relation Age of Onset  . Stroke Father   . Heart disease Father   . Heart disease Mother     pacemaker  . Other Brother     benign brain tumor  . Cancer Brother     Eye-behind  . Liver cancer Brother   . Diabetes Maternal Grandmother   . Cancer Sister     Breast    Surgical History Past Surgical History  Procedure Laterality Date  . Coronary stent placement  97, 2000, 2012    8 stents  . Doppler echocardiography  1995    neg  . Colonoscopy  04-2001    polyp  . Cardiovascular stress test  12-2006  . Cardiac catheterization    . Vascular surgery    . Endarterectomy  11/24/2011    Procedure: ENDARTERECTOMY  CAROTID;  Surgeon: Rosetta Posner, MD;  Location: [facility];  Service: Vascular;  Laterality: Left;  Marland Kitchen Eye surgery Left Aug. 17, 2015    Cataract  . Carotid endarterectomy Right 03-2000    CE  . Carotid endarterectomy Left 11-24-11    CE  . Prostate surgery      Radiation Tx  X's 40    Allergies  Allergen Reactions  . Augmentin [Amoxicillin-Pot Clavulanate] Nausea And Vomiting    Diarrhea  . Sulfonamide Derivatives   . Zantac [Ranitidine Hcl]     diarrhea    Current Outpatient Prescriptions  Medication Sig Dispense Refill  . ALPRAZolam (XANAX) 0.5 MG tablet Take 1 tablet (0.5 mg total) by mouth at bedtime as needed. Sleep or anxiety  30 tablet  0  . aspirin 81 MG tablet Take 81 mg by mouth daily.       . clopidogrel  (PLAVIX) 75 MG tablet Take 1 tablet (75 mg total) by mouth daily.  30 tablet  2  . clopidogrel (PLAVIX) 75 MG tablet TAKE 1 TABLET BY MOUTH EVERY DAY  30 tablet  5  . Fluticasone-Salmeterol (ADVAIR DISKUS) 500-50 MCG/DOSE AEPB Inhale 1 puff into the lungs every 12 (twelve) hours. * Please schedule a follow up appointment with Dr. [name] soon*  60 each  3  . loratadine (CLARITIN) 10 MG tablet Take 10 mg by mouth daily.      . metoprolol tartrate (LOPRESSOR) 25 MG tablet TAKE 1/2 TABLET TWICE A DAY  30 tablet  3  . nitroGLYCERIN (NITROSTAT) 0.4 MG SL tablet Place 0.4 mg under the tongue every 5 (five) minutes as needed. For chest pain      . pantoprazole (PROTONIX) 40 MG tablet Take 40 mg by mouth daily as needed.      . pravastatin (PRAVACHOL) 40 MG tablet Take 1 tablet (40 mg total) by mouth daily.  30 tablet  11  . SPIRIVA HANDIHALER 18 MCG inhalation capsule INHALE 1 CAPSULE VIA HANDIHALER ONCE DAILY AT THE SAME TIME EVERY DAY  30 capsule  6  . Tamsulosin HCl (FLOMAX) 0.4 MG CAPS Take 0.4 mg by mouth daily.       . VENTOLIN HFA 108 (90 BASE) MCG/ACT inhaler INHALE 2 PUFFS INTO THE LUNGS EVERY 4 (FOUR) HOURS AS NEEDED FOR WHEEZING OR SHORTNESS OF BREATH.  18 each  6   No current facility-administered medications for this visit.    Review of Systems : See HPI for pertinent positives and negatives.  Physical Examination  Filed Vitals:   12/20/13 1009 12/20/13 1011  BP: 146/86 156/81  Pulse: 72 69  Resp:  16  Height:  5\' 10"  (1.778 m)  Weight:  193 lb (87.544 kg)  SpO2:  96%   Body mass index is 27.69 kg/(m^2).  General: WDWN male in NAD  GAIT: normal  Eyes: PERRLA  Pulmonary: CTAB, Negative Rales, Negative rhonchi, & Positive expiratory wheezing in posterior fields. Positive for chronic loose cough.  Cardiac: regular Rhythm , Negative Murmurs, no peripheral edema.   VASCULAR EXAM  Carotid Bruits  Left  Right    Negative  Negative   Aorta is not palpable.  Bilateral radial pulses  are palpable at 2+.  LE Pulses  LEFT  RIGHT   POPLITEAL  not palpable  not palpable   POSTERIOR TIBIAL  palpable  palpable   DORSALIS PEDIS  ANTERIOR TIBIAL  not palpable  not palpable   Gastrointestinal: soft, nontender, BS WNL, no r/g, negative  masses.  Musculoskeletal: Negative muscle atrophy/wasting. M/S 5/5 throughout, extremities without ischemic changes.  Neurologic: A&O X 3; Appropriate Affect ; SENSATION ;normal;  Speech is normal  CN 2-12 intact, Pain and light touch intact in extremities, Motor exam as listed above.    Non-Invasive Vascular Imaging CAROTID DUPLEX 12/20/2013   Widely patent bilateral CEA sites.  These findings are Unchanged from previous exam.   Assessment: CARTEZ MOGLE is a 73 y.o. male who presents with asymptomatic wdely patent bilateral CEA sites. The  ICA stenosis is  Unchanged from previous exam. He has mild claudication symptoms in his legs after walking a steep incline for 10 minutes, he declined ABI's today.  His major risk factor for cardiovascular disease remains tobacco use, smokes 1 PPD; he was again counseled re smoking cessation and given printed information re this.  Plan: Follow-up in 1 year with Carotid Duplex scan.  I advised patient to make an appointment with his pulmonologist since he has been more dyspneic in the last few weeks, wheezing is about the same today as a year ago.  I discussed in depth with the patient the nature of atherosclerosis, and emphasized the importance of maximal medical management including strict control of blood pressure, blood glucose, and lipid levels, obtaining regular exercise, and cessation of smoking.  The patient is aware that without maximal medical management the underlying atherosclerotic disease process will progress, limiting the benefit of any interventions. The patient was given information about stroke prevention and what symptoms should prompt the patient to seek immediate medical  care. Thank you for allowing [place] to participate in this patient's care.  Clemon Chambers, RN, MSN, FNP-C [facility] Office: [phone]  Clinic Physician: Oneida Alar  12/20/2013 10:19 AM

## 2013-12-20 NOTE — Addendum Note (Signed)
Addended by: Dorthula Rue L on: 12/20/2013 04:28 PM   Modules accepted: Orders

## 2014-01-07 ENCOUNTER — Ambulatory Visit: Payer: Self-pay | Admitting: Ophthalmology

## 2014-01-29 ENCOUNTER — Other Ambulatory Visit: Payer: Self-pay | Admitting: Emergency Medicine

## 2014-02-11 ENCOUNTER — Telehealth: Payer: Self-pay

## 2014-02-11 NOTE — Telephone Encounter (Signed)
Called and spoke with pt to advise that Annual Medicare Exam needed for 2015. Patient scheduled appointment for 03/11/14 with PCP and will call back if change is needed.

## 2014-02-25 ENCOUNTER — Other Ambulatory Visit: Payer: Self-pay | Admitting: Emergency Medicine

## 2014-02-26 ENCOUNTER — Other Ambulatory Visit: Payer: Self-pay | Admitting: Emergency Medicine

## 2014-03-04 ENCOUNTER — Other Ambulatory Visit (INDEPENDENT_AMBULATORY_CARE_PROVIDER_SITE_OTHER): Payer: Medicare Other | Admitting: *Deleted

## 2014-03-04 DIAGNOSIS — E785 Hyperlipidemia, unspecified: Secondary | ICD-10-CM

## 2014-03-04 LAB — HEPATIC FUNCTION PANEL
ALBUMIN: 4.2 g/dL (ref 3.5–5.2)
ALT: 10 U/L (ref 0–53)
AST: 13 U/L (ref 0–37)
Alkaline Phosphatase: 39 U/L (ref 39–117)
Bilirubin, Direct: 0 mg/dL (ref 0.0–0.3)
TOTAL PROTEIN: 7 g/dL (ref 6.0–8.3)
Total Bilirubin: 0.5 mg/dL (ref 0.2–1.2)

## 2014-03-04 LAB — LIPID PANEL
Cholesterol: 161 mg/dL (ref 0–200)
HDL: 47.8 mg/dL (ref >39.00)
LDL Cholesterol: 99 mg/dL (ref 0–99)
NonHDL: 113.2
TRIGLYCERIDES: 72 mg/dL (ref 0.0–149.0)
Total CHOL/HDL Ratio: 3
VLDL: 14.4 mg/dL (ref 0.0–40.0)

## 2014-03-05 ENCOUNTER — Other Ambulatory Visit: Payer: Self-pay | Admitting: Family Medicine

## 2014-03-05 ENCOUNTER — Telehealth: Payer: Self-pay | Admitting: Cardiovascular Disease

## 2014-03-05 NOTE — Telephone Encounter (Signed)
New message      Returning Pat's call from yesterday to get test results

## 2014-03-05 NOTE — Telephone Encounter (Signed)
Notified of lab results. 

## 2014-03-11 ENCOUNTER — Encounter: Payer: Medicare Other | Admitting: Family Medicine

## 2014-03-12 ENCOUNTER — Other Ambulatory Visit: Payer: Self-pay | Admitting: Cardiovascular Disease

## 2014-04-01 ENCOUNTER — Other Ambulatory Visit: Payer: Self-pay | Admitting: Family Medicine

## 2014-04-28 ENCOUNTER — Other Ambulatory Visit: Payer: Self-pay | Admitting: Family Medicine

## 2014-04-29 ENCOUNTER — Other Ambulatory Visit: Payer: Self-pay | Admitting: Emergency Medicine

## 2014-04-29 NOTE — Telephone Encounter (Signed)
Left voicemail for patient to call back. Patient need to make a f/u before refill can be made.

## 2014-04-30 ENCOUNTER — Telehealth: Payer: Self-pay | Admitting: Cardiovascular Disease

## 2014-04-30 NOTE — Telephone Encounter (Signed)
Pt states someone from this office called him about 20 minutes ago. Pt was made aware that according to his records no one has called from this office today. Pt states it shows that Pat Dr. Alyse Low nurse called him. Pt was made aware that Dr. Camillia Herter nurse is off today. Pt verbalized understanding.

## 2014-04-30 NOTE — Telephone Encounter (Signed)
Follow Up    Pt calling following up on call from earlier. Please call.

## 2014-05-01 ENCOUNTER — Encounter: Payer: Self-pay | Admitting: Cardiovascular Disease

## 2014-05-03 ENCOUNTER — Encounter: Payer: Self-pay | Admitting: Family Medicine

## 2014-05-03 ENCOUNTER — Ambulatory Visit (INDEPENDENT_AMBULATORY_CARE_PROVIDER_SITE_OTHER): Payer: Medicare Other | Admitting: Family Medicine

## 2014-05-03 VITALS — BP 135/80 | HR 74 | Temp 98.5°F | Ht 70.0 in | Wt 192.1 lb

## 2014-05-03 DIAGNOSIS — I10 Essential (primary) hypertension: Secondary | ICD-10-CM | POA: Diagnosis not present

## 2014-05-03 DIAGNOSIS — E7800 Pure hypercholesterolemia, unspecified: Secondary | ICD-10-CM

## 2014-05-03 DIAGNOSIS — J449 Chronic obstructive pulmonary disease, unspecified: Secondary | ICD-10-CM

## 2014-05-03 DIAGNOSIS — Z23 Encounter for immunization: Secondary | ICD-10-CM | POA: Diagnosis not present

## 2014-05-03 MED ORDER — FLUTICASONE-SALMETEROL 500-50 MCG/DOSE IN AEPB: INHALATION_SPRAY | RESPIRATORY_TRACT | Status: DC

## 2014-05-03 MED ORDER — IPRATROPIUM-ALBUTEROL 0.5-2.5 (3) MG/3ML IN SOLN: 3.0000 mL | RESPIRATORY_TRACT | Status: DC | PRN

## 2014-05-03 MED ORDER — NITROGLYCERIN 0.4 MG SL SUBL: 0.4000 mg | SUBLINGUAL_TABLET | SUBLINGUAL | Status: DC | PRN

## 2014-05-03 MED ORDER — ALBUTEROL SULFATE HFA 108 (90 BASE) MCG/ACT IN AERS: INHALATION_SPRAY | RESPIRATORY_TRACT | Status: DC

## 2014-05-03 NOTE — Progress Notes (Signed)
Subjective:    Patient ID: [name], male    DOB: June 24, 1940, 74 y.o.   MRN: [id number]  HPI Here for f/u of chronic health problems  Feels fair  Keeps working - enjoys it 39 hours per week   Wt is stable  bmi is 81  Copd is his worst problem on advair and spiriva and also ventolin rescue inhaler- this combo works well for him overall  Keeps duoneb for emergencies-does not use often  Last pulm appt - told they would notify for his f/u  Smokes 1/2 ppd- wants to eventually quit-not ready yet  Uses it "as a crutch" chantix did not agree with him   No recent heart or stroke issues  Has f/u tues with cardiology   He carries nitro stat in his pocket -- went through the dryer   bp is stable today (higher than goal) No cp or palpitations or headaches or edema  No side effects to medicines  BP Readings from Last 3 Encounters:  05/03/14 150/72  12/20/13 156/81  12/10/13 130/56    Better at home- claims [place]  Re check 135/80   Takes protonix and tums for GERD Bothers him to eat too late     Chemistry      Component Value Date/Time   NA 142 12/10/2013 1526   K 3.6 12/10/2013 1526   CL 103 12/10/2013 1526   CO2 30 12/10/2013 1526   BUN 17 12/10/2013 1526   CREATININE 1.1 12/10/2013 1526      Component Value Date/Time   CALCIUM 10.4 12/10/2013 1526   ALKPHOS 39 03/04/2014 0858   AST 13 03/04/2014 0858   ALT 10 03/04/2014 0858   BILITOT 0.5 03/04/2014 0858      Lab Results  Component Value Date   CHOL 161 03/04/2014   HDL 47.80 03/04/2014   [place] 99 03/04/2014   TRIG 72.0 03/04/2014   CHOLHDL 3 03/04/2014   cholesterol looks good   Patient Active Problem List   Diagnosis Date Noted  . Carotid stenosis 12/20/2013  . Aftercare following surgery of the circulatory system 12/20/2013  . Aftercare following surgery of the circulatory system, [place] 12/19/2012  . Diarrhea 08/28/2012  . Personal history of colonic polyps 08/28/2012  . Hyperglycemia  08/07/2012  . Boils 04/03/2012  . Intertrigo 04/03/2012  . Pre-operative cardiovascular examination 11/03/2011  . Carotid artery disease 05/20/2011  . Grief reaction 05/07/2011  . Ischemic cardiomyopathy 01/13/2011  . Palpitations 11/10/2010  . Occlusion and stenosis of carotid artery without mention of cerebral infarction 04/02/2010  . CHEST PAIN 04/02/2010  . ANXIETY 12/12/2009  . HYPERTENSION, BENIGN 09/29/2008  . PSA, INCREASED 09/19/2008  . CHRONIC OBSTRUCTIVE PULMONARY DISEASE, ACUTE EXACERBATION 06/19/2007  . HYPERLIPIDEMIA 02/10/2007  . AMAUROSIS FUGAX 02/10/2007  . MYOCARDIAL INFARCTION, HX OF 02/10/2007  . Coronary atherosclerosis 02/10/2007  . PERIPHERAL VASCULAR DISEASE 02/10/2007  . HEMORRHOIDS 02/10/2007  . EMPHYSEMA 02/10/2007  . Chronic obstructive pulmonary disease 02/10/2007  . OSTEOARTHRITIS 02/10/2007  . Nicotine dependence 02/10/2007  . PURE HYPERCHOLESTEROLEMIA 01/27/2007  . CORONARY ATHEROSCLEROSIS NATIVE CORONARY ARTERY 01/27/2007   Past Medical History  Diagnosis Date  . COPD (chronic obstructive pulmonary disease)   . CAD (coronary artery disease)     Ant MI 11  . HTN (hypertension)   . Hyperlipidemia   . Osteoarthritis   . Peripheral vascular disease   . Prostate cancer   . Carotid artery disease     Right CEA 2002 per Dr. [name]  .  Myocardial infarction   . GERD (gastroesophageal reflux disease)     occ  . CHF (congestive heart failure)     pt denies 09/25/12  . Anxiety   . PAC (premature atrial contraction)   . Pneumonia   . Hydradenitis   . Stroke 11-24-11   Past Surgical History  Procedure Laterality Date  . Coronary stent placement  97, 2000, 2012    8 stents  . Doppler echocardiography  1995    neg  . Colonoscopy  04-2001    polyp  . Cardiovascular stress test  12-2006  . Cardiac catheterization    . Vascular surgery    . Endarterectomy  11/24/2011    Procedure: ENDARTERECTOMY CAROTID;  Surgeon: Rosetta Posner, MD;  Location: [facility];  Service: Vascular;  Laterality: Left;  Marland Kitchen Eye surgery Left Aug. 17, 2015    Cataract  . Carotid endarterectomy Right 03-2000    CE  . Carotid endarterectomy Left 11-24-11    CE  . Prostate surgery      Radiation Tx  X's 40   History  Substance Use Topics  . Smoking status: Current Every Day Smoker -- 1.00 packs/day for 55 years    Types: Cigarettes  . Smokeless tobacco: Never Used  . Alcohol Use: Yes     Comment: occ   Family History  Problem Relation Age of Onset  . Stroke Father   . Heart disease Father   . Heart disease Mother     pacemaker  . Other Brother     benign brain tumor  . Cancer Brother     Eye-behind  . Liver cancer Brother   . Diabetes Maternal Grandmother   . Cancer Sister     Breast   Allergies  Allergen Reactions  . Augmentin [Amoxicillin-Pot Clavulanate] Nausea And Vomiting    Diarrhea  . Sulfonamide Derivatives   . Zantac [Ranitidine Hcl]     diarrhea   Current Outpatient Prescriptions on File Prior to Visit  Medication Sig Dispense Refill  . ALPRAZolam (XANAX) 0.5 MG tablet Take 1 tablet (0.5 mg total) by mouth at bedtime as needed. Sleep or anxiety 30 tablet 0  . aspirin 81 MG tablet Take 81 mg by mouth daily.     . clopidogrel (PLAVIX) 75 MG tablet Take 1 tablet (75 mg total) by mouth daily. 30 tablet 2  . loratadine (CLARITIN) 10 MG tablet Take 10 mg by mouth daily.    . metoprolol tartrate (LOPRESSOR) 25 MG tablet TAKE 1/2 TABLET TWICE A DAY 30 tablet 3  . pravastatin (PRAVACHOL) 40 MG tablet Take 1 tablet (40 mg total) by mouth daily. 30 tablet 11  . SPIRIVA HANDIHALER 18 MCG inhalation capsule INHALE 1 CAPSULE VIA HANDIHALER ONCE DAILY AT THE SAME TIME EVERY DAY 30 capsule 6  . Tamsulosin HCl (FLOMAX) 0.4 MG CAPS Take 0.4 mg by mouth daily.     . pantoprazole (PROTONIX) 40 MG tablet Take 40 mg by mouth daily as needed.     No current facility-administered medications on file prior to visit.    Review of Systems Review of Systems    Constitutional: Negative for fever, appetite change, fatigue and unexpected weight change.  Eyes: Negative for pain and visual disturbance.  Respiratory: Negative for cough and pos for shortness of breath.   Cardiovascular: Negative for cp or palpitations    Gastrointestinal: Negative for nausea, diarrhea and constipation.  Genitourinary: Negative for urgency and frequency.  Skin: Negative for pallor or  rash   Neurological: Negative for weakness, light-headedness, numbness and headaches.  Hematological: Negative for adenopathy. Does not bruise/bleed easily.  Psychiatric/Behavioral: Negative for dysphoric mood. The patient is not nervous/anxious.         Objective:   Physical Exam  Constitutional: He appears well-developed and well-nourished. No distress.  HENT:  Head: Normocephalic and atraumatic.  Mouth/Throat: Oropharynx is clear and moist.  Eyes: Conjunctivae and EOM are normal. Pupils are equal, round, and reactive to light. No scleral icterus.  Neck: Normal range of motion. Neck supple. No JVD present. Carotid bruit is present. No thyromegaly present.  Cardiovascular: Normal rate, regular rhythm, normal heart sounds and intact distal pulses.  Exam reveals no gallop.   Pulmonary/Chest: Effort normal and breath sounds normal. No respiratory distress. He has no wheezes. He exhibits no tenderness.  Diffusely distant bs   Abdominal: Soft. Bowel sounds are normal. He exhibits no distension, no abdominal bruit and no mass. There is no tenderness.  Musculoskeletal: He exhibits no edema.  Lymphadenopathy:    He has no cervical adenopathy.  Neurological: He is alert. He has normal reflexes. No cranial nerve deficit. He exhibits normal muscle tone. Coordination normal.  Skin: Skin is warm and dry. No rash noted. No erythema. No pallor.  Psychiatric: He has a normal mood and affect.          Assessment & Plan:   Problem List Items Addressed This Visit      Cardiovascular and  Mediastinum   HYPERTENSION, BENIGN - Primary    bp in fair control at this time  BP Readings from Last 1 Encounters:  05/03/14 135/80   No changes needed Disc lifstyle change with low sodium diet and exercise  Smoking cessation encouraged  No change in tx       Relevant Medications   nitroGLYCERIN (NITROSTAT) SL tablet     Respiratory   Chronic obstructive pulmonary disease    Continues advair and spiriva and albuterol rescue inhaler duoneb for prn use at home - refilled that -has not needed lately Disc smoking cessation- he is not ready yet       Relevant Medications   albuterol (VENTOLIN HFA) 108 (90 BASE) MCG/ACT inhaler   Fluticasone-Salmeterol (ADVAIR DISKUS) 500-50 MCG/DOSE AEPB   ipratropium-albuterol (DUONEB) 0.5-2.5 (3) MG/3ML SOLN     Other   PURE HYPERCHOLESTEROLEMIA    Disc goals for lipids and reasons to control them Rev labs with pt Rev low sat fat diet in detail Fair control with pravastatin and diet       Relevant Medications   nitroGLYCERIN (NITROSTAT) SL tablet    Other Visit Diagnoses    Need for pneumococcal vaccination        Relevant Orders    Pneumococcal conjugate vaccine 13-valent IM (Completed)

## 2014-05-03 NOTE — Progress Notes (Signed)
Pre visit review using our clinic review tool, if applicable. No additional management support is needed unless otherwise documented below in the visit note. 

## 2014-05-03 NOTE — Patient Instructions (Signed)
Make your follow up appt. with pulmonary - you are due in mid March for a yearly follow up  bp is better on the 2nd check  Continue current medicines  Try to eat a low fat and low sugar  Follow up in 6 months for annual exam with labs prior  prevnar vaccine today

## 2014-05-05 NOTE — Assessment & Plan Note (Signed)
Continues advair and spiriva and albuterol rescue inhaler duoneb for prn use at home - refilled that -has not needed lately Disc smoking cessation- he is not ready yet

## 2014-05-05 NOTE — Assessment & Plan Note (Signed)
Per pt stable  Refilled nitrostat to have on hand Controlling cholesterol  Enc to quit smoking

## 2014-05-05 NOTE — Assessment & Plan Note (Signed)
bp in fair control at this time  BP Readings from Last 1 Encounters:  05/03/14 135/80   No changes needed Disc lifstyle change with low sodium diet and exercise  Smoking cessation encouraged  No change in tx

## 2014-05-05 NOTE — Assessment & Plan Note (Signed)
Disc goals for lipids and reasons to control them Rev labs with pt Rev low sat fat diet in detail Fair control with pravastatin and diet

## 2014-05-07 ENCOUNTER — Encounter: Payer: Self-pay | Admitting: Cardiovascular Disease

## 2014-05-07 ENCOUNTER — Ambulatory Visit (INDEPENDENT_AMBULATORY_CARE_PROVIDER_SITE_OTHER): Payer: Medicare Other | Admitting: Cardiovascular Disease

## 2014-05-07 VITALS — BP 126/64 | HR 82 | Ht 70.0 in | Wt 196.0 lb

## 2014-05-07 DIAGNOSIS — Z720 Tobacco use: Secondary | ICD-10-CM

## 2014-05-07 DIAGNOSIS — I779 Disorder of arteries and arterioles, unspecified: Secondary | ICD-10-CM

## 2014-05-07 DIAGNOSIS — I2510 Atherosclerotic heart disease of native coronary artery without angina pectoris: Secondary | ICD-10-CM | POA: Diagnosis not present

## 2014-05-07 DIAGNOSIS — E785 Hyperlipidemia, unspecified: Secondary | ICD-10-CM

## 2014-05-07 DIAGNOSIS — I739 Peripheral vascular disease, unspecified: Principal | ICD-10-CM

## 2014-05-07 MED ORDER — METOPROLOL TARTRATE 25 MG PO TABS: 12.5000 mg | ORAL_TABLET | Freq: Two times a day (BID) | ORAL | Status: DC

## 2014-05-07 NOTE — Patient Instructions (Signed)
Your physician wants you to follow-up in:  12 months.  You will receive a reminder letter in the mail two months in advance. If you don't receive a letter, please call our office to schedule the follow-up appointment.   

## 2014-05-07 NOTE — Progress Notes (Signed)
History of Present Illness: 74 yo male with history of CAD, COPD, HTN, HLD, GERD, PAD, carotid artery disease who is here today for cardiac follow up. His cardiac history is significant for anterior MI in 1997 tx'd with bare metal stent x 2 to the LAD, bare metal stenting to the CFX in 2002 and PCI in 2009 with placement of 2 separate drug eluting stents in the LAD.  Cardiac cath on 04/14/10 with severe stenosis in the distal AV groove Circumflex and the OM. These were both treated with DES. His carotid artery dopplers 12/02/12 in [facility] office showed mild disease in RICA at CEA site and mild disease in the LICA CES site. He is followed by Dr. Lamonte Sakai for COPD. He stopped Losartan 2014 due to dizziness and hypotension. He had c/o chest pain at his visit here in March 2015. I arranged a stress test but he cancelled this test. He was seen in follow up 12/10/13 and his chest pain had resolved.   He is here for follow up. He denies chest pain or change in breathing. Still smoking. Describes constant fatigue. No chills, fever, rigors. Chronic cough. He continues to smoke 1/2 ppd. He walks at work. He walks his dog at night.   Primary Care Physician: Lindley Magnus Tower   Last Lipid Profile:Lipid Panel     Component Value Date/Time   CHOL 161 03/04/2014 0858   TRIG 72.0 03/04/2014 0858   HDL 47.80 03/04/2014 0858   CHOLHDL 3 03/04/2014 0858   VLDL 14.4 03/04/2014 0858   LDLCALC 99 03/04/2014 0858    Past Medical History  Diagnosis Date  . COPD (chronic obstructive pulmonary disease)   . CAD (coronary artery disease)     Ant MI 58  . HTN (hypertension)   . Hyperlipidemia   . Osteoarthritis   . Peripheral vascular disease   . Prostate cancer   . Carotid artery disease     Right CEA 2002 per Dr. [name]  . Myocardial infarction   . GERD (gastroesophageal reflux disease)     occ  . CHF (congestive heart failure)     pt denies 09/25/12  . Anxiety   . PAC (premature atrial contraction)   . Pneumonia    . Hydradenitis   . Stroke 11-24-11    Past Surgical History  Procedure Laterality Date  . Coronary stent placement  97, 2000, 2012    8 stents  . Doppler echocardiography  1995    neg  . Colonoscopy  04-2001    polyp  . Cardiovascular stress test  12-2006  . Cardiac catheterization    . Vascular surgery    . Endarterectomy  11/24/2011    Procedure: ENDARTERECTOMY CAROTID;  Surgeon: Rosetta Posner, MD;  Location: [facility];  Service: Vascular;  Laterality: Left;  Marland Kitchen Eye surgery Left Aug. 17, 2015    Cataract  . Carotid endarterectomy Right 03-2000    CE  . Carotid endarterectomy Left 11-24-11    CE  . Prostate surgery      Radiation Tx  X's 40    Current Outpatient Prescriptions  Medication Sig Dispense Refill  . albuterol (VENTOLIN HFA) 108 (90 BASE) MCG/ACT inhaler INHALE 2 PUFFS INTO THE LUNGS EVERY 4 (FOUR) HOURS AS NEEDED FOR WHEEZING OR SHORTNESS OF BREATH. 1 Inhaler 11  . ALPRAZolam (XANAX) 0.5 MG tablet Take 1 tablet (0.5 mg total) by mouth at bedtime as needed. Sleep or anxiety 30 tablet 0  . aspirin 81 MG  tablet Take 81 mg by mouth daily.     . clopidogrel (PLAVIX) 75 MG tablet Take 1 tablet (75 mg total) by mouth daily. 30 tablet 2  . Fluticasone-Salmeterol (ADVAIR DISKUS) 500-50 MCG/DOSE AEPB INHALE 1 PUFF INTO THE LUNGS EVERY 12 (TWELVE) HOURS. 60 each 11  . ipratropium-albuterol (DUONEB) 0.5-2.5 (3) MG/3ML SOLN Take 3 mLs by nebulization every 4 (four) hours as needed. 90 mL 2  . loratadine (CLARITIN) 10 MG tablet Take 10 mg by mouth daily.    . metoprolol tartrate (LOPRESSOR) 25 MG tablet TAKE 1/2 TABLET TWICE A DAY 30 tablet 3  . nitroGLYCERIN (NITROSTAT) 0.4 MG SL tablet Place 1 tablet (0.4 mg total) under the tongue every 5 (five) minutes as needed. For chest pain 10 tablet 1  . pantoprazole (PROTONIX) 40 MG tablet Take 40 mg by mouth daily as needed.    . pravastatin (PRAVACHOL) 40 MG tablet Take 1 tablet (40 mg total) by mouth daily. 30 tablet 11  . SPIRIVA HANDIHALER  18 MCG inhalation capsule INHALE 1 CAPSULE VIA HANDIHALER ONCE DAILY AT THE SAME TIME EVERY DAY 30 capsule 6  . Tamsulosin HCl (FLOMAX) 0.4 MG CAPS Take 0.4 mg by mouth daily.      No current facility-administered medications for this visit.    Allergies  Allergen Reactions  . Augmentin [Amoxicillin-Pot Clavulanate] Nausea And Vomiting    Diarrhea  . Sulfonamide Derivatives   . Zantac [Ranitidine Hcl]     diarrhea    History   Social History  . Marital Status: Widowed    Spouse Name: N/A  . Number of Children: 5  . Years of Education: N/A   Occupational History  . firefighter   .     Social History Main Topics  . Smoking status: Current Every Day Smoker -- 1.00 packs/day for 55 years    Types: Cigarettes  . Smokeless tobacco: Never Used  . Alcohol Use: Yes     Comment: occ  . Drug Use: No  . Sexual Activity: Not on file   Other Topics Concern  . Not on file   Social History Narrative    Family History  Problem Relation Age of Onset  . Stroke Father   . Heart disease Father   . Heart disease Mother     pacemaker  . Other Brother     benign brain tumor  . Cancer Brother     Eye-behind  . Liver cancer Brother   . Diabetes Maternal Grandmother   . Cancer Sister     Breast    Review of Systems:  As stated in the HPI and otherwise negative.   BP 126/64 mmHg  Pulse 82  Ht 5\' 10"  (1.778 m)  Wt 196 lb (88.905 kg)  BMI 28.12 kg/m2  Physical Examination: General: Well developed, well nourished, NAD HEENT: OP clear, mucus membranes moist SKIN: warm, dry. No rashes. Neuro: No focal deficits Musculoskeletal: Muscle strength 5/5 all ext Psychiatric: Mood and affect normal Neck: No JVD, no carotid bruits, no thyromegaly, no lymphadenopathy. Lungs:Clear bilaterally, no wheezes, rhonci, crackles Cardiovascular: Regular rate and rhythm. No murmurs, gallops or rubs. Abdomen:Soft. Bowel sounds present. Non-tender.  Extremities: No lower extremity edema. Pulses  are 2 + in the bilateral DP/PT.  Assessment and Plan:   1. CAD:  Stable. No chest pain. He is on good medical therapy. Will continue dual anti-platelet therapy with ASA and Plavix since has multiple stents.  Continue statin.     2. Carotid  artery disease: Stable s/p bilateral CEA. Followed by Dr. [name] in [facility].   3. Tobacco abuse: Smoking cessation encouraged. He does not wish to stop smoking.   4. Hyperlipidemia: Continue statin. Lipids as above, LDL is below 100. He does not tolerate high doses of statins but is tolerating Pravastatin.

## 2014-05-28 ENCOUNTER — Other Ambulatory Visit: Payer: Self-pay | Admitting: Cardiovascular Disease

## 2014-05-29 ENCOUNTER — Ambulatory Visit: Payer: Medicare Other | Admitting: Cardiovascular Disease

## 2014-05-31 ENCOUNTER — Ambulatory Visit (INDEPENDENT_AMBULATORY_CARE_PROVIDER_SITE_OTHER): Payer: Medicare Other | Admitting: Adult Health

## 2014-05-31 ENCOUNTER — Telehealth: Payer: Self-pay | Admitting: Emergency Medicine

## 2014-05-31 ENCOUNTER — Encounter: Payer: Self-pay | Admitting: Adult Health

## 2014-05-31 ENCOUNTER — Ambulatory Visit (INDEPENDENT_AMBULATORY_CARE_PROVIDER_SITE_OTHER)
Admission: RE | Admit: 2014-05-31 | Discharge: 2014-05-31 | Disposition: A | Discharge status: Home / Self Care | Payer: Medicare Other | Source: Ambulatory Visit | Attending: Adult Health | Admitting: Adult Health

## 2014-05-31 VITALS — BP 122/80 | HR 84 | Ht 70.0 in | Wt 193.8 lb

## 2014-05-31 DIAGNOSIS — J441 Chronic obstructive pulmonary disease with (acute) exacerbation: Secondary | ICD-10-CM

## 2014-05-31 DIAGNOSIS — J449 Chronic obstructive pulmonary disease, unspecified: Secondary | ICD-10-CM | POA: Diagnosis not present

## 2014-05-31 DIAGNOSIS — R05 Cough: Secondary | ICD-10-CM | POA: Diagnosis not present

## 2014-05-31 MED ORDER — LEVOFLOXACIN 500 MG PO TABS: 500.0000 mg | ORAL_TABLET | Freq: Every day | ORAL | Status: AC

## 2014-05-31 MED ORDER — PREDNISONE 10 MG PO TABS: ORAL_TABLET | ORAL | Status: DC

## 2014-05-31 NOTE — Telephone Encounter (Signed)
We haven't seen him in a year so not able to treat over the phone - either see [place] or primary or UC asap

## 2014-05-31 NOTE — Telephone Encounter (Signed)
Called spoke with pt. appt scheduled to see TP today. Nothing further needed

## 2014-05-31 NOTE — Telephone Encounter (Signed)
Spoke with pt, c/o worsening sob with exertion, sometimes prod cough with pale green mucus X2 weeks.  Denies fever, sinus congestion.  States normally when he has an episode like this RB gives him a pred taper and abx.   Uses [facility] on [address] in [place].  MW please advise in RB's absence.  Thanks!  Allergies  Allergen Reactions  . Augmentin [Amoxicillin-Pot Clavulanate] Nausea And Vomiting    Diarrhea  . Sulfonamide Derivatives   . Zantac [Ranitidine Hcl]     diarrhea

## 2014-05-31 NOTE — Patient Instructions (Signed)
Levaquin 500mg  daily for 7 days  Mucinex DM Twice daily  As needed  Cough/congestion  Prednisone taper over next week.  Please contact office for sooner follow up if symptoms do not improve or worsen or seek emergency care  Follow up Dr. Lamonte Sakai  In 2-3 months and As needed

## 2014-06-05 DIAGNOSIS — J441 Chronic obstructive pulmonary disease with (acute) exacerbation: Secondary | ICD-10-CM | POA: Diagnosis not present

## 2014-06-05 MED ORDER — LEVALBUTEROL HCL 0.63 MG/3ML IN NEBU
0.6300 mg | INHALATION_SOLUTION | Freq: Once | RESPIRATORY_TRACT | Status: AC
  Administered 2014-06-05: 0.63 mg via RESPIRATORY_TRACT

## 2014-06-07 NOTE — Progress Notes (Signed)
Subjective:  Patient ID: [name], male    DOB: 1940/10/20, 74 y.o.   MRN: [id number] HPI 74 yo smoker (~100pk-yrs), hx of CAD/PTCI, HTN,  prostate CA. Dx with COPD around 10 yrs ago. Last seen 2007 by PW. Maintanance was Advair bid. Previously on Spiriva, stopped in 2008.   ROV 12/29/10 -- Severe COPD, CAD (PTCI by Dr [name]). Walking oximetry last visit showed desats but he didn't want to start O2. Tells me that he was seen by Dr [name] last week, exertional SOB, CP, weakness. He underwent L cath with PTCI. He reports continued DOE, but CP has resolved, weakness is better. Last time we did experiment changing Advair to Symbicort - he believes that it is at least as good, not necessarily better. Still on Spiriva. He has not restarted quinipril. We treated him prednisone after his last visit here, he says it helped his stamina, may have made breathing better. Smoking 5-10 cig a day.   ROV 03/08/11 -- Severe COPD, documented hypoxemia but has not wanted O2. Had been well until developed URI about 2 weeks ago. Has had progressive SOB, wheeze, cough. Prod of green/brown, sometimes clear. He stopped smoking about 1 weeks ago.  Remains on Spiriva, went back to Advair.   ROV 04/08/11 -- Severe COPD, documented hypoxemia but has not wanted O2. Reports that he has restarted smoking - about 1/3 of a pack daily. Did not tolerate Chantix. He is improved after rx with doxy and pred 1 months ago. Some cough, less freq - clear sputum. Remains on Spiriva and Advair 500/50.   ROV 07/14/11 -- Severe COPD, documented hypoxemia but has not wanted O2. Remains on Spiriva and Advair 500/50. DuoNebs prn, hasn't needed since last visit. Has been using albuterol HFA about once a day. Still smoking. Unfortunately his wife died 29-Apr-2022, quitting tobacco hasn't been possible yet. He wants to quit again, but not ready to set quit date. He is still working.   ROV 10/26/11 -- Hx CAD, Severe COPD, documented hypoxemia but has not  wanted O2. Remains on Spiriva and Advair 500/50. DuoNebs or albuterol prn. Still smoking 3/4 pk a day. Remains on Spiriva + Advair. No AE since last visit. Able to work part-time, has difficulty w yard work.  Planning for L carotid sgy with Dr [name]. Some increased cough last 3 -4 days, more mucous. No change in dyspnea or wheeze.   ROV 03/17/12 -- Hx CAD, Severe COPD, documented hypoxemia but has not wanted O2. Remains on Spiriva and Advair 500/50. DuoNebs or albuterol prn >> uses it 1 -2 x a day. 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 Has had some dry cough last few days.  He is starting the e-cig, hasn't had a cigarette in 2-3 days.   ROV 07/17/12 -- Hx CAD, Severe COPD, documented hypoxemia (not on O2). He has not smoked since last time - has instead been using e-cig. Discussed the pro's/con's of this. No flares, no bronchitis. He has some exertional wheeze. His cough has increased with the allergy season. He is on loratadine.   ROV 11/17/12 -- Hx CAD, Severe COPD, documented hypoxemia (not on O2). He restarted smoking about 2 months ago. He notes that the e cig irritated him and made cough. He is smoking 1/2 pk a day. Cough has been a bit more x 2 weeks. Productive of clear mucous, worst in the am. Still on Advair + Spiriva.  Uses albuterol 2-3x a day. Remains on  loratadine. He had tried chantix before > had side effects. Breathing is stable, some exertional SOB.   03/09/13 -- Hx CAD, Severe COPD, documented hypoxemia (not on O2).  He has had an increased cough for about 2-3 weeks.  He is on Spiriva and Advair, needed albuterol a few times which is unusual.   ROV 04/06/13 -- severe COPD, seen for an AE in mid December. He got better on pred, then a little more cough. Still smoking. Returns today describing cough and mucous, not as bad as the exacerbation.    ROV 06/04/13 -- severe COPD, also CAD. He reports today that he has had some vertigo that feels like spinning. Happens  when he changes position. He has had this before several yrs ago. He is on advair + spiriva. He has had some episodes of dyspnea, had to use his neb x 2 last week. Otherwise doing well. He is still smoking 0.5 pk a day.   05/31/14 Acute OV : severe COPD  Presents for an acute office visit.  Complains of productive cough with pale green mucus, increased SOB with wheezing and chest tightness x 2 weeks.  Denies f/c/s, n/v, hemoptysis and edema  No recnet abx . No ER visit.  No otc used.    CAT Score 03/17/2012  Total CAT Score 10    Objective:  Physical Exam Filed Vitals:   05/31/14 1558  BP: 122/80  Pulse: 84   Gen: Pleasant, obese man, in no distress,  normal affect  ENT: No lesions,  mouth clear,  oropharynx clear, no postnasal drip  Neck: No JVD, no TMG, no carotid bruits  Lungs: No use of accessory muscles, no dullness to percussion, Coarse BS w/ few rhonchi, few scattered wheezes  Cardiovascular: RRR, heart sounds normal, no murmur or gallops, no peripheral edema  Musculoskeletal: No deformities, no cyanosis or clubbing  Neuro: alert, non focal  Skin: Warm, no lesions or rashes   Assessment & Plan:  CHRONIC OBSTRUCTIVE PULMONARY DISEASE, ACUTE EXACERBATION Levaquin 500mg  daily for 7 days  Mucinex DM Twice daily  As needed  Cough/congestion  Prednisone taper over next week.  Please contact office for sooner follow up if symptoms do not improve or worsen or seek emergency care  Follow up Dr. Lamonte Sakai  In 2-3 months and As needed

## 2014-06-07 NOTE — Assessment & Plan Note (Signed)
Levaquin 500mg  daily for 7 days  Mucinex DM Twice daily  As needed  Cough/congestion  Prednisone taper over next week.  Please contact office for sooner follow up if symptoms do not improve or worsen or seek emergency care  Follow up Dr. Lamonte Sakai  In 2-3 months and As needed

## 2014-06-10 ENCOUNTER — Telehealth: Payer: Self-pay | Admitting: Adult Health

## 2014-06-10 NOTE — Progress Notes (Signed)
Quick Note:  Pt returned call per 3.21.16 phone note Appt scheduled with TP for 4.8.16 w/ cxr prior Pt to call the office if symptoms do not continue to improve or they worsen ______

## 2014-06-10 NOTE — Progress Notes (Signed)
Quick Note:  Called spoke with patient, advised needs ov sooner than his 07/18/14 with RB. Would schedule pt to be seen the week of 4/4 with TP. Unfortunately pt was not at home and without a calendar - he will call back and ask to speak with me. ______

## 2014-06-10 NOTE — Telephone Encounter (Signed)
Spoke with patient Appt scheduled to see TP on 4.8.16 (date okay per TP) for repeat cxr  Pt aware to call the office if symptoms do not continue to improve or they worsen Nothing further needed; will sign off.

## 2014-06-28 ENCOUNTER — Ambulatory Visit (INDEPENDENT_AMBULATORY_CARE_PROVIDER_SITE_OTHER)
Admission: RE | Admit: 2014-06-28 | Discharge: 2014-06-28 | Disposition: A | Discharge status: Home / Self Care | Payer: Medicare Other | Source: Ambulatory Visit | Attending: Adult Health | Admitting: Adult Health

## 2014-06-28 ENCOUNTER — Ambulatory Visit (INDEPENDENT_AMBULATORY_CARE_PROVIDER_SITE_OTHER): Payer: Medicare Other | Admitting: Adult Health

## 2014-06-28 ENCOUNTER — Other Ambulatory Visit (INDEPENDENT_AMBULATORY_CARE_PROVIDER_SITE_OTHER): Payer: Medicare Other

## 2014-06-28 ENCOUNTER — Encounter: Payer: Self-pay | Admitting: Adult Health

## 2014-06-28 VITALS — BP 144/82 | HR 77 | Temp 97.8°F | Ht 70.0 in | Wt 195.6 lb

## 2014-06-28 DIAGNOSIS — J189 Pneumonia, unspecified organism: Secondary | ICD-10-CM

## 2014-06-28 DIAGNOSIS — J449 Chronic obstructive pulmonary disease, unspecified: Secondary | ICD-10-CM | POA: Diagnosis not present

## 2014-06-28 DIAGNOSIS — R911 Solitary pulmonary nodule: Secondary | ICD-10-CM | POA: Diagnosis not present

## 2014-06-28 DIAGNOSIS — J9811 Atelectasis: Secondary | ICD-10-CM | POA: Diagnosis not present

## 2014-06-28 LAB — BASIC METABOLIC PANEL
BUN: 19 mg/dL (ref 6–23)
CHLORIDE: 104 meq/L (ref 96–112)
CO2: 30 mEq/L (ref 19–32)
Calcium: 9.5 mg/dL (ref 8.4–10.5)
Creatinine, Ser: 1.2 mg/dL (ref 0.40–1.50)
GFR: 62.9 mL/min (ref >60.00)
Glucose, Bld: 100 mg/dL — ABNORMAL HIGH (ref 70–99)
POTASSIUM: 3.9 meq/L (ref 3.5–5.1)
SODIUM: 138 meq/L (ref 135–145)

## 2014-06-28 MED ORDER — TIOTROPIUM BROMIDE MONOHYDRATE 18 MCG IN CAPS: 18.0000 ug | ORAL_CAPSULE | Freq: Every day | RESPIRATORY_TRACT | Status: DC

## 2014-06-28 MED ORDER — FLUTICASONE-SALMETEROL 500-50 MCG/DOSE IN AEPB: 1.0000 | INHALATION_SPRAY | Freq: Two times a day (BID) | RESPIRATORY_TRACT | Status: DC

## 2014-06-28 NOTE — Progress Notes (Signed)
Subjective:  Patient ID: [name], male    DOB: Jun 27, 1940, 74 y.o.   MRN: [id number] HPI 74 yo smoker (~100pk-yrs), hx of CAD/PTCI, HTN,  prostate CA. Dx with COPD around 10 yrs ago. Last seen 2007 by PW. Maintanance was Advair bid. Previously on Spiriva, stopped in 2008.   ROV 12/29/10 -- Severe COPD, CAD (PTCI by Dr [name]). Walking oximetry last visit showed desats but he didn't want to start O2. Tells me that he was seen by Dr [name] last week, exertional SOB, CP, weakness. He underwent L cath with PTCI. He reports continued DOE, but CP has resolved, weakness is better. Last time we did experiment changing Advair to Symbicort - he believes that it is at least as good, not necessarily better. Still on Spiriva. He has not restarted quinipril. We treated him prednisone after his last visit here, he says it helped his stamina, may have made breathing better. Smoking 5-10 cig a day.   ROV 03/08/11 -- Severe COPD, documented hypoxemia but has not wanted O2. Had been well until developed URI about 2 weeks ago. Has had progressive SOB, wheeze, cough. Prod of green/brown, sometimes clear. He stopped smoking about 1 weeks ago.  Remains on Spiriva, went back to Advair.   ROV 04/08/11 -- Severe COPD, documented hypoxemia but has not wanted O2. Reports that he has restarted smoking - about 1/3 of a pack daily. Did not tolerate Chantix. He is improved after rx with doxy and pred 1 months ago. Some cough, less freq - clear sputum. Remains on Spiriva and Advair 500/50.   ROV 07/14/11 -- Severe COPD, documented hypoxemia but has not wanted O2. Remains on Spiriva and Advair 500/50. DuoNebs prn, hasn't needed since last visit. Has been using albuterol HFA about once a day. Still smoking. Unfortunately his wife died 05-03-22, quitting tobacco hasn't been possible yet. He wants to quit again, but not ready to set quit date. He is still working.   ROV 10/26/11 -- Hx CAD, Severe COPD, documented hypoxemia but has not  wanted O2. Remains on Spiriva and Advair 500/50. DuoNebs or albuterol prn. Still smoking 3/4 pk a day. Remains on Spiriva + Advair. No AE since last visit. Able to work part-time, has difficulty w yard work.  Planning for L carotid sgy with Dr [name]. Some increased cough last 3 -4 days, more mucous. No change in dyspnea or wheeze.   ROV 03/17/12 -- Hx CAD, Severe COPD, documented hypoxemia but has not wanted O2. Remains on Spiriva and Advair 500/50. DuoNebs or albuterol prn >> uses it 1 -2 x a day. 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 Has had some dry cough last few days.  He is starting the e-cig, hasn't had a cigarette in 2-3 days.   ROV 07/17/12 -- Hx CAD, Severe COPD, documented hypoxemia (not on O2). He has not smoked since last time - has instead been using e-cig. Discussed the pro's/con's of this. No flares, no bronchitis. He has some exertional wheeze. His cough has increased with the allergy season. He is on loratadine.   ROV 11/17/12 -- Hx CAD, Severe COPD, documented hypoxemia (not on O2). He restarted smoking about 2 months ago. He notes that the e cig irritated him and made cough. He is smoking 1/2 pk a day. Cough has been a bit more x 2 weeks. Productive of clear mucous, worst in the am. Still on Advair + Spiriva.  Uses albuterol 2-3x a day. Remains on  loratadine. He had tried chantix before > had side effects. Breathing is stable, some exertional SOB.   03/09/13 -- Hx CAD, Severe COPD, documented hypoxemia (not on O2).  He has had an increased cough for about 2-3 weeks.  He is on Spiriva and Advair, needed albuterol a few times which is unusual.   ROV 04/06/13 -- severe COPD, seen for an AE in mid December. He got better on pred, then a little more cough. Still smoking. Returns today describing cough and mucous, not as bad as the exacerbation.    ROV 06/04/13 -- severe COPD, also CAD. He reports today that he has had some vertigo that feels like spinning. Happens  when he changes position. He has had this before several yrs ago. He is on advair + spiriva. He has had some episodes of dyspnea, had to use his neb x 2 last week. Otherwise doing well. He is still smoking 0.5 pk a day.   05/31/14 Acute OV : severe COPD  Presents for an acute office visit.  Complains of productive cough with pale green mucus, increased SOB with wheezing and chest tightness x 2 weeks.  Denies f/c/s, n/v, hemoptysis and edema  No recnet abx . No ER visit.  No otc used.  >>Levaquin and pred taper   06/28/2014 Follow up : Severe COPD Patient returns for a three-week follow-up. Patient was seen in the office 3 weeks ago for COPD exacerbation. He was treated with Levaquin and a prednisone taper. Patient is feeling much improved with decreased cough and wheezing. Has minimally productive with white mucus.  He does continue smoke. We discussed smoking cessation. Chest x-ray last visit. Head showed a right upper lobe nodule. Repeat chest x-ray today shows a persistent questionable pulmonary nodule in the right upper lobe. No weight loss or hemoptysis  He denies any chest pain, orthopnea, PND, or increased leg swelling  ROS  Constitutional:   No  weight loss, night sweats,  Fevers, chills, + fatigue, or  lassitude.  HEENT:   No headaches,  Difficulty swallowing,  Tooth/dental problems, or  Sore throat,                No sneezing, itching, ear ache, +nasal congestion, post nasal drip,   CV:  No chest pain,  Orthopnea, PND, swelling in lower extremities, anasarca, dizziness, palpitations, syncope.   GI  No heartburn, indigestion, abdominal pain, nausea, vomiting, diarrhea, change in bowel habits, loss of appetite, bloody stools.   Resp:    No chest wall deformity  Skin: no rash or lesions.  GU: no dysuria, change in color of urine, no urgency or frequency.  No flank pain, no hematuria   MS:  No joint pain or swelling.  No decreased range of motion.  No back pain.  Psych:   No change in mood or affect. No depression or anxiety.  No memory loss.      Objective:  Physical Exam  Gen: Pleasant, obese man, in no distress,  normal affect  ENT: No lesions,  mouth clear,  oropharynx clear, no postnasal drip  Neck: No JVD, no TMG, no carotid bruits  Lungs: No use of accessory muscles, no dullness to percussion, Decreased BS in bases , no wheezing   Cardiovascular: RRR, heart sounds normal, no murmur or gallops, no peripheral edema  Musculoskeletal: No deformities, no cyanosis or clubbing  Neuro: alert, non focal  Skin: Warm, no lesions or rashes   06/28/2014 CXR  As noted on prior study  of 05/31/2014, a questionable pulmonary nodule is noted in the right upper lobe. This is unchanged. Again nonenhanced chest CT suggested for further evaluation  Assessment & Plan:  No problem-specific assessment & plan notes found for this encounter.

## 2014-06-28 NOTE — Assessment & Plan Note (Signed)
Recent exacerbation- now resolving .  Smoking cessation discussed   Plan  Continue on Advair and Spiriva   Work on not smoking.  We are setting you up for a CT chest .  follow up Dr. Lamonte Sakai  As planned and As needed   Please contact office for sooner follow up if symptoms do not improve or worsen or seek emergency care

## 2014-06-28 NOTE — Addendum Note (Signed)
Addended by: Parke Poisson E on: 06/28/2014 03:55 PM   Modules accepted: Orders

## 2014-06-28 NOTE — Assessment & Plan Note (Signed)
RUL nodule on CXR in smoker  Needs CT chest  follow up Dr. Lamonte Sakai

## 2014-06-28 NOTE — Patient Instructions (Signed)
Continue on Advair and Spiriva   Work on not smoking.  We are setting you up for a CT chest .  follow up Dr. Lamonte Sakai  As planned and As needed   Please contact office for sooner follow up if symptoms do not improve or worsen or seek emergency care

## 2014-07-03 ENCOUNTER — Ambulatory Visit (INDEPENDENT_AMBULATORY_CARE_PROVIDER_SITE_OTHER)
Admission: RE | Admit: 2014-07-03 | Discharge: 2014-07-03 | Disposition: A | Discharge status: Home / Self Care | Payer: Medicare Other | Source: Ambulatory Visit | Attending: Adult Health | Admitting: Adult Health

## 2014-07-03 DIAGNOSIS — Z8546 Personal history of malignant neoplasm of prostate: Secondary | ICD-10-CM | POA: Diagnosis not present

## 2014-07-03 DIAGNOSIS — J439 Emphysema, unspecified: Secondary | ICD-10-CM | POA: Diagnosis not present

## 2014-07-03 DIAGNOSIS — J189 Pneumonia, unspecified organism: Secondary | ICD-10-CM

## 2014-07-03 DIAGNOSIS — R911 Solitary pulmonary nodule: Secondary | ICD-10-CM | POA: Diagnosis not present

## 2014-07-03 DIAGNOSIS — R918 Other nonspecific abnormal finding of lung field: Secondary | ICD-10-CM | POA: Diagnosis not present

## 2014-07-03 MED ORDER — IOHEXOL 300 MG/ML  SOLN: 80.0000 mL | Freq: Once | INTRAMUSCULAR | Status: AC | PRN

## 2014-07-10 ENCOUNTER — Other Ambulatory Visit: Payer: Self-pay | Admitting: Adult Health

## 2014-07-10 DIAGNOSIS — R911 Solitary pulmonary nodule: Secondary | ICD-10-CM

## 2014-07-10 NOTE — Progress Notes (Signed)
Notes Recorded by Melvenia Needles, NP on 07/09/2014 at 3:54 PM He needs pet done before next Thursday as ov with Dr. Lamonte Sakai Next week.  Also thyroid nodule needs to be faxed to PCP as he needs ov to see if thyroid US is needed.  He is aware and to make PCP .  Please contact office for sooner follow up if symptoms do not improve or worsen or seek emergency care   ------  Notes Recorded by Melvenia Needles, NP on 07/09/2014 at 3:49 PM Small lung nodule in RUL  Need PET scan -please set up and put on reminder list.

## 2014-07-10 NOTE — Progress Notes (Signed)
Quick Note:  Orders only encounter created for PET order PET has been scheduled for 4.27.16, 1 day prior to ov w/ RB ______

## 2014-07-13 NOTE — Op Note (Signed)
PATIENT NAME:  [name], [name] MR#:  [id number] DATE OF BIRTH:  07/15/1940  DATE OF PROCEDURE:  11/05/2013  PREOPERATIVE DIAGNOSIS: Cataract, left eye.   POSTOPERATIVE DIAGNOSIS: Cataract, left eye.   PROCEDURE PERFORMED: Extracapsular cataract extraction using phacoemulsification with placement of Alcon SN6CWS 20.0 diopter posterior chamber lens, serial number [id number].   SURGEON: Loura Back. Shetara Launer, MD   ANESTHESIA: 4% lidocaine and 0.75% Marcaine, a 50-50 mixture with 10 units/mL of Hylenex added, given as a peribulbar.  ANESTHESIOLOGIST: Gunnar Bulla, MD   COMPLICATIONS: None.   ESTIMATED BLOOD LOSS: Less than 1 mL.   DESCRIPTION OF PROCEDURE:  The patient was brought to the operating room and given a peribulbar block.  The patient was then prepped and draped in the usual fashion.  The vertical rectus muscles were imbricated using 5-0 silk sutures.  These sutures were then clamped to the sterile drapes as bridle sutures.  A limbal peritomy was performed extending two clock hours and hemostasis was obtained with cautery.  A partial thickness scleral groove was made at the surgical limbus and dissected anteriorly in a lamellar dissection using an Alcon crescent knife.  The anterior chamber was entered supero-temporally with a Superblade and through the lamellar dissection with a 2.6 mm keratome.  DisCoVisc was used to replace the aqueous and a continuous tear capsulorrhexis was carried out.  Hydrodissection and hydrodelineation were carried out with balanced salt and a 27 gauge canula.  The nucleus was rotated to confirm the effectiveness of the hydrodissection.  Phacoemulsification was carried out using a divide-and-conquer technique.  Total ultrasound time was 2 minutes and 32 seconds with an average power of  27.5% percent and CDE of 64.57.  No sutures placed.  Irrigation/aspiration was used to remove the residual cortex.  DisCoVisc was used to inflate the capsule and the internal  incision was enlarged to 3 mm with the crescent knife.  The intraocular lens was folded and inserted into the capsular bag using the [employer].  Irrigation/aspiration was used to remove the residual DisCoVisc.  Miostat was injected into the anterior chamber through the paracentesis track to inflate the anterior chamber and induce miosis.  The wound was checked for leaks and none were found. The conjunctiva was closed with cautery and the bridle sutures were removed.  Two drops of 0.3% Vigamox were placed on the eye.   An eye shield was placed on the eye.  The patient was discharged to the recovery room in good condition.  An AcrySert delivery system was used instead of Librarian, academic. A tenth of a milliliter containing 1 mg of drug was injected via the paracentesis tract.    ____________________________ Loura Back. Reilley Latorre, MD sad:DT D: 11/05/2013 13:18:36 ET T: 11/05/2013 13:48:36 ET JOB#: [id number]  cc: Remo Lipps A. Gordon Vandunk, MD, <Dictator> Martie Lee MD ELECTRONICALLY SIGNED 11/12/2013 14:02

## 2014-07-13 NOTE — Op Note (Signed)
PATIENT NAME:  [name], [name] MR#:  [id number] DATE OF BIRTH:  1940-04-04  DATE OF PROCEDURE:  01/07/2014  PREOPERATIVE DIAGNOSIS: Cataract, right eye.  POSTOPERATIVE DIAGNOSIS: Cataract, right eye.   PROCEDURE PERFORMED: Extracapsular cataract extraction using phacoemulsification with placement of Alcon SN6CWS, 20.0 diopter posterior chamber lens, serial number [id number].   ANESTHESIA: Four percent (4%) lidocaine and 0.5% Marcaine in a 50:50 mixture with 10 units/mL of Hylenex added, given as a peribulbar.   ANESTHESIOLOGIST: Amy M. Rice, MD  COMPLICATIONS: None.   ESTIMATED BLOOD LOSS: Less than 1 mL.   DESCRIPTION OF PROCEDURE:  The patient was brought to the operating room and given a peribulbar block.  The patient was then prepped and draped in the usual fashion.  The vertical rectus muscles were imbricated using 5-0 silk sutures.  These sutures were then clamped to the sterile drapes as bridle sutures.  A limbal peritomy was performed extending two clock hours and hemostasis was obtained with cautery.  A partial thickness scleral groove was made at the surgical limbus and dissected anteriorly in a lamellar dissection using an Alcon crescent knife.  The anterior chamber was entered superonasally with a Superblade and through the lamellar dissection with a 2.6 mm keratome.  DisCoVisc was used to replace the aqueous and a continuous tear capsulorrhexis was carried out.  Hydrodissection and hydrodelineation were carried out with balanced salt and a 27 gauge canula.  The nucleus was rotated to confirm the effectiveness of the hydrodissection.  Phacoemulsification was carried out using a divide-and-conquer technique.  Total ultrasound time was 2 minutes and 14 seconds with an average power of 27.8%. CDE of 57.40. No suture was placed.  Irrigation/aspiration was used to remove the residual cortex.  DisCoVisc was used to inflate the capsule and the internal incision was enlarged to 3 mm with  the crescent knife.  The intraocular lens was folded and inserted into the capsular bag using the AcrySert delivery system.  Irrigation/aspiration was used to remove the residual DisCoVisc.  Miostat was injected into the anterior chamber through the paracentesis track to inflate the anterior chamber and induce miosis, and 0.1 mL of cefuroxime containing 1 mg of drug was injected via the paracentesis tract. The wound was checked for leaks and none were found. The conjunctiva was closed with cautery and the bridle sutures were removed.  Two drops of 0.3% Vigamox were placed on the eye.   An eye shield was placed on the eye.  The patient was discharged to the recovery room in good condition.   ____________________________ Ian Back Lesslie Mossa, MD sad:MT D: 01/07/2014 13:22:15 ET T: 01/07/2014 15:52:02 ET JOB#: [id number]  cc: Remo Lipps A. Penelope Fittro, MD, <Dictator> Martie Lee MD ELECTRONICALLY SIGNED 01/14/2014 12:38

## 2014-07-17 ENCOUNTER — Ambulatory Visit (HOSPITAL_COMMUNITY)
Admission: RE | Admit: 2014-07-17 | Discharge: 2014-07-17 | Disposition: A | Discharge status: Home / Self Care | Payer: Medicare Other | Source: Ambulatory Visit | Attending: Emergency Medicine | Admitting: Emergency Medicine

## 2014-07-17 ENCOUNTER — Telehealth: Payer: Self-pay | Admitting: Emergency Medicine

## 2014-07-17 DIAGNOSIS — R911 Solitary pulmonary nodule: Secondary | ICD-10-CM

## 2014-07-17 NOTE — Telephone Encounter (Signed)
RB had an opening at 2pm tomorrow. I have moved pt's appointment to then. Pt is aware of this change. Nothing further was needed.

## 2014-07-17 NOTE — Telephone Encounter (Signed)
Gonna need to creatively fit pt in to review the PET scan. thanks

## 2014-07-17 NOTE — Telephone Encounter (Signed)
Pt had his PET scan rescheduled from today (4/27) to tomorrow (4/28) at 1000. Pt has appt with RB on 4/28 at 945 to review PET scan. RB does not have any openings till 5/24.   RB please advise when pt can be worked in. Thanks.

## 2014-07-18 ENCOUNTER — Ambulatory Visit: Payer: Medicare Other | Admitting: Emergency Medicine

## 2014-07-18 ENCOUNTER — Ambulatory Visit (INDEPENDENT_AMBULATORY_CARE_PROVIDER_SITE_OTHER): Payer: Medicare Other | Admitting: Emergency Medicine

## 2014-07-18 ENCOUNTER — Encounter (HOSPITAL_COMMUNITY)
Admission: RE | Admit: 2014-07-18 | Discharge: 2014-07-18 | Disposition: A | Discharge status: Home / Self Care | Payer: Medicare Other | Source: Ambulatory Visit | Attending: Emergency Medicine | Admitting: Emergency Medicine

## 2014-07-18 ENCOUNTER — Encounter: Payer: Self-pay | Admitting: Emergency Medicine

## 2014-07-18 ENCOUNTER — Other Ambulatory Visit: Payer: Self-pay | Admitting: Emergency Medicine

## 2014-07-18 VITALS — BP 138/80 | HR 83 | Ht 70.0 in | Wt 192.0 lb

## 2014-07-18 DIAGNOSIS — N281 Cyst of kidney, acquired: Secondary | ICD-10-CM | POA: Diagnosis not present

## 2014-07-18 DIAGNOSIS — R911 Solitary pulmonary nodule: Secondary | ICD-10-CM | POA: Insufficient documentation

## 2014-07-18 DIAGNOSIS — J449 Chronic obstructive pulmonary disease, unspecified: Secondary | ICD-10-CM | POA: Diagnosis not present

## 2014-07-18 LAB — GLUCOSE, CAPILLARY: Glucose-Capillary: 110 mg/dL — ABNORMAL HIGH (ref 70–99)

## 2014-07-18 MED ORDER — FLUDEOXYGLUCOSE F - 18 (FDG) INJECTION
9.5000 | Freq: Once | INTRAVENOUS | Status: AC | PRN
  Administered 2014-07-18: 9.5 via INTRAVENOUS

## 2014-07-18 NOTE — Assessment & Plan Note (Signed)
Some worsening recently due to allergic rhinitis. He is on a good rhinitis regimen. I will continue his current medications as well as bronchodilators Advair and Spiriva

## 2014-07-18 NOTE — Assessment & Plan Note (Addendum)
Right upper lobe nodule measures 12 mm and is hypermetabolic on PET scan performed today. This is concerning for a possible early primary lung cancer. I discussed the pros and cons of biopsy versus watchful waiting and repeating a CT scan. I do not believe he will be a good candidate for a primary surgical resection even know he does not appear to have any distant disease based on his PET scan. If we believe that he could be referred for surgery then I will repeat his PFTs at some point in the future. For now we have agreed to repeat his CT scan of the chest in July to look for interval change. If there is any interval change or if he develops symptoms in the interim then I will push for now additional bronchoscopy and biopsies, probably with fiducial placement.

## 2014-07-18 NOTE — Patient Instructions (Signed)
Please continue your inhaled medications as you are taking them We will repeat your CT scan of the chest in July to compare with your current films. Follow with Dr. Lamonte Sakai in July after the CT to review

## 2014-07-18 NOTE — Progress Notes (Signed)
Subjective:  Patient ID: [name], male    DOB: 1940/12/23, 74 y.o.   MRN: [id number] HPI 74 yo smoker (~100pk-yrs), hx of CAD/PTCI, HTN,  prostate CA. Dx with COPD around 10 yrs ago. Last seen 2007 by PW. Maintanance was Advair bid. Previously on Spiriva, stopped in 2008.   ROV 04/06/13 -- severe COPD, seen for an AE in mid December. He got better on pred, then a little more cough. Still smoking. Returns today describing cough and mucous, not as bad as the exacerbation.    ROV 06/04/13 -- severe COPD, also CAD. He reports today that he has had some vertigo that feels like spinning. Happens when he changes position. He has had this before several yrs ago. He is on advair + spiriva. He has had some episodes of dyspnea, had to use his neb x 2 last week. Otherwise doing well. He is still smoking 0.5 pk a day.   05/31/14 Acute OV : severe COPD  Presents for an acute office visit.  Complains of productive cough with pale green mucus, increased SOB with wheezing and chest tightness x 2 weeks.  Denies f/c/s, n/v, hemoptysis and edema  No recnet abx . No ER visit.  No otc used.  >>Levaquin and pred taper   Follow up : Severe COPD 06/28/14 Patient returns for a three-week follow-up. Patient was seen in the office 3 weeks ago for COPD exacerbation. He was treated with Levaquin and a prednisone taper. Patient is feeling much improved with decreased cough and wheezing. Has minimally productive with white mucus.  He does continue smoke. We discussed smoking cessation. Chest x-ray last visit. Head showed a right upper lobe nodule. Repeat chest x-ray today shows a persistent questionable pulmonary nodule in the right upper lobe. No weight loss or hemoptysis  He denies any chest pain, orthopnea, PND, or increased leg swelling  ROV 07/18/14 -- follow-up visit for severe COPD, continued tobacco use. He underwent a chest x-ray 06/28/14 that showed a right upper lobe nodule. A subsequent CT scan of the chest  performed 4/14 showed a 12 mm cavitary right upper lobe nodule without any evidence of social lymphadenopathy. PET scan performed 07/18/14 shows malignant range hypermetabolism in the right upper lobe nodule without any clear associated hilar or mediastinal adenopathy. No TB exposure, no real cough now. No wt loss - he has actually gained some.      Objective:  Physical Exam Filed Vitals:   07/18/14 1349  BP: 138/80  Pulse: 83  Height: '5\' 10"'$  (1.778 m)  Weight: 192 lb (87.091 kg)  SpO2: 95%    Gen: Pleasant, obese man, in no distress,  normal affect  ENT: No lesions,  mouth clear,  oropharynx clear, no postnasal drip  Neck: No JVD, no TMG, no carotid bruits  Lungs: No use of accessory muscles, Decreased BS in bases , no wheezing   Cardiovascular: RRR, heart sounds normal, no murmur or gallops, no peripheral edema  Musculoskeletal: No deformities, no cyanosis or clubbing  Neuro: alert, non focal  Skin: Warm, no lesions or rashes  PET 07/18/14 --  I personally reviewed the films and reviewed with the patient FINDINGS: NECK  No hypermetabolic lymph nodes in the neck.  CHEST  No hypermetabolic mediastinal or hilar nodes. Calcified atherosclerotic disease involves the thoracic aorta as well as the right coronary artery, left anterior descending artery and left circumflex Coronary artery. No enlarged or hypermetabolic mediastinal lymph nodes. No hilar adenopathy. There is no pleural effusion  identified. Moderate changes of paraseptal emphysema identified. There is diffuse bronchial wall thickening identified. Patchy areas of basilar and subpleural ground-glass attenuation and consolidation are noted at the right base which is likely post infectious or inflammatory. Nonspecific increased uptake is identified within the distal esophagus. The SUV max is equal to 6.9. There is no mass noted.  Pulmonary nodule within the right upper lobe is either part solid or cavitary.  This measures 1.4 cm and has an SUV max equal 5.1. No additional hypermetabolic nodules identified.  ABDOMEN/PELVIS  Gastrohepatic ligament lymph node is prominent measuring 1.3 cm. The SUV max associated with this node is equal to 2.68. No abnormal uptake identified within the liver, pancreas, spleen or adrenal glands. Left renal cyst is noted. There is calcified atherosclerotic disease involving the abdominal aorta. The infrarenal aorta has an AP dimension of 3.8 cm. No hypermetabolic pelvic or inguinal adenopathy identified. Seed implants noted within the prostate gland.  SKELETON  No focal hypermetabolic activity to suggest skeletal metastasis.  IMPRESSION: 1. Nodule within the right upper lobe which is either cavitary or part solid exhibits malignant range FDG uptake and is worrisome for malignancy. No evidence for hypermetabolic hilar or mediastinal adenopathy. There is no diagnostic evidence for distant metastatic disease. 2. Mild increased uptake within the distal esophagus may be related to esophagitis. No corresponding mass noted. 3. There is a borderline enlarged gastrohepatic ligament lymph node which exhibits mild, low level nonspecific FDG uptake. 4. Atherosclerotic disease including 3 vessel coronary artery calcification and infrarenal abdominal aortic aneurysm. Regarding the abdominal aortic aneurysm Recommend followup by ultrasound in 2 years. This recommendation follows ACR consensus guidelines: White Paper of the ACR Incidental Findings Committee II on Vascular Findings. J Am Coll Radiol 2013; 10:789-794. 5. Kidney cyst   Assessment & Plan:  Lung nodule Right upper lobe nodule measures 12 mm and is hypermetabolic on PET scan performed today. This is concerning for a possible early primary lung cancer. I discussed the pros and cons of biopsy versus watchful waiting and repeating a CT scan. I do not believe he will be a good candidate for a primary  surgical resection even know he does not appear to have any distant disease based on his PET scan. If we believe that he could be referred for surgery then I will repeat his PFTs at some point in the future. For now we have agreed to repeat his CT scan of the chest in July to look for interval change. If there is any interval change or if he develops symptoms in the interim then I will push for now additional bronchoscopy and biopsies, probably with fiducial placement.    Chronic obstructive pulmonary disease Some worsening recently due to allergic rhinitis. He is on a good rhinitis regimen. I will continue his current medications as well as bronchodilators Advair and Spiriva    More than 75% of this 30 minute visit was spent reviewing the PET scan and discussing options, counseling the patient.,

## 2014-09-16 ENCOUNTER — Other Ambulatory Visit: Payer: Self-pay | Admitting: Emergency Medicine

## 2014-09-20 ENCOUNTER — Other Ambulatory Visit: Payer: Self-pay | Admitting: Family Medicine

## 2014-09-24 DIAGNOSIS — Z961 Presence of intraocular lens: Secondary | ICD-10-CM | POA: Diagnosis not present

## 2014-10-03 ENCOUNTER — Ambulatory Visit (INDEPENDENT_AMBULATORY_CARE_PROVIDER_SITE_OTHER)
Admission: RE | Admit: 2014-10-03 | Discharge: 2014-10-03 | Disposition: A | Discharge status: Home / Self Care | Payer: Medicare Other | Source: Ambulatory Visit | Attending: Emergency Medicine | Admitting: Emergency Medicine

## 2014-10-03 DIAGNOSIS — R911 Solitary pulmonary nodule: Secondary | ICD-10-CM

## 2014-10-03 DIAGNOSIS — R918 Other nonspecific abnormal finding of lung field: Secondary | ICD-10-CM | POA: Diagnosis not present

## 2014-10-03 DIAGNOSIS — J432 Centrilobular emphysema: Secondary | ICD-10-CM | POA: Diagnosis not present

## 2014-10-03 DIAGNOSIS — J9809 Other diseases of bronchus, not elsewhere classified: Secondary | ICD-10-CM | POA: Diagnosis not present

## 2014-10-17 ENCOUNTER — Telehealth: Payer: Self-pay | Admitting: Emergency Medicine

## 2014-10-17 ENCOUNTER — Encounter: Payer: Self-pay | Admitting: Emergency Medicine

## 2014-10-17 ENCOUNTER — Ambulatory Visit (INDEPENDENT_AMBULATORY_CARE_PROVIDER_SITE_OTHER): Payer: Medicare Other | Admitting: Emergency Medicine

## 2014-10-17 VITALS — BP 142/92 | HR 82 | Ht 70.0 in | Wt 195.0 lb

## 2014-10-17 DIAGNOSIS — J449 Chronic obstructive pulmonary disease, unspecified: Secondary | ICD-10-CM | POA: Diagnosis not present

## 2014-10-17 DIAGNOSIS — J209 Acute bronchitis, unspecified: Secondary | ICD-10-CM | POA: Diagnosis not present

## 2014-10-17 DIAGNOSIS — R911 Solitary pulmonary nodule: Secondary | ICD-10-CM | POA: Diagnosis not present

## 2014-10-17 MED ORDER — AZITHROMYCIN 250 MG PO TABS: ORAL_TABLET | ORAL | Status: DC

## 2014-10-17 NOTE — Assessment & Plan Note (Signed)
Right upper lobe nodule has grown on most recent CT scan of the chest to 1.5cm in diameter. I do suspect that this is a slow-growing primary lung cancer although he does have a history of prostate cancer as well. We discussed in detail the potential benefits of both transthoracic needle biopsy as well as bronchoscopy with biopsies. I believe that bronchoscopy will be the superior choice given the inability to place fiducial markers to facilitate radiation therapy should he need this. He would need to have this done under general anesthesia and I'll like for him to follow-up with his cardiologist to ensure that he doesn't need any further workup before we would proceed.

## 2014-10-17 NOTE — Assessment & Plan Note (Signed)
He has clear symptoms of acute bronchitis. I do not believe he is having bronchospasm. I would like to treat him with azithromycin

## 2014-10-17 NOTE — Assessment & Plan Note (Signed)
Severe disease. He continues to smoke and does not want to quit. We discussed this again today. I do not believe is having bronchospasm but I do suspect he has an acute bronchitis. I'll keep him on his same bronchodilators for now, avoid prednisone at this time

## 2014-10-17 NOTE — Patient Instructions (Signed)
Please continue your inhaled medications as you are taking them Please try to decrease your cigarettes as you're able Please take azithromycin as prescribed We will arrange for you to have a navigational bronchoscopy to sample your right upper lobe nodule We will arrange for you to see Dr. Glennie Hawk to evaluate you before you have a lung procedure Follow with Dr Lamonte Sakai in 1 month

## 2014-10-17 NOTE — Addendum Note (Signed)
Addended by: Collene Gobble on: 10/17/2014 10:57 AM   Modules accepted: Miquel Dunn

## 2014-10-17 NOTE — Telephone Encounter (Signed)
Zithromax was not called in from appointment with RB today. Rx sent in. Patient notified.  Nothing further needed.

## 2014-10-17 NOTE — Progress Notes (Signed)
Subjective:  Patient ID: [name], male    DOB: Aug 16, 1940, 74 y.o.   MRN: [id number] HPI 74 yo smoker (~100pk-yrs), hx of CAD/PTCI, HTN,  prostate CA. Dx with COPD around 10 yrs ago. Last seen 2007 by PW. Maintanance was Advair bid. Previously on Spiriva, stopped in 2008.   ROV 04/06/13 -- severe COPD, seen for an AE in mid December. He got better on pred, then a little more cough. Still smoking. Returns today describing cough and mucous, not as bad as the exacerbation.    ROV 06/04/13 -- severe COPD, also CAD. He reports today that he has had some vertigo that feels like spinning. Happens when he changes position. He has had this before several yrs ago. He is on advair + spiriva. He has had some episodes of dyspnea, had to use his neb x 2 last week. Otherwise doing well. He is still smoking 0.5 pk a day.   05/31/14 Acute OV : severe COPD  Presents for an acute office visit.  Complains of productive cough with pale green mucus, increased SOB with wheezing and chest tightness x 2 weeks.  Denies f/c/s, n/v, hemoptysis and edema  No recnet abx . No ER visit.  No otc used.  >>Levaquin and pred taper   Follow up : Severe COPD 06/28/14 Patient returns for a three-week follow-up. Patient was seen in the office 3 weeks ago for COPD exacerbation. He was treated with Levaquin and a prednisone taper. Patient is feeling much improved with decreased cough and wheezing. Has minimally productive with white mucus.  He does continue smoke. We discussed smoking cessation. Chest x-ray last visit. Head showed a right upper lobe nodule. Repeat chest x-ray today shows a persistent questionable pulmonary nodule in the right upper lobe. No weight loss or hemoptysis  He denies any chest pain, orthopnea, PND, or increased leg swelling  ROV 07/18/14 -- follow-up visit for severe COPD, continued tobacco use. He underwent a chest x-ray 06/28/14 that showed a right upper lobe nodule. A subsequent CT scan of the chest  performed 4/14 showed a 12 mm cavitary right upper lobe nodule without any evidence of associated lymphadenopathy. PET scan performed 07/18/14 shows malignant range hypermetabolism in the right upper lobe nodule without any clear associated hilar or mediastinal adenopathy. No TB exposure, no real cough now. No wt loss - he has actually gained some.    ROV 10/17/14 -- follow-up visit for severe COPD. He continues to smoke. We have been following a right upper lobe 12 mm nodule that is hypermetabolic on PET scan performed 07/18/14. He underwent a repeat CT chest on 10/03/14 that I have personally reviewed.  This shows an interval increase in size in his right upper lobe cavitary nodule which now measures 1.5 x 1.5 cm. He also has stable left upper lobe nodules.  He continues to work 10 days out of the month.  He continues to smoke about 3/4 a pk a day.  He is followed for his CAD by Dr Glennie Hawk, last stent was in 2012.  He reports increased cough and some change in color his sputum to a greenish yellow. No fever. Some continued wheeze  Objective:  Physical Exam Filed Vitals:   10/17/14 1009  BP: 142/92  Pulse: 82  Height: '5\' 10"'$  (1.778 m)  Weight: 195 lb (88.451 kg)  SpO2: 96%    Gen: Pleasant, obese man, in no distress,  normal affect  ENT: No lesions,  mouth clear,  oropharynx clear, no  postnasal drip  Neck: No JVD, no TMG, no carotid bruits  Lungs: No use of accessory muscles, coarse breath sounds bilaterally, expiratory rhonchi  Cardiovascular: RRR, heart sounds normal, no murmur or gallops, no peripheral edema  Musculoskeletal: No deformities, no cyanosis or clubbing  Neuro: alert, non focal  Skin: Warm, no lesions or rashes   10/03/14 --  CLINICAL DATA: 74 year old male with history of right upper lobe nodule. Super D protocol require prior to bronchoscopy.  EXAM: CT CHEST WITHOUT CONTRAST  TECHNIQUE: Multidetector CT imaging of the chest was performed using thin  slice collimation for electromagnetic bronchoscopy planning purposes, without intravenous contrast.  COMPARISON: Chest CT 07/03/2014. PET-CT 07/18/2014.  FINDINGS: Mediastinum/Lymph Nodes: Heart size is normal. There is no significant pericardial fluid, thickening or pericardial calcification. There is atherosclerosis of the thoracic aorta, the great vessels of the mediastinum and the coronary arteries, including calcified atherosclerotic plaque in the left main, left anterior descending, left circumflex and right coronary arteries. Coronary artery stents in the left anterior descending and left circumflex coronary arteries. 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 Extensive calcifications of the aortic valve. No pathologically enlarged mediastinal or hilar lymph nodes. Please note that accurate exclusion of hilar adenopathy is limited on noncontrast CT scans. Esophagus is unremarkable in appearance. No axillary lymphadenopathy.  Lungs/Pleura: In the periphery of the right upper lobe (image 131 of series 4) there is again a thick-walled cavitary lesion with some internal architecture which currently measures 1.5 x 1.5 cm, concerning for small cavitary neoplasm. 4 mm left upper lobe pulmonary nodule (image 190 of series 4), similar prior. 5 mm left upper lobe pulmonary nodule near the apex (image 79 of series 4), similar prior. Mild diffuse bronchial wall thickening with mild centrilobular and paraseptal emphysema. Areas of bilateral apical pleuroparenchymal thickening and architectural distortion, most compatible with chronic post infectious or inflammatory areas of scarring. No acute consolidative airspace disease. No pleural effusions.  Upper  Abdomen: Small calcified granuloma in the spleen. Atherosclerosis in the visualized vasculature. 13 mm short axis gastrohepatic ligament lymph node is unchanged.  Musculoskeletal/Soft Tissues: There are no aggressive appearing lytic or blastic lesions noted in the visualized portions of the skeleton.  IMPRESSION: 1. Interval growth of being a 1.5 x 1.5 cm cavitary nodule in the right upper lobe, which remains concerning for potential cavitary neoplasm. 2. Other small 4 mm and 5 mm left upper lobe pulmonary nodules are unchanged. 3. Mild diffuse bronchial wall thickening with mild centrilobular and paraseptal emphysema; imaging findings suggestive of underlying COPD. 4. Atherosclerosis, including left main and 3 vessel coronary artery disease. Please note that although the presence of coronary artery calcium documents the presence of coronary artery disease, the severity of this disease and any potential stenosis cannot be assessed on this non-gated CT examination. There is, however, evidence of prior myocardial infarctions in the left anterior descending and left circumflex coronary artery territories, as discussed above. Assessment for potential risk factor modification, dietary therapy or pharmacologic therapy may be warranted, if clinically indicated. 5. There are calcifications of the aortic valve. Echocardiographic correlation for evaluation of potential valvular dysfunction may be warranted if clinically indicated. 6. Additional incidental findings, similar priors, as above.   Assessment & Plan:  Chronic obstructive pulmonary disease Severe disease. He continues to smoke and does not want to quit. We  discussed this again today. I do not believe is having bronchospasm but I do suspect he has an acute bronchitis. I'll keep him on his same bronchodilators for now, avoid prednisone at this time  Lung nodule Right upper lobe nodule has grown on most recent CT scan of the  chest to 1.5cm in diameter. I do suspect that this is a slow-growing primary lung cancer although he does have a history of prostate cancer as well. We discussed in detail the potential benefits of both transthoracic needle biopsy as well as bronchoscopy with biopsies. I believe that bronchoscopy will be the superior choice given the inability to place fiducial markers to facilitate radiation therapy should he need this. He would need to have this done under general anesthesia and I'll like for him to follow-up with his cardiologist to ensure that he doesn't need any further workup before we would proceed.   Acute bronchitis He has clear symptoms of acute bronchitis. I do not believe he is having bronchospasm. I would like to treat him with azithromycin

## 2014-10-20 ENCOUNTER — Other Ambulatory Visit: Payer: Self-pay | Admitting: Family Medicine

## 2014-10-29 ENCOUNTER — Telehealth: Payer: Self-pay | Admitting: Family Medicine

## 2014-10-29 DIAGNOSIS — R972 Elevated prostate specific antigen [PSA]: Secondary | ICD-10-CM

## 2014-10-29 DIAGNOSIS — R739 Hyperglycemia, unspecified: Secondary | ICD-10-CM

## 2014-10-29 DIAGNOSIS — E785 Hyperlipidemia, unspecified: Secondary | ICD-10-CM

## 2014-10-29 DIAGNOSIS — I10 Essential (primary) hypertension: Secondary | ICD-10-CM

## 2014-10-29 NOTE — Telephone Encounter (Signed)
-----   Message from Ellamae Sia sent at 10/23/2014  3:39 PM EDT ----- Regarding: Lab orders for Wednesday, 8.10.16 Patient is scheduled for CPX labs, please order future labs, Thanks , Karna Christmas

## 2014-10-30 ENCOUNTER — Other Ambulatory Visit (INDEPENDENT_AMBULATORY_CARE_PROVIDER_SITE_OTHER): Payer: Medicare Other

## 2014-10-30 DIAGNOSIS — R972 Elevated prostate specific antigen [PSA]: Secondary | ICD-10-CM

## 2014-10-30 DIAGNOSIS — E785 Hyperlipidemia, unspecified: Secondary | ICD-10-CM

## 2014-10-30 DIAGNOSIS — R739 Hyperglycemia, unspecified: Secondary | ICD-10-CM | POA: Diagnosis not present

## 2014-10-30 DIAGNOSIS — I10 Essential (primary) hypertension: Secondary | ICD-10-CM

## 2014-10-30 LAB — CBC WITH DIFFERENTIAL/PLATELET
Basophils Absolute: 0.1 10*3/uL (ref 0.0–0.1)
Basophils Relative: 0.6 % (ref 0.0–3.0)
EOS ABS: 0.4 10*3/uL (ref 0.0–0.7)
Eosinophils Relative: 4.6 % (ref 0.0–5.0)
HEMATOCRIT: 46 % (ref 39.0–52.0)
HEMOGLOBIN: 15.6 g/dL (ref 13.0–17.0)
Lymphocytes Relative: 16.5 % (ref 12.0–46.0)
Lymphs Abs: 1.4 10*3/uL (ref 0.7–4.0)
MCHC: 33.9 g/dL (ref 30.0–36.0)
MCV: 98.5 fl (ref 78.0–100.0)
MONO ABS: 0.6 10*3/uL (ref 0.1–1.0)
Monocytes Relative: 6.9 % (ref 3.0–12.0)
NEUTROS ABS: 6.2 10*3/uL (ref 1.4–7.7)
NEUTROS PCT: 71.4 % (ref 43.0–77.0)
Platelets: 242 10*3/uL (ref 150.0–400.0)
RBC: 4.66 Mil/uL (ref 4.22–5.81)
RDW: 13.3 % (ref 11.5–15.5)
WBC: 8.7 10*3/uL (ref 4.0–10.5)

## 2014-10-30 LAB — LIPID PANEL
CHOLESTEROL: 157 mg/dL (ref 0–200)
HDL: 44.5 mg/dL (ref >39.00)
LDL CALC: 102 mg/dL — AB (ref 0–99)
NonHDL: 112.75
TRIGLYCERIDES: 56 mg/dL (ref 0.0–149.0)
Total CHOL/HDL Ratio: 4
VLDL: 11.2 mg/dL (ref 0.0–40.0)

## 2014-10-30 LAB — COMPREHENSIVE METABOLIC PANEL
ALT: 10 U/L (ref 0–53)
AST: 15 U/L (ref 0–37)
Albumin: 4.1 g/dL (ref 3.5–5.2)
Alkaline Phosphatase: 40 U/L (ref 39–117)
BILIRUBIN TOTAL: 0.4 mg/dL (ref 0.2–1.2)
BUN: 15 mg/dL (ref 6–23)
CO2: 31 meq/L (ref 19–32)
Calcium: 9.6 mg/dL (ref 8.4–10.5)
Chloride: 102 mEq/L (ref 96–112)
Creatinine, Ser: 1.09 mg/dL (ref 0.40–1.50)
GFR: 70.22 mL/min (ref >60.00)
Glucose, Bld: 107 mg/dL — ABNORMAL HIGH (ref 70–99)
Potassium: 4.2 mEq/L (ref 3.5–5.1)
SODIUM: 140 meq/L (ref 135–145)
TOTAL PROTEIN: 6.6 g/dL (ref 6.0–8.3)

## 2014-10-30 LAB — TSH: TSH: 1.34 u[IU]/mL (ref 0.35–4.50)

## 2014-10-30 LAB — PSA: PSA: 0.22 ng/mL (ref 0.10–4.00)

## 2014-10-30 LAB — HEMOGLOBIN A1C: HEMOGLOBIN A1C: 5.9 % (ref 4.6–6.5)

## 2014-11-06 ENCOUNTER — Ambulatory Visit (INDEPENDENT_AMBULATORY_CARE_PROVIDER_SITE_OTHER): Payer: Medicare Other | Admitting: Family Medicine

## 2014-11-06 ENCOUNTER — Encounter: Payer: Self-pay | Admitting: Family Medicine

## 2014-11-06 VITALS — BP 132/80 | HR 82 | Temp 98.7°F | Ht 68.25 in | Wt 194.2 lb

## 2014-11-06 DIAGNOSIS — Z0000 Encounter for general adult medical examination without abnormal findings: Secondary | ICD-10-CM | POA: Diagnosis not present

## 2014-11-06 DIAGNOSIS — Z8546 Personal history of malignant neoplasm of prostate: Secondary | ICD-10-CM

## 2014-11-06 DIAGNOSIS — I10 Essential (primary) hypertension: Secondary | ICD-10-CM

## 2014-11-06 DIAGNOSIS — E785 Hyperlipidemia, unspecified: Secondary | ICD-10-CM

## 2014-11-06 DIAGNOSIS — R739 Hyperglycemia, unspecified: Secondary | ICD-10-CM

## 2014-11-06 DIAGNOSIS — Z1211 Encounter for screening for malignant neoplasm of colon: Secondary | ICD-10-CM

## 2014-11-06 DIAGNOSIS — F17210 Nicotine dependence, cigarettes, uncomplicated: Secondary | ICD-10-CM

## 2014-11-06 MED ORDER — PRAVASTATIN SODIUM 40 MG PO TABS: 40.0000 mg | ORAL_TABLET | Freq: Every day | ORAL | Status: DC

## 2014-11-06 NOTE — Patient Instructions (Addendum)
Please do the IFOB stool kit  Get your flu shot in the fall  If you are interested in a shingles/zoster vaccine - call your insurance to check on coverage,( you should not get it within 1 month of other vaccines) , then call us for a prescription  for it to take to a pharmacy that gives the shot , or make a nurse visit to get it here depending on your coverage   I would like you to get back on the pravastatin for your cholesterol , there is a supplement called coenzyme Q-10 - that you can get over the counter - it may help decrease any aches or pains you get from the cholesterol medicine (talk to your cardiologist about it at your upcoming appointment)

## 2014-11-06 NOTE — Progress Notes (Signed)
Subjective:    Patient ID: [name], male    DOB: June 21, 1940, 74 y.o.   MRN: [id number]  HPI Here for annual medicare wellness visit as well as chronic/acute medical problems as well as annual preventative exam   I have personally reviewed the Medicare Annual Wellness questionnaire and have noted 1. The patient's medical and social history 2. Their use of alcohol, tobacco or illicit drugs 3. Their current medications and supplements 4. The patient's functional ability including ADL's, fall risks, home safety risks and hearing or visual             impairment. 5. Diet and physical activities 6. Evidence for depression or mood disorders  The patients weight, height, BMI have been recorded in the chart and visual acuity is per eye clinic.  I have made referrals, counseling and provided education to the patient based review of the above and I have provided the pt with a written personalized care plan for preventive services. Reviewed and updated provider list, see scanned forms.  See scanned forms.  Routine anticipatory guidance given to patient.  See health maintenance. Colon cancer screening 2/03 - did have polyps , he declines another one - he cannot tolerate the prep  Flu vaccine 10/15- does not miss them- will get one in the fall - gets at the fire dept  Tetanus vaccine 3/07  Pneumovax - complete on both  Zoster vaccine - has not given it a lot of thought - cannot afford it /ins does not cover  Prostate cancer screening  Lab Results  Component Value Date   PSA 0.22 10/30/2014   PSA 5.23* 09/18/2008   PSA 5.76 05/26/2005   sees urologist yearly  Advance directive - has living will and POA already set up  Cognitive function addressed- see scanned forms- and if abnormal then additional documentation follows.  Not bad memory -does pretty well   PMH and SH reviewed  Meds, vitals, and allergies reviewed.   ROS: See HPI.  Otherwise negative.      lung nodule- pulmonary  is following- may have a bronchoscopy soon  Has cardiol f/u soon to clear him for that  He is anxious about it   COPD is an ongoing problem  Smoking status He has cut down to 3/4 of a pack  No quit date yet -not ready   bp is stable today (usually a bit lower)  No cp or palpitations or headaches or edema  No side effects to medicines  BP Readings from Last 3 Encounters:  11/06/14 142/86  10/17/14 142/92  07/18/14 138/80     Hyperglycemia Lab Results  Component Value Date   HGBA1C 5.9 10/30/2014   This is down from 6 He does not eat much sugar - a rare dessert , and 1/4 spoon in coffee  Loves bread - has to control that   Cholesterol in setting of CAD Lab Results  Component Value Date   CHOL 157 10/30/2014   CHOL 161 03/04/2014   CHOL 169 12/10/2013   Lab Results  Component Value Date   HDL 44.50 10/30/2014   HDL 47.80 03/04/2014   HDL 46.60 12/10/2013   Lab Results  Component Value Date   LDLCALC 102* 10/30/2014   LDLCALC 99 03/04/2014   LDLCALC 98 12/10/2013   Lab Results  Component Value Date   TRIG 56.0 10/30/2014   TRIG 72.0 03/04/2014   TRIG 124.0 12/10/2013   Lab Results  Component Value Date   CHOLHDL  4 10/30/2014   CHOLHDL 3 03/04/2014   CHOLHDL 4 12/10/2013   No results found for: LDLDIRECT  Exercise is limited due to his copd / he does a lot of walking for his job , very active lifestyle  Not a lot of energy  LDL is 102  On pravastatin 40 mg - has missed about a week of it before he had his labs checked - occ makes his legs hurt      Hx of prostate cancer/ treated Sees urology Lab Results  Component Value Date   PSA 0.22 10/30/2014   PSA 5.23* 09/18/2008   PSA 5.76 05/26/2005      Chemistry      Component Value Date/Time   NA 140 10/30/2014 0822   K 4.2 10/30/2014 0822   CL 102 10/30/2014 0822   CO2 31 10/30/2014 0822   BUN 15 10/30/2014 0822   CREATININE 1.09 10/30/2014 0822      Component Value Date/Time   CALCIUM 9.6  10/30/2014 0822   ALKPHOS 40 10/30/2014 0822   AST 15 10/30/2014 0822   ALT 10 10/30/2014 0822   BILITOT 0.4 10/30/2014 0822      Lab Results  Component Value Date   WBC 8.7 10/30/2014   HGB 15.6 10/30/2014   HCT 46.0 10/30/2014   MCV 98.5 10/30/2014   PLT 242.0 10/30/2014    Lab Results  Component Value Date   TSH 1.34 10/30/2014     Patient Active Problem List   Diagnosis Date Noted  . Colon cancer screening 11/06/2014  . Encounter for Medicare annual wellness exam 11/06/2014  . Routine general medical examination at a health care facility 11/06/2014  . Acute bronchitis 10/17/2014  . Lung nodule 06/28/2014  . Carotid stenosis 12/20/2013  . Aftercare following surgery of the circulatory system 12/20/2013  . Aftercare following surgery of the circulatory system, [place] 12/19/2012  . Diarrhea 08/28/2012  . Personal history of colonic polyps 08/28/2012  . Hyperglycemia 08/07/2012  . Boils 04/03/2012  . Intertrigo 04/03/2012  . Pre-operative cardiovascular examination 11/03/2011  . Carotid artery disease 05/20/2011  . Grief reaction 05/07/2011  . Ischemic cardiomyopathy 01/13/2011  . Palpitations 11/10/2010  . Occlusion and stenosis of carotid artery without mention of cerebral infarction 04/02/2010  . CHEST PAIN 04/02/2010  . ANXIETY 12/12/2009  . HYPERTENSION, BENIGN 09/29/2008  . History of prostate cancer 09/19/2008  . CHRONIC OBSTRUCTIVE PULMONARY DISEASE, ACUTE EXACERBATION 06/19/2007  . Hyperlipidemia 02/10/2007  . AMAUROSIS FUGAX 02/10/2007  . MYOCARDIAL INFARCTION, HX OF 02/10/2007  . Coronary atherosclerosis 02/10/2007  . PERIPHERAL VASCULAR DISEASE 02/10/2007  . HEMORRHOIDS 02/10/2007  . EMPHYSEMA 02/10/2007  . Chronic obstructive pulmonary disease 02/10/2007  . OSTEOARTHRITIS 02/10/2007  . Nicotine dependence 02/10/2007  . PURE HYPERCHOLESTEROLEMIA 01/27/2007  . CORONARY ATHEROSCLEROSIS NATIVE CORONARY ARTERY 01/27/2007   Past Medical History    Diagnosis Date  . COPD (chronic obstructive pulmonary disease)   . CAD (coronary artery disease)     Ant MI 63  . HTN (hypertension)   . Hyperlipidemia   . Osteoarthritis   . Peripheral vascular disease   . Prostate cancer   . Carotid artery disease     Right CEA 2002 per Dr. [name]  . Myocardial infarction   . GERD (gastroesophageal reflux disease)     occ  . CHF (congestive heart failure)     pt denies 09/25/12  . Anxiety   . PAC (premature atrial contraction)   . Pneumonia   . Hydradenitis   .  Stroke 11-24-11   Past Surgical History  Procedure Laterality Date  . Coronary stent placement  97, 2000, 2012    8 stents  . Doppler echocardiography  1995    neg  . Colonoscopy  04-2001    polyp  . Cardiovascular stress test  12-2006  . Cardiac catheterization    . Vascular surgery    . Endarterectomy  11/24/2011    Procedure: ENDARTERECTOMY CAROTID;  Surgeon: Rosetta Posner, MD;  Location: [facility];  Service: Vascular;  Laterality: Left;  [facility] surgery Left Aug. 17, 2015    Cataract  . Carotid endarterectomy Right 03-2000    CE  . Carotid endarterectomy Left 11-24-11    CE  . Prostate surgery      Radiation Tx  X's 40   Social History  Substance Use Topics  . Smoking status: Current Every Day Smoker -- 1.00 packs/day for 55 years    Types: Cigarettes  . Smokeless tobacco: Never Used     Comment: 3/4ppd (06/28/14)  . Alcohol Use: 0.0 oz/week    0 Standard drinks or equivalent per week     Comment: occ   Family History  Problem Relation Age of Onset  . Stroke Father   . Heart disease Father   . Heart disease Mother     pacemaker  . Other Brother     benign brain tumor  . Cancer Brother     Eye-behind  . Liver cancer Brother   . Diabetes Maternal Grandmother   . Cancer Sister     Breast   Allergies  Allergen Reactions  . Augmentin [Amoxicillin-Pot Clavulanate] Nausea And Vomiting    Diarrhea  . Sulfonamide Derivatives   . Zantac [Ranitidine Hcl]     diarrhea    Current Outpatient Prescriptions on File Prior to Visit  Medication Sig Dispense Refill  . albuterol (VENTOLIN HFA) 108 (90 BASE) MCG/ACT inhaler INHALE 2 PUFFS INTO THE LUNGS EVERY 4 (FOUR) HOURS AS NEEDED FOR WHEEZING OR SHORTNESS OF BREATH. 1 Inhaler 11  . ALPRAZolam (XANAX) 0.5 MG tablet Take 1 tablet (0.5 mg total) by mouth at bedtime as needed. Sleep or anxiety 30 tablet 0  . aspirin 81 MG tablet Take 81 mg by mouth daily.     . clopidogrel (PLAVIX) 75 MG tablet TAKE 1 TABLET BY MOUTH EVERY DAY 30 tablet 10  . Fluticasone-Salmeterol (ADVAIR DISKUS) 500-50 MCG/DOSE AEPB INHALE 1 PUFF INTO THE LUNGS EVERY 12 (TWELVE) HOURS. 60 each 11  . ipratropium-albuterol (DUONEB) 0.5-2.5 (3) MG/3ML SOLN INHALE CONTENTS OF 1 VIAL EVERY 4 HOURS AS NEEDED 90 mL 0  . loratadine (CLARITIN) 10 MG tablet TAKE 1 TABLET BY MOUTH EVERY DAY (NOT COVERED BY INSURANCE) 30 tablet 5  . metoprolol tartrate (LOPRESSOR) 25 MG tablet Take 0.5 tablets (12.5 mg total) by mouth 2 (two) times daily. 30 tablet 11  . nitroGLYCERIN (NITROSTAT) 0.4 MG SL tablet Place 1 tablet (0.4 mg total) under the tongue every 5 (five) minutes as needed. For chest pain 10 tablet 1  . SPIRIVA HANDIHALER 18 MCG inhalation capsule INHALE 1 CAPSULE VIA HANDIHALER ONCE DAILY AT THE SAME TIME EVERY DAY 30 capsule 5  . Tamsulosin HCl (FLOMAX) 0.4 MG CAPS Take 0.4 mg by mouth daily.      No current facility-administered medications on file prior to visit.     Review of Systems Review of Systems  Constitutional: Negative for fever, appetite change, and unexpected weight change. pos for fatigue  Eyes:  Negative for pain and visual disturbance.  Respiratory: pos for cough and pos for baseline shortness of breath.   Cardiovascular: Negative for cp or palpitations    Gastrointestinal: Negative for nausea, diarrhea and constipation.  Genitourinary: Negative for urgency and frequency.  Skin: Negative for pallor or rash   Neurological: Negative for  weakness, light-headedness, numbness and headaches.  Hematological: Negative for adenopathy. Does not bruise/bleed easily.  Psychiatric/Behavioral: Negative for dysphoric mood. The patient is not nervous/anxious.         Objective:   Physical Exam  Constitutional: He appears well-developed and well-nourished. No distress.  Frail appearing elderly male in good spirits   HENT:  Head: Normocephalic and atraumatic.  Right Ear: External ear normal.  Left Ear: External ear normal.  Nose: Nose normal.  Mouth/Throat: Oropharynx is clear and moist.  Eyes: Conjunctivae and EOM are normal. Pupils are equal, round, and reactive to light. Right eye exhibits no discharge. Left eye exhibits no discharge. No scleral icterus.  Neck: Normal range of motion. Neck supple. No JVD present. Carotid bruit is not present. No thyromegaly present.  Cardiovascular: Normal rate, regular rhythm and normal heart sounds.  Exam reveals no gallop.   Pedal pulses are difficult to palpate - but feet are warm and seem well perfused   Pulmonary/Chest: Effort normal. No respiratory distress. He has wheezes. He has no rales. He exhibits no tenderness.  Diffusely distant bs Exp wheezes and upper airway sounds improved after coughing  Fair air exch  Abdominal: Soft. Bowel sounds are normal. He exhibits no distension, no abdominal bruit and no mass. There is no tenderness.  Musculoskeletal: He exhibits no edema or tenderness.  Lymphadenopathy:    He has no cervical adenopathy.  Neurological: He is alert. He has normal reflexes. No cranial nerve deficit. He exhibits normal muscle tone. Coordination normal.  Skin: Skin is warm and dry. No rash noted. No erythema. No pallor.  Solar aging and keratoses noted   Psychiatric: He has a normal mood and affect.          Assessment & Plan:   Problem List Items Addressed This Visit    Colon cancer screening    IFOB kit given for colon cancer screening        Relevant Orders     Fecal occult blood, imunochemical   Encounter for Medicare annual wellness exam    Reviewed health habits including diet and exercise and skin cancer prevention Reviewed appropriate screening tests for age  Also reviewed health mt list, fam hx and immunization status , as well as social and family history   See HPI Labs reviewed  Please do the IFOB stool kit  Get your flu shot in the fall  If you are interested in a shingles/zoster vaccine - call your insurance to check on coverage,( you should not get it within 1 month of other vaccines) , then call us for a prescription  for it to take to a pharmacy that gives the shot , or make a nurse visit to get it here depending on your coverage      History of prostate cancer    In the past treated  Lab Results  Component Value Date   PSA 0.22 10/30/2014   PSA 5.23* 09/18/2008   PSA 5.76 05/26/2005   f/u with urol as planned No symptoms       Hyperglycemia    Lab Results  Component Value Date   HGBA1C 5.9 10/30/2014  stable / well  controlled Disc low glycemic diet to prevent DM       Hyperlipidemia    Disc goals for lipids and reasons to control them Rev labs with pt Rev low sat fat diet in detail Enc to get back on pravastatin to get to goal (vascular dz) Will try coenzyme Q 10 to see if this helps muscle aches with the statin       Relevant Medications   pravastatin (PRAVACHOL) 40 MG tablet   HYPERTENSION, BENIGN - Primary    bp in fair control at this time  BP Readings from Last 1 Encounters:  11/06/14 132/80   No changes needed Disc lifstyle change with low sodium diet and exercise  Labs reviewed       Relevant Medications   pravastatin (PRAVACHOL) 40 MG tablet   Nicotine dependence    Disc in detail risks of smoking and possible outcomes including copd, vascular/ heart disease, cancer , respiratory and sinus infections  Pt voices understanding Pt has copd and also a pulmonary nodule that is being investigated   He is not ready to quit unfortunately       Routine general medical examination at a health care facility    Reviewed health habits including diet and exercise and skin cancer prevention Reviewed appropriate screening tests for age  Also reviewed health mt list, fam hx and immunization status , as well as social and family history   See HPI Labs reviewed  Please do the IFOB stool kit  Get your flu shot in the fall  If you are interested in a shingles/zoster vaccine - call your insurance to check on coverage,( you should not get it within 1 month of other vaccines) , then call us for a prescription  for it to take to a pharmacy that gives the shot , or make a nurse visit to get it here depending on your coverage

## 2014-11-06 NOTE — Progress Notes (Signed)
Pre visit review using our clinic review tool, if applicable. No additional management support is needed unless otherwise documented below in the visit note. 

## 2014-11-07 NOTE — Assessment & Plan Note (Signed)
bp in fair control at this time  BP Readings from Last 1 Encounters:  11/06/14 132/80   No changes needed Disc lifstyle change with low sodium diet and exercise  Labs reviewed

## 2014-11-07 NOTE — Assessment & Plan Note (Signed)
Lab Results  Component Value Date   HGBA1C 5.9 10/30/2014  stable / well controlled Disc low glycemic diet to prevent DM

## 2014-11-07 NOTE — Assessment & Plan Note (Signed)
In the past treated  Lab Results  Component Value Date   PSA 0.22 10/30/2014   PSA 5.23* 09/18/2008   PSA 5.76 05/26/2005   f/u with urol as planned No symptoms

## 2014-11-07 NOTE — Assessment & Plan Note (Signed)
IFOB kit given for colon cancer screening

## 2014-11-07 NOTE — Assessment & Plan Note (Signed)
Reviewed health habits including diet and exercise and skin cancer prevention Reviewed appropriate screening tests for age  Also reviewed health mt list, fam hx and immunization status , as well as social and family history   See HPI Labs reviewed  Please do the IFOB stool kit  Get your flu shot in the fall  If you are interested in a shingles/zoster vaccine - call your insurance to check on coverage,( you should not get it within 1 month of other vaccines) , then call us for a prescription  for it to take to a pharmacy that gives the shot , or make a nurse visit to get it here depending on your coverage

## 2014-11-07 NOTE — Assessment & Plan Note (Signed)
Disc goals for lipids and reasons to control them Rev labs with pt Rev low sat fat diet in detail Enc to get back on pravastatin to get to goal (vascular dz) Will try coenzyme Q 10 to see if this helps muscle aches with the statin

## 2014-11-07 NOTE — Assessment & Plan Note (Signed)
Disc in detail risks of smoking and possible outcomes including copd, vascular/ heart disease, cancer , respiratory and sinus infections  Pt voices understanding Pt has copd and also a pulmonary nodule that is being investigated  He is not ready to quit unfortunately

## 2014-11-19 ENCOUNTER — Ambulatory Visit (INDEPENDENT_AMBULATORY_CARE_PROVIDER_SITE_OTHER): Payer: Medicare Other | Admitting: Physician Assistant

## 2014-11-19 ENCOUNTER — Encounter: Payer: Self-pay | Admitting: Physician Assistant

## 2014-11-19 VITALS — BP 148/80 | HR 73 | Ht 68.0 in | Wt 194.0 lb

## 2014-11-19 DIAGNOSIS — Z01810 Encounter for preprocedural cardiovascular examination: Secondary | ICD-10-CM

## 2014-11-19 NOTE — Patient Instructions (Signed)
Medication Instructions:  Your physician recommends that you continue on your current medications as directed. Please refer to the Current Medication list given to you today.   Labwork: None   Testing/Procedures: None   Follow-Up: Your physician recommends that you schedule a follow-up appointment with Dr.Mcalhany first available   Any Other Special Instructions Will Be Listed Below (If Applicable). You have been cleared for your upcoming surgery with Dr.Byrum

## 2014-11-19 NOTE — Progress Notes (Signed)
Cardiology Office Note   Date:  11/19/2014   ID:  [name], [name] 1940-12-05, MRN [id number]  PCP:  Loura Pardon, MD  Cardiologist:  Dr Asencion Islam, PA-C   Chief complaint: Preoperative evaluation  History of Present Illness: [name] is a 74 y.o. male with a history of CAD, COPD, HTN, HLD, GERD, PAD, and carotid dz, who is here today for cardiac follow up.   His cardiac history is significant for AMI in 1997 with BMS x 2 to the LAD, BMS to the CFX in 2002 and DES x 2 to the LAD 2009.   Cath on 04/14/10 with severe stenosis in the distal AV groove Circumflex and the OM, both treated with DES. His carotid artery dopplers 12/02/12 in [facility] office showed mild disease in RICA at CEA site and mild disease in the LICA CES site. He is followed by Dr. Lamonte Sakai for COPD. He stopped Losartan 2014 due to dizziness and hypotension.   [name] presents for preoperative evaluation for bronchoscopy and lung biopsy.  [name] has a limited activity level, but this is mostly due to his respiratory issues. He is able to walk a flight of steps, albeit slowly (because of his breathing). He never gets chest pain that is not relieved by antiacids.   He has no recent decrease in his activity level or significant new fatigue. 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 He has no bleeding issues. He is compliant with his medications. He has wheezing on a daily basis but this is not unusual for him. He had a positive stress test in 2012 prior to his last PCI. He denies lower extremity edema.   Past Medical History  Diagnosis Date  . COPD (chronic obstructive pulmonary disease)   . CAD (coronary artery disease)     AMI 1997 w/ BMS x 2 LAD, BMS CFX 2002, DES x 2 LAD 2009, DES CFX & OM 2012   . HTN (hypertension)   . Hyperlipidemia   . Osteoarthritis   . Peripheral vascular disease   . Prostate cancer   . Carotid artery disease     RICA CEA  6767, LICA CEA 2094 per Dr. [name],   . Myocardial infarction   . GERD (gastroesophageal reflux disease)     occ  . CHF (congestive heart failure)     pt denied in 2014  . Anxiety   . PAC (premature atrial contraction)   . Pneumonia   . Hydradenitis   . Stroke 11-24-11  . Ischemic cardiomyopathy     EF 40-45 percent at cath 2012    Past Surgical History  Procedure Laterality Date  . Coronary stent placement  1997, 2002, 2009, 2012    7 stents total  . Doppler echocardiography  1995    neg  . Colonoscopy  04-2001    polyp  . Cardiovascular stress test  12-2006  . Cardiac catheterization    . Vascular surgery    . Endarterectomy  11/24/2011    Procedure: ENDARTERECTOMY CAROTID;  Surgeon: Rosetta Posner, MD;  Location: [facility];  Service: Vascular;  Laterality: Left;  Marland Kitchen Eye surgery Left Aug. 17, 2015    Cataract  . Carotid endarterectomy Right 03-2000    CE  . Carotid endarterectomy Left 11-24-11    CE  . Prostate surgery      Radiation Tx  X's 40   Current Outpatient Prescriptions  Medication Sig Dispense Refill  . albuterol (VENTOLIN HFA) 108 (90 BASE) MCG/ACT inhaler INHALE 2 PUFFS INTO THE LUNGS EVERY 4 (FOUR) HOURS AS NEEDED FOR WHEEZING OR SHORTNESS OF BREATH. 1 Inhaler 11  . ALPRAZolam (XANAX) 0.5 MG tablet Take 1 tablet (0.5 mg total) by mouth at bedtime as needed. Sleep or anxiety 30 tablet 0  . aspirin 81 MG tablet Take 81 mg by mouth daily.     . clopidogrel (PLAVIX) 75 MG tablet TAKE 1 TABLET BY MOUTH EVERY DAY 30 tablet 10  . Fluticasone-Salmeterol (ADVAIR DISKUS) 500-50 MCG/DOSE AEPB INHALE 1 PUFF INTO THE LUNGS EVERY 12 (TWELVE) HOURS. 60 each 11  . ipratropium-albuterol (DUONEB) 0.5-2.5 (3) MG/3ML SOLN Take 3 mLs by nebulization every 4 (four) hours as needed (shortness of breath).    . loratadine (CLARITIN) 10 MG tablet TAKE 1 TABLET BY MOUTH EVERY DAY (NOT COVERED BY INSURANCE) 30 tablet 5  . metoprolol tartrate (LOPRESSOR) 25 MG tablet Take 0.5 tablets (12.5 mg total)  by mouth 2 (two) times daily. 30 tablet 11  . nitroGLYCERIN (NITROSTAT) 0.4 MG SL tablet Place 1 tablet (0.4 mg total) under the tongue every 5 (five) minutes as needed. For chest pain 10 tablet 1  . pravastatin (PRAVACHOL) 40 MG tablet Take 1 tablet (40 mg total) by mouth daily. 30 tablet 11  . SPIRIVA HANDIHALER 18 MCG inhalation capsule INHALE 1 CAPSULE VIA HANDIHALER ONCE DAILY AT THE SAME TIME EVERY DAY 30 capsule 5  . Tamsulosin HCl (FLOMAX) 0.4 MG CAPS Take 0.4 mg by mouth daily.      No current facility-administered medications for this visit.    Allergies:   Augmentin; Sulfonamide derivatives; and Zantac    Social History:  The patient  reports that he has been smoking Cigarettes.  He has a 55 pack-year smoking history. He has never used smokeless tobacco. He reports that he drinks alcohol. He reports that he does not use illicit drugs.   Family History:  The patient's family history includes Cancer in his brother and sister; Diabetes in his maternal grandmother; Heart disease in his father and mother; Liver cancer in his brother; Other in his brother; Stroke in his father.    ROS:  Please see the history of present illness. All other systems are reviewed and negative.    PHYSICAL EXAM: VS:  BP 148/80 mmHg  Pulse 73  Ht '5\' 8"'$  (1.727 m)  Wt 194 lb (87.998 kg)  BMI 29.50 kg/m2 , BMI Body mass index is 29.5 kg/(m^2). GEN: Well nourished, well developed, in no acute distress HEENT: normal, except for dentition Neck: no JVD, soft bilateral carotid bruits, no masses Cardiac: RRR; no murmurs, rubs, or gallops, no edema, distal pulses are intact  Respiratory:  Few rales, slight expiratory wheeze GI: soft, nontender, nondistended, + BS MS: no deformity or atrophy Skin: warm and dry, no rash, some lower extremity mottling Neuro:  Strength and sensation are intact Psych: euthymic mood, full affect  EKG:  EKG is ordered today. The ekg ordered today demonstrates sinus rhythm,  nonspecific ST and T wave abnormality, not significantly changed from 2015  Recent Labs: 10/30/2014: ALT 10; BUN 15; Creatinine, Ser 1.09; Hemoglobin 15.6; Platelets 242.0; Potassium 4.2; Sodium 140; TSH 1.34   Lipid Panel    Component Value Date/Time   CHOL 157 10/30/2014 0822   TRIG 56.0 10/30/2014 0822   HDL 44.50 10/30/2014 0822   CHOLHDL 4 10/30/2014 0822   VLDL 11.2 10/30/2014 0822   LDLCALC  102* 10/30/2014 0822    Wt Readings from Last 3 Encounters:  11/19/14 194 lb (87.998 kg)  11/06/14 194 lb 4 oz (88.111 kg)  10/17/14 195 lb (88.451 kg)     Other studies Reviewed: Additional studies/ records that were reviewed today include: Office Notes, cath reports, previous ECGs.  ASSESSMENT AND PLAN:  1.  Preoperative evaluation: Case reviewed with Dr. Beau Fanny.   Mr. Siska is doing well from a cardiac viewpoint. He has been revascularized within the last 5 years. He has not had chest pain, any significant change in his activity level, and has no symptoms of volume overload.  Most of his limitations are respiratory.   He is at increased but acceptable risk for bronchoscopy and no further testing is indicated at this time. If additional procedures are needed, this will have to be revisited.   He is more than one year out from his drug-eluting stents, so okay to hold the Plavix as briefly as possible for the procedure.  We will arrange a follow-up appointment with Dr. [name], his first available.   2. Ischemic cardiomyopathy: Consider adding an ACE inhibitor to his medication regimen, will leave to Dr. [name]  Current medicines are reviewed at length with the patient today.  The patient does not have concerns regarding medicines.  The following changes have been made:  no change  Labs/ tests ordered today include:   Orders Placed This Encounter  Procedures  . EKG 12-Lead     Disposition:   FU with Dr. [name]  Signed, Lenoard Aden  11/19/2014 9:13 AM      [address] [place], [place], [place]  [address] Phone: [phone]; Fax: [phone]

## 2014-11-28 ENCOUNTER — Telehealth: Payer: Self-pay | Admitting: Emergency Medicine

## 2014-11-28 ENCOUNTER — Ambulatory Visit (INDEPENDENT_AMBULATORY_CARE_PROVIDER_SITE_OTHER): Payer: Medicare Other | Admitting: Emergency Medicine

## 2014-11-28 ENCOUNTER — Encounter: Payer: Self-pay | Admitting: Emergency Medicine

## 2014-11-28 VITALS — BP 148/72 | HR 79 | Ht 68.0 in | Wt 194.0 lb

## 2014-11-28 DIAGNOSIS — R911 Solitary pulmonary nodule: Secondary | ICD-10-CM

## 2014-11-28 NOTE — Assessment & Plan Note (Signed)
Hypermetabolic round somewhat cavitary right upper lobe nodule that is concerning for a slow-growing malignancy. He has been seen by cardiology and is at acceptable risk for bronchoscopy. We discussed this in detail today and I have recommended ENB under general anesthesia with fiducial placement. He agrees and we will work on scheduling this. He will need to stop his Plavix 5-7 days prior to the procedure

## 2014-11-28 NOTE — Telephone Encounter (Signed)
lmtcb for pt.   [facility] may have tried to call him regarding ENB.

## 2014-11-28 NOTE — Progress Notes (Signed)
Subjective:  Patient ID: [name], male    DOB: 25-Apr-1940, 74 y.o.   MRN: [id number] HPI 74 yo smoker (~100pk-yrs), hx of CAD/PTCI, HTN,  prostate CA. Dx with COPD around 10 yrs ago. Last seen 2007 by PW. Maintanance was Advair bid. Previously on Spiriva, stopped in 2008.   ROV 04/06/13 -- severe COPD, seen for an AE in mid December. He got better on pred, then a little more cough. Still smoking. Returns today describing cough and mucous, not as bad as the exacerbation.    ROV 06/04/13 -- severe COPD, also CAD. He reports today that he has had some vertigo that feels like spinning. Happens when he changes position. He has had this before several yrs ago. He is on advair + spiriva. He has had some episodes of dyspnea, had to use his neb x 2 last week. Otherwise doing well. He is still smoking 0.5 pk a day.   05/31/14 Acute OV : severe COPD  Presents for an acute office visit.  Complains of productive cough with pale green mucus, increased SOB with wheezing and chest tightness x 2 weeks.  Denies f/c/s, n/v, hemoptysis and edema  No recnet abx . No ER visit.  No otc used.  >>Levaquin and pred taper   Follow up : Severe COPD 06/28/14 Patient returns for a three-week follow-up. Patient was seen in the office 3 weeks ago for COPD exacerbation. He was treated with Levaquin and a prednisone taper. Patient is feeling much improved with decreased cough and wheezing. Has minimally productive with white mucus.  He does continue smoke. We discussed smoking cessation. Chest x-ray last visit. Head showed a right upper lobe nodule. Repeat chest x-ray today shows a persistent questionable pulmonary nodule in the right upper lobe. No weight loss or hemoptysis  He denies any chest pain, orthopnea, PND, or increased leg swelling  ROV 07/18/14 -- follow-up visit for severe COPD, continued tobacco use. He underwent a chest x-ray 06/28/14 that showed a right upper lobe nodule. A subsequent CT scan of the chest  performed 4/14 showed a 12 mm cavitary right upper lobe nodule without any evidence of associated lymphadenopathy. PET scan performed 07/18/14 shows malignant range hypermetabolism in the right upper lobe nodule without any clear associated hilar or mediastinal adenopathy. No TB exposure, no real cough now. No wt loss - he has actually gained some.    ROV 10/17/14 -- follow-up visit for severe COPD. He continues to smoke. We have been following a right upper lobe 12 mm nodule that is hypermetabolic on PET scan performed 07/18/14. He underwent a repeat CT chest on 10/03/14 that I have personally reviewed.  This shows an interval increase in size in his right upper lobe cavitary nodule which now measures 1.5 x 1.5 cm. He also has stable left upper lobe nodules.  He continues to work 10 days out of the month.  He continues to smoke about 3/4 a pk a day.  He is followed for his CAD by Dr Glennie Hawk, last stent was in 2012.  He reports increased cough and some change in color his sputum to a greenish yellow. No fever. Some continued wheeze  ROV 11/28/14 -- follow-up visit for tobacco use, severe COPD, an enlarging PET positive right upper lobe cavitary nodule as well as some stable left upper lobe nodules. I asked him to be seen by cardiology for screening and in preparation for a possible bronchoscopy. He was seen on 11/19/14 and It was felt that  he was stable to proceed. We discussed in detail today. Have recommended and decided to proceed. He is on plavix, will need to stop for 5-7 days.    Objective:  Physical Exam Filed Vitals:   11/28/14 0953  BP: 148/72  Pulse: 79  Height: '5\' 8"'$  (1.727 m)  Weight: 194 lb (87.998 kg)  SpO2: 97%    Gen: Pleasant, obese man, in no distress,  normal affect  ENT: No lesions,  mouth clear,  oropharynx clear, no postnasal drip  Neck: No JVD, no TMG, no carotid bruits  Lungs: No use of accessory muscles, coarse breath sounds bilaterally, expiratory  rhonchi  Cardiovascular: RRR, heart sounds normal, no murmur or gallops, no peripheral edema  Musculoskeletal: No deformities, no cyanosis or clubbing  Neuro: alert, non focal  Skin: Warm, no lesions or rashes   10/03/14 --  CLINICAL DATA: 74 year old male with history of right upper lobe nodule. Super D protocol require prior to bronchoscopy.  EXAM: CT CHEST WITHOUT CONTRAST  TECHNIQUE: Multidetector CT imaging of the chest was performed using thin slice collimation for electromagnetic bronchoscopy planning purposes, without intravenous contrast.  COMPARISON: Chest CT 07/03/2014. PET-CT 07/18/2014.  FINDINGS: Mediastinum/Lymph Nodes: Heart size is normal. There is no significant pericardial fluid, thickening or pericardial calcification. There is atherosclerosis of the thoracic aorta, the great vessels of the mediastinum and the coronary arteries, including calcified atherosclerotic plaque in the left main, left anterior descending, left circumflex and right coronary arteries. Coronary artery stents in the left anterior descending and left circumflex coronary arteries. 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 Extensive calcifications of the aortic valve. No pathologically enlarged mediastinal or hilar lymph nodes. Please note that accurate exclusion of hilar adenopathy is limited on noncontrast CT scans. Esophagus is unremarkable in appearance. No axillary lymphadenopathy.  Lungs/Pleura: In the periphery of the right upper lobe (image 131 of series 4) there is again a thick-walled cavitary lesion with some internal architecture which currently measures 1.5 x 1.5 cm, concerning for small cavitary neoplasm. 4 mm left upper lobe pulmonary  nodule (image 190 of series 4), similar prior. 5 mm left upper lobe pulmonary nodule near the apex (image 79 of series 4), similar prior. Mild diffuse bronchial wall thickening with mild centrilobular and paraseptal emphysema. Areas of bilateral apical pleuroparenchymal thickening and architectural distortion, most compatible with chronic post infectious or inflammatory areas of scarring. No acute consolidative airspace disease. No pleural effusions.  Upper Abdomen: Small calcified granuloma in the spleen. Atherosclerosis in the visualized vasculature. 13 mm short axis gastrohepatic ligament lymph node is unchanged.  Musculoskeletal/Soft Tissues: There are no aggressive appearing lytic or blastic lesions noted in the visualized portions of the skeleton.  IMPRESSION: 1. Interval growth of being a 1.5 x 1.5 cm cavitary nodule in the right upper lobe, which remains concerning for potential cavitary neoplasm. 2. Other small 4 mm and 5 mm left upper lobe pulmonary nodules are unchanged. 3. Mild diffuse bronchial wall thickening with mild centrilobular and paraseptal emphysema; imaging findings suggestive of underlying COPD. 4. Atherosclerosis, including left main and 3 vessel coronary artery disease. Please note that although the presence of coronary artery calcium documents the presence of coronary artery disease, the severity of this disease and any potential stenosis cannot be assessed on this non-gated CT examination. There is, however, evidence of prior myocardial  infarctions in the left anterior descending and left circumflex coronary artery territories, as discussed above. Assessment for potential risk factor modification, dietary therapy or pharmacologic therapy may be warranted, if clinically indicated. 5. There are calcifications of the aortic valve. Echocardiographic correlation for evaluation of potential valvular dysfunction may be warranted if clinically  indicated. 6. Additional incidental findings, similar priors, as above.   Assessment & Plan:  Lung nodule Hypermetabolic round somewhat cavitary right upper lobe nodule that is concerning for a slow-growing malignancy. He has been seen by cardiology and is at acceptable risk for bronchoscopy. We discussed this in detail today and I have recommended ENB under general anesthesia with fiducial placement. He agrees and we will work on scheduling this. He will need to stop his Plavix 5-7 days prior to the procedure

## 2014-11-28 NOTE — Patient Instructions (Signed)
We will arrange for bronchoscopy with navigational biopsies of the right upper lobe nodule.  You will need to stop your Plavix 7 days prior to this procedure You will need to stop your aspirin 2 days prior to this procedure Follow with Dr Lamonte Sakai in 1 month or sooner if you have any problems.

## 2014-11-29 NOTE — Telephone Encounter (Signed)
[facility] did you call pt?

## 2014-11-29 NOTE — Telephone Encounter (Signed)
Patient returned call, can be reached at [phone] (cell).

## 2014-11-29 NOTE — Telephone Encounter (Signed)
ENB scheduled for 12/25/14'@8'$ :30am pt aware [name]

## 2014-12-01 ENCOUNTER — Other Ambulatory Visit: Payer: Self-pay | Admitting: Family Medicine

## 2014-12-13 ENCOUNTER — Telehealth: Payer: Self-pay | Admitting: Emergency Medicine

## 2014-12-13 NOTE — Telephone Encounter (Signed)
Spoke w/ RB and Libby. Order will placed. Nothing further needed

## 2014-12-16 ENCOUNTER — Encounter (HOSPITAL_COMMUNITY)
Admission: RE | Admit: 2014-12-16 | Discharge: 2014-12-16 | Disposition: A | Discharge status: Home / Self Care | Payer: Medicare Other | Source: Ambulatory Visit | Attending: Emergency Medicine | Admitting: Emergency Medicine

## 2014-12-16 DIAGNOSIS — Z7982 Long term (current) use of aspirin: Secondary | ICD-10-CM | POA: Insufficient documentation

## 2014-12-16 DIAGNOSIS — E785 Hyperlipidemia, unspecified: Secondary | ICD-10-CM | POA: Diagnosis not present

## 2014-12-16 DIAGNOSIS — Z7902 Long term (current) use of antithrombotics/antiplatelets: Secondary | ICD-10-CM | POA: Insufficient documentation

## 2014-12-16 DIAGNOSIS — Z955 Presence of coronary angioplasty implant and graft: Secondary | ICD-10-CM | POA: Diagnosis not present

## 2014-12-16 DIAGNOSIS — I255 Ischemic cardiomyopathy: Secondary | ICD-10-CM | POA: Insufficient documentation

## 2014-12-16 DIAGNOSIS — Z01812 Encounter for preprocedural laboratory examination: Secondary | ICD-10-CM | POA: Diagnosis not present

## 2014-12-16 DIAGNOSIS — C61 Malignant neoplasm of prostate: Secondary | ICD-10-CM | POA: Diagnosis not present

## 2014-12-16 DIAGNOSIS — Z01818 Encounter for other preprocedural examination: Secondary | ICD-10-CM | POA: Insufficient documentation

## 2014-12-16 DIAGNOSIS — I739 Peripheral vascular disease, unspecified: Secondary | ICD-10-CM | POA: Diagnosis not present

## 2014-12-16 DIAGNOSIS — K219 Gastro-esophageal reflux disease without esophagitis: Secondary | ICD-10-CM | POA: Diagnosis not present

## 2014-12-16 DIAGNOSIS — I252 Old myocardial infarction: Secondary | ICD-10-CM | POA: Diagnosis not present

## 2014-12-16 DIAGNOSIS — R918 Other nonspecific abnormal finding of lung field: Secondary | ICD-10-CM | POA: Diagnosis not present

## 2014-12-16 DIAGNOSIS — I10 Essential (primary) hypertension: Secondary | ICD-10-CM | POA: Insufficient documentation

## 2014-12-16 DIAGNOSIS — Z79899 Other long term (current) drug therapy: Secondary | ICD-10-CM | POA: Insufficient documentation

## 2014-12-16 DIAGNOSIS — I2510 Atherosclerotic heart disease of native coronary artery without angina pectoris: Secondary | ICD-10-CM | POA: Diagnosis not present

## 2014-12-16 DIAGNOSIS — J449 Chronic obstructive pulmonary disease, unspecified: Secondary | ICD-10-CM | POA: Diagnosis not present

## 2014-12-16 DIAGNOSIS — Z8673 Personal history of transient ischemic attack (TIA), and cerebral infarction without residual deficits: Secondary | ICD-10-CM | POA: Insufficient documentation

## 2014-12-16 LAB — COMPREHENSIVE METABOLIC PANEL
ALK PHOS: 41 U/L (ref 38–126)
ALT: 14 U/L — AB (ref 17–63)
AST: 20 U/L (ref 15–41)
Albumin: 4.1 g/dL (ref 3.5–5.0)
Anion gap: 8 (ref 5–15)
BUN: 17 mg/dL (ref 6–20)
CALCIUM: 9.6 mg/dL (ref 8.9–10.3)
CO2: 28 mmol/L (ref 22–32)
CREATININE: 1.07 mg/dL (ref 0.61–1.24)
Chloride: 104 mmol/L (ref 101–111)
GFR calc non Af Amer: >60 mL/min (ref >60)
GLUCOSE: 110 mg/dL — AB (ref 65–99)
Potassium: 3.9 mmol/L (ref 3.5–5.1)
SODIUM: 140 mmol/L (ref 135–145)
Total Bilirubin: 0.6 mg/dL (ref 0.3–1.2)
Total Protein: 7.6 g/dL (ref 6.5–8.1)

## 2014-12-16 LAB — CBC
HCT: 45.7 % (ref 39.0–52.0)
HEMOGLOBIN: 16.1 g/dL (ref 13.0–17.0)
MCH: 34.3 pg — AB (ref 26.0–34.0)
MCHC: 35.2 g/dL (ref 30.0–36.0)
MCV: 97.2 fL (ref 78.0–100.0)
Platelets: 207 10*3/uL (ref 150–400)
RBC: 4.7 MIL/uL (ref 4.22–5.81)
RDW: 13.2 % (ref 11.5–15.5)
WBC: 8 10*3/uL (ref 4.0–10.5)

## 2014-12-16 LAB — PROTIME-INR
INR: 1.05 (ref 0.00–1.49)
Prothrombin Time: 13.9 seconds (ref 11.6–15.2)

## 2014-12-16 LAB — APTT: aPTT: 39 seconds — ABNORMAL HIGH (ref 24–37)

## 2014-12-16 NOTE — Progress Notes (Signed)
This patient scored at an elevated risk for obstructive sleep apnea using the stop bang tool during a presurgical testing

## 2014-12-16 NOTE — Progress Notes (Signed)
Pt states he was instructed to stop aspirin 2 days prior to surgery and plavix 6 days prior to surgery.  Pt advised to follow instructions given as noted above.

## 2014-12-16 NOTE — Pre-Procedure Instructions (Signed)
    [name]  12/16/2014      [name]   Your procedure is scheduled on December 25, 2014.  [facility]  Call this number if you have problems the morning of surgery:  [phone]   Remember:  Do not eat food or drink liquids after midnight.  Take these medicines the morning of surgery with A SIP OF WATER : metoprolol tartrate (LOPRESSOR), pravastatin  (PRAVACHOL), SPIRIVA HANDIHALER , Tamsulosin HCl (FLOMAX), Fluticasone-Salmeterol (ADVAIR DISKUS)   If needed: albuterol (VENTOLIN HFA)- BRING WITH YOU, ALPRAZolam (XANAX), nitroGLYCERIN (NITROSTAT)   STOP PLAVIX 5 DAYS PRIOR TO SURGERY.  LAST DOSE SHOULD BE 12/19/2014   ASPIRIN AS DIRECTED    Do not wear jewelry.  Do not wear lotions, powders, or colognes.  You may wear deodorant.  Men may shave face and neck.  Do not bring valuables to the hospital.  [facility] belongings or [facility]ables.  Contacts, dentures or bridgework may not be worn into surgery.  Leave your suitcase in the car.  After surgery it may be brought to your room.  For patients admitted to the hospital, discharge time will be determined by your treatment team.  Patients discharged the day of surgery will not be allowed to drive home.   Name and phone number of your driver:    Special instructions:   "PREPARING FOR SURGERY"  Please read over the following fact sheets that you were given. Pain Booklet, Coughing and Deep Breathing and Surgical Site Infection Prevention

## 2014-12-17 NOTE — Progress Notes (Signed)
Anesthesia Chart Review:  Pt is 74 year old male scheduled for video bronchoscopy with endobronchial navigation on 12/25/2014 with Dr. Lamonte Sakai.   Cardiologist is Dr. [name], last office visit 11/19/14 with Rosaria Ferries, PA.   PMH includes: CAD (MI 1997 w/ BMS x 2 LAD; BMS CFX 2002, DES x 2 LAD 2009; DES CFX & OM 2012), HTN, PVD, carotid artery disease, hyperlipidemia, CHF, PACs, stroke (2013), ischemic cardiomyopathy, COPD, GERD, prostate cancer. Current smoker. BMI 29. S/p L CEA 11/24/11.   Medications include: albuterol, ASA, plavix, advair, duoneb, metoprolol, pravastatin, spiriva.   Preoperative labs reviewed.  PTT 39, PT normal.   Chest CT 10/03/2014:  1. Interval growth of being a 1.5 x 1.5 cm cavitary nodule in the right upper lobe, which remains concerning for potential cavitary neoplasm. 2. Other small 4 mm and 5 mm left upper lobe pulmonary nodules are unchanged. 3. Mild diffuse bronchial wall thickening with mild centrilobular and paraseptal emphysema; imaging findings suggestive of underlying COPD. 4. Atherosclerosis, including left main and 3 vessel coronary artery disease. Please note that although the presence of coronary artery calcium documents the presence of coronary artery disease, the severity of this disease and any potential stenosis cannot be assessed on this non-gated CT examination. There is, however, evidence of prior myocardial infarctions in the left anterior descending and left circumflex coronary artery territories, as discussed above. Assessment for potential risk factor modification, dietary therapy or pharmacologic therapy may be warranted, if clinically indicated. 5. There are calcifications of the aortic valve. Echocardiographic correlation for evaluation of potential valvular dysfunction may be warranted if clinically indicated.  EKG 11/19/2014: NSR. Nonspecific ST and T wave abnormality.   Carotid duplex US 12/20/2013:  -Widely patent B CEA  without evidence of restenosis or hyperplasia.   Cardiac cath 04/14/2010:  -LM normal. LAD with diffuse luminal irregularities. There was mid stent, which was patent with diffuse luminal in-stent irregularities. There was long mid 30% stenosis. Distal stent was widely patent. D1 was small and ostial 70% stenosis. D2 was small and normal.  Circumflex in the AV groove had diffuse luminal irregularities. Proximal AV groove had a focal 30% stenosis. The distal AV groove before small posterolaterals and a PDA had a focal 99% stenosis. There was a mid obtuse marginal, which was very large. There was a mid stent in the marginal which was widely patent. There was ostial 80-90% stenosis. The right coronary artery is dominant. There was small 99% stenosis. -Left Ventriculogram: Left ventriculogram was obtained in the RAO projection. The EF of 60% with inferior hypokinesis. -Successful percutaneous coronary intervention with placement of a drug-eluting stent in the atrioventricular groove circumflex artery and placement of a drug-eluting stent in the first obtuse marginal coronary artery.  Pt has cardiac clearance from Rosaria Ferries, PA in Epic note dated 11/19/14. If further procedures are planned, Ms. Ahmed Prima notes pt may need further testing to evaluate cardiac risk.   If no changes, I anticipate pt can proceed with surgery as scheduled.   Willeen Cass, FNP-BC [facility]/Anesthesiology Phone: [phone] 12/17/2014 4:09 PM

## 2014-12-24 ENCOUNTER — Encounter (HOSPITAL_COMMUNITY): Payer: Self-pay

## 2014-12-24 ENCOUNTER — Ambulatory Visit: Payer: Medicare Other | Admitting: Family

## 2014-12-24 ENCOUNTER — Ambulatory Visit: Payer: Self-pay | Admitting: Family

## 2014-12-24 ENCOUNTER — Other Ambulatory Visit (HOSPITAL_COMMUNITY): Payer: Medicare Other

## 2014-12-25 ENCOUNTER — Ambulatory Visit (HOSPITAL_COMMUNITY): Payer: Medicare Other

## 2014-12-25 ENCOUNTER — Encounter (HOSPITAL_COMMUNITY): Payer: Self-pay | Admitting: *Deleted

## 2014-12-25 ENCOUNTER — Ambulatory Visit (HOSPITAL_COMMUNITY): Payer: Medicare Other | Admitting: Emergency Medicine

## 2014-12-25 ENCOUNTER — Ambulatory Visit (HOSPITAL_COMMUNITY): Payer: Medicare Other | Admitting: Certified Registered"

## 2014-12-25 ENCOUNTER — Ambulatory Visit (HOSPITAL_COMMUNITY)
Admission: RE | Admit: 2014-12-25 | Discharge: 2014-12-25 | Disposition: A | Discharge status: Home / Self Care | Payer: Medicare Other | Source: Ambulatory Visit | Attending: Emergency Medicine | Admitting: Emergency Medicine

## 2014-12-25 ENCOUNTER — Encounter (HOSPITAL_COMMUNITY)
Admission: RE | Disposition: A | Discharge status: Home / Self Care | Payer: Self-pay | Source: Ambulatory Visit | Attending: Emergency Medicine

## 2014-12-25 DIAGNOSIS — I10 Essential (primary) hypertension: Secondary | ICD-10-CM | POA: Diagnosis not present

## 2014-12-25 DIAGNOSIS — F17200 Nicotine dependence, unspecified, uncomplicated: Secondary | ICD-10-CM | POA: Insufficient documentation

## 2014-12-25 DIAGNOSIS — I509 Heart failure, unspecified: Secondary | ICD-10-CM | POA: Diagnosis not present

## 2014-12-25 DIAGNOSIS — Z8546 Personal history of malignant neoplasm of prostate: Secondary | ICD-10-CM | POA: Diagnosis not present

## 2014-12-25 DIAGNOSIS — Z9889 Other specified postprocedural states: Secondary | ICD-10-CM | POA: Insufficient documentation

## 2014-12-25 DIAGNOSIS — R911 Solitary pulmonary nodule: Secondary | ICD-10-CM | POA: Diagnosis not present

## 2014-12-25 DIAGNOSIS — I2510 Atherosclerotic heart disease of native coronary artery without angina pectoris: Secondary | ICD-10-CM | POA: Insufficient documentation

## 2014-12-25 DIAGNOSIS — C3411 Malignant neoplasm of upper lobe, right bronchus or lung: Secondary | ICD-10-CM | POA: Diagnosis not present

## 2014-12-25 DIAGNOSIS — Z955 Presence of coronary angioplasty implant and graft: Secondary | ICD-10-CM | POA: Insufficient documentation

## 2014-12-25 DIAGNOSIS — I739 Peripheral vascular disease, unspecified: Secondary | ICD-10-CM | POA: Diagnosis not present

## 2014-12-25 DIAGNOSIS — Z419 Encounter for procedure for purposes other than remedying health state, unspecified: Secondary | ICD-10-CM

## 2014-12-25 DIAGNOSIS — J449 Chronic obstructive pulmonary disease, unspecified: Secondary | ICD-10-CM | POA: Insufficient documentation

## 2014-12-25 HISTORY — PX: FUDUCIAL PLACEMENT: SHX5083

## 2014-12-25 HISTORY — PX: VIDEO BRONCHOSCOPY WITH ENDOBRONCHIAL NAVIGATION: SHX6175

## 2014-12-25 SURGERY — VIDEO BRONCHOSCOPY WITH ENDOBRONCHIAL NAVIGATION
Anesthesia: General | Site: Chest

## 2014-12-25 MED ORDER — SUCCINYLCHOLINE CHLORIDE 20 MG/ML IJ SOLN
INTRAMUSCULAR | Status: AC
Start: 2014-12-25 — End: 2014-12-25
  Filled 2014-12-25: qty 1

## 2014-12-25 MED ORDER — SODIUM CHLORIDE 0.9 % IJ SOLN
INTRAMUSCULAR | Status: AC
  Filled 2014-12-25: qty 10

## 2014-12-25 MED ORDER — EPINEPHRINE HCL 1 MG/ML IJ SOLN
INTRAMUSCULAR | Status: AC
Start: 2014-12-25 — End: 2014-12-25
  Filled 2014-12-25: qty 1

## 2014-12-25 MED ORDER — GLYCOPYRROLATE 0.2 MG/ML IJ SOLN
INTRAMUSCULAR | Status: DC | PRN
  Administered 2014-12-25: 0.4 mg via INTRAVENOUS

## 2014-12-25 MED ORDER — LACTATED RINGERS IV SOLN
INTRAVENOUS | Status: DC | PRN
  Administered 2014-12-25 (×2): via INTRAVENOUS

## 2014-12-25 MED ORDER — PHENYLEPHRINE HCL 10 MG/ML IJ SOLN
10.0000 mg | INTRAVENOUS | Status: DC | PRN
  Administered 2014-12-25: 20 ug/min via INTRAVENOUS

## 2014-12-25 MED ORDER — LIDOCAINE HCL (CARDIAC) 20 MG/ML IV SOLN
INTRAVENOUS | Status: AC
  Filled 2014-12-25: qty 5

## 2014-12-25 MED ORDER — ROCURONIUM BROMIDE 50 MG/5ML IV SOLN
INTRAVENOUS | Status: AC
  Filled 2014-12-25: qty 1

## 2014-12-25 MED ORDER — ROCURONIUM BROMIDE 100 MG/10ML IV SOLN
INTRAVENOUS | Status: DC | PRN
  Administered 2014-12-25: 10 mg via INTRAVENOUS
  Administered 2014-12-25: 40 mg via INTRAVENOUS

## 2014-12-25 MED ORDER — ONDANSETRON HCL 4 MG/2ML IJ SOLN
INTRAMUSCULAR | Status: DC | PRN
  Administered 2014-12-25: 4 mg via INTRAVENOUS

## 2014-12-25 MED ORDER — PHENYLEPHRINE 40 MCG/ML (10ML) SYRINGE FOR IV PUSH (FOR BLOOD PRESSURE SUPPORT)
PREFILLED_SYRINGE | INTRAVENOUS | Status: AC
  Filled 2014-12-25: qty 10

## 2014-12-25 MED ORDER — PROPOFOL 10 MG/ML IV BOLUS
INTRAVENOUS | Status: AC
  Filled 2014-12-25: qty 20

## 2014-12-25 MED ORDER — EPHEDRINE SULFATE 50 MG/ML IJ SOLN
INTRAMUSCULAR | Status: AC
  Filled 2014-12-25: qty 1

## 2014-12-25 MED ORDER — NEOSTIGMINE METHYLSULFATE 10 MG/10ML IV SOLN
INTRAVENOUS | Status: DC | PRN
  Administered 2014-12-25: 3 mg via INTRAVENOUS

## 2014-12-25 MED ORDER — FENTANYL CITRATE (PF) 250 MCG/5ML IJ SOLN
INTRAMUSCULAR | Status: DC | PRN
  Administered 2014-12-25 (×3): 50 ug via INTRAVENOUS

## 2014-12-25 MED ORDER — DEXAMETHASONE SODIUM PHOSPHATE 4 MG/ML IJ SOLN
INTRAMUSCULAR | Status: DC | PRN
Start: 2014-12-25 — End: 2014-12-25
  Administered 2014-12-25: 8 mg via INTRAVENOUS

## 2014-12-25 MED ORDER — 0.9 % SODIUM CHLORIDE (POUR BTL) OPTIME
TOPICAL | Status: DC | PRN
  Administered 2014-12-25: 1000 mL

## 2014-12-25 MED ORDER — CLOPIDOGREL BISULFATE 75 MG PO TABS: 75.0000 mg | ORAL_TABLET | Freq: Every day | ORAL | Status: DC

## 2014-12-25 MED ORDER — FENTANYL CITRATE (PF) 250 MCG/5ML IJ SOLN
INTRAMUSCULAR | Status: AC
  Filled 2014-12-25: qty 5

## 2014-12-25 MED ORDER — LIDOCAINE HCL (CARDIAC) 20 MG/ML IV SOLN
INTRAVENOUS | Status: DC | PRN
  Administered 2014-12-25: 60 mg via INTRATRACHEAL
  Administered 2014-12-25: 60 mg via INTRAVENOUS

## 2014-12-25 MED ORDER — DEXAMETHASONE SODIUM PHOSPHATE 4 MG/ML IJ SOLN
INTRAMUSCULAR | Status: AC
  Filled 2014-12-25: qty 2

## 2014-12-25 MED ORDER — PROPOFOL 10 MG/ML IV BOLUS
INTRAVENOUS | Status: DC | PRN
  Administered 2014-12-25: 20 mg via INTRAVENOUS
  Administered 2014-12-25: 40 mg via INTRAVENOUS
  Administered 2014-12-25: 20 mg via INTRAVENOUS
  Administered 2014-12-25: 100 mg via INTRAVENOUS

## 2014-12-25 SURGICAL SUPPLY — 35 items
BRUSH BIOPSY BRONCH 10 SDTNB (MISCELLANEOUS) ×2 IMPLANT
BRUSH BIOPSY BRONCH 10MM SDTNB (MISCELLANEOUS) ×1
BRUSH CYTOL CELLEBRITY 1.5X140 (MISCELLANEOUS) IMPLANT
BRUSH SUPERTRAX BIOPSY (INSTRUMENTS) IMPLANT
BRUSH SUPERTRAX NDL-TIP CYTO (INSTRUMENTS) ×9 IMPLANT
CANISTER SUCTION 2500CC (MISCELLANEOUS) ×3 IMPLANT
CHANNEL WORK EXTEND EDGE 180 (KITS) IMPLANT
CHANNEL WORK EXTEND EDGE 45 (KITS) IMPLANT
CHANNEL WORK EXTEND EDGE 90 (KITS) IMPLANT
CONT SPEC 4OZ CLIKSEAL STRL BL (MISCELLANEOUS) ×6 IMPLANT
COVER TABLE BACK 60X90 (DRAPES) ×3 IMPLANT
FILTER STRAW FLUID ASPIR (MISCELLANEOUS) IMPLANT
FORCEPS BIOP SUPERTRX PREMAR (INSTRUMENTS) ×6 IMPLANT
GAUZE SPONGE 4X4 12PLY STRL (GAUZE/BANDAGES/DRESSINGS) ×3 IMPLANT
GLOVE BIO SURGEON STRL SZ7.5 (GLOVE) ×6 IMPLANT
KIT CLEAN ENDO COMPLIANCE (KITS) ×3 IMPLANT
KIT LOCATABLE GUIDE (CANNULA) IMPLANT
KIT MARKER FIDUCIAL DELIVERY (KITS) ×3 IMPLANT
KIT PROCEDURE EDGE 180 (KITS) ×6 IMPLANT
KIT PROCEDURE EDGE 45 (KITS) IMPLANT
KIT PROCEDURE EDGE 90 (KITS) IMPLANT
KIT ROOM TURNOVER OR (KITS) ×3 IMPLANT
MARKER FIDUCIAL SL NIT COIL (Implant Marker) ×9 IMPLANT
MARKER SKIN DUAL TIP RULER LAB (MISCELLANEOUS) ×3 IMPLANT
NS IRRIG 1000ML POUR BTL (IV SOLUTION) ×3 IMPLANT
OIL SILICONE PENTAX (PARTS (SERVICE/REPAIRS)) ×3 IMPLANT
PAD ARMBOARD 7.5X6 YLW CONV (MISCELLANEOUS) ×6 IMPLANT
PATCHES PATIENT (LABEL) ×3 IMPLANT
SYR 20CC LL (SYRINGE) ×3 IMPLANT
SYR 20ML ECCENTRIC (SYRINGE) ×3 IMPLANT
SYR 50ML SLIP (SYRINGE) ×3 IMPLANT
TOWEL OR 17X24 6PK STRL BLUE (TOWEL DISPOSABLE) ×3 IMPLANT
TRAP SPECIMEN MUCOUS 40CC (MISCELLANEOUS) IMPLANT
TUBE CONNECTING 20'X1/4 (TUBING) ×1
TUBE CONNECTING 20X1/4 (TUBING) ×2 IMPLANT

## 2014-12-25 NOTE — Anesthesia Postprocedure Evaluation (Signed)
  Anesthesia Post-op Note  Patient: [name]  Procedure(s) Performed: Procedure(s): VIDEO BRONCHOSCOPY WITH ENDOBRONCHIAL NAVIGATION  with Biopsies and Brushings (N/A) PLACEMENT OF FUDUCIAL (N/A)  Patient Location: PACU  Anesthesia Type:General  Level of Consciousness: awake and alert   Airway and Oxygen Therapy: Patient Spontanous Breathing  Post-op Pain: mild  Post-op Assessment: Post-op Vital signs reviewed              Post-op Vital Signs: stable  Last Vitals:  Filed Vitals:   12/25/14 1216  BP: 127/55  Pulse: 65  Temp:   Resp: 16    Complications: No apparent anesthesia complications

## 2014-12-25 NOTE — Transfer of Care (Signed)
Immediate Anesthesia Transfer of Care Note  Patient: [name]  Procedure(s) Performed: Procedure(s): VIDEO BRONCHOSCOPY WITH ENDOBRONCHIAL NAVIGATION  with Biopsies and Brushings (N/A) PLACEMENT OF FUDUCIAL (N/A)  Patient Location: PACU  Anesthesia Type:General  Level of Consciousness: awake, alert  and oriented  Airway & Oxygen Therapy: Patient connected to face mask oxygen  Post-op Assessment: Report given to RN  Post vital signs: stable  Last Vitals:  Filed Vitals:   12/25/14 1120  BP:   Pulse:   Temp: 37 C  Resp:     Complications: No apparent anesthesia complications

## 2014-12-25 NOTE — Anesthesia Procedure Notes (Signed)
Procedure Name: Intubation Date/Time: 12/25/2014 9:21 AM Performed by: Julian Reil Pre-anesthesia Checklist: Patient identified, Emergency Drugs available, Suction available and Patient being monitored Patient Re-evaluated:Patient Re-evaluated prior to inductionOxygen Delivery Method: Circle system utilized Preoxygenation: Pre-oxygenation with 100% oxygen Intubation Type: IV induction Ventilation: Mask ventilation without difficulty and Oral airway inserted - appropriate to patient size Laryngoscope Size: Mac and 4 Grade View: Grade II Tube type: Oral Tube size: 8.5 mm Number of attempts: 1 Airway Equipment and Method: Stylet and LTA kit utilized Placement Confirmation: ETT inserted through vocal cords under direct vision,  positive ETCO2 and breath sounds checked- equal and bilateral Secured at: 23 cm Tube secured with: Tape Dental Injury: Teeth and Oropharynx as per pre-operative assessment

## 2014-12-25 NOTE — Anesthesia Preprocedure Evaluation (Addendum)
Anesthesia Evaluation  Patient identified by MRN, date of birth, ID band Patient awake    Reviewed: Allergy & Precautions, NPO status   Airway Mallampati: II  TM Distance: <3 FB Neck ROM: Full    Dental  (+) Edentulous Upper, Edentulous Lower   Pulmonary COPD, Current Smoker,    + rhonchi        Cardiovascular hypertension, + CAD, + Past MI, + Peripheral Vascular Disease and +CHF   Rhythm:Regular Rate:Normal  Cardiac clearance eval noted   Neuro/Psych    GI/Hepatic GERD  ,  Endo/Other    Renal/GU      Musculoskeletal  (+) Arthritis ,   Abdominal   Peds  Hematology   Anesthesia Other Findings   Reproductive/Obstetrics                            Anesthesia Physical Anesthesia Plan  ASA: IV  Anesthesia Plan: General   Post-op Pain Management:    Induction: Intravenous  Airway Management Planned: Oral ETT  Additional Equipment:   Intra-op Plan:   Post-operative Plan: Extubation in OR  Informed Consent: I have reviewed the patients History and Physical, chart, labs and discussed the procedure including the risks, benefits and alternatives for the proposed anesthesia with the patient or authorized representative who has indicated his/her understanding and acceptance.   Dental advisory given  Plan Discussed with: CRNA and Surgeon  Anesthesia Plan Comments: (Increased risk anesthesia and surgery)        Anesthesia Quick Evaluation

## 2014-12-25 NOTE — Interval H&P Note (Signed)
PCCM Interval   Pt presents today for further eval of his RUL cavitary nodule. No new issues reported. He understands the procedure, risks, benefits.   Filed Vitals:   12/25/14 0628  BP: 142/66  Pulse: 71  Temp: 98.6 F (37 C)  TempSrc: Oral  Resp: 20  Height: '5\' 8"'$  (1.727 m)  Weight: 87.544 kg (193 lb)  SpO2: 98%    Gen: Pleasant, well-nourished, in no distress,  normal affect  ENT: No lesions,  mouth clear,  oropharynx clear, no postnasal drip  Neck: No JVD, no TMG, no carotid bruits  Lungs: No use of accessory muscles, very soft exp wheeze, especially  on the R  Cardiovascular: RRR, heart sounds normal, no murmur or gallops, no peripheral edema  Musculoskeletal: No deformities, no cyanosis or clubbing  Neuro: alert, non focal  Skin: Warm, no lesions or rashes   PLans:  ENB for biopsies today.   Baltazar Apo, MD, PhD 12/25/2014, 9:04 AM [facility] [phone] or if no answer [phone]

## 2014-12-25 NOTE — Discharge Instructions (Signed)
Flexible Bronchoscopy, Care After These instructions give you information on caring for yourself after your procedure. Your doctor may also give you more specific instructions. Call your doctor if you have any problems or questions after your procedure. HOME CARE  Do not eat or drink anything for 2 hours after your procedure. If you try to eat or drink before the medicine wears off, food or drink could go into your lungs. You could also burn yourself.  After 2 hours have passed and when you can cough and gag normally, you may eat soft food and drink liquids slowly.  The day after the test, you may eat your normal diet.  You may do your normal activities.  Keep all doctor visits. GET HELP RIGHT AWAY IF:  You get more and more short of breath.  You get light-headed.  You feel like you are going to pass out (faint).  You have chest pain.  You have new problems that worry you.  You cough up more than a little blood.  You cough up more blood than before. MAKE SURE YOU:  Understand these instructions.  Will watch your condition.  Will get help right away if you are not doing well or get worse.   This information is not intended to replace advice given to you by your health care provider. Make sure you discuss any questions you have with your health care provider.   Document Released: 01/03/2009 Document Revised: 03/13/2013 Document Reviewed: 11/10/2012 Elsevier Interactive Patient Education 2016 [employer].  Please do not restart your Plavix until 12/27/14.  Please call our office for any questions or concerns. [phone].

## 2014-12-25 NOTE — Op Note (Addendum)
Video Bronchoscopy with Electromagnetic Navigation Procedure Note  Date of Operation: 12/25/2014  Pre-op Diagnosis: right upper lobe nodule  Post-op Diagnosis: right upper lobe nodule  Surgeon: Baltazar Apo  Assistants: none  Anesthesia: General endotracheal anesthesia  Operation: Flexible video fiberoptic bronchoscopy with electromagnetic navigation and biopsies.  Estimated Blood Loss: Minimal  Complications: none apparent  Indications and History: [name] is a 74 y.o. male with history of tobacco use with a slowly enlarging right upper lobe nodule noted on CT scan of the chest. Recommend patient was made to achieve a tissue diagnosis via bronchoscopy.  The risks, benefits, complications, treatment options and expected outcomes were discussed with the patient.  The possibilities of pneumothorax, pneumonia, reaction to medication, pulmonary aspiration, perforation of a viscus, bleeding, failure to diagnose a condition and creating a complication requiring transfusion or operation were discussed with the patient who freely signed the consent.    Description of Procedure: The patient was seen in the Preoperative Area, was examined and was deemed appropriate to proceed.  The patient was taken to OR 10, identified as [name] and the procedure verified as Flexible Video Fiberoptic Bronchoscopy.  A Time Out was held and the above information confirmed.   Prior to the date of the procedure a high-resolution CT scan of the chest was performed. Utilizing [employer] a virtual tracheobronchial tree was generated to allow the creation of distinct navigation pathways to the patient's RUL nodular abnormality. After being taken to the operating room general anesthesia was initiated and the patient  was orally intubated. The video fiberoptic bronchoscope was introduced via the endotracheal tube and a general inspection was performed which showed normal airways throughout with  thick white secretions in all airways that were easily suctioned away. There were no endobronchial lesions seen. The extendable working channel and locator guide were introduced into the bronchoscope. The distinct navigation pathways prepared prior to this procedure were then utilized to navigate to within 0.5-1.0cm of patient's lesion identified on CT scan. The extendable working channel was secured into place and the locator guide was withdrawn. Under fluoroscopic guidance transbronchial needle brushings, transbronchial Wang needle biopsies, and transbronchial forceps biopsies were performed to be sent for cytology and pathology.  Based on suspicion for possible non-small cell lung cancer, 3 Gold fiducial markers were placed triangulating the lesion to facilitate potential stereotactic radiation at some point in the future. At the end of the procedure a general airway inspection was performed and there was no evidence of active bleeding. The bronchoscope was removed.  The patient tolerated the procedure well. There was no significant blood loss and there were no obvious complications. A post-procedural chest x-ray is pending.  Samples: 1. Transbronchial needle brushings from right upper lobe nodule 2. Transbronchial Wang needle biopsies from right upper lobe nodule 3. Transbronchial forceps biopsies from right upper lobe nodule  Plans:  The patient will be discharged from the PACU to home when recovered from anesthesia and after chest x-ray is reviewed. We will review the cytology, pathology results with the patient when they become available. Outpatient followup will be with Dr Lamonte Sakai.    Baltazar Apo, MD, PhD 12/25/2014, 11:11 AM [facility] [phone] or if no answer [phone]

## 2014-12-25 NOTE — H&P (View-Only) (Signed)
Subjective:  Patient ID: [name], male    DOB: 27-Dec-1940, 73 y.o.   MRN: [id number] HPI 74 yo smoker (~100pk-yrs), hx of CAD/PTCI, HTN,  prostate CA. Dx with COPD around 10 yrs ago. Last seen 2007 by PW. Maintanance was Advair bid. Previously on Spiriva, stopped in 2008.   ROV 04/06/13 -- severe COPD, seen for an AE in mid December. He got better on pred, then a little more cough. Still smoking. Returns today describing cough and mucous, not as bad as the exacerbation.    ROV 06/04/13 -- severe COPD, also CAD. He reports today that he has had some vertigo that feels like spinning. Happens when he changes position. He has had this before several yrs ago. He is on advair + spiriva. He has had some episodes of dyspnea, had to use his neb x 2 last week. Otherwise doing well. He is still smoking 0.5 pk a day.   05/31/14 Acute OV : severe COPD  Presents for an acute office visit.  Complains of productive cough with pale green mucus, increased SOB with wheezing and chest tightness x 2 weeks.  Denies f/c/s, n/v, hemoptysis and edema  No recnet abx . No ER visit.  No otc used.  >>Levaquin and pred taper   Follow up : Severe COPD 06/28/14 Patient returns for a three-week follow-up. Patient was seen in the office 3 weeks ago for COPD exacerbation. He was treated with Levaquin and a prednisone taper. Patient is feeling much improved with decreased cough and wheezing. Has minimally productive with white mucus.  He does continue smoke. We discussed smoking cessation. Chest x-ray last visit. Head showed a right upper lobe nodule. Repeat chest x-ray today shows a persistent questionable pulmonary nodule in the right upper lobe. No weight loss or hemoptysis  He denies any chest pain, orthopnea, PND, or increased leg swelling  ROV 07/18/14 -- follow-up visit for severe COPD, continued tobacco use. He underwent a chest x-ray 06/28/14 that showed a right upper lobe nodule. A subsequent CT scan of the chest  performed 4/14 showed a 12 mm cavitary right upper lobe nodule without any evidence of associated lymphadenopathy. PET scan performed 07/18/14 shows malignant range hypermetabolism in the right upper lobe nodule without any clear associated hilar or mediastinal adenopathy. No TB exposure, no real cough now. No wt loss - he has actually gained some.    ROV 10/17/14 -- follow-up visit for severe COPD. He continues to smoke. We have been following a right upper lobe 12 mm nodule that is hypermetabolic on PET scan performed 07/18/14. He underwent a repeat CT chest on 10/03/14 that I have personally reviewed.  This shows an interval increase in size in his right upper lobe cavitary nodule which now measures 1.5 x 1.5 cm. He also has stable left upper lobe nodules.  He continues to work 10 days out of the month.  He continues to smoke about 3/4 a pk a day.  He is followed for his CAD by Dr Glennie Hawk, last stent was in 2012.  He reports increased cough and some change in color his sputum to a greenish yellow. No fever. Some continued wheeze  ROV 11/28/14 -- follow-up visit for tobacco use, severe COPD, an enlarging PET positive right upper lobe cavitary nodule as well as some stable left upper lobe nodules. I asked him to be seen by cardiology for screening and in preparation for a possible bronchoscopy. He was seen on 11/19/14 and It was felt that  he was stable to proceed. We discussed in detail today. Have recommended and decided to proceed. He is on plavix, will need to stop for 5-7 days.    Objective:  Physical Exam Filed Vitals:   11/28/14 0953  BP: 148/72  Pulse: 79  Height: '5\' 8"'$  (1.727 m)  Weight: 194 lb (87.998 kg)  SpO2: 97%    Gen: Pleasant, obese man, in no distress,  normal affect  ENT: No lesions,  mouth clear,  oropharynx clear, no postnasal drip  Neck: No JVD, no TMG, no carotid bruits  Lungs: No use of accessory muscles, coarse breath sounds bilaterally, expiratory  rhonchi  Cardiovascular: RRR, heart sounds normal, no murmur or gallops, no peripheral edema  Musculoskeletal: No deformities, no cyanosis or clubbing  Neuro: alert, non focal  Skin: Warm, no lesions or rashes   10/03/14 --  CLINICAL DATA: 74 year old male with history of right upper lobe nodule. Super D protocol require prior to bronchoscopy.  EXAM: CT CHEST WITHOUT CONTRAST  TECHNIQUE: Multidetector CT imaging of the chest was performed using thin slice collimation for electromagnetic bronchoscopy planning purposes, without intravenous contrast.  COMPARISON: Chest CT 07/03/2014. PET-CT 07/18/2014.  FINDINGS: Mediastinum/Lymph Nodes: Heart size is normal. There is no significant pericardial fluid, thickening or pericardial calcification. There is atherosclerosis of the thoracic aorta, the great vessels of the mediastinum and the coronary arteries, including calcified atherosclerotic plaque in the left main, left anterior descending, left circumflex and right coronary arteries. Coronary artery stents in the left anterior descending and left circumflex coronary arteries. 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 Extensive calcifications of the aortic valve. No pathologically enlarged mediastinal or hilar lymph nodes. Please note that accurate exclusion of hilar adenopathy is limited on noncontrast CT scans. Esophagus is unremarkable in appearance. No axillary lymphadenopathy.  Lungs/Pleura: In the periphery of the right upper lobe (image 131 of series 4) there is again a thick-walled cavitary lesion with some internal architecture which currently measures 1.5 x 1.5 cm, concerning for small cavitary neoplasm. 4 mm left upper lobe pulmonary  nodule (image 190 of series 4), similar prior. 5 mm left upper lobe pulmonary nodule near the apex (image 79 of series 4), similar prior. Mild diffuse bronchial wall thickening with mild centrilobular and paraseptal emphysema. Areas of bilateral apical pleuroparenchymal thickening and architectural distortion, most compatible with chronic post infectious or inflammatory areas of scarring. No acute consolidative airspace disease. No pleural effusions.  Upper Abdomen: Small calcified granuloma in the spleen. Atherosclerosis in the visualized vasculature. 13 mm short axis gastrohepatic ligament lymph node is unchanged.  Musculoskeletal/Soft Tissues: There are no aggressive appearing lytic or blastic lesions noted in the visualized portions of the skeleton.  IMPRESSION: 1. Interval growth of being a 1.5 x 1.5 cm cavitary nodule in the right upper lobe, which remains concerning for potential cavitary neoplasm. 2. Other small 4 mm and 5 mm left upper lobe pulmonary nodules are unchanged. 3. Mild diffuse bronchial wall thickening with mild centrilobular and paraseptal emphysema; imaging findings suggestive of underlying COPD. 4. Atherosclerosis, including left main and 3 vessel coronary artery disease. Please note that although the presence of coronary artery calcium documents the presence of coronary artery disease, the severity of this disease and any potential stenosis cannot be assessed on this non-gated CT examination. There is, however, evidence of prior myocardial  infarctions in the left anterior descending and left circumflex coronary artery territories, as discussed above. Assessment for potential risk factor modification, dietary therapy or pharmacologic therapy may be warranted, if clinically indicated. 5. There are calcifications of the aortic valve. Echocardiographic correlation for evaluation of potential valvular dysfunction may be warranted if clinically  indicated. 6. Additional incidental findings, similar priors, as above.   Assessment & Plan:  Lung nodule Hypermetabolic round somewhat cavitary right upper lobe nodule that is concerning for a slow-growing malignancy. He has been seen by cardiology and is at acceptable risk for bronchoscopy. We discussed this in detail today and I have recommended ENB under general anesthesia with fiducial placement. He agrees and we will work on scheduling this. He will need to stop his Plavix 5-7 days prior to the procedure

## 2014-12-26 ENCOUNTER — Encounter (HOSPITAL_COMMUNITY): Payer: Self-pay | Admitting: Emergency Medicine

## 2014-12-27 ENCOUNTER — Telehealth: Payer: Self-pay | Admitting: Emergency Medicine

## 2014-12-27 DIAGNOSIS — C3491 Malignant neoplasm of unspecified part of right bronchus or lung: Secondary | ICD-10-CM

## 2014-12-27 NOTE — Telephone Encounter (Signed)
Discussed bx results with pt > shows NSCLCA, suspicious for squamous cell. We discussed the options. I do not believe he would tolerate lobectomy. He has indicated to me that he does not want to pursue chemotherapy, although he is willing to talk to oncology about the option. I suspect he is best suited for SBRT. I placed fiducial markers to help facilitate. I will refer him to [place] to plan next steps.   Baltazar Apo, MD, PhD 12/27/2014, 2:35 PM [facility] [phone] or if no answer [phone]

## 2014-12-30 ENCOUNTER — Telehealth: Payer: Self-pay | Admitting: *Deleted

## 2014-12-30 ENCOUNTER — Ambulatory Visit: Payer: Self-pay | Admitting: Family

## 2014-12-30 NOTE — Telephone Encounter (Signed)
Oncology Nurse Navigator Documentation  Oncology Nurse Navigator Flowsheets 12/30/2014  Referral date to RadOnc/MedOnc 12/30/2014  Navigator Encounter Type Introductory phone call/recieved referral on [name] today. I have scheduled him to be seen at [facility] on 01/02/15 arrive at 2:45.  He verbalized understanding of appt time and place.   Patient Visit Type Initial  Treatment Phase Abnormal Scans  Interventions Coordination of Care  Coordination of Care MD Appointments  Time Spent with Patient 15

## 2014-12-31 ENCOUNTER — Telehealth: Payer: Self-pay | Admitting: Emergency Medicine

## 2014-12-31 NOTE — Telephone Encounter (Signed)
Form will be signed once RB returns to office on 01/02/15.

## 2014-12-31 NOTE — Telephone Encounter (Signed)
RB pt. Form placed in RB's box. Will forward to [place] to look out for.

## 2015-01-01 ENCOUNTER — Telehealth: Payer: Self-pay | Admitting: *Deleted

## 2015-01-01 NOTE — Telephone Encounter (Signed)
Called pt and confirmed 01/02/15 clinic appt w/ pt.

## 2015-01-02 ENCOUNTER — Ambulatory Visit
Admission: RE | Admit: 2015-01-02 | Discharge: 2015-01-02 | Disposition: A | Discharge status: Home / Self Care | Payer: Medicare Other | Source: Ambulatory Visit | Attending: Radiation Oncology | Admitting: Radiation Oncology

## 2015-01-02 ENCOUNTER — Encounter: Payer: Self-pay | Admitting: *Deleted

## 2015-01-02 ENCOUNTER — Ambulatory Visit: Payer: Medicare Other | Attending: Radiation Oncology | Admitting: Physical Therapy

## 2015-01-02 ENCOUNTER — Encounter: Payer: Medicare Other | Admitting: Cardiothoracic Surgery

## 2015-01-02 DIAGNOSIS — R293 Abnormal posture: Secondary | ICD-10-CM | POA: Diagnosis not present

## 2015-01-02 DIAGNOSIS — C3411 Malignant neoplasm of upper lobe, right bronchus or lung: Secondary | ICD-10-CM | POA: Diagnosis not present

## 2015-01-02 DIAGNOSIS — R5381 Other malaise: Secondary | ICD-10-CM | POA: Diagnosis not present

## 2015-01-02 NOTE — Therapy (Signed)
[place], [place], [address] Phone: [phone]   Fax:  [phone]  Physical Therapy Evaluation  Patient Details  Name: [name] MRN: [id number] Date of Birth: 1940-04-26 No Data Recorded  Encounter Date: 01/02/2015      PT End of Session - 01/02/15 1956    Visit Number 1   Number of Visits 1   PT Start Time 5053   PT Stop Time 1540   PT Time Calculation (min) 25 min   Activity Tolerance Patient tolerated treatment well   Behavior During Therapy [facility] for tasks assessed/performed      Past Medical History  Diagnosis Date  . COPD (chronic obstructive pulmonary disease) ([place])   . CAD (coronary artery disease)     AMI 1997 w/ BMS x 2 LAD, BMS CFX 2002, DES x 2 LAD 2009, DES CFX & OM 2012   . HTN (hypertension)   . Hyperlipidemia   . Osteoarthritis   . Peripheral vascular disease ([place])   . Prostate cancer ([place])   . Carotid artery disease ([place])     RICA CEA 9767, LICA CEA 3419 per Dr. [name],   . Myocardial infarction ([place])   . GERD (gastroesophageal reflux disease)     occ  . CHF (congestive heart failure) ([place])     pt denied in 2014  . Anxiety   . PAC (premature atrial contraction)   . Pneumonia   . Hydradenitis   . Stroke ([place]) 11-24-11  . Ischemic cardiomyopathy     EF 40-45 percent at cath 2012    Past Surgical History  Procedure Laterality Date  . Coronary stent placement  1997, 2002, 2009, 2012    7 stents total  . Doppler echocardiography  1995    neg  . Colonoscopy  04-2001    polyp  . Cardiovascular stress test  12-2006  . Cardiac catheterization    . Vascular surgery    . Endarterectomy  11/24/2011    Procedure: ENDARTERECTOMY CAROTID;  Surgeon: Rosetta Posner, MD;  Location: [place];  Service: Vascular;  Laterality: Left;  Marland Kitchen Eye surgery Left Aug. 17, 2015    Cataract  . Carotid endarterectomy Right 03-2000    CE  . Carotid endarterectomy Left 11-24-11    CE  .  Prostate surgery      Radiation Tx  X's 40  . Video bronchoscopy with endobronchial navigation N/A 12/25/2014    Procedure: VIDEO BRONCHOSCOPY WITH ENDOBRONCHIAL NAVIGATION  with Biopsies and Brushings;  Surgeon: Collene Gobble, MD;  Location: [place];  Service: Thoracic;  Laterality: N/A;  . Fuducial placement N/A 12/25/2014    Procedure: PLACEMENT OF FUDUCIAL;  Surgeon: Collene Gobble, MD;  Location: [place];  Service: Thoracic;  Laterality: N/A;    There were no vitals filed for this visit.  Visit Diagnosis:  Physical deconditioning - Plan: PT plan of care cert/re-cert  Posture abnormality - Plan: PT plan of care cert/re-cert      Subjective Assessment - 01/02/15 1941    Subjective no complaints today   Pertinent History Patient presented with a COPD exacerbation; work up chest x-ray 06/28/14 showed a right upper lobe nodule.  Workup to date indicates right upper lobe microscopic focus of malignant cells consistent with non-small cell cardcinoma.  Patient has an area on esophagus that lights up on PET for which he will likely have endoscopy for further workup.  At this time, patient is expected to be  treated with radiation therapy.  Current smoker; h/o prostate cancer; CAD with 8 cardiac stents; h/o MI, CHF, CVA.                                                                   Patient Stated Goals get information from all lung clinic providers   Currently in Pain? No/denies            [facility] PT Assessment - 01/02/15 0001    Assessment   Medical Diagnosis probable stage I lung cancer   Onset Date/Surgical Date 06/28/14   Precautions   Precautions Other (comment)   Precaution Comments cancer precautions   Restrictions   Weight Bearing Restrictions No   Balance Screen   Has the patient fallen in the past 6 months No   Has the patient had a decrease in activity level because of a fear of falling?  No   Is the patient reluctant to leave their home because of a fear of falling?  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 unaware of this   ROM / Strength   AROM / PROM / Strength AROM   AROM   Overall AROM Comments In standing:  trunk flexion--reaches fingertips 15 inches to floor; trunk extension 25% loss; others [facility]   Ambulation/Gait   Ambulation/Gait Yes   Ambulation/Gait Assistance 7: Independent  reports SOB at times   Balance   Balance Assessed Yes   Dynamic Standing Balance   Dynamic Standing - Comments reaches forward 8 inches in standing, below average for age                           PT Education - 01/02/15 1955    Education provided Yes   Education Details posture, breathing, energy conservation, walking, Cure article on staying active, PT info   Person(s) Educated Patient;Child(ren)   Methods Explanation;Handout   Comprehension Verbalized understanding               Lung Clinic Goals - 01/02/15 2000    Patient will be able to verbalize understanding of the benefit of exercise to decrease fatigue.   Status Achieved   Patient will be able to verbalize the importance of posture.   Status Achieved   Patient will be able to demonstrate diaphragmatic breathing for improved lung function.   Status Achieved   Patient  will be able to verbalize understanding of the role of physical therapy to prevent functional decline and  who to contact if physical therapy is needed.   Status Achieved             Plan - 2015/01/23 1956    Clinical Impression Statement 74 y.o. male with significant medical history including cardiac problems and prostate cancer now with malignancy consistent with non-small cell lung cancer.  He has a right lateral shift in standing as well as exertional shortness of breath.  He has mild trunk stiffness.   Pt will benefit from skilled therapeutic intervention in order to improve on the following deficits Cardiopulmonary status limiting activity;Postural dysfunction   Rehab Potential Good   PT Frequency One time visit   PT Treatment/Interventions Patient/family education   PT Next Visit Plan None at this time.   PT Home Exercise Plan see education section   Consulted and Agree with Plan of Care Patient          G-Codes - 2015/01/23 Jun 22, 1999    Functional Assessment Tool Used clinical judgement   Functional Limitation Changing and maintaining body position   Changing and Maintaining Body Position Current Status [phone]) At least 1 percent but less than 20 percent impaired, limited or restricted   Changing and Maintaining Body Position Goal Status (L8921) At least 1 percent but less than 20 percent impaired, limited or restricted   Changing and Maintaining Body Position Discharge Status (J9417) At least 1 percent but less than 20 percent impaired, limited or restricted       Problem List Patient Active Problem List   Diagnosis Date Noted  . 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 S/P bronchoscopy with biopsy   . Colon cancer screening 11/06/2014  . Encounter for Medicare annual wellness exam 11/06/2014  . Routine general medical examination at a health care facility 11/06/2014  . Acute bronchitis 10/17/2014  . Lung nodule 06/28/2014  . Carotid stenosis 12/20/2013  .  Aftercare following surgery of the circulatory system 12/20/2013  . Aftercare following surgery of the circulatory system, [place] 12/19/2012  . Diarrhea 08/28/2012  . Personal history of colonic polyps 08/28/2012  . Hyperglycemia 08/07/2012  . Boils 04/03/2012  . Intertrigo 04/03/2012  . Pre-operative cardiovascular examination 11/03/2011  . Carotid artery disease ([place]) 05/20/2011  . Grief reaction 05/07/2011  . Ischemic cardiomyopathy 01/13/2011  . Palpitations 11/10/2010  . Occlusion and stenosis of carotid artery without mention of cerebral infarction 04/02/2010  . CHEST PAIN 04/02/2010  . ANXIETY 12/12/2009  . HYPERTENSION, BENIGN 09/29/2008  . History of prostate cancer 09/19/2008  . CHRONIC OBSTRUCTIVE PULMONARY DISEASE, ACUTE EXACERBATION 06/19/2007  . Hyperlipidemia 02/10/2007  . AMAUROSIS FUGAX 02/10/2007  . MYOCARDIAL INFARCTION, HX OF 02/10/2007  . Coronary atherosclerosis 02/10/2007  . PERIPHERAL VASCULAR DISEASE 02/10/2007  . HEMORRHOIDS 02/10/2007  . EMPHYSEMA 02/10/2007  . Chronic obstructive pulmonary disease ([place]) 02/10/2007  . OSTEOARTHRITIS 02/10/2007  . Nicotine dependence 02/10/2007  . PURE HYPERCHOLESTEROLEMIA 01/27/2007  . CORONARY ATHEROSCLEROSIS NATIVE CORONARY ARTERY 01/27/2007    SALISBURY,DONNA 23-Jan-2015, 8:05 PM  [place], [place], [address] Phone: [phone]   Fax:  [phone]  Name: [name] MRN: [id number] Date of Birth: 10-11-1940   Serafina Royals, PT 01/23/15 8:05 PM

## 2015-01-02 NOTE — Progress Notes (Signed)
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 male  With a history of prostate cancer treated with radiation 6 years ago who presented with a lung nodule found on chest xray. The lesion was seen on CT in April of this year and a PET scan at that time showed the nodule had an SUV of 6.4. Also seen was an eara of uptake in the distal esophagus and direct visualization was recommended. No evidence of metastatic disease or lymphadenopathy was noted at that time.  He underwent bronchoscopy and biopsy on 10/5 which showed NSCLC favoring squamous cell carcinoma. Dr. Lamonte Sakai did not feel he was a good surgical candidate due to his age and COPD.  He was referred for discussion of SBRT int he management of this newly diagnosed lung cancer. He is a current smoker. He does not want surgery or chemotherapy. He is accompanied by his daughter. 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 HE still smokes a half pack of cigarettes a day. He is gaining weight. His breathing symptoms are stable. He does have heartburn and takes Tums every night.   PREVIOUS RADIATION THERAPY: No  Past medical, social and family history were reviewed in the electronic chart. Review of symptoms was reviewed in the electronic chart. Medications were reviewed in the electronic chart.   PHYSICAL EXAM: There were no vitals filed for this visit.. . Pleasant elderly male in no distress. Normal respiratory effort.   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 We discussed that he may fail in the mediastinum hilum or elsewhere in the body and radiation was a local only process. We discussed the process of simulation and the use of a pad all to decrease respiratory motion. We discussed the use of 4-dimensional simulation to minimize normal lung tissue treated and the use of respiratory compression. We discussed 3- 5 treatments occurring every other day as an outpatient. We discussed these treatments will last about 10-20 minutes and he would be here at the hospital for about an hour. We discussed that SBRT was unlikely to make his breathing any worse. It is also unlikely to make his breathing symptoms any better. We discussed possible side effects including his shoulder pain due to arm positioning and possible rib fracture withthe pleural-based nodules proximity to the ribs. We discussed damage to other critical normal structures including heart, ribs, lung collapse, chronic cough, and brachial plexus injury. We discussed that without treatment this  could develop into a more aggressive or even metastatic cancer.   I would like to repeat his PET scan since it has been 6 months and ensure he does not have metastatic disease. This will also follow up on his esophageal activity. I will then schedule him for simulation.   I spent 40 minutes face to face with the patient and more than 50% of that time was spent in  counseling and/or coordination of care.    I spent 40 minutes  face to face with the patient and more than 50% of that time was spent in counseling and/or coordination of care.   ------------------------------------------------  Thea Silversmith, MD

## 2015-01-02 NOTE — Progress Notes (Signed)
[place] Clinical Social Work  Clinical Social Work met with patient/family at [employer] appointment to offer support and assess for psychosocial needs.  Patient was accompanied by his daughter.  Patient lives by himself in [place].  He shared his only concern at this time is finances and whether treatments will be covered by insurance.  The patient presented a cancer policy and CSW reviewed policy with patient.    Clinical Social Work briefly discussed Clinical Social Work role and [employer] support programs/services.  Clinical Social Work encouraged patient to call with any additional questions or concerns.   Polo Riley, MSW, LCSW, OSW-C Clinical Social Worker [facility] [phone]

## 2015-01-03 ENCOUNTER — Telehealth: Payer: Self-pay | Admitting: *Deleted

## 2015-01-03 NOTE — Telephone Encounter (Signed)
This form has not been completed yet. It has been given to RB to complete.

## 2015-01-03 NOTE — Telephone Encounter (Signed)
Form has been completed and signed. Pt is aware. He has upcoming appointment next week. Form will be picked up then. I will hold on to this form until then.

## 2015-01-03 NOTE — Telephone Encounter (Signed)
Ria Comment, please advise if this was signed. Thanks.

## 2015-01-03 NOTE — Telephone Encounter (Signed)
CALLED PATIENT TO INFORM OF PET AND SIM, LVM FOR A RETURN CALL

## 2015-01-07 ENCOUNTER — Encounter: Payer: Self-pay | Admitting: Emergency Medicine

## 2015-01-07 ENCOUNTER — Telehealth: Payer: Self-pay | Admitting: *Deleted

## 2015-01-07 ENCOUNTER — Ambulatory Visit (INDEPENDENT_AMBULATORY_CARE_PROVIDER_SITE_OTHER): Payer: Medicare Other | Admitting: Emergency Medicine

## 2015-01-07 VITALS — BP 128/72 | HR 81

## 2015-01-07 DIAGNOSIS — J449 Chronic obstructive pulmonary disease, unspecified: Secondary | ICD-10-CM

## 2015-01-07 DIAGNOSIS — C3411 Malignant neoplasm of upper lobe, right bronchus or lung: Secondary | ICD-10-CM | POA: Diagnosis not present

## 2015-01-07 NOTE — Patient Instructions (Addendum)
Please continue your inhaled medications as you are taking them  Work hard on decreasing your cigarettes.  Follow with Dr Pablo Ledger as planned.  Get your PET scan as planned.  Get flu shot this Fall Follow with Dr Lamonte Sakai in 3 months or sooner if you have any problems.

## 2015-01-07 NOTE — Progress Notes (Signed)
Subjective:  Patient ID: [name], male    DOB: [birth year], 74 y.o.   MRN: [id number] HPI 74 yo smoker (~100pk-yrs), hx of CAD/PTCI, HTN,  prostate CA. Dx with COPD around 10 yrs ago. Last seen 2007 by PW. Maintanance was Advair bid. Previously on Spiriva, stopped in 2008.   ROV 04/06/13 -- severe COPD, seen for an AE in mid December. He got better on pred, then a little more cough. Still smoking. Returns today describing cough and mucous, not as bad as the exacerbation.    ROV 06/04/13 -- severe COPD, also CAD. He reports today that he has had some vertigo that feels like spinning. Happens when he changes position. He has had this before several yrs ago. He is on advair + spiriva. He has had some episodes of dyspnea, had to use his neb x 2 last week. Otherwise doing well. He is still smoking 0.5 pk a day.   05/31/14 Acute OV : severe COPD  Presents for an acute office visit.  Complains of productive cough with pale green mucus, increased SOB with wheezing and chest tightness x 2 weeks.  Denies f/c/s, n/v, hemoptysis and edema  No recnet abx . No ER visit.  No otc used.  >>Levaquin and pred taper   Follow up : Severe COPD 06/28/14 Patient returns for a three-week follow-up. Patient was seen in the office 3 weeks ago for COPD exacerbation. He was treated with Levaquin and a prednisone taper. Patient is feeling much improved with decreased cough and wheezing. Has minimally productive with white mucus.  He does continue smoke. We discussed smoking cessation. Chest x-ray last visit. Head showed a right upper lobe nodule. Repeat chest x-ray today shows a persistent questionable pulmonary nodule in the right upper lobe. No weight loss or hemoptysis  He denies any chest pain, orthopnea, PND, or increased leg swelling  ROV 07/18/14 -- follow-up visit for severe COPD, continued tobacco use. He underwent a chest x-ray 06/28/14 that showed a right upper lobe nodule. A subsequent CT scan of the chest  performed 4/14 showed a 12 mm cavitary right upper lobe nodule without any evidence of associated lymphadenopathy. PET scan performed 07/18/14 shows malignant range hypermetabolism in the right upper lobe nodule without any clear associated hilar or mediastinal adenopathy. No TB exposure, no real cough now. No wt loss - he has actually gained some.    ROV 10/17/14 -- follow-up visit for severe COPD. He continues to smoke. We have been following a right upper lobe 12 mm nodule that is hypermetabolic on PET scan performed 07/18/14. He underwent a repeat CT chest on 10/03/14 that I have personally reviewed.  This shows an interval increase in size in his right upper lobe cavitary nodule which now measures 1.5 x 1.5 cm. He also has stable left upper lobe nodules.  He continues to work 10 days out of the month.  He continues to smoke about 3/4 a pk a day.  He is followed for his CAD by Dr Glennie Hawk, last stent was in 2012.  He reports increased cough and some change in color his sputum to a greenish yellow. No fever. Some continued wheeze  ROV 11/28/14 -- follow-up visit for tobacco use, severe COPD, an enlarging PET positive right upper lobe cavitary nodule as well as some stable left upper lobe nodules. I asked him to be seen by cardiology for screening and in preparation for a possible bronchoscopy. He was seen on 11/19/14 and It was felt that  he was stable to proceed. We discussed in detail today. Have recommended and decided to proceed. He is on plavix, will need to stop for 5-7 days.   ROV 01/07/15 -- follow-up visit for tobacco use, severe COPD and abnormal CT scan of the chest. 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 He has been seen by Dr. Pablo Ledger in radiation oncology regarding SBRT. He is scheduled for PET on 10/24, simulation on 10/26. He is back on plavix.    Objective:  Physical Exam Filed Vitals:    01/07/15 0941  BP: 128/72  Pulse: 81  SpO2: 98%    Gen: Pleasant, obese man, in no distress,  normal affect  ENT: No lesions,  mouth clear,  oropharynx clear, no postnasal drip  Neck: No JVD, no TMG, no carotid bruits  Lungs: No use of accessory muscles, coarse breath sounds bilaterally, expiratory rhonchi  Cardiovascular: RRR, heart sounds normal, no murmur or gallops, no peripheral edema  Musculoskeletal: No deformities, no cyanosis or clubbing  Neuro: alert, non focal  Skin: Warm, no lesions or rashes   10/03/14 --  CLINICAL DATA: 74 year old male with history of right upper lobe nodule. Super D protocol require prior to bronchoscopy.  EXAM: CT CHEST WITHOUT CONTRAST  TECHNIQUE: Multidetector CT imaging of the chest was performed using thin slice collimation for electromagnetic bronchoscopy planning purposes, without intravenous contrast.  COMPARISON: Chest CT 07/03/2014. PET-CT 07/18/2014.  FINDINGS: Mediastinum/Lymph Nodes: Heart size is normal. There is no significant pericardial fluid, thickening or pericardial calcification. There is atherosclerosis of the thoracic aorta, the great vessels of the mediastinum and the coronary arteries, including calcified atherosclerotic plaque in the left main, left anterior descending, left circumflex and right coronary arteries. Coronary artery stents in the left anterior descending and left circumflex coronary arteries. 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 Extensive calcifications of the aortic valve. No pathologically enlarged mediastinal or hilar lymph nodes. Please note that accurate exclusion of hilar adenopathy is limited on noncontrast CT scans. Esophagus is unremarkable in  appearance. No axillary lymphadenopathy.  Lungs/Pleura: In the periphery of the right upper lobe (image 131 of series 4) there is again a thick-walled cavitary lesion with some internal architecture which currently measures 1.5 x 1.5 cm, concerning for small cavitary neoplasm. 4 mm left upper lobe pulmonary nodule (image 190 of series 4), similar prior. 5 mm left upper lobe pulmonary nodule near the apex (image 79 of series 4), similar prior. Mild diffuse bronchial wall thickening with mild centrilobular and paraseptal emphysema. Areas of bilateral apical pleuroparenchymal thickening and architectural distortion, most compatible with chronic post infectious or inflammatory areas of scarring. No acute consolidative airspace disease. No pleural effusions.  Upper Abdomen: Small calcified granuloma in the spleen. Atherosclerosis in the visualized vasculature. 13 mm short axis gastrohepatic ligament lymph node is unchanged.  Musculoskeletal/Soft Tissues: There are no aggressive appearing lytic or blastic lesions noted in the visualized portions of the skeleton.  IMPRESSION: 1. Interval growth of being a 1.5 x 1.5 cm cavitary nodule in the right upper lobe, which remains concerning for potential cavitary neoplasm. 2. Other small 4 mm and 5 mm left upper lobe pulmonary nodules are unchanged. 3. Mild diffuse bronchial wall thickening with mild centrilobular and paraseptal emphysema; imaging findings suggestive of  underlying COPD. 4. Atherosclerosis, including left main and 3 vessel coronary artery disease. Please note that although the presence of coronary artery calcium documents the presence of coronary artery disease, the severity of this disease and any potential stenosis cannot be assessed on this non-gated CT examination. There is, however, evidence of prior myocardial infarctions in the left anterior descending and left circumflex coronary artery territories, as discussed  above. Assessment for potential risk factor modification, dietary therapy or pharmacologic therapy may be warranted, if clinically indicated. 5. There are calcifications of the aortic valve. Echocardiographic correlation for evaluation of potential valvular dysfunction may be warranted if clinically indicated. 6. Additional incidental findings, similar priors, as above.   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 PET scan scheduled for 01/13/15 and simulation scheduled for 01/15/15.   Chronic obstructive pulmonary disease Severe disease, continues to smoke. We discussed cessation in detail. He will continue to try to cut down slowly - next goal will be getting down to 7 cigarettes daily. Continue Spiriva and Symbicort as he is taking them. Follow in 3 months

## 2015-01-07 NOTE — Telephone Encounter (Signed)
Oncology Nurse Navigator Documentation  Oncology Nurse Navigator Flowsheets 01/07/2015  Navigator Encounter Type Telephone/called to follow up from thoracic clinic on 01/02/15.  I left vm message to call if needed with my name and phone number  Patient Visit Type Follow-up  Treatment Phase Abnormal Scans  Interventions -  Coordination of Care -  Time Spent with Patient 15

## 2015-01-07 NOTE — Assessment & Plan Note (Signed)
Following with radiation oncology and planning for SBRT. PET scan scheduled for 01/13/15 and simulation scheduled for 01/15/15.

## 2015-01-07 NOTE — Assessment & Plan Note (Signed)
Severe disease, continues to smoke. We discussed cessation in detail. He will continue to try to cut down slowly - next goal will be getting down to 7 cigarettes daily. Continue Spiriva and Symbicort as he is taking them. Follow in 3 months

## 2015-01-13 ENCOUNTER — Other Ambulatory Visit: Payer: Self-pay | Admitting: *Deleted

## 2015-01-13 ENCOUNTER — Ambulatory Visit (HOSPITAL_COMMUNITY)
Admission: RE | Admit: 2015-01-13 | Discharge: 2015-01-13 | Disposition: A | Discharge status: Home / Self Care | Payer: Medicare Other | Source: Ambulatory Visit | Attending: Radiation Oncology | Admitting: Radiation Oncology

## 2015-01-13 ENCOUNTER — Other Ambulatory Visit: Payer: Self-pay | Admitting: Emergency Medicine

## 2015-01-13 DIAGNOSIS — J439 Emphysema, unspecified: Secondary | ICD-10-CM | POA: Insufficient documentation

## 2015-01-13 DIAGNOSIS — I714 Abdominal aortic aneurysm, without rupture: Secondary | ICD-10-CM | POA: Diagnosis not present

## 2015-01-13 DIAGNOSIS — C3411 Malignant neoplasm of upper lobe, right bronchus or lung: Secondary | ICD-10-CM

## 2015-01-13 DIAGNOSIS — N281 Cyst of kidney, acquired: Secondary | ICD-10-CM | POA: Insufficient documentation

## 2015-01-13 LAB — GLUCOSE, CAPILLARY: Glucose-Capillary: 111 mg/dL — ABNORMAL HIGH (ref 65–99)

## 2015-01-13 MED ORDER — FLUDEOXYGLUCOSE F - 18 (FDG) INJECTION
9.5000 | Freq: Once | INTRAVENOUS | Status: DC | PRN
  Administered 2015-01-13: 9.5 via INTRAVENOUS
  Filled 2015-01-13: qty 9.5

## 2015-01-13 MED ORDER — LORATADINE 10 MG PO TABS: ORAL_TABLET | ORAL | Status: DC

## 2015-01-14 ENCOUNTER — Telehealth: Payer: Self-pay | Admitting: Emergency Medicine

## 2015-01-14 DIAGNOSIS — R590 Localized enlarged lymph nodes: Secondary | ICD-10-CM

## 2015-01-14 NOTE — Telephone Encounter (Signed)
Called spoke w/ pt. He reports Dr. Pablo Ledger was going to contact [facility] this afternoon about getting a BX set up on the lymph nodes in chest per pt. He reports he was told he would be called today by our office w/ an appt. Please advise RB thanks

## 2015-01-14 NOTE — Telephone Encounter (Signed)
Called made pt aware and he needed nothing further

## 2015-01-14 NOTE — Telephone Encounter (Signed)
Let him know that we are working on scheduling it and will call him when we have OR time.

## 2015-01-15 ENCOUNTER — Telehealth: Payer: Self-pay | Admitting: Emergency Medicine

## 2015-01-15 ENCOUNTER — Ambulatory Visit: Payer: Medicare Other | Admitting: Radiation Oncology

## 2015-01-15 NOTE — Telephone Encounter (Signed)
Will route message to RB as he is the one who has to enter these orders.

## 2015-01-15 NOTE — Telephone Encounter (Signed)
Spoke with pt. He had a question about when to stop his Plavix and ASA prior to his EBUS. Per RB >> Plavix stop 5 days prior, ASA stop 2 day prior. Pt is aware of this information. Nothing further was needed.

## 2015-01-15 NOTE — Telephone Encounter (Signed)
Done

## 2015-01-16 NOTE — Pre-Procedure Instructions (Signed)
    [name]  01/16/2015      [id number] [id number]: [phone]: [phone]   Your procedure is scheduled on Wednesday, November 2nd, 2016.  [facility]  Call this number if you have problems the morning of surgery:  [phone]   Remember:  Do not eat food or drink liquids after midnight.   Take these medicines the morning of surgery with A SIP OF WATER: Albuterol inhaler (please bring with you), Alprazolam (Xanax) if needed, Fluticasone-Salmeterol (Advair), Duoneb if needed, Loratadine (claritin), Metoprolol Tartrate (Lopressor), Spiriva.  Follow MD's instructions on Plavix and Aspirin.    Plavix - stop taking 5 days prior to surgery.    Aspirin - stop taking 2 days prior to surgery  Stop taking: NSAIDS, Aleve, Naproxen, Advil, Motrin, Ibuprofen, BC's, Goody's, Fish oil, all herbal medications, and all vitamins.    Do not wear jewelry.  Do not wear lotions, powders, or colognes.  You may wear deodorant.  Men may shave face and neck.  Do not bring valuables to the hospital.  [facility] belongings or [facility]ables.  Contacts, dentures or bridgework may not be worn into surgery.  Leave your suitcase in the car.  After surgery it may be brought to your room.  For patients admitted to the hospital, discharge time will be determined by your treatment team.  Patients discharged the day of surgery will not be allowed to drive home.   Special instructions:  See attached.   Please read over the following fact sheets that you were given. Pain Booklet, Coughing and Deep Breathing and Surgical Site Infection Prevention

## 2015-01-17 ENCOUNTER — Encounter (HOSPITAL_COMMUNITY)
Admission: RE | Admit: 2015-01-17 | Discharge: 2015-01-17 | Disposition: A | Discharge status: Home / Self Care | Payer: Medicare Other | Source: Ambulatory Visit | Attending: Emergency Medicine | Admitting: Emergency Medicine

## 2015-01-17 ENCOUNTER — Encounter (HOSPITAL_COMMUNITY): Payer: Self-pay

## 2015-01-17 DIAGNOSIS — Z01812 Encounter for preprocedural laboratory examination: Secondary | ICD-10-CM | POA: Diagnosis not present

## 2015-01-17 DIAGNOSIS — Z8673 Personal history of transient ischemic attack (TIA), and cerebral infarction without residual deficits: Secondary | ICD-10-CM | POA: Diagnosis not present

## 2015-01-17 DIAGNOSIS — F17200 Nicotine dependence, unspecified, uncomplicated: Secondary | ICD-10-CM | POA: Insufficient documentation

## 2015-01-17 DIAGNOSIS — E785 Hyperlipidemia, unspecified: Secondary | ICD-10-CM | POA: Diagnosis not present

## 2015-01-17 DIAGNOSIS — I509 Heart failure, unspecified: Secondary | ICD-10-CM | POA: Diagnosis not present

## 2015-01-17 DIAGNOSIS — Z7902 Long term (current) use of antithrombotics/antiplatelets: Secondary | ICD-10-CM | POA: Diagnosis not present

## 2015-01-17 DIAGNOSIS — I2510 Atherosclerotic heart disease of native coronary artery without angina pectoris: Secondary | ICD-10-CM | POA: Insufficient documentation

## 2015-01-17 DIAGNOSIS — Z923 Personal history of irradiation: Secondary | ICD-10-CM | POA: Insufficient documentation

## 2015-01-17 DIAGNOSIS — I252 Old myocardial infarction: Secondary | ICD-10-CM | POA: Diagnosis not present

## 2015-01-17 DIAGNOSIS — R911 Solitary pulmonary nodule: Secondary | ICD-10-CM | POA: Insufficient documentation

## 2015-01-17 DIAGNOSIS — I714 Abdominal aortic aneurysm, without rupture: Secondary | ICD-10-CM | POA: Diagnosis not present

## 2015-01-17 DIAGNOSIS — Z8546 Personal history of malignant neoplasm of prostate: Secondary | ICD-10-CM | POA: Diagnosis not present

## 2015-01-17 DIAGNOSIS — Z955 Presence of coronary angioplasty implant and graft: Secondary | ICD-10-CM | POA: Insufficient documentation

## 2015-01-17 DIAGNOSIS — J449 Chronic obstructive pulmonary disease, unspecified: Secondary | ICD-10-CM | POA: Diagnosis not present

## 2015-01-17 DIAGNOSIS — Z7982 Long term (current) use of aspirin: Secondary | ICD-10-CM | POA: Diagnosis not present

## 2015-01-17 DIAGNOSIS — I255 Ischemic cardiomyopathy: Secondary | ICD-10-CM | POA: Insufficient documentation

## 2015-01-17 DIAGNOSIS — K219 Gastro-esophageal reflux disease without esophagitis: Secondary | ICD-10-CM | POA: Diagnosis not present

## 2015-01-17 DIAGNOSIS — Z79899 Other long term (current) drug therapy: Secondary | ICD-10-CM | POA: Insufficient documentation

## 2015-01-17 DIAGNOSIS — I110 Hypertensive heart disease with heart failure: Secondary | ICD-10-CM | POA: Diagnosis not present

## 2015-01-17 DIAGNOSIS — Z01818 Encounter for other preprocedural examination: Secondary | ICD-10-CM | POA: Diagnosis not present

## 2015-01-17 HISTORY — DX: Abdominal aortic aneurysm, without rupture: I71.4

## 2015-01-17 HISTORY — DX: Frequency of micturition: R35.0

## 2015-01-17 HISTORY — DX: Reserved for inherently not codable concepts without codable children: IMO0001

## 2015-01-17 HISTORY — DX: Abdominal aortic aneurysm, without rupture, unspecified: I71.40

## 2015-01-17 HISTORY — DX: Urgency of urination: R39.15

## 2015-01-17 LAB — CBC
HCT: 41.9 % (ref 39.0–52.0)
Hemoglobin: 14 g/dL (ref 13.0–17.0)
MCH: 32.3 pg (ref 26.0–34.0)
MCHC: 33.4 g/dL (ref 30.0–36.0)
MCV: 96.8 fL (ref 78.0–100.0)
PLATELETS: 271 10*3/uL (ref 150–400)
RBC: 4.33 MIL/uL (ref 4.22–5.81)
RDW: 12.8 % (ref 11.5–15.5)
WBC: 6.9 10*3/uL (ref 4.0–10.5)

## 2015-01-17 LAB — BASIC METABOLIC PANEL
Anion gap: 9 (ref 5–15)
BUN: 11 mg/dL (ref 6–20)
CHLORIDE: 98 mmol/L — AB (ref 101–111)
CO2: 26 mmol/L (ref 22–32)
CREATININE: 1.02 mg/dL (ref 0.61–1.24)
Calcium: 9.2 mg/dL (ref 8.9–10.3)
GFR calc Af Amer: >60 mL/min (ref >60)
GFR calc non Af Amer: >60 mL/min (ref >60)
GLUCOSE: 103 mg/dL — AB (ref 65–99)
Potassium: 3.8 mmol/L (ref 3.5–5.1)
SODIUM: 133 mmol/L — AB (ref 135–145)

## 2015-01-17 LAB — PROTIME-INR
INR: 1.09 (ref 0.00–1.49)
Prothrombin Time: 14.3 seconds (ref 11.6–15.2)

## 2015-01-17 NOTE — Progress Notes (Signed)
   01/17/15 0931  OBSTRUCTIVE SLEEP APNEA  Have you ever been diagnosed with sleep apnea through a sleep study? No  Do you snore loudly (loud enough to be heard through closed doors)?  0  Do you often feel tired, fatigued, or sleepy during the daytime (such as falling asleep during driving or talking to someone)? 1  Has anyone observed you stop breathing during your sleep? 0  Do you have, or are you being treated for high blood pressure? 1  BMI more than 35 kg/m2? 0  Age > 50 (1-yes) 1  Neck circumference greater than:Male 16 inches or larger, Male 17inches or larger? 1  Male Gender (Yes=1) 1  Obstructive Sleep Apnea Score 5

## 2015-01-17 NOTE — Progress Notes (Signed)
PCP - Dr. Loura Pardon Cardiologist - Dr. [name]  EKG- 11/19/14 - Epic CXR - 1 view - 12/25/14  Cardiac Cath - 08/2007  Pt. States that he has 8 stents and last stent was placed 04/14/2010.    Patient denies shortness of breath and chest pain at PAT appointment.  Patient states that he will stop his Plavix 5 days prior to procedure and stop Aspirin 2 days prior.

## 2015-01-20 ENCOUNTER — Encounter (HOSPITAL_COMMUNITY): Payer: Self-pay

## 2015-01-20 NOTE — Progress Notes (Signed)
Anesthesia Chart Review: Pt is 74 year old male scheduled for bronchoscopy with endobronchial ultrasound and biopsies on 01/22/15 by Dr. Lamonte Sakai. He is s/p video bronchoscopy with endobronchial navigation for RUL lung nodule on 12/25/2014. Pathology showed NSCLC favoring SCC. Dr. Lamonte Sakai did not think he would tolerate a lobectomy, and patient preferred to avoid chemotherapy but was willing to discuss further with [facility]. He was also referred to Dr. Pablo Ledger with [facility] for consideration of SBRT. 01/13/15 PET showed mildly hypermetabolic right paratracheal and right hilar LN of indeterminate significance. Biopsies recommended to evaluate for metastatic disease.  Other PMH includes: CAD (MI 1997 w/ BMS x 2 LAD; BMS CFX 2002, DES x 2 LAD 2009; DES CFX & OM 04/14/10), CHF, ischemic cardiomyopathy (EF 60% 04/14/10 cath), PACs. HTN, PVD, infrarenal AAA (4.2 cm 01/13/15 PET with one year follow-up recommended), carotid artery disease s/p left CEA 11/24/11 and right CEA '02, hyperlipidemia, stroke (2013), severe COPD, GERD, prostate cancer s/p radiation ~ 2010, hydradenitis. Current smoker. BMI 28. PCP is Dr. Loura Pardon.   Medications include: albuterol, Xanax, ASA, Plavix, Advair, Duoneb, metoprolol, pravastatin, Spiriva, Flomax. Dr. Lamonte Sakai instructed him to hold Plavix five days preoperatively and ASA 2 days.    Cardiologist is Dr. [name] next visit scheduled for 03/03/15. His last cardiology office visit was on 11/19/14 with Rosaria Ferries, PA-C for a pre-operative evaluation prior to his 12/25/14 bronchoscopy. Her assessment includes:  "Mr. Buckhalter is doing well from a cardiac viewpoint. He has been revascularized within the last 5 years. He has not had chest pain, any significant change in his activity level, and has no symptoms of volume overload. Most of his limitations are respiratory. He is at increased but acceptable risk for bronchoscopy and no further testing is indicated at this time. If additional procedures  are needed, this will have to be revisited. He is more than one year out from his drug-eluting stents, so okay to hold the Plavix as briefly as possible for the procedure."   11/19/14 EKG: NSR. Nonspecific ST and T wave abnormality.  04/14/10 Cardiac cath (done following abnormal Myoview showing inferior infarct with visually no ischemia but worrisome ECG changes and worsening chest pain):  -LM normal. LAD with diffuse luminal irregularities. There was mid stent, which was patent with diffuse luminal in-stent irregularities. There was long mid 30% stenosis. Distal stent was widely patent. D1 was small and ostial 70% stenosis. D2 was small and normal. Circumflex in the AV groove had diffuse luminal irregularities. Proximal AV groove had a focal 30% stenosis. The distal AV groove before small posterolaterals and a PDA had a focal 99% stenosis. There was a mid obtuse marginal, which was very large. There was a mid stent in the marginal which was widely patent. There was ostial 80-90% stenosis. The right coronary artery is dominant. There was small 99% stenosis. -Left Ventriculogram: Left ventriculogram was obtained in the RAO projection. The EF of 60% with inferior hypokinesis. -Successful percutaneous coronary intervention with placement of a drug-eluting stent in the atrioventricular groove circumflex artery and placement of a drug-eluting stent in the first obtuse marginal coronary artery.  12/20/13 Carotid duplex US: Widely patent B CEA without evidence of restenosis or hyperplasia.   12/25/14 1V CXR: IMPRESSION: Postop bronchoscopy and biopsy right upper lobe nodule. No pneumothorax.  Preoperative labs reviewed. A1C 10/30/14 was 5.9.    Patient tolerated similar procedure within the past month, so unless he is experiencing new CV issues then I would anticipate that he could proceed  as planned. Reviewed above with anesthesiologist Dr. Deatra Canter. He agrees with this  plan.  [facility]/Anesthesiology Phone [phone] 01/20/2015 11:08 AM

## 2015-01-21 NOTE — Anesthesia Preprocedure Evaluation (Addendum)
Anesthesia Evaluation  Patient identified by MRN, date of birth, ID band Patient awake    Reviewed: Allergy & Precautions, NPO status , Patient's Chart, lab work & pertinent test results  History of Anesthesia Complications Negative for: history of anesthetic complications  Airway Mallampati: I  TM Distance: <3 FB Neck ROM: Full    Dental  (+) Edentulous Upper, Edentulous Lower   Pulmonary asthma , COPD,  COPD inhaler, Current Smoker,    breath sounds clear to auscultation       Cardiovascular hypertension, Pt. on medications and Pt. on home beta blockers + CAD, + Past MI, + Cardiac Stents and + Peripheral Vascular Disease (4.2 cm AAA)   Rhythm:Regular Rate:Normal  EF 40-45 percent at cath 2012   Neuro/Psych S/p CEA CVA, Residual Symptoms    GI/Hepatic Neg liver ROS, GERD  Poorly Controlled,  Endo/Other  negative endocrine ROS  Renal/GU negative Renal ROS     Musculoskeletal   Abdominal   Peds  Hematology   Anesthesia Other Findings   Reproductive/Obstetrics                          Anesthesia Physical Anesthesia Plan  ASA: IV  Anesthesia Plan: General   Post-op Pain Management:    Induction: Intravenous  Airway Management Planned: Oral ETT  Additional Equipment:   Intra-op Plan:   Post-operative Plan: Extubation in OR  Informed Consent: I have reviewed the patients History and Physical, chart, labs and discussed the procedure including the risks, benefits and alternatives for the proposed anesthesia with the patient or authorized representative who has indicated his/her understanding and acceptance.     Plan Discussed with: CRNA and Surgeon  Anesthesia Plan Comments: (Plan routine monitors, GETA)        Anesthesia Quick Evaluation

## 2015-01-22 ENCOUNTER — Telehealth: Payer: Self-pay | Admitting: *Deleted

## 2015-01-22 ENCOUNTER — Ambulatory Visit (HOSPITAL_COMMUNITY): Payer: Medicare Other | Admitting: Vascular Surgery

## 2015-01-22 ENCOUNTER — Encounter (HOSPITAL_COMMUNITY)
Admission: RE | Disposition: A | Discharge status: Home / Self Care | Payer: Self-pay | Source: Ambulatory Visit | Attending: Emergency Medicine

## 2015-01-22 ENCOUNTER — Encounter (HOSPITAL_COMMUNITY): Payer: Self-pay | Admitting: *Deleted

## 2015-01-22 ENCOUNTER — Ambulatory Visit (HOSPITAL_COMMUNITY)
Admission: RE | Admit: 2015-01-22 | Discharge: 2015-01-22 | Disposition: A | Discharge status: Home / Self Care | Payer: Medicare Other | Source: Ambulatory Visit | Attending: Emergency Medicine | Admitting: Emergency Medicine

## 2015-01-22 ENCOUNTER — Ambulatory Visit (HOSPITAL_COMMUNITY): Payer: Medicare Other | Admitting: Anesthesiology

## 2015-01-22 DIAGNOSIS — I739 Peripheral vascular disease, unspecified: Secondary | ICD-10-CM | POA: Diagnosis not present

## 2015-01-22 DIAGNOSIS — R918 Other nonspecific abnormal finding of lung field: Secondary | ICD-10-CM | POA: Insufficient documentation

## 2015-01-22 DIAGNOSIS — F17210 Nicotine dependence, cigarettes, uncomplicated: Secondary | ICD-10-CM | POA: Insufficient documentation

## 2015-01-22 DIAGNOSIS — I10 Essential (primary) hypertension: Secondary | ICD-10-CM | POA: Insufficient documentation

## 2015-01-22 DIAGNOSIS — I252 Old myocardial infarction: Secondary | ICD-10-CM | POA: Insufficient documentation

## 2015-01-22 DIAGNOSIS — K219 Gastro-esophageal reflux disease without esophagitis: Secondary | ICD-10-CM | POA: Diagnosis not present

## 2015-01-22 DIAGNOSIS — I6930 Unspecified sequelae of cerebral infarction: Secondary | ICD-10-CM | POA: Insufficient documentation

## 2015-01-22 DIAGNOSIS — Z79899 Other long term (current) drug therapy: Secondary | ICD-10-CM | POA: Insufficient documentation

## 2015-01-22 DIAGNOSIS — C3411 Malignant neoplasm of upper lobe, right bronchus or lung: Secondary | ICD-10-CM | POA: Diagnosis present

## 2015-01-22 DIAGNOSIS — R599 Enlarged lymph nodes, unspecified: Secondary | ICD-10-CM

## 2015-01-22 DIAGNOSIS — I714 Abdominal aortic aneurysm, without rupture: Secondary | ICD-10-CM | POA: Diagnosis not present

## 2015-01-22 DIAGNOSIS — J449 Chronic obstructive pulmonary disease, unspecified: Secondary | ICD-10-CM | POA: Insufficient documentation

## 2015-01-22 DIAGNOSIS — I2510 Atherosclerotic heart disease of native coronary artery without angina pectoris: Secondary | ICD-10-CM | POA: Insufficient documentation

## 2015-01-22 DIAGNOSIS — Z955 Presence of coronary angioplasty implant and graft: Secondary | ICD-10-CM | POA: Diagnosis not present

## 2015-01-22 DIAGNOSIS — R590 Localized enlarged lymph nodes: Secondary | ICD-10-CM | POA: Diagnosis not present

## 2015-01-22 DIAGNOSIS — J45909 Unspecified asthma, uncomplicated: Secondary | ICD-10-CM | POA: Insufficient documentation

## 2015-01-22 DIAGNOSIS — Z7951 Long term (current) use of inhaled steroids: Secondary | ICD-10-CM | POA: Insufficient documentation

## 2015-01-22 DIAGNOSIS — Z7982 Long term (current) use of aspirin: Secondary | ICD-10-CM | POA: Insufficient documentation

## 2015-01-22 HISTORY — PX: VIDEO BRONCHOSCOPY WITH ENDOBRONCHIAL ULTRASOUND: SHX6177

## 2015-01-22 SURGERY — BRONCHOSCOPY, WITH EBUS
Anesthesia: General | Site: Chest

## 2015-01-22 MED ORDER — NEOSTIGMINE METHYLSULFATE 10 MG/10ML IV SOLN
INTRAVENOUS | Status: AC
  Filled 2015-01-22: qty 1

## 2015-01-22 MED ORDER — HYDROMORPHONE HCL 1 MG/ML IJ SOLN: 0.2500 mg | INTRAMUSCULAR | Status: DC | PRN

## 2015-01-22 MED ORDER — PHENYLEPHRINE HCL 10 MG/ML IJ SOLN
10.0000 mg | INTRAVENOUS | Status: DC | PRN
  Administered 2015-01-22: 25 ug/min via INTRAVENOUS

## 2015-01-22 MED ORDER — FENTANYL CITRATE (PF) 250 MCG/5ML IJ SOLN
INTRAMUSCULAR | Status: AC
  Filled 2015-01-22: qty 5

## 2015-01-22 MED ORDER — LIDOCAINE HCL (CARDIAC) 20 MG/ML IV SOLN
INTRAVENOUS | Status: DC | PRN
  Administered 2015-01-22: 65 mg via INTRATRACHEAL
  Administered 2015-01-22: 40 mg via INTRAVENOUS

## 2015-01-22 MED ORDER — NEOSTIGMINE METHYLSULFATE 10 MG/10ML IV SOLN
INTRAVENOUS | Status: DC | PRN
  Administered 2015-01-22: 3 mg via INTRAVENOUS

## 2015-01-22 MED ORDER — GLYCOPYRROLATE 0.2 MG/ML IJ SOLN
INTRAMUSCULAR | Status: AC
  Filled 2015-01-22: qty 2

## 2015-01-22 MED ORDER — ONDANSETRON HCL 4 MG/2ML IJ SOLN
INTRAMUSCULAR | Status: AC
  Filled 2015-01-22: qty 2

## 2015-01-22 MED ORDER — PROPOFOL 10 MG/ML IV BOLUS
INTRAVENOUS | Status: DC | PRN
  Administered 2015-01-22: 100 mg via INTRAVENOUS

## 2015-01-22 MED ORDER — ROCURONIUM BROMIDE 50 MG/5ML IV SOLN
INTRAVENOUS | Status: AC
  Filled 2015-01-22: qty 1

## 2015-01-22 MED ORDER — ARTIFICIAL TEARS OP OINT
TOPICAL_OINTMENT | OPHTHALMIC | Status: AC
  Filled 2015-01-22: qty 3.5

## 2015-01-22 MED ORDER — PROPOFOL 10 MG/ML IV BOLUS
INTRAVENOUS | Status: AC
  Filled 2015-01-22: qty 20

## 2015-01-22 MED ORDER — FENTANYL CITRATE (PF) 100 MCG/2ML IJ SOLN
INTRAMUSCULAR | Status: DC | PRN
  Administered 2015-01-22 (×2): 50 ug via INTRAVENOUS

## 2015-01-22 MED ORDER — LACTATED RINGERS IV SOLN
INTRAVENOUS | Status: DC | PRN
  Administered 2015-01-22: 08:00:00 via INTRAVENOUS

## 2015-01-22 MED ORDER — MEPERIDINE HCL 25 MG/ML IJ SOLN: 6.2500 mg | INTRAMUSCULAR | Status: DC | PRN

## 2015-01-22 MED ORDER — MIDAZOLAM HCL 2 MG/2ML IJ SOLN: 0.5000 mg | Freq: Once | INTRAMUSCULAR | Status: DC | PRN

## 2015-01-22 MED ORDER — ONDANSETRON HCL 4 MG/2ML IJ SOLN
INTRAMUSCULAR | Status: DC | PRN
  Administered 2015-01-22: 4 mg via INTRAVENOUS

## 2015-01-22 MED ORDER — GLYCOPYRROLATE 0.2 MG/ML IJ SOLN
INTRAMUSCULAR | Status: DC | PRN
  Administered 2015-01-22: 0.4 mg via INTRAVENOUS

## 2015-01-22 MED ORDER — MIDAZOLAM HCL 2 MG/2ML IJ SOLN
INTRAMUSCULAR | Status: AC
  Filled 2015-01-22: qty 4

## 2015-01-22 MED ORDER — CLOPIDOGREL BISULFATE 75 MG PO TABS: 75.0000 mg | ORAL_TABLET | Freq: Every day | ORAL | Status: DC

## 2015-01-22 MED ORDER — PROMETHAZINE HCL 25 MG/ML IJ SOLN: 6.2500 mg | INTRAMUSCULAR | Status: DC | PRN

## 2015-01-22 MED ORDER — ROCURONIUM BROMIDE 100 MG/10ML IV SOLN
INTRAVENOUS | Status: DC | PRN
  Administered 2015-01-22: 50 mg via INTRAVENOUS

## 2015-01-22 MED ORDER — DEXAMETHASONE SODIUM PHOSPHATE 4 MG/ML IJ SOLN
INTRAMUSCULAR | Status: DC | PRN
  Administered 2015-01-22: 4 mg via INTRAVENOUS

## 2015-01-22 MED ORDER — DEXAMETHASONE SODIUM PHOSPHATE 4 MG/ML IJ SOLN
INTRAMUSCULAR | Status: AC
  Filled 2015-01-22: qty 1

## 2015-01-22 MED ORDER — FENTANYL CITRATE (PF) 100 MCG/2ML IJ SOLN: 25.0000 ug | INTRAMUSCULAR | Status: DC | PRN

## 2015-01-22 MED ORDER — 0.9 % SODIUM CHLORIDE (POUR BTL) OPTIME
TOPICAL | Status: DC | PRN
  Administered 2015-01-22: 1000 mL

## 2015-01-22 SURGICAL SUPPLY — 26 items
BRUSH CYTOL CELLEBRITY 1.5X140 (MISCELLANEOUS) IMPLANT
CANISTER SUCTION 2500CC (MISCELLANEOUS) ×3 IMPLANT
CONT SPEC 4OZ CLIKSEAL STRL BL (MISCELLANEOUS) ×3 IMPLANT
COVER DOME SNAP 22 D (MISCELLANEOUS) ×3 IMPLANT
COVER TABLE BACK 60X90 (DRAPES) ×3 IMPLANT
FORCEPS BIOP RJ4 1.8 (CUTTING FORCEPS) IMPLANT
GAUZE SPONGE 4X4 12PLY STRL (GAUZE/BANDAGES/DRESSINGS) IMPLANT
GLOVE BIO SURGEON STRL SZ7.5 (GLOVE) ×3 IMPLANT
GOWN STRL REUS W/ TWL LRG LVL3 (GOWN DISPOSABLE) ×1 IMPLANT
GOWN STRL REUS W/TWL LRG LVL3 (GOWN DISPOSABLE) ×2
KIT CLEAN ENDO COMPLIANCE (KITS) ×6 IMPLANT
KIT ROOM TURNOVER OR (KITS) ×3 IMPLANT
MARKER SKIN DUAL TIP RULER LAB (MISCELLANEOUS) ×3 IMPLANT
NEEDLE BIOPSY TRANSBRONCH 21G (NEEDLE) IMPLANT
NEEDLE SONO TIP II EBUS (NEEDLE) IMPLANT
NS IRRIG 1000ML POUR BTL (IV SOLUTION) ×3 IMPLANT
OIL SILICONE PENTAX (PARTS (SERVICE/REPAIRS)) IMPLANT
PAD ARMBOARD 7.5X6 YLW CONV (MISCELLANEOUS) ×6 IMPLANT
STOPCOCK 4 WAY LG BORE MALE ST (IV SETS) ×3 IMPLANT
SYR 20CC LL (SYRINGE) ×3 IMPLANT
SYR 20ML ECCENTRIC (SYRINGE) ×6 IMPLANT
SYR 5ML LUER SLIP (SYRINGE) ×3 IMPLANT
TOWEL OR 17X24 6PK STRL BLUE (TOWEL DISPOSABLE) ×3 IMPLANT
TRAP SPECIMEN MUCOUS 40CC (MISCELLANEOUS) IMPLANT
TUBE CONNECTING 20'X1/4 (TUBING) ×2
TUBE CONNECTING 20X1/4 (TUBING) ×4 IMPLANT

## 2015-01-22 NOTE — Op Note (Signed)
Video Bronchoscopy with Endobronchial Ultrasound Procedure Note  Date of Operation: 01/22/2015  Pre-op Diagnosis: Mediastinal and R hilar lymphadenopathy  Post-op Diagnosis: same  Surgeon: Baltazar Apo  Assistants: none  Anesthesia: General endotracheal anesthesia  Operation: Flexible video fiberoptic bronchoscopy with endobronchial ultrasound and biopsies.  Estimated Blood Loss: none  Complications: none apparent  Indications and History: [name] is a 74 y.o. male with right upper lobe nodule that is newly diagnosed as non-small cell lung cancer suspicious for a squamous cell carcinoma. PET scan performed on 01/13/15 showed mild hypermetabolism of a small right pretracheal node and a A right hilar node. Recommend attention was made to sample the nodes  for possible metastatic disease via EBUS  prior to planning the patient's radiation therapy. The risks, benefits, complications, treatment options and expected outcomes were discussed with the patient.  The possibilities of pneumothorax, pneumonia, reaction to medication, pulmonary aspiration, perforation of a viscus, bleeding, failure to diagnose a condition and creating a complication requiring transfusion or operation were discussed with the patient who freely signed the consent.    Description of Procedure: The patient was examined in the preoperative area and history and data from the preprocedure consultation were reviewed. It was deemed appropriate to proceed.  The patient was taken to OR 10, identified as [name] and the procedure verified as Flexible Video Fiberoptic Bronchoscopy.  A Time Out was held and the above information confirmed. General anesthesia was initiated and the patient  was orally intubated. The video fiberoptic bronchoscope was introduced via the endotracheal tube and a general inspection was performed which showed grossly normal airways with some slight narrowing of the right upper lobe segmental  bronchi. Also noted was some mild hyperplasia or scarring of the proximal bronchus intermedius. There were white secretions noted but a full inspection showed no endobronchial lesions. The standard scope was then withdrawn and the endobronchial ultrasound was used to identify and characterize the peritracheal, hilar and bronchial lymph nodes. Inspection showed a slightly enlarged cluster of 4R lymph nodes in the pretracheal region superior to the azygos vein. Inspection for the 11 R node seen on PET scan was limited by the thickened mucosa of the proximal bronchus intermedius. No 10R or 11R nodes were found. Inspection of the remainder of the bronchial tree showed no evidence for abnormal lymphadenopathy elsewhere. Using real-time ultrasound guidance Wang needle biopsies were take from Station 4R nodes from multiple angles and were sent for cytology. Preliminary quick stain data confirmed that there was lymph node tissue present. The patient tolerated the procedure well without apparent complications. There was no significant blood loss. The bronchoscope was withdrawn. Anesthesia was reversed and the patient was taken to the PACU for recovery.   Samples: 1. Wang needle biopsies from 4R node   Plans:  The patient will be discharged from the PACU to home when recovered from anesthesia. We will review the cytology results with the patient when they become available. Outpatient followup will be with Dr Lamonte Sakai. Baltazar Apo, MD, PhD 01/22/2015, 10:09 AM [facility] [phone] or if no answer [phone]

## 2015-01-22 NOTE — Telephone Encounter (Signed)
Daughter Magda Paganini called, patient just had chest biopsy today, ,path report not back  Until Friday, spoke with Dr. Pablo Ledger, cancel tomorrow's CT simulation will reschedule for Tuesday, Aaron Edelman RT will call patient new time, patient and daughter both informed,waiting for call back appt 12:22 PM

## 2015-01-22 NOTE — Anesthesia Postprocedure Evaluation (Signed)
  Anesthesia Post-op Note  Patient: [name]  Procedure(s) Performed: Procedure(s): VIDEO BRONCHOSCOPY WITH ENDOBRONCHIAL ULTRASOUND with Biopsies (N/A)  Patient Location: PACU  Anesthesia Type:General  Level of Consciousness: awake, alert , oriented and patient cooperative  Airway and Oxygen Therapy: Patient Spontanous Breathing and Patient connected to nasal cannula oxygen  Post-op Pain: none  Post-op Assessment: Post-op Vital signs reviewed, Patient's Cardiovascular Status Stable, Respiratory Function Stable, Patent Airway, No signs of Nausea or vomiting and Pain level controlled              Post-op Vital Signs: Reviewed and stable  Last Vitals:  Filed Vitals:   01/22/15 1100  BP:   Pulse: 68  Temp:   Resp:     Complications: No apparent anesthesia complications

## 2015-01-22 NOTE — Discharge Instructions (Signed)
Flexible Bronchoscopy, Care After These instructions give you information on caring for yourself after your procedure. Your doctor may also give you more specific instructions. Call your doctor if you have any problems or questions after your procedure. HOME CARE  Do not eat or drink anything for 2 hours after your procedure. If you try to eat or drink before the medicine wears off, food or drink could go into your lungs. You could also burn yourself.  After 2 hours have passed and when you can cough and gag normally, you may eat soft food and drink liquids slowly.  The day after the test, you may eat your normal diet.  You may do your normal activities.  Keep all doctor visits. GET HELP RIGHT AWAY IF:  You get more and more short of breath.  You get light-headed.  You feel like you are going to pass out (faint).  You have chest pain.  You have new problems that worry you.  You cough up more than a little blood.  You cough up more blood than before. MAKE SURE YOU:  Understand these instructions.  Will watch your condition.  Will get help right away if you are not doing well or get worse.   This information is not intended to replace advice given to you by your health care provider. Make sure you discuss any questions you have with your health care provider.   Document Released: 01/03/2009 Document Revised: 03/13/2013 Document Reviewed: 11/10/2012 Elsevier Interactive Patient Education [employer].  Please call our office for any questions or concerns. [phone].

## 2015-01-22 NOTE — Anesthesia Procedure Notes (Signed)
Procedure Name: Intubation Date/Time: 01/22/2015 8:37 AM Performed by: YACOUB, ALISHA B Pre-anesthesia Checklist: Patient identified, Emergency Drugs available, Suction available, Patient being monitored and Timeout performed Patient Re-evaluated:Patient Re-evaluated prior to inductionOxygen Delivery Method: Circle system utilized Preoxygenation: Pre-oxygenation with 100% oxygen Intubation Type: IV induction Ventilation: Mask ventilation without difficulty and Oral airway inserted - appropriate to patient size Laryngoscope Size: Mac and 4 Grade View: Grade II Tube type: Oral Tube size: 8.5 mm Number of attempts: 1 Airway Equipment and Method: Stylet and LTA kit utilized Placement Confirmation: ETT inserted through vocal cords under direct vision,  positive ETCO2 and breath sounds checked- equal and bilateral Secured at: 24 cm Tube secured with: Tape Dental Injury: Teeth and Oropharynx as per pre-operative assessment      

## 2015-01-22 NOTE — Transfer of Care (Signed)
Immediate Anesthesia Transfer of Care Note  Patient: [name]  Procedure(s) Performed: Procedure(s): VIDEO BRONCHOSCOPY WITH ENDOBRONCHIAL ULTRASOUND with Biopsies (N/A)  Patient Location: PACU  Anesthesia Type:General  Level of Consciousness: awake, alert  and oriented  Airway & Oxygen Therapy: Patient Spontanous Breathing and Patient connected to nasal cannula oxygen  Post-op Assessment: Report given to RN and Post -op Vital signs reviewed and stable  Post vital signs: Reviewed and stable  Last Vitals:  Filed Vitals:   01/22/15 1017  BP: 117/62  Pulse: 73  Temp:   Resp: 18    Complications: No apparent anesthesia complications

## 2015-01-22 NOTE — Interval H&P Note (Signed)
PCCM INterval Note  Pt presents today for further eval of his mediastinal LAD noted on PET scan. He had ENB on 10/5 that dx his RUL nodule as NSCLCA, probably squamous cell CA. He stopped plavix 5 days ago. He has stable cough, denies CP. His breathing has not changed. He is scheduled for XRT simulation tomorrow, but this will need to be postponed until this path is available. Note that he had a myocardial stress test ordered over 1.5 yrs ago that he never underwent. A cardiology evaluation in 10/2014 indicated that he was at increased but acceptable risk for bronchoscopy under anesthesia.   Filed Vitals:   01/22/15 0704  BP: 179/85  Pulse: 73  Temp: 98 F (36.7 C)  TempSrc: Oral  Resp: 18  Height: 5' 8.75" (1.746 m)  Weight: 86.098 kg (189 lb 13 oz)  SpO2: 99%   Gen: Pleasant, well-nourished, in no distress,  normal affect  ENT: No lesions,  mouth clear,  oropharynx clear, no postnasal drip  Neck: No JVD, no TMG, no carotid bruits  Lungs: No use of accessory muscles, Mild insp and exp rhonchi  Cardiovascular: RRR, heart sounds normal, no murmur or gallops, no peripheral edema  Musculoskeletal: No deformities, no cyanosis or clubbing  Neuro: alert, non focal  Skin: Warm, no lesions or rashes  Plans:  EBUS with nodal biopsies today. He needs to reschedule his XRT simulation as the ports will be influenced by the results.   Baltazar Apo, MD, PhD 01/22/2015, 8:30 AM [facility] [phone] or if no answer [phone]

## 2015-01-22 NOTE — H&P (View-Only) (Signed)
Subjective:  Patient ID: [name], male    DOB: 16-Oct-1940, 74 y.o.   MRN: [id number] HPI 74 yo smoker (~100pk-yrs), hx of CAD/PTCI, HTN,  prostate CA. Dx with COPD around 10 yrs ago. Last seen 2007 by PW. Maintanance was Advair bid. Previously on Spiriva, stopped in 2008.   ROV 04/06/13 -- severe COPD, seen for an AE in mid December. He got better on pred, then a little more cough. Still smoking. Returns today describing cough and mucous, not as bad as the exacerbation.    ROV 06/04/13 -- severe COPD, also CAD. He reports today that he has had some vertigo that feels like spinning. Happens when he changes position. He has had this before several yrs ago. He is on advair + spiriva. He has had some episodes of dyspnea, had to use his neb x 2 last week. Otherwise doing well. He is still smoking 0.5 pk a day.   05/31/14 Acute OV : severe COPD  Presents for an acute office visit.  Complains of productive cough with pale green mucus, increased SOB with wheezing and chest tightness x 2 weeks.  Denies f/c/s, n/v, hemoptysis and edema  No recnet abx . No ER visit.  No otc used.  >>Levaquin and pred taper   Follow up : Severe COPD 06/28/14 Patient returns for a three-week follow-up. Patient was seen in the office 3 weeks ago for COPD exacerbation. He was treated with Levaquin and a prednisone taper. Patient is feeling much improved with decreased cough and wheezing. Has minimally productive with white mucus.  He does continue smoke. We discussed smoking cessation. Chest x-ray last visit. Head showed a right upper lobe nodule. Repeat chest x-ray today shows a persistent questionable pulmonary nodule in the right upper lobe. No weight loss or hemoptysis  He denies any chest pain, orthopnea, PND, or increased leg swelling  ROV 07/18/14 -- follow-up visit for severe COPD, continued tobacco use. He underwent a chest x-ray 06/28/14 that showed a right upper lobe nodule. A subsequent CT scan of the chest  performed 4/14 showed a 12 mm cavitary right upper lobe nodule without any evidence of associated lymphadenopathy. PET scan performed 07/18/14 shows malignant range hypermetabolism in the right upper lobe nodule without any clear associated hilar or mediastinal adenopathy. No TB exposure, no real cough now. No wt loss - he has actually gained some.    ROV 10/17/14 -- follow-up visit for severe COPD. He continues to smoke. We have been following a right upper lobe 12 mm nodule that is hypermetabolic on PET scan performed 07/18/14. He underwent a repeat CT chest on 10/03/14 that I have personally reviewed.  This shows an interval increase in size in his right upper lobe cavitary nodule which now measures 1.5 x 1.5 cm. He also has stable left upper lobe nodules.  He continues to work 10 days out of the month.  He continues to smoke about 3/4 a pk a day.  He is followed for his CAD by Dr Glennie Hawk, last stent was in 2012.  He reports increased cough and some change in color his sputum to a greenish yellow. No fever. Some continued wheeze  ROV 11/28/14 -- follow-up visit for tobacco use, severe COPD, an enlarging PET positive right upper lobe cavitary nodule as well as some stable left upper lobe nodules. I asked him to be seen by cardiology for screening and in preparation for a possible bronchoscopy. He was seen on 11/19/14 and It was felt that  he was stable to proceed. We discussed in detail today. Have recommended and decided to proceed. He is on plavix, will need to stop for 5-7 days.   ROV 01/07/15 -- follow-up visit for tobacco use, severe COPD and abnormal CT scan of the chest. 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 He has been seen by Dr. Pablo Ledger in radiation oncology regarding SBRT. He is scheduled for PET on 10/24, simulation on 10/26. He is back on plavix.    Objective:  Physical Exam Filed Vitals:    01/07/15 0941  BP: 128/72  Pulse: 81  SpO2: 98%    Gen: Pleasant, obese man, in no distress,  normal affect  ENT: No lesions,  mouth clear,  oropharynx clear, no postnasal drip  Neck: No JVD, no TMG, no carotid bruits  Lungs: No use of accessory muscles, coarse breath sounds bilaterally, expiratory rhonchi  Cardiovascular: RRR, heart sounds normal, no murmur or gallops, no peripheral edema  Musculoskeletal: No deformities, no cyanosis or clubbing  Neuro: alert, non focal  Skin: Warm, no lesions or rashes   10/03/14 --  CLINICAL DATA: 74 year old male with history of right upper lobe nodule. Super D protocol require prior to bronchoscopy.  EXAM: CT CHEST WITHOUT CONTRAST  TECHNIQUE: Multidetector CT imaging of the chest was performed using thin slice collimation for electromagnetic bronchoscopy planning purposes, without intravenous contrast.  COMPARISON: Chest CT 07/03/2014. PET-CT 07/18/2014.  FINDINGS: Mediastinum/Lymph Nodes: Heart size is normal. There is no significant pericardial fluid, thickening or pericardial calcification. There is atherosclerosis of the thoracic aorta, the great vessels of the mediastinum and the coronary arteries, including calcified atherosclerotic plaque in the left main, left anterior descending, left circumflex and right coronary arteries. Coronary artery stents in the left anterior descending and left circumflex coronary arteries. 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 Extensive calcifications of the aortic valve. No pathologically enlarged mediastinal or hilar lymph nodes. Please note that accurate exclusion of hilar adenopathy is limited on noncontrast CT scans. Esophagus is unremarkable in  appearance. No axillary lymphadenopathy.  Lungs/Pleura: In the periphery of the right upper lobe (image 131 of series 4) there is again a thick-walled cavitary lesion with some internal architecture which currently measures 1.5 x 1.5 cm, concerning for small cavitary neoplasm. 4 mm left upper lobe pulmonary nodule (image 190 of series 4), similar prior. 5 mm left upper lobe pulmonary nodule near the apex (image 79 of series 4), similar prior. Mild diffuse bronchial wall thickening with mild centrilobular and paraseptal emphysema. Areas of bilateral apical pleuroparenchymal thickening and architectural distortion, most compatible with chronic post infectious or inflammatory areas of scarring. No acute consolidative airspace disease. No pleural effusions.  Upper Abdomen: Small calcified granuloma in the spleen. Atherosclerosis in the visualized vasculature. 13 mm short axis gastrohepatic ligament lymph node is unchanged.  Musculoskeletal/Soft Tissues: There are no aggressive appearing lytic or blastic lesions noted in the visualized portions of the skeleton.  IMPRESSION: 1. Interval growth of being a 1.5 x 1.5 cm cavitary nodule in the right upper lobe, which remains concerning for potential cavitary neoplasm. 2. Other small 4 mm and 5 mm left upper lobe pulmonary nodules are unchanged. 3. Mild diffuse bronchial wall thickening with mild centrilobular and paraseptal emphysema; imaging findings suggestive of  underlying COPD. 4. Atherosclerosis, including left main and 3 vessel coronary artery disease. Please note that although the presence of coronary artery calcium documents the presence of coronary artery disease, the severity of this disease and any potential stenosis cannot be assessed on this non-gated CT examination. There is, however, evidence of prior myocardial infarctions in the left anterior descending and left circumflex coronary artery territories, as discussed  above. Assessment for potential risk factor modification, dietary therapy or pharmacologic therapy may be warranted, if clinically indicated. 5. There are calcifications of the aortic valve. Echocardiographic correlation for evaluation of potential valvular dysfunction may be warranted if clinically indicated. 6. Additional incidental findings, similar priors, as above.   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 PET scan scheduled for 01/13/15 and simulation scheduled for 01/15/15.   Chronic obstructive pulmonary disease Severe disease, continues to smoke. We discussed cessation in detail. He will continue to try to cut down slowly - next goal will be getting down to 7 cigarettes daily. Continue Spiriva and Symbicort as he is taking them. Follow in 3 months

## 2015-01-23 ENCOUNTER — Ambulatory Visit: Payer: Medicare Other | Admitting: Radiation Oncology

## 2015-01-23 ENCOUNTER — Encounter (HOSPITAL_COMMUNITY): Payer: Self-pay | Admitting: Emergency Medicine

## 2015-01-23 ENCOUNTER — Ambulatory Visit
Admit: 2015-01-23 | Discharge: 2015-01-23 | Disposition: A | Discharge status: Home / Self Care | Payer: Medicare Other | Attending: Radiation Oncology | Admitting: Radiation Oncology

## 2015-01-23 DIAGNOSIS — Z510 Encounter for antineoplastic radiation therapy: Secondary | ICD-10-CM | POA: Insufficient documentation

## 2015-01-23 DIAGNOSIS — C3411 Malignant neoplasm of upper lobe, right bronchus or lung: Secondary | ICD-10-CM | POA: Insufficient documentation

## 2015-01-23 NOTE — Progress Notes (Addendum)
[facility] Simulation and Treatment Planning Note   Name: [name] MRN: [id number]  Date: 01/28/15  DOB: 04/21/1940   DIAGNOSIS: NSCLC of the right upper lobe  CONSENT VERIFIED: yes  SET UP AND IMMOBILIZATION: Patient is setup supine in a vac loc with a custom moldable pillow for head and neck immobilization   NARRATIVE: The patient was brought to the [facility].  Identity was confirmed.  All relevant records and images related to the planned course of therapy were reviewed.  Then, the patient was positioned in a stable reproducible clinical set-up for radiation therapy.  CT images were obtained.  Skin markings were placed.  A four dimensional simulation was then performed to track tumor movement throughout the patients' breathing cycle. The CT images were loaded into the planning software where the target and avoidance structures were contoured.  The GTV was outlined on the free breathing, 4D and MIP image sets.  The radiation prescription was entered and confirmed.   TREATMENT PLANNING NOTE:  Treatment planning then occurred. I have requested 3D simulation with [facility] of the spinal cord, total lungs and gross tumor volume. I have also requested mlcs and an isodose plan.     This document serves as a record of services personally performed by Thea Silversmith , MD. It was created on her behalf by Lenn Cal, a trained medical scribe. The creation of this record is based on the scribe's personal observations and the provider's statements to them. This document has been checked and approved by the attending provider.   ------------------------------------------------  Thea Silversmith, MD

## 2015-01-24 ENCOUNTER — Telehealth: Payer: Self-pay | Admitting: Emergency Medicine

## 2015-01-24 NOTE — Telephone Encounter (Signed)
Pt returning call.[name] ° °

## 2015-01-24 NOTE — Telephone Encounter (Signed)
Left message for pt to call back  °

## 2015-01-24 NOTE — Telephone Encounter (Signed)
Please call the patient and let him know that his bronchoscopy from Wednesday 11/2 showed normal lymph node tissue without any evidence of lung cancer present in the node that I was able to sample. This is good news. I will forward the resuts to Dr Pablo Ledger.

## 2015-01-27 ENCOUNTER — Other Ambulatory Visit: Payer: Self-pay | Admitting: Family Medicine

## 2015-01-27 NOTE — Telephone Encounter (Signed)
Pt is aware of results. Nothing further was needed. 

## 2015-01-27 NOTE — Progress Notes (Signed)
This encounter was created in error - please disregard.

## 2015-01-28 ENCOUNTER — Ambulatory Visit
Admit: 2015-01-28 | Discharge: 2015-01-28 | Disposition: A | Discharge status: Home / Self Care | Payer: Medicare Other | Attending: Radiation Oncology | Admitting: Radiation Oncology

## 2015-01-28 DIAGNOSIS — Z510 Encounter for antineoplastic radiation therapy: Secondary | ICD-10-CM | POA: Diagnosis not present

## 2015-01-28 DIAGNOSIS — C3411 Malignant neoplasm of upper lobe, right bronchus or lung: Secondary | ICD-10-CM

## 2015-01-28 NOTE — Addendum Note (Signed)
Encounter addended by: Thea Silversmith, MD on: 01/28/2015 10:15 AM<BR>     Documentation filed: Notes Section

## 2015-02-03 ENCOUNTER — Telehealth: Payer: Self-pay | Admitting: *Deleted

## 2015-02-03 NOTE — Telephone Encounter (Signed)
Oncology Nurse Navigator Documentation  Oncology Nurse Navigator Flowsheets 02/03/2015  Navigator Encounter Type Telephone/I called [name] to follow up with him regarding tx.  It looks like patient had SIM on 11/3 but no follow up appt for tx.  I asked that he call me.  I left my name and phone number to call.   Patient Visit Type Follow-up  Treatment Phase Treatment  Interventions Coordination of Care  Coordination of Care -  Time Spent with Patient 15

## 2015-02-04 ENCOUNTER — Telehealth: Payer: Self-pay | Admitting: *Deleted

## 2015-02-04 NOTE — Telephone Encounter (Signed)
Oncology Nurse Navigator Documentation  Oncology Nurse Navigator Flowsheets 02/04/2015  Navigator Encounter Type Telephone/Mr. Kawecki called me and left me a vm message to call.  I called him back.  I asked him about his tx plan.  His is to get XRT on 11/29, 12/2, and 12/6.  These appt are not in the EPIC.  I will follow up with [facility] On[facility] scheduling.    Patient Visit Type Follow-up  Treatment Phase Treatment  Interventions Coordination of Care  Coordination of Care -  Time Spent with Patient 15

## 2015-02-05 DIAGNOSIS — C3411 Malignant neoplasm of upper lobe, right bronchus or lung: Secondary | ICD-10-CM | POA: Diagnosis not present

## 2015-02-05 DIAGNOSIS — Z510 Encounter for antineoplastic radiation therapy: Secondary | ICD-10-CM | POA: Diagnosis not present

## 2015-02-18 ENCOUNTER — Ambulatory Visit
Admission: RE | Admit: 2015-02-18 | Discharge: 2015-02-18 | Disposition: A | Discharge status: Home / Self Care | Payer: Medicare Other | Source: Ambulatory Visit | Attending: Radiation Oncology | Admitting: Radiation Oncology

## 2015-02-18 ENCOUNTER — Encounter: Payer: Self-pay | Admitting: Family

## 2015-02-18 DIAGNOSIS — Z510 Encounter for antineoplastic radiation therapy: Secondary | ICD-10-CM | POA: Diagnosis not present

## 2015-02-18 DIAGNOSIS — C3411 Malignant neoplasm of upper lobe, right bronchus or lung: Secondary | ICD-10-CM

## 2015-02-18 NOTE — Progress Notes (Signed)
Weekly Management Note Current Dose: 18  Gy  Projected Dose: 54 Gy   Narrative:  The patient presents for routine under treatment assessment.  CBCT/MVCT images/Port film x-rays were reviewed.  The chart was checked. Doing well. No complaints. Tolerated first treatment well.   Physical Findings: Weight:  . Unchanged  Impression:  The patient is tolerating radiation.  Plan:  Continue treatment as planned. Follow up in 1 month.

## 2015-02-20 ENCOUNTER — Encounter: Payer: Self-pay | Admitting: Emergency Medicine

## 2015-02-20 ENCOUNTER — Ambulatory Visit (INDEPENDENT_AMBULATORY_CARE_PROVIDER_SITE_OTHER): Payer: Medicare Other | Admitting: Emergency Medicine

## 2015-02-20 VITALS — BP 110/72 | HR 83 | Wt 192.0 lb

## 2015-02-20 DIAGNOSIS — J449 Chronic obstructive pulmonary disease, unspecified: Secondary | ICD-10-CM

## 2015-02-20 DIAGNOSIS — J209 Acute bronchitis, unspecified: Secondary | ICD-10-CM

## 2015-02-20 DIAGNOSIS — C3411 Malignant neoplasm of upper lobe, right bronchus or lung: Secondary | ICD-10-CM

## 2015-02-20 MED ORDER — AZITHROMYCIN 250 MG PO TABS: ORAL_TABLET | ORAL | Status: AC

## 2015-02-20 NOTE — Progress Notes (Signed)
Subjective:  Patient ID: [name], male    DOB: [birth year], 74 y.o.   MRN: [id number] HPI 74 yo smoker (~100pk-yrs), hx of CAD/PTCI, HTN,  prostate CA. Dx with COPD around 10 yrs ago. Last seen 2007 by PW. Maintanance was Advair bid. Previously on Spiriva, stopped in 2008.   ROV 04/06/13 -- severe COPD, seen for an AE in mid December. He got better on pred, then a little more cough. Still smoking. Returns today describing cough and mucous, not as bad as the exacerbation.    ROV 06/04/13 -- severe COPD, also CAD. He reports today that he has had some vertigo that feels like spinning. Happens when he changes position. He has had this before several yrs ago. He is on advair + spiriva. He has had some episodes of dyspnea, had to use his neb x 2 last week. Otherwise doing well. He is still smoking 0.5 pk a day.   05/31/14 Acute OV : severe COPD  Presents for an acute office visit.  Complains of productive cough with pale green mucus, increased SOB with wheezing and chest tightness x 2 weeks.  Denies f/c/s, n/v, hemoptysis and edema  No recnet abx . No ER visit.  No otc used.  >>Levaquin and pred taper   Follow up : Severe COPD 06/28/14 Patient returns for a three-week follow-up. Patient was seen in the office 3 weeks ago for COPD exacerbation. He was treated with Levaquin and a prednisone taper. Patient is feeling much improved with decreased cough and wheezing. Has minimally productive with white mucus.  He does continue smoke. We discussed smoking cessation. Chest x-ray last visit. Head showed a right upper lobe nodule. Repeat chest x-ray today shows a persistent questionable pulmonary nodule in the right upper lobe. No weight loss or hemoptysis  He denies any chest pain, orthopnea, PND, or increased leg swelling  ROV 07/18/14 -- follow-up visit for severe COPD, continued tobacco use. He underwent a chest x-ray 06/28/14 that showed a right upper lobe nodule. A subsequent CT scan of the chest  performed 4/14 showed a 12 mm cavitary right upper lobe nodule without any evidence of associated lymphadenopathy. PET scan performed 07/18/14 shows malignant range hypermetabolism in the right upper lobe nodule without any clear associated hilar or mediastinal adenopathy. No TB exposure, no real cough now. No wt loss - he has actually gained some.    ROV 10/17/14 -- follow-up visit for severe COPD. He continues to smoke. We have been following a right upper lobe 12 mm nodule that is hypermetabolic on PET scan performed 07/18/14. He underwent a repeat CT chest on 10/03/14 that I have personally reviewed.  This shows an interval increase in size in his right upper lobe cavitary nodule which now measures 1.5 x 1.5 cm. He also has stable left upper lobe nodules.  He continues to work 10 days out of the month.  He continues to smoke about 3/4 a pk a day.  He is followed for his CAD by Dr Glennie Hawk, last stent was in 2012.  He reports increased cough and some change in color his sputum to a greenish yellow. No fever. Some continued wheeze  ROV 11/28/14 -- follow-up visit for tobacco use, severe COPD, an enlarging PET positive right upper lobe cavitary nodule as well as some stable left upper lobe nodules. I asked him to be seen by cardiology for screening and in preparation for a possible bronchoscopy. He was seen on 11/19/14 and It was felt that  he was stable to proceed. We discussed in detail today. Have recommended and decided to proceed. He is on plavix, will need to stop for 5-7 days.   ROV 01/07/15 -- follow-up visit for tobacco use, severe COPD and abnormal CT scan of the chest. 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 He has been seen by Dr. Pablo Ledger in radiation oncology regarding SBRT. He is scheduled for PET on 10/24, simulation on 10/26. He is back on plavix.   ROV 02/20/15 -- follow-up visit for severe  COPD, newly diagnosed squamous cell lung cancer. He is undergoing SBRT under the direction of Dr Pablo Ledger.  He underwent PET scan on 01/13/15 that I personally reviewed. He is managed on Spiriva and Advair. Cost is an issue and he is wondering if he can stop the Advair. He is tolerating XRT. Having some cough, some pale green phlegm. Wheezes frequently. Uses SABA 1-2x a day.    Objective:  Physical Exam Filed Vitals:   02/20/15 1152  BP: 110/72  Pulse: 83  Weight: 192 lb (87.091 kg)  SpO2: 95%    Gen: Pleasant, overweight man, in no distress,  normal affect  ENT: No lesions,  mouth clear,  oropharynx clear, no postnasal drip  Neck: No JVD, no TMG, no carotid bruits  Lungs: No use of accessory muscles, coarse breath sounds bilaterally, no wheezing  Cardiovascular: RRR, heart sounds normal, no murmur or gallops, no peripheral edema  Musculoskeletal: No deformities, no cyanosis or clubbing  Neuro: alert, non focal  Skin: Warm, no lesions or rashes   01/13/15 COMPARISON: 07/18/2014  FINDINGS: NECK  No hypermetabolic lymph nodes in the neck.  CHEST  Cavitary nodule in the peripheral right upper lobe has increased in size, currently measuring 2.0 cm on image 24/series 8 compared to 1.4 cm previously. This is hypermetabolic, with SUV max of 12.2 compared with 5.1 previously.  New multifocal hypermetabolic airspace disease is seen in the right middle and lower lobes, most likely infectious or inflammatory in etiology. There is also a ill-defined area of airspace opacity in the superior segment of the right lower lobe on image 36/series 8, which is also hypermetabolic but likely infectious or inflammatory in etiology rather than neoplastic. Mild emphysema again noted.  A 9 mm right paratracheal mediastinal lymph node is seen which is not significant changed size since previous study. This shows new mild hypermetabolic activity on today's exam, with SUV max of  3.8. Sub-cm hypermetabolic right hilar lymph node also seen with SUV max of 4.3. These are indeterminate and could be reactive in etiology given right lung airspace disease, although early lymph node metastases cannot be excluded.  ABDOMEN/PELVIS  No abnormal hypermetabolic activity within the liver, pancreas, adrenal glands, or spleen.  14 mm gastrohepatic ligament lymph node shows no significant change in size compared to previous study on image 114/series 4, however this shows increased hypermetabolic activity, with SUV max currently measuring 4.1 compared to 2.7 previously. No other hypermetabolic lymph nodes identified within the abdomen or pelvis.  Large benign-appearing left renal cyst remains stable. 4.2 cm infrarenal abdominal aortic aneurysm is also unchanged.  SKELETON  No focal hypermetabolic activity to suggest skeletal metastasis.  IMPRESSION: 2.0 cm cavitary right upper lobe nodule shows increased size and hypermetabolic activity, consistent with primary bronchogenic carcinoma.  New hypermetabolic multifocal airspace disease in right middle and lower lobes, most likely infectious or inflammatory in etiology. Short-term followup by  CT recommended in 2-3 months.  Small lymph nodes in right paratracheal region, right hilum, and gastrohepatic ligament remain stable in size but show mildly increased metabolic activity. These are indeterminate and could be reactive in etiology given right lung airspace disease, although early lymph node metastases cannot be excluded. Attention also recommended on follow-up CT.   Assessment & Plan:  Acute bronchitis He has had an increased amount of greenish mucus, more dyspnea. I believe it'll be reasonable to treat him with a short course of azithromycin for an evolving bronchitis  Chronic obstructive pulmonary disease No evidence of acute exacerbation but he does probably have an evolving bronchitis. We'll treat as  below. He is currently on Spiriva and Advair and we will continue these until his current Advair runs out. We discussed possibly changing to either Anoro or Stiolto. He will check his insurance formulary and let me know what tier coverage they have. We will decide based on this. 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

## 2015-02-20 NOTE — Patient Instructions (Addendum)
Please continue your Advair and Spiriva as you are taking them for now.  We will consider changing your inhalers to either Stiolto or Anoro at some point. Check your formulary to see what tier these medications are. We will discuss further at your next visit.  Please take azithromycin for 5 days as directed.  Follow with Dr Lamonte Sakai in January as already scheduled.

## 2015-02-20 NOTE — Assessment & Plan Note (Signed)
No evidence of acute exacerbation but he does probably have an evolving bronchitis. We'll treat as below. He is currently on Spiriva and Advair and we will continue these until his current Advair runs out. We discussed possibly changing to either Anoro or Stiolto. He will check his insurance formulary and let me know what tier coverage they have. We will decide based on this. Follow in January

## 2015-02-20 NOTE — Assessment & Plan Note (Signed)
He has had an increased amount of greenish mucus, more dyspnea. I believe it'll be reasonable to treat him with a short course of azithromycin for an evolving bronchitis

## 2015-02-20 NOTE — Assessment & Plan Note (Signed)
Undergoing stereotactic radiation with Dr Pablo Ledger.

## 2015-02-21 ENCOUNTER — Ambulatory Visit (HOSPITAL_COMMUNITY)
Admission: RE | Admit: 2015-02-21 | Discharge: 2015-02-21 | Disposition: A | Discharge status: Home / Self Care | Payer: Medicare Other | Source: Ambulatory Visit | Attending: Family | Admitting: Family

## 2015-02-21 ENCOUNTER — Ambulatory Visit
Admission: RE | Admit: 2015-02-21 | Discharge: 2015-02-21 | Disposition: A | Discharge status: Home / Self Care | Payer: Medicare Other | Source: Ambulatory Visit | Attending: Radiation Oncology | Admitting: Radiation Oncology

## 2015-02-21 ENCOUNTER — Encounter: Payer: Self-pay | Admitting: Family

## 2015-02-21 ENCOUNTER — Ambulatory Visit (INDEPENDENT_AMBULATORY_CARE_PROVIDER_SITE_OTHER): Payer: Medicare Other | Admitting: Family

## 2015-02-21 ENCOUNTER — Telehealth: Payer: Self-pay | Admitting: *Deleted

## 2015-02-21 VITALS — BP 139/77 | HR 75 | Temp 98.2°F | Resp 16 | Ht 69.75 in | Wt 191.0 lb

## 2015-02-21 DIAGNOSIS — E785 Hyperlipidemia, unspecified: Secondary | ICD-10-CM | POA: Insufficient documentation

## 2015-02-21 DIAGNOSIS — Z720 Tobacco use: Secondary | ICD-10-CM | POA: Diagnosis not present

## 2015-02-21 DIAGNOSIS — C3411 Malignant neoplasm of upper lobe, right bronchus or lung: Secondary | ICD-10-CM | POA: Diagnosis not present

## 2015-02-21 DIAGNOSIS — Z9889 Other specified postprocedural states: Secondary | ICD-10-CM

## 2015-02-21 DIAGNOSIS — Z48812 Encounter for surgical aftercare following surgery on the circulatory system: Secondary | ICD-10-CM | POA: Diagnosis not present

## 2015-02-21 DIAGNOSIS — I6523 Occlusion and stenosis of bilateral carotid arteries: Secondary | ICD-10-CM | POA: Diagnosis not present

## 2015-02-21 DIAGNOSIS — Z510 Encounter for antineoplastic radiation therapy: Secondary | ICD-10-CM | POA: Diagnosis not present

## 2015-02-21 DIAGNOSIS — I10 Essential (primary) hypertension: Secondary | ICD-10-CM | POA: Diagnosis not present

## 2015-02-21 DIAGNOSIS — F17200 Nicotine dependence, unspecified, uncomplicated: Secondary | ICD-10-CM

## 2015-02-21 NOTE — Addendum Note (Signed)
Addended by: Dorthula Rue L on: 02/21/2015 02:43 PM   Modules accepted: Orders

## 2015-02-21 NOTE — Progress Notes (Signed)
Chief Complaint: Carotid Artery Stenosis   History of Present Illness  [name] is a 74 y.o. male patient of Dr. [name] who presents for scheduled follow up of bilateral carotid endarterectomies. Initially this was the right carotid in 2002 and he underwent left carotid endarterectomy in September of 2013.   His PMHx is significant for 8 cardiac stents. Unfortunately he has resumed smoking, but he is now motivated to quit, has tried Chantix, caused overly vivid dreams, he does not want to have suicidal feelings that Chantix may cause. Dr. [name] is his cardiologist.   He had a TIA perioperatively in 2013 and states the numbness in his right 5th finger is about the same, no other neurological deficits. The patient denies amaurosis fugax or monocular blindness. The patient denies facial drooping. He denies any further stroke or TIA symptoms.  Pt. denies hemiplegia. The patient denies receptive or expressive aphasia.  He denies claudication sx's with walking, denies non healing wounds.    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  Pt Diabetic: No  Pt smoker: smoker (1/2 ppd, decreased from 3 ppd, started in his teens)   Pt meds include:  Statin : Yes  Betablocker: Yes  ASA: Yes  Other anticoagulants/antiplatelets: Plavix     Past Medical History  Diagnosis Date  . COPD (chronic obstructive pulmonary disease) ([place])   . CAD (coronary artery disease)     AMI 1997 w/ BMS x 2 LAD, BMS CFX 2002, DES x 2 LAD 2009, DES CFX & OM 2012   . HTN (hypertension)   . Hyperlipidemia   . Peripheral vascular disease ([place])   . Prostate cancer ([place])   . Carotid artery disease ([place])     RICA CEA 4818, LICA CEA 5631 per Dr. [name],   . Myocardial infarction ([place])   . GERD (gastroesophageal reflux disease)     occ  . Anxiety   . PAC (premature atrial contraction)   . Pneumonia   .  Hydradenitis   . Ischemic cardiomyopathy     EF 40-45 percent at cath 2012  . Stroke [facility]) 11-24-11    "no feeling in right 5th digit"  . CHF (congestive heart failure) ([place])     pt denied in 2014  . Osteoarthritis     pt. denies 01/17/2015  . Shortness of breath dyspnea   . Urinary frequency   . Urinary urgency   . AAA (abdominal aortic aneurysm) ([facility])     4.2 cm IR AAA noted on 01/13/15 PET scan    Social History Social History  Substance Use Topics  . Smoking status: Current Every Day Smoker -- 0.75 packs/day for 55 years    Types: Cigarettes  . Smokeless tobacco: Never Used     Comment: 1/2 ppd (01/07/15)  . Alcohol Use: 0.0 oz/week    0 Standard drinks or equivalent per week     Comment: occ    Family History Family History  Problem Relation Age of Onset  . Stroke Father   . Heart disease Father   . Heart disease Mother     pacemaker  . Other Brother     benign brain tumor  . Cancer Brother     Eye-behind  . Liver cancer Brother   . Diabetes Maternal Grandmother   . Cancer Sister     Breast    Surgical History Past Surgical History  Procedure Laterality Date  .  Coronary stent placement  1997, 2002, 2009, 2012    8 stents total  . Doppler echocardiography  1995    neg  . Colonoscopy  04-2001    polyp  . Cardiovascular stress test  12-2006  . Cardiac catheterization    . Vascular surgery    . Endarterectomy  11/24/2011    Procedure: ENDARTERECTOMY CAROTID;  Surgeon: Rosetta Posner, MD;  Location: [place];  Service: Vascular;  Laterality: Left;  . Carotid endarterectomy Right 03-2000    CE  . Carotid endarterectomy Left 11-24-11    CE  . Prostate surgery      Radiation Tx  X's 40  . Video bronchoscopy with endobronchial navigation N/A 12/25/2014    Procedure: VIDEO BRONCHOSCOPY WITH ENDOBRONCHIAL NAVIGATION  with Biopsies and Brushings;  Surgeon: Collene Gobble, MD;  Location: [place];  Service: Thoracic;  Laterality: N/A;  . Fuducial placement N/A 12/25/2014     Procedure: PLACEMENT OF FUDUCIAL;  Surgeon: Collene Gobble, MD;  Location: [place];  Service: Thoracic;  Laterality: N/A;  . Eye surgery Bilateral Aug. 17, 2015    Cataract  . Video bronchoscopy with endobronchial ultrasound N/A 01/22/2015    Procedure: VIDEO BRONCHOSCOPY WITH ENDOBRONCHIAL ULTRASOUND with Biopsies;  Surgeon: Collene Gobble, MD;  Location: [facility];  Service: Thoracic;  Laterality: N/A;    Allergies  Allergen Reactions  . Augmentin [Amoxicillin-Pot Clavulanate] Nausea And Vomiting    Diarrhea  . Sulfonamide Derivatives     Pt do not remember 8-30 md  . Zantac [Ranitidine Hcl]     diarrhea    Current Outpatient Prescriptions  Medication Sig Dispense Refill  . albuterol (VENTOLIN HFA) 108 (90 BASE) MCG/ACT inhaler INHALE 2 PUFFS INTO THE LUNGS EVERY 4 (FOUR) HOURS AS NEEDED FOR WHEEZING OR SHORTNESS OF BREATH. 1 Inhaler 11  . ALPRAZolam (XANAX) 0.5 MG tablet Take 1 tablet (0.5 mg total) by mouth at bedtime as needed. Sleep or anxiety 30 tablet 0  . aspirin EC 81 MG tablet Take 81 mg by mouth daily.    Mar[place] Kitchen azithromycin (ZITHROMAX Z-PAK) 250 MG tablet Take 2 tablets (500 mg) on  Day 1,  followed by 1 tablet (250 mg) once daily on Days 2 through 5. 6 each 0  . clopidogrel (PLAVIX) 75 MG tablet Take 1 tablet (75 mg total) by mouth daily. You may restart this medication on 12/27/14    . Fluticasone-Salmeterol (ADVAIR DISKUS) 500-50 MCG/DOSE AEPB INHALE 1 PUFF INTO THE LUNGS EVERY 12 (TWELVE) HOURS. 60 each 11  . ipratropium-albuterol (DUONEB) 0.5-2.5 (3) MG/3ML SOLN INHALE CONTENTS OF 1 VIAL EVERY 4 HOURS AS NEEDED 180 mL 1  . loratadine (CLARITIN) 10 MG tablet TAKE 1 TABLET BY MOUTH EVERY DAY 30 tablet 5  . metoprolol tartrate (LOPRESSOR) 25 MG tablet Take 0.5 tablets (12.5 mg total) by mouth 2 (two) times daily. 30 tablet 11  . Multiple Vitamins-Minerals (MULTIVITAMIN WITH MINERALS) tablet Take 1 tablet by mouth daily.    . nitroGLYCERIN (NITROSTAT) 0.4 MG SL tablet Place 1 tablet  (0.4 mg total) under the tongue every 5 (five) minutes as needed. For chest pain 10 tablet 1  . pravastatin (PRAVACHOL) 40 MG tablet Take 1 tablet (40 mg total) by mouth daily. 30 tablet 11  . SPIRIVA HANDIHALER 18 MCG inhalation capsule INHALE 1 CAPSULE VIA HANDIHALER ONCE DAILY AT THE SAME TIME EVERY DAY 30 capsule 5  . Tamsulosin HCl (FLOMAX) 0.4 MG CAPS Take 0.4 mg by mouth daily.  No current facility-administered medications for this visit.    Review of Systems : See HPI for pertinent positives and negatives.  Physical Examination  Filed Vitals:   02/21/15 1317 02/21/15 1320  BP: 126/78 139/77  Pulse: 77 75  Temp:  98.2 F (36.8 C)  TempSrc:  Oral  Resp:  16  Height:  5' 9.75" (1.772 m)  Weight:  191 lb (86.637 kg)  SpO2:  98%   Body mass index is 27.59 kg/(m^2).  General: WDWN male in NAD  GAIT: normal  Eyes: PERRLA  Pulmonary: Limited air movement in all fields, no rales or rhonchi, & positive expiratory wheezing in posterior fields. Positive for chronic moist cough.  Cardiac: regular rhythm, no detected murmur, no peripheral edema.  VASCULAR EXAM  Carotid Bruits  Left  Right    Negative  Negative   Aorta is not palpable.  Bilateral radial pulses are palpable at 2+.   LE Pulses  LEFT  RIGHT   POPLITEAL  not palpable  not palpable   POSTERIOR TIBIAL  palpable  palpable   DORSALIS PEDIS  ANTERIOR TIBIAL  not palpable  not palpable    Gastrointestinal: soft, nontender, BS WNL, no r/g, no palpalbe masses.  Musculoskeletal: No muscle atrophy/wasting. M/S 5/5 throughout, extremities without ischemic changes.  Neurologic: A&O X 3; Appropriate Affect,  Speech is normal  CN 2-12 intact, Pain and light touch intact in extremities, Motor exam as listed above.         Non-Invasive Vascular Imaging CAROTID DUPLEX 02/21/2015   Widely patent bilateral carotid endarterectomy with minimal hyperplasia in the surgical bulb. Bilateral  vertebral artery is antegrade. No significant change compared to prior exam.   Assessment: [name] is a 74 y.o. male who is s/p right CEA in 2002 and left CEA in 2013. He had a TIA perioperatively in 2013 and states the numbness in his right 5th finger is about the same, no other neurological deficits. He denies any further stroke or TIA symptoms. Today's carotid duplex suggests widely patent bilateral carotid endarterectomy with minimal hyperplasia in the surgical bulb. Bilateral vertebral artery is antegrade. No significant change compared to prior exam. He was recently diagnosed with lung cancer and is undergoing radiation tx. Unfortunately he continues to smoke but has decreased to 1/2 ppd   Plan:  The patient was counseled re smoking cessation and given several free resources re smoking cessation.   Follow-up in 1 year with Carotid Duplex scan.   I discussed in depth with the patient the nature of atherosclerosis, and emphasized the importance of maximal medical management including strict control of blood pressure, blood glucose, and lipid levels, obtaining regular exercise, and cessation of smoking.  The patient is aware that without maximal medical management the underlying atherosclerotic disease process will progress, limiting the benefit of any interventions. The patient was given information about stroke prevention and what symptoms should prompt the patient to seek immediate medical care. Thank you for allowing [place] to participate in this patient's care.  Clemon Chambers, RN, MSN, FNP-C [facility] Office: [phone]  Clinic Physician: Bridgett Larsson  02/21/2015 1:24 PM

## 2015-02-21 NOTE — Patient Instructions (Addendum)
Stroke Prevention Some medical conditions and behaviors are associated with an increased chance of having a stroke. You may prevent a stroke by making healthy choices and managing medical conditions. HOW CAN I REDUCE MY RISK OF HAVING A STROKE?   Stay physically active. Get at least 30 minutes of activity on most or all days.  Do not smoke. It may also be helpful to avoid exposure to secondhand smoke.  Limit alcohol use. Moderate alcohol use is considered to be:  No more than 2 drinks per day for men.  No more than 1 drink per day for nonpregnant women.  Eat healthy foods. This involves:  Eating 5 or more servings of fruits and vegetables a day.  Making dietary changes that address high blood pressure (hypertension), high cholesterol, diabetes, or obesity.  Manage your cholesterol levels.  Making food choices that are high in fiber and low in saturated fat, trans fat, and cholesterol may control cholesterol levels.  Take any prescribed medicines to control cholesterol as directed by your health care provider.  Manage your diabetes.  Controlling your carbohydrate and sugar intake is recommended to manage diabetes.  Take any prescribed medicines to control diabetes as directed by your health care provider.  Control your hypertension.  Making food choices that are low in salt (sodium), saturated fat, trans fat, and cholesterol is recommended to manage hypertension.  Ask your health care provider if you need treatment to lower your blood pressure. Take any prescribed medicines to control hypertension as directed by your health care provider.  If you are 18-39 years of age, have your blood pressure checked every 3-5 years. If you are 40 years of age or older, have your blood pressure checked every year.  Maintain a healthy weight.  Reducing calorie intake and making food choices that are low in sodium, saturated fat, trans fat, and cholesterol are recommended to manage  weight.  Stop drug abuse.  Avoid taking birth control pills.  Talk to your health care provider about the risks of taking birth control pills if you are over 35 years old, smoke, get migraines, or have ever had a blood clot.  Get evaluated for sleep disorders (sleep apnea).  Talk to your health care provider about getting a sleep evaluation if you snore a lot or have excessive sleepiness.  Take medicines only as directed by your health care provider.  For some people, aspirin or blood thinners (anticoagulants) are helpful in reducing the risk of forming abnormal blood clots that can lead to stroke. If you have the irregular heart rhythm of atrial fibrillation, you should be on a blood thinner unless there is a good reason you cannot take them.  Understand all your medicine instructions.  Make sure that other conditions (such as anemia or atherosclerosis) are addressed. SEEK IMMEDIATE MEDICAL CARE IF:   You have sudden weakness or numbness of the face, arm, or leg, especially on one side of the body.  Your face or eyelid droops to one side.  You have sudden confusion.  You have trouble speaking (aphasia) or understanding.  You have sudden trouble seeing in one or both eyes.  You have sudden trouble walking.  You have dizziness.  You have a loss of balance or coordination.  You have a sudden, severe headache with no known cause.  You have new chest pain or an irregular heartbeat. Any of these symptoms may represent a serious problem that is an emergency. Do not wait to see if the symptoms will   go away. Get medical help at once. Call your local emergency services (911 in [place] Do not drive yourself to the hospital.   This information is not intended to replace advice given to you by your health care provider. Make sure you discuss any questions you have with your health care provider.   Document Released: 04/15/2004 Document Revised: 03/29/2014 Document Reviewed:  09/08/2012 Elsevier Interactive Patient Education 2016 [employer]    Smoking Cessation, Tips for Success If you are ready to quit smoking, congratulations! You have chosen to help yourself be healthier. Cigarettes bring nicotine, tar, carbon monoxide, and other irritants into your body. Your lungs, heart, and blood vessels will be able to work better without these poisons. There are many different ways to quit smoking. Nicotine gum, nicotine patches, a nicotine inhaler, or nicotine nasal spray can help with physical craving. Hypnosis, support groups, and medicines help break the habit of smoking. WHAT THINGS CAN I DO TO MAKE QUITTING EASIER?  Here are some tips to help you quit for good:  Pick a date when you will quit smoking completely. Tell all of your friends and family about your plan to quit on that date.  Do not try to slowly cut down on the number of cigarettes you are smoking. Pick a quit date and quit smoking completely starting on that day.  Throw away all cigarettes.   Clean and remove all ashtrays from your home, work, and car.  On a card, write down your reasons for quitting. Carry the card with you and read it when you get the urge to smoke.  Cleanse your body of nicotine. Drink enough water and fluids to keep your urine clear or pale yellow. Do this after quitting to flush the nicotine from your body.  Learn to predict your moods. Do not let a bad situation be your excuse to have a cigarette. Some situations in your life might tempt you into wanting a cigarette.  Never have "just one" cigarette. It leads to wanting another and another. Remind yourself of your decision to quit.  Change habits associated with smoking. If you smoked while driving or when feeling stressed, try other activities to replace smoking. Stand up when drinking your coffee. Brush your teeth after eating. Sit in a different chair when you read the paper. Avoid alcohol while trying to quit, and try to  drink fewer caffeinated beverages. Alcohol and caffeine may urge you to smoke.  Avoid foods and drinks that can trigger a desire to smoke, such as sugary or spicy foods and alcohol.  Ask people who smoke not to smoke around you.  Have something planned to do right after eating or having a cup of coffee. For example, plan to take a walk or exercise.  Try a relaxation exercise to calm you down and decrease your stress. Remember, you may be tense and nervous for the first 2 weeks after you quit, but this will pass.  Find new activities to keep your hands busy. Play with a pen, coin, or rubber band. Doodle or draw things on paper.  Brush your teeth right after eating. This will help cut down on the craving for the taste of tobacco after meals. You can also try mouthwash.   Use oral substitutes in place of cigarettes. Try using lemon drops, carrots, cinnamon sticks, or chewing gum. Keep them handy so they are available when you have the urge to smoke.  When you have the urge to smoke, try deep breathing.    Designate your home as a nonsmoking area.  If you are a heavy smoker, ask your health care provider about a prescription for nicotine chewing gum. It can ease your withdrawal from nicotine.  Reward yourself. Set aside the cigarette money you save and buy yourself something nice.  Look for support from others. Join a support group or smoking cessation program. Ask someone at home or at work to help you with your plan to quit smoking.  Always ask yourself, "Do I need this cigarette or is this just a reflex?" Tell yourself, "Today, I choose not to smoke," or "I do not want to smoke." You are reminding yourself of your decision to quit.  Do not replace cigarette smoking with electronic cigarettes (commonly called e-cigarettes). The safety of e-cigarettes is unknown, and some may contain harmful chemicals.  If you relapse, do not give up! Plan ahead and think about what you will do the next  time you get the urge to smoke. HOW WILL I FEEL WHEN I QUIT SMOKING? You may have symptoms of withdrawal because your body is used to nicotine (the addictive substance in cigarettes). You may crave cigarettes, be irritable, feel very hungry, cough often, get headaches, or have difficulty concentrating. The withdrawal symptoms are only temporary. They are strongest when you first quit but will go away within 10-14 days. When withdrawal symptoms occur, stay in control. Think about your reasons for quitting. Remind yourself that these are signs that your body is healing and getting used to being without cigarettes. Remember that withdrawal symptoms are easier to treat than the major diseases that smoking can cause.  Even after the withdrawal is over, expect periodic urges to smoke. However, these cravings are generally short lived and will go away whether you smoke or not. Do not smoke! WHAT RESOURCES ARE AVAILABLE TO HELP ME QUIT SMOKING? Your health care provider can direct you to community resources or hospitals for support, which may include:  Group support.  Education.  Hypnosis.  Therapy.   This information is not intended to replace advice given to you by your health care provider. Make sure you discuss any questions you have with your health care provider.   Document Released: 12/05/2003 Document Revised: 03/29/2014 Document Reviewed: 08/24/2012 Elsevier Interactive Patient Education 2016 [employer]   Steps to Quit Smoking  Smoking tobacco can be harmful to your health and can affect almost every organ in your body. Smoking puts you, and those around you, at risk for developing many serious chronic diseases. Quitting smoking is difficult, but it is one of the best things that you can do for your health. It is never too late to quit. WHAT ARE THE BENEFITS OF QUITTING SMOKING? When you quit smoking, you lower your risk of developing serious diseases and conditions, such as:  Lung  cancer or lung disease, such as COPD.  Heart disease.  Stroke.  Heart attack.  Infertility.  Osteoporosis and bone fractures. Additionally, symptoms such as coughing, wheezing, and shortness of breath may get better when you quit. You may also find that you get sick less often because your body is stronger at fighting off colds and infections. If you are pregnant, quitting smoking can help to reduce your chances of having a baby of low birth weight. HOW DO I GET READY TO QUIT? When you decide to quit smoking, create a plan to make sure that you are successful. Before you quit:  Pick a date to quit. Set a date within the   next two weeks to give you time to prepare.  Write down the reasons why you are quitting. Keep this list in places where you will see it often, such as on your bathroom mirror or in your car or wallet.  Identify the people, places, things, and activities that make you want to smoke (triggers) and avoid them. Make sure to take these actions:  Throw away all cigarettes at home, at work, and in your car.  Throw away smoking accessories, such as ashtrays and lighters.  Clean your car and make sure to empty the ashtray.  Clean your home, including curtains and carpets.  Tell your family, friends, and coworkers that you are quitting. Support from your loved ones can make quitting easier.  Talk with your health care provider about your options for quitting smoking.  Find out what treatment options are covered by your health insurance. WHAT STRATEGIES CAN I USE TO QUIT SMOKING?  Talk with your healthcare provider about different strategies to quit smoking. Some strategies include:  Quitting smoking altogether instead of gradually lessening how much you smoke over a period of time. Research shows that quitting "cold turkey" is more successful than gradually quitting.  Attending in-person counseling to help you build problem-solving skills. You are more likely to have  success in quitting if you attend several counseling sessions. Even short sessions of 10 minutes can be effective.  Finding resources and support systems that can help you to quit smoking and remain smoke-free after you quit. These resources are most helpful when you use them often. They can include:  Online chats with a counselor.  Telephone quitlines.  Printed self-help materials.  Support groups or group counseling.  Text messaging programs.  Mobile phone applications.  Taking medicines to help you quit smoking. (If you are pregnant or breastfeeding, talk with your health care provider first.) Some medicines contain nicotine and some do not. Both types of medicines help with cravings, but the medicines that include nicotine help to relieve withdrawal symptoms. Your health care provider may recommend:  Nicotine patches, gum, or lozenges.  Nicotine inhalers or sprays.  Non-nicotine medicine that is taken by mouth. Talk with your health care provider about combining strategies, such as taking medicines while you are also receiving in-person counseling. Using these two strategies together makes you more likely to succeed in quitting than if you used either strategy on its own. If you are pregnant or breastfeeding, talk with your health care provider about finding counseling or other support strategies to quit smoking. Do not take medicine to help you quit smoking unless told to do so by your health care provider. WHAT THINGS CAN I DO TO MAKE IT EASIER TO QUIT? Quitting smoking might feel overwhelming at first, but there is a lot that you can do to make it easier. Take these important actions:  Reach out to your family and friends and ask that they support and encourage you during this time. Call telephone quitlines, reach out to support groups, or work with a counselor for support.  Ask people who smoke to avoid smoking around you.  Avoid places that trigger you to smoke, such as bars,  parties, or smoke-break areas at work.  Spend time around people who do not smoke.  Lessen stress in your life, because stress can be a smoking trigger for some people. To lessen stress, try:  Exercising regularly.  Deep-breathing exercises.  Yoga.  Meditating.  Performing a body scan. This involves closing your eyes, scanning   your body from head to toe, and noticing which parts of your body are particularly tense. Purposefully relax the muscles in those areas.  Download or purchase mobile phone or tablet apps (applications) that can help you stick to your quit plan by providing reminders, tips, and encouragement. There are many free apps, such as QuitGuide from the CDC (Centers for Disease Control and Prevention). You can find other support for quitting smoking (smoking cessation) through smokefree.gov and other websites. HOW WILL I FEEL WHEN I QUIT SMOKING? Within the first 24 hours of quitting smoking, you may start to feel some withdrawal symptoms. These symptoms are usually most noticeable 2-3 days after quitting, but they usually do not last beyond 2-3 weeks. Changes or symptoms that you might experience include:  Mood swings.  Restlessness, anxiety, or irritation.  Difficulty concentrating.  Dizziness.  Strong cravings for sugary foods in addition to nicotine.  Mild weight gain.  Constipation.  Nausea.  Coughing or a sore throat.  Changes in how your medicines work in your body.  A depressed mood.  Difficulty sleeping (insomnia). After the first 2-3 weeks of quitting, you may start to notice more positive results, such as:  Improved sense of smell and taste.  Decreased coughing and sore throat.  Slower heart rate.  Lower blood pressure.  Clearer skin.  The ability to breathe more easily.  Fewer sick days. Quitting smoking is very challenging for most people. Do not get discouraged if you are not successful the first time. Some people need to make many  attempts to quit before they achieve long-term success. Do your best to stick to your quit plan, and talk with your health care provider if you have any questions or concerns.   This information is not intended to replace advice given to you by your health care provider. Make sure you discuss any questions you have with your health care provider.   Document Released: 03/02/2001 Document Revised: 07/23/2014 Document Reviewed: 07/23/2014 Elsevier Interactive Patient Education 2016 [employer] Inc[employer]   

## 2015-02-21 NOTE — Telephone Encounter (Signed)
Oncology Nurse Navigator Documentation  Oncology Nurse Navigator Flowsheets 02/21/2015  Navigator Encounter Type Telephone/I called to follow up with [name] today.  I left vm message for him to call with my name and phone number  Patient Visit Type Follow-up  Treatment Phase Other  Interventions -  Coordination of Care -  Time Spent with Patient 15

## 2015-02-25 ENCOUNTER — Ambulatory Visit
Admission: RE | Admit: 2015-02-25 | Discharge: 2015-02-25 | Disposition: A | Discharge status: Home / Self Care | Payer: Medicare Other | Source: Ambulatory Visit | Attending: Radiation Oncology | Admitting: Radiation Oncology

## 2015-02-25 DIAGNOSIS — Z510 Encounter for antineoplastic radiation therapy: Secondary | ICD-10-CM | POA: Diagnosis not present

## 2015-02-25 DIAGNOSIS — C3411 Malignant neoplasm of upper lobe, right bronchus or lung: Secondary | ICD-10-CM

## 2015-02-25 NOTE — Progress Notes (Signed)
  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   He had no ill effects from treatment. He was treated by pulmonology for pneumonia during the course of treatment.   Plan: The patient has completed radiation treatment. The patient will return to radiation oncology clinic for routine followup in one month. I advised them to call or return sooner if they have any questions or concerns related to their recovery or treatment.  ------------------------------------------------  Thea Silversmith, MD

## 2015-02-25 NOTE — Progress Notes (Addendum)
Mr. Beier completes SBRT he notes increased coughing since starting treatment.  He just completed a 5 day course of antibiotics for cough and "green expectorant.  VSS.  BP 131/72 mmHg  Pulse 88  Temp(Src) 97.6 F (36.4 C)  Ht 5' 9.75" (1.772 m)  Wt 190 lb 6.4 oz (86.365 kg)  BMI 27.50 kg/m2  SpO2 100%  .

## 2015-03-03 ENCOUNTER — Ambulatory Visit (INDEPENDENT_AMBULATORY_CARE_PROVIDER_SITE_OTHER): Payer: Medicare Other | Admitting: Cardiovascular Disease

## 2015-03-03 ENCOUNTER — Encounter: Payer: Self-pay | Admitting: Cardiovascular Disease

## 2015-03-03 VITALS — BP 110/70 | HR 86 | Ht 69.0 in | Wt 187.7 lb

## 2015-03-03 DIAGNOSIS — Z720 Tobacco use: Secondary | ICD-10-CM

## 2015-03-03 DIAGNOSIS — I6523 Occlusion and stenosis of bilateral carotid arteries: Secondary | ICD-10-CM

## 2015-03-03 DIAGNOSIS — E785 Hyperlipidemia, unspecified: Secondary | ICD-10-CM

## 2015-03-03 DIAGNOSIS — I2510 Atherosclerotic heart disease of native coronary artery without angina pectoris: Secondary | ICD-10-CM

## 2015-03-03 MED ORDER — ALPRAZOLAM 0.5 MG PO TABS: 0.5000 mg | ORAL_TABLET | Freq: Every evening | ORAL | Status: DC | PRN

## 2015-03-03 MED ORDER — NITROGLYCERIN 0.4 MG SL SUBL: 0.4000 mg | SUBLINGUAL_TABLET | SUBLINGUAL | Status: DC | PRN

## 2015-03-03 NOTE — Progress Notes (Signed)
Chief Complaint  Patient presents with  . Follow-up    cad/bilateral coritid artery disease     History of Present Illness: 74 yo male with history of CAD, COPD, HTN, HLD, GERD, PAD, carotid artery disease who is here today for cardiac follow up. His cardiac history is significant for anterior MI in 1997 tx'd with bare metal stent x 2 to the LAD, bare metal stenting to the CFX in 2002 and PCI in 2009 with placement of 2 separate drug eluting stents in the LAD.  Cardiac cath on 04/14/10 with severe stenosis in the distal AV groove Circumflex and the OM. These were both treated with DES. His carotid artery dopplers 12/02/12 in [facility] office showed mild disease in RICA at CEA site and mild disease in the LICA CES site. He is followed by Dr. Lamonte Sakai for COPD. He stopped Losartan 2014 due to dizziness and hypotension. He had c/o chest pain at his visit here in March 2015. I arranged a stress test but he cancelled this test. He was seen in follow up 12/10/13 and his chest pain had resolved. 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   He is here for follow up. He denies chest pain or change in breathing. Still smoking. He is done with XRT.   Primary Care Physician: [place]    Past Medical History  Diagnosis Date  . COPD (chronic obstructive pulmonary disease) ([place])   . CAD (coronary artery disease)     AMI 1997 w/ BMS x 2 LAD, BMS CFX 2002, DES x 2 LAD 2009, DES CFX & OM 2012   . HTN (hypertension)   . Hyperlipidemia   . Peripheral vascular disease ([place])   . Prostate cancer ([place])   . Carotid artery disease ([place])     RICA CEA 8938, LICA CEA 1017 per Dr. [name],   . Myocardial infarction ([place])   . GERD (gastroesophageal reflux disease)     occ  . Anxiety   . PAC (premature atrial contraction)   . Pneumonia   . Hydradenitis   . Ischemic cardiomyopathy     EF 40-45 percent at cath 2012  . Stroke [facility]) 11-24-11    "no feeling in right 5th digit"  . CHF  (congestive heart failure) ([place])     pt denied in 2014  . Osteoarthritis     pt. denies 01/17/2015  . Shortness of breath dyspnea   . Urinary frequency   . Urinary urgency   . AAA (abdominal aortic aneurysm) ([facility])     4.2 cm IR AAA noted on 01/13/15 PET scan    Past Surgical History  Procedure Laterality Date  . Coronary stent placement  1997, 2002, 2009, 2012    8 stents total  . Doppler echocardiography  1995    neg  . Colonoscopy  04-2001    polyp  . Cardiovascular stress test  12-2006  . Cardiac catheterization    . Vascular surgery    . Endarterectomy  11/24/2011    Procedure: ENDARTERECTOMY CAROTID;  Surgeon: Rosetta Posner, MD;  Location: [facility];  Service: Vascular;  Laterality: Left;  . Carotid endarterectomy Right 03-2000    CE  . Carotid endarterectomy Left 11-24-11    CE  . Prostate surgery      Radiation Tx  X's 40  . Video bronchoscopy with endobronchial navigation N/A 12/25/2014    Procedure: VIDEO BRONCHOSCOPY WITH ENDOBRONCHIAL NAVIGATION  with Biopsies and  Brushings;  Surgeon: Collene Gobble, MD;  Location: [facility] OR;  Service: Thoracic;  Laterality: N/A;  . Fuducial placement N/A 12/25/2014    Procedure: PLACEMENT OF FUDUCIAL;  Surgeon: Collene Gobble, MD;  Location: [facility];  Service: Thoracic;  Laterality: N/A;  . Eye surgery Bilateral Aug. 17, 2015    Cataract  . Video bronchoscopy with endobronchial ultrasound N/A 01/22/2015    Procedure: VIDEO BRONCHOSCOPY WITH ENDOBRONCHIAL ULTRASOUND with Biopsies;  Surgeon: Collene Gobble, MD;  Location: [facility] OR;  Service: Thoracic;  Laterality: N/A;    Current Outpatient Prescriptions  Medication Sig Dispense Refill  . albuterol (VENTOLIN HFA) 108 (90 BASE) MCG/ACT inhaler INHALE 2 PUFFS INTO THE LUNGS EVERY 4 (FOUR) HOURS AS NEEDED FOR WHEEZING OR SHORTNESS OF BREATH. 1 Inhaler 11  . ALPRAZolam (XANAX) 0.5 MG tablet Take 1 tablet (0.5 mg total) by mouth at bedtime as needed. Sleep or anxiety 30 tablet 1  . aspirin EC 81 MG tablet  Take 81 mg by mouth daily.    . clopidogrel (PLAVIX) 75 MG tablet Take 1 tablet (75 mg total) by mouth daily. You may restart this medication on 12/27/14    . Fluticasone-Salmeterol (ADVAIR DISKUS) 500-50 MCG/DOSE AEPB INHALE 1 PUFF INTO THE LUNGS EVERY 12 (TWELVE) HOURS. 60 each 11  . ipratropium-albuterol (DUONEB) 0.5-2.5 (3) MG/3ML SOLN INHALE CONTENTS OF 1 VIAL EVERY 4 HOURS AS NEEDED 180 mL 1  . loratadine (CLARITIN) 10 MG tablet TAKE 1 TABLET BY MOUTH EVERY DAY 30 tablet 5  . metoprolol tartrate (LOPRESSOR) 25 MG tablet Take 0.5 tablets (12.5 mg total) by mouth 2 (two) times daily. 30 tablet 11  . Multiple Vitamins-Minerals (MULTIVITAMIN WITH MINERALS) tablet Take 1 tablet by mouth daily.    . nitroGLYCERIN (NITROSTAT) 0.4 MG SL tablet Place 1 tablet (0.4 mg total) under the tongue every 5 (five) minutes as needed. For chest pain 25 tablet 6  . pravastatin (PRAVACHOL) 40 MG tablet Take 1 tablet (40 mg total) by mouth daily. 30 tablet 11  . SPIRIVA HANDIHALER 18 MCG inhalation capsule INHALE 1 CAPSULE VIA HANDIHALER ONCE DAILY AT THE SAME TIME EVERY DAY 30 capsule 5  . Tamsulosin HCl (FLOMAX) 0.4 MG CAPS Take 0.4 mg by mouth daily.      No current facility-administered medications for this visit.    Allergies  Allergen Reactions  . Augmentin [Amoxicillin-Pot Clavulanate] Nausea And Vomiting    Diarrhea  . Sulfonamide Derivatives Other (See Comments)    Pt do not remember 8-30 md  . Zantac [Ranitidine Hcl] Diarrhea    Social History   Social History  . Marital Status: Widowed    Spouse Name: N/A  . Number of Children: 5  . Years of Education: N/A   Occupational History  . Retired Airline pilot   . Part time, delivering auto parts    Social History Main Topics  . Smoking status: Current Every Day Smoker -- 0.75 packs/day for 55 years    Types: Cigarettes  . Smokeless tobacco: Never Used     Comment: 1/2 ppd (01/07/15)  . Alcohol Use: 0.0 oz/week    0 Standard drinks or  equivalent per week     Comment: occ  . Drug Use: No  . Sexual Activity: Not on file   Other Topics Concern  . Not on file   Social History Narrative    Family History  Problem Relation Age of Onset  . Stroke Father   . Heart disease Father   .  Heart disease Mother     pacemaker  . Other Brother     benign brain tumor  . Cancer Brother     Eye-behind  . Liver cancer Brother   . Diabetes Maternal Grandmother   . Cancer Sister     Breast    Review of Systems:  As stated in the HPI and otherwise negative.   BP 110/70 mmHg  Pulse 86  Ht '5\' 9"'$  (1.753 m)  Wt 187 lb 11.2 oz (85.14 kg)  BMI 27.71 kg/m2  SpO2 97%  Physical Examination: General: Well developed, well nourished, NAD HEENT: OP clear, mucus membranes moist SKIN: warm, dry. No rashes. Neuro: No focal deficits Musculoskeletal: Muscle strength 5/5 all ext Psychiatric: Mood and affect normal Neck: No JVD, no carotid bruits, no thyromegaly, no lymphadenopathy. Lungs:Clear bilaterally, no wheezes, rhonci, crackles Cardiovascular: Regular rate and rhythm. No murmurs, gallops or rubs. Abdomen:Soft. Bowel sounds present. Non-tender.  Extremities: No lower extremity edema. Pulses are 2 + in the bilateral DP/PT.  EKG:  EKG is not ordered today. The ekg ordered today demonstrates   Recent Labs: 10/30/2014: TSH 1.34 12/16/2014: ALT 14* 01/17/2015: BUN 11; Creatinine, Ser 1.02; Hemoglobin 14.0; Platelets 271; Potassium 3.8; Sodium 133*   Lipid Panel    Component Value Date/Time   CHOL 157 10/30/2014 0822   TRIG 56.0 10/30/2014 0822   HDL 44.50 10/30/2014 0822   CHOLHDL 4 10/30/2014 0822   VLDL 11.2 10/30/2014 0822   LDLCALC 102* 10/30/2014 0822     Wt Readings from Last 3 Encounters:  03/03/15 187 lb 11.2 oz (85.14 kg)  02/25/15 190 lb 6.4 oz (86.365 kg)  02/21/15 191 lb (86.637 kg)     Other studies Reviewed: Additional studies/ records that were reviewed today include: . Review of the above records  demonstrates:    Assessment and Plan:   1. CAD:  Stable. No chest pain. He is on good medical therapy. Will continue dual anti-platelet therapy with ASA and Plavix since has multiple stents.  Continue statin and beta blocker.      2. Carotid artery disease: Stable s/p bilateral CEA. Followed by Dr. [name] in [facility].   3. Tobacco abuse: Smoking cessation encouraged. He is trying to stop smoking.   4. Hyperlipidemia: Continue statin. LDL goal is below 100. He does not tolerate high doses of statins but is tolerating Pravastatin.   Current medicines are reviewed at length with the patient today.  The patient does not have concerns regarding medicines.  The following changes have been made:  no change  Labs/ tests ordered today include:  No orders of the defined types were placed in this encounter.    Disposition:   FU with me in 12  months  Signed, Lauree Chandler, MD 03/03/2015 9:10 AM    [facility] [address], [place], [place]  [address] Phone: [phone]; Fax: [phone]

## 2015-03-03 NOTE — Patient Instructions (Signed)

## 2015-03-11 ENCOUNTER — Encounter: Payer: Self-pay | Admitting: Radiation Oncology

## 2015-03-11 NOTE — Progress Notes (Signed)
Paperwork received from nurse 12/20, patient advised not to fax out nothing else needed he would come and pick up on his next appointment in [place])

## 2015-03-16 ENCOUNTER — Other Ambulatory Visit: Payer: Self-pay | Admitting: Emergency Medicine

## 2015-03-26 ENCOUNTER — Telehealth: Payer: Self-pay | Admitting: *Deleted

## 2015-03-26 ENCOUNTER — Ambulatory Visit
Admission: RE | Admit: 2015-03-26 | Discharge: 2015-03-26 | Disposition: A | Discharge status: Home / Self Care | Payer: Medicare Other | Source: Ambulatory Visit | Attending: Radiation Oncology | Admitting: Radiation Oncology

## 2015-03-26 ENCOUNTER — Encounter: Payer: Self-pay | Admitting: Radiation Oncology

## 2015-03-26 VITALS — BP 133/71 | HR 92 | Temp 97.6°F | Ht 69.0 in | Wt 189.3 lb

## 2015-03-26 DIAGNOSIS — C3411 Malignant neoplasm of upper lobe, right bronchus or lung: Secondary | ICD-10-CM | POA: Insufficient documentation

## 2015-03-26 DIAGNOSIS — Z8546 Personal history of malignant neoplasm of prostate: Secondary | ICD-10-CM | POA: Diagnosis not present

## 2015-03-26 LAB — RENAL FUNCTION PANEL
ALBUMIN: 4 g/dL (ref 3.6–5.1)
BUN: 16 mg/dL (ref 7–25)
CALCIUM: 9.1 mg/dL (ref 8.6–10.3)
CO2: 26 mmol/L (ref 20–31)
Chloride: 104 mmol/L (ref 98–110)
Creatinine, Ser: 1.26 mg/dL — ABNORMAL HIGH (ref 0.70–1.18)
GLUCOSE: 94 mg/dL (ref 65–99)
PHOSPHORUS: 2.5 mg/dL (ref 2.1–4.3)
POTASSIUM: 3.9 mmol/L (ref 3.5–5.3)
SODIUM: 139 mmol/L (ref 135–146)

## 2015-03-26 NOTE — Progress Notes (Signed)
   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 He is doing well and continues to smoke. He has noted no change in breathing status. He has follow up appointment with Dr.Byrum 04/16/14. He is due for chest imaging.   [name] is here today for his one month F/U visit. Denies any pain or discomfort to right chest. Has a rash to right lower side, itching using a skin care cream does not know the name of the product; the rash was present off and on before his radiation. Energy level is low. Appetite is good. Using breathing inhalers per order instructions O2 saturation 100 % today, no swallowing problems and denies chest pain.  Physical Exam:  Filed Vitals:   03/26/15 1048  BP: 133/71  Pulse: 92  Temp: 97.6 F (36.4 C)  TempSrc: Oral  Height: '5\' 9"'$  (1.753 m)  Weight: 189 lb 4.8 oz (85.866 kg)  SpO2: 100%    Lab Results  Component Value Date   CREATININE 1.02 01/17/2015   CREATININE 1.07 12/16/2014   CREATININE 1.09 10/30/2014    IMPRESSION: [name] is a 75 y.o. male T1N0 NSCLC of upper lobe s/p SBRT with resolving acute radiation effects.  PLAN:  I will order a scan in the next couple weeks prior to visit with Dr.Byrum. Then I will see him back in May with a CT of chest at that time.   I have ordered a CT with contrast and renal function testing.   Advised patient to use hydrocortisone cream for his rash.   ------------------------------------------------  Thea Silversmith, MD    This document serves as a  record of services personally performed by Thea Silversmith, MD. It was created on her behalf by Derek Mound, a trained medical scribe. The creation of this record is based on the scribe's personal observations and the provider's statements to them. This document has been checked and approved by the attending provider.

## 2015-03-26 NOTE — Telephone Encounter (Signed)
CALLED PATIENT TO INFORM OF CT ON 08-13-15 AND HIS FU WITH DR. Pablo Ledger TO GET HIS RESULTS ON 08-14-15, SPOKE WITH PATIENT AND HE IS AWARE OF THESE APPTS.

## 2015-03-26 NOTE — Telephone Encounter (Signed)
CALLED PATIENT TO INFORM OF CT ON 04-15-15 - ARRIVAL TIME - 6:45 AM @ [facility], NPO 4 HRS. PRIOR TO TEST, LVM FOR A RETURN CALL

## 2015-03-26 NOTE — Progress Notes (Signed)
[name] is here today for his one month F/U visit.   Denies any pain or discomfort to right chest.   Has a rash to right lower side  , itching using a skin care cream does not know the name of the product; the rash was present off and on before his radiation.  Energy level is low.  Appetite is good.  Using breathing inhalers per order instructions O 2 sat 100 % today, no swallowing problems and denies chest pain.  BP 133/71 mmHg  Pulse 92  Temp(Src) 97.6 F (36.4 C) (Oral)  Ht '5\' 9"'$  (1.753 m)  Wt 189 lb 4.8 oz (85.866 kg)  BMI 27.94 kg/m2  SpO2 100% Wt Readings from Last 3 Encounters:  03/26/15 189 lb 4.8 oz (85.866 kg)  03/03/15 187 lb 11.2 oz (85.14 kg)  02/25/15 190 lb 6.4 oz (86.365 kg)

## 2015-03-27 ENCOUNTER — Ambulatory Visit: Payer: Self-pay | Admitting: Radiation Oncology

## 2015-04-07 DIAGNOSIS — Z8546 Personal history of malignant neoplasm of prostate: Secondary | ICD-10-CM | POA: Diagnosis not present

## 2015-04-07 DIAGNOSIS — Z0000 Encounter for general adult medical examination without abnormal findings: Secondary | ICD-10-CM | POA: Diagnosis not present

## 2015-04-15 ENCOUNTER — Ambulatory Visit (HOSPITAL_COMMUNITY)
Admission: RE | Admit: 2015-04-15 | Discharge: 2015-04-15 | Disposition: A | Discharge status: Home / Self Care | Payer: Medicare Other | Source: Ambulatory Visit | Attending: Radiation Oncology | Admitting: Radiation Oncology

## 2015-04-15 ENCOUNTER — Encounter (HOSPITAL_COMMUNITY): Payer: Self-pay

## 2015-04-15 DIAGNOSIS — I289 Disease of pulmonary vessels, unspecified: Secondary | ICD-10-CM | POA: Insufficient documentation

## 2015-04-15 DIAGNOSIS — C3490 Malignant neoplasm of unspecified part of unspecified bronchus or lung: Secondary | ICD-10-CM | POA: Diagnosis not present

## 2015-04-15 DIAGNOSIS — Z923 Personal history of irradiation: Secondary | ICD-10-CM | POA: Insufficient documentation

## 2015-04-15 DIAGNOSIS — R590 Localized enlarged lymph nodes: Secondary | ICD-10-CM | POA: Diagnosis not present

## 2015-04-15 DIAGNOSIS — I2510 Atherosclerotic heart disease of native coronary artery without angina pectoris: Secondary | ICD-10-CM | POA: Insufficient documentation

## 2015-04-15 DIAGNOSIS — C3411 Malignant neoplasm of upper lobe, right bronchus or lung: Secondary | ICD-10-CM | POA: Insufficient documentation

## 2015-04-15 HISTORY — DX: Malignant neoplasm of unspecified part of unspecified bronchus or lung: C34.90

## 2015-04-15 MED ORDER — IOHEXOL 300 MG/ML  SOLN
75.0000 mL | Freq: Once | INTRAMUSCULAR | Status: AC | PRN
  Administered 2015-04-15: 75 mL via INTRAVENOUS

## 2015-04-17 ENCOUNTER — Encounter: Payer: Self-pay | Admitting: Emergency Medicine

## 2015-04-17 ENCOUNTER — Ambulatory Visit (INDEPENDENT_AMBULATORY_CARE_PROVIDER_SITE_OTHER): Payer: Medicare Other | Admitting: Emergency Medicine

## 2015-04-17 VITALS — BP 132/98 | HR 79 | Ht 69.0 in | Wt 186.0 lb

## 2015-04-17 DIAGNOSIS — J449 Chronic obstructive pulmonary disease, unspecified: Secondary | ICD-10-CM

## 2015-04-17 DIAGNOSIS — C3411 Malignant neoplasm of upper lobe, right bronchus or lung: Secondary | ICD-10-CM

## 2015-04-17 MED ORDER — TIOTROPIUM BROMIDE-OLODATEROL 2.5-2.5 MCG/ACT IN AERS: 2.0000 | INHALATION_SPRAY | Freq: Every day | RESPIRATORY_TRACT | Status: DC

## 2015-04-17 NOTE — Assessment & Plan Note (Signed)
Most recent CT scan was on 04/15/15. This showed that his mediastinal lymphadenopathy and also his right upper lobe nodule are shrinking following treatment. There are also 2 small satellite nodules noted in the right upper lobe that I did not see before. Significance of these is unclear but we will need to follow closely to ensure that this does not represent active malignancy. There were small enough that it is not clear to me there is a role for biopsy or empiric radiation or chemotherapy at this time. I suspect that he will need to have a follow-up film to look for interval change.

## 2015-04-17 NOTE — Progress Notes (Signed)
Subjective:  Patient ID: [name], male    DOB: Sep 26, 1940, 75 y.o.   MRN: [id number] HPI 75 yo smoker (~100pk-yrs), hx of CAD/PTCI, HTN,  prostate CA. Dx with COPD around 10 yrs ago. Last seen 2007 by PW. Maintanance was Advair bid. Previously on Spiriva, stopped in 2008.   ROV 04/06/13 -- severe COPD, seen for an AE in mid December. He got better on pred, then a little more cough. Still smoking. Returns today describing cough and mucous, not as bad as the exacerbation.    ROV 06/04/13 -- severe COPD, also CAD. He reports today that he has had some vertigo that feels like spinning. Happens when he changes position. He has had this before several yrs ago. He is on advair + spiriva. He has had some episodes of dyspnea, had to use his neb x 2 last week. Otherwise doing well. He is still smoking 0.5 pk a day.   05/31/14 Acute OV : severe COPD  Presents for an acute office visit.  Complains of productive cough with pale green mucus, increased SOB with wheezing and chest tightness x 2 weeks.  Denies f/c/s, n/v, hemoptysis and edema  No recnet abx . No ER visit.  No otc used.  >>Levaquin and pred taper   Follow up : Severe COPD 06/28/14 Patient returns for a three-week follow-up. Patient was seen in the office 3 weeks ago for COPD exacerbation. He was treated with Levaquin and a prednisone taper. Patient is feeling much improved with decreased cough and wheezing. Has minimally productive with white mucus.  He does continue smoke. We discussed smoking cessation. Chest x-ray last visit. Head showed a right upper lobe nodule. Repeat chest x-ray today shows a persistent questionable pulmonary nodule in the right upper lobe. No weight loss or hemoptysis  He denies any chest pain, orthopnea, PND, or increased leg swelling  ROV 07/18/14 -- follow-up visit for severe COPD, continued tobacco use. He underwent a chest x-ray 06/28/14 that showed a right upper lobe nodule. A subsequent CT scan of the chest  performed 4/14 showed a 12 mm cavitary right upper lobe nodule without any evidence of associated lymphadenopathy. PET scan performed 07/18/14 shows malignant range hypermetabolism in the right upper lobe nodule without any clear associated hilar or mediastinal adenopathy. No TB exposure, no real cough now. No wt loss - he has actually gained some.    ROV 10/17/14 -- follow-up visit for severe COPD. He continues to smoke. We have been following a right upper lobe 12 mm nodule that is hypermetabolic on PET scan performed 07/18/14. He underwent a repeat CT chest on 10/03/14 that I have personally reviewed.  This shows an interval increase in size in his right upper lobe cavitary nodule which now measures 1.5 x 1.5 cm. He also has stable left upper lobe nodules.  He continues to work 10 days out of the month.  He continues to smoke about 3/4 a pk a day.  He is followed for his CAD by Dr Glennie Hawk, last stent was in 2012.  He reports increased cough and some change in color his sputum to a greenish yellow. No fever. Some continued wheeze  ROV 11/28/14 -- follow-up visit for tobacco use, severe COPD, an enlarging PET positive right upper lobe cavitary nodule as well as some stable left upper lobe nodules. I asked him to be seen by cardiology for screening and in preparation for a possible bronchoscopy. He was seen on 11/19/14 and It was felt that  he was stable to proceed. We discussed in detail today. Have recommended and decided to proceed. He is on plavix, will need to stop for 5-7 days.   ROV 01/07/15 -- follow-up visit for tobacco use, severe COPD and abnormal CT scan of the chest. 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 He has been seen by Dr. Pablo Ledger in radiation oncology regarding SBRT. He is scheduled for PET on 10/24, simulation on 10/26. He is back on plavix.   ROV 02/20/15 -- follow-up visit for severe  COPD, newly diagnosed squamous cell lung cancer. He is undergoing SBRT under the direction of Dr Pablo Ledger.  He underwent PET scan on 01/13/15 that I personally reviewed. He is managed on Spiriva and Advair. Cost is an issue and he is wondering if he can stop the Advair. He is tolerating XRT. Having some cough, some pale green phlegm. Wheezes frequently. Uses SABA 1-2x a day.   ROV 04/17/15 -- follow-up visit for severe COPD and squamous cell lung cancer that has been treated with SBRT. He has been managed on Spiriva and Advair. Last time we discussed changing to once a day medication.  His cough is about the same. He indicates that Anoro and Stiolto are both tier 3 on his insurance. He has CT scan of his chest on 1/24 that showed improvement in his adenopathy and also in his known right upper lobe nodule that was treated with XRT. He also has 2 very small nodules surrounding the known lung cancer of unclear significance. I discussed these with him today and we will plan to follow these with Dr. Pablo Ledger.    Objective:  Physical Exam Filed Vitals:   04/17/15 1623 04/17/15 1624  BP:  132/98  Pulse:  79  Height: '5\' 9"'$  (1.753 m)   Weight: 186 lb (84.369 kg)   SpO2:  100%    Gen: Pleasant, overweight man, in no distress,  normal affect  ENT: No lesions,  mouth clear,  oropharynx clear, no postnasal drip  Neck: No JVD, no TMG, no carotid bruits  Lungs: No use of accessory muscles, coarse breath sounds bilaterally, no wheezing  Cardiovascular: RRR, heart sounds normal, no murmur or gallops, no peripheral edema  Musculoskeletal: No deformities, no cyanosis or clubbing  Neuro: alert, non focal  Skin: Warm, no lesions or rashes  04/15/15 COMPARISON: PET of 01/13/2015.  FINDINGS: Mediastinum/Nodes: No supraclavicular adenopathy. Advanced aortic and branch vessel atherosclerosis. Multivessel coronary artery atherosclerosis. Normal heart size. Advanced coronary artery atherosclerosis.  Prior left ventricular infarct, including hypoattenuation in the lateral wall on image 48/series 2.  Pulmonary artery enlargement, including 3.5 cm outflow tract. No central pulmonary embolism, on this non-dedicated study.  Right paratracheal node measures 5 mm today versus 9 mm on the prior. No hilar adenopathy. Small hiatal hernia.  Lungs/Pleura: No pleural fluid. Mild centrilobular and paraseptal emphysema with lower lobe predominant bronchial wall thickening. Biapical pleural parenchymal scarring.  Cavitary lateral right upper lobe nodule measures 1.5 x 1.3 cm on image 20/series 5. Compare 2.0 x 2.0 cm on the prior exam (when remeasured).  Calcified right upper lobe granuloma.  A 4 mm right upper lobe pulmonary nodule on image 25/series 5. This is not readily apparent on the prior PET or most recent chest CT.  3 mm left upper lobe pulmonary nodule on image 32/series 5 is likely similar on the prior exam.  The previously described right-sided airspace disease has  resolved.  A subpleural right lower lobe pulmonary nodule measures 4 mm on image 34/ series 5 and is new.  Upper abdomen: Too small to characterize lesion in the right lobe of the liver at 3 mm on image 70/series 2. Normal imaged portions of the spleen, pancreas, adrenal glands, right kidney. Incompletely imaged left renal lesion is likely a cyst. Advanced abdominal aortic atherosclerosis. Partially calcified node in the gastrohepatic ligament measures 1.5 cm today versus 1.6 cm on 10/03/2014. 1.4 cm on 01/13/2015.  Musculoskeletal: No acute osseous abnormality.  IMPRESSION: 1. The cavitary right upper lobe pulmonary nodule is decreased in size. tiny pulmonary nodules which are new compared to prior exams, and suspicious for pulmonary metastasis. Recommend attention on follow-up. 2. Decreased size of the previously described mediastinal node. No new thoracic adenopathy. 3. Similar isolated  gastrohepatic ligament calcified adenopathy. Not a typical appearance of metastasis from primary bronchogenic carcinoma. Favored to be reactive. This also warrants followup attention. 4. Atherosclerosis, including within the coronary arteries. 5. Pulmonary artery enlargement suggests pulmonary arterial hypertension. 6. Resolved right-sided airspace disease.   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 This showed that his mediastinal lymphadenopathy and also his right upper lobe nodule are shrinking following treatment. There are also 2 small satellite nodules noted in the right upper lobe that I did not see before. Significance of these is unclear but we will need to follow closely to ensure that this does not represent active malignancy. There were small enough that it is not clear to me there is a role for biopsy or empiric radiation or chemotherapy at this time. I suspect that he will need to have a follow-up film to look for interval change.   Chronic obstructive pulmonary disease He would like to attempt to change from Spiriva plus Advair to an alternative today. We will give samples of Stiolto and see if he tolerates and benefits. If so we will send a prescription for him. If he prefers his prior regimen and we will change back

## 2015-04-17 NOTE — Patient Instructions (Addendum)
Stop Advair and spiriva for now.  Start Stiolto 2 puffs once a day  Call our office to report on how you are doing on the new medication. If you do well with it we will continue.  You were CT scan of the chest from 1/24 shows improvement in your right upper lobe nodule. There are 2 very small new nodules present that will need to be followed by [place] and by Dr Pablo Ledger.  Follow with Dr Lamonte Sakai in 4 months or sooner if you have any problems.

## 2015-04-17 NOTE — Assessment & Plan Note (Signed)
He would like to attempt to change from Spiriva plus Advair to an alternative today. We will give samples of Stiolto and see if he tolerates and benefits. If so we will send a prescription for him. If he prefers his prior regimen and we will change back

## 2015-04-28 ENCOUNTER — Telehealth: Payer: Self-pay | Admitting: Emergency Medicine

## 2015-04-28 MED ORDER — TIOTROPIUM BROMIDE MONOHYDRATE 18 MCG IN CAPS: ORAL_CAPSULE | RESPIRATORY_TRACT | Status: DC

## 2015-04-28 MED ORDER — FLUTICASONE-SALMETEROL 500-50 MCG/DOSE IN AEPB: INHALATION_SPRAY | RESPIRATORY_TRACT | Status: DC

## 2015-04-28 NOTE — Telephone Encounter (Signed)
Pt is aware of RB's recommendation. Refills have been sent in for Advair and Spiriva. Nothing further was needed.

## 2015-04-28 NOTE — Telephone Encounter (Signed)
Pt states that at last OV with RB he was advised to call back about new medications started.  Pt started on Stiolto. States that his cough and wheezing increased a few days after starting the Stiolto. Pt used Stiolto x 8 days and increased symptoms began about 4 days into Stiolto trial. Pt started back on Advair and Spiriva. Pt is needing refills of Advair and Spiriva today if no other alternatives. Would like to know if he needs to stay on this or if another alternative is to be given. Thanks.

## 2015-04-28 NOTE — Telephone Encounter (Signed)
We can stop the stiolto.  One option would be to start Anoro once a day. This can often be associated w cough I believe he should restart the [place] for now since we know he has benefited from this.

## 2015-05-02 ENCOUNTER — Other Ambulatory Visit: Payer: Self-pay | Admitting: Family Medicine

## 2015-05-22 ENCOUNTER — Other Ambulatory Visit (INDEPENDENT_AMBULATORY_CARE_PROVIDER_SITE_OTHER): Payer: Medicare Other

## 2015-05-22 ENCOUNTER — Ambulatory Visit (INDEPENDENT_AMBULATORY_CARE_PROVIDER_SITE_OTHER)
Admission: RE | Admit: 2015-05-22 | Discharge: 2015-05-22 | Disposition: A | Discharge status: Home / Self Care | Payer: Medicare Other | Source: Ambulatory Visit | Attending: Pulmonary Disease | Admitting: Pulmonary Disease

## 2015-05-22 ENCOUNTER — Ambulatory Visit (INDEPENDENT_AMBULATORY_CARE_PROVIDER_SITE_OTHER): Payer: Medicare Other | Admitting: Pulmonary Disease

## 2015-05-22 ENCOUNTER — Encounter: Payer: Self-pay | Admitting: Pulmonary Disease

## 2015-05-22 VITALS — BP 106/60 | HR 90 | Temp 97.8°F | Ht 70.0 in | Wt 180.8 lb

## 2015-05-22 DIAGNOSIS — R05 Cough: Secondary | ICD-10-CM | POA: Diagnosis not present

## 2015-05-22 DIAGNOSIS — R059 Cough, unspecified: Secondary | ICD-10-CM | POA: Insufficient documentation

## 2015-05-22 DIAGNOSIS — J209 Acute bronchitis, unspecified: Secondary | ICD-10-CM | POA: Diagnosis not present

## 2015-05-22 DIAGNOSIS — J439 Emphysema, unspecified: Secondary | ICD-10-CM | POA: Insufficient documentation

## 2015-05-22 DIAGNOSIS — R0602 Shortness of breath: Secondary | ICD-10-CM | POA: Diagnosis not present

## 2015-05-22 DIAGNOSIS — J441 Chronic obstructive pulmonary disease with (acute) exacerbation: Secondary | ICD-10-CM | POA: Diagnosis not present

## 2015-05-22 DIAGNOSIS — Z720 Tobacco use: Secondary | ICD-10-CM | POA: Insufficient documentation

## 2015-05-22 LAB — CBC WITH DIFFERENTIAL/PLATELET
BASOS ABS: 0 10*3/uL (ref 0.0–0.1)
Basophils Relative: 0.3 % (ref 0.0–3.0)
EOS ABS: 0 10*3/uL (ref 0.0–0.7)
Eosinophils Relative: 0.1 % (ref 0.0–5.0)
HEMATOCRIT: 43.6 % (ref 39.0–52.0)
HEMOGLOBIN: 15 g/dL (ref 13.0–17.0)
LYMPHS PCT: 11.4 % — AB (ref 12.0–46.0)
Lymphs Abs: 1.1 10*3/uL (ref 0.7–4.0)
MCHC: 34.5 g/dL (ref 30.0–36.0)
MCV: 97 fl (ref 78.0–100.0)
Monocytes Absolute: 0.7 10*3/uL (ref 0.1–1.0)
Monocytes Relative: 7 % (ref 3.0–12.0)
Neutro Abs: 8 10*3/uL — ABNORMAL HIGH (ref 1.4–7.7)
Neutrophils Relative %: 81.2 % — ABNORMAL HIGH (ref 43.0–77.0)
Platelets: 226 10*3/uL (ref 150.0–400.0)
RBC: 4.5 Mil/uL (ref 4.22–5.81)
RDW: 13.2 % (ref 11.5–15.5)
WBC: 9.8 10*3/uL (ref 4.0–10.5)

## 2015-05-22 LAB — BASIC METABOLIC PANEL
BUN: 21 mg/dL (ref 6–23)
CALCIUM: 9.5 mg/dL (ref 8.4–10.5)
CO2: 29 mEq/L (ref 19–32)
CREATININE: 1.2 mg/dL (ref 0.40–1.50)
Chloride: 97 mEq/L (ref 96–112)
GFR: 62.75 mL/min (ref >60.00)
Glucose, Bld: 126 mg/dL — ABNORMAL HIGH (ref 70–99)
Potassium: 4.1 mEq/L (ref 3.5–5.1)
Sodium: 135 mEq/L (ref 135–145)

## 2015-05-22 MED ORDER — PREDNISONE 10 MG PO TABS: ORAL_TABLET | ORAL | Status: DC

## 2015-05-22 MED ORDER — METHYLPREDNISOLONE ACETATE 80 MG/ML IJ SUSP
80.0000 mg | Freq: Once | INTRAMUSCULAR | Status: AC
  Administered 2015-05-22: 80 mg via INTRAMUSCULAR

## 2015-05-22 MED ORDER — AZITHROMYCIN 250 MG PO TABS: ORAL_TABLET | ORAL | Status: DC

## 2015-05-22 NOTE — Assessment & Plan Note (Signed)
Has chronic cough but more cough x 1 week. Likely related to bronchitis. Rx bronchitis/PNA.

## 2015-05-22 NOTE — Patient Instructions (Signed)
1.  Start ZPAK,  2. Start prednisone taper, 40 mg/day  for 3 days then 30 mg/d for 3 days then 20 mg/d for 3 days then 10 mg/d for 3 days then stop.  3.  We will get a chest x-ray and  blood work today.we will call you with results. 4.  If you are not better within the next 24 hours,please give [place] a call. You may end  up needing IV steroids and may end up needing to be admitted. 5. Follow up with Dr. Lamonte Sakai as scheduled. Otherwise, sooner if you are not better in the next week.   Monica Becton, MD [facility] Pager [phone] After 3pm or if no response, call [phone] Office: [phone], Fax: [phone]

## 2015-05-22 NOTE — Progress Notes (Signed)
Subjective:    Patient ID: [name], male    DOB: 20-Apr-1940, 75 y.o.   MRN: [id number]  HPI  Patient is here as an urgent follow-up. He sees RB. Last seen on Apr 11, 2015. He has COPD and lung cancer. He is on Advair an Spiriva. He was doing relatively well until 5 days ago. Started with cough -- prod of green/white sputum. Cough has gotten. Has wheezing and more SOB. (-) fevers, chills.  (-) cp. Uses alb neb frequently. Still smokes cigarettes 1/4 PPD.   Review of Systems  Recent cough,congestion, dyspnea,wheezing.  More short of breath.  Denies fevers, chills.  Rest of review systems negative.    Objective:   Physical Exam  Vitals:  Filed Vitals:   05/22/15 1148  BP: 106/60  Pulse: 90  Temp: 97.8 F (36.6 C)  TempSrc: Oral  Height: '5\' 10"'$  (1.778 m)  Weight: 180 lb 12.8 oz (82.01 kg)  SpO2: 95%    Constitutional/General:  Pleasant, well-nourished, well-developed, in mild distress when he talks,  Comfortably seating.  Well kempt  Body mass index is 25.94 kg/(m^2). Wt Readings from Last 3 Encounters:  05/22/15 180 lb 12.8 oz (82.01 kg)  04/17/15 186 lb (84.369 kg)  03/26/15 189 lb 4.8 oz (85.866 kg)    HEENT: Pupils equal and reactive to light and accommodation. Anicteric sclerae. Normal nasal mucosa.   No oral  lesions,  mouth clear,  oropharynx clear, no postnasal drip. (-) Oral thrush. No dental caries.  Airway - Mallampati class III  Neck: No masses. Midline trachea. No JVD, (-) LAD. (-) bruits appreciated.  Respiratory/Chest: Grossly normal chest. (-) deformity. (-) Accessory muscle use.  Symmetric expansion. (-) Tenderness on palpation.  Resonant on percussion.  Diminished BS on both lower lung zones. (-)  crackles, rhonchi (+) wheezing Bilaterally -- more posteriorly and upper lung zones.  (-) egophony  Cardiovascular: Regular rate and  rhythm, heart sounds normal, no murmur or gallops, no peripheral edema  Gastrointestinal:  Normal  bowel sounds. Soft, non-tender. No hepatosplenomegaly.  (-) masses.   Musculoskeletal:  Normal muscle tone. Normal gait.   Extremities: Grossly normal. (-) clubbing, cyanosis.  (-) edema  Skin: (-) rash,lesions seen.   Neurological/Psychiatric : alert, oriented to time, place, person. Normal mood and affect            Assessment & Plan:  COPD exacerbation ([place]) Patient with COPD exacerbation since Saturday. Denies fevers or chills.has cough, wheezng, dyspnea.symptoms not better after almost  A week. No sick contacts. Plan: 1.  Depo-Medrol, 80 mg IM now. 2. Start  ZPAK. I wanted doxyycline but he was allergic to it. 3. Prednisone taper. Start 40 mg per day , decease by 10 mg every fourth day. 4. Cont advair and spiriva. 5. Cont duoneb -- may use every 4 hrs as needed.  6. Smoking cessation. 7. CXR, CBC, BMET.  8. Told pt to call if not better in next 12-24 hrs -- may need IV steroids/abx/admission.   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 Cont advair, spiriva. pred taper over > 1 week.  duoneb q 4 prn  Cough Has chronic cough but more cough x 1 week. Likely related to bronchitis. Rx bronchitis/PNA.   Tobacco user Still smokes 1/4 PPD. Encouraged pt to hold off on smoking while he is sick with aecopd.    Return to clinic in May with Dr. Lamonte Sakai. If he is not better by next week, told pt to call so he  can be seen again.   Monica Becton, MD [facility] Pager [phone] After 3pm or if no response, call [phone] Office: [phone], Fax: [phone]

## 2015-05-22 NOTE — Assessment & Plan Note (Signed)
Recent flare. Cont advair, spiriva. pred taper over > 1 week.  duoneb q 4 prn

## 2015-05-22 NOTE — Assessment & Plan Note (Signed)
Patient with COPD exacerbation since Saturday. Denies fevers or chills.has cough, wheezng, dyspnea.symptoms not better after almost  A week. No sick contacts. Plan: 1.  Depo-Medrol, 80 mg IM now. 2. Start  ZPAK. I wanted doxyycline but he was allergic to it. 3. Prednisone taper. Start 40 mg per day , decease by 10 mg every fourth day. 4. Cont advair and spiriva. 5. Cont duoneb -- may use every 4 hrs as needed.  6. Smoking cessation. 7. CXR, CBC, BMET.  8. Told pt to call if not better in next 12-24 hrs -- may need IV steroids/abx/admission.

## 2015-05-22 NOTE — Assessment & Plan Note (Signed)
Still smokes 1/4 PPD. Encouraged pt to hold off on smoking while he is sick with aecopd.

## 2015-06-02 ENCOUNTER — Other Ambulatory Visit: Payer: Self-pay | Admitting: Cardiovascular Disease

## 2015-06-20 ENCOUNTER — Other Ambulatory Visit: Payer: Self-pay | Admitting: Cardiovascular Disease

## 2015-07-01 ENCOUNTER — Other Ambulatory Visit: Payer: Self-pay | Admitting: Family Medicine

## 2015-07-01 NOTE — Telephone Encounter (Signed)
Rout refill request to [facility]

## 2015-07-07 ENCOUNTER — Ambulatory Visit (INDEPENDENT_AMBULATORY_CARE_PROVIDER_SITE_OTHER): Payer: Medicare Other | Admitting: Family Medicine

## 2015-07-07 ENCOUNTER — Encounter: Payer: Self-pay | Admitting: Family Medicine

## 2015-07-07 VITALS — BP 124/70 | HR 88 | Temp 98.3°F | Ht 70.0 in | Wt 176.0 lb

## 2015-07-07 DIAGNOSIS — R319 Hematuria, unspecified: Secondary | ICD-10-CM | POA: Diagnosis not present

## 2015-07-07 DIAGNOSIS — R6889 Other general symptoms and signs: Secondary | ICD-10-CM

## 2015-07-07 DIAGNOSIS — Z789 Other specified health status: Secondary | ICD-10-CM | POA: Diagnosis not present

## 2015-07-07 DIAGNOSIS — R634 Abnormal weight loss: Secondary | ICD-10-CM | POA: Diagnosis not present

## 2015-07-07 DIAGNOSIS — J441 Chronic obstructive pulmonary disease with (acute) exacerbation: Secondary | ICD-10-CM | POA: Diagnosis not present

## 2015-07-07 DIAGNOSIS — I951 Orthostatic hypotension: Secondary | ICD-10-CM

## 2015-07-07 LAB — POCT URINALYSIS DIPSTICK
BILIRUBIN UA: NEGATIVE
Blood, UA: NEGATIVE
GLUCOSE UA: NEGATIVE
KETONES UA: NEGATIVE
LEUKOCYTES UA: NEGATIVE
Nitrite, UA: NEGATIVE
PH UA: 6
Spec Grav, UA: >=1.03
UROBILINOGEN UA: 1

## 2015-07-07 MED ORDER — PREDNISONE 20 MG PO TABS: 40.0000 mg | ORAL_TABLET | Freq: Every day | ORAL | Status: DC

## 2015-07-07 NOTE — Progress Notes (Addendum)
Patient ID: [name], male   DOB: 28-Apr-1940, 75 y.o.   MRN: [id number]  Tommi Rumps, MD Phone: [phone]  [name] is a 75 y.o. male who presents today for same-day visit.  Patient reports 2 episodes of hematuria with clots and pink urine on Friday. Has not had any recurrence since then. He had some urgency when this occurred. No other urgency. No abdominal pain or burning with urination. Notes some nocturia. No fevers. Notes this was painless.  Patient additionally notes one month of fatigue and 2 months of weight loss that was unintentional. Notes he is down 28 pounds. On review of his weights it appears that he is down about 14 pounds since December. He notes he does not have an appetite. He stays tired all the time. He has no chest pain or shortness of breath. He does note cough productive of pale green sputum. He has been diagnosed with lung cancer and just recently finished radiation treatment. He has a history of prostate cancer greater than 10 years ago. He reports his most recent PSA was 0.06. He reports he was treated for a COPD exacerbation with a prednisone taper and azithromycin. Notes he improved though not completely. Still continues to have green sputum coughed up. No shortness of breath. He additionally notes he feels intermittently lightheaded on standing. No lightheadedness at other times. Only occurs with standing up from a seated or lying position. No chest pain, shortness breath, or palpitations with this. No syncope.  PMH: Smoker   ROS see history of present illness  Objective  Physical Exam Filed Vitals:   07/07/15 1515  BP: 124/70  Pulse: 88  Temp: 98.3 F (36.8 C)    BP Readings from Last 3 Encounters:  07/07/15 124/70  05/22/15 106/60  04/17/15 132/98   Wt Readings from Last 3 Encounters:  07/07/15 176 lb (79.833 kg)  05/22/15 180 lb 12.8 oz (82.01 kg)  04/17/15 186 lb (84.369 kg)   Laying blood pressure 144/78 pulse 84 Sitting  blood pressure 128/72 pulse 83  standing blood pressure 110/64 pulse 89  Physical Exam  Constitutional: No distress.  HENT:  Head: Normocephalic and atraumatic.  Right Ear: External ear normal.  Left Ear: External ear normal.  Mouth/Throat: Oropharynx is clear and moist. No oropharyngeal exudate.  Eyes: Conjunctivae are normal. Pupils are equal, round, and reactive to light.  Neck: Neck supple.  Cardiovascular: Normal rate, regular rhythm and normal heart sounds.   Pulmonary/Chest: Effort normal. No respiratory distress. He has wheezes (mild expiratory wheezes throughout). He has no rales.  Abdominal: Soft. Bowel sounds are normal. He exhibits no distension. There is no tenderness. There is no rebound and no guarding.  Genitourinary:  Prostate mildly enlarged, no nodules, nontender  Musculoskeletal: He exhibits no edema.  Lymphadenopathy:    He has no cervical adenopathy.  Neurological: He is alert.  CN 2-12 intact, 5/5 strength in bilateral biceps, triceps, grip, quads, hamstrings, plantar and dorsiflexion, sensation to light touch intact in bilateral UE and LE, normal gait, 2+ patellar reflexes  Skin: Skin is warm and dry. He is not diaphoretic.     Assessment/Plan: Please see individual problem list.  Hematuria Patient reports gross hematuria at home with clot formation. UA today with no blood. Benign prostate and abdominal exam. Given his smoking history his painless hematuria would be most concerning for malignant process. We will send his urine for microscopy and culture. We will refer him back to his urologist for further evaluation.  We'll additionally obtain a PSA today. He is given return precautions.  Unintentional weight loss Symptoms are quite concerning that he has developed unintentional weight loss of greater than 10 pounds over the last several months. He does have a history of recently treated non-small cell lung cancer and also has a history of prostate cancer that  could potentially be causative factors. Additionally he has had painless hematuria which could indicate another malignant process. I discussed these things with the patient at length. Discussed that we would need him to follow-up with his oncologist, PCP, and urologist for further workup of these issues. We will obtain lab work as outlined below to evaluate this process further. He will continue to monitor.  COPD exacerbation ([place]) Patient mild COPD exacerbation with continued cough productive of pale green sputum. No shortness of breath. Oxygen saturation is in the normal range. We will proceed with short course of prednisone to treat this again. We'll hold off on antibiotics at this time given that he is well-appearing. He'll continue his home COPD medications. He is given return precautions.  Orthostatic hypotension Suspect this is likely related to poor by mouth intake recently given decreased appetite. Could be related to his Flomax versus metoprolol as well. Given history of BPH and prostate cancer I am hesitant to stop his Flomax at this time. We will have him discuss this with his urologist. Given his history of MI and that he is already on a miniscule dose of metoprolol I'm hesitant to stop this as well. I discussed staying well-hydrated. To rise slowly as well. If worsens would consider change to medications. Given return precautions.    Orders Placed This Encounter  Procedures  . Urine Culture  . PSA  . Comp Met (CMET)  . TSH  . Urine Microscopic Only  . CBC  . Ambulatory referral to Urology    Referral Priority:  Routine    Referral Type:  Consultation    Referral Reason:  Specialty Services Required    Requested Specialty:  Urology    Number of Visits Requested:  1  . POCT Urinalysis Dipstick    Meds ordered this encounter  Medications  . predniSONE (DELTASONE) 20 MG tablet    Sig: Take 2 tablets (40 mg total) by mouth daily with breakfast.    Dispense:  10 tablet     Refill:  0    Tommi Rumps, MD [place]

## 2015-07-07 NOTE — Patient Instructions (Addendum)
Nice to meet you. We need to just schedule follow up with your urologist. I will have our schedulers schedule this for you. We are going to obtain some lab work to evaluate your tiredness and weight loss. He need to follow-up with his radiation oncologist and your primary care doctor. We will try to move up the oncology appointment. If you develop blood in your urine, abdominal pain, chest pain, shortness of breath, cough productive of blood, worsening fatigue, lightheadedness, or any new or changing symptoms please seek medical attention.

## 2015-07-07 NOTE — Progress Notes (Signed)
Pre visit review using our clinic review tool, if applicable. No additional management support is needed unless otherwise documented below in the visit note. 

## 2015-07-08 DIAGNOSIS — R634 Abnormal weight loss: Secondary | ICD-10-CM | POA: Insufficient documentation

## 2015-07-08 DIAGNOSIS — I959 Hypotension, unspecified: Secondary | ICD-10-CM | POA: Insufficient documentation

## 2015-07-08 DIAGNOSIS — R319 Hematuria, unspecified: Secondary | ICD-10-CM | POA: Insufficient documentation

## 2015-07-08 LAB — CBC
HCT: 36.2 % — ABNORMAL LOW (ref 39.0–52.0)
Hemoglobin: 12.2 g/dL — ABNORMAL LOW (ref 13.0–17.0)
MCHC: 33.8 g/dL (ref 30.0–36.0)
MCV: 98.3 fl (ref 78.0–100.0)
PLATELETS: 323 10*3/uL (ref 150.0–400.0)
RBC: 3.68 Mil/uL — ABNORMAL LOW (ref 4.22–5.81)
RDW: 14.8 % (ref 11.5–15.5)
WBC: 6.6 10*3/uL (ref 4.0–10.5)

## 2015-07-08 LAB — COMPREHENSIVE METABOLIC PANEL
ALK PHOS: 43 U/L (ref 39–117)
ALT: 8 U/L (ref 0–53)
AST: 12 U/L (ref 0–37)
Albumin: 4 g/dL (ref 3.5–5.2)
BILIRUBIN TOTAL: 0.4 mg/dL (ref 0.2–1.2)
BUN: 20 mg/dL (ref 6–23)
CO2: 31 mEq/L (ref 19–32)
CREATININE: 0.96 mg/dL (ref 0.40–1.50)
Calcium: 9.3 mg/dL (ref 8.4–10.5)
Chloride: 100 mEq/L (ref 96–112)
GFR: 81.15 mL/min (ref >60.00)
GLUCOSE: 106 mg/dL — AB (ref 70–99)
Potassium: 3.9 mEq/L (ref 3.5–5.1)
SODIUM: 137 meq/L (ref 135–145)
TOTAL PROTEIN: 6.5 g/dL (ref 6.0–8.3)

## 2015-07-08 LAB — TSH: TSH: 0.86 u[IU]/mL (ref 0.35–4.50)

## 2015-07-08 LAB — PSA: PSA: 1.61 ng/mL (ref 0.10–4.00)

## 2015-07-08 LAB — URINALYSIS, MICROSCOPIC ONLY: RBC / HPF: NONE SEEN (ref 0–?)

## 2015-07-08 NOTE — Assessment & Plan Note (Addendum)
Suspect this is likely related to poor by mouth intake recently given decreased appetite. Could be related to his Flomax versus metoprolol as well. Given history of BPH and prostate cancer I am hesitant to stop his Flomax at this time. We will have him discuss this with his urologist. Given his history of MI and that he is already on a miniscule dose of metoprolol I'm hesitant to stop this as well. I discussed staying well-hydrated. To rise slowly as well. If worsens would consider change to medications. Given return precautions.

## 2015-07-08 NOTE — Assessment & Plan Note (Signed)
Symptoms are quite concerning that he has developed unintentional weight loss of greater than 10 pounds over the last several months. He does have a history of recently treated non-small cell lung cancer and also has a history of prostate cancer that could potentially be causative factors. Additionally he has had painless hematuria which could indicate another malignant process. I discussed these things with the patient at length. Discussed that we would need him to follow-up with his oncologist, PCP, and urologist for further workup of these issues. We will obtain lab work as outlined below to evaluate this process further. He will continue to monitor.

## 2015-07-08 NOTE — Assessment & Plan Note (Signed)
Patient mild COPD exacerbation with continued cough productive of pale green sputum. No shortness of breath. Oxygen saturation is in the normal range. We will proceed with short course of prednisone to treat this again. We'll hold off on antibiotics at this time given that he is well-appearing. He'll continue his home COPD medications. He is given return precautions.

## 2015-07-08 NOTE — Assessment & Plan Note (Signed)
Patient reports gross hematuria at home with clot formation. UA today with no blood. Benign prostate and abdominal exam. Given his smoking history his painless hematuria would be most concerning for malignant process. We will send his urine for microscopy and culture. We will refer him back to his urologist for further evaluation. We'll additionally obtain a PSA today. He is given return precautions.

## 2015-07-09 ENCOUNTER — Telehealth: Payer: Self-pay | Admitting: *Deleted

## 2015-07-09 NOTE — Telephone Encounter (Signed)
CALLED PATIENT TO INFORM THAT CT AND FU HAVE BEEN MOVED UP PER DR. Pablo Ledger, LVM FOR A RETURN CALL

## 2015-07-12 ENCOUNTER — Telehealth: Payer: Self-pay | Admitting: Family Medicine

## 2015-07-12 LAB — URINE CULTURE: Colony Count: >=100000

## 2015-07-12 MED ORDER — CEPHALEXIN 500 MG PO CAPS: 500.0000 mg | ORAL_CAPSULE | Freq: Three times a day (TID) | ORAL | Status: DC

## 2015-07-12 NOTE — Telephone Encounter (Signed)
Called and spoke to patient regarding urine culture results. Advised that it appears that he has a UTI. I did confirm that his augmentin reaction was an intolerance and NOT anaphylaxis. I discussed possible treatment options with keflex vs cipro. I outlined the risks of cipro. I also discussed the potential for diarrhea with keflex given prior reactions to augmentin. We decided to treat with keflex. Will send this to his pharmacy. Advised to take a probiotic or yogurt with the antibiotic. Also advised patient of lab work results. He notes he has follow-up with urology next week for his PSA already. He will discuss further work up of his anemia with his PCP.

## 2015-07-15 ENCOUNTER — Encounter: Payer: Self-pay | Admitting: Family Medicine

## 2015-07-15 ENCOUNTER — Ambulatory Visit (INDEPENDENT_AMBULATORY_CARE_PROVIDER_SITE_OTHER): Payer: Medicare Other | Admitting: Family Medicine

## 2015-07-15 VITALS — BP 124/68 | HR 82 | Temp 98.6°F | Ht 70.0 in | Wt 175.2 lb

## 2015-07-15 DIAGNOSIS — R634 Abnormal weight loss: Secondary | ICD-10-CM

## 2015-07-15 DIAGNOSIS — D649 Anemia, unspecified: Secondary | ICD-10-CM | POA: Insufficient documentation

## 2015-07-15 DIAGNOSIS — C3411 Malignant neoplasm of upper lobe, right bronchus or lung: Secondary | ICD-10-CM

## 2015-07-15 DIAGNOSIS — N3001 Acute cystitis with hematuria: Secondary | ICD-10-CM

## 2015-07-15 DIAGNOSIS — I10 Essential (primary) hypertension: Secondary | ICD-10-CM | POA: Diagnosis not present

## 2015-07-15 DIAGNOSIS — Z8546 Personal history of malignant neoplasm of prostate: Secondary | ICD-10-CM

## 2015-07-15 DIAGNOSIS — J441 Chronic obstructive pulmonary disease with (acute) exacerbation: Secondary | ICD-10-CM | POA: Diagnosis not present

## 2015-07-15 DIAGNOSIS — N390 Urinary tract infection, site not specified: Secondary | ICD-10-CM | POA: Insufficient documentation

## 2015-07-15 DIAGNOSIS — Z720 Tobacco use: Secondary | ICD-10-CM

## 2015-07-15 DIAGNOSIS — F413 Other mixed anxiety disorders: Secondary | ICD-10-CM

## 2015-07-15 MED ORDER — IPRATROPIUM-ALBUTEROL 0.5-2.5 (3) MG/3ML IN SOLN: RESPIRATORY_TRACT | Status: DC

## 2015-07-15 NOTE — Progress Notes (Signed)
Pre visit review using our clinic review tool, if applicable. No additional management support is needed unless otherwise documented below in the visit note. 

## 2015-07-15 NOTE — Assessment & Plan Note (Signed)
This is new in pt with lung cancer and hx of prostate ca ? If iron def or chronic dz related  Lab today for ferritin and B12 Heme cards for stool times 3  He is hesitant to undergo colonoscopy (had polyps in 03) due to intolerance of prep  Pending labs - may have to re consider

## 2015-07-15 NOTE — Assessment & Plan Note (Signed)
Recent bump in psa 17 lb wt loss since Dec Lab Results  Component Value Date   PSA 1.61 07/07/2015   PSA 0.22 10/30/2014   PSA 5.23* 09/18/2008   For urol f/u upcoming

## 2015-07-15 NOTE — Progress Notes (Signed)
Subjective:    Patient ID: [name], male    DOB: 1940/06/28, 75 y.o.   MRN: [id number]  HPI Here for f/u of acute and chronic health problems  Seen by Dr Caryl Bis recently for several problems incl hematuria (uti), wt loss and exac of copd   UTI- treated with keflex (made him queasy the first day and then tolerated it well) No more blood in urine  Symptoms are improved  Sees urology May 2  Lab Results  Component Value Date   PSA 1.61 07/07/2015   PSA 0.22 10/30/2014   PSA 5.23* 09/18/2008   hx of cancer of prostate    Copd-treated with prednisone - improved with prednisone and keflex  Still smokes 1/2 ppd  Does not think anything will get him to quit at this point   Due for f/u CT lungs tomorrow  Had 3 radiation treatments for lung cancer  Then has f/u with Dr Pablo Ledger   Last check carotid arteries-was good/ improved per pt (as of December)  Lost 17 lb since December  Not much appetite- it is picking up a bit   Anemic Lab Results  Component Value Date   WBC 6.6 07/07/2015   HGB 12.2* 07/07/2015   HCT 36.2* 07/07/2015   MCV 98.3 07/07/2015   PLT 323.0 07/07/2015   Hb was 15   He had last colonoscopy in 2003  Showed polyps Says he cannot tolerate large amt of prep   Feels stressed regarding health problems  Still has xanax- does not take it often   Patient Active Problem List   Diagnosis Date Noted  . UTI (urinary tract infection) 07/15/2015  . Anemia 07/15/2015  . Hematuria 07/08/2015  . Unintentional weight loss 07/08/2015  . Orthostatic hypotension 07/08/2015  . COPD exacerbation ([place]) 05/22/2015  . COPD with emphysema ([place]) 05/22/2015  . Cough 05/22/2015  . Tobacco user 05/22/2015  . Mediastinal lymphadenopathy 01/22/2015  . 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 S/P bronchoscopy with biopsy   . Colon cancer screening 11/06/2014  . Encounter for Medicare annual wellness exam 11/06/2014  . Routine general medical examination  at a health care facility 11/06/2014  . Acute bronchitis 10/17/2014  . Lung nodule 06/28/2014  . Carotid stenosis 12/20/2013  . Aftercare following surgery of the circulatory system 12/20/2013  . Aftercare following surgery of the circulatory system, [place] 12/19/2012  . Personal history of colonic polyps 08/28/2012  . Hyperglycemia 08/07/2012  . Boils 04/03/2012  . Intertrigo 04/03/2012  . Pre-operative cardiovascular examination 11/03/2011  . Carotid artery disease ([place]) 05/20/2011  . Grief reaction 05/07/2011  . Ischemic cardiomyopathy 01/13/2011  . Palpitations 11/10/2010  . Occlusion and stenosis of carotid artery without mention of cerebral infarction 04/02/2010  . Anxiety disorder 12/12/2009  . HYPERTENSION, BENIGN 09/29/2008  . History of prostate cancer 09/19/2008  . Hyperlipidemia 02/10/2007  . AMAUROSIS FUGAX 02/10/2007  . MYOCARDIAL INFARCTION, HX OF 02/10/2007  . Coronary atherosclerosis 02/10/2007  . PERIPHERAL VASCULAR DISEASE 02/10/2007  . HEMORRHOIDS 02/10/2007  . Chronic obstructive pulmonary disease ([place]) 02/10/2007  . OSTEOARTHRITIS 02/10/2007  . Nicotine dependence 02/10/2007  . PURE HYPERCHOLESTEROLEMIA 01/27/2007  . CORONARY ATHEROSCLEROSIS NATIVE CORONARY ARTERY 01/27/2007   Past Medical History  Diagnosis Date  . COPD (chronic obstructive pulmonary disease) ([place])   . CAD (coronary artery disease)     AMI 1997 w/ BMS x 2 LAD, BMS CFX 2002, DES x 2 LAD 2009, DES CFX & OM 2012   .  HTN (hypertension)   . Hyperlipidemia   . Peripheral vascular disease ([place])   . Carotid artery disease ([place])     RICA CEA 9379, LICA CEA 0240 per Dr. [name],   . Myocardial infarction ([place])   . GERD (gastroesophageal reflux disease)     occ  . Anxiety   . PAC (premature atrial contraction)   . Pneumonia   . Hydradenitis   . Ischemic cardiomyopathy     EF 40-45 percent at cath 2012  . Stroke [facility]) 11-24-11    "no feeling in right 5th digit"  . CHF (congestive heart failure)  ([place])     pt denied in 2014  . Osteoarthritis     pt. denies 01/17/2015  . Shortness of breath dyspnea   . Urinary frequency   . Urinary urgency   . AAA (abdominal aortic aneurysm) ([facility])     4.2 cm IR AAA noted on 01/13/15 PET scan  . Prostate cancer ([place]) dx'd approx 2008    xrt throughfoley  . Non-small cell lung cancer (NSCLC) ([place]) dx'd 12/2014    SBRT   Past Surgical History  Procedure Laterality Date  . Coronary stent placement  1997, 2002, 2009, 2012    8 stents total  . Doppler echocardiography  1995    neg  . Colonoscopy  04-2001    polyp  . Cardiovascular stress test  12-2006  . Cardiac catheterization    . Vascular surgery    . Endarterectomy  11/24/2011    Procedure: ENDARTERECTOMY CAROTID;  Surgeon: Rosetta Posner, MD;  Location: [facility];  Service: Vascular;  Laterality: Left;  . Carotid endarterectomy Right 03-2000    CE  . Carotid endarterectomy Left 11-24-11    CE  . Prostate surgery      Radiation Tx  X's 40  . Video bronchoscopy with endobronchial navigation N/A 12/25/2014    Procedure: VIDEO BRONCHOSCOPY WITH ENDOBRONCHIAL NAVIGATION  with Biopsies and Brushings;  Surgeon: Collene Gobble, MD;  Location: [facility];  Service: Thoracic;  Laterality: N/A;  . Fuducial placement N/A 12/25/2014    Procedure: PLACEMENT OF FUDUCIAL;  Surgeon: Collene Gobble, MD;  Location: [facility];  Service: Thoracic;  Laterality: N/A;  . Eye surgery Bilateral Aug. 17, 2015    Cataract  . Video bronchoscopy with endobronchial ultrasound N/A 01/22/2015    Procedure: VIDEO BRONCHOSCOPY WITH ENDOBRONCHIAL ULTRASOUND with Biopsies;  Surgeon: Collene Gobble, MD;  Location: [facility];  Service: Thoracic;  Laterality: N/A;   Social History  Substance Use Topics  . Smoking status: Current Every Day Smoker -- 0.50 packs/day for 55 years    Types: Cigarettes  . Smokeless tobacco: Never Used     Comment: 1/2 ppd (01/07/15)  . Alcohol Use: 0.0 oz/week    0 Standard drinks or equivalent per week     Comment:  occ   Family History  Problem Relation Age of Onset  . Stroke Father   . Heart disease Father   . Heart disease Mother     pacemaker  . Other Brother     benign brain tumor  . Cancer Brother     Eye-behind  . Liver cancer Brother   . Diabetes Maternal Grandmother   . Cancer Sister     Breast   Allergies  Allergen Reactions  . Augmentin [Amoxicillin-Pot Clavulanate] Nausea And Vomiting    Diarrhea  . Sulfonamide Derivatives Other (See Comments)    Pt do not remember 8-30 md  .  Zantac [Ranitidine Hcl] Diarrhea   Current Outpatient Prescriptions on File Prior to Visit  Medication Sig Dispense Refill  . ALPRAZolam (XANAX) 0.5 MG tablet Take 1 tablet (0.5 mg total) by mouth at bedtime as needed. Sleep or anxiety 30 tablet 1  . aspirin EC 81 MG tablet Take 81 mg by mouth daily.    . cephALEXin (KEFLEX) 500 MG capsule Take 1 capsule (500 mg total) by mouth 3 (three) times daily. 21 capsule 0  . clopidogrel (PLAVIX) 75 MG tablet Take 1 tablet (75 mg total) by mouth daily. You may restart this medication on 12/27/14    . Fluticasone-Salmeterol (ADVAIR DISKUS) 500-50 MCG/DOSE AEPB Inhale 1 puff into the lungs 2 (two) times daily. 3 each 0  . loratadine (CLARITIN) 10 MG tablet TAKE 1 TABLET BY MOUTH EVERY DAY 30 tablet 5  . metoprolol tartrate (LOPRESSOR) 25 MG tablet TAKE 1/2 TABLET (12.5 MG TOTAL) BY MOUTH 2 (TWO) TIMES DAILY. 30 tablet 7  . Multiple Vitamins-Minerals (MULTIVITAMIN WITH MINERALS) tablet Take 1 tablet by mouth daily.    . nitroGLYCERIN (NITROSTAT) 0.4 MG SL tablet Place 1 tablet (0.4 mg total) under the tongue every 5 (five) minutes as needed. For chest pain 25 tablet 6  . pravastatin (PRAVACHOL) 40 MG tablet Take 1 tablet (40 mg total) by mouth daily. 30 tablet 11  . Tamsulosin HCl (FLOMAX) 0.4 MG CAPS Take 0.4 mg by mouth daily.     Marland Kitchen tiotropium (SPIRIVA HANDIHALER) 18 MCG inhalation capsule INHALE 1 CAPSULE VIA HANDIHALER ONCE DAILY AT THE SAME TIME EVERY DAY 30 capsule  5  . VENTOLIN HFA 108 (90 Base) MCG/ACT inhaler INHALE 2 PUFFS INTO THE LUNGS EVERY 4 (FOUR) HOURS AS NEEDED FOR WHEEZING OR SHORTNESS OF BREATH. 18 Inhaler 10   No current facility-administered medications on file prior to visit.    Review of Systems Review of Systems  Constitutional: Negative for fever, appetite change, and unexpected weight change.  ENT pos for pnd Eyes: Negative for pain and visual disturbance.  Respiratory: pos for baseline  for cough and shortness of breath. (improved wheezing)  Cardiovascular: Negative for cp or palpitations    Gastrointestinal: Negative for nausea, diarrhea and constipation.  Genitourinary: Negative for urgency and frequency.  Skin: Negative for pallor or rash   Neurological: Negative for weakness, light-headedness, numbness and headaches.  Hematological: Negative for adenopathy. Does not bruise/bleed easily.  Psychiatric/Behavioral: Negative for dysphoric mood. The patient is nervous/anxious.         Objective:   Physical Exam  Constitutional: He appears well-developed and well-nourished. No distress.  Well appearing- weight loss noted   HENT:  Head: Normocephalic and atraumatic.  Mouth/Throat: Oropharynx is clear and moist.  Eyes: Conjunctivae and EOM are normal. Pupils are equal, round, and reactive to light.  Neck: Normal range of motion. Neck supple. No JVD present. Carotid bruit is not present. No thyromegaly present.  Cardiovascular: Normal rate, regular rhythm, normal heart sounds and intact distal pulses.  Exam reveals no gallop.   Pulmonary/Chest: Effort normal. No respiratory distress. He has wheezes. He has no rales.  No crackles  Diffusely distant bs Diffuse exp wheeze Mildly prolonged exp phase  Abdominal: Soft. Bowel sounds are normal. He exhibits no distension, no abdominal bruit and no mass. There is no tenderness. There is no rebound and no guarding.  No suprapubic tenderness or fullness  No cva tenderness     Musculoskeletal: He exhibits no edema or tenderness.  Lymphadenopathy:    He  has no cervical adenopathy.  Neurological: He is alert. He has normal reflexes.  Skin: Skin is warm and dry. No rash noted.  Psychiatric: His speech is normal and behavior is normal. Thought content normal. His mood appears anxious. He does not exhibit a depressed mood.  Mildly anxious  Pleasant           Assessment & Plan:   Problem List Items Addressed This Visit      Cardiovascular and Mediastinum   HYPERTENSION, BENIGN - Primary    bp in fair control at this time  BP Readings from Last 1 Encounters:  07/15/15 124/68   No changes needed Disc lifstyle change with low sodium diet and exercise  Stable with wt loss          Respiratory   COPD exacerbation ([place])    Improved after low dose prednisone course and keflex  Stable exam  Does not think he will quit smoking unfortunately (also being tx for lung cancer) Refilled duoneb today pulm f/u as planned       Relevant Medications   ipratropium-albuterol (DUONEB) 0.5-2.5 (3) MG/3ML SOLN   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 If iron def or chronic dz related  Lab today for ferritin and B12 Heme cards for stool times 3  He is hesitant to undergo colonoscopy (had polyps in 03) due to intolerance of prep  Pending labs - may have to re consider        Relevant Orders   Ferritin   Vitamin B12   Anxiety disorder    Primarily anxious about health He takes xanax infrequently  This has affected appetite-discussed this  today ? May benefit from SSRI or other in future       History of prostate cancer    Recent bump in psa 17 lb wt loss since Dec Lab Results  Component Value Date   PSA 1.61 07/07/2015   PSA 0.22 10/30/2014   PSA 5.23* 09/18/2008   For urol f/u upcoming       Tobacco user    Disc in detail risks of smoking and possible outcomes including copd, vascular/ heart disease, cancer , respiratory and sinus infections  Pt voices understanding  Pt continues to smoke despite dx of lung cancer and known cardiac dz and copd He is candid in stating he is not motivated to quit at this time chantix was not helpful      Unintentional weight loss    Suspect multifactorial in elderly male with lung cancer and hx of prostate cancer along with cardiac dz/copd and anxiety  Also new anemia-will work this up  He admits to very poor appetite-disc imp of adequate calories and ways to inc more protein in diet Labs rev For rad/onc and urology f/u soon  Will watch closely

## 2015-07-15 NOTE — Patient Instructions (Signed)
Do stool cards and return them to look for blood in stool since you are anemic  Lab today for ferritin (iron) and also B12 to see if we can find a cause for anemia besides chronic disease  Think about quitting smoking  Follow up with urology as planned -in May - finish your keflex  I sent in your duoneb I think weight loss and appetite loss are multifactorial (from multiple health problems)  Follow up with Dr Pablo Ledger as planned and get your CT

## 2015-07-15 NOTE — Assessment & Plan Note (Signed)
bp in fair control at this time  BP Readings from Last 1 Encounters:  07/15/15 124/68   No changes needed Disc lifstyle change with low sodium diet and exercise  Stable with wt loss

## 2015-07-15 NOTE — Assessment & Plan Note (Signed)
E coli- clinically improved with keflex  F/u with urology for this and elevated psa soon  Will update if hematuria or other symptoms return

## 2015-07-15 NOTE — Assessment & Plan Note (Signed)
Improved after low dose prednisone course and keflex  Stable exam  Does not think he will quit smoking unfortunately (also being tx for lung cancer) Refilled duoneb today pulm f/u as planned

## 2015-07-15 NOTE — Assessment & Plan Note (Addendum)
Primarily anxious about health He takes xanax infrequently  This has affected appetite-discussed this today ? May benefit from SSRI or other in future

## 2015-07-15 NOTE — Assessment & Plan Note (Signed)
For f/u with rad/onc Dr Pablo Ledger after CT upcoming 17 lb wt loss since dec and anemia is concerning  Pt aware- and appetite is down

## 2015-07-15 NOTE — Assessment & Plan Note (Signed)
Disc in detail risks of smoking and possible outcomes including copd, vascular/ heart disease, cancer , respiratory and sinus infections  Pt voices understanding  Pt continues to smoke despite dx of lung cancer and known cardiac dz and copd He is candid in stating he is not motivated to quit at this time chantix was not helpful

## 2015-07-15 NOTE — Assessment & Plan Note (Signed)
Suspect multifactorial in elderly male with lung cancer and hx of prostate cancer along with cardiac dz/copd and anxiety  Also new anemia-will work this up  He admits to very poor appetite-disc imp of adequate calories and ways to inc more protein in diet Labs rev For rad/onc and urology f/u soon  Will watch closely

## 2015-07-16 ENCOUNTER — Ambulatory Visit (HOSPITAL_COMMUNITY)
Admission: RE | Admit: 2015-07-16 | Discharge: 2015-07-16 | Disposition: A | Discharge status: Home / Self Care | Payer: Medicare Other | Source: Ambulatory Visit | Attending: Radiation Oncology | Admitting: Radiation Oncology

## 2015-07-16 ENCOUNTER — Encounter (HOSPITAL_COMMUNITY): Payer: Self-pay

## 2015-07-16 DIAGNOSIS — R590 Localized enlarged lymph nodes: Secondary | ICD-10-CM | POA: Diagnosis not present

## 2015-07-16 DIAGNOSIS — C3411 Malignant neoplasm of upper lobe, right bronchus or lung: Secondary | ICD-10-CM | POA: Diagnosis not present

## 2015-07-16 LAB — FERRITIN: FERRITIN: 25.8 ng/mL (ref 22.0–322.0)

## 2015-07-16 MED ORDER — IOPAMIDOL (ISOVUE-300) INJECTION 61%
75.0000 mL | Freq: Once | INTRAVENOUS | Status: AC | PRN
  Administered 2015-07-16: 75 mL via INTRAVENOUS

## 2015-07-17 ENCOUNTER — Other Ambulatory Visit: Payer: Self-pay | Admitting: Family Medicine

## 2015-07-17 ENCOUNTER — Other Ambulatory Visit: Payer: Self-pay | Admitting: Emergency Medicine

## 2015-07-17 DIAGNOSIS — D649 Anemia, unspecified: Secondary | ICD-10-CM

## 2015-07-18 ENCOUNTER — Encounter: Payer: Self-pay | Admitting: Radiation Oncology

## 2015-07-18 ENCOUNTER — Telehealth: Payer: Self-pay | Admitting: *Deleted

## 2015-07-18 ENCOUNTER — Ambulatory Visit
Admission: RE | Admit: 2015-07-18 | Discharge: 2015-07-18 | Disposition: A | Discharge status: Home / Self Care | Payer: Medicare Other | Source: Ambulatory Visit | Attending: Radiation Oncology | Admitting: Radiation Oncology

## 2015-07-18 ENCOUNTER — Ambulatory Visit: Payer: Medicare Other | Admitting: Radiation Oncology

## 2015-07-18 ENCOUNTER — Ambulatory Visit: Admission: RE | Admit: 2015-07-18 | Payer: Medicare Other | Source: Ambulatory Visit | Admitting: Radiation Oncology

## 2015-07-18 VITALS — BP 153/72 | HR 93 | Temp 98.0°F | Resp 18 | Ht 70.0 in | Wt 177.8 lb

## 2015-07-18 DIAGNOSIS — C3411 Malignant neoplasm of upper lobe, right bronchus or lung: Secondary | ICD-10-CM

## 2015-07-18 NOTE — Progress Notes (Signed)
[name] is here today for a follow up visit for NSCLC of the upper right lobe to receive results from his CT chest scan.  Weight changes, if any: Wt Readings from Last 3 Encounters:  07/18/15 177 lb 12.8 oz (80.65 kg)  07/15/15 175 lb 4 oz (79.493 kg)  07/07/15 176 lb (79.833 kg)    Respiratory complaints, if any: SOB,coughing secretion pale green since antibiotic has cleared up. Hemoptysis, if any: No  Swallowing Problems/Pain/Difficulty swallowing:No Smoking Tobacco/Marijuana/Snuff/ETOH GIT:[id number] 1/2 pack/day Skin:Normal Pain : 1/10 left knee Tylenol Appetite:Has cut back on how much he eat because the food does not take good. Energy level:Having fatigue all the time. BP 153/72 mmHg  Pulse 93  Temp(Src) 98 F (36.7 C) (Oral)  Resp 18  Ht '5\' 10"'$  (1.778 m)  Wt 177 lb 12.8 oz (80.65 kg)  BMI 25.51 kg/m2  SpO2 95% When is next chemo scheduled?:None Lab work from of chart:07-15-15,07-07-15

## 2015-07-18 NOTE — Telephone Encounter (Signed)
Called patient to ask about coming in earlier today for fu, patient agreed to come in @ 4 pm

## 2015-07-22 ENCOUNTER — Encounter: Payer: Self-pay | Admitting: Primary Care

## 2015-07-22 ENCOUNTER — Other Ambulatory Visit (INDEPENDENT_AMBULATORY_CARE_PROVIDER_SITE_OTHER): Payer: Medicare Other

## 2015-07-22 ENCOUNTER — Ambulatory Visit (INDEPENDENT_AMBULATORY_CARE_PROVIDER_SITE_OTHER): Payer: Medicare Other | Admitting: Primary Care

## 2015-07-22 ENCOUNTER — Telehealth: Payer: Self-pay | Admitting: Family Medicine

## 2015-07-22 ENCOUNTER — Encounter: Payer: Self-pay | Admitting: Family Medicine

## 2015-07-22 VITALS — BP 134/90 | HR 98 | Temp 97.9°F | Ht 70.0 in | Wt 173.1 lb

## 2015-07-22 DIAGNOSIS — K625 Hemorrhage of anus and rectum: Secondary | ICD-10-CM | POA: Diagnosis not present

## 2015-07-22 DIAGNOSIS — Z0000 Encounter for general adult medical examination without abnormal findings: Secondary | ICD-10-CM | POA: Diagnosis not present

## 2015-07-22 DIAGNOSIS — E538 Deficiency of other specified B group vitamins: Secondary | ICD-10-CM | POA: Insufficient documentation

## 2015-07-22 DIAGNOSIS — D649 Anemia, unspecified: Secondary | ICD-10-CM | POA: Diagnosis not present

## 2015-07-22 DIAGNOSIS — Z8546 Personal history of malignant neoplasm of prostate: Secondary | ICD-10-CM | POA: Diagnosis not present

## 2015-07-22 LAB — COMPREHENSIVE METABOLIC PANEL
ALT: 10 U/L (ref 0–53)
AST: 14 U/L (ref 0–37)
Albumin: 4.1 g/dL (ref 3.5–5.2)
Alkaline Phosphatase: 44 U/L (ref 39–117)
BILIRUBIN TOTAL: 0.5 mg/dL (ref 0.2–1.2)
BUN: 11 mg/dL (ref 6–23)
CO2: 29 meq/L (ref 19–32)
Calcium: 9.9 mg/dL (ref 8.4–10.5)
Chloride: 102 mEq/L (ref 96–112)
Creatinine, Ser: 0.92 mg/dL (ref 0.40–1.50)
GFR: 85.22 mL/min (ref >60.00)
GLUCOSE: 118 mg/dL — AB (ref 70–99)
Potassium: 4.2 mEq/L (ref 3.5–5.1)
SODIUM: 138 meq/L (ref 135–145)
Total Protein: 6.8 g/dL (ref 6.0–8.3)

## 2015-07-22 LAB — CBC WITH DIFFERENTIAL/PLATELET
BASOS ABS: 0 10*3/uL (ref 0.0–0.1)
Basophils Relative: 0.4 % (ref 0.0–3.0)
Eosinophils Absolute: 0.1 10*3/uL (ref 0.0–0.7)
Eosinophils Relative: 1.8 % (ref 0.0–5.0)
HCT: 39.7 % (ref 39.0–52.0)
Hemoglobin: 13.4 g/dL (ref 13.0–17.0)
LYMPHS ABS: 1 10*3/uL (ref 0.7–4.0)
Lymphocytes Relative: 13.9 % (ref 12.0–46.0)
MCHC: 33.8 g/dL (ref 30.0–36.0)
MCV: 99.5 fl (ref 78.0–100.0)
MONOS PCT: 6.2 % (ref 3.0–12.0)
Monocytes Absolute: 0.4 10*3/uL (ref 0.1–1.0)
NEUTROS ABS: 5.4 10*3/uL (ref 1.4–7.7)
NEUTROS PCT: 77.7 % — AB (ref 43.0–77.0)
PLATELETS: 287 10*3/uL (ref 150.0–400.0)
RBC: 3.99 Mil/uL — ABNORMAL LOW (ref 4.22–5.81)
RDW: 15.2 % (ref 11.5–15.5)
WBC: 6.9 10*3/uL (ref 4.0–10.5)

## 2015-07-22 LAB — VITAMIN B12: Vitamin B-12: 198 pg/mL — ABNORMAL LOW (ref 211–911)

## 2015-07-22 LAB — IFOBT (OCCULT BLOOD): IFOBT: POSITIVE

## 2015-07-22 NOTE — Progress Notes (Signed)
Subjective:    Patient ID: [name], male    DOB: 30-Nov-1940, 75 y.o.   MRN: [id number]  HPI  Mr. Ferrara is a 75 year old male with a history of lung and prostate cancer and internal hemorrhoids managed on aspirin 81 mg and plavix 75 mg who presents today with a chief complaint of rectal bleeding.   He first noted bright red bleeding during an episode of diarrhea last night. The toilet bowl was full of blood. This morning his stool was more formed, but continued to noticed blood in the stool. The bleeding was much less than his prior episode the night before.  He denies acute weakness and fatigue, vomiting, fevers. He did experience moderate amount of abdominal pain to the periumbilical and RUQ region yesterday, denies pain today. His last colonoscopy on file was in 2003.   Review of Systems  Constitutional: Negative for fever and fatigue.  Respiratory: Negative for shortness of breath.   Gastrointestinal: Positive for blood in stool. Negative for nausea, vomiting and abdominal pain.  Neurological: Negative for dizziness and weakness.       Past Medical History  Diagnosis Date  . COPD (chronic obstructive pulmonary disease) ([place])   . CAD (coronary artery disease)     AMI 1997 w/ BMS x 2 LAD, BMS CFX 2002, DES x 2 LAD 2009, DES CFX & OM 2012   . HTN (hypertension)   . Hyperlipidemia   . Peripheral vascular disease ([place])   . Carotid artery disease ([place])     RICA CEA 1601, LICA CEA 0932 per Dr. [name],   . Myocardial infarction ([place])   . GERD (gastroesophageal reflux disease)     occ  . Anxiety   . PAC (premature atrial contraction)   . Pneumonia   . Hydradenitis   . Ischemic cardiomyopathy     EF 40-45 percent at cath 2012  . Stroke [facility]) 11-24-11    "no feeling in right 5th digit"  . CHF (congestive heart failure) ([place])     pt denied in 2014  . Osteoarthritis     pt. denies 01/17/2015  . Shortness of breath dyspnea   . Urinary frequency   . Urinary urgency   . AAA  (abdominal aortic aneurysm) ([facility])     4.2 cm IR AAA noted on 01/13/15 PET scan  . Prostate cancer ([place]) dx'd approx 2008    xrt throughfoley  . Non-small cell lung cancer (NSCLC) ([place]) dx'd 12/2014    SBRT     Social History   Social History  . Marital Status: Widowed    Spouse Name: N/A  . Number of Children: 5  . Years of Education: N/A   Occupational History  . Retired Airline pilot   . Part time, delivering auto parts    Social History Main Topics  . Smoking status: Current Every Day Smoker -- 0.50 packs/day for 55 years    Types: Cigarettes  . Smokeless tobacco: Never Used     Comment: 1/2 ppd (01/07/15)  . Alcohol Use: 0.0 oz/week    0 Standard drinks or equivalent per week     Comment: occ  . Drug Use: No  . Sexual Activity: Not on file   Other Topics Concern  . Not on file   Social History Narrative    Past Surgical History  Procedure Laterality Date  . Coronary stent placement  1997, 2002, 2009, 2012    8 stents total  . Doppler echocardiography  1995  neg  . Colonoscopy  04-2001    polyp  . Cardiovascular stress test  12-2006  . Cardiac catheterization    . Vascular surgery    . Endarterectomy  11/24/2011    Procedure: ENDARTERECTOMY CAROTID;  Surgeon: Rosetta Posner, MD;  Location: [facility];  Service: Vascular;  Laterality: Left;  . Carotid endarterectomy Right 03-2000    CE  . Carotid endarterectomy Left 11-24-11    CE  . Prostate surgery      Radiation Tx  X's 40  . Video bronchoscopy with endobronchial navigation N/A 12/25/2014    Procedure: VIDEO BRONCHOSCOPY WITH ENDOBRONCHIAL NAVIGATION  with Biopsies and Brushings;  Surgeon: Collene Gobble, MD;  Location: [facility];  Service: Thoracic;  Laterality: N/A;  . Fuducial placement N/A 12/25/2014    Procedure: PLACEMENT OF FUDUCIAL;  Surgeon: Collene Gobble, MD;  Location: [facility];  Service: Thoracic;  Laterality: N/A;  . Eye surgery Bilateral Aug. 17, 2015    Cataract  . Video bronchoscopy with endobronchial  ultrasound N/A 01/22/2015    Procedure: VIDEO BRONCHOSCOPY WITH ENDOBRONCHIAL ULTRASOUND with Biopsies;  Surgeon: Collene Gobble, MD;  Location[facility];  Service: Thoracic;  Laterality: N/A;    Family History  Problem Relation Age of Onset  . Stroke Father   . Heart disease Father   . Heart disease Mother     pacemaker  . Other Brother     benign brain tumor  . Cancer Brother     Eye-behind  . Liver cancer Brother   . Diabetes Maternal Grandmother   . Cancer Sister     Breast    Allergies  Allergen Reactions  . Augmentin [Amoxicillin-Pot Clavulanate] Nausea And Vomiting    Diarrhea  . Sulfonamide Derivatives Other (See Comments)    Pt do not remember 8-30 md  . Zantac [Ranitidine Hcl] Diarrhea    Current Outpatient Prescriptions on File Prior to Visit  Medication Sig Dispense Refill  . ALPRAZolam (XANAX) 0.5 MG tablet Take 1 tablet (0.5 mg total) by mouth at bedtime as needed. Sleep or anxiety 30 tablet 1  . aspirin EC 81 MG tablet Take 81 mg by mouth daily.    . clopidogrel (PLAVIX) 75 MG tablet Take 1 tablet (75 mg total) by mouth daily. You may restart this medication on 12/27/14    . Fluticasone-Salmeterol (ADVAIR DISKUS) 500-50 MCG/DOSE AEPB Inhale 1 puff into the lungs 2 (two) times daily. 3 each 0  . ipratropium-albuterol (DUONEB) 0.5-2.5 (3) MG/3ML SOLN INHALE CONTENTS OF 1 VIAL EVERY 4 HOURS AS NEEDED 180 mL 3  . loratadine (CLARITIN) 10 MG tablet TAKE 1 TABLET BY MOUTH EVERY DAY 30 tablet 5  . metoprolol tartrate (LOPRESSOR) 25 MG tablet TAKE 1/2 TABLET (12.5 MG TOTAL) BY MOUTH 2 (TWO) TIMES DAILY. 30 tablet 7  . Multiple Vitamins-Minerals (MULTIVITAMIN WITH MINERALS) tablet Take 1 tablet by mouth daily.    . nitroGLYCERIN (NITROSTAT) 0.4 MG SL tablet Place 1 tablet (0.4 mg total) under the tongue every 5 (five) minutes as needed. For chest pain 25 tablet 6  . pravastatin (PRAVACHOL) 40 MG tablet Take 1 tablet (40 mg total) by mouth daily. 30 tablet 11  . Tamsulosin  HCl (FLOMAX) 0.4 MG CAPS Take 0.4 mg by mouth daily.     Marland Kitchen tiotropium (SPIRIVA HANDIHALER) 18 MCG inhalation capsule INHALE 1 CAPSULE VIA HANDIHALER ONCE DAILY AT THE SAME TIME EVERY DAY 30 capsule 5  . VENTOLIN HFA 108 (90 Base) MCG/ACT inhaler  INHALE 2 PUFFS INTO THE LUNGS EVERY 4 (FOUR) HOURS AS NEEDED FOR WHEEZING OR SHORTNESS OF BREATH. 18 Inhaler 10   No current facility-administered medications on file prior to visit.    BP 134/90 mmHg  Pulse 98  Temp(Src) 97.9 F (36.6 C) (Oral)  Ht '5\' 10"'$  (1.778 m)  Wt 173 lb 1.9 oz (78.527 kg)  BMI 24.84 kg/m2  SpO2 95%    Objective:   Physical Exam  Constitutional: He appears well-nourished.  Cardiovascular: Normal rate and regular rhythm.   Pulmonary/Chest: Effort normal.  Abdominal: Soft. Bowel sounds are normal. There is no tenderness.  Hemoccult stool card positive  Skin: Skin is warm and dry.          Assessment & Plan:  Rectal Bleeding:  Present since yesterday, improved today. Denies abdominal pain, vomiting, fevers, weakness. Exam today with positive hemoccult stool card, otherwise unremarkable. Does not appear in distress or acutely ill. Suspect bleeding likely from internal hemorrhoids, however, given last colonoscopy was in 2003, will send to GI for further evaluation. Especially given history of lung and prostate cancers. CBC and CMP pending today. Strict return precautions provided.

## 2015-07-22 NOTE — Progress Notes (Signed)
Pre visit review using our clinic review tool, if applicable. No additional management support is needed unless otherwise documented below in the visit note. 

## 2015-07-22 NOTE — Patient Instructions (Signed)
Complete lab work prior to leaving today. I will notify you of your results once received.   Stop by the front desk and speak with either Rosaria Ferries or Ebony Hail regarding your referral to GI.  Please call me if you develop nausea, vomiting, fevers, abdominal pain, or increased bleeding in your stools.  It was a pleasure meeting you!

## 2015-07-22 NOTE — Telephone Encounter (Signed)
Pt has appt with Allie Bossier NP 07/22/15 at 9:45.

## 2015-07-22 NOTE — Addendum Note (Signed)
Addended by: Jacqualin Combes on: 07/22/2015 03:34 PM   Modules accepted: Orders

## 2015-07-22 NOTE — Addendum Note (Signed)
Addended by: Tammi Sou on: 07/22/2015 10:20 AM   Modules accepted: Orders

## 2015-07-22 NOTE — Telephone Encounter (Signed)
Patient Name: [name] DOB: 09-19-1940 Initial Comment Caller States he is having blood in his stool Nurse Assessment Nurse: Ronnald Ramp, RN, Miranda Date/Time (Eastern Time): 07/22/2015 8:55:52 AM Confirm and document reason for call. If symptomatic, describe symptoms. You must click the next button to save text entered. ---Caller states he had diarrhea last night and the toilet was full of blood. This morning he had a thicker stool but had blood in it as well. Has the patient traveled out of the country within the last 30 days? ---No Does the patient have any new or worsening symptoms? ---Yes Will a triage be completed? ---Yes Related visit to physician within the last 2 weeks? ---Yes Does the PT have any chronic conditions? (i.e. diabetes, asthma, etc.) ---Yes List chronic conditions. ---Lung cancer - active radiation treatment, hx of prostate cancer, COPD Is this a behavioral health or substance abuse call? ---No Guidelines Guideline Title Affirmed Question Affirmed Notes Rectal Bleeding MODERATE rectal bleeding (small blood clots, passing blood without stool, or toilet water turns red) Final Disposition User See Physician within 24 Hours Jones, RN, Miranda Comments No appt available today with PCP. Appt scheduled for 9:45a with Alma Friendly, NP Referrals REFERRED TO PCP OFFICE Disagree/Comply: Comply

## 2015-07-23 ENCOUNTER — Ambulatory Visit (INDEPENDENT_AMBULATORY_CARE_PROVIDER_SITE_OTHER): Payer: Medicare Other | Admitting: Gastroenterology

## 2015-07-23 ENCOUNTER — Encounter: Payer: Self-pay | Admitting: Gastroenterology

## 2015-07-23 ENCOUNTER — Telehealth: Payer: Self-pay | Admitting: *Deleted

## 2015-07-23 VITALS — BP 138/74 | HR 68 | Ht 70.0 in | Wt 174.6 lb

## 2015-07-23 DIAGNOSIS — K921 Melena: Secondary | ICD-10-CM | POA: Diagnosis not present

## 2015-07-23 DIAGNOSIS — I2510 Atherosclerotic heart disease of native coronary artery without angina pectoris: Secondary | ICD-10-CM

## 2015-07-23 MED ORDER — METOCLOPRAMIDE HCL 10 MG PO TABS: ORAL_TABLET | ORAL | Status: DC

## 2015-07-23 MED ORDER — NA SULFATE-K SULFATE-MG SULF 17.5-3.13-1.6 GM/177ML PO SOLN: 1.0000 | Freq: Once | ORAL | Status: DC

## 2015-07-23 NOTE — Progress Notes (Signed)
   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  He is being treated for a UTI. He is fatigued.  His primary care physician contacted me regarding weight loss and concerns that he might have cancer recurrence.  He had a CT on 4/26 showed stability of his treated disease with slight interval growth of a RLL nodule. No mediastinal or metastatic disease was noted.   Physical Exam:  Filed Vitals:   07/18/15 1533  BP: 153/72  Pulse: 93  Temp: 98 F (36.7 C)  TempSrc: Oral  Resp: 18  Height: '5\' 10"'$  (1.778 m)  Weight: 177 lb 12.8 oz (80.65 kg)  SpO2: 95%   Pleasant male in no distress.   IMPRESSION: [name] is a 75 y.o. male s/p SBRT with excellent response of treated lesion and no evidence of metastatic disease to explain his weight loss. SLIGHTLY enlarged right lower lobe nodule requires monitoring  PLAN:  Follow up in 3 months with repeat CT. Follow up with PCP regarding weight loss. This slightly enlarged nodule is not the cuase.     Thea Silversmith, MD

## 2015-07-23 NOTE — Patient Instructions (Signed)
You have been scheduled for a colonoscopy. Please follow written instructions given to you at your visit today.  Please pick up your prep supplies at the pharmacy within the next 1-3 days. If you use inhalers (even only as needed), please bring them with you on the day of your procedure. Your physician has requested that you go to www.startemmi.com and enter the access code given to you at your visit today. This web site gives a general overview about your procedure. However, you should still follow specific instructions given to you by our office regarding your preparation for the procedure.  You can use preperation H over the counter apply to rectum once daily for hemorrhoid symptoms.   Thank you for choosing me and [facility].  Pricilla Riffle. Dagoberto Ligas., MD., Marval Regal

## 2015-07-23 NOTE — Telephone Encounter (Signed)
CALLED PATIENT TO INFORM OF LAB, CT AND FU, SPOKE WITH PATIENT AND HE IS AWARE OF THESE APPTS.

## 2015-07-23 NOTE — Progress Notes (Signed)
    History of Present Illness: This is a 75 year old male referred by Pleas Koch, NP for the evaluation of hematochezia. He was recommended to undergo colonoscopy in 2014 for CRC screening however he did not proceed as he was concerned about the difficulty he might encounter taking the bowel prep. Colonoscopy in 2003 showed internal hemorrhoids which were injected and hyperplastic colon polyps. He notes small amounts of bright red blood per rectum with bowel movements on Monday and Tuesday of this week. He states he had similar symptoms about 6 years ago. Bowel movement today appeared normal. He has no other gastrointestinal complaints. He is maintained on Plavix for history of coronary artery disease. Denies weight loss, abdominal pain, constipation, diarrhea, change in stool caliber, melena, nausea, vomiting, dysphagia, reflux symptoms, chest pain.  Review of Systems: Pertinent positive and negative review of systems were noted in the above HPI section. All other review of systems were otherwise negative.  Current Medications, Allergies, Past Medical History, Past Surgical History, Family History and Social History were reviewed in [employer] record.  Physical Exam: General: Well developed, well nourished, no acute distress Head: Normocephalic and atraumatic Eyes:  sclerae anicteric, EOMI Ears: Normal auditory acuity Mouth: No deformity or lesions Neck: Supple, no masses or thyromegaly Lungs: Clear throughout to auscultation Heart: Regular rate and rhythm; no murmurs, rubs or bruits Abdomen: Soft, non tender and non distended. No masses, hepatosplenomegaly or hernias noted. Normal Bowel sounds Rectal: Deferred to colonoscopy Musculoskeletal: Symmetrical with no gross deformities  Skin: No lesions on visible extremities Pulses:  Normal pulses noted Extremities: No clubbing, cyanosis, edema or deformities noted Neurological: Alert oriented x 4, grossly  nonfocal Cervical Nodes:  No significant cervical adenopathy Inguinal Nodes: No significant inguinal adenopathy Psychological:  Alert and cooperative. Normal mood and affect  Assessment and Recommendations:  1. Hematochezia. Suspected benign anorectal source such as known internal hemorrhoids. R/O CRC neoplasms. Preparation H twice daily as needed. Schedule colonoscopy. The risks (including bleeding, perforation, infection, missed lesions, medication reactions and possible hospitalization or surgery if complications occur), benefits, and alternatives to colonoscopy with possible biopsy and possible polypectomy were discussed with the patient and they consent to proceed. Discussed SUPREP with Reglan 10 mg 30-60 minutes before each dose of prep.  2. Hold Plavix 5 days before procedure - will instruct when and how to resume after procedure. Low but real risk of cardiovascular event such as heart attack, stroke, embolism, thrombosis or ischemia/infarct of other organs off Plavix explained and need to seek urgent help if this occurs. The patient consents to proceed. Will communicate by phone or EMR with patient's prescribing provider to confirm that holding Plavix is reasonable in this case.    cc: Pleas Koch, NP [address] [place], [place] [address]

## 2015-07-24 ENCOUNTER — Ambulatory Visit: Payer: Medicare Other | Admitting: Radiation Oncology

## 2015-07-28 ENCOUNTER — Encounter: Payer: Self-pay | Admitting: Gastroenterology

## 2015-07-29 ENCOUNTER — Ambulatory Visit (INDEPENDENT_AMBULATORY_CARE_PROVIDER_SITE_OTHER): Payer: Medicare Other | Admitting: *Deleted

## 2015-07-29 ENCOUNTER — Telehealth: Payer: Self-pay

## 2015-07-29 ENCOUNTER — Telehealth: Payer: Self-pay | Admitting: Gastroenterology

## 2015-07-29 DIAGNOSIS — E538 Deficiency of other specified B group vitamins: Secondary | ICD-10-CM

## 2015-07-29 MED ORDER — CYANOCOBALAMIN 1000 MCG/ML IJ SOLN
1000.0000 ug | Freq: Once | INTRAMUSCULAR | Status: AC
  Administered 2015-07-29: 1000 ug via INTRAMUSCULAR

## 2015-07-29 NOTE — Telephone Encounter (Signed)
Informed patient on voicemail we are waiting to hear back from Dr. Camillia Herter office and will contact him as soon as they respond. See other phone note for response.

## 2015-07-29 NOTE — Telephone Encounter (Signed)
[name],  Have we heard about Plavix hold?

## 2015-07-29 NOTE — Telephone Encounter (Signed)
cleared

## 2015-07-29 NOTE — Telephone Encounter (Signed)
RE: BLAKELY GLUTH DOB: Oct 26, 1940 MRN: [id number]   Dear Dr. [name],    We have scheduled the above patient for an endoscopic procedure. Our records show that he is on anticoagulation therapy.   Please advise as to how long the patient may come off his therapy of Plavix prior to the procedure, which is scheduled for 08/11/15.  Please route your answer to Marlon Pel, Leslie.  Sincerely,    Marlon Pel, CMA

## 2015-07-29 NOTE — Telephone Encounter (Signed)
Patient notified to hold Plavix 5 days prior to his procedure. Patient verbalized understanding.

## 2015-07-29 NOTE — Telephone Encounter (Signed)
OK to hold Plavix for 5 days before his planned procedure.   MCALHANY,CHRISTOPHER 07/29/2015 2:24 PM

## 2015-08-07 ENCOUNTER — Ambulatory Visit (INDEPENDENT_AMBULATORY_CARE_PROVIDER_SITE_OTHER): Payer: Medicare Other | Admitting: *Deleted

## 2015-08-07 DIAGNOSIS — E538 Deficiency of other specified B group vitamins: Secondary | ICD-10-CM | POA: Diagnosis not present

## 2015-08-07 MED ORDER — CYANOCOBALAMIN 1000 MCG/ML IJ SOLN
1000.0000 ug | Freq: Once | INTRAMUSCULAR | Status: AC
  Administered 2015-08-07: 1000 ug via INTRAMUSCULAR

## 2015-08-11 ENCOUNTER — Ambulatory Visit (AMBULATORY_SURGERY_CENTER): Payer: Medicare Other | Admitting: Gastroenterology

## 2015-08-11 ENCOUNTER — Encounter: Payer: Self-pay | Admitting: Gastroenterology

## 2015-08-11 VITALS — BP 148/82 | HR 77 | Temp 98.6°F | Resp 16 | Ht 70.0 in | Wt 174.0 lb

## 2015-08-11 DIAGNOSIS — D124 Benign neoplasm of descending colon: Secondary | ICD-10-CM | POA: Diagnosis not present

## 2015-08-11 DIAGNOSIS — D123 Benign neoplasm of transverse colon: Secondary | ICD-10-CM

## 2015-08-11 DIAGNOSIS — D122 Benign neoplasm of ascending colon: Secondary | ICD-10-CM

## 2015-08-11 DIAGNOSIS — I739 Peripheral vascular disease, unspecified: Secondary | ICD-10-CM | POA: Diagnosis not present

## 2015-08-11 DIAGNOSIS — D125 Benign neoplasm of sigmoid colon: Secondary | ICD-10-CM

## 2015-08-11 DIAGNOSIS — K921 Melena: Secondary | ICD-10-CM | POA: Diagnosis present

## 2015-08-11 DIAGNOSIS — D129 Benign neoplasm of anus and anal canal: Secondary | ICD-10-CM

## 2015-08-11 DIAGNOSIS — D128 Benign neoplasm of rectum: Secondary | ICD-10-CM | POA: Diagnosis not present

## 2015-08-11 MED ORDER — SODIUM CHLORIDE 0.9 % IV SOLN: 500.0000 mL | INTRAVENOUS | Status: DC

## 2015-08-11 NOTE — Patient Instructions (Signed)
RESUME YOUR PLAVIX IN 2 DAYS, Aug 14, 2015.  NO ASPIRIN, ASPIRIN PRODUCTS OR NSAIDS (MOTRIN, ADVIL, IBUPROFEN, NAPROSYN ETC) FOR TWO WEEKS, August 25, 2019.   YOU HAD AN ENDOSCOPIC PROCEDURE TODAY AT [facility]:   Refer to the procedure report that was given to you for any specific questions about what was found during the examination.  If the procedure report does not answer your questions, please call your gastroenterologist to clarify.  If you requested that your care partner not be given the details of your procedure findings, then the procedure report has been included in a sealed envelope for you to review at your convenience later.  YOU SHOULD EXPECT: Some feelings of bloating in the abdomen. Passage of more gas than usual.  Walking can help get rid of the air that was put into your GI tract during the procedure and reduce the bloating. If you had a lower endoscopy (such as a colonoscopy or flexible sigmoidoscopy) you may notice spotting of blood in your stool or on the toilet paper. If you underwent a bowel prep for your procedure, you may not have a normal bowel movement for a few days.  Please Note:  You might notice some irritation and congestion in your nose or some drainage.  This is from the oxygen used during your procedure.  There is no need for concern and it should clear up in a day or so.  SYMPTOMS TO REPORT IMMEDIATELY:   Following lower endoscopy (colonoscopy or flexible sigmoidoscopy):  Excessive amounts of blood in the stool  Significant tenderness or worsening of abdominal pains  Swelling of the abdomen that is new, acute  Fever of 100F or higher   For urgent or emergent issues, a gastroenterologist can be reached at any hour by calling [phone].   DIET: Your first meal following the procedure should be a small meal and then it is ok to progress to your normal diet. Heavy or fried foods are harder to digest and may make you feel nauseous or  bloated.  Likewise, meals heavy in dairy and vegetables can increase bloating.  Drink plenty of fluids but you should avoid alcoholic beverages for 24 hours.  ACTIVITY:  You should plan to take it easy for the rest of today and you should NOT DRIVE or use heavy machinery until tomorrow (because of the sedation medicines used during the test).    FOLLOW UP: Our staff will call the number listed on your records the next business day following your procedure to check on you and address any questions or concerns that you may have regarding the information given to you following your procedure. If we do not reach you, we will leave a message.  However, if you are feeling well and you are not experiencing any problems, there is no need to return our call.  We will assume that you have returned to your regular daily activities without incident.  If any biopsies were taken you will be contacted by phone or by letter within the next 1-3 weeks.  Please call us at [phone] if you have not heard about the biopsies in 3 weeks.    SIGNATURES/CONFIDENTIALITY: You and/or your care partner have signed paperwork which will be entered into your electronic medical record.  These signatures attest to the fact that that the information above on your After Visit Summary has been reviewed and is understood.  Full responsibility of the confidentiality of this discharge information lies with  you and/or your care-partner.

## 2015-08-11 NOTE — Progress Notes (Signed)
Called to room to assist during endoscopic procedure.  Patient ID and intended procedure confirmed with present staff. Received instructions for my participation in the procedure from the performing physician.  

## 2015-08-11 NOTE — Op Note (Signed)
[facility] Patient Name: Eden Rho Procedure Date: 08/11/2015 2:45 PM MRN: [id number] Endoscopist: Ladene Artist , MD Age: 75 Referring MD:  Date of Birth: [birth year] Gender: Male Procedure:                Colonoscopy Indications:              Evaluation of unexplained GI bleeding, Hematochezia Medicines:                Monitored Anesthesia Care Procedure:                Pre-Anesthesia Assessment:                           - Prior to the procedure, a History and Physical                            was performed, and patient medications and                            allergies were reviewed. The patient's tolerance of                            previous anesthesia was also reviewed. The risks                            and benefits of the procedure and the sedation                            options and risks were discussed with the patient.                            All questions were answered, and informed consent                            was obtained. Prior Anticoagulants: The patient has                            taken Plavix (clopidogrel), last dose was 6 days                            prior to procedure. ASA Grade Assessment: III - A                            patient with severe systemic disease. After                            reviewing the risks and benefits, the patient was                            deemed in satisfactory condition to undergo the                            procedure.  After obtaining informed consent, the colonoscope                            was passed under direct vision. Throughout the                            procedure, the patient's blood pressure, pulse, and                            oxygen saturations were monitored continuously. The                            Model PCF-H190L [id number]) scope was introduced                            through the anus and advanced to the the cecum,             identified by appendiceal orifice and ileocecal                            valve. The colonoscopy was somewhat difficult due                            to a tortuous colon. Successful completion of the                            procedure was aided by using manual pressure. The                            patient tolerated the procedure well. The quality                            of the bowel preparation was adequate. The                            ileocecal valve, appendiceal orifice, and rectum                            were photographed. Scope In: 2:53:15 PM Scope Out: 3:22:25 PM Scope Withdrawal Time: 0 hours 24 minutes 49 seconds  Total Procedure Duration: 0 hours 29 minutes 10 seconds  Findings:                 The digital rectal exam was normal.                           A 35 mm polyp was found in the distal sigmoid                            colon. The polyp was pedunculated. The polyp was                            removed with a hot snare. Resection and retrieval  were complete. Area was tattooed with an injection                            of 3 mL of Spot (carbon black).                           A 12 mm polyp was found in the ascending colon. The                            polyp was sessile. The polyp was removed with a hot                            snare. Resection and retrieval were complete.                           A 12 mm polyp was found in the rectum. The polyp                            was sessile. The polyp was removed with a hot                            snare. Resection and retrieval were complete.                           Two sessile polyps were found in the descending                            colon and transverse colon. The polyps were 6 to 7                            mm in size. These polyps were removed with a cold                            snare. Resection and retrieval were complete. Mild                             diverticulosis in the descending colon.                           A 5 mm polyp was found in the transverse colon. The                            polyp was sessile. The polyp was removed with a                            cold biopsy forceps. Resection and retrieval were                            complete.                           Internal hemorrhoids were found during  retroflexion. The hemorrhoids were medium-sized and                            Grade I (internal hemorrhoids that do not prolapse).                           The exam was otherwise normal throughout the                            examined colon. Complications:            No immediate complications. Estimated Blood Loss:     Estimated blood loss: none. Impression:               - One 35 mm polyp in the distal sigmoid colon,                            removed with a hot snare. Resected and retrieved.                            Tattooed.                           - One 12 mm polyp in the ascending colon, removed                            with a hot snare. Resected and retrieved.                           - One 12 mm polyp in the rectum, removed with a hot                            snare. Resected and retrieved.                           - Two 6 to 7 mm polyps in the descending colon and                            in the transverse colon, removed with a cold snare.                            Resected and retrieved.                           - One 5 mm polyp in the transverse colon, removed                            with a cold biopsy forceps. Resected and retrieved.                           - Mild descending colon diverticulosis                           - Internal hemorrhoids. Recommendation:           -  Patient has a contact number available for                            emergencies. The signs and symptoms of potential                            delayed complications were discussed with the                             patient. Return to normal activities tomorrow.                            Written discharge instructions were provided to the                            patient.                           - Resume previous diet.                           - Continue present medications.                           - Resume Plavix (clopidogrel) at prior dose in 2                            days. Refer to managing physician for further                            adjustment of therapy.                           - Await pathology results.                           - Repeat colonoscopy is recommended for                            surveillance in 1-3 years pending pathology review.                           - No aspirin, ibuprofen, naproxen, or other                            non-steroidal anti-inflammatory drugs for 2 weeks                            after polyp removal. Ladene Artist, MD 08/11/2015 3:32:41 PM This report has been signed electronically.

## 2015-08-11 NOTE — Progress Notes (Signed)
Report to PACU, RN, vss, BBS= Clear.  

## 2015-08-12 ENCOUNTER — Telehealth: Payer: Self-pay

## 2015-08-12 NOTE — Telephone Encounter (Signed)
  Follow up Call-  Call back number 08/11/2015  Post procedure Call Back phone  # [phone]  Permission to leave phone message Yes     Patient questions:  Do you have a fever, pain , or abdominal swelling? No. Pain Score  0 *  Have you tolerated food without any problems? Yes.    Have you been able to return to your normal activities? Yes.    Do you have any questions about your discharge instructions: Diet   No. Medications  No. Follow up visit  No.  Do you have questions or concerns about your Care? No.  Actions: * If pain score is 4 or above: No action needed, pain <4.

## 2015-08-13 ENCOUNTER — Ambulatory Visit (HOSPITAL_COMMUNITY): Payer: Medicare Other

## 2015-08-14 ENCOUNTER — Ambulatory Visit (INDEPENDENT_AMBULATORY_CARE_PROVIDER_SITE_OTHER): Payer: Medicare Other

## 2015-08-14 ENCOUNTER — Ambulatory Visit: Payer: Medicare Other | Admitting: Radiation Oncology

## 2015-08-14 DIAGNOSIS — E538 Deficiency of other specified B group vitamins: Secondary | ICD-10-CM

## 2015-08-14 MED ORDER — CYANOCOBALAMIN 1000 MCG/ML IJ SOLN
1000.0000 ug | Freq: Once | INTRAMUSCULAR | Status: AC
  Administered 2015-08-14: 1000 ug via INTRAMUSCULAR

## 2015-08-19 ENCOUNTER — Ambulatory Visit (INDEPENDENT_AMBULATORY_CARE_PROVIDER_SITE_OTHER): Payer: Medicare Other | Admitting: Emergency Medicine

## 2015-08-19 ENCOUNTER — Encounter: Payer: Self-pay | Admitting: Gastroenterology

## 2015-08-19 ENCOUNTER — Encounter: Payer: Self-pay | Admitting: Emergency Medicine

## 2015-08-19 VITALS — BP 112/72 | HR 80 | Ht 70.0 in | Wt 171.0 lb

## 2015-08-19 DIAGNOSIS — C3411 Malignant neoplasm of upper lobe, right bronchus or lung: Secondary | ICD-10-CM | POA: Diagnosis not present

## 2015-08-19 DIAGNOSIS — J439 Emphysema, unspecified: Secondary | ICD-10-CM | POA: Diagnosis not present

## 2015-08-19 NOTE — Patient Instructions (Signed)
Please continue your Advair and Spiriva as you have been taking  Keep ventolin available to use as needed for shortness of breath.  Use duoneb as needed.  Get your CT scan in July as planned.  Follow with Dr Lamonte Sakai in 4 months or sooner if you have any problems.

## 2015-08-19 NOTE — Assessment & Plan Note (Signed)
Please continue your Advair and Spiriva as you have been taking  Keep ventolin available to use as needed for shortness of breath.  Use duoneb as needed.  Follow with Dr Lamonte Sakai in 4 months or sooner if you have any problems.

## 2015-08-19 NOTE — Progress Notes (Signed)
Subjective:  Patient ID: [name], male    DOB: 1940-05-22, 75 y.o.   MRN: [id number] HPI 75 yo smoker (~100pk-yrs), hx of CAD/PTCI, HTN,  prostate CA. Dx with COPD around 10 yrs ago. Last seen 2007 by PW. Maintanance was Advair bid. Previously on Spiriva, stopped in 2008.   ROV 04/06/13 -- severe COPD, seen for an AE in mid December. He got better on pred, then a little more cough. Still smoking. Returns today describing cough and mucous, not as bad as the exacerbation.    ROV 06/04/13 -- severe COPD, also CAD. He reports today that he has had some vertigo that feels like spinning. Happens when he changes position. He has had this before several yrs ago. He is on advair + spiriva. He has had some episodes of dyspnea, had to use his neb x 2 last week. Otherwise doing well. He is still smoking 0.5 pk a day.   05/31/14 Acute OV : severe COPD  Presents for an acute office visit.  Complains of productive cough with pale green mucus, increased SOB with wheezing and chest tightness x 2 weeks.  Denies f/c/s, n/v, hemoptysis and edema  No recnet abx . No ER visit.  No otc used.  >>Levaquin and pred taper   Follow up : Severe COPD 06/28/14 Patient returns for a three-week follow-up. Patient was seen in the office 3 weeks ago for COPD exacerbation. He was treated with Levaquin and a prednisone taper. Patient is feeling much improved with decreased cough and wheezing. Has minimally productive with white mucus.  He does continue smoke. We discussed smoking cessation. Chest x-ray last visit. Head showed a right upper lobe nodule. Repeat chest x-ray today shows a persistent questionable pulmonary nodule in the right upper lobe. No weight loss or hemoptysis  He denies any chest pain, orthopnea, PND, or increased leg swelling  ROV 07/18/14 -- follow-up visit for severe COPD, continued tobacco use. He underwent a chest x-ray 06/28/14 that showed a right upper lobe nodule. A subsequent CT scan of the chest  performed 4/14 showed a 12 mm cavitary right upper lobe nodule without any evidence of associated lymphadenopathy. PET scan performed 07/18/14 shows malignant range hypermetabolism in the right upper lobe nodule without any clear associated hilar or mediastinal adenopathy. No TB exposure, no real cough now. No wt loss - he has actually gained some.    ROV 10/17/14 -- follow-up visit for severe COPD. He continues to smoke. We have been following a right upper lobe 12 mm nodule that is hypermetabolic on PET scan performed 07/18/14. He underwent a repeat CT chest on 10/03/14 that I have personally reviewed.  This shows an interval increase in size in his right upper lobe cavitary nodule which now measures 1.5 x 1.5 cm. He also has stable left upper lobe nodules.  He continues to work 10 days out of the month.  He continues to smoke about 3/4 a pk a day.  He is followed for his CAD by Dr Glennie Hawk, last stent was in 2012.  He reports increased cough and some change in color his sputum to a greenish yellow. No fever. Some continued wheeze  ROV 11/28/14 -- follow-up visit for tobacco use, severe COPD, an enlarging PET positive right upper lobe cavitary nodule as well as some stable left upper lobe nodules. I asked him to be seen by cardiology for screening and in preparation for a possible bronchoscopy. He was seen on 11/19/14 and It was felt that  he was stable to proceed. We discussed in detail today. Have recommended and decided to proceed. He is on plavix, will need to stop for 5-7 days.   ROV 01/07/15 -- follow-up visit for tobacco use, severe COPD and abnormal CT scan of the chest. 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 He has been seen by Dr. Pablo Ledger in radiation oncology regarding SBRT. He is scheduled for PET on 10/24, simulation on 10/26. He is back on plavix.   ROV 02/20/15 -- follow-up visit for severe  COPD, newly diagnosed squamous cell lung cancer. He is undergoing SBRT under the direction of Dr Pablo Ledger.  He underwent PET scan on 01/13/15 that I personally reviewed. He is managed on Spiriva and Advair. Cost is an issue and he is wondering if he can stop the Advair. He is tolerating XRT. Having some cough, some pale green phlegm. Wheezes frequently. Uses SABA 1-2x a day.   ROV 04/17/15 -- follow-up visit for severe COPD and squamous cell lung cancer that has been treated with SBRT. He has been managed on Spiriva and Advair. Last time we discussed changing to once a day medication.  His cough is about the same. He indicates that Anoro and Stiolto are both tier 3 on his insurance. He has CT scan of his chest on 1/24 that showed improvement in his adenopathy and also in his known right upper lobe nodule that was treated with XRT. He also has 2 very small nodules surrounding the known lung cancer of unclear significance. I discussed these with him today and we will plan to follow these with Dr. Pablo Ledger.   ROV 08/19/15 -- history severe COPD, prostate CA, squamous cell lung cancer involving the right upper lobe status post her tachycardic radiation therapy. His most recent CT scan of the chest was done on 07/16/15 and showed a relatively stable right upper lobe cavitary nodule, slightly smaller 3 mm right upper lobe nodule, slightly larger 4 mm right lower lobe nodule. He recently underwent CSY for rectal bleeding, had polypectomies done. He believes that his breathing is stable - he works, has some lower energy. He is stable on Advair and spiriva, works well for him. Uses ventolin about 1-2x a day, sometimes not at all. Occasional wheeze, has a lot of mucous to clear.     Objective:  Physical Exam Filed Vitals:   08/19/15 0928  BP: 112/72  Pulse: 80  Height: '5\' 10"'$  (1.778 m)  Weight: 171 lb (77.565 kg)  SpO2: 97%    Gen: Pleasant man, in no distress,  normal affect  ENT: No lesions,  mouth  clear,  oropharynx clear, no postnasal drip  Neck: No JVD, no TMG, no carotid bruits  Lungs: No use of accessory muscles, coarse w some insp and ext rhonchi, no wheezing  Cardiovascular: RRR, heart sounds normal, no murmur or gallops, no peripheral edema  Musculoskeletal: No deformities, no cyanosis or clubbing  Neuro: alert, non focal  Skin: Warm, no lesions or rashes    07/16/15 --  COMPARISON: CT 04/15/2015, PET-CT 01/13/2015  FINDINGS: Mediastinum/Nodes: No axillary or supraclavicular lymphadenopathy. No mediastinal or hilar lymphadenopathy. No pericardial fluid. Coronary calcifications are present. Esophagus is normal. No central pulmonary embolism.  Lungs/Pleura: Small RIGHT upper lobe angular cavitary nodule measuring 12 mm x 13 mm compares to 15 x 13 mm on prior for no interval enlargement. Subpleural nodule in the RIGHT lower lobe measures 4 mm (  image 56, series 4) which measures slightly larger than 3 mm on comparison exam remeasured.  3 mm nodule in the RIGHT upper lobe (image 39, series 4) decreased from 4 mm.  Upper abdomen: Limited view of the upper abdomen demonstrates a partially calcified lymph node in the gastrohepatic ligament measuring 17 mm and unchanged. Adrenal glands are normal.  Musculoskeletal: No aggressive osseous lesion.  IMPRESSION: 1. Stable angular cavitary nodule in the RIGHT upper lobe. 2. Peripheral nodule in the RIGHT lower lobe measure slightly larger. Recommend attention on follow-up.   Assessment & Plan:  COPD with emphysema ([place]) Please continue your Advair and Spiriva as you have been taking  Keep ventolin available to use as needed for shortness of breath.  Use duoneb as needed.  Follow with Dr Lamonte Sakai in 4 months or sooner if you have any problems.  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 His  next CT scan of the chest is in July.

## 2015-08-19 NOTE — Assessment & Plan Note (Signed)
Undergoing surveillance for his treated right upper lobe nodule as well as small right upper lobe and right lower lobe nodules. His next CT scan of the chest is in July.

## 2015-08-21 ENCOUNTER — Ambulatory Visit (INDEPENDENT_AMBULATORY_CARE_PROVIDER_SITE_OTHER): Payer: Medicare Other

## 2015-08-21 ENCOUNTER — Ambulatory Visit: Payer: Medicare Other

## 2015-08-21 DIAGNOSIS — E538 Deficiency of other specified B group vitamins: Secondary | ICD-10-CM | POA: Diagnosis not present

## 2015-08-21 MED ORDER — CYANOCOBALAMIN 1000 MCG/ML IJ SOLN
1000.0000 ug | Freq: Once | INTRAMUSCULAR | Status: AC
  Administered 2015-08-21: 1000 ug via INTRAMUSCULAR

## 2015-08-27 ENCOUNTER — Other Ambulatory Visit (INDEPENDENT_AMBULATORY_CARE_PROVIDER_SITE_OTHER): Payer: Medicare Other

## 2015-08-27 ENCOUNTER — Ambulatory Visit: Payer: Medicare Other

## 2015-08-27 DIAGNOSIS — E538 Deficiency of other specified B group vitamins: Secondary | ICD-10-CM

## 2015-08-27 LAB — VITAMIN B12: VITAMIN B 12: 835 pg/mL (ref 211–911)

## 2015-09-01 ENCOUNTER — Other Ambulatory Visit: Payer: Medicare Other

## 2015-10-14 ENCOUNTER — Emergency Department
Admission: EM | Admit: 2015-10-14 | Discharge: 2015-10-14 | Payer: Medicare Other | Attending: Emergency Medicine | Admitting: Emergency Medicine

## 2015-10-14 ENCOUNTER — Emergency Department: Payer: Medicare Other

## 2015-10-14 ENCOUNTER — Encounter: Payer: Self-pay | Admitting: Emergency Medicine

## 2015-10-14 ENCOUNTER — Telehealth: Payer: Self-pay | Admitting: Family Medicine

## 2015-10-14 ENCOUNTER — Telehealth: Payer: Self-pay

## 2015-10-14 DIAGNOSIS — Y999 Unspecified external cause status: Secondary | ICD-10-CM | POA: Insufficient documentation

## 2015-10-14 DIAGNOSIS — I361 Nonrheumatic tricuspid (valve) insufficiency: Secondary | ICD-10-CM | POA: Diagnosis not present

## 2015-10-14 DIAGNOSIS — C3490 Malignant neoplasm of unspecified part of unspecified bronchus or lung: Secondary | ICD-10-CM | POA: Diagnosis not present

## 2015-10-14 DIAGNOSIS — F17210 Nicotine dependence, cigarettes, uncomplicated: Secondary | ICD-10-CM | POA: Diagnosis not present

## 2015-10-14 DIAGNOSIS — R911 Solitary pulmonary nodule: Secondary | ICD-10-CM | POA: Diagnosis not present

## 2015-10-14 DIAGNOSIS — S3992XA Unspecified injury of lower back, initial encounter: Secondary | ICD-10-CM | POA: Diagnosis not present

## 2015-10-14 DIAGNOSIS — Y9389 Activity, other specified: Secondary | ICD-10-CM | POA: Insufficient documentation

## 2015-10-14 DIAGNOSIS — I10 Essential (primary) hypertension: Secondary | ICD-10-CM | POA: Diagnosis not present

## 2015-10-14 DIAGNOSIS — J449 Chronic obstructive pulmonary disease, unspecified: Secondary | ICD-10-CM | POA: Insufficient documentation

## 2015-10-14 DIAGNOSIS — I519 Heart disease, unspecified: Secondary | ICD-10-CM | POA: Diagnosis not present

## 2015-10-14 DIAGNOSIS — W19XXXA Unspecified fall, initial encounter: Secondary | ICD-10-CM

## 2015-10-14 DIAGNOSIS — Z8546 Personal history of malignant neoplasm of prostate: Secondary | ICD-10-CM | POA: Diagnosis not present

## 2015-10-14 DIAGNOSIS — Z8673 Personal history of transient ischemic attack (TIA), and cerebral infarction without residual deficits: Secondary | ICD-10-CM | POA: Insufficient documentation

## 2015-10-14 DIAGNOSIS — Z8679 Personal history of other diseases of the circulatory system: Secondary | ICD-10-CM | POA: Diagnosis not present

## 2015-10-14 DIAGNOSIS — Z79899 Other long term (current) drug therapy: Secondary | ICD-10-CM | POA: Diagnosis not present

## 2015-10-14 DIAGNOSIS — J90 Pleural effusion, not elsewhere classified: Secondary | ICD-10-CM | POA: Diagnosis not present

## 2015-10-14 DIAGNOSIS — M19011 Primary osteoarthritis, right shoulder: Secondary | ICD-10-CM | POA: Diagnosis not present

## 2015-10-14 DIAGNOSIS — I252 Old myocardial infarction: Secondary | ICD-10-CM | POA: Diagnosis not present

## 2015-10-14 DIAGNOSIS — S272XXA Traumatic hemopneumothorax, initial encounter: Secondary | ICD-10-CM | POA: Diagnosis not present

## 2015-10-14 DIAGNOSIS — E785 Hyperlipidemia, unspecified: Secondary | ICD-10-CM | POA: Diagnosis not present

## 2015-10-14 DIAGNOSIS — K219 Gastro-esophageal reflux disease without esophagitis: Secondary | ICD-10-CM | POA: Diagnosis not present

## 2015-10-14 DIAGNOSIS — R55 Syncope and collapse: Secondary | ICD-10-CM

## 2015-10-14 DIAGNOSIS — S0990XA Unspecified injury of head, initial encounter: Secondary | ICD-10-CM | POA: Diagnosis not present

## 2015-10-14 DIAGNOSIS — Z959 Presence of cardiac and vascular implant and graft, unspecified: Secondary | ICD-10-CM | POA: Insufficient documentation

## 2015-10-14 DIAGNOSIS — M1990 Unspecified osteoarthritis, unspecified site: Secondary | ICD-10-CM | POA: Diagnosis not present

## 2015-10-14 DIAGNOSIS — Z7982 Long term (current) use of aspirin: Secondary | ICD-10-CM | POA: Diagnosis not present

## 2015-10-14 DIAGNOSIS — S299XXA Unspecified injury of thorax, initial encounter: Secondary | ICD-10-CM | POA: Diagnosis present

## 2015-10-14 DIAGNOSIS — R590 Localized enlarged lymph nodes: Secondary | ICD-10-CM | POA: Diagnosis not present

## 2015-10-14 DIAGNOSIS — S270XXA Traumatic pneumothorax, initial encounter: Secondary | ICD-10-CM | POA: Diagnosis not present

## 2015-10-14 DIAGNOSIS — Y929 Unspecified place or not applicable: Secondary | ICD-10-CM | POA: Diagnosis not present

## 2015-10-14 DIAGNOSIS — S271XXA Traumatic hemothorax, initial encounter: Secondary | ICD-10-CM | POA: Diagnosis not present

## 2015-10-14 DIAGNOSIS — I2510 Atherosclerotic heart disease of native coronary artery without angina pectoris: Secondary | ICD-10-CM | POA: Insufficient documentation

## 2015-10-14 DIAGNOSIS — I720 Aneurysm of carotid artery: Secondary | ICD-10-CM | POA: Diagnosis not present

## 2015-10-14 DIAGNOSIS — Z4682 Encounter for fitting and adjustment of non-vascular catheter: Secondary | ICD-10-CM | POA: Diagnosis not present

## 2015-10-14 DIAGNOSIS — Z720 Tobacco use: Secondary | ICD-10-CM | POA: Diagnosis not present

## 2015-10-14 DIAGNOSIS — I110 Hypertensive heart disease with heart failure: Secondary | ICD-10-CM | POA: Insufficient documentation

## 2015-10-14 DIAGNOSIS — W1809XA Striking against other object with subsequent fall, initial encounter: Secondary | ICD-10-CM | POA: Insufficient documentation

## 2015-10-14 DIAGNOSIS — I714 Abdominal aortic aneurysm, without rupture: Secondary | ICD-10-CM | POA: Diagnosis not present

## 2015-10-14 DIAGNOSIS — I739 Peripheral vascular disease, unspecified: Secondary | ICD-10-CM | POA: Diagnosis not present

## 2015-10-14 DIAGNOSIS — S2241XA Multiple fractures of ribs, right side, initial encounter for closed fracture: Secondary | ICD-10-CM | POA: Diagnosis not present

## 2015-10-14 DIAGNOSIS — S199XXA Unspecified injury of neck, initial encounter: Secondary | ICD-10-CM | POA: Diagnosis not present

## 2015-10-14 DIAGNOSIS — Z955 Presence of coronary angioplasty implant and graft: Secondary | ICD-10-CM | POA: Diagnosis not present

## 2015-10-14 DIAGNOSIS — Z85118 Personal history of other malignant neoplasm of bronchus and lung: Secondary | ICD-10-CM | POA: Diagnosis not present

## 2015-10-14 DIAGNOSIS — S4991XA Unspecified injury of right shoulder and upper arm, initial encounter: Secondary | ICD-10-CM | POA: Diagnosis not present

## 2015-10-14 DIAGNOSIS — D62 Acute posthemorrhagic anemia: Secondary | ICD-10-CM | POA: Diagnosis not present

## 2015-10-14 DIAGNOSIS — R918 Other nonspecific abnormal finding of lung field: Secondary | ICD-10-CM | POA: Diagnosis not present

## 2015-10-14 DIAGNOSIS — I350 Nonrheumatic aortic (valve) stenosis: Secondary | ICD-10-CM | POA: Diagnosis not present

## 2015-10-14 DIAGNOSIS — Z882 Allergy status to sulfonamides status: Secondary | ICD-10-CM | POA: Diagnosis not present

## 2015-10-14 DIAGNOSIS — J939 Pneumothorax, unspecified: Secondary | ICD-10-CM | POA: Diagnosis not present

## 2015-10-14 DIAGNOSIS — Z9911 Dependence on respirator [ventilator] status: Secondary | ICD-10-CM | POA: Diagnosis not present

## 2015-10-14 DIAGNOSIS — I509 Heart failure, unspecified: Secondary | ICD-10-CM | POA: Insufficient documentation

## 2015-10-14 DIAGNOSIS — I444 Left anterior fascicular block: Secondary | ICD-10-CM | POA: Diagnosis not present

## 2015-10-14 DIAGNOSIS — Z7902 Long term (current) use of antithrombotics/antiplatelets: Secondary | ICD-10-CM | POA: Diagnosis not present

## 2015-10-14 DIAGNOSIS — S2249XA Multiple fractures of ribs, unspecified side, initial encounter for closed fracture: Secondary | ICD-10-CM | POA: Diagnosis not present

## 2015-10-14 DIAGNOSIS — M50322 Other cervical disc degeneration at C5-C6 level: Secondary | ICD-10-CM | POA: Diagnosis not present

## 2015-10-14 DIAGNOSIS — R0989 Other specified symptoms and signs involving the circulatory and respiratory systems: Secondary | ICD-10-CM | POA: Diagnosis not present

## 2015-10-14 DIAGNOSIS — S3993XA Unspecified injury of pelvis, initial encounter: Secondary | ICD-10-CM | POA: Diagnosis not present

## 2015-10-14 LAB — CBC
HEMATOCRIT: 44.8 % (ref 40.0–52.0)
Hemoglobin: 15.2 g/dL (ref 13.0–18.0)
MCH: 32.6 pg (ref 26.0–34.0)
MCHC: 34 g/dL (ref 32.0–36.0)
MCV: 95.9 fL (ref 80.0–100.0)
Platelets: 278 10*3/uL (ref 150–440)
RBC: 4.67 MIL/uL (ref 4.40–5.90)
RDW: 12.7 % (ref 11.5–14.5)
WBC: 12.1 10*3/uL — ABNORMAL HIGH (ref 3.8–10.6)

## 2015-10-14 LAB — BASIC METABOLIC PANEL
Anion gap: 9 (ref 5–15)
BUN: 15 mg/dL (ref 6–20)
CHLORIDE: 100 mmol/L — AB (ref 101–111)
CO2: 26 mmol/L (ref 22–32)
CREATININE: 0.97 mg/dL (ref 0.61–1.24)
Calcium: 9.5 mg/dL (ref 8.9–10.3)
GFR calc Af Amer: >60 mL/min (ref >60)
GFR calc non Af Amer: >60 mL/min (ref >60)
GLUCOSE: 114 mg/dL — AB (ref 65–99)
POTASSIUM: 3.8 mmol/L (ref 3.5–5.1)
Sodium: 135 mmol/L (ref 135–145)

## 2015-10-14 LAB — URINALYSIS COMPLETE WITH MICROSCOPIC (ARMC ONLY)
BACTERIA UA: NONE SEEN
Bilirubin Urine: NEGATIVE
Glucose, UA: NEGATIVE mg/dL
Hgb urine dipstick: NEGATIVE
LEUKOCYTES UA: NEGATIVE
Nitrite: NEGATIVE
PROTEIN: NEGATIVE mg/dL
SPECIFIC GRAVITY, URINE: 1.018 (ref 1.005–1.030)
pH: 5 (ref 5.0–8.0)

## 2015-10-14 LAB — TROPONIN I

## 2015-10-14 MED ORDER — MORPHINE SULFATE (PF) 4 MG/ML IV SOLN
4.0000 mg | Freq: Once | INTRAVENOUS | Status: AC
  Administered 2015-10-14: 4 mg via INTRAVENOUS
  Filled 2015-10-14: qty 1

## 2015-10-14 MED ORDER — ONDANSETRON HCL 4 MG/2ML IJ SOLN
4.0000 mg | Freq: Once | INTRAMUSCULAR | Status: AC
  Administered 2015-10-14: 4 mg via INTRAVENOUS
  Filled 2015-10-14: qty 2

## 2015-10-14 NOTE — Telephone Encounter (Signed)
Patient Name: MINARD MILLIRONS DOB: [birth year] Initial Comment Caller says this morning he went tot he restroom, and passed out, he fell, and thinks he has a injured rib. He wants to get it X-rayed Nurse Assessment Nurse: Roosvelt Maser, RN, Barnetta Chapel Date/Time (Eastern Time): 10/14/2015 8:10:32 AM Confirm and document reason for call. If symptomatic, describe symptoms. You must click the next button to save text entered. ---caller states he passed out going to the bathroom and fell over and hurt his rib. Has the patient traveled out of the country within the last 30 days? ---Not Applicable Does the patient have any new or worsening symptoms? ---Yes Will a triage be completed? ---Yes Related visit to physician within the last 2 weeks? ---N/A Does the PT have any chronic conditions? (i.e. diabetes, asthma, etc.) ---Yes List chronic conditions. ---COPD, lung cancer Is this a behavioral health or substance abuse call? ---No Guidelines Guideline Title Affirmed Question Affirmed Notes Fainting History of heart problems (e.g., congestive heart failure, heart attack) Final Disposition User Call EMS 911 Now Roosvelt Maser, RN, Barnetta Chapel Disagree/Comply: Comply

## 2015-10-14 NOTE — Telephone Encounter (Signed)
PLEASE NOTE: All timestamps contained within this report are represented as [place] Standard Time. CONFIDENTIALTY NOTICE: This fax transmission is intended only for the addressee. It contains information that is legally privileged, confidential or otherwise protected from use or disclosure. If you are not the intended recipient, you are strictly prohibited from reviewing, disclosing, copying using or disseminating any of this information or taking any action in reliance on or regarding this information. If you have received this fax in error, please notify us immediately by telephone so that we can arrange for its return to [place]. Phone: [phone], Toll-Free: [phone], Fax: [phone] Page: 1 of 2 Call Id: [id number] [place] Patient Name: [name] Gender: Male DOB: [birth year] Age: 75 Y 27 M 25 D Return Phone Number: [phone] (Primary), [phone] (Secondary) Address: City/State/Zip: [place] Client [place] [facility] - Client Client Site [facility], Roque Lias - MD Contact Type Call Who Is Calling Patient / Member / Family / Caregiver Call Type Triage / Clinical Relationship To Patient Self Return Phone Number [phone] (Primary) Chief Complaint FAINTING or [facility] Reason for Call Symptomatic / Request for [place] says this morning he went tot he restroom, and passed out, he fell, and thinks he has a injured rib. He wants to get it X-rayed Appointment Disposition EMR Appointment Not Necessary Info pasted into Epic Yes PreDisposition Did not know what to do Translation No Nurse Assessment Nurse: Roosvelt Maser, RN, Barnetta Chapel Date/Time (Eastern Time): 10/14/2015 8:10:32 AM Confirm and document reason for call. If symptomatic, describe symptoms. You must click the next button to save  text entered. ---caller states he passed out going to the bathroom and fell over and hurt his rib. Has the patient traveled out of the country within the last 30 days? ---Not Applicable Does the patient have any new or worsening symptoms? ---Yes Will a triage be completed? ---Yes Related visit to physician within the last 2 weeks? ---N/A Does the PT have any chronic conditions? (i.e. diabetes, asthma, etc.) ---Yes List chronic conditions. ---COPD, lung cancer Is this a behavioral health or substance abuse call? ---No Guidelines Guideline Title Affirmed Question Affirmed Notes Nurse Date/Time (Eastern Time) Fainting History of heart problems (e.g., congestive heart failure, heart attack) Roosvelt Maser, RN, Barnetta Chapel 10/14/2015 8:11:52 AM Disp. Time Eilene Ghazi Time) Disposition Final User 10/14/2015 8:08:18 AM Send to Urgent Neta Ehlers PLEASE NOTE: All timestamps contained within this report are represented as [place] Standard Time. CONFIDENTIALTY NOTICE: This fax transmission is intended only for the addressee. It contains information that is legally privileged, confidential or otherwise protected from use or disclosure. If you are not the intended recipient, you are strictly prohibited from reviewing, disclosing, copying using or disseminating any of this information or taking any action in reliance on or regarding this information. If you have received this fax in error, please notify us immediately by telephone so that we can arrange for its return to [place]. Phone: [phone], Toll-Free: [phone], Fax: [phone] Page: 2 of 2 Call Id: [id number] [place]. Time Eilene Ghazi Time) Disposition Final User 10/14/2015 8:18:22 AM 911 Outcome Documentation [place], RN, Barnetta Chapel Reason: dtr states he will not have her call ems she will drive him up to the hospital 10/14/2015 8:17:34 AM Call EMS 911 Now Yes Roosvelt Maser, RN, Ledell Noss Understands: Yes Disagree/Comply: Comply Care Advice  Given Per Guideline CALL EMS 911 NOW: Immediate medical attention  is needed. You need to hang up and call 911 (or an ambulance). ([facility] Discretion: I'll call you back in a few minutes to be sure you were able to reach them.) CARE ADVICE given per [place]ain[place] (Adult) guideline.

## 2015-10-14 NOTE — ED Triage Notes (Signed)
Pt states around 545am this morning he went to use the BR and passed out , pt states for less then a minute.. Pt c/o right rib and shoulder pain.. Denies hitting his head but is currently on plavix.Marland Kitchen

## 2015-10-14 NOTE — ED Provider Notes (Signed)
[facility] Emergency Department Provider Note  Time seen: 9:05 AM  I have reviewed the triage vital signs and the nursing notes.   HISTORY  Chief Complaint Loss of Consciousness    HPI [name] is a 75 y.o. male with a past medical history of hyperlipidemia, hypertension, MI, COPD, who presents to the emergency department after a fall and syncopal episode. According to the patient he got up this morning to go urinate. While urinating he began feeling very lightheaded, dizzy and fell to the ground. Denies complete loss of consciousness, denies hitting his head. The patient does take aspirin and Plavix. Patient's only complaint is right-sided rib pain and hitting the bathtub. Denies any trouble breathing. Patient states he also had a syncopal episode 3 weeks ago while urinating, prior to that he denies any previous syncopal episodes. Denies any chest pain at any time. States he feels well, but describes 4/10 moderate dull right-sided chest pain worse with movement or deep inspiration.     Past Medical History:  Diagnosis Date  . AAA (abdominal aortic aneurysm) (HCC)    4.2 cm IR AAA noted on 01/13/15 PET scan  . Anxiety   . CAD (coronary artery disease)    AMI 1997 w/ BMS x 2 LAD, BMS CFX 2002, DES x 2 LAD 2009, DES CFX & OM 2012   . Carotid artery disease ([place])    RICA CEA 6659, LICA CEA 9357 per Dr. [name],   . CHF (congestive heart failure) ([place])    pt denied in 2014  . COPD (chronic obstructive pulmonary disease) ([place])   . GERD (gastroesophageal reflux disease)    occ  . HTN (hypertension)   . Hydradenitis   . Hyperlipidemia   . Ischemic cardiomyopathy    EF 40-45 percent at cath 2012  . Myocardial infarction ([place])   . Non-small cell lung cancer (NSCLC) ([place]) dx'd 12/2014   SBRT  . Osteoarthritis    pt. denies 01/17/2015  . PAC (premature atrial contraction)   . Peripheral vascular disease ([place])   . Pneumonia   . Prostate cancer ([place])  dx'd approx 2008   xrt throughfoley  . Shortness of breath dyspnea   . Stroke [facility]) 11-24-11   "no feeling in right 5th digit"  . Urinary frequency   . Urinary urgency     Patient Active Problem List   Diagnosis Date Noted  . B12 deficiency 07/22/2015  . UTI (urinary tract infection) 07/15/2015  . Anemia 07/15/2015  . Hematuria 07/08/2015  . Unintentional weight loss 07/08/2015  . Orthostatic hypotension 07/08/2015  . COPD with emphysema ([place]) 05/22/2015  . Cough 05/22/2015  . Tobacco user 05/22/2015  . Mediastinal lymphadenopathy 01/22/2015  . 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 S/P bronchoscopy with biopsy   . Colon cancer screening 11/06/2014  . Encounter for Medicare annual wellness exam 11/06/2014  . Routine general medical examination at a health care facility 11/06/2014  . Acute bronchitis 10/17/2014  . Lung nodule 06/28/2014  . Carotid stenosis 12/20/2013  . Aftercare following surgery of the circulatory system 12/20/2013  . Aftercare following surgery of the circulatory system, [place] 12/19/2012  . Personal history of colonic polyps 08/28/2012  . Hyperglycemia 08/07/2012  . Boils 04/03/2012  . Intertrigo 04/03/2012  . Pre-operative cardiovascular examination 11/03/2011  . Carotid artery disease ([place]) 05/20/2011  . Grief reaction 05/07/2011  . Ischemic cardiomyopathy 01/13/2011  . Palpitations 11/10/2010  . Occlusion and stenosis of carotid artery  without mention of cerebral infarction 04/02/2010  . Anxiety disorder 12/12/2009  . HYPERTENSION, BENIGN 09/29/2008  . History of prostate cancer 09/19/2008  . Hyperlipidemia 02/10/2007  . AMAUROSIS FUGAX 02/10/2007  . MYOCARDIAL INFARCTION, HX OF 02/10/2007  . Coronary atherosclerosis 02/10/2007  . PERIPHERAL VASCULAR DISEASE 02/10/2007  . HEMORRHOIDS 02/10/2007  . Chronic obstructive pulmonary disease ([place]) 02/10/2007  . OSTEOARTHRITIS 02/10/2007  . Nicotine dependence 02/10/2007  . PURE  HYPERCHOLESTEROLEMIA 01/27/2007  . CORONARY ATHEROSCLEROSIS NATIVE CORONARY ARTERY 01/27/2007    Past Surgical History:  Procedure Laterality Date  . CARDIAC CATHETERIZATION    . CARDIOVASCULAR STRESS TEST  12-2006  . CAROTID ENDARTERECTOMY Right 03-2000   CE  . CAROTID ENDARTERECTOMY Left 11-24-11   CE  . COLONOSCOPY  04-2001   polyp  . [place], 2002, 2009, 2012   8 stents total  . [place]   neg  . ENDARTERECTOMY  11/24/2011   Procedure: ENDARTERECTOMY CAROTID;  Surgeon: Rosetta Posner, MD;  Location: [facility];  Service: Vascular;  Laterality: Left;  . EYE SURGERY Bilateral Aug. 17, 2015   Cataract  . FUDUCIAL PLACEMENT N/A 12/25/2014   Procedure: PLACEMENT OF FUDUCIAL;  Surgeon: Collene Gobble, MD;  Location: [facility];  Service: Thoracic;  Laterality: N/A;  . PROSTATE SURGERY     Radiation Tx  X's 40  . VASCULAR SURGERY    . VIDEO BRONCHOSCOPY WITH ENDOBRONCHIAL NAVIGATION N/A 12/25/2014   Procedure: VIDEO BRONCHOSCOPY WITH ENDOBRONCHIAL NAVIGATION  with Biopsies and Brushings;  Surgeon: Collene Gobble, MD;  Location: [facility];  Service: Thoracic;  Laterality: N/A;  . VIDEO BRONCHOSCOPY WITH ENDOBRONCHIAL ULTRASOUND N/A 01/22/2015   Procedure: VIDEO BRONCHOSCOPY WITH ENDOBRONCHIAL ULTRASOUND with Biopsies;  Surgeon: Collene Gobble, MD;  Location: [facility];  Service: Thoracic;  Laterality: N/A;    Current Outpatient Rx  . Order #: [id number] Class: Print  . Order #: [id number] Class: Historical Med  . Order #: [id number] Class: No Print  . Order #: [id number] Class: Sample  . Order #: [id number] Class: Normal  . Order #: [id number] Class: Normal  . Order #: [id number] Class: Normal  . Order #: [id number] Class: Historical Med  . Order #: [id number] Class: Normal  . Order #: [id number] Class: Normal  . Order #: [id number] Class: Historical Med  . Order #: [id number] Class: Normal  . Order #: [id number] Class: Normal    Allergies Augmentin [amoxicillin-pot clavulanate];  Sulfonamide derivatives; and Zantac [ranitidine hcl]  Family History  Problem Relation Age of Onset  . Stroke Father   . Heart disease Father   . Heart disease Mother     pacemaker  . Other Brother     benign brain tumor  . Cancer Brother     Eye-behind  . Cancer Sister     Breast  . Liver cancer Brother   . Diabetes Maternal Grandmother     Social History Social History  Substance Use Topics  . Smoking status: Current Every Day Smoker    Packs/day: 0.50    Years: 55.00    Types: Cigarettes  . Smokeless tobacco: Never Used     Comment: 1/2 ppd (01/07/15)  . Alcohol use 0.0 oz/week     Comment: occ    Review of Systems Constitutional: Negative for fever. Cardiovascular: Right lateral chest pain Respiratory: Negative for shortness of breath. Gastrointestinal: Negative for abdominal pain Musculoskeletal: Negative for back pain. Neurological: Negative for headache 10-point ROS otherwise negative.  ____________________________________________   PHYSICAL EXAM:  VITAL SIGNS: ED Triage Vitals  Enc Vitals Group     BP 10/14/15 0849 (!) 155/81     Pulse Rate 10/14/15 0849 87     Resp 10/14/15 0849 16     Temp 10/14/15 0849 97.9 F (36.6 C)     Temp Source 10/14/15 0849 Oral     SpO2 10/14/15 0849 99 %     Weight 10/14/15 0849 165 lb (74.8 kg)     Height 10/14/15 0849 '5\' 9"'$  (1.753 m)     Head Circumference --      Peak Flow --      Pain Score 10/14/15 0850 8     Pain Loc --      Pain Edu? --      Excl. in [place]? --     Constitutional: Alert and oriented. Well appearing and in no distress. Eyes: Normal exam ENT   Head: Normocephalic and atraumatic.   Mouth/Throat: Mucous membranes are moist. Cardiovascular: Normal rate, regular rhythm. No murmur Respiratory: Normal respiratory effort without tachypnea nor retractions. Mild wheeze bilaterally. Mild right lateral chest tenderness to palpation. No obvious ecchymosis. Gastrointestinal: Soft and nontender. No  distention.  Specific attention paid to right upper quadrant without tenderness to palpation. Musculoskeletal: Nontender with normal range of motion in all extremities. Neurologic:  Normal speech and language. No gross focal neurologic deficits  Skin:  Skin is warm, dry and intact.  Psychiatric: Mood and affect are normal.   ____________________________________________    EKG  EKG reviewed and interpreted by myself shows normal sinus rhythm at 90 bpm, slightly widened QRS, normal axis, Larson normal intervals, nonspecific ST changes, no ST elevations.  ____________________________________________    RADIOLOGY  X-ray consistent multiple right-sided rib fractures, small hemostat pneumothorax, progression of nodular disease.  ____________________________________________   INITIAL IMPRESSION / ASSESSMENT AND PLAN / ED COURSE  Pertinent labs & imaging results that were available during my care of the patient were reviewed by me and considered in my medical decision making (see chart for details).  The patient presents the emergency department after a near syncopal episode and fall with right-sided rib pain. Currently the patient appears well, no distress. Mild right lateral chest tenderness to palpation. We'll obtain right-sided rib films and a chest x-ray, as well as labs and an EKG. Patient denies hitting his head, exam is atraumatic, denies any headache.  Given the multiple rib fractures with associated pneumo/hemothorax I discussed the patient with surgery who recommends transfer to a trauma center for further treatment. I discussed this with the patient, he is agreeable. We'll dose pain medication, transfer to a trauma center for further treatment and workup.  ____________________________________________   FINAL CLINICAL IMPRESSION(S) / ED DIAGNOSES  Fall Syncope Pneumothorax Hemothorax Multiple rib fractures   Harvest Dark, MD 10/14/15 1053

## 2015-10-16 ENCOUNTER — Ambulatory Visit (HOSPITAL_COMMUNITY): Payer: Medicare Other

## 2015-10-16 ENCOUNTER — Ambulatory Visit: Payer: Medicare Other | Attending: Radiation Oncology

## 2015-10-17 ENCOUNTER — Ambulatory Visit: Payer: Self-pay | Admitting: Radiation Oncology

## 2015-10-21 ENCOUNTER — Telehealth: Payer: Self-pay | Admitting: Family Medicine

## 2015-10-21 NOTE — Telephone Encounter (Signed)
Pt called stating he was to have ct scan done on 10/16/15.  This had to be canceled due to fall 7/24.  He has 5 broken ribs and they did ct scan @ [facility].  He was discharged 10/17/15.  He wanted to know if he still needed to have the ct scan done that was canceled on 7/27 or did the ct scan he had at [facility] scan for same thing.  [facility] to dr tower Pt has appointment 10/24/15 with Anda Kraft for hospital follow up

## 2015-10-21 NOTE — Telephone Encounter (Signed)
I think he means CT of the chest -originally ordered by pulmonary, correct ? Dr Lamonte Sakai?  He will have to ask pulmonary to review the CT he had at the outside hospital to see if it saw enough of what they wanted with the right detail

## 2015-10-22 NOTE — Telephone Encounter (Signed)
Pt notified of Dr. Marliss Coots comments and verbalized understanding, pt said it was Dr. Pablo Ledger who ordered the 3 month f/u CT scan so I advise pt to check with her office to see if that CT scan he had done at [facility] is okay or does he need to get another scan for her office

## 2015-10-23 ENCOUNTER — Telehealth: Payer: Self-pay | Admitting: *Deleted

## 2015-10-23 NOTE — Telephone Encounter (Signed)
CALLED PATIENT TO INFORM OF CT FOR 10-27-15 - ARRIVAL TIME - 2:15 PM @ [facility], PATIENT TO HAVE CLEAR LIQUIDS ONLY, SPOKE WITH PATIENT AND HE IS AWARE OF THIS TEST.

## 2015-10-24 ENCOUNTER — Encounter: Payer: Self-pay | Admitting: Primary Care

## 2015-10-24 ENCOUNTER — Ambulatory Visit (INDEPENDENT_AMBULATORY_CARE_PROVIDER_SITE_OTHER): Payer: Medicare Other | Admitting: Primary Care

## 2015-10-24 ENCOUNTER — Ambulatory Visit (INDEPENDENT_AMBULATORY_CARE_PROVIDER_SITE_OTHER)
Admission: RE | Admit: 2015-10-24 | Discharge: 2015-10-24 | Disposition: A | Discharge status: Home / Self Care | Payer: Medicare Other | Source: Ambulatory Visit | Attending: Primary Care | Admitting: Primary Care

## 2015-10-24 VITALS — BP 100/64 | HR 98 | Temp 97.5°F | Ht 69.0 in | Wt 161.0 lb

## 2015-10-24 DIAGNOSIS — R109 Unspecified abdominal pain: Secondary | ICD-10-CM

## 2015-10-24 DIAGNOSIS — S2231XA Fracture of one rib, right side, initial encounter for closed fracture: Secondary | ICD-10-CM

## 2015-10-24 DIAGNOSIS — I714 Abdominal aortic aneurysm, without rupture: Secondary | ICD-10-CM | POA: Diagnosis not present

## 2015-10-24 MED ORDER — PREGABALIN 25 MG PO CAPS: 25.0000 mg | ORAL_CAPSULE | Freq: Two times a day (BID) | ORAL | 0 refills | Status: DC

## 2015-10-24 NOTE — Progress Notes (Signed)
Subjective:    Patient ID: [name], male    DOB: [birth year], 75 y.o.   MRN: [id number]  HPI  Mr. Ardito is a 75 year old male who presents today for hospital follow up. He presented to [facility] ED on 07/25 with a chief complaint of nearl syncopal episode and fall. He was getting up to the bathroom to urinate, felt dizzy and light headed, and fell to the ground hitting his right chest against the bathtub.    During his stay at [facility] ED he under went imaging which resulted in multiple right sided rib fractures (right 8th-10th, and 11th and 12th) and small hemopneumothorax. He was then transferred to the nearest trauma center for further evaluation given multiple injuries, risk and age. He was then transferred to [facility] emergency department.  During his stay in the [facility] ED he underwent treatment with IV pain medications and IV fluids, and monitoring. He was admitted to a stepdown unit for further evaluation. Repeat chest xray on 07/26 without evidence of pneumothorax. He underwent CT imaging which confirmed fractures. He underwent OT and PT during his hospitalization at [facility]. His pain was well managed in the hospital with IV medication and he felt stable for discharge home. Prior to discharge home labs stable.  He was discharged home on 07/31. Since his return home he continues to experience pain to the right chest wall. He was provided with a prescription for oxycodone and Lyrica that has helped reduction in pain, but causes drowsiness. His metoprolol 12.5 mg twice a day was discontinued due to hypotension.   He also notes a "burning" sensation to the right lower periumbilical region that has been present for 3-4 weeks (prior to his fall). The burning is intermittent daily. He mentioned this to the physicians at [facility] but no further workup was completed. Denies fatigue, fevers, nausea, vomiting, diarrhea, urinary frequency, dysuria, shortness of breath.    Review of Systems  Constitutional: Negative for  fatigue and fever.  Respiratory: Negative for shortness of breath.   Cardiovascular: Negative for chest pain.  Gastrointestinal: Negative for blood in stool, nausea and vomiting.       Burning to right periumbilical region.  Genitourinary: Negative for difficulty urinating, dysuria, flank pain and frequency.  Neurological: Negative for weakness.       Past Medical History:  Diagnosis Date  . AAA (abdominal aortic aneurysm) (HCC)    4.2 cm IR AAA noted on 01/13/15 PET scan  . Anxiety   . CAD (coronary artery disease)    AMI 1997 w/ BMS x 2 LAD, BMS CFX 2002, DES x 2 LAD 2009, DES CFX & OM 2012   . Carotid artery disease ([place])    RICA CEA 7829, LICA CEA 5621 per Dr. [name],   . CHF (congestive heart failure) ([place])    pt denied in 2014  . COPD (chronic obstructive pulmonary disease) ([place])   . GERD (gastroesophageal reflux disease)    occ  . HTN (hypertension)   . Hydradenitis   . Hyperlipidemia   . Ischemic cardiomyopathy    EF 40-45 percent at cath 2012  . Myocardial infarction ([place])   . Non-small cell lung cancer (NSCLC) ([place]) dx'd 12/2014   SBRT  . Osteoarthritis    pt. denies 01/17/2015  . PAC (premature atrial contraction)   . Peripheral vascular disease ([place])   . Pneumonia   . Prostate cancer ([place]) dx'd approx 2008   xrt throughfoley  . Shortness of breath dyspnea   .  Stroke [facility]) 11-24-11   "no feeling in right 5th digit"  . Urinary frequency   . Urinary urgency      Social History   Social History  . Marital status: Widowed    Spouse name: N/A  . Number of children: 5  . Years of education: N/A   Occupational History  . Retired Airline pilot   . Part time, delivering [employer]   Social History Main Topics  . Smoking status: Current Every Day Smoker    Packs/day: 0.50    Years: 55.00    Types: Cigarettes  . Smokeless tobacco: Never Used     Comment: 1/2 ppd (01/07/15)  . Alcohol use 0.0 oz/week     Comment: occ  . Drug use: No  . Sexual  activity: Not on file   Other Topics Concern  . Not on file   Social History Narrative  . No narrative on file    Past Surgical History:  Procedure Laterality Date  . CARDIAC CATHETERIZATION    . CARDIOVASCULAR STRESS TEST  12-2006  . CAROTID ENDARTERECTOMY Right 03-2000   CE  . CAROTID ENDARTERECTOMY Left 11-24-11   CE  . COLONOSCOPY  04-2001   polyp  . [place], 2002, 2009, 2012   8 stents total  . [place]   neg  . ENDARTERECTOMY  11/24/2011   Procedure: ENDARTERECTOMY CAROTID;  Surgeon: Rosetta Posner, MD;  Location: [place];  Service: Vascular;  Laterality: Left;  . EYE SURGERY Bilateral Aug. 17, 2015   Cataract  . FUDUCIAL PLACEMENT N/A 12/25/2014   Procedure: PLACEMENT OF FUDUCIAL;  Surgeon: Collene Gobble, MD;  Location: [place];  Service: Thoracic;  Laterality: N/A;  . PROSTATE SURGERY     Radiation Tx  X's 40  . VASCULAR SURGERY    . VIDEO BRONCHOSCOPY WITH ENDOBRONCHIAL NAVIGATION N/A 12/25/2014   Procedure: VIDEO BRONCHOSCOPY WITH ENDOBRONCHIAL NAVIGATION  with Biopsies and Brushings;  Surgeon: Collene Gobble, MD;  Location: [place];  Service: Thoracic;  Laterality: N/A;  . VIDEO BRONCHOSCOPY WITH ENDOBRONCHIAL ULTRASOUND N/A 01/22/2015   Procedure: VIDEO BRONCHOSCOPY WITH ENDOBRONCHIAL ULTRASOUND with Biopsies;  Surgeon: Collene Gobble, MD;  Location: [facility];  Service: Thoracic;  Laterality: N/A;    Family History  Problem Relation Age of Onset  . Stroke Father   . Heart disease Father   . Heart disease Mother     pacemaker  . Other Brother     benign brain tumor  . Cancer Brother     Eye-behind  . Cancer Sister     Breast  . Liver cancer Brother   . Diabetes Maternal Grandmother     Allergies  Allergen Reactions  . Augmentin [Amoxicillin-Pot Clavulanate] Nausea And Vomiting    Diarrhea  . Sulfonamide Derivatives Other (See Comments)    Pt do not remember 8-30 md  . Zantac [Ranitidine Hcl] Diarrhea    Current  Outpatient Prescriptions on File Prior to Visit  Medication Sig Dispense Refill  . albuterol (PROVENTIL HFA;VENTOLIN HFA) 108 (90 Base) MCG/ACT inhaler Inhale 2 puffs into the lungs every 4 (four) hours as needed for wheezing or shortness of breath.    . ALPRAZolam (XANAX) 0.5 MG tablet Take 1 tablet (0.5 mg total) by mouth at bedtime as needed. Sleep or anxiety (Patient taking differently: Take 0.25 mg by mouth at bedtime as needed. Sleep or anxiety) 30 tablet 1  . aspirin EC 81 MG tablet  Take 81 mg by mouth at bedtime.     . clopidogrel (PLAVIX) 75 MG tablet Take 1 tablet (75 mg total) by mouth daily. You may restart this medication on 12/27/14 (Patient taking differently: Take 75 mg by mouth at bedtime. )    . Fluticasone-Salmeterol (ADVAIR DISKUS) 500-50 MCG/DOSE AEPB Inhale 1 puff into the lungs 2 (two) times daily. 3 each 0  . ipratropium-albuterol (DUONEB) 0.5-2.5 (3) MG/3ML SOLN INHALE CONTENTS OF 1 VIAL EVERY 4 HOURS AS NEEDED (Patient taking differently: Take 3 mLs by nebulization every 4 (four) hours as needed. For shortness of breath/wheezing) 180 mL 3  . loratadine (CLARITIN) 10 MG tablet Take 10 mg by mouth daily.    . nitroGLYCERIN (NITROSTAT) 0.4 MG SL tablet Place 1 tablet (0.4 mg total) under the tongue every 5 (five) minutes as needed. For chest pain (Patient taking differently: Place 0.4 mg under the tongue every 5 (five) minutes as needed for chest pain. ) 25 tablet 6  . Tamsulosin HCl (FLOMAX) 0.4 MG CAPS Take 0.4 mg by mouth daily.     [place] Kitchen tiotropium (SPIRIVA HANDIHALER) 18 MCG inhalation capsule INHALE 1 CAPSULE VIA HANDIHALER ONCE DAILY AT THE SAME TIME EVERY DAY (Patient taking differently: Place 18 mcg into inhaler and inhale daily. ) 30 capsule 5  . metoprolol tartrate (LOPRESSOR) 25 MG tablet Take 12.5 mg by mouth 2 (two) times daily.     No current facility-administered medications on file prior to visit.     BP 100/64   Pulse 98   Temp 97.5 F (36.4 C) (Oral)   Ht 5'  9" (1.753 m)   Wt 161 lb (73 kg)   SpO2 94%   BMI 23.78 kg/m    Objective:   Physical Exam  Constitutional: He is oriented to person, place, and time. He appears well-nourished. He does not appear ill.  Neck: Neck supple.  Cardiovascular: Normal rate and regular rhythm.   Pulmonary/Chest: Effort normal and breath sounds normal.  Equal chest rise and fall.  Abdominal: Soft. Bowel sounds are normal. There is no tenderness.  Protrusion of naval.  Neurological: He is alert and oriented to person, place, and time.  Skin: Skin is warm and dry.  Nearly healed bruising to right lateral chest wall and right lower quadrant of abdomen.  Psychiatric: He has a normal mood and affect.          Assessment & Plan:  Hospital follow-up:  Presented to [facility] on July 25 after a fall with right chest wall pain. Multiple right-sided rib fractures noted with small hemopneumothorax. Given his age and comorbidities he was transferred to the  trauma center at [facility]. Repeat chest x-ray at [facility] without mention of hemopneumothorax. Underwent IV pain medication and monitoring. Labs prior to discharge stable. Discharged home on July 31.  Overall feeling well except for right-sided rib pain. Oxycodone and Lyrica or working to help reduce pain. He is asking for refill of Lyrica as he does notice a difference without the medication. Short-term refill provided. Exam today with nearly healed bruising to the right lateral chest wall. Abdomen with nearly healed bruising to right upper quadrant. Abdominal exam unremarkable given patient's concerns for burning sensation.  Suspect burning sensation could be from radiation treatment as all of his labs are unremarkable. Abdominal plain films obtained today with evidence of constipation. He will meet with his oncologist next week to also mention the burning sensation which could be related to radiation treatment. He will follow-up with  myself or PCP if burning sensation becomes  persistent and does not resolve. He does not appear acutely ill or in distress. Initially tachycardic which improved prior to leaving today. His metoprolol was discontinued in the hospital due to hypotension.  Discussed importance of  incentive spirometry use. Fall risk discussed regarding oxycodone and Lyrica use.  All Hospital labs, notes, imaging reviewed. Sheral Flow, NP

## 2015-10-24 NOTE — Patient Instructions (Addendum)
Complete xray(s) prior to leaving today. I will notify you of your results once received.  Continue taking oxycodone and lyrica as needed for rib pain. Remember to complete deep breathing exercises every 2 hours to keep the lungs expanded.  Please follow up with Dr. Glori Bickers for clearance to return to work.  It was a pleasure meeting you!

## 2015-10-24 NOTE — Progress Notes (Signed)
Pre visit review using our clinic review tool, if applicable. No additional management support is needed unless otherwise documented below in the visit note. 

## 2015-10-27 ENCOUNTER — Encounter: Payer: Self-pay | Admitting: Oncology

## 2015-10-27 ENCOUNTER — Ambulatory Visit (HOSPITAL_COMMUNITY)
Admission: RE | Admit: 2015-10-27 | Discharge: 2015-10-27 | Disposition: A | Discharge status: Home / Self Care | Payer: Medicare Other | Source: Ambulatory Visit | Attending: Radiation Oncology | Admitting: Radiation Oncology

## 2015-10-27 DIAGNOSIS — R590 Localized enlarged lymph nodes: Secondary | ICD-10-CM | POA: Diagnosis not present

## 2015-10-27 DIAGNOSIS — J90 Pleural effusion, not elsewhere classified: Secondary | ICD-10-CM | POA: Diagnosis not present

## 2015-10-27 DIAGNOSIS — I700 Atherosclerosis of aorta: Secondary | ICD-10-CM | POA: Insufficient documentation

## 2015-10-27 DIAGNOSIS — I2510 Atherosclerotic heart disease of native coronary artery without angina pectoris: Secondary | ICD-10-CM | POA: Diagnosis not present

## 2015-10-27 DIAGNOSIS — K229 Disease of esophagus, unspecified: Secondary | ICD-10-CM | POA: Insufficient documentation

## 2015-10-27 DIAGNOSIS — C3411 Malignant neoplasm of upper lobe, right bronchus or lung: Secondary | ICD-10-CM | POA: Insufficient documentation

## 2015-10-27 HISTORY — DX: Reserved for concepts with insufficient information to code with codable children: IMO0002

## 2015-10-27 HISTORY — DX: Reserved for inherently not codable concepts without codable children: IMO0001

## 2015-10-27 MED ORDER — IOPAMIDOL (ISOVUE-300) INJECTION 61%
100.0000 mL | Freq: Once | INTRAVENOUS | Status: AC | PRN
  Administered 2015-10-27: 100 mL via INTRAVENOUS

## 2015-10-30 ENCOUNTER — Encounter (HOSPITAL_COMMUNITY): Payer: Self-pay

## 2015-10-30 ENCOUNTER — Other Ambulatory Visit: Payer: Self-pay

## 2015-10-30 ENCOUNTER — Telehealth: Payer: Self-pay | Admitting: Oncology

## 2015-10-30 ENCOUNTER — Emergency Department (HOSPITAL_COMMUNITY): Payer: Medicare Other

## 2015-10-30 ENCOUNTER — Inpatient Hospital Stay
Admission: EM | Admit: 2015-10-30 | Discharge: 2015-11-01 | DRG: 309 | Disposition: A | Discharge status: Home / Self Care | Payer: Medicare Other | Attending: Family Medicine | Admitting: Family Medicine

## 2015-10-30 ENCOUNTER — Encounter: Payer: Self-pay | Admitting: Radiation Oncology

## 2015-10-30 ENCOUNTER — Ambulatory Visit
Admission: RE | Admit: 2015-10-30 | Discharge: 2015-10-30 | Disposition: A | Discharge status: Home / Self Care | Payer: Medicare Other | Source: Ambulatory Visit | Attending: Radiation Oncology | Admitting: Radiation Oncology

## 2015-10-30 VITALS — BP 118/56 | HR 154 | Temp 97.7°F | Ht 66.0 in | Wt 160.7 lb

## 2015-10-30 DIAGNOSIS — I255 Ischemic cardiomyopathy: Secondary | ICD-10-CM | POA: Diagnosis present

## 2015-10-30 DIAGNOSIS — I959 Hypotension, unspecified: Secondary | ICD-10-CM | POA: Diagnosis not present

## 2015-10-30 DIAGNOSIS — C3411 Malignant neoplasm of upper lobe, right bronchus or lung: Secondary | ICD-10-CM | POA: Diagnosis not present

## 2015-10-30 DIAGNOSIS — Z7951 Long term (current) use of inhaled steroids: Secondary | ICD-10-CM

## 2015-10-30 DIAGNOSIS — Z8673 Personal history of transient ischemic attack (TIA), and cerebral infarction without residual deficits: Secondary | ICD-10-CM

## 2015-10-30 DIAGNOSIS — I2510 Atherosclerotic heart disease of native coronary artery without angina pectoris: Secondary | ICD-10-CM | POA: Diagnosis not present

## 2015-10-30 DIAGNOSIS — Z823 Family history of stroke: Secondary | ICD-10-CM

## 2015-10-30 DIAGNOSIS — J449 Chronic obstructive pulmonary disease, unspecified: Secondary | ICD-10-CM | POA: Diagnosis not present

## 2015-10-30 DIAGNOSIS — F419 Anxiety disorder, unspecified: Secondary | ICD-10-CM | POA: Diagnosis not present

## 2015-10-30 DIAGNOSIS — I739 Peripheral vascular disease, unspecified: Secondary | ICD-10-CM | POA: Diagnosis present

## 2015-10-30 DIAGNOSIS — I714 Abdominal aortic aneurysm, without rupture: Secondary | ICD-10-CM | POA: Diagnosis present

## 2015-10-30 DIAGNOSIS — K219 Gastro-esophageal reflux disease without esophagitis: Secondary | ICD-10-CM | POA: Diagnosis present

## 2015-10-30 DIAGNOSIS — E7800 Pure hypercholesterolemia, unspecified: Secondary | ICD-10-CM | POA: Diagnosis present

## 2015-10-30 DIAGNOSIS — Z7982 Long term (current) use of aspirin: Secondary | ICD-10-CM

## 2015-10-30 DIAGNOSIS — I5022 Chronic systolic (congestive) heart failure: Secondary | ICD-10-CM | POA: Diagnosis not present

## 2015-10-30 DIAGNOSIS — I10 Essential (primary) hypertension: Secondary | ICD-10-CM | POA: Diagnosis present

## 2015-10-30 DIAGNOSIS — I4891 Unspecified atrial fibrillation: Secondary | ICD-10-CM | POA: Diagnosis present

## 2015-10-30 DIAGNOSIS — Z888 Allergy status to other drugs, medicaments and biological substances status: Secondary | ICD-10-CM

## 2015-10-30 DIAGNOSIS — Z9181 History of falling: Secondary | ICD-10-CM

## 2015-10-30 DIAGNOSIS — F17200 Nicotine dependence, unspecified, uncomplicated: Secondary | ICD-10-CM | POA: Diagnosis present

## 2015-10-30 DIAGNOSIS — Q211 Atrial septal defect: Secondary | ICD-10-CM

## 2015-10-30 DIAGNOSIS — I4892 Unspecified atrial flutter: Secondary | ICD-10-CM | POA: Diagnosis not present

## 2015-10-30 DIAGNOSIS — Z7902 Long term (current) use of antithrombotics/antiplatelets: Secondary | ICD-10-CM

## 2015-10-30 DIAGNOSIS — I110 Hypertensive heart disease with heart failure: Secondary | ICD-10-CM | POA: Diagnosis present

## 2015-10-30 DIAGNOSIS — Z8546 Personal history of malignant neoplasm of prostate: Secondary | ICD-10-CM

## 2015-10-30 DIAGNOSIS — R000 Tachycardia, unspecified: Secondary | ICD-10-CM | POA: Diagnosis not present

## 2015-10-30 DIAGNOSIS — I350 Nonrheumatic aortic (valve) stenosis: Secondary | ICD-10-CM | POA: Diagnosis not present

## 2015-10-30 DIAGNOSIS — Z955 Presence of coronary angioplasty implant and graft: Secondary | ICD-10-CM

## 2015-10-30 DIAGNOSIS — Z882 Allergy status to sulfonamides status: Secondary | ICD-10-CM

## 2015-10-30 DIAGNOSIS — F17210 Nicotine dependence, cigarettes, uncomplicated: Secondary | ICD-10-CM | POA: Diagnosis present

## 2015-10-30 DIAGNOSIS — Z881 Allergy status to other antibiotic agents status: Secondary | ICD-10-CM

## 2015-10-30 DIAGNOSIS — Z923 Personal history of irradiation: Secondary | ICD-10-CM

## 2015-10-30 DIAGNOSIS — I252 Old myocardial infarction: Secondary | ICD-10-CM

## 2015-10-30 DIAGNOSIS — Z8249 Family history of ischemic heart disease and other diseases of the circulatory system: Secondary | ICD-10-CM

## 2015-10-30 LAB — CBC WITH DIFFERENTIAL/PLATELET
BASOS ABS: 0 10*3/uL (ref 0.0–0.1)
Basophils Relative: 0 %
EOS PCT: 1 %
Eosinophils Absolute: 0.1 10*3/uL (ref 0.0–0.7)
HCT: 39.4 % (ref 39.0–52.0)
HEMOGLOBIN: 13.7 g/dL (ref 13.0–17.0)
LYMPHS ABS: 0.9 10*3/uL (ref 0.7–4.0)
LYMPHS PCT: 9 %
MCH: 32.5 pg (ref 26.0–34.0)
MCHC: 34.8 g/dL (ref 30.0–36.0)
MCV: 93.4 fL (ref 78.0–100.0)
Monocytes Absolute: 0.6 10*3/uL (ref 0.1–1.0)
Monocytes Relative: 6 %
NEUTROS PCT: 84 %
Neutro Abs: 8.1 10*3/uL — ABNORMAL HIGH (ref 1.7–7.7)
PLATELETS: 369 10*3/uL (ref 150–400)
RBC: 4.22 MIL/uL (ref 4.22–5.81)
RDW: 13.1 % (ref 11.5–15.5)
WBC: 9.6 10*3/uL (ref 4.0–10.5)

## 2015-10-30 LAB — I-STAT CHEM 8, ED
BUN: 13 mg/dL (ref 6–20)
CHLORIDE: 94 mmol/L — AB (ref 101–111)
Calcium, Ion: 1.09 mmol/L — ABNORMAL LOW (ref 1.12–1.23)
Creatinine, Ser: 1 mg/dL (ref 0.61–1.24)
GLUCOSE: 118 mg/dL — AB (ref 65–99)
HCT: 42 % (ref 39.0–52.0)
Hemoglobin: 14.3 g/dL (ref 13.0–17.0)
POTASSIUM: 3.8 mmol/L (ref 3.5–5.1)
SODIUM: 131 mmol/L — AB (ref 135–145)
TCO2: 27 mmol/L (ref 0–100)

## 2015-10-30 LAB — COMPREHENSIVE METABOLIC PANEL
ALT: 21 U/L (ref 17–63)
ANION GAP: 8 (ref 5–15)
AST: 19 U/L (ref 15–41)
Albumin: 3.6 g/dL (ref 3.5–5.0)
Alkaline Phosphatase: 80 U/L (ref 38–126)
BILIRUBIN TOTAL: 0.7 mg/dL (ref 0.3–1.2)
BUN: 13 mg/dL (ref 6–20)
CHLORIDE: 95 mmol/L — AB (ref 101–111)
CO2: 28 mmol/L (ref 22–32)
Calcium: 8.9 mg/dL (ref 8.9–10.3)
Creatinine, Ser: 1.02 mg/dL (ref 0.61–1.24)
Glucose, Bld: 121 mg/dL — ABNORMAL HIGH (ref 65–99)
POTASSIUM: 3.7 mmol/L (ref 3.5–5.1)
Sodium: 131 mmol/L — ABNORMAL LOW (ref 135–145)
TOTAL PROTEIN: 6.6 g/dL (ref 6.5–8.1)

## 2015-10-30 LAB — I-STAT TROPONIN, ED: TROPONIN I, POC: 0 ng/mL (ref 0.00–0.08)

## 2015-10-30 LAB — MRSA PCR SCREENING: MRSA BY PCR: NEGATIVE

## 2015-10-30 LAB — TSH: TSH: 5.114 u[IU]/mL — ABNORMAL HIGH (ref 0.350–4.500)

## 2015-10-30 MED ORDER — APIXABAN 5 MG PO TABS
5.0000 mg | ORAL_TABLET | Freq: Two times a day (BID) | ORAL | Status: DC
  Administered 2015-10-30 – 2015-11-01 (×5): 5 mg via ORAL
  Filled 2015-10-30 (×5): qty 1

## 2015-10-30 MED ORDER — METOPROLOL TARTRATE 25 MG PO TABS
25.0000 mg | ORAL_TABLET | Freq: Two times a day (BID) | ORAL | Status: DC
  Administered 2015-10-30 – 2015-10-31 (×2): 25 mg via ORAL
  Filled 2015-10-30 (×3): qty 1

## 2015-10-30 MED ORDER — CETYLPYRIDINIUM CHLORIDE 0.05 % MT LIQD
7.0000 mL | Freq: Two times a day (BID) | OROMUCOSAL | Status: DC
  Administered 2015-10-30: 7 mL via OROMUCOSAL

## 2015-10-30 MED ORDER — SODIUM CHLORIDE 0.9% FLUSH
3.0000 mL | Freq: Two times a day (BID) | INTRAVENOUS | Status: DC
  Administered 2015-10-30 – 2015-10-31 (×2): 3 mL via INTRAVENOUS

## 2015-10-30 MED ORDER — HYDROCODONE-ACETAMINOPHEN 5-325 MG PO TABS
1.0000 | ORAL_TABLET | ORAL | Status: DC | PRN
  Administered 2015-10-30: 2 via ORAL
  Administered 2015-10-31: 1 via ORAL
  Filled 2015-10-30: qty 1
  Filled 2015-10-30: qty 2

## 2015-10-30 MED ORDER — ADENOSINE 6 MG/2ML IV SOLN
INTRAVENOUS | Status: AC
  Filled 2015-10-30: qty 8

## 2015-10-30 MED ORDER — MOMETASONE FURO-FORMOTEROL FUM 200-5 MCG/ACT IN AERO
2.0000 | INHALATION_SPRAY | Freq: Two times a day (BID) | RESPIRATORY_TRACT | Status: DC
  Administered 2015-10-30 – 2015-11-01 (×4): 2 via RESPIRATORY_TRACT
  Filled 2015-10-30: qty 8.8

## 2015-10-30 MED ORDER — DILTIAZEM HCL 25 MG/5ML IV SOLN: 10.0000 mg | Freq: Once | INTRAVENOUS | Status: DC

## 2015-10-30 MED ORDER — ADULT MULTIVITAMIN W/MINERALS CH
1.0000 | ORAL_TABLET | Freq: Every day | ORAL | Status: DC
  Administered 2015-10-31 – 2015-11-01 (×2): 1 via ORAL
  Filled 2015-10-30 (×2): qty 1

## 2015-10-30 MED ORDER — PREGABALIN 25 MG PO CAPS
25.0000 mg | ORAL_CAPSULE | Freq: Two times a day (BID) | ORAL | Status: DC
  Administered 2015-10-30 – 2015-11-01 (×4): 25 mg via ORAL
  Filled 2015-10-30 (×4): qty 1

## 2015-10-30 MED ORDER — NICOTINE 7 MG/24HR TD PT24
7.0000 mg | MEDICATED_PATCH | Freq: Every day | TRANSDERMAL | Status: DC
  Administered 2015-10-30 – 2015-10-31 (×2): 7 mg via TRANSDERMAL
  Filled 2015-10-30 (×3): qty 1

## 2015-10-30 MED ORDER — ONDANSETRON HCL 4 MG PO TABS: 4.0000 mg | ORAL_TABLET | Freq: Four times a day (QID) | ORAL | Status: DC | PRN

## 2015-10-30 MED ORDER — ALPRAZOLAM 0.25 MG PO TABS
0.2500 mg | ORAL_TABLET | Freq: Every evening | ORAL | Status: DC | PRN
  Administered 2015-10-31: 0.25 mg via ORAL
  Filled 2015-10-30: qty 1

## 2015-10-30 MED ORDER — DILTIAZEM LOAD VIA INFUSION
10.0000 mg | Freq: Once | INTRAVENOUS | Status: AC
  Administered 2015-10-30: 10 mg via INTRAVENOUS
  Filled 2015-10-30: qty 10

## 2015-10-30 MED ORDER — ONDANSETRON HCL 4 MG/2ML IJ SOLN: 4.0000 mg | Freq: Four times a day (QID) | INTRAMUSCULAR | Status: DC | PRN

## 2015-10-30 MED ORDER — ADENOSINE 6 MG/2ML IV SOLN
6.0000 mg | Freq: Once | INTRAVENOUS | Status: AC
  Administered 2015-10-30: 6 mg via INTRAVENOUS
  Filled 2015-10-30: qty 2

## 2015-10-30 MED ORDER — NITROGLYCERIN 0.4 MG SL SUBL: 0.4000 mg | SUBLINGUAL_TABLET | SUBLINGUAL | Status: DC | PRN

## 2015-10-30 MED ORDER — SODIUM CHLORIDE 0.9 % IV SOLN
INTRAVENOUS | Status: AC
  Administered 2015-10-30 (×2): via INTRAVENOUS

## 2015-10-30 MED ORDER — ASPIRIN EC 81 MG PO TBEC
81.0000 mg | DELAYED_RELEASE_TABLET | Freq: Every day | ORAL | Status: DC
  Administered 2015-10-30 – 2015-10-31 (×2): 81 mg via ORAL
  Filled 2015-10-30 (×2): qty 1

## 2015-10-30 MED ORDER — LORATADINE 10 MG PO TABS
10.0000 mg | ORAL_TABLET | Freq: Every day | ORAL | Status: DC
  Administered 2015-10-30 – 2015-11-01 (×3): 10 mg via ORAL
  Filled 2015-10-30 (×3): qty 1

## 2015-10-30 MED ORDER — ACETAMINOPHEN 325 MG PO TABS
650.0000 mg | ORAL_TABLET | ORAL | Status: DC | PRN
  Administered 2015-10-30: 650 mg via ORAL
  Filled 2015-10-30: qty 2

## 2015-10-30 MED ORDER — SODIUM CHLORIDE 0.9 % IV BOLUS (SEPSIS)
1000.0000 mL | INTRAVENOUS | Status: DC | PRN
  Administered 2015-10-30 (×2): 1000 mL via INTRAVENOUS
  Filled 2015-10-30 (×2): qty 1000

## 2015-10-30 MED ORDER — CHLORHEXIDINE GLUCONATE 0.12 % MT SOLN
15.0000 mL | Freq: Two times a day (BID) | OROMUCOSAL | Status: DC
  Administered 2015-10-30 – 2015-11-01 (×4): 15 mL via OROMUCOSAL
  Filled 2015-10-30 (×3): qty 15

## 2015-10-30 MED ORDER — DILTIAZEM HCL-DEXTROSE 100-5 MG/100ML-% IV SOLN (PREMIX)
5.0000 mg/h | INTRAVENOUS | Status: DC
  Administered 2015-10-30: 10 mg/h via INTRAVENOUS
  Administered 2015-10-30: 7.5 mg/h via INTRAVENOUS
  Administered 2015-10-30: 5 mg/h via INTRAVENOUS
  Filled 2015-10-30 (×4): qty 100

## 2015-10-30 MED ORDER — ALBUTEROL SULFATE HFA 108 (90 BASE) MCG/ACT IN AERS: 2.0000 | INHALATION_SPRAY | RESPIRATORY_TRACT | Status: DC | PRN

## 2015-10-30 MED ORDER — TAMSULOSIN HCL 0.4 MG PO CAPS
0.4000 mg | ORAL_CAPSULE | Freq: Every day | ORAL | Status: DC
  Administered 2015-10-30 – 2015-10-31 (×2): 0.4 mg via ORAL
  Filled 2015-10-30 (×2): qty 1

## 2015-10-30 MED ORDER — IPRATROPIUM-ALBUTEROL 0.5-2.5 (3) MG/3ML IN SOLN: 3.0000 mL | RESPIRATORY_TRACT | Status: DC | PRN

## 2015-10-30 MED ORDER — TIOTROPIUM BROMIDE MONOHYDRATE 18 MCG IN CAPS
18.0000 ug | ORAL_CAPSULE | Freq: Every day | RESPIRATORY_TRACT | Status: DC
  Administered 2015-10-31 – 2015-11-01 (×2): 18 ug via RESPIRATORY_TRACT
  Filled 2015-10-30: qty 5

## 2015-10-30 NOTE — Progress Notes (Addendum)
ANTICOAGULATION CONSULT NOTE - Initial Consult  Pharmacy Consult for Eliquis Indication: atrial fibrillation  Allergies  Allergen Reactions  . Augmentin [Amoxicillin-Pot Clavulanate] Nausea And Vomiting    Diarrhea  . Sulfonamide Derivatives Other (See Comments)    Pt do not remember 8-30 md  . Zantac [Ranitidine Hcl] Diarrhea    Patient Measurements: Height: '5\' 9"'$  (175.3 cm) Weight: 159 lb 9.8 oz (72.4 kg) IBW/kg (Calculated) : 70.7  Vital Signs: Temp: 97.7 F (36.5 C) (08/10 1518) Temp Source: Oral (08/10 1518) BP: 99/74 (08/10 1600) Pulse Rate: 146 (08/10 1518)  Labs:  Recent Labs  10/30/15 1000 10/30/15 1014  HGB 13.7 14.3  HCT 39.4 42.0  PLT 369  --   CREATININE 1.02 1.00    Estimated Creatinine Clearance: 63.8 mL/min (by C-G formula based on SCr of 1 mg/dL).   Medical History: Past Medical History:  Diagnosis Date  . AAA (abdominal aortic aneurysm) (HCC)    4.2 cm IR AAA noted on 01/13/15 PET scan  . Anxiety   . CAD (coronary artery disease)    AMI 1997 w/ BMS x 2 LAD, BMS CFX 2002, DES x 2 LAD 2009, DES CFX & OM 2012   . Carotid artery disease ([place])    RICA CEA 1610, LICA CEA 9604 per Dr. [name],   . CHF (congestive heart failure) ([place])    pt denied in 2014  . COPD (chronic obstructive pulmonary disease) ([place])   . GERD (gastroesophageal reflux disease)    occ  . HTN (hypertension)   . Hydradenitis   . Hyperlipidemia   . Ischemic cardiomyopathy    EF 40-45 percent at cath 2012  . Myocardial infarction ([place])   . Non-small cell lung cancer (NSCLC) ([place]) dx'd 12/2014   SBRT  . Osteoarthritis    pt. denies 01/17/2015  . PAC (premature atrial contraction)   . Peripheral vascular disease ([place])   . Pneumonia   . Prostate cancer ([place]) dx'd approx 2008   xrt throughfoley  . Radiation 02/18/15-02/25/15   right upper lung 54 Gy  . Shortness of breath dyspnea   . Stroke [facility]) 11-24-11   "no feeling in right 5th digit"  . Urinary frequency   . Urinary  urgency     Medications:  Prescriptions Prior to Admission  Medication Sig Dispense Refill Last Dose  . acetaminophen (TYLENOL) 325 MG tablet Take 650 mg by mouth every 4 (four) hours as needed for mild pain, moderate pain, fever or headache.    10/30/2015 at 0630  . albuterol (PROVENTIL HFA;VENTOLIN HFA) 108 (90 Base) MCG/ACT inhaler Inhale 2 puffs into the lungs every 4 (four) hours as needed for wheezing or shortness of breath.   10/30/2015 at Unknown time  . ALPRAZolam (XANAX) 0.5 MG tablet Take 1 tablet (0.5 mg total) by mouth at bedtime as needed. Sleep or anxiety (Patient taking differently: Take 0.25 mg by mouth at bedtime as needed for sleep. Sleep or anxiety) 30 tablet 1 10/29/2015 at Unknown time  . aspirin EC 81 MG tablet Take 81 mg by mouth at bedtime.    10/29/2015 at 2130  . clopidogrel (PLAVIX) 75 MG tablet Take 1 tablet (75 mg total) by mouth daily. You may restart this medication on 12/27/14 (Patient taking differently: Take 75 mg by mouth at bedtime. )   10/29/2015 at Unknown time  . Fluticasone-Salmeterol (ADVAIR DISKUS) 500-50 MCG/DOSE AEPB Inhale 1 puff into the lungs 2 (two) times daily. 3 each 0 10/30/2015 at Unknown time  .  ipratropium-albuterol (DUONEB) 0.5-2.5 (3) MG/3ML SOLN INHALE CONTENTS OF 1 VIAL EVERY 4 HOURS AS NEEDED (Patient taking differently: Take 3 mLs by nebulization every 4 (four) hours as needed. For shortness of breath/wheezing) 180 mL 3 10/30/2015 at Unknown time  . loratadine (CLARITIN) 10 MG tablet Take 10 mg by mouth daily.   10/29/2015 at Unknown time  . metoprolol tartrate (LOPRESSOR) 25 MG tablet Take 12.5 mg by mouth 2 (two) times daily.   10/19/2015  . Multiple Vitamin (MULTIVITAMIN WITH MINERALS) TABS tablet Take 1 tablet by mouth daily.   Past Week at Unknown time  . nitroGLYCERIN (NITROSTAT) 0.4 MG SL tablet Place 1 tablet (0.4 mg total) under the tongue every 5 (five) minutes as needed. For chest pain (Patient taking differently: Place 0.4 mg under the tongue  every 5 (five) minutes as needed for chest pain. ) 25 tablet 6 unknown  . OxyCODONE HCl, Abuse Deter, (OXAYDO) 5 MG TABA Take 1 tablet by mouth 2 (two) times daily as needed (for pain).    10/30/2015 at 0300  . pregabalin (LYRICA) 25 MG capsule Take 1 capsule (25 mg total) by mouth 2 (two) times daily. 20 capsule 0 10/30/2015 at 0630  . Tamsulosin HCl (FLOMAX) 0.4 MG CAPS Take 0.4 mg by mouth at bedtime.    10/29/2015 at Unknown time  . tiotropium (SPIRIVA HANDIHALER) 18 MCG inhalation capsule INHALE 1 CAPSULE VIA HANDIHALER ONCE DAILY AT THE SAME TIME EVERY DAY (Patient taking differently: Place 18 mcg into inhaler and inhale daily. ) 30 capsule 5 10/30/2015 at 0630   Scheduled:  . adenosine      . apixaban  5 mg Oral BID  . aspirin EC  81 mg Oral QHS  . loratadine  10 mg Oral Daily  . metoprolol tartrate  25 mg Oral BID  . mometasone-formoterol  2 puff Inhalation BID  . [START ON 10/31/2015] multivitamin with minerals  1 tablet Oral Daily  . pregabalin  25 mg Oral BID  . sodium chloride flush  3 mL Intravenous Q12H  . tamsulosin  0.4 mg Oral QHS  . tiotropium  18 mcg Inhalation Daily    Assessment: 70 yoM with PMH CAD (s/p DES 2012) AAA, Lung Ca on chemo, COPD, HLD, prostate cancer, sent from [facility] appointment for tachycardia and found to have new atrial flutter with RVR. Currently on ASA/Plavix for CAD. CHADS2-VASc score 6, and Cardiology stopping Plavix and to start Eliquis per pharmacy   Prior anticoagulation: DAPT with ASA/Plavix only  Significant events:  Today, 10/30/2015:  CBC: wnl  SCr < 1.5; Age < 27; Wt > 60 kg  Major drug interactions: none, will continue ASA 81 mg per Cards  No bleeding issues per nursing  Goal of Therapy: Prevent Thromoboembolism  Plan:  Eliquis 5 mg PO bid  Recommend CBC at least q72 hr while on Eliquis  Monitor for signs of bleeding or thrombosis  Pharmacy to provide patient education prior to discharge   Reuel Boom, PharmD Pager:  [phone] 10/30/2015, 4:15 PM

## 2015-10-30 NOTE — Progress Notes (Signed)
Received pt from ED with Normal Saline Locked IV site. Restarted Cardizem as ordered at '10mg'$ /hr. Dr Candiss Norse paged for further orders.

## 2015-10-30 NOTE — ED Notes (Signed)
Bed: WA08 Expected date:  Expected time:  Means of arrival:  Comments: Cancer center afib

## 2015-10-30 NOTE — ED Triage Notes (Signed)
Patient was at the cancer center for a follow up when he was placed on the monitor and found to be in atrial fibrillation.  He recently fell on July 25 and sustained 5 rib fractures on the right and a pneumothorax.  Patient reports he is not sleeping well because of the pain.  Patient reports he is taking Plavix.

## 2015-10-30 NOTE — H&P (Signed)
TRH H&P   Patient Demographics:    [name], is a 75 y.o. male  MRN: [id number]   DOB - 1940-06-15  Admit Date - 10/30/2015  Outpatient Primary MD for the patient is Loura Pardon, MD  Outpatient Specialists: Dr. Sondra Come radiation oncologist, Dr. Lamonte Sakai pulmonary, Dr. Pauline Good cardiology    Patient coming from: From home  Chief Complaint  Patient presents with  . Atrial Fibrillation      HPI:    [name]  is a 75 y.o. 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 Sondra Come, CAD status post multiple stents placement last DES stents placed in 2012 by Dr. Edwyna Ready, dyslipidemia, prostate cancer status post radiation treatment initially had hematuria but now no issues with that.  Patient with above history went for a routine follow-up visit to radiation oncologist Dr. Dwana Curd, he was due for a repeat CT scan, in the office he was found to have a very fast heart rate and he was sent to the ER. In the ER patient was found to have new onset atrial flutter with RVR rate in 150s, he was started on Cardizem drip and I was called to admit the patient. Of note patient is completely symptom free and does not feel any palpitations or chest pain, no chest pressure or shortness of breath. He does say that he has history of a stroke which happened after left carotid endarterectomy which affected his right fifth upper digit, his review of systems currently is completely unremarkable.    Review of systems:    In addition to the HPI above,  No Fever-chills, No Headache, No changes with  Vision or hearing, No problems swallowing food or Liquids, No Chest pain, Cough or Shortness of Breath, No Abdominal pain, No Nausea or Vommitting, Bowel movements are regular, No Blood in stool or Urine, No dysuria, No new skin rashes or bruises, No new joints pains-aches,  No new weakness, tingling, numbness in any extremity, No recent weight gain or loss, No polyuria, polydypsia or polyphagia, No significant Mental Stressors.  A full 10 point Review of Systems was done, except as stated above, all other Review of Systems were negative.   With Past  History of the following :    Past Medical History:  Diagnosis Date  . AAA (abdominal aortic aneurysm) (HCC)    4.2 cm IR AAA noted on 01/13/15 PET scan  . Anxiety   . CAD (coronary artery disease)    AMI 1997 w/ BMS x 2 LAD, BMS CFX 2002, DES x 2 LAD 2009, DES CFX & OM 2012   . Carotid artery disease ([place])    RICA CEA 2426, LICA CEA 8341 per Dr. [name],   . CHF (congestive heart failure) ([place])    pt denied in 2014  . COPD (chronic obstructive pulmonary disease) ([place])   . GERD (gastroesophageal reflux disease)    occ  . HTN (hypertension)   . Hydradenitis   . Hyperlipidemia   . Ischemic cardiomyopathy    EF 40-45 percent at cath 2012  . Myocardial infarction ([place])   . Non-small cell lung cancer (NSCLC) ([place]) dx'd 12/2014   SBRT  . Osteoarthritis    pt. denies 01/17/2015  . PAC (premature atrial contraction)   . Peripheral vascular disease ([place])   . Pneumonia   . Prostate cancer ([place]) dx'd approx 2008   xrt throughfoley  . Radiation 02/18/15-02/25/15   right upper lung 54 Gy  . Shortness of breath dyspnea   . Stroke [facility]) 11-24-11   "no feeling in right 5th digit"  . Urinary frequency   . Urinary urgency       Past Surgical History:  Procedure Laterality Date  . CARDIAC CATHETERIZATION    . CARDIOVASCULAR STRESS TEST  12-2006  . CAROTID ENDARTERECTOMY Right 03-2000   CE  . CAROTID ENDARTERECTOMY Left 11-24-11   CE    . COLONOSCOPY  04-2001   polyp  . [place], 2002, 2009, 2012   8 stents total  . [place]   neg  . ENDARTERECTOMY  11/24/2011   Procedure: ENDARTERECTOMY CAROTID;  Surgeon: Rosetta Posner, MD;  Location: [place];  Service: Vascular;  Laterality: Left;  . EYE SURGERY Bilateral Aug. 17, 2015   Cataract  . FUDUCIAL PLACEMENT N/A 12/25/2014   Procedure: PLACEMENT OF FUDUCIAL;  Surgeon: Collene Gobble, MD;  Location: [place];  Service: Thoracic;  Laterality: N/A;  . PROSTATE SURGERY     Radiation Tx  X's 40  . VASCULAR SURGERY    . VIDEO BRONCHOSCOPY WITH ENDOBRONCHIAL NAVIGATION N/A 12/25/2014   Procedure: VIDEO BRONCHOSCOPY WITH ENDOBRONCHIAL NAVIGATION  with Biopsies and Brushings;  Surgeon: Collene Gobble, MD;  Location: [place];  Service: Thoracic;  Laterality: N/A;  . VIDEO BRONCHOSCOPY WITH ENDOBRONCHIAL ULTRASOUND N/A 01/22/2015   Procedure: VIDEO BRONCHOSCOPY WITH ENDOBRONCHIAL ULTRASOUND with Biopsies;  Surgeon: Collene Gobble, MD;  Location: [place] [facility];  Service: Thoracic;  Laterality: N/A;      Social History:     Social History  Substance Use Topics  . Smoking status: Current Every Day Smoker    Packs/day: 0.50    Years: 55.00    Types: Cigarettes  . Smokeless tobacco: Never Used     Comment: 1/2 ppd (01/07/15)  . Alcohol use 0.0 oz/week     Comment: occ         Family History :     Family History  Problem Relation Age of Onset  . Stroke Father   . Heart disease Father   . Heart disease Mother     pacemaker  . Other Brother     benign brain tumor  .  Cancer Brother     Eye-behind  . Cancer Sister     Breast  . Liver cancer Brother   . Diabetes Maternal Grandmother        Home Medications:   Prior to Admission medications   Medication Sig Start Date End Date Taking? Authorizing Provider  acetaminophen (TYLENOL) 325 MG tablet Take 650 mg by mouth every 4 (four) hours as needed for mild pain, moderate pain, fever or  headache.  10/17/15  Yes Historical Provider, MD  albuterol (PROVENTIL HFA;VENTOLIN HFA) 108 (90 Base) MCG/ACT inhaler Inhale 2 puffs into the lungs every 4 (four) hours as needed for wheezing or shortness of breath.   Yes Historical Provider, MD  ALPRAZolam Duanne Moron) 0.5 MG tablet Take 1 tablet (0.5 mg total) by mouth at bedtime as needed. Sleep or anxiety Patient taking differently: Take 0.25 mg by mouth at bedtime as needed for sleep. Sleep or anxiety 03/03/15  Yes Burnell Blanks, MD  aspirin EC 81 MG tablet Take 81 mg by mouth at bedtime.    Yes Historical Provider, MD  clopidogrel (PLAVIX) 75 MG tablet Take 1 tablet (75 mg total) by mouth daily. You may restart this medication on 12/27/14 Patient taking differently: Take 75 mg by mouth at bedtime.  01/24/15  Yes Collene Gobble, MD  Fluticasone-Salmeterol (ADVAIR DISKUS) 500-50 MCG/DOSE AEPB Inhale 1 puff into the lungs 2 (two) times daily. 06/28/14 04/27/16 Yes Tammy S Parrett, NP  ipratropium-albuterol (DUONEB) 0.5-2.5 (3) MG/3ML SOLN INHALE CONTENTS OF 1 VIAL EVERY 4 HOURS AS NEEDED Patient taking differently: Take 3 mLs by nebulization every 4 (four) hours as needed. For shortness of breath/wheezing 07/15/15  Yes Abner Greenspan, MD  loratadine (CLARITIN) 10 MG tablet Take 10 mg by mouth daily.   Yes Historical Provider, MD  metoprolol tartrate (LOPRESSOR) 25 MG tablet Take 12.5 mg by mouth 2 (two) times daily.   Yes Historical Provider, MD  Multiple Vitamin (MULTIVITAMIN WITH MINERALS) TABS tablet Take 1 tablet by mouth daily.   Yes Historical Provider, MD  nitroGLYCERIN (NITROSTAT) 0.4 MG SL tablet Place 1 tablet (0.4 mg total) under the tongue every 5 (five) minutes as needed. For chest pain Patient taking differently: Place 0.4 mg under the tongue every 5 (five) minutes as needed for chest pain.  03/03/15  Yes Burnell Blanks, MD  OxyCODONE HCl, Abuse Deter, (OXAYDO) 5 MG TABA Take 1 tablet by mouth 2 (two) times daily as needed (for  pain).    Yes Historical Provider, MD  pregabalin (LYRICA) 25 MG capsule Take 1 capsule (25 mg total) by mouth 2 (two) times daily. 10/24/15  Yes Pleas Koch, NP  Tamsulosin HCl (FLOMAX) 0.4 MG CAPS Take 0.4 mg by mouth at bedtime.    Yes Historical Provider, MD  tiotropium (SPIRIVA HANDIHALER) 18 MCG inhalation capsule INHALE 1 CAPSULE VIA HANDIHALER ONCE DAILY AT THE SAME TIME EVERY DAY Patient taking differently: Place 18 mcg into inhaler and inhale daily.  04/28/15  Yes Collene Gobble, MD     Allergies:     Allergies  Allergen Reactions  . Augmentin [Amoxicillin-Pot Clavulanate] Nausea And Vomiting    Diarrhea  . Sulfonamide Derivatives Other (See Comments)    Pt do not remember 8-30 md  . Zantac [Ranitidine Hcl] Diarrhea     Physical Exam:   Vitals  Blood pressure 104/79, pulse (!) 153, temperature 97.7 F (36.5 C), temperature source Oral, resp. rate 15, height '5\' 8"'$  (1.727 m), SpO2 100 %.  1. General Pleasant elderly white male lying in bed in NAD,     2. Normal affect and insight, Not Suicidal or Homicidal, Awake Alert, Oriented X 3.  3. No F.N deficits, ALL C.Nerves Intact, Strength 5/5 all 4 extremities, Sensation intact all 4 extremities, Plantars down going.  4. Ears and Eyes appear Normal, Conjunctivae clear, PERRLA. Moist Oral Mucosa.  5. Supple Neck, No JVD, No cervical lymphadenopathy appriciated, No Carotid Bruits.  6. Symmetrical Chest wall movement, Good air movement bilaterally, CTAB.  7. Rapid irregular heartbeat, No Gallops, Rubs or Murmurs, No Parasternal Heave.  8. Positive Bowel Sounds, Abdomen Soft, No tenderness, No organomegaly appriciated,No rebound -guarding or rigidity.  9.  No Cyanosis, Normal Skin Turgor, No Skin Rash or Bruise.  10. Good muscle tone,  joints appear normal , no effusions, Normal ROM.  11. No Palpable Lymph Nodes in Neck or Axillae      Data Review:    CBC  Recent Labs Lab 10/30/15 1000 10/30/15 1014  WBC  9.6  --   HGB 13.7 14.3  HCT 39.4 42.0  PLT 369  --   MCV 93.4  --   MCH 32.5  --   MCHC 34.8  --   RDW 13.1  --   LYMPHSABS 0.9  --   MONOABS 0.6  --   EOSABS 0.1  --   BASOSABS 0.0  --    ------------------------------------------------------------------------------------------------------------------  Chemistries   Recent Labs Lab 10/30/15 1000 10/30/15 1014  NA 131* 131*  K 3.7 3.8  CL 95* 94*  CO2 28  --   GLUCOSE 121* 118*  BUN 13 13  CREATININE 1.02 1.00  CALCIUM 8.9  --   AST 19  --   ALT 21  --   ALKPHOS 80  --   BILITOT 0.7  --    ------------------------------------------------------------------------------------------------------------------ estimated creatinine clearance is 61.8 mL/min (by C-G formula based on SCr of 1 mg/dL). ------------------------------------------------------------------------------------------------------------------ No results for input(s): TSH, T4TOTAL, T3FREE, THYROIDAB in the last 72 hours.  Invalid input(s): FREET3  Coagulation profile No results for input(s): INR, PROTIME in the last 168 hours. ------------------------------------------------------------------------------------------------------------------- No results for input(s): DDIMER in the last 72 hours. -------------------------------------------------------------------------------------------------------------------  Cardiac Enzymes No results for input(s): CKMB, TROPONINI, MYOGLOBIN in the last 168 hours.  Invalid input(s): CK ------------------------------------------------------------------------------------------------------------------ No results found for: BNP   ---------------------------------------------------------------------------------------------------------------  Urinalysis    Component Value Date/Time   COLORURINE YELLOW (A) 10/14/2015 0909   APPEARANCEUR CLEAR (A) 10/14/2015 0909   LABSPEC 1.018 10/14/2015 0909   LABSPEC 1.010  01/24/2009 1143   PHURINE 5.0 10/14/2015 0909   GLUCOSEU NEGATIVE 10/14/2015 0909   HGBUR NEGATIVE 10/14/2015 0909   BILIRUBINUR NEGATIVE 10/14/2015 0909   BILIRUBINUR neg 07/07/2015 1527   BILIRUBINUR Negative 01/24/2009 1143   KETONESUR TRACE (A) 10/14/2015 0909   PROTEINUR NEGATIVE 10/14/2015 0909   UROBILINOGEN 1.0 07/07/2015 1527   UROBILINOGEN 0.2 11/17/2011 0822   NITRITE NEGATIVE 10/14/2015 0909   LEUKOCYTESUR NEGATIVE 10/14/2015 0909   LEUKOCYTESUR Small 01/24/2009 1143    ----------------------------------------------------------------------------------------------------------------   Imaging Results:    Dg Chest Port 1 View  Result Date: 10/30/2015 CLINICAL DATA:  Tachycardia. Atrial fibrillation. History of non small cell lung cancer. EXAM: PORTABLE CHEST 1 VIEW COMPARISON:  Chest x-ray dated 10/14/2015 and chest CT dated 10/27/2015 FINDINGS: Heart size is increased since the prior chest x-ray. Pulmonary vascularity is normal. Persistent small right pleural effusion. Healing and old right rib fractures. Surgical clips and scarring in the right  upper lobe are less apparent. Small pulmonary nodules demonstrated on the prior CT scan are not apparent on this chest x-ray. IMPRESSION: Slight increase in cardiac size since the prior chest x-ray. Persistent small right pleural effusion. Electronically Signed   By: Lorriane Shire M.D.   On: 10/30/2015 10:24    My personal review of EKG: Rhythm A flutter rate in mid 150s , no Acute ST changes   Assessment & Plan:     1. New onset a flutter with RVR [place] vasc 2 score of at least 5. Previous history of stroke related to left carotid endarterectomy. Patient will be admitted to stepdown unit monitored on telemetry, IV Cardizem bolus and drip, continue home dose Lopressor, check TSH and echocardiogram. Cardiology has been consulted will see the patient shortly. Patient is symptom-free this was an incidental finding. Pharmacy to dose  Eliquis.  2. CAD with multiple stents in the past, last DS stents placed in 2012. We'll continue aspirin, stop Plavix, Eliquis as above, continue statin and beta blocker for secondary prevention. No acute issues. Charge follow with Dr. Pauline Good his cardiologist.  3. COPD. At baseline. Supportive care, no oxygen need, nebulizer treatments as needed. Postdischarge follow with Dr. Lamonte Sakai.  4. Right upper lobe T1N0 NSCLC of the right upper lobe - undergoing radiation treatments by Dr. Sondra Come, per patient cancer in good control continue outpatient follow-up as needed.  5. Dyslipidemia. Continue statin.  6. History of prostate cancer. In remission follow with his urologist postdischarge, initially had issues with hematuria but now hematuria free for several years. Continue Flomax.  7. Essential hypertension. On beta blocker continue, for now on Cardizem as well. If blood pressure drops amiodarone or digoxin can be considered.   DVT Prophylaxis Eliquis  AM Labs Ordered, also please review Full Orders  Family Communication: Admission, patients condition and plan of care including tests being ordered have been discussed with the patient and son who indicate understanding and agree with the plan and Code Status.  Code Status Full  Likely DC to  Home 2-3 days  Condition GUARDED    Consults called: Cards    Admission status: Inpt    Time spent in minutes : 35   SINGH,PRASHANT K M.D on 10/30/2015 at 1:06 PM  Between 7am to 7pm - Pager - [phone]. After 7pm go to [email or web address] - password [facility]  [facility] - Office  [phone]

## 2015-10-30 NOTE — Progress Notes (Signed)
Summary Note; [name] is alert,asymptomatic with his labile heart rate (AFlutter). BP is also very labile. Notified Dr. Glenford Peers, [place]. He has received a total of 2 liter NS bolus, and IV currently at 53m/hr(NS). I reduced the Cardizem infusion to 7.'5mg'$ /hr due to hypotension; will closely observe. Plan is for TEE in am. NPO after MN.

## 2015-10-30 NOTE — Telephone Encounter (Signed)
Called Dr. Reatha Armour office regarding patient's high heart rate to see if he needs to be seen or restart metoprolol.  Per Dr. Theodosia Blender nurse, patient needs to go to the ER.  Called the ER and spoke to the charge nurse.  Patient was placed on 2L of oxygen via nasal canula and was transported via wheelchair to ER room 8 with his son and grandson.  Gave report to [place], Therapist, sports.

## 2015-10-30 NOTE — ED Provider Notes (Signed)
[facility] DEPT Provider Note   CSN: [id number] Arrival date & time: 10/30/15  [id number]  First Provider Contact:  First MD Initiated Contact with Patient 10/30/15 [phone]        History   Chief Complaint Chief Complaint  Patient presents with  . Atrial Fibrillation    HPI [name] is a 75 y.o. male.  The history is provided by the patient. No language interpreter was used.  Atrial Fibrillation    [name] is a 75 y.o. male who presents to the Emergency Department complaining of rapid heart rate.  He was referred by his radiation oncologist after being evaluated in the clinic today and noted to have tachycardia. He denies any current complaints. He has a history of lung cancer and has completed radiation to his chest and had a follow-up chest CT yesterday. Today he met with his oncologist to review the results and was noted to be tachycardic and referred to the emergency department. On July 28 he had a fall and sustained multiple rib fractures and a pneumothorax and was admitted to the hospital at [facility] for treatment. He reports chronic cough and shortness of breath, unchanged from baseline. He denies any fever, chest pain, leg swelling or pain. He does endorse decreased oral intake.  Past Medical History:  Diagnosis Date  . AAA (abdominal aortic aneurysm) (HCC)    4.2 cm IR AAA noted on 01/13/15 PET scan  . Anxiety   . CAD (coronary artery disease)    AMI 1997 w/ BMS x 2 LAD, BMS CFX 2002, DES x 2 LAD 2009, DES CFX & OM 2012   . Carotid artery disease ([place])    RICA CEA 2563, LICA CEA 8937 per Dr. [name],   . CHF (congestive heart failure) ([place])    pt denied in 2014  . COPD (chronic obstructive pulmonary disease) ([place])   . GERD (gastroesophageal reflux disease)    occ  . HTN (hypertension)   . Hydradenitis   . Hyperlipidemia   . Ischemic cardiomyopathy    EF 40-45 percent at cath 2012  . Myocardial infarction ([place])   . Non-small cell lung cancer (NSCLC) ([place]) dx'd  12/2014   SBRT  . Osteoarthritis    pt. denies 01/17/2015  . PAC (premature atrial contraction)   . Peripheral vascular disease ([place])   . Pneumonia   . Prostate cancer ([place]) dx'd approx 2008   xrt throughfoley  . Radiation 02/18/15-02/25/15   right upper lung 54 Gy  . Shortness of breath dyspnea   . Stroke [facility]) 11-24-11   "no feeling in right 5th digit"  . Urinary frequency   . Urinary urgency     Patient Active Problem List   Diagnosis Date Noted  . Atrial flutter with rapid ventricular response ([place]) 10/30/2015  . Atrial flutter ([place]) 10/30/2015  . B12 deficiency 07/22/2015  . UTI (urinary tract infection) 07/15/2015  . Anemia 07/15/2015  . Hematuria 07/08/2015  . Unintentional weight loss 07/08/2015  . Orthostatic hypotension 07/08/2015  . 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 Cough 05/22/2015  . Tobacco user 05/22/2015  . Mediastinal lymphadenopathy 01/22/2015  . 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 S/P bronchoscopy with biopsy   . Colon cancer screening 11/06/2014  . Encounter for Medicare annual wellness exam 11/06/2014  . Routine general medical examination at a health care facility 11/06/2014  . Acute bronchitis 10/17/2014  . Lung nodule 06/28/2014  . Carotid stenosis 12/20/2013  . Aftercare  following surgery of the circulatory system 12/20/2013  . Aftercare following surgery of the circulatory system, [place] 12/19/2012  . Personal history of colonic polyps 08/28/2012  . Hyperglycemia 08/07/2012  . Boils 04/03/2012  . Intertrigo 04/03/2012  . Pre-operative cardiovascular examination 11/03/2011  . Carotid artery disease ([place]) 05/20/2011  . Grief reaction 05/07/2011  . Ischemic cardiomyopathy 01/13/2011  . Palpitations 11/10/2010  . Occlusion and stenosis of carotid artery without mention of cerebral infarction 04/02/2010  . Anxiety disorder 12/12/2009  . HYPERTENSION, BENIGN 09/29/2008  . History of prostate cancer 09/19/2008  . Hyperlipidemia  02/10/2007  . AMAUROSIS FUGAX 02/10/2007  . MYOCARDIAL INFARCTION, HX OF 02/10/2007  . Coronary atherosclerosis 02/10/2007  . PERIPHERAL VASCULAR DISEASE 02/10/2007  . HEMORRHOIDS 02/10/2007  . Chronic obstructive pulmonary disease ([place]) 02/10/2007  . OSTEOARTHRITIS 02/10/2007  . Nicotine dependence 02/10/2007  . Pure hypercholesterolemia 01/27/2007  . CORONARY ATHEROSCLEROSIS NATIVE CORONARY ARTERY 01/27/2007    Past Surgical History:  Procedure Laterality Date  . CARDIAC CATHETERIZATION    . CARDIOVASCULAR STRESS TEST  12-2006  . CAROTID ENDARTERECTOMY Right 03-2000   CE  . CAROTID ENDARTERECTOMY Left 11-24-11   CE  . COLONOSCOPY  04-2001   polyp  . [place], 2002, 2009, 2012   8 stents total  . [place]   neg  . ENDARTERECTOMY  11/24/2011   Procedure: ENDARTERECTOMY CAROTID;  Surgeon: Rosetta Posner, MD;  Location: [facility];  Service: Vascular;  Laterality: Left;  . EYE SURGERY Bilateral Aug. 17, 2015   Cataract  . FUDUCIAL PLACEMENT N/A 12/25/2014   Procedure: PLACEMENT OF FUDUCIAL;  Surgeon: Collene Gobble, MD;  Location: [facility];  Service: Thoracic;  Laterality: N/A;  . PROSTATE SURGERY     Radiation Tx  X's 40  . VASCULAR SURGERY    . VIDEO BRONCHOSCOPY WITH ENDOBRONCHIAL NAVIGATION N/A 12/25/2014   Procedure: VIDEO BRONCHOSCOPY WITH ENDOBRONCHIAL NAVIGATION  with Biopsies and Brushings;  Surgeon: Collene Gobble, MD;  Location: [facility];  Service: Thoracic;  Laterality: N/A;  . VIDEO BRONCHOSCOPY WITH ENDOBRONCHIAL ULTRASOUND N/A 01/22/2015   Procedure: VIDEO BRONCHOSCOPY WITH ENDOBRONCHIAL ULTRASOUND with Biopsies;  Surgeon: Collene Gobble, MD;  Location: [facility];  Service: Thoracic;  Laterality: N/A;       Home Medications    Prior to Admission medications   Medication Sig Start Date End Date Taking? Authorizing Provider  acetaminophen (TYLENOL) 325 MG tablet Take 650 mg by mouth every 4 (four) hours as needed for mild pain, moderate  pain, fever or headache.  10/17/15  Yes Historical Provider, MD  albuterol (PROVENTIL HFA;VENTOLIN HFA) 108 (90 Base) MCG/ACT inhaler Inhale 2 puffs into the lungs every 4 (four) hours as needed for wheezing or shortness of breath.   Yes Historical Provider, MD  ALPRAZolam Duanne Moron) 0.5 MG tablet Take 1 tablet (0.5 mg total) by mouth at bedtime as needed. Sleep or anxiety Patient taking differently: Take 0.25 mg by mouth at bedtime as needed for sleep. Sleep or anxiety 03/03/15  Yes Burnell Blanks, MD  aspirin EC 81 MG tablet Take 81 mg by mouth at bedtime.    Yes Historical Provider, MD  clopidogrel (PLAVIX) 75 MG tablet Take 1 tablet (75 mg total) by mouth daily. You may restart this medication on 12/27/14 Patient taking differently: Take 75 mg by mouth at bedtime.  01/24/15  Yes Collene Gobble, MD  Fluticasone-Salmeterol (ADVAIR DISKUS) 500-50 MCG/DOSE AEPB Inhale 1 puff into the lungs 2 (  two) times daily. 06/28/14 04/27/16 Yes Tammy S Parrett, NP  ipratropium-albuterol (DUONEB) 0.5-2.5 (3) MG/3ML SOLN INHALE CONTENTS OF 1 VIAL EVERY 4 HOURS AS NEEDED Patient taking differently: Take 3 mLs by nebulization every 4 (four) hours as needed. For shortness of breath/wheezing 07/15/15  Yes Abner Greenspan, MD  loratadine (CLARITIN) 10 MG tablet Take 10 mg by mouth daily.   Yes Historical Provider, MD  metoprolol tartrate (LOPRESSOR) 25 MG tablet Take 12.5 mg by mouth 2 (two) times daily.   Yes Historical Provider, MD  Multiple Vitamin (MULTIVITAMIN WITH MINERALS) TABS tablet Take 1 tablet by mouth daily.   Yes Historical Provider, MD  nitroGLYCERIN (NITROSTAT) 0.4 MG SL tablet Place 1 tablet (0.4 mg total) under the tongue every 5 (five) minutes as needed. For chest pain Patient taking differently: Place 0.4 mg under the tongue every 5 (five) minutes as needed for chest pain.  03/03/15  Yes Burnell Blanks, MD  OxyCODONE HCl, Abuse Deter, (OXAYDO) 5 MG TABA Take 1 tablet by mouth 2 (two) times daily as  needed (for pain).    Yes Historical Provider, MD  pregabalin (LYRICA) 25 MG capsule Take 1 capsule (25 mg total) by mouth 2 (two) times daily. 10/24/15  Yes Pleas Koch, NP  Tamsulosin HCl (FLOMAX) 0.4 MG CAPS Take 0.4 mg by mouth at bedtime.    Yes Historical Provider, MD  tiotropium (SPIRIVA HANDIHALER) 18 MCG inhalation capsule INHALE 1 CAPSULE VIA HANDIHALER ONCE DAILY AT THE SAME TIME EVERY DAY Patient taking differently: Place 18 mcg into inhaler and inhale daily.  04/28/15  Yes Collene Gobble, MD    Family History Family History  Problem Relation Age of Onset  . Stroke Father   . Heart disease Father   . Heart disease Mother     pacemaker  . Other Brother     benign brain tumor  . Cancer Brother     Eye-behind  . Cancer Sister     Breast  . Liver cancer Brother   . Diabetes Maternal Grandmother     Social History Social History  Substance Use Topics  . Smoking status: Current Every Day Smoker    Packs/day: 0.50    Years: 55.00    Types: Cigarettes  . Smokeless tobacco: Never Used     Comment: 1/2 ppd (01/07/15)  . Alcohol use 0.0 oz/week     Comment: occ     Allergies   Augmentin [amoxicillin-pot clavulanate]; Sulfonamide derivatives; and Zantac [ranitidine hcl]   Review of Systems Review of Systems  All other systems reviewed and are negative.    Physical Exam Updated Vital Signs BP (!) 89/74 (BP Location: Left Arm)   Pulse (!) 146   Temp 97.7 F (36.5 C) (Oral)   Resp (!) 22   Ht 5' 9"  (1.753 m)   Wt 159 lb 9.8 oz (72.4 kg)   SpO2 100%   BMI 23.57 kg/m   Physical Exam  Constitutional: He is oriented to person, place, and time. He appears well-developed and well-nourished.  HENT:  Head: Normocephalic and atraumatic.  Cardiovascular: Regular rhythm.   No murmur heard. Tachycardic  Pulmonary/Chest: Effort normal. No respiratory distress.  Rhonchi bilaterally  Abdominal: Soft. There is no tenderness. There is no rebound and no guarding.    Musculoskeletal: He exhibits no edema or tenderness.  Neurological: He is alert and oriented to person, place, and time.  Skin: Skin is warm and dry.  Psychiatric: He has a normal mood  and affect. His behavior is normal.  Nursing note and vitals reviewed.    ED Treatments / Results  Labs (all labs ordered are listed, but only abnormal results are displayed) Labs Reviewed  COMPREHENSIVE METABOLIC PANEL - Abnormal; Notable for the following:       Result Value   Sodium 131 (*)    Chloride 95 (*)    Glucose, Bld 121 (*)    All other components within normal limits  CBC WITH DIFFERENTIAL/PLATELET - Abnormal; Notable for the following:    Neutro Abs 8.1 (*)    All other components within normal limits  I-STAT CHEM 8, ED - Abnormal; Notable for the following:    Sodium 131 (*)    Chloride 94 (*)    Glucose, Bld 118 (*)    Calcium, Ion 1.09 (*)    All other components within normal limits  MRSA PCR SCREENING  TSH  I-STAT TROPOININ, ED    EKG  EKG Interpretation  Date/Time:  Thursday October 30 2015 09:22:49 EDT Ventricular Rate:  154 PR Interval:    QRS Duration: 119 QT Interval:  291 QTC Calculation: 466 R Axis:   -39 Text Interpretation:  Supraventricular tachycardia vs atrial flutter Nonspecific IVCD with LAD Anterior infarct, old Nonspecific T abnormalities, inferior leads Confirmed by Hazle Coca [phone]) on 10/30/2015 10:48:35 AM       Radiology Dg Chest Port 1 View  Result Date: 10/30/2015 CLINICAL DATA:  Tachycardia. Atrial fibrillation. History of non small cell lung cancer. EXAM: PORTABLE CHEST 1 VIEW COMPARISON:  Chest x-ray dated 10/14/2015 and chest CT dated 10/27/2015 FINDINGS: Heart size is increased since the prior chest x-ray. Pulmonary vascularity is normal. Persistent small right pleural effusion. Healing and old right rib fractures. Surgical clips and scarring in the right upper lobe are less apparent. Small pulmonary nodules demonstrated on the prior CT  scan are not apparent on this chest x-ray. IMPRESSION: Slight increase in cardiac size since the prior chest x-ray. Persistent small right pleural effusion. Electronically Signed   By: Lorriane Shire M.D.   On: 10/30/2015 10:24    Procedures Procedures (including critical care time)  Medications Ordered in ED Medications  adenosine (ADENOCARD) 6 MG/2ML injection (not administered)  diltiazem (CARDIZEM) 1 mg/mL load via infusion 10 mg (10 mg Intravenous Bolus from Bag 10/30/15 1151)    And  diltiazem (CARDIZEM) 100 mg in dextrose 5% 180m (1 mg/mL) infusion (10 mg/hr Intravenous New Bag/Given 10/30/15 1511)  tamsulosin (FLOMAX) capsule 0.4 mg (not administered)  mometasone-formoterol (DULERA) 200-5 MCG/ACT inhaler 2 puff (2 puffs Inhalation Not Given 10/30/15 1342)  aspirin EC tablet 81 mg (not administered)  ALPRAZolam (XANAX) tablet 0.25 mg (not administered)  nitroGLYCERIN (NITROSTAT) SL tablet 0.4 mg (not administered)  tiotropium (SPIRIVA) inhalation capsule 18 mcg (18 mcg Inhalation Not Given 10/30/15 1343)  ipratropium-albuterol (DUONEB) 0.5-2.5 (3) MG/3ML nebulizer solution 3 mL (not administered)  loratadine (CLARITIN) tablet 10 mg (not administered)  metoprolol tartrate (LOPRESSOR) tablet 25 mg (25 mg Oral Given 10/30/15 1518)  pregabalin (LYRICA) capsule 25 mg (not administered)  acetaminophen (TYLENOL) tablet 650 mg (not administered)  multivitamin with minerals tablet 1 tablet (not administered)  HYDROcodone-acetaminophen (NORCO/VICODIN) 5-325 MG per tablet 1-2 tablet (not administered)  ondansetron (ZOFRAN) tablet 4 mg (not administered)    Or  ondansetron (ZOFRAN) injection 4 mg (not administered)  sodium chloride flush (NS) 0.9 % injection 3 mL (3 mLs Intravenous Not Given 10/30/15 1520)  apixaban (ELIQUIS) tablet 5 mg (not administered)  sodium chloride 0.9 % bolus 1,000 mL (1,000 mLs Intravenous Given 10/30/15 1552)  adenosine (ADENOCARD) 6 MG/2ML injection 6 mg (6 mg  Intravenous Given 10/30/15 1045)  diltiazem (CARDIZEM) 1 mg/mL load via infusion 10 mg (10 mg Intravenous Bolus from Bag 10/30/15 1335)     Initial Impression / Assessment and Plan / ED Course  I have reviewed the triage vital signs and the nursing notes.  Pertinent labs & imaging results that were available during my care of the patient were reviewed by me and considered in my medical decision making (see chart for details).  Clinical Course    Patient here for evaluation of tachycardia. He is tachycardic but asymptomatic on ED arrival. Initial EKG with SVT versus atrial flutter. He was given adenosine, 6 mg once and noted to have flutter waves on cardiac monitoring. Will treat for new onset atrial flutter with diltiazem. Given the unknown duration of his flutter will admit for further treatment.  Hospitalist consulted for admission.    Final Clinical Impressions(s) / ED Diagnoses   Final diagnoses:  Atrial flutter with rapid ventricular response [facility])    New Prescriptions Current Discharge Medication List       Quintella Reichert, MD 10/30/15 [phone]

## 2015-10-30 NOTE — Consult Note (Addendum)
CARDIOLOGY CONSULT NOTE      Patient ID: [name] MRN: [id number] DOB/AGE: May 17, 1940 75 y.o.  Admit date: 10/30/2015 Referring PhysicianPrashant Jennette Kettle, MD Primary Exie Parody, MD Primary CardiologistDr. [name] Reason for Consultation atrial flutter  HPI: 75 year old man with a history of coronary artery disease and abdominal aortic aneurysm. His last coronary intervention was in 2012 at which time he had a drug-eluting stent placed in his circumflex. From a cardiac standpoint, he had been doing well.  He went to a routine appointment today for follow-up of his lung cancer. He was found to be in atrial flutter with rapid ventricular response. He is admitted for further evaluation. He denies any palpitations, chest pain or shortness of breath. He felt no problems and is surprised that he is out of rhythm. He denies any bleeding problems. He has maintained dual antiplatelet therapy since his last intervention. He continues to take his aspirin and Plavix daily.  Review of systems complete and found to be negative unless listed above   Past Medical History:  Diagnosis Date  . AAA (abdominal aortic aneurysm) (HCC)    4.2 cm IR AAA noted on 01/13/15 PET scan  . Anxiety   . CAD (coronary artery disease)    AMI 1997 w/ BMS x 2 LAD, BMS CFX 2002, DES x 2 LAD 2009, DES CFX & OM 2012   . Carotid artery disease ([place])    RICA CEA 7322, LICA CEA 0254 per Dr. [name],   . CHF (congestive heart failure) ([place])    pt denied in 2014  . COPD (chronic obstructive pulmonary disease) ([place])   . GERD (gastroesophageal reflux disease)    occ  . HTN (hypertension)   . Hydradenitis   . Hyperlipidemia   . Ischemic cardiomyopathy    EF 40-45 percent at cath 2012  . Myocardial infarction ([place])   . Non-small cell lung cancer (NSCLC) ([place]) dx'd 12/2014   SBRT  . Osteoarthritis    pt. denies 01/17/2015  . PAC (premature atrial contraction)   . Peripheral vascular disease ([place])   .  Pneumonia   . Prostate cancer ([place]) dx'd approx 2008   xrt throughfoley  . Radiation 02/18/15-02/25/15   right upper lung 54 Gy  . Shortness of breath dyspnea   . Stroke [facility]) 11-24-11   "no feeling in right 5th digit"  . Urinary frequency   . Urinary urgency     Family History  Problem Relation Age of Onset  . Stroke Father   . Heart disease Father   . Heart disease Mother     pacemaker  . Other Brother     benign brain tumor  . Cancer Brother     Eye-behind  . Cancer Sister     Breast  . Liver cancer Brother   . Diabetes Maternal Grandmother     Social History   Social History  . Marital status: Widowed    Spouse name: N/A  . Number of children: 5  . Years of education: N/A   Occupational History  . Retired Airline pilot   . Part time, delivering [employer]   Social History Main Topics  . Smoking status: Current Every Day Smoker    Packs/day: 0.50    Years: 55.00    Types: Cigarettes  . Smokeless tobacco: Never Used     Comment: 1/2 ppd (01/07/15)  . Alcohol use 0.0 oz/week     Comment: occ  . Drug use: No  . Sexual  activity: Not on file   Other Topics Concern  . Not on file   Social History Narrative  . No narrative on file    Past Surgical History:  Procedure Laterality Date  . CARDIAC CATHETERIZATION    . CARDIOVASCULAR STRESS TEST  12-2006  . CAROTID ENDARTERECTOMY Right 03-2000   CE  . CAROTID ENDARTERECTOMY Left 11-24-11   CE  . COLONOSCOPY  04-2001   polyp  . [place], 2002, 2009, 2012   8 stents total  . [place]   neg  . ENDARTERECTOMY  11/24/2011   Procedure: ENDARTERECTOMY CAROTID;  Surgeon: Rosetta Posner, MD;  Location: [place];  Service: Vascular;  Laterality: Left;  . EYE SURGERY Bilateral Aug. 17, 2015   Cataract  . FUDUCIAL PLACEMENT N/A 12/25/2014   Procedure: PLACEMENT OF FUDUCIAL;  Surgeon: Collene Gobble, MD;  Location: [place];  Service: Thoracic;  Laterality: N/A;  . PROSTATE  SURGERY     Radiation Tx  X's 40  . VASCULAR SURGERY    . VIDEO BRONCHOSCOPY WITH ENDOBRONCHIAL NAVIGATION N/A 12/25/2014   Procedure: VIDEO BRONCHOSCOPY WITH ENDOBRONCHIAL NAVIGATION  with Biopsies and Brushings;  Surgeon: Collene Gobble, MD;  Location: [place];  Service: Thoracic;  Laterality: N/A;  . VIDEO BRONCHOSCOPY WITH ENDOBRONCHIAL ULTRASOUND N/A 01/22/2015   Procedure: VIDEO BRONCHOSCOPY WITH ENDOBRONCHIAL ULTRASOUND with Biopsies;  Surgeon: Collene Gobble, MD;  Location: [place];  Service: Thoracic;  Laterality: N/A;     Prescriptions Prior to Admission  Medication Sig Dispense Refill Last Dose  . acetaminophen (TYLENOL) 325 MG tablet Take 650 mg by mouth every 4 (four) hours as needed for mild pain, moderate pain, fever or headache.    10/30/2015 at 0630  . albuterol (PROVENTIL HFA;VENTOLIN HFA) 108 (90 Base) MCG/ACT inhaler Inhale 2 puffs into the lungs every 4 (four) hours as needed for wheezing or shortness of breath.   10/30/2015 at Unknown time  . ALPRAZolam (XANAX) 0.5 MG tablet Take 1 tablet (0.5 mg total) by mouth at bedtime as needed. Sleep or anxiety (Patient taking differently: Take 0.25 mg by mouth at bedtime as needed for sleep. Sleep or anxiety) 30 tablet 1 10/29/2015 at Unknown time  . aspirin EC 81 MG tablet Take 81 mg by mouth at bedtime.    10/29/2015 at 2130  . clopidogrel (PLAVIX) 75 MG tablet Take 1 tablet (75 mg total) by mouth daily. You may restart this medication on 12/27/14 (Patient taking differently: Take 75 mg by mouth at bedtime. )   10/29/2015 at Unknown time  . Fluticasone-Salmeterol (ADVAIR DISKUS) 500-50 MCG/DOSE AEPB Inhale 1 puff into the lungs 2 (two) times daily. 3 each 0 10/30/2015 at Unknown time  . ipratropium-albuterol (DUONEB) 0.5-2.5 (3) MG/3ML SOLN INHALE CONTENTS OF 1 VIAL EVERY 4 HOURS AS NEEDED (Patient taking differently: Take 3 mLs by nebulization every 4 (four) hours as needed. For shortness of breath/wheezing) 180 mL 3 10/30/2015 at Unknown time  .  loratadine (CLARITIN) 10 MG tablet Take 10 mg by mouth daily.   10/29/2015 at Unknown time  . metoprolol tartrate (LOPRESSOR) 25 MG tablet Take 12.5 mg by mouth 2 (two) times daily.   10/19/2015  . Multiple Vitamin (MULTIVITAMIN WITH MINERALS) TABS tablet Take 1 tablet by mouth daily.   Past Week at Unknown time  . nitroGLYCERIN (NITROSTAT) 0.4 MG SL tablet Place 1 tablet (0.4 mg total) under the tongue every 5 (five) minutes as needed. For chest pain (  Patient taking differently: Place 0.4 mg under the tongue every 5 (five) minutes as needed for chest pain. ) 25 tablet 6 unknown  . OxyCODONE HCl, Abuse Deter, (OXAYDO) 5 MG TABA Take 1 tablet by mouth 2 (two) times daily as needed (for pain).    10/30/2015 at 0300  . pregabalin (LYRICA) 25 MG capsule Take 1 capsule (25 mg total) by mouth 2 (two) times daily. 20 capsule 0 10/30/2015 at 0630  . Tamsulosin HCl (FLOMAX) 0.4 MG CAPS Take 0.4 mg by mouth at bedtime.    10/29/2015 at Unknown time  . tiotropium (SPIRIVA HANDIHALER) 18 MCG inhalation capsule INHALE 1 CAPSULE VIA HANDIHALER ONCE DAILY AT THE SAME TIME EVERY DAY (Patient taking differently: Place 18 mcg into inhaler and inhale daily. ) 30 capsule 5 10/30/2015 at 0630    Physical Exam: Vitals:   Vitals:   10/30/15 1330 10/30/15 1400 10/30/15 1416 10/30/15 1430  BP: 98/65 124/100 124/100 95/73  Pulse: (!) 151  (!) 153 (!) 153  Resp: '20 19 14 23  '$ Temp:      TempSrc:      SpO2: 98%  100% 99%  Height:       I&O's:  No intake or output data in the 24 hours ending 10/30/15 1501 Physical exam:  [place]/AT EOMI No JVD, No carotid bruit Tachycardic S1S2  No wheezing Soft. NT, nondistended No edema. No focal motor or sensory deficits Normal affect No rash  Labs:   Lab Results  Component Value Date   WBC 9.6 10/30/2015   HGB 14.3 10/30/2015   HCT 42.0 10/30/2015   MCV 93.4 10/30/2015   PLT 369 10/30/2015    Recent Labs Lab 10/30/15 1000 10/30/15 1014  NA 131* 131*  K 3.7 3.8  CL 95*  94*  CO2 28  --   BUN 13 13  CREATININE 1.02 1.00  CALCIUM 8.9  --   PROT 6.6  --   BILITOT 0.7  --   ALKPHOS 80  --   ALT 21  --   AST 19  --   GLUCOSE 121* 118*   Lab Results  Component Value Date   CKTOTAL 49 12/23/2010   CKMB 1.9 12/23/2010   TROPONINI <0.03 10/14/2015    Lab Results  Component Value Date   CHOL 157 10/30/2014   CHOL 161 03/04/2014   CHOL 169 12/10/2013   Lab Results  Component Value Date   HDL 44.50 10/30/2014   HDL 47.80 03/04/2014   HDL 46.60 12/10/2013   Lab Results  Component Value Date   LDLCALC 102 (H) 10/30/2014   LDLCALC 99 03/04/2014   LDLCALC 98 12/10/2013   Lab Results  Component Value Date   TRIG 56.0 10/30/2014   TRIG 72.0 03/04/2014   TRIG 124.0 12/10/2013   Lab Results  Component Value Date   CHOLHDL 4 10/30/2014   CHOLHDL 3 03/04/2014   CHOLHDL 4 12/10/2013   No results found for: LDLDIRECT    Radiology: Small right pleural effusion EKG: Atrial flutter with 21 conduction  ASSESSMENT AND PLAN:  Principal Problem:   Atrial flutter with rapid ventricular response (HCC) Active Problems:   Pure hypercholesterolemia   HYPERTENSION, BENIGN   MYOCARDIAL INFARCTION, HX OF   CORONARY ATHEROSCLEROSIS NATIVE CORONARY ARTERY   PERIPHERAL VASCULAR DISEASE   Chronic obstructive pulmonary disease ([place])   History of prostate cancer   Nicotine dependence   Atrial flutter (HCC)  1) rate control with IV diltiazem.  Since he is asymptomatic,  I would like to try to avoid TEE cardioversion if possible. Hopefully, with oral meds, his rate can be controlled. 2) chads Vasc  score is high given prior TIA, hypertension and age.  Stop clopidogrel. Start Eliquis for stroke prevention. 3) coronary artery disease: No angina. Continue low-dose aspirin for now. 4) HTN: Controlled. This patients CHA2DS2-VASc Score and unadjusted Ischemic Stroke Rate (% per year) is equal to 9.7 % stroke rate/year from a score of 6  Above score calculated as  1 point each if present [CHF, HTN, DM, Vascular=MI/PAD/Aortic Plaque, Age if 65-74, or Male] Above score calculated as 2 points each if present [Age > 75, or Stroke/TIA/TE]    Signed:   Mina Marble, MD, [facility] 10/30/2015, 3:01 PM

## 2015-10-30 NOTE — Progress Notes (Addendum)
[name] is here for follow up.  He reports he fell on 7/25 and hit his side on his bathtub.  He broke the 5-12 ribs on his right side.  He is still having pain and is rating it at a 5/10.  He is taking oxycodone 5 mg bid and lyrica.  He continues to report shortness of breath with walking.  He reports having a productive cough with pale green sputum.  He denies having hemopytsis.  He reports having fatigue and also has had a lose of appetite.  His heart rate was elevated at 154.  He is in SVT on the monitor with a rate of 149-155.  He reports he has not been taking metoprolol since his fall.  BP (!) 118/56 (BP Location: Right Arm, Patient Position: Sitting)   Pulse (!) 154   Temp 97.7 F (36.5 C) (Oral)   Ht '5\' 6"'$  (1.676 m)   Wt 160 lb 11.2 oz (72.9 kg)   SpO2 100%   BMI 25.94 kg/m    Wt Readings from Last 3 Encounters:  10/30/15 160 lb 11.2 oz (72.9 kg)  10/24/15 161 lb (73 kg)  10/14/15 165 lb (74.8 kg)

## 2015-10-30 NOTE — Progress Notes (Signed)
Called by the nurse due to intermittent hypotension and high heart rates.  Patient not tolerating higher doses of Cardizem.  Discussed TEE/CV with patient for restoration of NSR.  Also discussed with the patient's daughter.  THey are agreeable.  Will consider in AM if he is still in Atrial flutter.  Make NPO past midnight.  Jettie Booze, MD

## 2015-10-30 NOTE — Progress Notes (Signed)
   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  He reports he fell on 7/25 and hit his side on his bathtub.  He broke the 5-12 ribs on his right side and suffered from a right pneumothorax.   He is still having pain and is rating it at a 5/10, and reports this pain as persisting for three weeks.  He is taking oxycodone 5 mg bid and lyrica.  He continues to report shortness of breath with walking.  He reports having a productive cough with pale green sputum.  He denies having hemopytsis.  He reports having fatigue and also has had a lose of appetite.  His heart rate was elevated at 154.  He has not been taking metoprolol. The patient reports that he was hospitalized after his fall at [facility] for a couple of days the 25th of July. Patient denies shortness of breath while sedentary and chest pain.   Physical Exam:  Vitals:   10/30/15 0823  BP: (!) 118/56  Pulse: (!) 154  Temp: 97.7 F (36.5 C)  TempSrc: Oral  SpO2: 100%  Weight: 160 lb 11.2 oz (72.9 kg)  Height: '5\' 6"'$  (1.676 m)  General: Alert and oriented, in no acute distress Neck: Neck is supple, no palpable cervical or supraclavicular lymphadenopathy. Heart: Regular in rate and rhythm  but has an increased rate.  Chest: Clear to auscultation bilaterally, with no rhonchi, wheezes, or rales. Abdomen: Soft, nontender, nondistended, with no rigidity or guarding. Extremities: No cyanosis or edema. Lymphatics: see Neck Exam Skin: No concerning  lesions. Musculoskeletal: symmetric strength and muscle tone throughout. Neurologic: Cranial nerves II through XII are grossly intact. No obvious focalities. Speech is fluent. Coordination is intact.      IMPRESSION: [name] is a 75 y.o. male s/p SBRT. I informed the patient that his chest CT scan is concerning for progressive bilateral pulmonary nodules.  Lesions appear to be too small to biopsy at this time.  I will recommend a repeat chest CT in three months, and will follow up soon after with the patient. The lesion treated in the right upper lung area has decreased further in size SBRT was completed  PLAN:    I will recommend a repeat chest CT in three months, and will follow up soon after. We also spoke to the patient's Cardiologist, and he recommended that due to the patient's elevated heart rate, that he should be admitted to the ER for further workup and evaluation.           This document serves as a record of services personally performed by Gery Pray, MD. It was created on his behalf by Truddie Hidden, a trained medical scribe. The creation of this record is based on the scribe's personal observations and the provider's statements to them. This document has been checked and approved by the attending provider.

## 2015-10-31 ENCOUNTER — Inpatient Hospital Stay (HOSPITAL_COMMUNITY): Payer: Medicare Other

## 2015-10-31 ENCOUNTER — Encounter (HOSPITAL_COMMUNITY)
Admission: EM | Disposition: A | Discharge status: Home / Self Care | Payer: Self-pay | Source: Home / Self Care | Attending: Family Medicine

## 2015-10-31 ENCOUNTER — Inpatient Hospital Stay (HOSPITAL_BASED_OUTPATIENT_CLINIC_OR_DEPARTMENT_OTHER): Payer: Medicare Other

## 2015-10-31 ENCOUNTER — Inpatient Hospital Stay (HOSPITAL_COMMUNITY): Payer: Medicare Other | Admitting: Certified Registered"

## 2015-10-31 ENCOUNTER — Encounter (HOSPITAL_COMMUNITY): Payer: Self-pay | Admitting: *Deleted

## 2015-10-31 DIAGNOSIS — I2510 Atherosclerotic heart disease of native coronary artery without angina pectoris: Secondary | ICD-10-CM | POA: Diagnosis not present

## 2015-10-31 DIAGNOSIS — I739 Peripheral vascular disease, unspecified: Secondary | ICD-10-CM | POA: Diagnosis not present

## 2015-10-31 DIAGNOSIS — I340 Nonrheumatic mitral (valve) insufficiency: Secondary | ICD-10-CM | POA: Diagnosis not present

## 2015-10-31 DIAGNOSIS — Z8546 Personal history of malignant neoplasm of prostate: Secondary | ICD-10-CM | POA: Diagnosis not present

## 2015-10-31 DIAGNOSIS — Z9181 History of falling: Secondary | ICD-10-CM | POA: Diagnosis not present

## 2015-10-31 DIAGNOSIS — E7800 Pure hypercholesterolemia, unspecified: Secondary | ICD-10-CM

## 2015-10-31 DIAGNOSIS — I351 Nonrheumatic aortic (valve) insufficiency: Secondary | ICD-10-CM | POA: Diagnosis not present

## 2015-10-31 DIAGNOSIS — I959 Hypotension, unspecified: Secondary | ICD-10-CM | POA: Diagnosis not present

## 2015-10-31 DIAGNOSIS — I4892 Unspecified atrial flutter: Secondary | ICD-10-CM

## 2015-10-31 DIAGNOSIS — Q211 Atrial septal defect: Secondary | ICD-10-CM | POA: Diagnosis not present

## 2015-10-31 DIAGNOSIS — I509 Heart failure, unspecified: Secondary | ICD-10-CM | POA: Diagnosis not present

## 2015-10-31 DIAGNOSIS — I10 Essential (primary) hypertension: Secondary | ICD-10-CM | POA: Diagnosis not present

## 2015-10-31 DIAGNOSIS — K219 Gastro-esophageal reflux disease without esophagitis: Secondary | ICD-10-CM | POA: Diagnosis not present

## 2015-10-31 DIAGNOSIS — I5022 Chronic systolic (congestive) heart failure: Secondary | ICD-10-CM | POA: Diagnosis not present

## 2015-10-31 DIAGNOSIS — I255 Ischemic cardiomyopathy: Secondary | ICD-10-CM | POA: Diagnosis not present

## 2015-10-31 DIAGNOSIS — I714 Abdominal aortic aneurysm, without rupture: Secondary | ICD-10-CM | POA: Diagnosis not present

## 2015-10-31 DIAGNOSIS — J449 Chronic obstructive pulmonary disease, unspecified: Secondary | ICD-10-CM | POA: Diagnosis not present

## 2015-10-31 DIAGNOSIS — I350 Nonrheumatic aortic (valve) stenosis: Secondary | ICD-10-CM | POA: Diagnosis not present

## 2015-10-31 DIAGNOSIS — I4891 Unspecified atrial fibrillation: Secondary | ICD-10-CM | POA: Diagnosis not present

## 2015-10-31 DIAGNOSIS — I110 Hypertensive heart disease with heart failure: Secondary | ICD-10-CM | POA: Diagnosis not present

## 2015-10-31 DIAGNOSIS — C3411 Malignant neoplasm of upper lobe, right bronchus or lung: Secondary | ICD-10-CM | POA: Diagnosis not present

## 2015-10-31 DIAGNOSIS — I361 Nonrheumatic tricuspid (valve) insufficiency: Secondary | ICD-10-CM | POA: Diagnosis not present

## 2015-10-31 DIAGNOSIS — I252 Old myocardial infarction: Secondary | ICD-10-CM

## 2015-10-31 HISTORY — PX: TEE WITHOUT CARDIOVERSION: SHX5443

## 2015-10-31 HISTORY — PX: CARDIOVERSION: SHX1299

## 2015-10-31 LAB — BASIC METABOLIC PANEL
Anion gap: 5 (ref 5–15)
BUN: 11 mg/dL (ref 6–20)
CHLORIDE: 102 mmol/L (ref 101–111)
CO2: 25 mmol/L (ref 22–32)
Calcium: 8 mg/dL — ABNORMAL LOW (ref 8.9–10.3)
Creatinine, Ser: 0.88 mg/dL (ref 0.61–1.24)
GFR calc Af Amer: >60 mL/min (ref >60)
Glucose, Bld: 106 mg/dL — ABNORMAL HIGH (ref 65–99)
POTASSIUM: 3.6 mmol/L (ref 3.5–5.1)
SODIUM: 132 mmol/L — AB (ref 135–145)

## 2015-10-31 LAB — CBC
HEMATOCRIT: 33.7 % — AB (ref 39.0–52.0)
Hemoglobin: 11.5 g/dL — ABNORMAL LOW (ref 13.0–17.0)
MCH: 32 pg (ref 26.0–34.0)
MCHC: 34.1 g/dL (ref 30.0–36.0)
MCV: 93.9 fL (ref 78.0–100.0)
PLATELETS: 307 10*3/uL (ref 150–400)
RBC: 3.59 MIL/uL — ABNORMAL LOW (ref 4.22–5.81)
RDW: 13.5 % (ref 11.5–15.5)
WBC: 6.8 10*3/uL (ref 4.0–10.5)

## 2015-10-31 LAB — ECHOCARDIOGRAM COMPLETE
HEIGHTINCHES: 69 in
WEIGHTICAEL: 2631.41 [oz_av]

## 2015-10-31 SURGERY — ECHOCARDIOGRAM, TRANSESOPHAGEAL
Anesthesia: Monitor Anesthesia Care

## 2015-10-31 MED ORDER — SODIUM CHLORIDE 0.9 % IV SOLN
250.0000 mL | INTRAVENOUS | Status: DC
  Administered 2015-10-31: 250 mL via INTRAVENOUS

## 2015-10-31 MED ORDER — PHENYLEPHRINE HCL 10 MG/ML IJ SOLN
INTRAMUSCULAR | Status: DC | PRN
  Administered 2015-10-31 (×2): 80 ug via INTRAVENOUS
  Administered 2015-10-31 (×2): 120 ug via INTRAVENOUS

## 2015-10-31 MED ORDER — BUTAMBEN-TETRACAINE-BENZOCAINE 2-2-14 % EX AERO
INHALATION_SPRAY | CUTANEOUS | Status: DC | PRN
  Administered 2015-10-31: 2 via TOPICAL

## 2015-10-31 MED ORDER — SODIUM CHLORIDE 0.9% FLUSH
3.0000 mL | Freq: Two times a day (BID) | INTRAVENOUS | Status: DC
  Administered 2015-10-31 (×2): 3 mL via INTRAVENOUS

## 2015-10-31 MED ORDER — SODIUM CHLORIDE 0.9% FLUSH: 3.0000 mL | INTRAVENOUS | Status: DC | PRN

## 2015-10-31 MED ORDER — FENTANYL CITRATE (PF) 100 MCG/2ML IJ SOLN: 25.0000 ug | INTRAMUSCULAR | Status: DC | PRN

## 2015-10-31 MED ORDER — DIGOXIN 0.25 MG/ML IJ SOLN
0.2500 mg | Freq: Once | INTRAMUSCULAR | Status: AC
  Administered 2015-10-31: 0.25 mg via INTRAVENOUS
  Filled 2015-10-31: qty 1

## 2015-10-31 MED ORDER — PROPOFOL 10 MG/ML IV BOLUS
INTRAVENOUS | Status: DC | PRN
  Administered 2015-10-31: 40 mg via INTRAVENOUS

## 2015-10-31 MED ORDER — LIDOCAINE HCL (CARDIAC) 20 MG/ML IV SOLN
INTRAVENOUS | Status: DC | PRN
  Administered 2015-10-31: 100 mg via INTRATRACHEAL

## 2015-10-31 MED ORDER — PROPOFOL 500 MG/50ML IV EMUL
INTRAVENOUS | Status: DC | PRN
  Administered 2015-10-31: 75 ug/kg/min via INTRAVENOUS

## 2015-10-31 MED ORDER — SODIUM CHLORIDE 0.9 % IV SOLN
INTRAVENOUS | Status: DC
  Administered 2015-10-31: 13:00:00 via INTRAVENOUS

## 2015-10-31 MED ORDER — MEPERIDINE HCL 100 MG/ML IJ SOLN: 6.2500 mg | INTRAMUSCULAR | Status: DC | PRN

## 2015-10-31 NOTE — Transfer of Care (Signed)
Immediate Anesthesia Transfer of Care Note  Patient: [name]  Procedure(s) Performed: Procedure(s): TRANSESOPHAGEAL ECHOCARDIOGRAM (TEE) (N/A) CARDIOVERSION (N/A)  Patient Location: Endoscopy Unit  Anesthesia Type:MAC  Level of Consciousness: awake, alert  and patient cooperative  Airway & Oxygen Therapy: Patient Spontanous Breathing and Patient connected to nasal cannula oxygen  Post-op Assessment: Report given to RN and Post -op Vital signs reviewed and stable  Post vital signs: Reviewed and stable  Last Vitals:  Vitals:   10/31/15 1154 10/31/15 1325  BP: (!) 141/97 101/65  Pulse: (!) 136 78  Resp: 19 16  Temp:      Last Pain:  Vitals:   10/31/15 0800  TempSrc: Oral  PainSc:       Patients Stated Pain Goal: 0 (15/52/08 0223)  Complications: No apparent anesthesia complications

## 2015-10-31 NOTE — Anesthesia Procedure Notes (Signed)
Procedure Name: MAC Date/Time: 10/31/2015 12:52 PM Performed by: Lance Coon Pre-anesthesia Checklist: Patient identified, Emergency Drugs available, Suction available, Patient being monitored and Timeout performed Patient Re-evaluated:Patient Re-evaluated prior to inductionOxygen Delivery Method: Nasal cannula Intubation Type: IV induction

## 2015-10-31 NOTE — Anesthesia Postprocedure Evaluation (Signed)
Anesthesia Post Note  Patient: [name]  Procedure(s) Performed: Procedure(s) (LRB): TRANSESOPHAGEAL ECHOCARDIOGRAM (TEE) (N/A) CARDIOVERSION (N/A)  Patient location during evaluation: PACU Anesthesia Type: MAC Level of consciousness: awake and alert Pain management: pain level controlled Vital Signs Assessment: post-procedure vital signs reviewed and stable Respiratory status: spontaneous breathing, nonlabored ventilation and respiratory function stable Cardiovascular status: stable and blood pressure returned to baseline Anesthetic complications: no    Last Vitals:  Vitals:   10/31/15 1325 10/31/15 1335  BP: 101/65 (!) 109/59  Pulse: 78 79  Resp: 16 20  Temp:      Last Pain:  Vitals:   10/31/15 0800  TempSrc: Oral  PainSc:                  Laina Guerrieri A

## 2015-10-31 NOTE — Progress Notes (Signed)
Echocardiogram Echocardiogram Transesophageal has been performed.  Aggie Cosier 10/31/2015, 1:18 PM

## 2015-10-31 NOTE — Progress Notes (Signed)
Hospital Problem List     Principal Problem:   Atrial flutter with rapid ventricular response (HCC) Active Problems:   Pure hypercholesterolemia   HYPERTENSION, BENIGN   MYOCARDIAL INFARCTION, HX OF   CORONARY ATHEROSCLEROSIS NATIVE CORONARY ARTERY   PERIPHERAL VASCULAR DISEASE   Chronic obstructive pulmonary disease (HCC)   History of prostate cancer   Nicotine dependence   Atrial flutter [facility])     Patient Profile:   Primary Cardiologist: Dr. [name]  75 yo male w/ PMH of CAD (s/p multiple PCI to LAD and LCx), carotid artery disease, ischemic cardiomyopathy, HTN, and HLD who presented to [facility] ED on 10/30/2015 after he was found to be in atrial flutter at his Oncology appointment.   Subjective   Remains in atrial flutter, HR in the 110's - 140's. Denies any chest discomfort or palpitations. Has been NPO since midnight.   Inpatient Medications    . antiseptic oral rinse  7 mL Mouth Rinse q12n4p  . apixaban  5 mg Oral BID  . aspirin EC  81 mg Oral QHS  . chlorhexidine  15 mL Mouth Rinse BID  . loratadine  10 mg Oral Daily  . metoprolol tartrate  25 mg Oral BID  . mometasone-formoterol  2 puff Inhalation BID  . multivitamin with minerals  1 tablet Oral Daily  . nicotine  7 mg Transdermal QHS  . pregabalin  25 mg Oral BID  . sodium chloride flush  3 mL Intravenous Q12H  . tamsulosin  0.4 mg Oral QHS  . tiotropium  18 mcg Inhalation Daily    Vital Signs    Vitals:   10/31/15 0400 10/31/15 0411 10/31/15 0500 10/31/15 0600  BP:  99/68 104/70 (!) 96/54  Pulse:      Resp:  '13 15 14  '$ Temp: 98.1 F (36.7 C)     TempSrc: Oral     SpO2:  98% 97% 99%  Weight: 164 lb 7.4 oz (74.6 kg)     Height:        Intake/Output Summary (Last 24 hours) at 10/31/15 8299 Last data filed at 10/31/15 0700  Gross per 24 hour  Intake             2240 ml  Output              475 ml  Net             1765 ml   Filed Weights   10/30/15 1518 10/31/15 0400  Weight: 159 lb 9.8 oz (72.4  kg) 164 lb 7.4 oz (74.6 kg)    Physical Exam    General: Well developed, well nourished, male appearing in no acute distress. Head: Normocephalic, atraumatic.  Neck: Supple without bruits, JVD not elevated. Lungs:  Resp regular and unlabored, CTA without wheezing or rales. Heart: Tachycardia, irregularly irregular, S1, S2, no S3, S4, or murmur; no rub. Abdomen: Soft, non-tender, non-distended with normoactive bowel sounds. No hepatomegaly. No rebound/guarding. No obvious abdominal masses. Extremities: No clubbing, cyanosis, or edema. Distal pedal pulses are 2+ bilaterally. Neuro: Alert and oriented X 3. Moves all extremities spontaneously. Psych: Normal affect.  Labs    CBC  Recent Labs  10/30/15 1000 10/30/15 1014 10/31/15 0313  WBC 9.6  --  6.8  NEUTROABS 8.1*  --   --   HGB 13.7 14.3 11.5*  HCT 39.4 42.0 33.7*  MCV 93.4  --  93.9  PLT 369  --  371   Basic Metabolic Panel  Recent Labs  10/30/15 1000 10/30/15 1014 10/31/15 0313  NA 131* 131* 132*  K 3.7 3.8 3.6  CL 95* 94* 102  CO2 28  --  25  GLUCOSE 121* 118* 106*  BUN '13 13 11  '$ CREATININE 1.02 1.00 0.88  CALCIUM 8.9  --  8.0*   Liver Function Tests  Recent Labs  10/30/15 1000  AST 19  ALT 21  ALKPHOS 80  BILITOT 0.7  PROT 6.6  ALBUMIN 3.6   Thyroid Function Tests  Recent Labs  10/30/15 1846  TSH 5.114*    Telemetry    Atrial fibrillation, HR 110's - 140's.   ECG    No new tracings.    Cardiac Studies and Radiology    Dg Ribs Unilateral W/chest Right  Result Date: 10/14/2015 CLINICAL DATA:  Fall. Right lateral rib pain. Syncopal episode in the bathroom at 5:45 a.m. today. Right rib and shoulder pain. EXAM: RIGHT RIBS AND CHEST - 3+ VIEW COMPARISON:  CT of the chest 07/16/2015. Two-view chest x-ray 05/22/2015. FINDINGS: The heart size is normal. Nodular densities in the right upper lobe are somewhat more conspicuous than on the prior exam. Minimally displaced right eighth, ninth, and  tenth rib fractures are noted. There may be fractures involving the eleventh and twelfth ribs is well. A small right pleural effusion is present. There is some pleural air suggesting small pneumothorax. Minimal right basilar airspace disease is noted. Coronary artery stents are evident. IMPRESSION: 1. Acute right eighth, ninth, and tenth rib fractures are minimally displaced. 2. Possible fractures in the eleventh and twelfth ribs as well. 3. Small right pneumothorax. 4. Small right pleural effusion and associated airspace disease. This likely represents a small hemothorax and minimal atelectasis. 5. Coronary artery disease. 6. Slight increased conspicuity of right upper lobe nodular disease. These results were called by telephone at the time of interpretation on 10/14/2015 at 9:46 am to Dr. Harvest Dark , who verbally acknowledged these results. Electronically Signed   By: San Morelle M.D.   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 Additional remote history of prostate cancer. EXAM: CT CHEST WITH CONTRAST TECHNIQUE: Multidetector CT imaging of the chest was performed during intravenous contrast administration. CONTRAST:  13m ISOVUE-300 IOPAMIDOL (ISOVUE-300) INJECTION 61% COMPARISON:  07/16/2015 chest CT. FINDINGS: Mediastinum/Nodes: Normal heart size. Trace pericardial fluid/thickening, not appreciably changed. Left main, left anterior descending, left circumflex and right coronary atherosclerosis. Atherosclerotic nonaneurysmal thoracic aorta. Stable top-normal caliber main pulmonary artery (3.2 cm diameter). No central pulmonary emboli. Known small calcified right thyroid lobe nodule is obscured by streak artifact on the current chest CT study. Stable mild circumferential wall thickening in the lower third of the thoracic  esophagus. No pathologically enlarged axillary, mediastinal or hilar lymph nodes. Top-normal caliber 0.7 cm left lower paraesophageal node (series 2/image 90), increased slightly from 0.6 cm. Lungs/Pleura: No pneumothorax. New (since 04/15/2015 chest CT) small layering right pleural effusion. No left pleural effusion. Mild centrilobular and paraseptal emphysema. Peripheral right upper lobe cavitary 1.2 x 1.1 cm pulmonary nodule (series 5/ image 41), previously 1.3 x 1.2 cm, not appreciably changed. Three new solid right upper lobe pulmonary nodules, largest 7 mm (series 5/ image 31). Mild-to-moderate compressive atelectasis in the dependent/basilar right lower lobe. Peripheral right lower lobe 6 mm solid pulmonary nodule (series 5/image 70), increased from 4 mm. Four enlarging  left upper lobe pulmonary nodules, largest 5 mm (series 5/image 56), increased from 2 mm. Upper abdomen: Probable small hiatal hernia. Partially visualize simple appearing exophytic renal cyst in the posterior upper left kidney measuring at least 7.7 cm. Musculoskeletal: No aggressive appearing focal osseous lesions. Acute posterior right eighth through eleventh rib fractures, mildly displaced in the ninth and tenth ribs. Minimal thoracic spondylosis. IMPRESSION: 1. At least 8 new or enlarging scattered pulmonary nodules in both lungs, largest 7 mm in the right upper lobe, worrisome for pulmonary metastases. 2. Primary cavitary right upper lobe lung malignancy is stable. 3. Top-normal size left lower paraesophageal mediastinal lymph node is increased slightly and could represent a nodal metastasis, recommend attention on follow-up chest CT. 4. New small layering right pleural effusion. 5. Acute posterior right eighth through eleventh rib fractures. No pneumothorax. 6. Aortic atherosclerosis. Left main and 3 vessel coronary atherosclerosis. 7. Stable nonspecific mild circumferential wall thickening in the lower thoracic esophagus with probable  small hiatal hernia. Electronically Signed   By: Ilona Sorrel M.D.   On: 10/27/2015 15:41   Dg Chest Port 1 View  Result Date: 10/30/2015 CLINICAL DATA:  Tachycardia. Atrial fibrillation. History of non small cell lung cancer. EXAM: PORTABLE CHEST 1 VIEW COMPARISON:  Chest x-ray dated 10/14/2015 and chest CT dated 10/27/2015 FINDINGS: Heart size is increased since the prior chest x-ray. Pulmonary vascularity is normal. Persistent small right pleural effusion. Healing and old right rib fractures. Surgical clips and scarring in the right upper lobe are less apparent. Small pulmonary nodules demonstrated on the prior CT scan are not apparent on this chest x-ray. IMPRESSION: Slight increase in cardiac size since the prior chest x-ray. Persistent small right pleural effusion. Electronically Signed   By: Lorriane Shire M.D.   On: 10/30/2015 10:24   Dg Abd 2 Views  Result Date: 10/24/2015 CLINICAL DATA:  Right lower quadrant and periumbilical burning sensation. History of lung malignancy, recently hospitalized for rib fractures and a pneumothorax. History of CVA, COPD, coronary artery disease, abdominal aortic aneurysm. disease. EXAM: ABDOMEN - 2 VIEW COMPARISON:  Images from a PET-CT study of January 13, 2015 FINDINGS: The colonic stool burden is increased diffusely. No small or large bowel obstructive pattern is observed. There are no free extraluminal gas collections. No abnormal soft tissue calcifications are observed. There is mild degenerative change of the lumbar spine with gentle dextrocurvature centered at L3-4. There is volume loss at the right lung base. IMPRESSION: Increase colonic stool burden is consistent with constipation in the appropriate clinical setting. There is no small or large bowel obstructive pattern. The known 4.2 cm abdominal aortic aneurysm demonstrated on the previous PET-CT study is not clearly evident on today's study. Repeat abdominal and pelvic CT scanning may be useful if the  patient's symptoms remain unexplained. Electronically Signed   By: David  Martinique M.D.   On: 10/24/2015 16:46    Echocardiogram: Pending  Assessment & Plan    1. New onset Atrial Flutter - seen in the cancer center on 8/10 and noted to be in new onset atrial flutter. Unknown duration as he is asymptomatic with this. HR has been in the 130's - 140's since admission. - TSH 5.114. Will check free T4. Repeat echo pending.  - started on IV Cardizem at time of admission but developed hypotension with SBP in the 60's - 70's. Has received 2L fluid bolus. Remains in atrial fibrillation this AM with HR in the 130's and SBP in the 90's. Unable to  titrate Cardizem. Will administer dose of IV Digoxin. Discussed with Dr. Irish Lack, will proceed with TEE/DCCV as laid out in his note from yesterday. Patient and daughter made aware and agree to proceed. Has been scheduled at [facility] at 1300. [facility] has been contacted for transport to arrive at [facility] by 11:30AM. - This patients CHA2DS2-VASc Score and unadjusted Ischemic Stroke Rate (% per year) is equal to 11.2 % stroke rate/year from a score of 7 (CHF, HTN, Vascular, Age (2), TIA (2)). Started on Eliquis and would have received 3 doses prior to TEE/DCCV today.   2. CAD - s/p multiple PCI to LAD and LCx - denies any recent anginal symptoms.  - continue ASA and BB. Plavix stopped now that he is on Eliquis. Unclear why he is not on statin therapy (was on Pravastatin in the past).    3. Lung Cancer - currently undergoing radiation.  - per admitting team.   Signed, Erma Heritage , PA-C 8:07 AM 10/31/2015 Pager: [phone]   I have examined the patient and reviewed assessment and plan and discussed with patient.  Agree with above as stated.  HR still elevated despite IV dig given earlier today.  He is still asymptomatic.  I discussed with the patient and daughter the plan for TEE/CV.  They are aware that if there is thrombus noted by TEE, we  would not pursue cardioversion and would have to try other means of rate control.  We also spoke about long term anticoagulation.  Will have to figure out which med to use based on cost.  The donut hole has been a problem. Can get him 30 days of Eliquis at least to start with.  Larae Grooms

## 2015-10-31 NOTE — CV Procedure (Signed)
    PROCEDURE NOTE:  Procedure:  Transesophageal echocardiogram Operator:  [name], MD Indications:  Atrial flutter with RVR Complications: None  During this procedure the patient is administered a total of Propofol 210 mg and Lidocaine '100mg'$  to achieve and maintain  sedation.  The patient's heart rate, blood pressure, and oxygen saturation are monitored continuously during the procedure by anesthesia.   Results: Normal LV size and moderately reduced LVF.  Due to tachycardia difficult to estimate EF but appears around 35%. Normal RV size and function Mildly dilated RA Moderately dilated LA with no evidence of thrombus.  The LA appendage is moderate sized with no evidence of thrombus.  Normal emptying velocity of 40-80. Normal TV with mild to moderate TR Normal PV with trivial PR Mildly calcified MV with mild MR Moderately thickened and focal heavy calcification of the noncoronary and right coronary cusps with at least mild aortic stenosis and trivial AI. Aneurysm and hypermobile interatrial septum with evidence of shunt by colorflow dopper and confirmed by agitated saline contrast study - + PFO Moderate to severe atherosclerotic plaque in the thoracic and ascending aorta.  The patient tolerated the procedure well and went on to DCCV.  Signed: [name], MD [facility]     Electrical Cardioversion Procedure Note [name] [id number] 10-14-1940  Procedure: Electrical Cardioversion Indications:  Atrial Flutter with RVR at 144bpm  Time Out: Verified patient identification, verified procedure,medications/allergies/relevent history reviewed, required imaging and test results available.  Performed  Procedure Details  The patient was NPO after midnight. Anesthesia was administered at the beside  by Dr.Cruz.  Cardioversion was done with synchronized biphasic defibrillation with AP pads with 150watts.  The patient converted to normal sinus rhythm. The patient  tolerated the procedure well   IMPRESSION:  Successful cardioversion of atrial flutter with RVR to NSR.  Of note, patient had significant snoring during the procedure and should have a sleep study done as an outpt to evaluate for OSA.    Consider referral to EP for possible aflutter ablation given underlying pulmonary issues that will increase risk of recurrent atrial arrhythmias.  Traci Turner 10/31/2015, 12:57 PM

## 2015-10-31 NOTE — Progress Notes (Signed)
PROGRESS NOTE    [name]  MBT:[id number] DOB: 05/12/1940 DOA: 10/30/2015 PCP: Loura Pardon, MD   Brief Narrative: Patient has a history of stroke, CAD, MI, hypertension, COPD, non-small cell lung carcinoma that presented to the [facility] emergency department after incidental finding of tachycardia while at his radiation oncologist office. They [place] he was found to have atrial flutter with RVR rate in 150s. Cardizem drip was started, cardiology consultation, patient started on Eliquis. Chads vssc 2 risk score of at least 5   Assessment & Plan:   Principal Problem:   Atrial flutter with rapid ventricular response (HCC) Active Problems:   Pure hypercholesterolemia   HYPERTENSION, BENIGN   MYOCARDIAL INFARCTION, HX OF   CORONARY ATHEROSCLEROSIS NATIVE CORONARY ARTERY   PERIPHERAL VASCULAR DISEASE   Chronic obstructive pulmonary disease (HCC)   History of prostate cancer   Nicotine dependence   Atrial flutter (HCC)   New-onset atrial flutter with RVR Chads Vasc 2 risk score of 5. 2 is still tachycardic on Cardizem drip. Patient was hypotensive last night and cardiology suggested TEE cardioversion. Patient normotensive today. -Patient to be transferred to [facility] for TEE cardioversion -Continue Cardizem drip -Continue Lopressor -Echocardiogram pending -TSH pending  Essential hypertension Continue Lopressor  Hyperlipidemia -Continue statin  COPD At baseline. Not requiring oxygen. -Continue nebulizer treatments when necessary  History of prostate cancer In remission -Continue Flomax  DVT prophylaxis: Eliquis  Code Status: Full code  Family Communication: Daughter at bedside  Disposition Plan: Plan for TEE cardioversion today with possible discharge home if successful    Consultants:   Cardiology, Dr. Irish Lack  Procedures:  TEE cardioversion scheduled for today (8/11)  Antimicrobials:  None    Subjective: Patient reports no symptoms  of chest pain, fluttering, dyspnea, or lightheadedness. He does report some right-sided rib pain which is chronic since having a rib fracture.  Objective: Vitals:   10/31/15 1000 10/31/15 1030 10/31/15 1031 10/31/15 1100  BP: (!) 110/91 108/83 108/83 (!) 119/97  Pulse:  (!) 135    Resp: (!) '21 20 19 '$ (!) 22  Temp:      TempSrc:      SpO2: 100% 100% 100% 98%  Weight:      Height:        Intake/Output Summary (Last 24 hours) at 10/31/15 1145 Last data filed at 10/31/15 1100  Gross per 24 hour  Intake             2310 ml  Output              725 ml  Net             1585 ml   Filed Weights   10/30/15 1518 10/31/15 0400  Weight: 72.4 kg (159 lb 9.8 oz) 74.6 kg (164 lb 7.4 oz)    Examination:  General exam: Appears calm and comfortable  Respiratory system: Clear to auscultation. Respiratory effort normal. Cardiovascular system: S1 & S2 heard. Irregular rhythm with slightly increased rate. No murmurs, rubs, gallops or clicks. No pedal edema. Gastrointestinal system: Abdomen is nondistended, soft and nontender. No organomegaly or masses felt. Normal bowel sounds heard. Central nervous system: Alert and oriented. No focal neurological deficits. Extremities: Symmetric 5 x 5 power. Skin: No rashes, lesions or ulcers Psychiatry: Judgement and insight appear normal. Mood & affect appropriate.     Data Reviewed: I have personally reviewed following labs and imaging studies  CBC:  Recent Labs Lab 10/30/15 1000 10/30/15 1014 10/31/15 0313  WBC 9.6  --  6.8  NEUTROABS 8.1*  --   --   HGB 13.7 14.3 11.5*  HCT 39.4 42.0 33.7*  MCV 93.4  --  93.9  PLT 369  --  353   Basic Metabolic Panel:  Recent Labs Lab 10/30/15 1000 10/30/15 1014 10/31/15 0313  NA 131* 131* 132*  K 3.7 3.8 3.6  CL 95* 94* 102  CO2 28  --  25  GLUCOSE 121* 118* 106*  BUN '13 13 11  '$ CREATININE 1.02 1.00 0.88  CALCIUM 8.9  --  8.0*   GFR: Estimated Creatinine Clearance: 72.5 mL/min (by C-G formula  based on SCr of 0.88 mg/dL). Liver Function Tests:  Recent Labs Lab 10/30/15 1000  AST 19  ALT 21  ALKPHOS 80  BILITOT 0.7  PROT 6.6  ALBUMIN 3.6   No results for input(s): LIPASE, AMYLASE in the last 168 hours. No results for input(s): AMMONIA in the last 168 hours. Coagulation Profile: No results for input(s): INR, PROTIME in the last 168 hours. Cardiac Enzymes: No results for input(s): CKTOTAL, CKMB, CKMBINDEX, TROPONINI in the last 168 hours. BNP (last 3 results) No results for input(s): PROBNP in the last 8760 hours. HbA1C: No results for input(s): HGBA1C in the last 72 hours. CBG: No results for input(s): GLUCAP in the last 168 hours. Lipid Profile: No results for input(s): CHOL, HDL, LDLCALC, TRIG, CHOLHDL, LDLDIRECT in the last 72 hours. Thyroid Function Tests:  Recent Labs  10/30/15 1846  TSH 5.114*   Anemia Panel: No results for input(s): VITAMINB12, FOLATE, FERRITIN, TIBC, IRON, RETICCTPCT in the last 72 hours. Sepsis Labs: No results for input(s): PROCALCITON, LATICACIDVEN in the last 168 hours.  Recent Results (from the past 240 hour(s))  MRSA PCR Screening     Status: None   Collection Time: 10/30/15  2:50 PM  Result Value Ref Range Status   MRSA by PCR NEGATIVE NEGATIVE Final    Comment:        The GeneXpert MRSA Assay (FDA approved for NASAL specimens only), is one component of a comprehensive MRSA colonization surveillance program. It is not intended to diagnose MRSA infection nor to guide or monitor treatment for MRSA infections.          Radiology Studies: Dg Chest Port 1 View  Result Date: 10/30/2015 CLINICAL DATA:  Tachycardia. Atrial fibrillation. History of non small cell lung cancer. EXAM: PORTABLE CHEST 1 VIEW COMPARISON:  Chest x-ray dated 10/14/2015 and chest CT dated 10/27/2015 FINDINGS: Heart size is increased since the prior chest x-ray. Pulmonary vascularity is normal. Persistent small right pleural effusion. Healing and  old right rib fractures. Surgical clips and scarring in the right upper lobe are less apparent. Small pulmonary nodules demonstrated on the prior CT scan are not apparent on this chest x-ray. IMPRESSION: Slight increase in cardiac size since the prior chest x-ray. Persistent small right pleural effusion. Electronically Signed   By: Lorriane Shire M.D.   On: 10/30/2015 10:24        Scheduled Meds: . [facility] Hold] antiseptic oral rinse  7 mL Mouth Rinse q12n4p  . [MAR Hold] apixaban  5 mg Oral BID  . [MAR Hold] aspirin EC  81 mg Oral QHS  . [MAR Hold] chlorhexidine  15 mL Mouth Rinse BID  . [MAR Hold] loratadine  10 mg Oral Daily  . [MAR Hold] metoprolol tartrate  25 mg Oral BID  . [MAR Hold] mometasone-formoterol  2 puff Inhalation BID  . [facility] Hold]  multivitamin with minerals  1 tablet Oral Daily  . [MAR Hold] nicotine  7 mg Transdermal QHS  . [MAR Hold] pregabalin  25 mg Oral BID  . [MAR Hold] sodium chloride flush  3 mL Intravenous Q12H  . [MAR Hold] sodium chloride flush  3 mL Intravenous Q12H  . [MAR Hold] tamsulosin  0.4 mg Oral QHS  . [MAR Hold] tiotropium  18 mcg Inhalation Daily   Continuous Infusions: . sodium chloride 250 mL (10/31/15 0907)  . diltiazem (CARDIZEM) infusion 7.5 mg/hr (10/30/15 2318)     LOS: 1 day    Cordelia Poche, MD [facility] Pager [phone]  If 7PM-7AM, please contact night-coverage [email or web address] Password [email or web address] 10/31/2015, 11:45 AM

## 2015-10-31 NOTE — Anesthesia Preprocedure Evaluation (Signed)
Anesthesia Evaluation  Patient identified by MRN, date of birth, ID band Patient awake    Reviewed: Allergy & Precautions, NPO status , Patient's Chart, lab work & pertinent test results  Airway Mallampati: II  TM Distance: >3 FB Neck ROM: Full    Dental  (+) Edentulous Upper, Edentulous Lower, Dental Advisory Given   Pulmonary COPD, Current Smoker,    breath sounds clear to auscultation       Cardiovascular + Past MI, + Peripheral Vascular Disease and +CHF   Rhythm:Regular Rate:Normal     Neuro/Psych    GI/Hepatic GERD  Medicated and Controlled,  Endo/Other    Renal/GU      Musculoskeletal   Abdominal   Peds  Hematology   Anesthesia Other Findings   Reproductive/Obstetrics                             Anesthesia Physical Anesthesia Plan  ASA: III  Anesthesia Plan: MAC   Post-op Pain Management:    Induction: Intravenous  Airway Management Planned: Simple Face Mask  Additional Equipment:   Intra-op Plan:   Post-operative Plan:   Informed Consent: I have reviewed the patients History and Physical, chart, labs and discussed the procedure including the risks, benefits and alternatives for the proposed anesthesia with the patient or authorized representative who has indicated his/her understanding and acceptance.   Dental advisory given  Plan Discussed with: Anesthesiologist, CRNA and Surgeon  Anesthesia Plan Comments:         Anesthesia Quick Evaluation

## 2015-10-31 NOTE — Interval H&P Note (Signed)
History and Physical Interval Note:  10/31/2015 12:56 PM  [name]  has presented today for surgery, with the diagnosis of Atrial fibrillation  The various methods of treatment have been discussed with the patient and family. After consideration of risks, benefits and other options for treatment, the patient has consented to  Procedure(s): TRANSESOPHAGEAL ECHOCARDIOGRAM (TEE) (N/A) CARDIOVERSION (N/A) as a surgical intervention .  The patient's history has been reviewed, patient examined, no change in status, stable for surgery.  I have reviewed the patient's chart and labs.  Questions were answered to the patient's satisfaction.     [name]

## 2015-11-01 DIAGNOSIS — Z9181 History of falling: Secondary | ICD-10-CM | POA: Diagnosis not present

## 2015-11-01 DIAGNOSIS — I739 Peripheral vascular disease, unspecified: Secondary | ICD-10-CM | POA: Diagnosis not present

## 2015-11-01 DIAGNOSIS — I2510 Atherosclerotic heart disease of native coronary artery without angina pectoris: Secondary | ICD-10-CM | POA: Diagnosis not present

## 2015-11-01 DIAGNOSIS — I4891 Unspecified atrial fibrillation: Secondary | ICD-10-CM | POA: Diagnosis not present

## 2015-11-01 DIAGNOSIS — E7800 Pure hypercholesterolemia, unspecified: Secondary | ICD-10-CM | POA: Diagnosis not present

## 2015-11-01 DIAGNOSIS — I5022 Chronic systolic (congestive) heart failure: Secondary | ICD-10-CM | POA: Diagnosis not present

## 2015-11-01 DIAGNOSIS — I110 Hypertensive heart disease with heart failure: Secondary | ICD-10-CM | POA: Diagnosis not present

## 2015-11-01 DIAGNOSIS — I4892 Unspecified atrial flutter: Secondary | ICD-10-CM | POA: Diagnosis not present

## 2015-11-01 DIAGNOSIS — F17210 Nicotine dependence, cigarettes, uncomplicated: Secondary | ICD-10-CM

## 2015-11-01 DIAGNOSIS — K219 Gastro-esophageal reflux disease without esophagitis: Secondary | ICD-10-CM | POA: Diagnosis not present

## 2015-11-01 DIAGNOSIS — Q211 Atrial septal defect: Secondary | ICD-10-CM | POA: Diagnosis not present

## 2015-11-01 DIAGNOSIS — I10 Essential (primary) hypertension: Secondary | ICD-10-CM | POA: Diagnosis not present

## 2015-11-01 DIAGNOSIS — I255 Ischemic cardiomyopathy: Secondary | ICD-10-CM | POA: Diagnosis not present

## 2015-11-01 DIAGNOSIS — I959 Hypotension, unspecified: Secondary | ICD-10-CM | POA: Diagnosis not present

## 2015-11-01 DIAGNOSIS — J449 Chronic obstructive pulmonary disease, unspecified: Secondary | ICD-10-CM | POA: Diagnosis not present

## 2015-11-01 DIAGNOSIS — I350 Nonrheumatic aortic (valve) stenosis: Secondary | ICD-10-CM | POA: Diagnosis not present

## 2015-11-01 DIAGNOSIS — C3411 Malignant neoplasm of upper lobe, right bronchus or lung: Secondary | ICD-10-CM | POA: Diagnosis not present

## 2015-11-01 DIAGNOSIS — I714 Abdominal aortic aneurysm, without rupture: Secondary | ICD-10-CM | POA: Diagnosis not present

## 2015-11-01 LAB — T4, FREE: Free T4: 1.1 ng/dL (ref 0.61–1.12)

## 2015-11-01 MED ORDER — METOPROLOL TARTRATE 25 MG PO TABS
37.5000 mg | ORAL_TABLET | Freq: Two times a day (BID) | ORAL | Status: DC
  Administered 2015-11-01: 37.5 mg via ORAL
  Filled 2015-11-01: qty 2

## 2015-11-01 MED ORDER — EZETIMIBE 10 MG PO TABS
10.0000 mg | ORAL_TABLET | Freq: Every day | ORAL | Status: DC
  Administered 2015-11-01: 10 mg via ORAL
  Filled 2015-11-01: qty 1

## 2015-11-01 MED ORDER — EZETIMIBE 10 MG PO TABS: 10.0000 mg | ORAL_TABLET | Freq: Every day | ORAL | 0 refills | Status: DC

## 2015-11-01 MED ORDER — METOPROLOL TARTRATE 25 MG PO TABS: 25.0000 mg | ORAL_TABLET | Freq: Two times a day (BID) | ORAL | 1 refills | Status: DC

## 2015-11-01 MED ORDER — APIXABAN 5 MG PO TABS: 5.0000 mg | ORAL_TABLET | Freq: Two times a day (BID) | ORAL | 1 refills | Status: DC

## 2015-11-01 NOTE — Discharge Summary (Signed)
Physician Discharge Summary  [name] GNF:[id number] DOB: 1940-04-17 DOA: 10/30/2015  PCP: Loura Pardon, MD  Admit date: 10/30/2015 Discharge date: 11/01/2015  Admitted From: Home Disposition:  Home  Recommendations for Outpatient Follow-up:  1. Follow up with PCP in 1-2 weeks 2. Please obtain BMP/CBC in one week 3. Recommended to follow-up with Electrophysiology for possible atrial ablation secondary to higher risk of recurrence 4. Consider ACEi for Heart Failure treatment  Home Health: No Equipment/Devices:No  Discharge Condition:Stable CODE STATUS: Full Code Diet recommendation: Heart Healthy  Brief/Interim Summary: Patient has a history of stroke, CAD, MI, hypertension, COPD, non-small cell lung carcinoma that presented to the [facility] emergency department after incidental finding of tachycardia while at his radiation oncologist office. They [place] he was found to have atrial flutter with RVR rate in 150s. Cardizem drip was started, cardiology consultation, patient started on Eliquis. Chads vssc 2 risk score of at least 5  After admission, patient had borderline low blood pressures and cardiology opted to perform a cardioversion. TTE was initially performed, significant for an EF of 35-40% with diffuse hypokinesis. Patient was taken to [facility] for TEE with subsequent cardioversion. Patient's TEE was significant for an EF of 35% no evidence of thrombus. DC Cardioversion was performed with successful conversion to sinus rhythm. Patient was transferred back to [facility] and metoprolol increased to '25mg'$  BID from 12.'5mg'$  BID. With history of hypotension and falls, ACEi was not started inpatient. Eliquis was started on this admission.  Discharge Diagnoses:  Principal Problem:   Atrial flutter with rapid ventricular response (HCC) Active Problems:   Pure hypercholesterolemia   HYPERTENSION, BENIGN   MYOCARDIAL INFARCTION, HX OF   CORONARY ATHEROSCLEROSIS NATIVE  CORONARY ARTERY   PERIPHERAL VASCULAR DISEASE   Chronic obstructive pulmonary disease (HCC)   History of prostate cancer   Nicotine dependence   Atrial flutter (HCC)   Chronic systolic heart failure [facility])    Discharge Instructions  Discharge Instructions    Increase activity slowly    Complete by:  As directed       Medication List    STOP taking these medications   clopidogrel 75 MG tablet Commonly known as:  PLAVIX     TAKE these medications   acetaminophen 325 MG tablet Commonly known as:  TYLENOL Take 650 mg by mouth every 4 (four) hours as needed for mild pain, moderate pain, fever or headache.   albuterol 108 (90 Base) MCG/ACT inhaler Commonly known as:  PROVENTIL HFA;VENTOLIN HFA Inhale 2 puffs into the lungs every 4 (four) hours as needed for wheezing or shortness of breath.   ALPRAZolam 0.5 MG tablet Commonly known as:  XANAX Take 1 tablet (0.5 mg total) by mouth at bedtime as needed. Sleep or anxiety What changed:  how much to take  reasons to take this  additional instructions   apixaban 5 MG Tabs tablet Commonly known as:  ELIQUIS Take 1 tablet (5 mg total) by mouth 2 (two) times daily.   aspirin EC 81 MG tablet Take 81 mg by mouth at bedtime.   ezetimibe 10 MG tablet Commonly known as:  ZETIA Take 1 tablet (10 mg total) by mouth daily.   Fluticasone-Salmeterol 500-50 MCG/DOSE Aepb Commonly known as:  ADVAIR DISKUS Inhale 1 puff into the lungs 2 (two) times daily.   ipratropium-albuterol 0.5-2.5 (3) MG/3ML Soln Commonly known as:  DUONEB INHALE CONTENTS OF 1 VIAL EVERY 4 HOURS AS NEEDED What changed:  how much to take  how  to take this  when to take this  reasons to take this  additional instructions   loratadine 10 MG tablet Commonly known as:  CLARITIN Take 10 mg by mouth daily.   metoprolol tartrate 25 MG tablet Commonly known as:  LOPRESSOR Take 1 tablet (25 mg total) by mouth 2 (two) times daily. What changed:  how much  to take   multivitamin with minerals Tabs tablet Take 1 tablet by mouth daily.   nitroGLYCERIN 0.4 MG SL tablet Commonly known as:  NITROSTAT Place 1 tablet (0.4 mg total) under the tongue every 5 (five) minutes as needed. For chest pain What changed:  reasons to take this  additional instructions   OxyCODONE HCl (Abuse Deter) 5 MG Taba Commonly known as:  OXAYDO Take 1 tablet by mouth 2 (two) times daily as needed (for pain).   pregabalin 25 MG capsule Commonly known as:  LYRICA Take 1 capsule (25 mg total) by mouth 2 (two) times daily.   tamsulosin 0.4 MG Caps capsule Commonly known as:  FLOMAX Take 0.4 mg by mouth at bedtime.   tiotropium 18 MCG inhalation capsule Commonly known as:  SPIRIVA HANDIHALER INHALE 1 CAPSULE VIA [place] What changed:  how much to take  how to take this  when to take this  additional instructions      Follow-up Information    Lauree Chandler, MD. Schedule an appointment as soon as possible for a visit in 1 week(s).   Specialty:  Cardiology Contact information: [address] [place] [address] [phone]        Loura Pardon, MD. Schedule an appointment as soon as possible for a visit in 1 week(s).   Specialties:  Family Medicine, Radiology Contact information: [address] [place] [address] [phone]          Allergies  Allergen Reactions  . Augmentin [Amoxicillin-Pot Clavulanate] Nausea And Vomiting    Diarrhea  . Sulfonamide Derivatives Other (See Comments)    Pt do not remember 8-30 md  . Zantac [Ranitidine Hcl] Diarrhea    Consultations:  [facility]   Procedures/Studies: Dg Ribs Unilateral W/chest Right  Result Date: 10/14/2015 CLINICAL DATA:  Fall. Right lateral rib pain. Syncopal episode in the bathroom at 5:45 a.m. today. Right rib and shoulder pain. EXAM: RIGHT RIBS AND CHEST - 3+ VIEW  COMPARISON:  CT of the chest 07/16/2015. Two-view chest x-ray 05/22/2015. FINDINGS: The heart size is normal. Nodular densities in the right upper lobe are somewhat more conspicuous than on the prior exam. Minimally displaced right eighth, ninth, and tenth rib fractures are noted. There may be fractures involving the eleventh and twelfth ribs is well. A small right pleural effusion is present. There is some pleural air suggesting small pneumothorax. Minimal right basilar airspace disease is noted. Coronary artery stents are evident. IMPRESSION: 1. Acute right eighth, ninth, and tenth rib fractures are minimally displaced. 2. Possible fractures in the eleventh and twelfth ribs as well. 3. Small right pneumothorax. 4. Small right pleural effusion and associated airspace disease. This likely represents a small hemothorax and minimal atelectasis. 5. Coronary artery disease. 6. Slight increased conspicuity of right upper lobe nodular disease. These results were called by telephone at the time of interpretation on 10/14/2015 at 9:46 am to Dr. Harvest Dark , who verbally acknowledged these results. Electronically Signed   By: San Morelle M.D.   On: 10/14/2015  09:46  Ct Chest W Contrast  Result Date: 10/27/2015 CLINICAL DATA:  Right upper lobe non-small cell lung carcinoma diagnosed October 2016 status post radiation therapy, presenting for restaging. Additional remote history of prostate cancer. EXAM: CT CHEST WITH CONTRAST TECHNIQUE: Multidetector CT imaging of the chest was performed during intravenous contrast administration. CONTRAST:  153m ISOVUE-300 IOPAMIDOL (ISOVUE-300) INJECTION 61% COMPARISON:  07/16/2015 chest CT. FINDINGS: Mediastinum/Nodes: Normal heart size. Trace pericardial fluid/thickening, not appreciably changed. Left main, left anterior descending, left circumflex and right coronary atherosclerosis. Atherosclerotic nonaneurysmal thoracic aorta. Stable top-normal caliber main pulmonary  artery (3.2 cm diameter). No central pulmonary emboli. Known small calcified right thyroid lobe nodule is obscured by streak artifact on the current chest CT study. Stable mild circumferential wall thickening in the lower third of the thoracic esophagus. No pathologically enlarged axillary, mediastinal or hilar lymph nodes. Top-normal caliber 0.7 cm left lower paraesophageal node (series 2/image 90), increased slightly from 0.6 cm. Lungs/Pleura: No pneumothorax. New (since 04/15/2015 chest CT) small layering right pleural effusion. No left pleural effusion. Mild centrilobular and paraseptal emphysema. Peripheral right upper lobe cavitary 1.2 x 1.1 cm pulmonary nodule (series 5/ image 41), previously 1.3 x 1.2 cm, not appreciably changed. Three new solid right upper lobe pulmonary nodules, largest 7 mm (series 5/ image 31). Mild-to-moderate compressive atelectasis in the dependent/basilar right lower lobe. Peripheral right lower lobe 6 mm solid pulmonary nodule (series 5/image 70), increased from 4 mm. Four enlarging left upper lobe pulmonary nodules, largest 5 mm (series 5/image 56), increased from 2 mm. Upper abdomen: Probable small hiatal hernia. Partially visualize simple appearing exophytic renal cyst in the posterior upper left kidney measuring at least 7.7 cm. Musculoskeletal: No aggressive appearing focal osseous lesions. Acute posterior right eighth through eleventh rib fractures, mildly displaced in the ninth and tenth ribs. Minimal thoracic spondylosis. IMPRESSION: 1. At least 8 new or enlarging scattered pulmonary nodules in both lungs, largest 7 mm in the right upper lobe, worrisome for pulmonary metastases. 2. Primary cavitary right upper lobe lung malignancy is stable. 3. Top-normal size left lower paraesophageal mediastinal lymph node is increased slightly and could represent a nodal metastasis, recommend attention on follow-up chest CT. 4. New small layering right pleural effusion. 5. Acute posterior  right eighth through eleventh rib fractures. No pneumothorax. 6. Aortic atherosclerosis. Left main and 3 vessel coronary atherosclerosis. 7. Stable nonspecific mild circumferential wall thickening in the lower thoracic esophagus with probable small hiatal hernia. Electronically Signed   By: JIlona SorrelM.D.   On: 10/27/2015 15:41   Dg Chest Port 1 View  Result Date: 10/30/2015 CLINICAL DATA:  Tachycardia. Atrial fibrillation. History of non small cell lung cancer. EXAM: PORTABLE CHEST 1 VIEW COMPARISON:  Chest x-ray dated 10/14/2015 and chest CT dated 10/27/2015 FINDINGS: Heart size is increased since the prior chest x-ray. Pulmonary vascularity is normal. Persistent small right pleural effusion. Healing and old right rib fractures. Surgical clips and scarring in the right upper lobe are less apparent. Small pulmonary nodules demonstrated on the prior CT scan are not apparent on this chest x-ray. IMPRESSION: Slight increase in cardiac size since the prior chest x-ray. Persistent small right pleural effusion. Electronically Signed   By: JLorriane ShireM.D.   On: 10/30/2015 10:24   Dg Abd 2 Views  Result Date: 10/24/2015 CLINICAL DATA:  Right lower quadrant and periumbilical burning sensation. History of lung malignancy, recently hospitalized for rib fractures and a pneumothorax. History of CVA, COPD, coronary artery disease, abdominal aortic aneurysm.  disease. EXAM: ABDOMEN - 2 VIEW COMPARISON:  Images from a PET-CT study of January 13, 2015 FINDINGS: The colonic stool burden is increased diffusely. No small or large bowel obstructive pattern is observed. There are no free extraluminal gas collections. No abnormal soft tissue calcifications are observed. There is mild degenerative change of the lumbar spine with gentle dextrocurvature centered at L3-4. There is volume loss at the right lung base. IMPRESSION: Increase colonic stool burden is consistent with constipation in the appropriate clinical setting.  There is no small or large bowel obstructive pattern. The known 4.2 cm abdominal aortic aneurysm demonstrated on the previous PET-CT study is not clearly evident on today's study. Repeat abdominal and pelvic CT scanning may be useful if the patient's symptoms remain unexplained. Electronically Signed   By: David  Martinique M.D.   On: 10/24/2015 16:46    Echo (10/31/15) Study Conclusions  - Left ventricle: The cavity size was normal. Wall thickness was   increased in a pattern of mild LVH. Systolic function was   moderately reduced. The estimated ejection fraction was in the   range of 35% to 40%. Diffuse hypokinesis. The study is not   technically sufficient to allow evaluation of LV diastolic   function. - Aortic valve: Valve mobility was restricted. There was mild   stenosis. Valve area (VTI): 1.67 cm^2. Valve area (Vmax): 1.87   cm^2. Valve area (Vmean): 1.49 cm^2. - Mitral valve: Calcified annulus. - Left atrium: The atrium was moderately dilated. - Right atrium: The atrium was mildly dilated. - Pulmonary arteries: PA peak pressure: 32 mm Hg (S).   Subjective:   Discharge Exam: Vitals:   11/01/15 0950 11/01/15 1153  BP: (!) 106/51 129/80  Pulse: 81 83  Resp:    Temp:     Vitals:   11/01/15 0435 11/01/15 0807 11/01/15 0950 11/01/15 1153  BP:   (!) 106/51 129/80  Pulse:   81 83  Resp:      Temp:      TempSrc:      SpO2:  98%    Weight: 75.4 kg (166 lb 3.2 oz)     Height:        General: Pt is alert, awake, not in acute distress Cardiovascular: RRR, S1/S2 +, no rubs, no gallops Respiratory: CTA bilaterally, no wheezing, no rhonchi Abdominal: Soft, NT, ND, bowel sounds + Extremities: no edema, no cyanosis    The results of significant diagnostics from this hospitalization (including imaging, microbiology, ancillary and laboratory) are listed below for reference.     Microbiology: Recent Results (from the past 240 hour(s))  MRSA PCR Screening     Status: None    Collection Time: 10/30/15  2:50 PM  Result Value Ref Range Status   MRSA by PCR NEGATIVE NEGATIVE Final    Comment:        The GeneXpert MRSA Assay (FDA approved for NASAL specimens only), is one component of a comprehensive MRSA colonization surveillance program. It is not intended to diagnose MRSA infection nor to guide or monitor treatment for MRSA infections.      Labs: BNP (last 3 results) No results for input(s): BNP in the last 8760 hours. Basic Metabolic Panel:  Recent Labs Lab 10/30/15 1000 10/30/15 1014 10/31/15 0313  NA 131* 131* 132*  K 3.7 3.8 3.6  CL 95* 94* 102  CO2 28  --  25  GLUCOSE 121* 118* 106*  BUN '13 13 11  '$ CREATININE 1.02 1.00 0.88  CALCIUM 8.9  --  8.0*   Liver Function Tests:  Recent Labs Lab 10/30/15 1000  AST 19  ALT 21  ALKPHOS 80  BILITOT 0.7  PROT 6.6  ALBUMIN 3.6   No results for input(s): LIPASE, AMYLASE in the last 168 hours. No results for input(s): AMMONIA in the last 168 hours. CBC:  Recent Labs Lab 10/30/15 1000 10/30/15 1014 10/31/15 0313  WBC 9.6  --  6.8  NEUTROABS 8.1*  --   --   HGB 13.7 14.3 11.5*  HCT 39.4 42.0 33.7*  MCV 93.4  --  93.9  PLT 369  --  307   Cardiac Enzymes: No results for input(s): CKTOTAL, CKMB, CKMBINDEX, TROPONINI in the last 168 hours. BNP: Invalid input(s): POCBNP CBG: No results for input(s): GLUCAP in the last 168 hours. D-Dimer No results for input(s): DDIMER in the last 72 hours. Hgb A1c No results for input(s): HGBA1C in the last 72 hours. Lipid Profile No results for input(s): CHOL, HDL, LDLCALC, TRIG, CHOLHDL, LDLDIRECT in the last 72 hours. Thyroid function studies  Recent Labs  10/30/15 1846  TSH 5.114*   Anemia work up No results for input(s): VITAMINB12, FOLATE, FERRITIN, TIBC, IRON, RETICCTPCT in the last 72 hours. Urinalysis    Component Value Date/Time   COLORURINE YELLOW (A) 10/14/2015 0909   APPEARANCEUR CLEAR (A) 10/14/2015 0909   LABSPEC 1.018  10/14/2015 0909   LABSPEC 1.010 01/24/2009 1143   PHURINE 5.0 10/14/2015 0909   GLUCOSEU NEGATIVE 10/14/2015 0909   HGBUR NEGATIVE 10/14/2015 0909   BILIRUBINUR NEGATIVE 10/14/2015 0909   BILIRUBINUR neg 07/07/2015 1527   BILIRUBINUR Negative 01/24/2009 1143   KETONESUR TRACE (A) 10/14/2015 0909   PROTEINUR NEGATIVE 10/14/2015 0909   UROBILINOGEN 1.0 07/07/2015 1527   UROBILINOGEN 0.2 11/17/2011 0822   NITRITE NEGATIVE 10/14/2015 0909   LEUKOCYTESUR NEGATIVE 10/14/2015 0909   LEUKOCYTESUR Small 01/24/2009 1143   Sepsis Labs Invalid input(s): PROCALCITONIN,  WBC,  LACTICIDVEN Microbiology Recent Results (from the past 240 hour(s))  MRSA PCR Screening     Status: None   Collection Time: 10/30/15  2:50 PM  Result Value Ref Range Status   MRSA by PCR NEGATIVE NEGATIVE Final    Comment:        The GeneXpert MRSA Assay (FDA approved for NASAL specimens only), is one component of a comprehensive MRSA colonization surveillance program. It is not intended to diagnose MRSA infection nor to guide or monitor treatment for MRSA infections.      Time coordinating discharge: Over 30 minutes  SIGNED:   Cordelia Poche, MD  [facility] 11/01/2015, 12:26 PM Pager [phone]  If 7PM-7AM, please contact night-coverage [email or web address] Password [email or web address]

## 2015-11-01 NOTE — Progress Notes (Signed)
Subjective:  Day 1 s/p DC cardioversion for A Flutter  Objective:   Vital Signs : Vitals:   10/31/15 2044 11/01/15 0430 11/01/15 0435 11/01/15 0807  BP: 125/63 114/67    Pulse: 82 81    Resp: 18 16    Temp: 98 F (36.7 C) 98.3 F (36.8 C)    TempSrc: Oral Oral    SpO2: 94% 97%  98%  Weight:   166 lb 3.2 oz (75.4 kg)   Height:        Intake/Output from previous day:  Intake/Output Summary (Last 24 hours) at 11/01/15 0900 Last data filed at 11/01/15 0400  Gross per 24 hour  Intake           531.88 ml  Output              250 ml  Net           281.88 ml    I/O since admission: +2081  Wt Readings from Last 3 Encounters:  11/01/15 166 lb 3.2 oz (75.4 kg)  10/30/15 160 lb 11.2 oz (72.9 kg)  10/24/15 161 lb (73 kg)    Medications: . antiseptic oral rinse  7 mL Mouth Rinse q12n4p  . apixaban  5 mg Oral BID  . aspirin EC  81 mg Oral QHS  . chlorhexidine  15 mL Mouth Rinse BID  . loratadine  10 mg Oral Daily  . metoprolol tartrate  25 mg Oral BID  . mometasone-formoterol  2 puff Inhalation BID  . multivitamin with minerals  1 tablet Oral Daily  . nicotine  7 mg Transdermal QHS  . pregabalin  25 mg Oral BID  . sodium chloride flush  3 mL Intravenous Q12H  . sodium chloride flush  3 mL Intravenous Q12H  . tamsulosin  0.4 mg Oral QHS  . tiotropium  18 mcg Inhalation Daily    . sodium chloride 250 mL (10/31/15 0907)    Physical Exam:   General appearance: alert, cooperative and no distress Neck: no adenopathy, no carotid bruit, no JVD, supple, symmetrical, trachea midline and thyroid not enlarged, symmetric, no tenderness/mass/nodules Lungs: rhonchi Heart: regular rate and rhythm and 1/6 sem Abdomen: soft, non-tender; bowel sounds normal; no masses,  no organomegaly Extremities: no edema, redness or tenderness in the calves or thighs Pulses: 2+ and symmetric Neurologic: Grossly normal   Rate: 82  Rhythm: normal sinus rhythm  10/31/15 post cardioversion    ECG (independently read by me): NSR at 75  Lab Results:   Recent Labs  10/30/15 1000 10/30/15 1014 10/31/15 0313  NA 131* 131* 132*  K 3.7 3.8 3.6  CL 95* 94* 102  CO2 28  --  25  GLUCOSE 121* 118* 106*  BUN 13 13 11   CREATININE 1.02 1.00 0.88  CALCIUM 8.9  --  8.0*    Hepatic Function Latest Ref Rng & Units 10/30/2015 07/22/2015 07/07/2015  Total Protein 6.5 - 8.1 g/dL 6.6 6.8 6.5  Albumin 3.5 - 5.0 g/dL 3.6 4.1 4.0  AST 15 - 41 U/L 19 14 12   ALT 17 - 63 U/L 21 10 8   Alk Phosphatase 38 - 126 U/L 80 44 43  Total Bilirubin 0.3 - 1.2 mg/dL 0.7 0.5 0.4  Bilirubin, Direct 0.0 - 0.3 mg/dL - - -     Recent Labs  10/30/15 1000 10/30/15 1014 10/31/15 0313  WBC 9.6  --  6.8  NEUTROABS 8.1*  --   --   HGB 13.7 14.3  11.5*  HCT 39.4 42.0 33.7*  MCV 93.4  --  93.9  PLT 369  --  307    No results for input(s): TROPONINI in the last 72 hours.  Invalid input(s): CK, MB  Lab Results  Component Value Date   TSH 5.114 (H) 10/30/2015   No results for input(s): HGBA1C in the last 72 hours.   Recent Labs  10/30/15 1000  PROT 6.6  ALBUMIN 3.6  AST 19  ALT 21  ALKPHOS 80  BILITOT 0.7   No results for input(s): INR in the last 72 hours. BNP (last 3 results) No results for input(s): BNP in the last 8760 hours.  ProBNP (last 3 results) No results for input(s): PROBNP in the last 8760 hours.   Lipid Panel     Component Value Date/Time   CHOL 157 10/30/2014 0822   TRIG 56.0 10/30/2014 0822   HDL 44.50 10/30/2014 0822   CHOLHDL 4 10/30/2014 0822   VLDL 11.2 10/30/2014 0822   LDLCALC 102 (H) 10/30/2014 0822      Imaging:  Dg Chest Port 1 View  Result Date: 10/30/2015 CLINICAL DATA:  Tachycardia. Atrial fibrillation. History of non small cell lung cancer. EXAM: PORTABLE CHEST 1 VIEW COMPARISON:  Chest x-ray dated 10/14/2015 and chest CT dated 10/27/2015 FINDINGS: Heart size is increased since the prior chest x-ray. Pulmonary vascularity is normal. Persistent small  right pleural effusion. Healing and old right rib fractures. Surgical clips and scarring in the right upper lobe are less apparent. Small pulmonary nodules demonstrated on the prior CT scan are not apparent on this chest x-ray. IMPRESSION: Slight increase in cardiac size since the prior chest x-ray. Persistent small right pleural effusion. Electronically Signed   By: Lorriane Shire M.D.   On: 10/30/2015 10:24      Assessment/Plan:   Principal Problem:   Atrial flutter with rapid ventricular response (HCC) Active Problems:   Pure hypercholesterolemia   HYPERTENSION, BENIGN   MYOCARDIAL INFARCTION, HX OF   CORONARY ATHEROSCLEROSIS NATIVE CORONARY ARTERY   PERIPHERAL VASCULAR DISEASE   Chronic obstructive pulmonary disease (HCC)   History of prostate cancer   Nicotine dependence   Atrial flutter ([place])   1. New onset Atrial Flutter - seen in the cancer center on 8/10 and noted to be in new onset atrial flutter. Unknown duration as he is asymptomatic with this. HR has been in the 130's - 140's since admission. - TSH 5.114. Will check free T4. Repeat echo pending.  - started on IV Cardizem at time of admission but developed hypotension with SBP in the 60's - 70's. Has received 2L fluid bolus.  This patients CHA2DS2-VASc Score and unadjusted Ischemic Stroke Rate (% per year) is equal to 11.2 % stroke rate/year from a score of 7 (CHF, HTN, Vascular, Age (2), TIA (2)).  - Underwent successful cardioversion yesterday;  Telemetry show sinus rhythm in the 80s. Will increase metoprolol to 37.5 mg bid today. - On eliquis for anticoagulation and ASA 81  Mg. Now off plavix.  2. CAD - s/p multiple PCI to LAD and LCx - denies any recent anginal symptoms.  - continue ASA and BB. Plavix stopped now that he is on Eliquis. Unclear why he is not on statin therapy (was on Pravastatin in the past).   He had developed myalgias with statins;  Will add zetia 10 mg daily.  3. Lung Cancer - currently  undergoing radiation.  - per admitting team.     Ian Sine,  MD, [facility] 11/01/2015, 9:00 AM

## 2015-11-01 NOTE — Progress Notes (Signed)
Patient was A&Ox4, ambulatory without assist at time of DC. Steady gait. He denied c/o of any kind. Discharge instruction reviewed with pt and family while at bedside. Questions, concerns denied.

## 2015-11-02 ENCOUNTER — Encounter (HOSPITAL_COMMUNITY): Payer: Self-pay | Admitting: Cardiology

## 2015-11-02 DIAGNOSIS — I5022 Chronic systolic (congestive) heart failure: Secondary | ICD-10-CM | POA: Diagnosis present

## 2015-11-04 ENCOUNTER — Telehealth: Payer: Self-pay

## 2015-11-04 NOTE — Telephone Encounter (Signed)
2nd attempt for TCM outreach. No contact. Left VM with contact info on mobile phone.

## 2015-11-04 NOTE — Telephone Encounter (Signed)
3rd attempt for TCM outreach. Attempt successful.  Transition Care Management Follow-up Telephone Call    Date discharged? 11/01/2015  How have you been since you were released from the hospital? [place]. Pt reports generalized weakness and rib pain related to fall in July 2017.   Any patient concerns? Pt has been experiencing increased constipation and has been taking Colace OTC. Pt also reports intermittent swelling of both lower limbs. Pt states elevating feet does seem to help. Pt states onset began after discharge from hospital.    Items Reviewed:  Medications reviewed: Yes  Allergies reviewed: Yes  Dietary changes reviewed: Yes  Referrals reviewed: Yes   Functional Questionnaire:  Independent - I Dependent - D    Activities of Daily Living (ADLs):    Personal hygiene - I Dressing - I Eating - I Maintaining continence - I Transferring - I  Independent Activities of Daily Living (iADLs): Basic communication skills - I Transportation - I Meal preparation  - I Shopping - I Housework - I Managing medications - I Managing personal finances - I   Confirmed importance and date/time of follow-up visits scheduled YES  Provider Appointment booked with PCP 11/10/2015 @ 1615  Confirmed with patient if condition begins to worsen call PCP or go to the ER.  Patient was given the office number and encouraged to call back with question or concerns: YES

## 2015-11-04 NOTE — Telephone Encounter (Signed)
1st attempt for TCM outreach. No contact. Residential phone busy.

## 2015-11-10 ENCOUNTER — Encounter: Payer: Self-pay | Admitting: Family Medicine

## 2015-11-10 ENCOUNTER — Ambulatory Visit (INDEPENDENT_AMBULATORY_CARE_PROVIDER_SITE_OTHER)
Admission: RE | Admit: 2015-11-10 | Discharge: 2015-11-10 | Disposition: A | Discharge status: Home / Self Care | Payer: Medicare Other | Source: Ambulatory Visit | Attending: Family Medicine | Admitting: Family Medicine

## 2015-11-10 ENCOUNTER — Ambulatory Visit (INDEPENDENT_AMBULATORY_CARE_PROVIDER_SITE_OTHER): Payer: Medicare Other | Admitting: Family Medicine

## 2015-11-10 VITALS — BP 106/54 | HR 84 | Temp 98.3°F | Ht 69.0 in | Wt 161.8 lb

## 2015-11-10 DIAGNOSIS — R05 Cough: Secondary | ICD-10-CM

## 2015-11-10 DIAGNOSIS — D649 Anemia, unspecified: Secondary | ICD-10-CM

## 2015-11-10 DIAGNOSIS — S2241XD Multiple fractures of ribs, right side, subsequent encounter for fracture with routine healing: Secondary | ICD-10-CM

## 2015-11-10 DIAGNOSIS — R059 Cough, unspecified: Secondary | ICD-10-CM

## 2015-11-10 DIAGNOSIS — I10 Essential (primary) hypertension: Secondary | ICD-10-CM

## 2015-11-10 DIAGNOSIS — Z720 Tobacco use: Secondary | ICD-10-CM

## 2015-11-10 DIAGNOSIS — R5381 Other malaise: Secondary | ICD-10-CM

## 2015-11-10 DIAGNOSIS — S2231XA Fracture of one rib, right side, initial encounter for closed fracture: Secondary | ICD-10-CM

## 2015-11-10 DIAGNOSIS — I4892 Unspecified atrial flutter: Secondary | ICD-10-CM

## 2015-11-10 DIAGNOSIS — S2249XA Multiple fractures of ribs, unspecified side, initial encounter for closed fracture: Secondary | ICD-10-CM | POA: Insufficient documentation

## 2015-11-10 NOTE — Assessment & Plan Note (Signed)
In pt with recent rib fractures and copd-high risk for pneumonia  States more tired the last week  Reassuring pulse ox and exam today cxr now -pending rad review

## 2015-11-10 NOTE — Assessment & Plan Note (Signed)
Disc in detail risks of smoking and possible outcomes including copd, vascular/ heart disease, cancer , respiratory and sinus infections  Pt voices understanding Stressed that he may be able to improve breathing and thus ambulate better if he quits now Does not seem motivated

## 2015-11-10 NOTE — Assessment & Plan Note (Signed)
bp is on the low side of normal now with beta blocker that he needs for rate control (a flutter recently) Disc avoidance of orthostatic change by changing positions slowly Also sitting on toilet to urinate F/u cardiology Friday as planned

## 2015-11-10 NOTE — Assessment & Plan Note (Signed)
In sinus rhythm after cardioversion currently  Some relative hypotension with beta blocker for rate control (also fatigue)  Pt states he will not be able to afford eliquis long term F/u with cardiology on Friday as planned

## 2015-11-10 NOTE — Assessment & Plan Note (Signed)
Ongoing pain - unfortunately high risk for fall with pain med Has tylenol prn Enc deep breaths to prevent atelectasis Rev notes from [facility] cxr today for inc cough w/o hypoxia or fever

## 2015-11-10 NOTE — Assessment & Plan Note (Signed)
Re check from hospital  Likely due to chronic dz and dillutional

## 2015-11-10 NOTE — Assessment & Plan Note (Signed)
After 2 serious hosp for rib fx and a fib Now tired and poor exercise stamina F/u with cardiology Friday If cleared by them (a fib) -then could refer for PT

## 2015-11-10 NOTE — Patient Instructions (Addendum)
Chest xray today -pending reading will contact you tomorrow If you become acutely short of breath or develop a fever- go to the hospital  Follow up with cardiology  I do want their "ok" before physical therapy referral  I prefer you sit down to urinate until you see cardiology (due to blood pressure)  It is ok to get back on your B12  To get your strength back - I want to have you increase boost or other meal supplement to 1 bottle three times per day (in addition to the meals you eat)  Also increase water intake as tolerated  Labs today   Please try hard to quit or cut down smoking - this is keeping your lungs from recovering

## 2015-11-10 NOTE — Progress Notes (Signed)
Subjective:    Patient ID: [name], male    DOB: 1940-05-23, 75 y.o.   MRN: [id number]  HPI [facility]10 to 8/12 2017 () He presented after finding tachycardia while at his rad-onc office, rate in the 150s cardizem drip was started/cardiology consulted/ found a flutter Started on Eliuqis with Chads risk score of 5 or more   After admission = he had decreased bp TEE showed EF of 35% with no thrumbus Had DC cardioversion to SR  On metoprolol 25 bid  ACE was not started due to hx of hypotension and falls  Cardiology f/u was arranged     Chemistry      Component Value Date/Time   NA 132 (L) 10/31/2015 0313   K 3.6 10/31/2015 0313   CL 102 10/31/2015 0313   CO2 25 10/31/2015 0313   BUN 11 10/31/2015 0313   CREATININE 0.88 10/31/2015 0313      Component Value Date/Time   CALCIUM 8.0 (L) 10/31/2015 0313   ALKPHOS 80 10/30/2015 1000   AST 19 10/30/2015 1000   ALT 21 10/30/2015 1000   BILITOT 0.7 10/30/2015 1000     Lab Results  Component Value Date   WBC 6.8 10/31/2015   HGB 11.5 (L) 10/31/2015   HCT 33.7 (L) 10/31/2015   MCV 93.9 10/31/2015   PLT 307 10/31/2015   Lab Results  Component Value Date   TSH 5.114 (H) 10/30/2015  free T4 after this was ok  Lab Results  Component Value Date   CHOL 157 10/30/2014   HDL 44.50 10/30/2014   LDLCALC 102 (H) 10/30/2014   TRIG 56.0 10/30/2014   CHOLHDL 4 10/30/2014   TCM call noted increased constipation and leg swelling since d/c On colace   Of note before this on 7/25 he was in the ED after near syncopal episode and fall while urinating  Sustained multiple R sided rib fx and small hemostat pneumothorax (ribs 8-12) He was transferred to [facility] for further care    BP Readings from Last 3 Encounters:  11/10/15 (!) 106/54  11/01/15 129/80  10/30/15 (!) 118/56   Wt Readings from Last 3 Encounters:  11/10/15 161 lb 12 oz (73.4 kg)  11/01/15 166 lb 3.2 oz (75.4  kg)  10/30/15 160 lb 11.2 oz (72.9 kg)    Most recent cxr  CLINICAL DATA:  Tachycardia. Atrial fibrillation. History of non small cell lung cancer.  EXAM: PORTABLE CHEST 1 VIEW  COMPARISON:  Chest x-ray dated 10/14/2015 and chest CT dated 10/27/2015  FINDINGS: Heart size is increased since the prior chest x-ray. Pulmonary vascularity is normal. Persistent small right pleural effusion. Healing and old right rib fractures. Surgical clips and scarring in the right upper lobe are less apparent. Small pulmonary nodules demonstrated on the prior CT scan are not apparent on this chest x-ray.  IMPRESSION: Slight increase in cardiac size since the prior chest x-ray. Persistent small right pleural effusion.  Generally feels weak Hurts - across where his ribs were broken  Foot is still swelling (mainly the left) No appetite  Constipation is improved   Breathing has not been as good- more sob the past few days  Is coughing and some production - pale green to clear depending  No fever   Not moving around a lot  Because he is too tired - daughter thinks he needs physical therapy   Has not driven yet  No further falls (prev  falls were presumed due to vasovagal cause) - or due to blood pressure medicine   Still smoking (daughter says more than reported)  Smokes 1ppd right now  He has tried patches/chantix    He wants to go back to work  [employer] right after hospital- now too tired   He has been trying to drink ensure -it goes down ok   Patient Active Problem List   Diagnosis Date Noted  . Physical deconditioning 11/10/2015  . Multiple rib fractures 11/10/2015  . Chronic systolic heart failure ([place]) 11/02/2015  . Atrial flutter with rapid ventricular response ([place]) 10/30/2015  . Atrial flutter ([place]) 10/30/2015  . B12 deficiency 07/22/2015  . Anemia 07/15/2015  . Hematuria 07/08/2015  . Unintentional weight loss 07/08/2015  . Orthostatic hypotension 07/08/2015  . COPD  with emphysema ([place]) 05/22/2015  . Cough 05/22/2015  . Tobacco user 05/22/2015  . Mediastinal lymphadenopathy 01/22/2015  . 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 S/P bronchoscopy with biopsy   . Colon cancer screening 11/06/2014  . Encounter for Medicare annual wellness exam 11/06/2014  . Routine general medical examination at a health care facility 11/06/2014  . Lung nodule 06/28/2014  . Carotid stenosis 12/20/2013  . Aftercare following surgery of the circulatory system 12/20/2013  . Aftercare following surgery of the circulatory system, [place] 12/19/2012  . Personal history of colonic polyps 08/28/2012  . Hyperglycemia 08/07/2012  . Pre-operative cardiovascular examination 11/03/2011  . Carotid artery disease ([place]) 05/20/2011  . Grief reaction 05/07/2011  . Ischemic cardiomyopathy 01/13/2011  . Palpitations 11/10/2010  . Occlusion and stenosis of carotid artery without mention of cerebral infarction 04/02/2010  . Anxiety disorder 12/12/2009  . HYPERTENSION, BENIGN 09/29/2008  . History of prostate cancer 09/19/2008  . Hyperlipidemia 02/10/2007  . AMAUROSIS FUGAX 02/10/2007  . MYOCARDIAL INFARCTION, HX OF 02/10/2007  . Coronary atherosclerosis 02/10/2007  . PERIPHERAL VASCULAR DISEASE 02/10/2007  . HEMORRHOIDS 02/10/2007  . Chronic obstructive pulmonary disease ([place]) 02/10/2007  . OSTEOARTHRITIS 02/10/2007  . Nicotine dependence 02/10/2007  . Pure hypercholesterolemia 01/27/2007  . CORONARY ATHEROSCLEROSIS NATIVE CORONARY ARTERY 01/27/2007   Past Medical History:  Diagnosis Date  . AAA (abdominal aortic aneurysm) (HCC)    4.2 cm IR AAA noted on 01/13/15 PET scan  . Anxiety   . CAD (coronary artery disease)    AMI 1997 w/ BMS x 2 LAD, BMS CFX 2002, DES x 2 LAD 2009, DES CFX & OM 2012   . Carotid artery disease ([place])    RICA CEA 1950, LICA CEA 9326 per Dr. [name],   . CHF (congestive heart failure) ([place])    pt denied in 2014  . COPD (chronic obstructive pulmonary  disease) ([place])   . GERD (gastroesophageal reflux disease)    occ  . HTN (hypertension)   . Hydradenitis   . Hyperlipidemia   . Ischemic cardiomyopathy    EF 40-45 percent at cath 2012  . Myocardial infarction ([place])   . Non-small cell lung cancer (NSCLC) ([place]) dx'd 12/2014   SBRT  . Osteoarthritis    pt. denies 01/17/2015  . PAC (premature atrial contraction)   . Peripheral vascular disease ([place])   . Pneumonia   . Prostate cancer ([place]) dx'd approx 2008   xrt throughfoley  . Radiation 02/18/15-02/25/15   right upper lung 54 Gy  . Shortness of breath dyspnea   . Stroke [facility]) 11-24-11   "no feeling in right 5th digit"  . Urinary frequency   . Urinary urgency  Past Surgical History:  Procedure Laterality Date  . CARDIAC CATHETERIZATION    . CARDIOVASCULAR STRESS TEST  12-2006  . CARDIOVERSION N/A 10/31/2015   Procedure: CARDIOVERSION;  Surgeon: Sueanne Margarita, MD;  Location: [facility];  Service: Cardiovascular;  Laterality: N/A;  . CAROTID ENDARTERECTOMY Right 03-2000   CE  . CAROTID ENDARTERECTOMY Left 11-24-11   CE  . COLONOSCOPY  04-2001   polyp  . [place], 2002, 2009, 2012   8 stents total  . [place]   neg  . ENDARTERECTOMY  11/24/2011   Procedure: ENDARTERECTOMY CAROTID;  Surgeon: Rosetta Posner, MD;  Location: [facility];  Service: Vascular;  Laterality: Left;  . EYE SURGERY Bilateral Aug. 17, 2015   Cataract  . FUDUCIAL PLACEMENT N/A 12/25/2014   Procedure: PLACEMENT OF FUDUCIAL;  Surgeon: Collene Gobble, MD;  Location: [place] [facility];  Service: Thoracic;  Laterality: N/A;  . PROSTATE SURGERY     Radiation Tx  X's 40  . TEE WITHOUT CARDIOVERSION N/A 10/31/2015   Procedure: TRANSESOPHAGEAL ECHOCARDIOGRAM (TEE);  Surgeon: Sueanne Margarita, MD;  Location[facility] ENDOSCOPY;  Service: Cardiovascular;  Laterality: N/A;  . VASCULAR SURGERY    . VIDEO BRONCHOSCOPY WITH ENDOBRONCHIAL NAVIGATION N/A 12/25/2014   Procedure: VIDEO BRONCHOSCOPY WITH  ENDOBRONCHIAL NAVIGATION  with Biopsies and Brushings;  Surgeon: Collene Gobble, MD;  Location: [facility];  Service: Thoracic;  Laterality: N/A;  . VIDEO BRONCHOSCOPY WITH ENDOBRONCHIAL ULTRASOUND N/A 01/22/2015   Procedure: VIDEO BRONCHOSCOPY WITH ENDOBRONCHIAL ULTRASOUND with Biopsies;  Surgeon: Collene Gobble, MD;  Location: [facility] OR;  Service: Thoracic;  Laterality: N/A;   Social History  Substance Use Topics  . Smoking status: Current Every Day Smoker    Packs/day: 1.00    Years: 55.00    Types: Cigarettes  . Smokeless tobacco: Never Used     Comment: 1/2 ppd (01/07/15)  . Alcohol use 0.0 oz/week     Comment: occ   Family History  Problem Relation Age of Onset  . Stroke Father   . Heart disease Father   . Heart disease Mother     pacemaker  . Other Brother     benign brain tumor  . Cancer Brother     Eye-behind  . Cancer Sister     Breast  . Liver cancer Brother   . Diabetes Maternal Grandmother    Allergies  Allergen Reactions  . Augmentin [Amoxicillin-Pot Clavulanate] Nausea And Vomiting    Diarrhea  . Sulfonamide Derivatives Other (See Comments)    Pt do not remember 8-30 md  . Zantac [Ranitidine Hcl] Diarrhea   Current Outpatient Prescriptions on File Prior to Visit  Medication Sig Dispense Refill  . acetaminophen (TYLENOL) 325 MG tablet Take 650 mg by mouth every 4 (four) hours as needed for mild pain, moderate pain, fever or headache.     . albuterol (PROVENTIL HFA;VENTOLIN HFA) 108 (90 Base) MCG/ACT inhaler Inhale 2 puffs into the lungs every 4 (four) hours as needed for wheezing or shortness of breath.    . ALPRAZolam (XANAX) 0.5 MG tablet Take 1 tablet (0.5 mg total) by mouth at bedtime as needed. Sleep or anxiety (Patient taking differently: Take 0.25 mg by mouth at bedtime as needed for sleep. Sleep or anxiety) 30 tablet 1  . apixaban (ELIQUIS) 5 MG TABS tablet Take 1 tablet (5 mg total) by mouth 2 (two) times daily. 60 tablet 1  . aspirin EC 81 MG tablet Take 81  mg by mouth at bedtime.     Marland Kitchen ezetimibe (ZETIA) 10 MG tablet Take 1 tablet (10 mg total) by mouth daily. 30 tablet 0  . Fluticasone-Salmeterol (ADVAIR DISKUS) 500-50 MCG/DOSE AEPB Inhale 1 puff into the lungs 2 (two) times daily. 3 each 0  . ipratropium-albuterol (DUONEB) 0.5-2.5 (3) MG/3ML SOLN INHALE CONTENTS OF 1 VIAL EVERY 4 HOURS AS NEEDED (Patient taking differently: Take 3 mLs by nebulization every 4 (four) hours as needed. For shortness of breath/wheezing) 180 mL 3  . loratadine (CLARITIN) 10 MG tablet Take 10 mg by mouth daily.    . metoprolol tartrate (LOPRESSOR) 25 MG tablet Take 1 tablet (25 mg total) by mouth 2 (two) times daily. 60 tablet 1  . Multiple Vitamin (MULTIVITAMIN WITH MINERALS) TABS tablet Take 1 tablet by mouth daily.    . nitroGLYCERIN (NITROSTAT) 0.4 MG SL tablet Place 1 tablet (0.4 mg total) under the tongue every 5 (five) minutes as needed. For chest pain (Patient taking differently: Place 0.4 mg under the tongue every 5 (five) minutes as needed for chest pain. ) 25 tablet 6  . OxyCODONE HCl, Abuse Deter, (OXAYDO) 5 MG TABA Take 1 tablet by mouth 2 (two) times daily as needed (for pain).     . pregabalin (LYRICA) 25 MG capsule Take 1 capsule (25 mg total) by mouth 2 (two) times daily. 20 capsule 0  . Tamsulosin HCl (FLOMAX) 0.4 MG CAPS Take 0.4 mg by mouth at bedtime.     Marland Kitchen tiotropium (SPIRIVA HANDIHALER) 18 MCG inhalation capsule INHALE 1 CAPSULE VIA HANDIHALER ONCE DAILY AT THE SAME TIME EVERY DAY (Patient taking differently: Place 18 mcg into inhaler and inhale daily. ) 30 capsule 5   No current facility-administered medications on file prior to visit.      Review of Systems Review of Systems  Constitutional: Negative for fever, appetite change,  and unexpected weight change. pos for fatigue/malaise and dec motivation  Eyes: Negative for pain and visual disturbance.  Respiratory: pos  for cough and shortness of breath.   Cardiovascular: Negative for cp or  palpitations   pos for cwp from rib fx on R Gastrointestinal: Negative for nausea, diarrhea and constipation.  Genitourinary: Negative for urgency and frequency. neg for dysuria  Skin: Negative for pallor or rash   Neurological: Negative for weakness, light-headedness, numbness and headaches.  Hematological: Negative for adenopathy. Does not bruise/bleed easily.  Psychiatric/Behavioral: Negative for dysphoric mood. The patient is not nervous/anxious.         Objective:   Physical Exam  Constitutional: He appears well-developed and well-nourished. No distress.  Frail appearing elderly male   HENT:  Head: Normocephalic and atraumatic.  Mouth/Throat: Oropharynx is clear and moist.  Eyes: Conjunctivae and EOM are normal. Pupils are equal, round, and reactive to light.  Neck: Normal range of motion. Neck supple. No JVD present. Carotid bruit is not present. No thyromegaly present.  Cardiovascular: Normal rate, regular rhythm, normal heart sounds and intact distal pulses.  Exam reveals no gallop.   Pulmonary/Chest: Effort normal and breath sounds normal. No respiratory distress. He has no wheezes. He has no rales. He exhibits tenderness.  No crackles  Diffusely distant bs Scant wheeze only on forced exp Dec bs in R base  Upper airway sounds heard   Diffuse R cw tenderness w/o skin change or crepitus   Abdominal: Soft. Bowel sounds are normal. He exhibits no distension, no abdominal bruit and no mass. There is no tenderness.  Musculoskeletal: He  exhibits edema.  Trace ankle edema bilat-not pitting   Lymphadenopathy:    He has no cervical adenopathy.  Neurological: He is alert. He has normal reflexes. No cranial nerve deficit. He exhibits normal muscle tone. Coordination normal.  Skin: Skin is warm and dry. No rash noted. No pallor.  Psychiatric: He has a normal mood and affect.          Assessment & Plan:   Problem List Items Addressed This Visit      Cardiovascular and  Mediastinum   HYPERTENSION, BENIGN    bp is on the low side of normal now with beta blocker that he needs for rate control (a flutter recently) Disc avoidance of orthostatic change by changing positions slowly Also sitting on toilet to urinate F/u cardiology Friday as planned       Relevant Orders   CBC with Differential/Platelet   Comprehensive metabolic panel   Atrial flutter (HCC)    In sinus rhythm after cardioversion currently  Some relative hypotension with beta blocker for rate control (also fatigue)  Pt states he will not be able to afford eliquis long term F/u with cardiology on Friday as planned        Musculoskeletal and Integument   Multiple rib fractures    Ongoing pain - unfortunately high risk for fall with pain med Has tylenol prn Enc deep breaths to prevent atelectasis Rev notes from [facility] cxr today for inc cough w/o hypoxia or fever         Other   Tobacco user    Disc in detail risks of smoking and possible outcomes including copd, vascular/ heart disease, cancer , respiratory and sinus infections  Pt voices understanding Stressed that he may be able to improve breathing and thus ambulate better if he quits now Does not seem motivated       Physical deconditioning    After 2 serious hosp for rib fx and a fib Now tired and poor exercise stamina F/u with cardiology Friday If cleared by them (a fib) -then could refer for PT        Cough - Primary    In pt with recent rib fractures and copd-high risk for pneumonia  States more tired the last week  Reassuring pulse ox and exam today cxr now -pending rad review       Relevant Orders   DG Chest 2 View   Anemia    Re check from hospital  Likely due to chronic dz and dillutional        Other Visit Diagnoses    Rib fractures, right, closed, initial encounter

## 2015-11-11 LAB — CBC WITH DIFFERENTIAL/PLATELET
BASOS ABS: 0.1 10*3/uL (ref 0.0–0.1)
BASOS PCT: 1 % (ref 0.0–3.0)
EOS ABS: 0.2 10*3/uL (ref 0.0–0.7)
Eosinophils Relative: 3.2 % (ref 0.0–5.0)
HEMATOCRIT: 39.3 % (ref 39.0–52.0)
Hemoglobin: 13.4 g/dL (ref 13.0–17.0)
LYMPHS PCT: 15.6 % (ref 12.0–46.0)
Lymphs Abs: 1.2 10*3/uL (ref 0.7–4.0)
MCHC: 34 g/dL (ref 30.0–36.0)
MCV: 95.8 fl (ref 78.0–100.0)
MONO ABS: 0.6 10*3/uL (ref 0.1–1.0)
Monocytes Relative: 8.7 % (ref 3.0–12.0)
NEUTROS ABS: 5.3 10*3/uL (ref 1.4–7.7)
NEUTROS PCT: 71.5 % (ref 43.0–77.0)
PLATELETS: 318 10*3/uL (ref 150.0–400.0)
RBC: 4.11 Mil/uL — ABNORMAL LOW (ref 4.22–5.81)
RDW: 14.5 % (ref 11.5–15.5)
WBC: 7.4 10*3/uL (ref 4.0–10.5)

## 2015-11-11 LAB — COMPREHENSIVE METABOLIC PANEL
ALT: 14 U/L (ref 0–53)
AST: 18 U/L (ref 0–37)
Albumin: 3.8 g/dL (ref 3.5–5.2)
Alkaline Phosphatase: 68 U/L (ref 39–117)
BILIRUBIN TOTAL: 0.3 mg/dL (ref 0.2–1.2)
BUN: 15 mg/dL (ref 6–23)
CALCIUM: 9 mg/dL (ref 8.4–10.5)
CHLORIDE: 97 meq/L (ref 96–112)
CO2: 28 meq/L (ref 19–32)
CREATININE: 0.96 mg/dL (ref 0.40–1.50)
GFR: 81.07 mL/min (ref >60.00)
GLUCOSE: 93 mg/dL (ref 70–99)
Potassium: 4.3 mEq/L (ref 3.5–5.1)
SODIUM: 133 meq/L — AB (ref 135–145)
Total Protein: 6.7 g/dL (ref 6.0–8.3)

## 2015-11-13 ENCOUNTER — Encounter: Payer: Self-pay | Admitting: Cardiology

## 2015-11-14 ENCOUNTER — Ambulatory Visit (INDEPENDENT_AMBULATORY_CARE_PROVIDER_SITE_OTHER): Payer: Medicare Other | Admitting: Cardiology

## 2015-11-14 ENCOUNTER — Encounter: Payer: Self-pay | Admitting: Cardiology

## 2015-11-14 VITALS — BP 124/68 | HR 78 | Ht 68.5 in | Wt 163.0 lb

## 2015-11-14 DIAGNOSIS — Z720 Tobacco use: Secondary | ICD-10-CM | POA: Diagnosis not present

## 2015-11-14 DIAGNOSIS — I4892 Unspecified atrial flutter: Secondary | ICD-10-CM | POA: Diagnosis not present

## 2015-11-14 DIAGNOSIS — I2510 Atherosclerotic heart disease of native coronary artery without angina pectoris: Secondary | ICD-10-CM

## 2015-11-14 DIAGNOSIS — I779 Disorder of arteries and arterioles, unspecified: Secondary | ICD-10-CM

## 2015-11-14 DIAGNOSIS — I483 Typical atrial flutter: Secondary | ICD-10-CM

## 2015-11-14 DIAGNOSIS — I739 Peripheral vascular disease, unspecified: Secondary | ICD-10-CM

## 2015-11-14 DIAGNOSIS — E785 Hyperlipidemia, unspecified: Secondary | ICD-10-CM

## 2015-11-14 NOTE — Progress Notes (Signed)
Cardiology Office Note   Date:  11/14/2015   ID:  Antron, Seth 31-Mar-1940, MRN [id number]  PCP:  Loura Pardon, MD  Cardiologist:  Dr. [name]    Chief Complaint  Patient presents with  . Hospitalization Follow-up    no further a flutter      History of Present Illness: [name] is a 75 y.o. male who presents for post hospital for a flutter with RVR.  No chest pain.  On ASA and Plavix.  Placed on IV dilt to control rate  His CHA2DS2VASc sore of 7.  plavix stopped and Eliquis started.   He underwent TEE with no clots and then with DCCV cardioverted to SR.     He has a history of coronary artery disease and abdominal aortic aneurysm. His last coronary intervention was in 2012 at which time he had a drug-eluting stent placed in his circumflex.  He is now on Zetia.  He is undergoing radiation for lung cancer.   His cardiac history is significant for anterior MI in 1997 tx'd with bare metal stent x 2 to the LAD, bare metal stenting to the CFX in 2002 and PCI in 2009 with placement of 2 separate drug eluting stents in the LAD.  Cardiac cath on 04/14/10 with severe stenosis in the distal AV groove Circumflex and the OM. These were both treated with DES. His carotid artery dopplers 12/02/12 in [facility] office showed mild disease in RICA at CEA site and mild disease in the LICA CES site   Today he is doing well no chest pain occ SOB and mild lower ext edema.  He was never aware of irregular HR.  He and daughter have numerous questions.  All answered see below.   Past Medical History:  Diagnosis Date  . AAA (abdominal aortic aneurysm) (HCC)    4.2 cm IR AAA noted on 01/13/15 PET scan  . Anxiety   . CAD (coronary artery disease)    AMI 1997 w/ BMS x 2 LAD, BMS CFX 2002, DES x 2 LAD 2009, DES CFX & OM 2012   . Carotid artery disease ([place])    RICA CEA 2992, LICA CEA 4268 per Dr. [name],   . CHF (congestive heart failure) ([place])    pt denied in 2014  . COPD (chronic obstructive  pulmonary disease) ([place])   . GERD (gastroesophageal reflux disease)    occ  . HTN (hypertension)   . Hydradenitis   . Hyperlipidemia   . Ischemic cardiomyopathy    EF 40-45 percent at cath 2012  . Myocardial infarction ([place])   . Non-small cell lung cancer (NSCLC) ([place]) dx'd 12/2014   SBRT  . Osteoarthritis    pt. denies 01/17/2015  . PAC (premature atrial contraction)   . Peripheral vascular disease ([place])   . Pneumonia   . Prostate cancer ([place]) dx'd approx 2008   xrt throughfoley  . Radiation 02/18/15-02/25/15   right upper lung 54 Gy  . Shortness of breath dyspnea   . Stroke [facility]) 11-24-11   "no feeling in right 5th digit"  . Urinary frequency   . Urinary urgency     Past Surgical History:  Procedure Laterality Date  . CARDIAC CATHETERIZATION    . CARDIOVASCULAR STRESS TEST  12-2006  . CARDIOVERSION N/A 10/31/2015   Procedure: CARDIOVERSION;  Surgeon: Sueanne Margarita, MD;  Location: [place];  Service: Cardiovascular;  Laterality: N/A;  . CAROTID ENDARTERECTOMY Right 03-2000   CE  . CAROTID ENDARTERECTOMY  Left 11-24-11   CE  . COLONOSCOPY  04-2001   polyp  . [place], 2002, 2009, 2012   8 stents total  . [place]   neg  . ENDARTERECTOMY  11/24/2011   Procedure: ENDARTERECTOMY CAROTID;  Surgeon: Rosetta Posner, MD;  Location: [place];  Service: Vascular;  Laterality: Left;  . EYE SURGERY Bilateral Aug. 17, 2015   Cataract  . FUDUCIAL PLACEMENT N/A 12/25/2014   Procedure: PLACEMENT OF FUDUCIAL;  Surgeon: Collene Gobble, MD;  Location: [place];  Service: Thoracic;  Laterality: N/A;  . PROSTATE SURGERY     Radiation Tx  X's 40  . TEE WITHOUT CARDIOVERSION N/A 10/31/2015   Procedure: TRANSESOPHAGEAL ECHOCARDIOGRAM (TEE);  Surgeon: Sueanne Margarita, MD;  [facility] ENDOSCOPY;  Service: Cardiovascular;  Laterality: N/A;  . VASCULAR SURGERY    . VIDEO BRONCHOSCOPY WITH ENDOBRONCHIAL NAVIGATION N/A 12/25/2014   Procedure: VIDEO BRONCHOSCOPY  WITH ENDOBRONCHIAL NAVIGATION  with Biopsies and Brushings;  Surgeon: Collene Gobble, MD;  Location: [place];  Service: Thoracic;  Laterality: N/A;  . VIDEO BRONCHOSCOPY WITH ENDOBRONCHIAL ULTRASOUND N/A 01/22/2015   Procedure: VIDEO BRONCHOSCOPY WITH ENDOBRONCHIAL ULTRASOUND with Biopsies;  Surgeon: Collene Gobble, MD;  Location: [place] OR;  Service: Thoracic;  Laterality: N/A;     Current Outpatient Prescriptions  Medication Sig Dispense Refill  . acetaminophen (TYLENOL) 325 MG tablet Take 650 mg by mouth every 4 (four) hours as needed for mild pain, moderate pain, fever or headache.     . albuterol (PROVENTIL HFA;VENTOLIN HFA) 108 (90 Base) MCG/ACT inhaler Inhale 2 puffs into the lungs every 4 (four) hours as needed for wheezing or shortness of breath.    . ALPRAZolam (XANAX) 0.5 MG tablet Take 0.5 mg by mouth at bedtime as needed for anxiety or sleep.    Marland Kitchen apixaban (ELIQUIS) 5 MG TABS tablet Take 1 tablet (5 mg total) by mouth 2 (two) times daily. 60 tablet 1  . aspirin EC 81 MG tablet Take 81 mg by mouth at bedtime.     Marland Kitchen ezetimibe (ZETIA) 10 MG tablet Take 1 tablet (10 mg total) by mouth daily. 30 tablet 0  . Fluticasone-Salmeterol (ADVAIR DISKUS) 500-50 MCG/DOSE AEPB Inhale 1 puff into the lungs 2 (two) times daily. 3 each 0  . ipratropium-albuterol (DUONEB) 0.5-2.5 (3) MG/3ML SOLN Take 3 mLs by nebulization every 4 (four) hours as needed (shortness of breath and wheezing).    Marland Kitchen loratadine (CLARITIN) 10 MG tablet Take 10 mg by mouth daily.    . metoprolol tartrate (LOPRESSOR) 12.5 mg TABS tablet Take 12.5 mg by mouth 2 (two) times daily.    . Multiple Vitamin (MULTIVITAMIN WITH MINERALS) TABS tablet Take 1 tablet by mouth daily.    . nitroGLYCERIN (NITROSTAT) 0.4 MG SL tablet Place 0.4 mg under the tongue every 5 (five) minutes as needed for chest pain.    . OxyCODONE HCl, Abuse Deter, (OXAYDO) 5 MG TABA Take 1 tablet by mouth 2 (two) times daily as needed (for pain).     . pregabalin (LYRICA)  25 MG capsule Take 1 capsule (25 mg total) by mouth 2 (two) times daily. 20 capsule 0  . Tamsulosin HCl (FLOMAX) 0.4 MG CAPS Take 0.4 mg by mouth at bedtime.     Marland Kitchen tiotropium (SPIRIVA) 18 MCG inhalation capsule Place 18 mcg into inhaler and inhale daily.     No current facility-administered medications for this visit.     Allergies:  Augmentin [amoxicillin-pot clavulanate]; Chantix [varenicline]; Sulfonamide derivatives; and Zantac [ranitidine hcl]    Social History:  The patient  reports that he has been smoking Cigarettes.  He has a 55.00 pack-year smoking history. He has never used smokeless tobacco. He reports that he drinks alcohol. He reports that he does not use drugs.   Family History:  The patient's family history includes Cancer in his brother and sister; Diabetes in his maternal grandmother; Heart disease in his father and mother; Liver cancer in his brother; Other in his brother; Stroke in his father.    ROS:  General:no colds or fevers, no weight changes Skin:no rashes or ulcers HEENT:no blurred vision, no congestion CV:see HPI PUL:see HPI GI:no diarrhea constipation or melena, no indigestion GU:no hematuria, no dysuria MS:no joint pain, no claudication Neuro:no syncope, no lightheadedness Endo:no diabetes, no thyroid disease Wt Readings from Last 3 Encounters:  11/14/15 163 lb (73.9 kg)  11/10/15 161 lb 12 oz (73.4 kg)  11/01/15 166 lb 3.2 oz (75.4 kg)     PHYSICAL EXAM: VS:  BP 124/68   Pulse 78   Ht 5' 8.5" (1.74 m)   Wt 163 lb (73.9 kg)   BMI 24.42 kg/m  , BMI Body mass index is 24.42 kg/m. General:Pleasant affect, NAD Skin:Warm and dry, brisk capillary refill HEENT:normocephalic, sclera clear, mucus membranes moist Neck:supple, no JVD, no bruits  Heart:S1S2 RRR without murmur, gallup, rub or click Lungs:clear without rales, rhonchi, or wheezes SWN:IOEV, non tender, + BS, do not palpate liver spleen or masses Ext:no lower ext edema, 2+ pedal pulses, 2+  radial pulses Neuro:alert and oriented, MAE, follows commands, + facial symmetry    EKG:  EKG is ordered today. The ekg ordered today demonstrates SR no acute changes from previous.     Recent Labs: 10/30/2015: TSH 5.114 11/10/2015: ALT 14; BUN 15; Creatinine, Ser 0.96; Hemoglobin 13.4; Platelets 318.0; Potassium 4.3; Sodium 133    Lipid Panel    Component Value Date/Time   CHOL 157 10/30/2014 0822   TRIG 56.0 10/30/2014 0822   HDL 44.50 10/30/2014 0822   CHOLHDL 4 10/30/2014 0822   VLDL 11.2 10/30/2014 0822   LDLCALC 102 (H) 10/30/2014 0350       Other studies Reviewed: Additional studies/ records that were reviewed today include: . Echo:  In a flutter with RVR 8/11/7 Study Conclusions  - Left ventricle: The cavity size was normal. Wall thickness was   increased in a pattern of mild LVH. Systolic function was   moderately reduced. The estimated ejection fraction was in the   range of 35% to 40%. Diffuse hypokinesis. The study is not   technically sufficient to allow evaluation of LV diastolic   function. - Aortic valve: Valve mobility was restricted. There was mild   stenosis. Valve area (VTI): 1.67 cm^2. Valve area (Vmax): 1.87   cm^2. Valve area (Vmean): 1.49 cm^2. - Mitral valve: Calcified annulus. - Left atrium: The atrium was moderately dilated. - Right atrium: The atrium was mildly dilated. - Pulmonary arteries: PA peak pressure: 32 mm Hg (S).  Impressions:  - Technically difficult; EF difficult to quantitate due to   tachycardia (HR intermittently 150 during the study; patient in   atrial flutter); moderate global reduction in LV function;   biatrial enlargement; calcified aortic valve with reduced cusp   excursion and mild AS by continuity equation; mild TR.  TEE  Study Conclusions  - Left ventricle: Systolic function was moderately reduced. The   estimated ejection fraction  was in the range of 35% to 40%.   Diffuse hypokinesis. - Aorta: There  is moderate to severe atherosclerotic plaque of the   thoracic aorta. - Mitral valve: No evidence of vegetation. There was mild   regurgitation. - Left atrium: The atrium was moderately dilated. No evidence of   thrombus in the atrial cavity or appendage. No evidence of   thrombus in the atrial cavity or appendage. No spontaneous echo   contrast was observed. - Right atrium: The atrium was mildly dilated. No evidence of   thrombus in the atrial cavity or appendage. 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 There is evidence of a PFO by colorflow and agitated   saline contrast injection. - Tricuspid valve: There was mild-moderate regurgitation. - Impressions: Moderate LV dysfunction with diffuse hypokinesis. No   evidence of left atrial or left atrial appendage thrombus. There   is mild AS. Mild to moderate TR. Mild MR. Trivial AR and PR. PFO   present by coloflow and saline contrast study. Moderate to severe   atherosclerotic plaque in the thoracic aorta. Patient went on to   DCCV.  Impressions:  - Moderate LV dysfunction with diffuse hypokinesis. No evidence of   left atrial or left atrial appendage thrombus. There is mild AS.   Mild to moderate TR. Mild MR. Trivial AR and PR. PFO present by   coloflow and saline contrast study. Moderate to severe   atherosclerotic plaque in the thoracic aorta. Patient went on to   DCCV.  DCCV: IMPRESSION:  Successful cardioversion of atrial flutter with RVR to NSR.  Of note, patient had significant snoring during the procedure and should have a sleep study done as an outpt to evaluate for OSA.    Consider referral to EP for possible aflutter ablation given underlying pulmonary issues that will increase risk of recurrent atrial arrhythmias.    ASSESSMENT AND PLAN:  1. PAFl. s/p TEE/DCCV and will need anticoagulation -he is on Eliquis.  But will be $500 for him to continue per month due to donut hole.    Will send to Dr. [name] to see what he would like plan.  Pt is refusing coumadin has heard too many bad things.  Pt maintaining SR.   --may need EP consult for ablation-- will check with Dr. [name]  --+ snoring with TEE may need sleep study- we discussed but for now will hold off.   2. anticoagulation continue    3  CAD:  Stable. No chest pain. He is on good medical therapy. If eliquis is stopped will need to resume plavix with the asa.  Continue statin-on zetia now. and beta blocker.    4. Hypotension he is on 12.5 lopressor BID cannot do higher dose due to low BP, he recently fell and fx a rib so he is anxious back on BB.  May need to see EP for ablation.       5. Carotid artery disease: Stable s/p bilateral CEA. Followed by Dr. [name] in [facility].   6. Tobacco abuse: Smoking cessation encouraged. He is trying to stop smoking.  We disscused not smoking in the house or car.  He cannot take chantix.    7. Hyperlipidemia: pt had stopped his statin so he was discharged on zetia. LDL goal is below 100. He does not tolerate high doses of statins but he was tolerating Pravastatin.    Will check lipids in 4 weeks on Zetia alone  Follow up  with me or Dr. [name] in 2 weeks, I will have answers. By that time as well   Current medicines are reviewed with the patient today.  The patient Has no concerns regarding medicines.  The following changes have been made:  See above Labs/ tests ordered today include:see above  Disposition:   FU:  see above  Signed, Cecilie Kicks, NP  11/14/2015 4:56 PM    [facility] [place], [place], [address], [place] Phone: [phone]; Fax: [phone]

## 2015-11-14 NOTE — Patient Instructions (Signed)
Medication Instructions:  Your physician recommends that you continue on your current medications as directed. Please refer to the Current Medication list given to you today.   Labwork: None ordered  Testing/Procedures: None ordered  Follow-Up: Your physician recommends that you schedule a follow-up appointment in: 2 weeks with Dr. [name] or Cecilie Kicks, NP   Any Other Special Instructions Will Be Listed Below (If Applicable).     If you need a refill on your cardiac medications before your next appointment, please call your pharmacy.

## 2015-11-17 ENCOUNTER — Telehealth: Payer: Self-pay | Admitting: *Deleted

## 2015-11-17 ENCOUNTER — Telehealth: Payer: Self-pay | Admitting: Cardiovascular Disease

## 2015-11-17 ENCOUNTER — Encounter: Payer: Self-pay | Admitting: Family Medicine

## 2015-11-17 DIAGNOSIS — R531 Weakness: Secondary | ICD-10-CM | POA: Insufficient documentation

## 2015-11-17 DIAGNOSIS — S2241XD Multiple fractures of ribs, right side, subsequent encounter for fracture with routine healing: Secondary | ICD-10-CM

## 2015-11-17 NOTE — Telephone Encounter (Signed)
He can start PT. Should be ok from a cardiac perspective. Gerald Stabs

## 2015-11-17 NOTE — Telephone Encounter (Signed)
Please let pt know that cardiology said PT is ok- I want to order that - would he prefer [place] or [place] ?

## 2015-11-17 NOTE — Addendum Note (Signed)
Addended by: Loura Pardon A on: 11/17/2015 02:22 PM   Modules accepted: Orders

## 2015-11-17 NOTE — Telephone Encounter (Signed)
Called pt, per Cecilie Kicks, NP, to let him know that we would try to keep him with Eliquis samples until he is out of his donut whole, but we still recommended he see EP to discuss ablations for atrial flutter.  Pt advised that he is reluctant to do that and he would discuss at his next fu appt 12/03/15.  Pt verbalized understanding.

## 2015-11-17 NOTE — Telephone Encounter (Signed)
New message       Pt states his PCP want him to have physical therapy.  Pt fell and broke several ribs 4 weeks ago.  He needs PT to improve his range of motion.  Calling to see if we have heard from Dr Glori Bickers to get ok to have PT?  Please call

## 2015-11-17 NOTE — Telephone Encounter (Signed)
Done-will route to [facility] 

## 2015-11-17 NOTE — Telephone Encounter (Signed)
Pt does want to try PT, he would prefer to go somewhere in [place]

## 2015-11-17 NOTE — Telephone Encounter (Signed)
Spoke with Mr. Gleghorn.  Dr. Glori Bickers wants to refer patient to physical therapy due to recent rib fractures and his COPD but wants clearance from cardiology first.  Pt was seen by Cecilie Kicks, NP this past Friday but this was not discussed at the visit.  I advised that I am forwarding this to Dr. [name] for review/advisement.  Pt requests that we inform Dr. Glori Bickers of his response.

## 2015-11-19 ENCOUNTER — Observation Stay
Admission: EM | Admit: 2015-11-19 | Discharge: 2015-11-20 | Disposition: A | Discharge status: Home / Self Care | Payer: Medicare Other | Attending: Internal Medicine | Admitting: Internal Medicine

## 2015-11-19 ENCOUNTER — Emergency Department: Payer: Medicare Other

## 2015-11-19 ENCOUNTER — Encounter: Payer: Self-pay | Admitting: Emergency Medicine

## 2015-11-19 DIAGNOSIS — F419 Anxiety disorder, unspecified: Secondary | ICD-10-CM | POA: Insufficient documentation

## 2015-11-19 DIAGNOSIS — Z6823 Body mass index (BMI) 23.0-23.9, adult: Secondary | ICD-10-CM | POA: Insufficient documentation

## 2015-11-19 DIAGNOSIS — Z7951 Long term (current) use of inhaled steroids: Secondary | ICD-10-CM | POA: Insufficient documentation

## 2015-11-19 DIAGNOSIS — W19XXXA Unspecified fall, initial encounter: Secondary | ICD-10-CM | POA: Insufficient documentation

## 2015-11-19 DIAGNOSIS — M1990 Unspecified osteoarthritis, unspecified site: Secondary | ICD-10-CM | POA: Insufficient documentation

## 2015-11-19 DIAGNOSIS — Z9889 Other specified postprocedural states: Secondary | ICD-10-CM | POA: Insufficient documentation

## 2015-11-19 DIAGNOSIS — Z8249 Family history of ischemic heart disease and other diseases of the circulatory system: Secondary | ICD-10-CM | POA: Insufficient documentation

## 2015-11-19 DIAGNOSIS — Z8546 Personal history of malignant neoplasm of prostate: Secondary | ICD-10-CM | POA: Diagnosis not present

## 2015-11-19 DIAGNOSIS — E440 Moderate protein-calorie malnutrition: Secondary | ICD-10-CM | POA: Diagnosis not present

## 2015-11-19 DIAGNOSIS — Z7901 Long term (current) use of anticoagulants: Secondary | ICD-10-CM | POA: Diagnosis not present

## 2015-11-19 DIAGNOSIS — I10 Essential (primary) hypertension: Secondary | ICD-10-CM | POA: Diagnosis not present

## 2015-11-19 DIAGNOSIS — Z823 Family history of stroke: Secondary | ICD-10-CM | POA: Insufficient documentation

## 2015-11-19 DIAGNOSIS — Z808 Family history of malignant neoplasm of other organs or systems: Secondary | ICD-10-CM | POA: Insufficient documentation

## 2015-11-19 DIAGNOSIS — I255 Ischemic cardiomyopathy: Secondary | ICD-10-CM | POA: Insufficient documentation

## 2015-11-19 DIAGNOSIS — I739 Peripheral vascular disease, unspecified: Secondary | ICD-10-CM | POA: Diagnosis not present

## 2015-11-19 DIAGNOSIS — I509 Heart failure, unspecified: Secondary | ICD-10-CM | POA: Insufficient documentation

## 2015-11-19 DIAGNOSIS — I4892 Unspecified atrial flutter: Secondary | ICD-10-CM | POA: Diagnosis not present

## 2015-11-19 DIAGNOSIS — J90 Pleural effusion, not elsewhere classified: Secondary | ICD-10-CM | POA: Diagnosis not present

## 2015-11-19 DIAGNOSIS — Z880 Allergy status to penicillin: Secondary | ICD-10-CM | POA: Insufficient documentation

## 2015-11-19 DIAGNOSIS — R200 Anesthesia of skin: Secondary | ICD-10-CM | POA: Diagnosis not present

## 2015-11-19 DIAGNOSIS — I2510 Atherosclerotic heart disease of native coronary artery without angina pectoris: Secondary | ICD-10-CM | POA: Insufficient documentation

## 2015-11-19 DIAGNOSIS — Z7982 Long term (current) use of aspirin: Secondary | ICD-10-CM | POA: Insufficient documentation

## 2015-11-19 DIAGNOSIS — I252 Old myocardial infarction: Secondary | ICD-10-CM | POA: Insufficient documentation

## 2015-11-19 DIAGNOSIS — K219 Gastro-esophageal reflux disease without esophagitis: Secondary | ICD-10-CM | POA: Diagnosis not present

## 2015-11-19 DIAGNOSIS — I714 Abdominal aortic aneurysm, without rupture: Secondary | ICD-10-CM | POA: Insufficient documentation

## 2015-11-19 DIAGNOSIS — Z8673 Personal history of transient ischemic attack (TIA), and cerebral infarction without residual deficits: Secondary | ICD-10-CM | POA: Insufficient documentation

## 2015-11-19 DIAGNOSIS — G459 Transient cerebral ischemic attack, unspecified: Secondary | ICD-10-CM | POA: Diagnosis not present

## 2015-11-19 DIAGNOSIS — J449 Chronic obstructive pulmonary disease, unspecified: Secondary | ICD-10-CM | POA: Diagnosis not present

## 2015-11-19 DIAGNOSIS — Z800 Family history of malignant neoplasm of digestive organs: Secondary | ICD-10-CM | POA: Insufficient documentation

## 2015-11-19 DIAGNOSIS — Z79899 Other long term (current) drug therapy: Secondary | ICD-10-CM | POA: Diagnosis not present

## 2015-11-19 DIAGNOSIS — S2241XA Multiple fractures of ribs, right side, initial encounter for closed fracture: Secondary | ICD-10-CM | POA: Diagnosis not present

## 2015-11-19 DIAGNOSIS — F17210 Nicotine dependence, cigarettes, uncomplicated: Secondary | ICD-10-CM | POA: Insufficient documentation

## 2015-11-19 DIAGNOSIS — I110 Hypertensive heart disease with heart failure: Secondary | ICD-10-CM | POA: Insufficient documentation

## 2015-11-19 DIAGNOSIS — C3490 Malignant neoplasm of unspecified part of unspecified bronchus or lung: Secondary | ICD-10-CM | POA: Insufficient documentation

## 2015-11-19 DIAGNOSIS — Z833 Family history of diabetes mellitus: Secondary | ICD-10-CM | POA: Insufficient documentation

## 2015-11-19 DIAGNOSIS — Z803 Family history of malignant neoplasm of breast: Secondary | ICD-10-CM | POA: Insufficient documentation

## 2015-11-19 DIAGNOSIS — Z9109 Other allergy status, other than to drugs and biological substances: Secondary | ICD-10-CM | POA: Insufficient documentation

## 2015-11-19 DIAGNOSIS — I491 Atrial premature depolarization: Secondary | ICD-10-CM | POA: Insufficient documentation

## 2015-11-19 DIAGNOSIS — E785 Hyperlipidemia, unspecified: Secondary | ICD-10-CM | POA: Diagnosis not present

## 2015-11-19 DIAGNOSIS — Z85118 Personal history of other malignant neoplasm of bronchus and lung: Secondary | ICD-10-CM | POA: Diagnosis not present

## 2015-11-19 DIAGNOSIS — Z882 Allergy status to sulfonamides status: Secondary | ICD-10-CM | POA: Insufficient documentation

## 2015-11-19 LAB — CBC
HEMATOCRIT: 40.2 % (ref 40.0–52.0)
HEMOGLOBIN: 14.1 g/dL (ref 13.0–18.0)
MCH: 33.3 pg (ref 26.0–34.0)
MCHC: 35.1 g/dL (ref 32.0–36.0)
MCV: 95 fL (ref 80.0–100.0)
Platelets: 304 10*3/uL (ref 150–440)
RBC: 4.23 MIL/uL — ABNORMAL LOW (ref 4.40–5.90)
RDW: 15 % — ABNORMAL HIGH (ref 11.5–14.5)
WBC: 6.7 10*3/uL (ref 3.8–10.6)

## 2015-11-19 LAB — BASIC METABOLIC PANEL
ANION GAP: 10 (ref 5–15)
BUN: 13 mg/dL (ref 6–20)
CALCIUM: 9.4 mg/dL (ref 8.9–10.3)
CO2: 26 mmol/L (ref 22–32)
Chloride: 98 mmol/L — ABNORMAL LOW (ref 101–111)
Creatinine, Ser: 0.82 mg/dL (ref 0.61–1.24)
GFR calc Af Amer: >60 mL/min (ref >60)
GFR calc non Af Amer: >60 mL/min (ref >60)
GLUCOSE: 100 mg/dL — AB (ref 65–99)
POTASSIUM: 4.1 mmol/L (ref 3.5–5.1)
Sodium: 134 mmol/L — ABNORMAL LOW (ref 135–145)

## 2015-11-19 MED ORDER — ASPIRIN 81 MG PO CHEW
324.0000 mg | CHEWABLE_TABLET | Freq: Once | ORAL | Status: AC
  Administered 2015-11-19: 324 mg via ORAL
  Filled 2015-11-19: qty 4

## 2015-11-19 NOTE — ED Triage Notes (Signed)
For the week left arm numbness 2 to 3 times a day, usually while sitting , occasionally in the left buttock, pt has no current  Symptoms. Speaking in complete sentences, no neuro deficit

## 2015-11-19 NOTE — ED Notes (Signed)
Admitting MD at bedside.

## 2015-11-19 NOTE — ED Provider Notes (Signed)
[facility] Emergency Department Provider Note   ____________________________________________   First MD Initiated Contact with Patient 11/19/15 1943     (approximate)  I have reviewed the triage vital signs and the nursing notes.   HISTORY  Chief Complaint Numbness    HPI [name] is a 75 y.o. male with recent history of onset of a flutter. Also has known history of lung cancer with possible new metastases. Also recent fall with multiple rib fractures. Was walking yesterday and noticed left arm numbness and weakness could not control his left arm lasted possibly 10 minutes or less. This had happened several other times since then sometimes when sitting. He is also had some episodes of numbness in his left buttocks. His leg has not given him any trouble however. At present time everything is working normally. He has no present numbness   Past Medical History:  Diagnosis Date  . AAA (abdominal aortic aneurysm) (HCC)    4.2 cm IR AAA noted on 01/13/15 PET scan  . Anxiety   . CAD (coronary artery disease)    AMI 1997 w/ BMS x 2 LAD, BMS CFX 2002, DES x 2 LAD 2009, DES CFX & OM 2012   . Carotid artery disease ([place])    RICA CEA 2993, LICA CEA 7169 per Dr. [name],   . CHF (congestive heart failure) ([place])    pt denied in 2014  . COPD (chronic obstructive pulmonary disease) ([place])   . GERD (gastroesophageal reflux disease)    occ  . HTN (hypertension)   . Hydradenitis   . Hyperlipidemia   . Ischemic cardiomyopathy    EF 40-45 percent at cath 2012  . Myocardial infarction ([place])   . Non-small cell lung cancer (NSCLC) ([place]) dx'd 12/2014   SBRT  . Osteoarthritis    pt. denies 01/17/2015  . PAC (premature atrial contraction)   . Peripheral vascular disease ([place])   . Pneumonia   . Prostate cancer ([place]) dx'd approx 2008   xrt throughfoley  . Radiation 02/18/15-02/25/15   right upper lung 54 Gy  . Shortness of breath dyspnea   . Stroke [facility]) 11-24-11     "no feeling in right 5th digit"  . Urinary frequency   . Urinary urgency     Patient Active Problem List   Diagnosis Date Noted  . Weakness 11/17/2015  . Physical deconditioning 11/10/2015  . Multiple rib fractures 11/10/2015  . Chronic systolic heart failure ([place]) 11/02/2015  . Atrial flutter with rapid ventricular response ([place]) 10/30/2015  . Atrial flutter ([place]) 10/30/2015  . B12 deficiency 07/22/2015  . Anemia 07/15/2015  . Hematuria 07/08/2015  . Unintentional weight loss 07/08/2015  . Orthostatic hypotension 07/08/2015  . 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 Cough 05/22/2015  . Tobacco user 05/22/2015  . Mediastinal lymphadenopathy 01/22/2015  . 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 S/P bronchoscopy with biopsy   . Colon cancer screening 11/06/2014  . Encounter for Medicare annual wellness exam 11/06/2014  . Routine general medical examination at a health care facility 11/06/2014  . Lung nodule 06/28/2014  . Carotid stenosis 12/20/2013  . Aftercare following surgery of the circulatory system 12/20/2013  . Aftercare following surgery of the circulatory system, [place] 12/19/2012  . Personal history of colonic polyps 08/28/2012  . Hyperglycemia 08/07/2012  . Pre-operative cardiovascular examination 11/03/2011  . Carotid artery disease ([place]) 05/20/2011  . Grief reaction 05/07/2011  . Ischemic cardiomyopathy 01/13/2011  . Palpitations 11/10/2010  .  Occlusion and stenosis of carotid artery without mention of cerebral infarction 04/02/2010  . Anxiety disorder 12/12/2009  . HYPERTENSION, BENIGN 09/29/2008  . History of prostate cancer 09/19/2008  . Hyperlipidemia 02/10/2007  . AMAUROSIS FUGAX 02/10/2007  . MYOCARDIAL INFARCTION, HX OF 02/10/2007  . Coronary atherosclerosis 02/10/2007  . PERIPHERAL VASCULAR DISEASE 02/10/2007  . HEMORRHOIDS 02/10/2007  . Chronic obstructive pulmonary disease ([place]) 02/10/2007  . OSTEOARTHRITIS 02/10/2007  . Nicotine  dependence 02/10/2007  . Pure hypercholesterolemia 01/27/2007  . CORONARY ATHEROSCLEROSIS NATIVE CORONARY ARTERY 01/27/2007    Past Surgical History:  Procedure Laterality Date  . CARDIAC CATHETERIZATION    . CARDIOVASCULAR STRESS TEST  12-2006  . CARDIOVERSION N/A 10/31/2015   Procedure: CARDIOVERSION;  Surgeon: Sueanne Margarita, MD;  Location: [place];  Service: Cardiovascular;  Laterality: N/A;  . CAROTID ENDARTERECTOMY Right 03-2000   CE  . CAROTID ENDARTERECTOMY Left 11-24-11   CE  . COLONOSCOPY  04-2001   polyp  . [place], 2002, 2009, 2012   8 stents total  . [place]   neg  . ENDARTERECTOMY  11/24/2011   Procedure: ENDARTERECTOMY CAROTID;  Surgeon: Rosetta Posner, MD;  Location: [place];  Service: Vascular;  Laterality: Left;  . EYE SURGERY Bilateral Aug. 17, 2015   Cataract  . FUDUCIAL PLACEMENT N/A 12/25/2014   Procedure: PLACEMENT OF FUDUCIAL;  Surgeon: Collene Gobble, MD;  Location: [place];  Service: Thoracic;  Laterality: N/A;  . PROSTATE SURGERY     Radiation Tx  X's 40  . TEE WITHOUT CARDIOVERSION N/A 10/31/2015   Procedure: TRANSESOPHAGEAL ECHOCARDIOGRAM (TEE);  Surgeon: Sueanne Margarita, MD;  Location: [facility] ENDOSCOPY;  Service: Cardiovascular;  Laterality: N/A;  . VASCULAR SURGERY    . VIDEO BRONCHOSCOPY WITH ENDOBRONCHIAL NAVIGATION N/A 12/25/2014   Procedure: VIDEO BRONCHOSCOPY WITH ENDOBRONCHIAL NAVIGATION  with Biopsies and Brushings;  Surgeon: Collene Gobble, MD;  Location: [place];  Service: Thoracic;  Laterality: N/A;  . VIDEO BRONCHOSCOPY WITH ENDOBRONCHIAL ULTRASOUND N/A 01/22/2015   Procedure: VIDEO BRONCHOSCOPY WITH ENDOBRONCHIAL ULTRASOUND with Biopsies;  Surgeon: Collene Gobble, MD;  Location: [place];  Service: Thoracic;  Laterality: N/A;    Prior to Admission medications   Medication Sig Start Date End Date Taking? Authorizing Provider  acetaminophen (TYLENOL) 325 MG tablet Take 650 mg by mouth every 4 (four) hours as  needed for mild pain, moderate pain, fever or headache.  10/17/15   Historical Provider, MD  albuterol (PROVENTIL HFA;VENTOLIN HFA) 108 (90 Base) MCG/ACT inhaler Inhale 2 puffs into the lungs every 4 (four) hours as needed for wheezing or shortness of breath.    Historical Provider, MD  ALPRAZolam Duanne Moron) 0.5 MG tablet Take 0.5 mg by mouth at bedtime as needed for anxiety or sleep.    Historical Provider, MD  apixaban (ELIQUIS) 5 MG TABS tablet Take 1 tablet (5 mg total) by mouth 2 (two) times daily. 11/01/15   Mariel Aloe, MD  aspirin EC 81 MG tablet Take 81 mg by mouth at bedtime.     Historical Provider, MD  ezetimibe (ZETIA) 10 MG tablet Take 1 tablet (10 mg total) by mouth daily. 11/01/15   Mariel Aloe, MD  Fluticasone-Salmeterol (ADVAIR DISKUS) 500-50 MCG/DOSE AEPB Inhale 1 puff into the lungs 2 (two) times daily. 06/28/14 04/27/16  Tammy S Parrett, NP  ipratropium-albuterol (DUONEB) 0.5-2.5 (3) MG/3ML SOLN Take 3 mLs by nebulization every 4 (four) hours as needed (shortness of breath and wheezing).  Historical Provider, MD  loratadine (CLARITIN) 10 MG tablet Take 10 mg by mouth daily.    Historical Provider, MD  metoprolol tartrate (LOPRESSOR) 12.5 mg TABS tablet Take 12.5 mg by mouth 2 (two) times daily.    Historical Provider, MD  Multiple Vitamin (MULTIVITAMIN WITH MINERALS) TABS tablet Take 1 tablet by mouth daily.    Historical Provider, MD  nitroGLYCERIN (NITROSTAT) 0.4 MG SL tablet Place 0.4 mg under the tongue every 5 (five) minutes as needed for chest pain.    Historical Provider, MD  OxyCODONE HCl, Abuse Deter, (OXAYDO) 5 MG TABA Take 1 tablet by mouth 2 (two) times daily as needed (for pain).     Historical Provider, MD  pregabalin (LYRICA) 25 MG capsule Take 1 capsule (25 mg total) by mouth 2 (two) times daily. 10/24/15   Pleas Koch, NP  Tamsulosin HCl (FLOMAX) 0.4 MG CAPS Take 0.4 mg by mouth at bedtime.     Historical Provider, MD  tiotropium (SPIRIVA) 18 MCG inhalation  capsule Place 18 mcg into inhaler and inhale daily.    Historical Provider, MD    Allergies Augmentin [amoxicillin-pot clavulanate]; Chantix [varenicline]; Sulfonamide derivatives; and Zantac [ranitidine hcl]  Family History  Problem Relation Age of Onset  . Stroke Father   . Heart disease Father   . Heart disease Mother     pacemaker  . Other Brother     benign brain tumor  . Cancer Brother     Eye-behind  . Cancer Sister     Breast  . Liver cancer Brother   . Diabetes Maternal Grandmother     Social History Social History  Substance Use Topics  . Smoking status: Current Every Day Smoker    Packs/day: 1.00    Years: 55.00    Types: Cigarettes  . Smokeless tobacco: Never Used     Comment: 1/2 ppd (01/07/15)  . Alcohol use 0.0 oz/week     Comment: occ    Review of Systems Constitutional: No fever/chills Eyes: No visual changes. ENT: No sore throat. Cardiovascular: Denies chest pain. Respiratory: Denies shortness of breath. Gastrointestinal: No abdominal pain.  No nausea, no vomiting.  No diarrhea.  No constipation. Genitourinary: Negative for dysuria. Musculoskeletal: Negative for back pain. Skin: Negative for rash.  10-point ROS otherwise negative.  ____________________________________________   PHYSICAL EXAM:  VITAL SIGNS: ED Triage Vitals [11/19/15 1637]  Enc Vitals Group     BP 138/79     Pulse Rate 88     Resp 18     Temp 97.8 F (36.6 C)     Temp Source Oral     SpO2 98 %     Weight      Height '5\' 8"'$  (1.727 m)     Head Circumference      Peak Flow      Pain Score 0     Pain Loc      Pain Edu?      Excl. in [place]?     Constitutional: Alert and oriented. Well appearing and in no acute distress. Eyes: Conjunctivae are normal. PERRL. EOMI. Head: Atraumatic. Nose: No congestion/rhinnorhea. Mouth/Throat: Mucous membranes are moist.  Oropharynx non-erythematous. Neck: No stridor.  Cardiovascular: Normal rate, regular rhythm. Grossly normal  heart sounds.  Good peripheral circulation. Respiratory: Normal respiratory effort.  No retractions. Lungs CTAB. Gastrointestinal: Soft and nontender. No distention. No abdominal bruits. No CVA tenderness. Musculoskeletal: No lower extremity tenderness nor edema.  No joint effusions. Neurologic:  Normal speech and  language. No gross focal neurologic deficits are appreciated. Cranial nerves II through XII are intact cerebellar rapid alternating movements in the hands and finger to nose are normal motor strength 5 over 5 throughout sensation is intact throughout at this point No gait instability. Skin:  Skin is warm, dry and intact. No rash noted. Psychiatric: Mood and affect are normal. Speech and behavior are normal.  ____________________________________________   LABS (all labs ordered are listed, but only abnormal results are displayed)  Labs Reviewed  BASIC METABOLIC PANEL - Abnormal; Notable for the following:       Result Value   Sodium 134 (*)    Chloride 98 (*)    Glucose, Bld 100 (*)    All other components within normal limits  CBC - Abnormal; Notable for the following:    RBC 4.23 (*)    RDW 15.0 (*)    All other components within normal limits  URINALYSIS COMPLETEWITH MICROSCOPIC ([facility] ONLY)  CBG MONITORING, ED   ____________________________________________  EKG  EKG read and interpreted by me shows normal sinus rhythm rate of 85 left axis no acute changes ____________________________________________  RADIOLOGY  Study Result   CLINICAL DATA:  Left arm numbness for 3 days  EXAM: CT HEAD WITHOUT CONTRAST  TECHNIQUE: Contiguous axial images were obtained from the base of the skull through the vertex without intravenous contrast.  COMPARISON:  11/26/2011  FINDINGS: Global atrophy appropriate to age. Minimal chronic ischemic changes in the periventricular white matter. No mass effect, midline shift, or acute hemorrhage.  IMPRESSION: No acute  intracranial pathology.   Electronically Signed   By: Marybelle Killings M.D.   On: 11/19/2015 20:41    PORTABLE CHEST 1 VIEW  COMPARISON:  Chest 11/10/2015.  CT chest 10/27/2015.  FINDINGS: Normal heart size and pulmonary vascularity. Surgical clips in the right upper lung. Right pleural effusion and mild basilar atelectasis similar to previous study. Focal area of nodular scarring demonstrated in the right upper lung corresponding to a cavitary lesions seen on previous chest CT. No change since the prior radiographs. Additional pulmonary nodules demonstrated at CT are not visualized radiographically. Diffuse emphysematous changes in the lungs. No focal consolidation or airspace disease. Mediastinal contours appear intact. Tortuous aorta. Degenerative changes in the shoulders. Old right rib fractures.  IMPRESSION: No acute changes since previous study. Nodular scarring and pleural effusion again demonstrated on the right without change. Emphysematous changes in the lungs.   Electronically Signed   By: Lucienne Capers M.D.   On: 11/19/2015 20:23 ____________________________________________   PROCEDURES  Procedure(s) perfor Procedures  \  ____________________________________________   INITIAL IMPRESSION / ASSESSMENT AND PLAN / ED COURSE  Pertinent labs & imaging results that were available during my care of the patient were reviewed by me and considered in my medical decision making (see chart for details).    Clinical Course     ____________________________________________   FINAL CLINICAL IMPRESSION(S) / ED DIAGNOSES  Final diagnoses:  Transient cerebral ischemia, unspecified transient cerebral ischemia type      NEW MEDICATIONS STARTED DURING THIS VISIT:  New Prescriptions   No medications on file     Note:  This document was prepared using Dragon voice recognition software and may include unintentional dictation errors.    Nena Polio, MD 11/19/15 2121

## 2015-11-19 NOTE — ED Notes (Signed)
Pt was helped to the bedside commode by [employer]. A meal tray was given to the Pt.

## 2015-11-19 NOTE — H&P (Signed)
[place] @ [facility] Admission History and Physical Harvie Bridge, D.O.  ---------------------------------------------------------------------------------------------------------------------   PATIENT NAME: [name] MR#: [id number] DATE OF BIRTH: 01-30-1941 DATE OF ADMISSION: 11/19/2015 PRIMARY CARE PHYSICIAN: Loura Pardon, MD  REQUESTING/REFERRING PHYSICIAN: ED Dr. Cinda Quest  CHIEF COMPLAINT: Chief Complaint  Patient presents with  . Numbness    HISTORY OF PRESENT ILLNESS: [name] is a 75 y.o. male with a known history of Coronary artery disease, recent new onset of atrial flutter, lung cancer with metastases, rib fracture status post fall was in a usual state of health until 2-3 days ago when he describes weakness, numbness and tingling of his left upper extremity. He states that his symptoms have been gradually improving over the last 2 days to this point where his left fifth digit is the only part of his body that is affected. He also reports some left buttock numbness while [place] the mall. Otherwise he denies any focal neurologic deficits such as slurred speech, vision changes, gait instability.  Of note patient's daughter states that 2 nights ago he took double the dose of his evening medications erroneously. Also of note patient has a recent hospital admission for new onset atrial flutter with cardioversion.  Otherwise there has been no change in status. There has been no recent change in medication or diet.  There has been no recent illness, travel or sick contacts.    Patient denies fevers/chills, dizziness, chest pain, shortness of breath, N/V/C/D, abdominal pain, dysuria/frequency, changes in mental status.    PAST MEDICAL HISTORY: Past Medical History:  Diagnosis Date  . AAA (abdominal aortic aneurysm) (HCC)    4.2 cm IR AAA noted on 01/13/15 PET scan  . Anxiety   . CAD (coronary artery disease)    AMI 1997 w/ BMS x 2 LAD, BMS CFX 2002,  DES x 2 LAD 2009, DES CFX & OM 2012   . Carotid artery disease ([place])    RICA CEA 9030, LICA CEA 0923 per Dr. [name],   . CHF (congestive heart failure) ([place])    pt denied in 2014  . COPD (chronic obstructive pulmonary disease) ([place])   . GERD (gastroesophageal reflux disease)    occ  . HTN (hypertension)   . Hydradenitis   . Hyperlipidemia   . Ischemic cardiomyopathy    EF 40-45 percent at cath 2012  . Myocardial infarction ([place])   . Non-small cell lung cancer (NSCLC) ([place]) dx'd 12/2014   SBRT  . Osteoarthritis    pt. denies 01/17/2015  . PAC (premature atrial contraction)   . Peripheral vascular disease ([place])   . Pneumonia   . Prostate cancer ([place]) dx'd approx 2008   xrt throughfoley  . Radiation 02/18/15-02/25/15   right upper lung 54 Gy  . Shortness of breath dyspnea   . Stroke [facility]) 11-24-11   "no feeling in right 5th digit"  . Urinary frequency   . Urinary urgency       PAST SURGICAL HISTORY: Past Surgical History:  Procedure Laterality Date  . CARDIAC CATHETERIZATION    . CARDIOVASCULAR STRESS TEST  12-2006  . CARDIOVERSION N/A 10/31/2015   Procedure: CARDIOVERSION;  Surgeon: Sueanne Margarita, MD;  Location: [place];  Service: Cardiovascular;  Laterality: N/A;  . CAROTID ENDARTERECTOMY Right 03-2000   CE  . CAROTID ENDARTERECTOMY Left 11-24-11   CE  . COLONOSCOPY  04-2001   polyp  . [place], 2002, 2009, 2012   8 stents total  .  Diagonal   neg  . ENDARTERECTOMY  11/24/2011   Procedure: ENDARTERECTOMY CAROTID;  Surgeon: Rosetta Posner, MD;  Location: [place];  Service: Vascular;  Laterality: Left;  . EYE SURGERY Bilateral Aug. 17, 2015   Cataract  . FUDUCIAL PLACEMENT N/A 12/25/2014   Procedure: PLACEMENT OF FUDUCIAL;  Surgeon: Collene Gobble, MD;  Location: [place];  Service: Thoracic;  Laterality: N/A;  . PROSTATE SURGERY     Radiation Tx  X's 40  . TEE WITHOUT CARDIOVERSION N/A 10/31/2015   Procedure: TRANSESOPHAGEAL  ECHOCARDIOGRAM (TEE);  Surgeon: Sueanne Margarita, MD;  Location: [facility] ENDOSCOPY;  Service: Cardiovascular;  Laterality: N/A;  . VASCULAR SURGERY    . VIDEO BRONCHOSCOPY WITH ENDOBRONCHIAL NAVIGATION N/A 12/25/2014   Procedure: VIDEO BRONCHOSCOPY WITH ENDOBRONCHIAL NAVIGATION  with Biopsies and Brushings;  Surgeon: Collene Gobble, MD;  Location: [place];  Service: Thoracic;  Laterality: N/A;  . VIDEO BRONCHOSCOPY WITH ENDOBRONCHIAL ULTRASOUND N/A 01/22/2015   Procedure: VIDEO BRONCHOSCOPY WITH ENDOBRONCHIAL ULTRASOUND with Biopsies;  Surgeon: Collene Gobble, MD;  Location: [place];  Service: Thoracic;  Laterality: N/A;      SOCIAL HISTORY: Social History  Substance Use Topics  . Smoking status: Current Every Day Smoker    Packs/day: 1.00    Years: 55.00    Types: Cigarettes  . Smokeless tobacco: Never Used     Comment: 1/2 ppd (01/07/15)  . Alcohol use 0.0 oz/week     Comment: occ      FAMILY HISTORY: Family History  Problem Relation Age of Onset  . Stroke Father   . Heart disease Father   . Heart disease Mother     pacemaker  . Other Brother     benign brain tumor  . Cancer Brother     Eye-behind  . Cancer Sister     Breast  . Liver cancer Brother   . Diabetes Maternal Grandmother      MEDICATIONS AT HOME: Prior to Admission medications   Medication Sig Start Date End Date Taking? Authorizing Provider  acetaminophen (TYLENOL) 325 MG tablet Take 650 mg by mouth every 4 (four) hours as needed for mild pain, moderate pain, fever or headache.  10/17/15  Yes Historical Provider, MD  albuterol (PROVENTIL HFA;VENTOLIN HFA) 108 (90 Base) MCG/ACT inhaler Inhale 2 puffs into the lungs every 4 (four) hours as needed for wheezing or shortness of breath.   Yes Historical Provider, MD  ALPRAZolam Duanne Moron) 0.5 MG tablet Take 0.5 mg by mouth at bedtime as needed for anxiety or sleep.   Yes Historical Provider, MD  apixaban (ELIQUIS) 5 MG TABS tablet Take 1 tablet (5 mg total) by mouth 2 (two)  times daily. 11/01/15  Yes Mariel Aloe, MD  aspirin EC 81 MG tablet Take 81 mg by mouth at bedtime.    Yes Historical Provider, MD  ezetimibe (ZETIA) 10 MG tablet Take 1 tablet (10 mg total) by mouth daily. 11/01/15  Yes Mariel Aloe, MD  Fluticasone-Salmeterol (ADVAIR DISKUS) 500-50 MCG/DOSE AEPB Inhale 1 puff into the lungs 2 (two) times daily. 06/28/14 04/27/16 Yes Tammy S Parrett, NP  ipratropium-albuterol (DUONEB) 0.5-2.5 (3) MG/3ML SOLN Take 3 mLs by nebulization every 4 (four) hours as needed (shortness of breath and wheezing).   Yes Historical Provider, MD  loratadine (CLARITIN) 10 MG tablet Take 10 mg by mouth daily.   Yes Historical Provider, MD  metoprolol tartrate (LOPRESSOR) 12.5 mg TABS tablet Take 12.5 mg by mouth 2 (  two) times daily.   Yes Historical Provider, MD  Multiple Vitamin (MULTIVITAMIN WITH MINERALS) TABS tablet Take 1 tablet by mouth daily.   Yes Historical Provider, MD  nitroGLYCERIN (NITROSTAT) 0.4 MG SL tablet Place 0.4 mg under the tongue every 5 (five) minutes as needed for chest pain.   Yes Historical Provider, MD  oxyCODONE (OXY IR/ROXICODONE) 5 MG immediate release tablet Take 5 mg by mouth 2 (two) times daily as needed for severe pain.   Yes Historical Provider, MD  pregabalin (LYRICA) 25 MG capsule Take 1 capsule (25 mg total) by mouth 2 (two) times daily. 10/24/15  Yes Pleas Koch, NP  Tamsulosin HCl (FLOMAX) 0.4 MG CAPS Take 0.4 mg by mouth at bedtime.    Yes Historical Provider, MD  tiotropium (SPIRIVA) 18 MCG inhalation capsule Place 18 mcg into inhaler and inhale daily.   Yes Historical Provider, MD      DRUG ALLERGIES: Allergies  Allergen Reactions  . Augmentin [Amoxicillin-Pot Clavulanate] Diarrhea, Nausea And Vomiting and Other (See Comments)    Has patient had a PCN reaction causing immediate rash, facial/tongue/throat swelling, SOB or lightheadedness with hypotension: No Has patient had a PCN reaction causing severe rash involving mucus membranes  or skin necrosis: No Has patient had a PCN reaction that required hospitalization No Has patient had a PCN reaction occurring within the last 10 years: Yes If all of the above answers are "NO", then may proceed with Cephalosporin use.   . Chantix [Varenicline]     Violent nightmares  . Sulfonamide Derivatives Other (See Comments)    Pt do not remember 8-30 md  . Zantac [Ranitidine Hcl] Diarrhea     REVIEW OF SYSTEMS: CONSTITUTIONAL: No fever/chills, fatigue, weakness, weight gain/loss, headache EYES: No blurry or double vision. ENT: No tinnitus, postnasal drip, redness or soreness of the oropharynx. RESPIRATORY: No cough, wheeze, hemoptysis, dyspnea. CARDIOVASCULAR: No chest pain, orthopnea, palpitations, syncope. GASTROINTESTINAL: No nausea, vomiting, constipation, diarrhea, abdominal pain, hematemesis, melena or hematochezia. GENITOURINARY: No dysuria or hematuria. ENDOCRINE: No polyuria or nocturia. No heat or cold intolerance. HEMATOLOGY: No anemia, bruising, bleeding. INTEGUMENTARY: No rashes, ulcers, lesions. MUSCULOSKELETAL: No arthritis, swelling, gout. NEUROLOGIC: No ataxia. No seizure-type activity. Positivenumbness, tingling, weakness of the left upper extremity and left buttock PSYCHIATRIC: No anxiety, depression, insomnia.  PHYSICAL EXAMINATION: VITAL SIGNS: Blood pressure (!) 152/78, pulse 81, temperature 97.8 F (36.6 C), resp. rate 18, height '5\' 8"'$  (1.727 m), SpO2 100 %.  GENERAL: 75 y.o.-year-old white male patient, well-developed, well-nourished lying in the bed in no acute distress.  Pleasant and cooperative.   HEENT: Head atraumatic, normocephalic. Pupils equal, round, reactive to light and accommodation. No scleral icterus. Extraocular muscles intact. Nares are patent. Oropharynx is clear. Mucus membranes moist. NECK: Supple, full range of motion. No JVD, no bruit heard. No thyroid enlargement, no tenderness, no cervical lymphadenopathy. CHEST: Normal breath  sounds bilaterally. No wheezing, rales, rhonchi or crackles. No use of accessory muscles of respiration.  No reproducible chest wall tenderness.  CARDIOVASCULAR: S1, S2 normal. No murmurs, rubs, or gallops. Cap refill <2 seconds. ABDOMEN: Soft, nontender, nondistended. No rebound, guarding, rigidity. Normoactive bowel sounds present in all four quadrants. No organomegaly or mass. EXTREMITIES: Full range of motion. No pedal edema, cyanosis, or clubbing. NEUROLOGIC: Cranial nerves II through XII are grossly intact with no focal sensorimotor deficit. Muscle strength 5/5 in all extremities except left upper extremity which is 3 out of 5. Sensation intact. Cerebellar signs are intact including rapid alternating movements, finger-to-nose.  PSYCHIATRIC: The patient is alert and oriented x 3. Normal affect, mood, thought content. SKIN: Warm, dry, and intact without obvious rash, lesion, or ulcer.  LABORATORY PANEL:  CBC  Recent Labs Lab 11/19/15 1641  WBC 6.7  HGB 14.1  HCT 40.2  PLT 304   ----------------------------------------------------------------------------------------------------------------- Chemistries  Recent Labs Lab 11/19/15 1641  NA 134*  K 4.1  CL 98*  CO2 26  GLUCOSE 100*  BUN 13  CREATININE 0.82  CALCIUM 9.4   ------------------------------------------------------------------------------------------------------------------ Cardiac Enzymes No results for input(s): TROPONINI in the last 168 hours. ------------------------------------------------------------------------------------------------------------------  RADIOLOGY: Ct Head Wo Contrast  Result Date: 11/19/2015 CLINICAL DATA:  Left arm numbness for 3 days EXAM: CT HEAD WITHOUT CONTRAST TECHNIQUE: Contiguous axial images were obtained from the base of the skull through the vertex without intravenous contrast. COMPARISON:  11/26/2011 FINDINGS: Global atrophy appropriate to age. Minimal chronic ischemic changes in  the periventricular white matter. No mass effect, midline shift, or acute hemorrhage. IMPRESSION: No acute intracranial pathology. Electronically Signed   By: Marybelle Killings M.D.   On: 11/19/2015 20:41   Dg Chest Portable 1 View  Result Date: 11/19/2015 CLINICAL DATA:  Left arm numbness 2 to 3 times a day for the past week, mostly while sitting. EXAM: PORTABLE CHEST 1 VIEW COMPARISON:  Chest 11/10/2015.  CT chest 10/27/2015. FINDINGS: Normal heart size and pulmonary vascularity. Surgical clips in the right upper lung. Right pleural effusion and mild basilar atelectasis similar to previous study. Focal area of nodular scarring demonstrated in the right upper lung corresponding to a cavitary lesions seen on previous chest CT. No change since the prior radiographs. Additional pulmonary nodules demonstrated at CT are not visualized radiographically. Diffuse emphysematous changes in the lungs. No focal consolidation or airspace disease. Mediastinal contours appear intact. Tortuous aorta. Degenerative changes in the shoulders. Old right rib fractures. IMPRESSION: No acute changes since previous study. Nodular scarring and pleural effusion again demonstrated on the right without change. Emphysematous changes in the lungs. Electronically Signed   By: Lucienne Capers M.D.   On: 11/19/2015 20:23    EKG: Sinus rhythm at 85 bpm with left axis deviation and other nonspecific ST and T wave changes.   IMPRESSION AND PLAN:  This is a 75 y.o. male with a history of coronary artery disease, recent new onset atrial flutter now being admitted with:  1. TIA rule out CVA -  - Admit telemetry observation for neuro workup including: - Studies: MRA/MRI, Carotids.  Echocardiogram was done on 10/31/2015. Would consider repeat if warranted. However patient is in sinus rhythm and on Eliquis. - Labs: CBC, BMP, Lipids, TFTs, A1C - Nursing: Neurochecks, O2, dysphagia screen, permissive hypertension.  - Consults: Neurology, PT/OT,  S/S consults.  - Meds: Daily aspirin '81mg'$ .   - Fluids: IVNS'@75cc'$ /hr.   - Routine DVT Px: with Lovenox, SCDs, early ambulation  2. Otherwise continue other home medications  Code Status: Full  All the records are reviewed and case discussed with ED provider. Management plans discussed with the patient and/or family who express understanding and agree with plan of care.   TOTAL TIME TAKING CARE OF THIS PATIENT: 60 minutes.   Klye Besecker D.O. on 11/19/2015 at 11:41 PM Between 7am to 6pm - Pager - [phone] After 6pm go to [email or web address] - [employer] [facility] Office [phone] CC: Primary care physician; Loura Pardon, MD     Note: This dictation was prepared with Dragon dictation along with smaller phrase technology. Any transcriptional  errors that result from this process are unintentional.

## 2015-11-19 NOTE — Telephone Encounter (Signed)
PT set up at [facility] for 12/03/15 patient aware.

## 2015-11-19 NOTE — ED Notes (Signed)
Pt  Requested a pillow and it was provided to him. Pt requested to have something to eat, Curly Rim notified the ED MD for authorization.

## 2015-11-19 NOTE — ED Notes (Signed)
Spoke to Dr.Stafford with chief complaint, whether or not to go forward with head CT.  No order for CT at this time

## 2015-11-20 ENCOUNTER — Observation Stay: Payer: Medicare Other

## 2015-11-20 DIAGNOSIS — I10 Essential (primary) hypertension: Secondary | ICD-10-CM | POA: Diagnosis not present

## 2015-11-20 DIAGNOSIS — E440 Moderate protein-calorie malnutrition: Secondary | ICD-10-CM | POA: Insufficient documentation

## 2015-11-20 DIAGNOSIS — G459 Transient cerebral ischemic attack, unspecified: Secondary | ICD-10-CM | POA: Diagnosis not present

## 2015-11-20 DIAGNOSIS — I639 Cerebral infarction, unspecified: Secondary | ICD-10-CM | POA: Diagnosis not present

## 2015-11-20 DIAGNOSIS — I2510 Atherosclerotic heart disease of native coronary artery without angina pectoris: Secondary | ICD-10-CM | POA: Diagnosis not present

## 2015-11-20 DIAGNOSIS — S2241XA Multiple fractures of ribs, right side, initial encounter for closed fracture: Secondary | ICD-10-CM | POA: Diagnosis not present

## 2015-11-20 LAB — LIPID PANEL
Cholesterol: 116 mg/dL (ref 0–200)
HDL: 51 mg/dL (ref >40)
LDL CALC: 52 mg/dL (ref 0–99)
TRIGLYCERIDES: 65 mg/dL (ref <150)
Total CHOL/HDL Ratio: 2.3 RATIO
VLDL: 13 mg/dL (ref 0–40)

## 2015-11-20 LAB — URINALYSIS COMPLETE WITH MICROSCOPIC (ARMC ONLY)
BACTERIA UA: NONE SEEN
Bilirubin Urine: NEGATIVE
GLUCOSE, UA: NEGATIVE mg/dL
Hgb urine dipstick: NEGATIVE
Ketones, ur: NEGATIVE mg/dL
Leukocytes, UA: NEGATIVE
Nitrite: NEGATIVE
Protein, ur: NEGATIVE mg/dL
Specific Gravity, Urine: 1.012 (ref 1.005–1.030)
pH: 7 (ref 5.0–8.0)

## 2015-11-20 LAB — CBC
HEMATOCRIT: 36.4 % — AB (ref 40.0–52.0)
HEMOGLOBIN: 12.8 g/dL — AB (ref 13.0–18.0)
MCH: 33.4 pg (ref 26.0–34.0)
MCHC: 35.1 g/dL (ref 32.0–36.0)
MCV: 95.1 fL (ref 80.0–100.0)
Platelets: 263 10*3/uL (ref 150–440)
RBC: 3.83 MIL/uL — ABNORMAL LOW (ref 4.40–5.90)
RDW: 14.6 % — ABNORMAL HIGH (ref 11.5–14.5)
WBC: 6.1 10*3/uL (ref 3.8–10.6)

## 2015-11-20 LAB — CREATININE, SERUM: Creatinine, Ser: 0.67 mg/dL (ref 0.61–1.24)

## 2015-11-20 LAB — TROPONIN I: Troponin I: <0.03 ng/mL (ref <0.03)

## 2015-11-20 LAB — HEMOGLOBIN A1C: Hgb A1c MFr Bld: 5.8 % (ref 4.0–6.0)

## 2015-11-20 MED ORDER — LORATADINE 10 MG PO TABS
10.0000 mg | ORAL_TABLET | Freq: Every day | ORAL | Status: DC
  Administered 2015-11-20: 10 mg via ORAL
  Filled 2015-11-20: qty 1

## 2015-11-20 MED ORDER — PRAVASTATIN SODIUM 10 MG PO TABS: 10.0000 mg | ORAL_TABLET | Freq: Every day | ORAL | 0 refills | Status: DC

## 2015-11-20 MED ORDER — METOPROLOL TARTRATE 25 MG PO TABS
12.5000 mg | ORAL_TABLET | Freq: Two times a day (BID) | ORAL | Status: DC
  Administered 2015-11-20 (×2): 12.5 mg via ORAL
  Filled 2015-11-20 (×2): qty 1

## 2015-11-20 MED ORDER — ALPRAZOLAM 0.5 MG PO TABS: 0.5000 mg | ORAL_TABLET | Freq: Every evening | ORAL | Status: DC | PRN

## 2015-11-20 MED ORDER — EZETIMIBE 10 MG PO TABS
10.0000 mg | ORAL_TABLET | Freq: Every day | ORAL | Status: DC
  Administered 2015-11-20: 10 mg via ORAL
  Filled 2015-11-20: qty 1

## 2015-11-20 MED ORDER — TIOTROPIUM BROMIDE MONOHYDRATE 18 MCG IN CAPS
18.0000 ug | ORAL_CAPSULE | Freq: Every day | RESPIRATORY_TRACT | Status: DC
  Administered 2015-11-20: 18 ug via RESPIRATORY_TRACT
  Filled 2015-11-20: qty 5

## 2015-11-20 MED ORDER — ADULT MULTIVITAMIN W/MINERALS CH
1.0000 | ORAL_TABLET | Freq: Every day | ORAL | Status: DC
  Administered 2015-11-20: 1 via ORAL
  Filled 2015-11-20: qty 1

## 2015-11-20 MED ORDER — OXYCODONE HCL 5 MG PO TABS: 5.0000 mg | ORAL_TABLET | Freq: Two times a day (BID) | ORAL | Status: DC | PRN

## 2015-11-20 MED ORDER — NICOTINE 21 MG/24HR TD PT24
21.0000 mg | MEDICATED_PATCH | Freq: Every day | TRANSDERMAL | Status: DC
  Administered 2015-11-20: 21 mg via TRANSDERMAL
  Filled 2015-11-20: qty 1

## 2015-11-20 MED ORDER — SODIUM CHLORIDE 0.9 % IV SOLN
INTRAVENOUS | Status: DC
  Administered 2015-11-20: 01:00:00 via INTRAVENOUS

## 2015-11-20 MED ORDER — ACETAMINOPHEN 325 MG PO TABS
650.0000 mg | ORAL_TABLET | ORAL | Status: DC | PRN
  Administered 2015-11-20: 650 mg via ORAL
  Filled 2015-11-20: qty 2

## 2015-11-20 MED ORDER — ASPIRIN EC 81 MG PO TBEC: 81.0000 mg | DELAYED_RELEASE_TABLET | Freq: Every day | ORAL | Status: DC

## 2015-11-20 MED ORDER — TAMSULOSIN HCL 0.4 MG PO CAPS
0.4000 mg | ORAL_CAPSULE | Freq: Every day | ORAL | Status: DC
  Administered 2015-11-20: 0.4 mg via ORAL
  Filled 2015-11-20: qty 1

## 2015-11-20 MED ORDER — ALBUTEROL SULFATE (2.5 MG/3ML) 0.083% IN NEBU: 3.0000 mL | INHALATION_SOLUTION | RESPIRATORY_TRACT | Status: DC | PRN

## 2015-11-20 MED ORDER — STROKE: EARLY STAGES OF RECOVERY BOOK: Freq: Once | Status: DC

## 2015-11-20 MED ORDER — PREGABALIN 25 MG PO CAPS: 25.0000 mg | ORAL_CAPSULE | Freq: Two times a day (BID) | ORAL | Status: DC

## 2015-11-20 MED ORDER — APIXABAN 5 MG PO TABS
5.0000 mg | ORAL_TABLET | Freq: Two times a day (BID) | ORAL | Status: DC
  Administered 2015-11-20 (×2): 5 mg via ORAL
  Filled 2015-11-20 (×2): qty 1

## 2015-11-20 MED ORDER — NITROGLYCERIN 0.4 MG SL SUBL: 0.4000 mg | SUBLINGUAL_TABLET | SUBLINGUAL | Status: DC | PRN

## 2015-11-20 MED ORDER — IPRATROPIUM-ALBUTEROL 0.5-2.5 (3) MG/3ML IN SOLN: 3.0000 mL | RESPIRATORY_TRACT | Status: DC | PRN

## 2015-11-20 MED ORDER — MOMETASONE FURO-FORMOTEROL FUM 200-5 MCG/ACT IN AERO
2.0000 | INHALATION_SPRAY | Freq: Two times a day (BID) | RESPIRATORY_TRACT | Status: DC
  Administered 2015-11-20 (×2): 2 via RESPIRATORY_TRACT
  Filled 2015-11-20: qty 8.8

## 2015-11-20 NOTE — Progress Notes (Signed)
   11/20/15 1233  OT G-codes **NOT FOR INPATIENT CLASS**  Functional Assessment Tool Used Clinical Judgement based on pt. functional status  Functional Limitation Self care  Self Care Current Status (Q7591) CJ  Self Care Goal Status (M3846) CI  Late Entry G-Code entered by Harrel Carina, MS, OTR/L after chart review. Assessment performed and documented by Harrel Carina, OT. Harrel Carina, MS, OTR/L

## 2015-11-20 NOTE — Progress Notes (Signed)
Dr. Darvin Neighbours made aware of pt's claustrophobia, unable to tolerate MRI, orders received to discontinue MRI

## 2015-11-20 NOTE — Progress Notes (Signed)
Initial Nutrition Assessment  DOCUMENTATION CODES:   Non-severe (moderate) malnutrition in context of chronic illness  INTERVENTION:  Magic cup TID with meals, each supplement provides 290 kcal and 9 grams of protein  NUTRITION DIAGNOSIS:   Inadequate oral intake related to poor appetite, cancer and cancer related treatments, acute illness as evidenced by per patient/family report.  GOAL:   Patient will meet greater than or equal to 90% of their needs  MONITOR:   PO intake, I & O's, Labs, Weight trends, Supplement acceptance  REASON FOR ASSESSMENT:   Malnutrition Screening Tool    ASSESSMENT:   [name] is a 75 y.o. male with a known history of Coronary artery disease, recent new onset of atrial flutter, lung cancer with metastases, rib fracture status post fall was in a usual state of health until 2-3 days ago when he describes weakness, numbness and tingling of his left upper extremity  Spoke with [name], daughter at bedside. He is s/p a fall about 1 month ago after which patient has lost most of his desire to eat. Pt does not complain of taste alterations, more-so that "nothing tastes good" especially food he is being served here. Daughter states that he will complain about anything and everything. Pt endorses a 40# wt loss in the last year, likely related to underlying malignancy, and loss of appetite. Denies nausea/vomiting or any issues chewing/swallowing. He also has a hx of TIA. Per chart review pt exhibits a 17#/9.7% severe wt loss in 1 month. Nutrition-Focused physical exam completed. Findings are moderate fat depletion, moderate muscle depletion, and no edema.   Labs and Medications reviewed: MVI, Lopressor  Diet Order:  Diet Heart Room service appropriate? Yes; Fluid consistency: Thin  Skin:  Reviewed, no issues  Last BM:  11/18/2015  Height:   Ht Readings from Last 1 Encounters:  11/19/15 '5\' 8"'$  (1.727 m)    Weight:   Wt Readings from Last 1  Encounters:  11/20/15 157 lb 11.2 oz (71.5 kg)    Ideal Body Weight:  70 kg  BMI:  Body mass index is 23.98 kg/m.  Estimated Nutritional Needs:   Kcal:  2100-2500 calories  Protein:  85-108 gm  Fluid:  >/= 2.1L  EDUCATION NEEDS:   No education needs identified at this time  [name]. Ian Rasnic, MS, RD LDN Inpatient Clinical Dietitian Pager [phone]

## 2015-11-20 NOTE — Evaluation (Signed)
Physical Therapy Evaluation Patient Details Name: [name] MRN: [id number] DOB: 21-Jan-1941 Today's Date: 11/20/2015   History of Present Illness  presented to ER seconadry to L UE weakness, numbness and tingling x3 days; admitted with TIA/CVA.  CT head negative for acute change; unable to tolerate MRI secondary to claustrophobia.  Clinical Impression  Upon evaluation, patient alert and oriented; follows all commands.  Able to accurately verbalize emergency procedures and demonstrates fair/good STM (3/3 immediate recall, 2/3 delayed recall).  L UE with persistent weakness (4-/5) and delayed speed of activation/coordination; mild extinction with bimanual tasks; no drift or formal sensory deficit appreciated.  L LE grossly symmetrical to R LE and WFL.  Able to complete bed mobility indep; sit/stand, basic transfers and gait (220') without assist device, sup/mod indep.  Mild higher level balance deficits evident with mobility, as indicated by BERG 40/56.  Fair/good use of compensatory strategies noted at this time. Would benefit from skilled PT to address above deficits and promote optimal return to PLOF; recommend transition to home with outpatient PT follow up.    Follow Up Recommendations Outpatient PT    Equipment Recommendations       Recommendations for Other Services OT consult     Precautions / Restrictions Precautions Precautions: Fall Restrictions Weight Bearing Restrictions: No      Mobility  Bed Mobility Overal bed mobility: Modified Independent                Transfers Overall transfer level: Modified independent                  Ambulation/Gait Ambulation/Gait assistance: Supervision Ambulation Distance (Feet): 200 Feet Assistive device: None       General Gait Details: reciprocal stepping pattern with decreased L anterior/lateral weight shift throughout; limited trunk rotation and L arm swing with L UE maintained in partial flexion throughout  gait cycle  Stairs            Wheelchair Mobility    Modified Rankin (Stroke Patients Only)       Balance Overall balance assessment: Needs assistance Sitting-balance support: No upper extremity supported;Feet supported Sitting balance-Leahy Scale: Good     Standing balance support: No upper extremity supported Standing balance-Leahy Scale: Fair                   Standardized Balance Assessment Standardized Balance Assessment : Berg Balance Test Berg Balance Test Sit to Stand: Able to stand without using hands and stabilize independently Standing Unsupported: Able to stand safely 2 minutes Sitting with Back Unsupported but Feet Supported on Floor or Stool: Able to sit safely and securely 2 minutes Stand to Sit: Sits safely with minimal use of hands Transfers: Able to transfer safely, minor use of hands Standing Unsupported with Eyes Closed: Able to stand 10 seconds with supervision Standing Ubsupported with Feet Together: Able to place feet together independently and stand for 1 minute with supervision From Standing, Reach Forward with Outstretched Arm: Reaches forward but needs supervision From Standing Position, Pick up Object from Floor: Able to pick up shoe, needs supervision From Standing Position, Turn to Look Behind Over each Shoulder: Looks behind from both sides and weight shifts well Turn 360 Degrees: Able to turn 360 degrees safely but slowly Standing Unsupported, Alternately Place Feet on Step/Stool: Able to complete >2 steps/needs minimal assist Standing Unsupported, One Foot in Front: Able to take small step independently and hold 30 seconds Standing on One Leg: Tries to lift leg/unable to  hold 3 seconds but remains standing independently Total Score: 40         Pertinent Vitals/Pain Pain Assessment: No/denies pain Pain Score: 0-No pain    Home Living Family/patient expects to be discharged to:: Private residence Living Arrangements:  Alone Available Help at Discharge: Family;Available PRN/intermittently Type of Home: House Home Access: Stairs to enter Entrance Stairs-Rails: Can reach both Entrance Stairs-Number of Steps: 4 Home Layout: One level   Additional Comments: Daughter lives within walking distance of patient and provides frequent check in/assist as needed    Prior Function Level of Independence: Independent         Comments: Indep with ADLs, household and community mobility without assist device; + driving.  Does endorse single fall approx 1 month prior to admission with R-sided rib fractures.  Retired Agricultural consultant.     Hand Dominance   Dominant Hand: Right    Extremity/Trunk Assessment   Upper Extremity Assessment: LUE deficits/detail (Right UE WFL, numbness at 5th digit from a previous event.)       LUE Deficits / Details: grossly 4-/5 throughout with decreased speed of activation, delayed coordination.  Sensation grossly intact and symmetrical.  Mild extinction noted with bimanual tasks. No drift evident.   Lower Extremity Assessment: Overall WFL for tasks assessed (strength grossly symmetrical at 4+ to 5/5; sensation, proprioception and coordination intact)         Communication   Communication: No difficulties  Cognition Arousal/Alertness: Awake/alert Behavior During Therapy: WFL for tasks assessed/performed Overall Cognitive Status: Within Functional Limits for tasks assessed (able to problem-solve simple emergency procedures 100%; immediate recall 3/3, delayed recall 2/3 (3/3 with cuing))                      General Comments      Exercises Other Exercises Other Exercises: BERG balance score 40/56, indicative of increased fall risk with functional activities (esp those involving narrowed, tandem of SLS base of support) Other Exercises: Verbally educated patient on functional deficits in higher level balance and L UE--reviewed variety of options for L UE therex patient could  integrate into daily activities in home enviornment to promote continued use of L UE (playing cards, flipping/picking up cards, opening bottles, gross grasp/release).  Patient/daughter voiced understanding.      Assessment/Plan    PT Assessment Patient needs continued PT services  PT Diagnosis Difficulty walking;Generalized weakness   PT Problem List Decreased mobility;Decreased strength;Decreased balance  PT Treatment Interventions DME instruction;Gait training;Stair training;Functional mobility training;Therapeutic activities;Therapeutic exercise;Balance training;Patient/family education   PT Goals (Current goals can be found in the Care Plan section) Acute Rehab PT Goals Patient Stated Goal: to return home PT Goal Formulation: With patient/family Time For Goal Achievement: 12/04/15 Potential to Achieve Goals: Good Additional Goals Additional Goal #1: Improve BERG by 5-7 points for safety/indep with functional mobility.    Frequency 7X/week   Barriers to discharge        Co-evaluation               End of Session Equipment Utilized During Treatment: Gait belt Activity Tolerance: Patient tolerated treatment well Patient left: in bed;with call bell/phone within reach;with bed alarm set Nurse Communication: Mobility status         Time:  -      Charges:         PT G Codes:       Mohid Furuya H. Owens Shark, PT, DPT, NCS 11/20/15, 2:52 PM [phone]

## 2015-11-20 NOTE — Progress Notes (Signed)
Speech Therapy Note: received order, reviewed chart notes. Consulted NSG then pt and family. Pt denied any difficulty swallowing and is currently on a regular diet; tolerates swallowing pills w/ water per NSG. Pt conversed at conversational level w/out deficits noted; pt and family denied any speech-language deficits.  No further skilled ST services indicated as pt appears at his baseline. Pt agreed. NSG to reconsult if any change in status.

## 2015-11-20 NOTE — Discharge Instructions (Addendum)
Cardiac diet  Activity as tolerated.  You need CT cervical spine with contrast as outpatient.

## 2015-11-20 NOTE — Progress Notes (Signed)
Neuro checks within normal limits.  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

## 2015-11-20 NOTE — Evaluation (Signed)
Occupational Therapy Evaluation Patient Details Name: [name] MRN: [id number] DOB: Apr 08, 1940 Today's Date: 11/20/2015    History of Present Illness Pt. is a 75 y.o. male who was admitted for a TIA with Left sided weakness.   Clinical Impression   Pt. Is a 75 y.o male who was admitted to [facility] with a TIA. Pt. Presents with left sided weakness, apraxia, decreased motor control and coordination which hinder his ability to incorporate his LUE into ADL tasks. Pt. Could benefit from skilled OT services for ADL training, neuro-muscular re-ed, UE there. Ex, and pt. Education about home modification, and work simplification strategies. Pt. Plans to return home upon discharge with family assist as needed, and is a good candidate for Outpatient OT services.    Follow Up Recommendations  Outpatient OT    Equipment Recommendations       Recommendations for Other Services PT consult     Precautions / Restrictions Precautions Precautions: None Restrictions Weight Bearing Restrictions: No           Pt. Seen at [facility] for initial assessment.                                          ADL Overall ADL's : Needs assistance/impaired Eating/Feeding: Minimal assistance;Set up   Grooming: Moderate assistance;Set up (With left hand use.)       Lower Body Dressing: Moderate assistance                         General ADL Comments: Pt. education was provided about incorporating the left hand into ADL tasks.     Vision     Perception     Praxis      Pertinent Vitals/Pain Pain Assessment: 0-10 Pain Score: 0-No pain     Hand Dominance Right   Extremity/Trunk Assessment Upper Extremity Assessment Upper Extremity Assessment: LUE deficits/detail (Right UE WFL, numbness at 5th digit from a previous event.) LUE Deficits / Details: SHoulder flexion, abduction 4/5, elbow flexion, extension 4+/5, forearm supination, pronation, wrist extnesion 4/5 LUE  Sensation: decreased proprioception LUE Coordination: decreased fine motor           Communication Communication Communication: No difficulties   Cognition Arousal/Alertness: Awake/alert Behavior During Therapy: WFL for tasks assessed/performed Overall Cognitive Status: Within Functional Limits for tasks assessed                     General Comments       Exercises       Shoulder Instructions      Home Living Family/patient expects to be discharged to:: Private residence Living Arrangements: Alone Available Help at Discharge: Family Type of Home: House Home Access: Stairs to enter Technical brewer of Steps: 4 Entrance Stairs-Rails: Can reach both Home Layout: One level     Bathroom Shower/Tub: Tub/shower unit Shower/tub characteristics: Curtain   Bathroom Accessibility: Yes How Accessible: Accessible via walker            Prior Functioning/Environment Level of Independence: Independent             OT Diagnosis: Generalized weakness;Apraxia   OT Problem List: Decreased strength;Decreased coordination;Decreased safety awareness;Impaired UE functional use;Impaired balance (sitting and/or standing)   OT Treatment/Interventions: Self-care/ADL training;Therapeutic exercise;Therapeutic activities;Patient/family education;Neuromuscular education    OT Goals(Current goals can be found in the care plan section) Acute Rehab  OT Goals Patient Stated Goal: To return home, regain use of his left hand OT Goal Formulation: With patient/family Potential to Achieve Goals: Good  OT Frequency: Min 1X/week   Barriers to D/C:            Co-evaluation              End of Session    Activity Tolerance: Patient tolerated treatment well Patient left: in bed;with call bell/phone within reach;with bed alarm set;with family/visitor present   Time: 8478-4128 OT Time Calculation (min): 30 min Charges:  OT General Charges $OT Visit: 1 Procedure OT  Evaluation $OT Eval Moderate Complexity: 1 Procedure G-Codes:    Harrel Carina, MS, OTR/L 11/20/2015, 12:47 PM

## 2015-11-20 NOTE — Progress Notes (Signed)
To radiology via bed

## 2015-11-20 NOTE — Progress Notes (Signed)
Back from radiology.

## 2015-11-20 NOTE — Plan of Care (Signed)
Problem: Pain Managment: Goal: General experience of comfort will improve Outcome: Progressing Prn medications  Problem: Physical Regulation: Goal: Ability to maintain clinical measurements within normal limits will improve Outcome: Progressing PT/OT/ST evaluations pending  Problem: Tissue Perfusion: Goal: Risk factors for ineffective tissue perfusion will decrease Outcome: Progressing Eliquis  Problem: Education: Goal: Knowledge of disease or condition will improve Outcome: Progressing Recovering from stroke booklet given

## 2015-11-20 NOTE — Consult Note (Addendum)
Referring Physician: Sudini    Chief Complaint: Left arm weakness and numbness  HPI: [name] is an 75 y.o. male with a known history of coronary artery disease, recent new onset of atrial flutter, lung cancer with metastases who was in a usual state of health until Tuesday when he describes the acute onset of weakness, numbness and tingling of his left upper extremity while walking. He states that his symptoms have been gradually improving over the last 2 days.  At one point only his fifth digit and thumb were numb but now that has resolved as well.   Does report that over the weekend while ambulating had some paresthesias in his left buttock that resolved spontaneously.  Patient has been recently started on Eliquis.    Date last known well: Date: 11/18/2015 Time last known well: Time: 18:00 tPA Given: No: Outside time window  Past Medical History:  Diagnosis Date  . AAA (abdominal aortic aneurysm) (HCC)    4.2 cm IR AAA noted on 01/13/15 PET scan  . Anxiety   . CAD (coronary artery disease)    AMI 1997 w/ BMS x 2 LAD, BMS CFX 2002, DES x 2 LAD 2009, DES CFX & OM 2012   . Carotid artery disease ([place])    RICA CEA 8119, LICA CEA 1478 per Dr. [name],   . CHF (congestive heart failure) ([place])    pt denied in 2014  . COPD (chronic obstructive pulmonary disease) ([place])   . GERD (gastroesophageal reflux disease)    occ  . HTN (hypertension)   . Hydradenitis   . Hyperlipidemia   . Ischemic cardiomyopathy    EF 40-45 percent at cath 2012  . Myocardial infarction ([place])   . Non-small cell lung cancer (NSCLC) ([place]) dx'd 12/2014   SBRT  . Osteoarthritis    pt. denies 01/17/2015  . PAC (premature atrial contraction)   . Peripheral vascular disease ([place])   . Pneumonia   . Prostate cancer ([place]) dx'd approx 2008   xrt throughfoley  . Radiation 02/18/15-02/25/15   right upper lung 54 Gy  . Shortness of breath dyspnea   . Stroke [facility]) 11-24-11   "no feeling in right 5th digit"  . Urinary  frequency   . Urinary urgency     Past Surgical History:  Procedure Laterality Date  . CARDIAC CATHETERIZATION    . CARDIOVASCULAR STRESS TEST  12-2006  . CARDIOVERSION N/A 10/31/2015   Procedure: CARDIOVERSION;  Surgeon: Sueanne Margarita, MD;  Location: [place];  Service: Cardiovascular;  Laterality: N/A;  . CAROTID ENDARTERECTOMY Right 03-2000   CE  . CAROTID ENDARTERECTOMY Left 11-24-11   CE  . COLONOSCOPY  04-2001   polyp  . [place], 2002, 2009, 2012   8 stents total  . [place]   neg  . ENDARTERECTOMY  11/24/2011   Procedure: ENDARTERECTOMY CAROTID;  Surgeon: Rosetta Posner, MD;  Location: [place];  Service: Vascular;  Laterality: Left;  . EYE SURGERY Bilateral Aug. 17, 2015   Cataract  . FUDUCIAL PLACEMENT N/A 12/25/2014   Procedure: PLACEMENT OF FUDUCIAL;  Surgeon: Collene Gobble, MD;  Location: [place];  Service: Thoracic;  Laterality: N/A;  . PROSTATE SURGERY     Radiation Tx  X's 40  . TEE WITHOUT CARDIOVERSION N/A 10/31/2015   Procedure: TRANSESOPHAGEAL ECHOCARDIOGRAM (TEE);  Surgeon: Sueanne Margarita, MD;  [facility]COPY;  Service: Cardiovascular;  Laterality: N/A;  . VASCULAR SURGERY    .  VIDEO BRONCHOSCOPY WITH ENDOBRONCHIAL NAVIGATION N/A 12/25/2014   Procedure: VIDEO BRONCHOSCOPY WITH ENDOBRONCHIAL NAVIGATION  with Biopsies and Brushings;  Surgeon: Collene Gobble, MD;  Location: [place];  Service: Thoracic;  Laterality: N/A;  . VIDEO BRONCHOSCOPY WITH ENDOBRONCHIAL ULTRASOUND N/A 01/22/2015   Procedure: VIDEO BRONCHOSCOPY WITH ENDOBRONCHIAL ULTRASOUND with Biopsies;  Surgeon: Collene Gobble, MD;  Location: [facility] OR;  Service: Thoracic;  Laterality: N/A;    Family History  Problem Relation Age of Onset  . Stroke Father   . Heart disease Father   . Heart disease Mother     pacemaker  . Other Brother     benign brain tumor  . Cancer Brother     Eye-behind  . Cancer Sister     Breast  . Liver cancer Brother   . Diabetes  Maternal Grandmother    Social History:  reports that he has been smoking Cigarettes.  He has a 55.00 pack-year smoking history. He has never used smokeless tobacco. He reports that he drinks alcohol. He reports that he does not use drugs.  Allergies:  Allergies  Allergen Reactions  . Augmentin [Amoxicillin-Pot Clavulanate] Diarrhea, Nausea And Vomiting and Other (See Comments)    Has patient had a PCN reaction causing immediate rash, facial/tongue/throat swelling, SOB or lightheadedness with hypotension: No Has patient had a PCN reaction causing severe rash involving mucus membranes or skin necrosis: No Has patient had a PCN reaction that required hospitalization No Has patient had a PCN reaction occurring within the last 10 years: Yes If all of the above answers are "NO", then may proceed with Cephalosporin use.   . Chantix [Varenicline]     Violent nightmares  . Sulfonamide Derivatives Other (See Comments)    Pt do not remember 8-30 md  . Zantac [Ranitidine Hcl] Diarrhea    Medications:  I have reviewed the patient's current medications. Prior to Admission:  Prescriptions Prior to Admission  Medication Sig Dispense Refill Last Dose  . acetaminophen (TYLENOL) 325 MG tablet Take 650 mg by mouth every 4 (four) hours as needed for mild pain, moderate pain, fever or headache.    prn at prn  . albuterol (PROVENTIL HFA;VENTOLIN HFA) 108 (90 Base) MCG/ACT inhaler Inhale 2 puffs into the lungs every 4 (four) hours as needed for wheezing or shortness of breath.   prn at prn  . ALPRAZolam (XANAX) 0.5 MG tablet Take 0.5 mg by mouth at bedtime as needed for anxiety or sleep.   prn at prn  . apixaban (ELIQUIS) 5 MG TABS tablet Take 1 tablet (5 mg total) by mouth 2 (two) times daily. 60 tablet 1 11/19/2015 at Unknown time  . aspirin EC 81 MG tablet Take 81 mg by mouth at bedtime.    11/18/2015 at Unknown time  . ezetimibe (ZETIA) 10 MG tablet Take 1 tablet (10 mg total) by mouth daily. 30 tablet 0  11/19/2015 at Unknown time  . Fluticasone-Salmeterol (ADVAIR DISKUS) 500-50 MCG/DOSE AEPB Inhale 1 puff into the lungs 2 (two) times daily. 3 each 0 11/19/2015 at Unknown time  . ipratropium-albuterol (DUONEB) 0.5-2.5 (3) MG/3ML SOLN Take 3 mLs by nebulization every 4 (four) hours as needed (shortness of breath and wheezing).   prn at prn  . loratadine (CLARITIN) 10 MG tablet Take 10 mg by mouth daily.   11/19/2015 at Unknown time  . metoprolol tartrate (LOPRESSOR) 12.5 mg TABS tablet Take 12.5 mg by mouth 2 (two) times daily.   11/19/2015 at 0900  . Multiple  Vitamin (MULTIVITAMIN WITH MINERALS) TABS tablet Take 1 tablet by mouth daily.   11/19/2015 at Unknown time  . nitroGLYCERIN (NITROSTAT) 0.4 MG SL tablet Place 0.4 mg under the tongue every 5 (five) minutes as needed for chest pain.   prn at prn  . oxyCODONE (OXY IR/ROXICODONE) 5 MG immediate release tablet Take 5 mg by mouth 2 (two) times daily as needed for severe pain.   prn at prn  . pregabalin (LYRICA) 25 MG capsule Take 1 capsule (25 mg total) by mouth 2 (two) times daily. 20 capsule 0 11/19/2015 at Unknown time  . Tamsulosin HCl (FLOMAX) 0.4 MG CAPS Take 0.4 mg by mouth at bedtime.    11/18/2015 at Unknown time  . tiotropium (SPIRIVA) 18 MCG inhalation capsule Place 18 mcg into inhaler and inhale daily.   11/19/2015 at Unknown time   Scheduled: .  stroke: mapping our early stages of recovery book   Does not apply Once  . apixaban  5 mg Oral BID  . aspirin EC  81 mg Oral QHS  . ezetimibe  10 mg Oral Daily  . loratadine  10 mg Oral Daily  . metoprolol tartrate  12.5 mg Oral BID  . mometasone-formoterol  2 puff Inhalation BID  . multivitamin with minerals  1 tablet Oral Daily  . nicotine  21 mg Transdermal Daily  . tamsulosin  0.4 mg Oral QHS  . tiotropium  18 mcg Inhalation Daily    ROS: History obtained from the patient  General ROS: negative for - chills, fatigue, fever, night sweats, weight gain or weight loss Psychological ROS:  negative for - behavioral disorder, hallucinations, memory difficulties, mood swings or suicidal ideation Ophthalmic ROS: negative for - blurry vision, double vision, eye pain or loss of vision ENT ROS: negative for - epistaxis, nasal discharge, oral lesions, sore throat, tinnitus or vertigo Allergy and Immunology ROS: negative for - hives or itchy/watery eyes Hematological and Lymphatic ROS: negative for - bleeding problems, bruising or swollen lymph nodes Endocrine ROS: negative for - galactorrhea, hair pattern changes, polydipsia/polyuria or temperature intolerance Respiratory ROS: negative for - cough, hemoptysis, shortness of breath or wheezing Cardiovascular ROS: negative for - chest pain, dyspnea on exertion, edema or irregular heartbeat Gastrointestinal ROS: negative for - abdominal pain, diarrhea, hematemesis, nausea/vomiting or stool incontinence Genito-Urinary ROS: negative for - dysuria, hematuria, incontinence or urinary frequency/urgency Musculoskeletal ROS: right shoulder pain Neurological ROS: as noted in HPI Dermatological ROS: negative for rash and skin lesion changes  Physical Examination: Blood pressure (!) 117/57, pulse 74, temperature 97.5 F (36.4 C), temperature source Oral, resp. rate 18, height 5' 8"  (1.727 m), weight 71.5 kg (157 lb 11.2 oz), SpO2 96 %.  HEENT-  Normocephalic, no lesions, without obvious abnormality.  Normal external eye and conjunctiva.  Normal TM's bilaterally.  Normal auditory canals and external ears. Normal external nose, mucus membranes and septum.  Normal pharynx. Cardiovascular- S1, S2 normal, pulses palpable throughout   Lungs- some rhonchi noted Abdomen- soft, non-tender; bowel sounds normal; no masses,  no organomegaly Extremities- no edema Lymph-no adenopathy palpable Musculoskeletal-no joint tenderness, deformity or swelling Skin-warm and dry, no hyperpigmentation, vitiligo, or suspicious lesions  Neurological Examination Mental  Status: Alert, oriented, thought content appropriate.  Speech fluent without evidence of aphasia.  Able to follow 3 step commands without difficulty. Cranial Nerves: II: Discs flat bilaterally; Visual fields grossly normal, pupils equal, round, reactive to light and accommodation III,IV, VI: ptosis not present, extra-ocular motions intact bilaterally V,VII: smile symmetric,  facial light touch sensation normal bilaterally VIII: hearing normal bilaterally IX,X: gag reflex present XI: bilateral shoulder shrug XII: midline tongue extension Motor: Right : Upper extremity   5/5    Left:     Upper extremity   5-/5 with pronator drift  Lower extremity   5/5     Lower extremity   5/5 Tone and bulk:normal tone throughout; no atrophy noted Sensory: Pinprick and light touch intact throughout, bilaterally Deep Tendon Reflexes: 2+ and symmetric throughout Plantars: Right: downgoing   Left: downgoing Cerebellar: Normal finger-to-nose and normal heel-to-shin testing bilaterally Gait: not tested due to safety concerns   Laboratory Studies:  Basic Metabolic Panel:  Recent Labs Lab 11/19/15 1641 11/20/15 0034  NA 134*  --   K 4.1  --   CL 98*  --   CO2 26  --   GLUCOSE 100*  --   BUN 13  --   CREATININE 0.82 0.67  CALCIUM 9.4  --     Liver Function Tests: No results for input(s): AST, ALT, ALKPHOS, BILITOT, PROT, ALBUMIN in the last 168 hours. No results for input(s): LIPASE, AMYLASE in the last 168 hours. No results for input(s): AMMONIA in the last 168 hours.  CBC:  Recent Labs Lab 11/19/15 1641 11/20/15 0034  WBC 6.7 6.1  HGB 14.1 12.8*  HCT 40.2 36.4*  MCV 95.0 95.1  PLT 304 263    Cardiac Enzymes:  Recent Labs Lab 11/20/15 0034 11/20/15 0630 11/20/15 1233  TROPONINI <0.03 <0.03 <0.03    BNP: Invalid input(s): POCBNP  CBG: No results for input(s): GLUCAP in the last 168 hours.  Microbiology: Results for orders placed or performed during the hospital  encounter of 10/30/15  MRSA PCR Screening     Status: None   Collection Time: 10/30/15  2:50 PM  Result Value Ref Range Status   MRSA by PCR NEGATIVE NEGATIVE Final    Comment:        The GeneXpert MRSA Assay (FDA approved for NASAL specimens only), is one component of a comprehensive MRSA colonization surveillance program. It is not intended to diagnose MRSA infection nor to guide or monitor treatment for MRSA infections.     Coagulation Studies: No results for input(s): LABPROT, INR in the last 72 hours.  Urinalysis:  Recent Labs Lab 11/19/15 0644  COLORURINE YELLOW*  LABSPEC 1.012  PHURINE 7.0  GLUCOSEU NEGATIVE  HGBUR NEGATIVE  BILIRUBINUR NEGATIVE  KETONESUR NEGATIVE  PROTEINUR NEGATIVE  NITRITE NEGATIVE  LEUKOCYTESUR NEGATIVE    Lipid Panel:    Component Value Date/Time   CHOL 116 11/20/2015 0630   TRIG 65 11/20/2015 0630   HDL 51 11/20/2015 0630   CHOLHDL 2.3 11/20/2015 0630   VLDL 13 11/20/2015 0630   LDLCALC 52 11/20/2015 0630    HgbA1C:  Lab Results  Component Value Date   HGBA1C 5.9 10/30/2014    Urine Drug Screen:  No results found for: LABOPIA, COCAINSCRNUR, LABBENZ, AMPHETMU, THCU, LABBARB  Alcohol Level: No results for input(s): ETH in the last 168 hours.  Other results: EKG: normal sinus rhythm at 85 bpm.  Imaging: Dg Chest 2 View  Result Date: 11/20/2015 CLINICAL DATA:  Transient ischemic attack, recent rib fracture EXAM: CHEST  2 VIEW COMPARISON:  Chest x-ray of 11/10/2015 and CT chest of 10/27/2015 FINDINGS: There is little change in volume of the small right pleural effusion with mild right basilar volume loss. The left lung is grossly clear. The small nodules noted by CT chest are  not as well seen by chest x-ray. No pneumothorax is seen. Fractures of the right lateral eighth, ninth, and tenth ribs are visible. The heart is within normal limits in size. IMPRESSION: 1. No change in the volume of the small right pleural effusion with  right basilar atelectasis. 2. Acute fractures of the right posterolateral eighth, ninth, and tenth ribs are visible. No pneumothorax. Electronically Signed   By: Ivar Drape M.D.   On: 11/20/2015 09:06   Ct Head Wo Contrast  Result Date: 11/19/2015 CLINICAL DATA:  Left arm numbness for 3 days EXAM: CT HEAD WITHOUT CONTRAST TECHNIQUE: Contiguous axial images were obtained from the base of the skull through the vertex without intravenous contrast. COMPARISON:  11/26/2011 FINDINGS: Global atrophy appropriate to age. Minimal chronic ischemic changes in the periventricular white matter. No mass effect, midline shift, or acute hemorrhage. IMPRESSION: No acute intracranial pathology. Electronically Signed   By: Marybelle Killings M.D.   On: 11/19/2015 20:41   US Carotid Bilateral (at [facility] And [facility] Only)  Result Date: 11/20/2015 CLINICAL DATA:  75 year old male with a history of TIA. Cardiovascular risk factors include stroke/TIA, hyperlipidemia, known coronary disease, tobacco use. Given history of bilateral carotid endarterectomy. EXAM: BILATERAL CAROTID DUPLEX ULTRASOUND TECHNIQUE: Pearline Cables scale imaging, color Doppler and duplex ultrasound were performed of bilateral carotid and vertebral arteries in the neck. COMPARISON:  No prior duplex FINDINGS: Criteria: Quantification of carotid stenosis is based on velocity parameters that correlate the residual internal carotid diameter with NASCET-based stenosis levels, using the diameter of the distal internal carotid lumen as the denominator for stenosis measurement. The following velocity measurements were obtained: RIGHT ICA:  Systolic 62 cm/sec, Diastolic 17 cm/sec CCA:  77 cm/sec SYSTOLIC ICA/CCA RATIO:  0.8 ECA:  71 cm/sec LEFT ICA:  Systolic 65 cm/sec, Diastolic 20 cm/sec CCA:  81 cm/sec SYSTOLIC ICA/CCA RATIO:  0.8 ECA:  74 cm/sec Right Brachial SBP: Not acquired Left Brachial SBP: Not acquired RIGHT CAROTID ARTERY: No significant calcifications of the right common carotid  artery. Intermediate waveform maintained. Heterogeneous and partially calcified plaque/intimal hyperplasia at the right carotid bifurcation. No significant lumen shadowing. Low resistance waveform of the right ICA. No significant tortuosity. RIGHT VERTEBRAL ARTERY: Antegrade flow with low resistance waveform. LEFT CAROTID ARTERY: No significant calcifications of the left common carotid artery. Intermediate waveform maintained. Heterogeneous and partially calcified plaque/intimal hyperplasia at the left carotid bifurcation without significant lumen shadowing. Low resistance waveform of the left ICA. No significant tortuosity. LEFT VERTEBRAL ARTERY:  Antegrade flow with low resistance waveform. IMPRESSION: Note that the patient is reported to be status post bilateral endarterectomy, and established carotid duplex criteria have not been validated for these postoperative patients. Bilateral internal carotid artery maintain patency, without definite evidence of recurrent stenosis at the bifurcation. Signed, Dulcy Fanny. Earleen Newport, DO [facility] Electronically Signed   By: Corrie Mckusick D.O.   On: 11/20/2015 09:58   Dg Chest Portable 1 View  Result Date: 11/19/2015 CLINICAL DATA:  Left arm numbness 2 to 3 times a day for the past week, mostly while sitting. EXAM: PORTABLE CHEST 1 VIEW COMPARISON:  Chest 11/10/2015.  CT chest 10/27/2015. FINDINGS: Normal heart size and pulmonary vascularity. Surgical clips in the right upper lung. Right pleural effusion and mild basilar atelectasis similar to previous study. Focal area of nodular scarring demonstrated in the right upper lung corresponding to a cavitary lesions seen on previous chest CT. No change since the prior radiographs. Additional pulmonary nodules demonstrated  at CT are not visualized radiographically. Diffuse emphysematous changes in the lungs. No focal consolidation or airspace disease. Mediastinal contours  appear intact. Tortuous aorta. Degenerative changes in the shoulders. Old right rib fractures. IMPRESSION: No acute changes since previous study. Nodular scarring and pleural effusion again demonstrated on the right without change. Emphysematous changes in the lungs. Electronically Signed   By: Lucienne Capers M.D.   On: 11/19/2015 20:23    Assessment: 75 y.o. male presenting with LUE numbness and weakness that has markedly improved with only some mild LUE noted on examination today.  Concern was for acute infarct despite fact that patient on Eliquis.  Head CT personally reviewed and shows no acute infarct.  Patient refuses MR imaging due to severe claustrophobia.  Recent TEE on 8/11 showed an EF of 35-40% with global hypokinesis.  Carotid dopplers shows no large vessel occlusion.  LDL 52.  A1c 5.9 on 8/10.  Although stroke still remains on the differential patient with a history of lung cancer.  Would like to rule out the possibility of a cervical met.  Further work up recommended. Patient has consented to a CT that he wishes to be performed as an outpatient.    Stroke Risk Factors - hyperlipidemia, hypertension and smoking  Plan: 1. Prophylactic therapy-Continue Eliquis 2. CT of the cervical spine to be performed as an outpatient 3. Follow up with neurology as an outpatient 4. OT   Alexis Goodell, MD Neurology [phone] 11/20/2015, 2:28 PM

## 2015-11-20 NOTE — Progress Notes (Signed)
A&O. Up with standby assist. Admitted for weakness and numbness. Neuro checks and NIH scale negative. No complaints. VSS.

## 2015-11-20 NOTE — Progress Notes (Signed)
Pt discharged to home via wc.  Instructions and rx(Pravastatin) given to pt.  Questions answered.  No distress.

## 2015-11-20 NOTE — Care Management Obs Status (Signed)
[place] NOTIFICATION   Patient Details  Name: MERRICK MAGGIO MRN: [id number] Date of Birth: 26-Oct-1940   Medicare Observation Status Notification Given:  Yes    Katrina Stack, RN 11/20/2015, 12:05 PM

## 2015-11-21 ENCOUNTER — Emergency Department: Payer: Medicare Other

## 2015-11-21 ENCOUNTER — Emergency Department
Admission: EM | Admit: 2015-11-21 | Discharge: 2015-11-21 | Disposition: A | Discharge status: Home / Self Care | Payer: Medicare Other | Attending: Emergency Medicine | Admitting: Emergency Medicine

## 2015-11-21 ENCOUNTER — Telehealth: Payer: Self-pay

## 2015-11-21 ENCOUNTER — Encounter: Payer: Self-pay | Admitting: Emergency Medicine

## 2015-11-21 ENCOUNTER — Telehealth: Payer: Self-pay | Admitting: Family Medicine

## 2015-11-21 DIAGNOSIS — Z7982 Long term (current) use of aspirin: Secondary | ICD-10-CM | POA: Diagnosis not present

## 2015-11-21 DIAGNOSIS — M5412 Radiculopathy, cervical region: Secondary | ICD-10-CM | POA: Insufficient documentation

## 2015-11-21 DIAGNOSIS — Z8546 Personal history of malignant neoplasm of prostate: Secondary | ICD-10-CM | POA: Insufficient documentation

## 2015-11-21 DIAGNOSIS — Z85118 Personal history of other malignant neoplasm of bronchus and lung: Secondary | ICD-10-CM | POA: Diagnosis not present

## 2015-11-21 DIAGNOSIS — I5022 Chronic systolic (congestive) heart failure: Secondary | ICD-10-CM | POA: Insufficient documentation

## 2015-11-21 DIAGNOSIS — R209 Unspecified disturbances of skin sensation: Secondary | ICD-10-CM | POA: Diagnosis not present

## 2015-11-21 DIAGNOSIS — Z79899 Other long term (current) drug therapy: Secondary | ICD-10-CM | POA: Insufficient documentation

## 2015-11-21 DIAGNOSIS — Z8673 Personal history of transient ischemic attack (TIA), and cerebral infarction without residual deficits: Secondary | ICD-10-CM | POA: Diagnosis not present

## 2015-11-21 DIAGNOSIS — J449 Chronic obstructive pulmonary disease, unspecified: Secondary | ICD-10-CM | POA: Insufficient documentation

## 2015-11-21 DIAGNOSIS — R202 Paresthesia of skin: Secondary | ICD-10-CM | POA: Diagnosis not present

## 2015-11-21 DIAGNOSIS — R200 Anesthesia of skin: Secondary | ICD-10-CM | POA: Diagnosis not present

## 2015-11-21 DIAGNOSIS — I252 Old myocardial infarction: Secondary | ICD-10-CM | POA: Diagnosis not present

## 2015-11-21 DIAGNOSIS — I2510 Atherosclerotic heart disease of native coronary artery without angina pectoris: Secondary | ICD-10-CM | POA: Insufficient documentation

## 2015-11-21 DIAGNOSIS — I110 Hypertensive heart disease with heart failure: Secondary | ICD-10-CM | POA: Diagnosis not present

## 2015-11-21 DIAGNOSIS — F17210 Nicotine dependence, cigarettes, uncomplicated: Secondary | ICD-10-CM | POA: Diagnosis not present

## 2015-11-21 LAB — BASIC METABOLIC PANEL
ANION GAP: 6 (ref 5–15)
BUN: 14 mg/dL (ref 6–20)
CHLORIDE: 99 mmol/L — AB (ref 101–111)
CO2: 29 mmol/L (ref 22–32)
Calcium: 9.2 mg/dL (ref 8.9–10.3)
Creatinine, Ser: 0.83 mg/dL (ref 0.61–1.24)
Glucose, Bld: 112 mg/dL — ABNORMAL HIGH (ref 65–99)
POTASSIUM: 3.8 mmol/L (ref 3.5–5.1)
SODIUM: 134 mmol/L — AB (ref 135–145)

## 2015-11-21 MED ORDER — IOPAMIDOL (ISOVUE-300) INJECTION 61%
75.0000 mL | Freq: Once | INTRAVENOUS | Status: AC | PRN
  Administered 2015-11-21: 75 mL via INTRAVENOUS

## 2015-11-21 NOTE — ED Triage Notes (Signed)
Pt presents with left arm numbness. Pt was discharged from hospital yesterday. Pt states he was admitted for the same problem and diagnosed with heart failure. Pt reports the numbness has not gotten any worse but has not subsided. Pt alert and oriented in triage. Ambulated without difficulty. Speech clear. Bilateral strong hand grips.

## 2015-11-21 NOTE — Telephone Encounter (Signed)
Transitional call attempted; left v/m for pt to cb. Per chart review tab pt is presently at [facility] ED.

## 2015-11-21 NOTE — Telephone Encounter (Signed)
[place] Call Center  Patient Name: [name]  DOB: 04-18-1940    Initial Comment Caller states has been in hospital for numbness in arm, numbness has came back   Nurse Assessment  Nurse: Wynetta Emery, RN, Baker Janus Date/Time Eilene Ghazi Time): 11/21/2015 9:39:42 AM  Confirm and document reason for call. If symptomatic, describe symptoms. You must click the next button to save text entered. ---[name] states was admitted to hospital for TIA numbness in left arm; discharge yesterday and today the numbness is back after 65m last night and still present this am intermittently  Has the patient traveled out of the country within the last 30 days? ---No  Does the patient have any new or worsening symptoms? ---Yes  Will a triage be completed? ---Yes  Related visit to physician within the last 2 weeks? ---N/A  Does the PT have any chronic conditions? (i.e. diabetes, asthma, etc.) ---Yes  List chronic conditions. ---TIA  Is this a behavioral health or substance abuse call? ---No     Guidelines    Guideline Title Affirmed Question Affirmed Notes  Neurologic Deficit [1] Numbness (i.e., loss of sensation) of the face, arm / hand, or leg / foot on one side of the body AND [2] sudden onset AND [3] brief (now gone)    Final Disposition User   Go to ED Now (or PCP triage) JWynetta Emery RN, GBaker Janus   Comments  NOTE: Nurse advised since they think this could be TIA and he is still having the numbness intermittently he needs to go back to the ED and be reevaluated   [facility]   Disagree/Comply: Comply

## 2015-11-21 NOTE — ED Provider Notes (Signed)
[facility] Emergency Department Provider Note  ____________________________________________   First MD Initiated Contact with Patient 11/21/15 1500     (approximate)  I have reviewed the triage vital signs and the nursing notes.   HISTORY  Chief Complaint Numbness    HPI [name] is a 75 y.o. male who was just discharged yesterday from this hospital for evaluation of possible TIA.  He presents today with the same symptoms which consist primarily of numbness in his left arm and hand.  I reviewed his medical records and it appears that he had a normal head CT.  He was evaluated by neurology.  He was started on Eliquis and had normal carotid Dopplers.  The plan was for CT cervical spine with contrast as an outpatient due to his history of cancer and the need to rule out metastases which may be impinging on the spinal cord/nerves.  He opted to do this as an outpatient rather than while he was in the hospital.  He called the [place] line today because of persistent paresthesia and was told to return to the ED for further evaluation.    He denies any new weakness nor numbness.  Denies headache, fever/chills, CP, SOB, abd pain, N/V. Normal strength in all four extremities, ambulating without difficulty.   Past Medical History:  Diagnosis Date  . AAA (abdominal aortic aneurysm) (HCC)    4.2 cm IR AAA noted on 01/13/15 PET scan  . Anxiety   . CAD (coronary artery disease)    AMI 1997 w/ BMS x 2 LAD, BMS CFX 2002, DES x 2 LAD 2009, DES CFX & OM 2012   . Carotid artery disease ([place])    RICA CEA 5102, LICA CEA 5852 per Dr. [name],   . CHF (congestive heart failure) ([place])    pt denied in 2014  . COPD (chronic obstructive pulmonary disease) ([place])   . GERD (gastroesophageal reflux disease)    occ  . HTN (hypertension)   . Hydradenitis   . Hyperlipidemia   . Ischemic cardiomyopathy    EF 40-45 percent at cath 2012  . Myocardial infarction ([place])   .  Non-small cell lung cancer (NSCLC) ([place]) dx'd 12/2014   SBRT  . Osteoarthritis    pt. denies 01/17/2015  . PAC (premature atrial contraction)   . Peripheral vascular disease ([place])   . Pneumonia   . Prostate cancer ([place]) dx'd approx 2008   xrt through[facility]  . Radiation 02/18/15-02/25/15   right upper lung 54 Gy  . Shortness of breath dyspnea   . Stroke [facility]) 11-24-11   "no feeling in right 5th digit"  . Urinary frequency   . Urinary urgency     Patient Active Problem List   Diagnosis Date Noted  . Malnutrition of moderate degree 11/20/2015  . TIA (transient ischemic attack) 11/19/2015  . Weakness 11/17/2015  . Physical deconditioning 11/10/2015  . Multiple rib fractures 11/10/2015  . Chronic systolic heart failure ([place]) 11/02/2015  . Atrial flutter with rapid ventricular response ([place]) 10/30/2015  . Atrial flutter ([place]) 10/30/2015  . B12 deficiency 07/22/2015  . Anemia 07/15/2015  . Hematuria 07/08/2015  . Unintentional weight loss 07/08/2015  . Orthostatic hypotension 07/08/2015  . 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 Cough 05/22/2015  . Tobacco user 05/22/2015  . Mediastinal lymphadenopathy 01/22/2015  . 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 S/P bronchoscopy with biopsy   . Colon cancer screening 11/06/2014  . Encounter for  Medicare annual wellness exam 11/06/2014  . Routine general medical examination at a health care facility 11/06/2014  . Lung nodule 06/28/2014  . Carotid stenosis 12/20/2013  . Aftercare following surgery of the circulatory system 12/20/2013  . Aftercare following surgery of the circulatory system, [place] 12/19/2012  . Personal history of colonic polyps 08/28/2012  . Hyperglycemia 08/07/2012  . Pre-operative cardiovascular examination 11/03/2011  . Carotid artery disease ([place]) 05/20/2011  . Grief reaction 05/07/2011  . Ischemic cardiomyopathy 01/13/2011  . Palpitations 11/10/2010  . Occlusion and stenosis of carotid artery without  mention of cerebral infarction 04/02/2010  . Anxiety disorder 12/12/2009  . HYPERTENSION, BENIGN 09/29/2008  . History of prostate cancer 09/19/2008  . Hyperlipidemia 02/10/2007  . AMAUROSIS FUGAX 02/10/2007  . MYOCARDIAL INFARCTION, HX OF 02/10/2007  . Coronary atherosclerosis 02/10/2007  . PERIPHERAL VASCULAR DISEASE 02/10/2007  . HEMORRHOIDS 02/10/2007  . Chronic obstructive pulmonary disease ([place]) 02/10/2007  . OSTEOARTHRITIS 02/10/2007  . Nicotine dependence 02/10/2007  . Pure hypercholesterolemia 01/27/2007  . CORONARY ATHEROSCLEROSIS NATIVE CORONARY ARTERY 01/27/2007    Past Surgical History:  Procedure Laterality Date  . CARDIAC CATHETERIZATION    . CARDIOVASCULAR STRESS TEST  12-2006  . CARDIOVERSION N/A 10/31/2015   Procedure: CARDIOVERSION;  Surgeon: Sueanne Margarita, MD;  Location: [place];  Service: Cardiovascular;  Laterality: N/A;  . CAROTID ENDARTERECTOMY Right 03-2000   CE  . CAROTID ENDARTERECTOMY Left 11-24-11   CE  . COLONOSCOPY  04-2001   polyp  . [place], 2002, 2009, 2012   8 stents total  . [place]   neg  . ENDARTERECTOMY  11/24/2011   Procedure: ENDARTERECTOMY CAROTID;  Surgeon: Rosetta Posner, MD;  Location: [place];  Service: Vascular;  Laterality: Left;  . EYE SURGERY Bilateral Aug. 17, 2015   Cataract  . FUDUCIAL PLACEMENT N/A 12/25/2014   Procedure: PLACEMENT OF FUDUCIAL;  Surgeon: Collene Gobble, MD;  Location: [place];  Service: Thoracic;  Laterality: N/A;  . PROSTATE SURGERY     Radiation Tx  X's 40  . TEE WITHOUT CARDIOVERSION N/A 10/31/2015   Procedure: TRANSESOPHAGEAL ECHOCARDIOGRAM (TEE);  Surgeon: Sueanne Margarita, MD;  Location: [facility] ENDOSCOPY;  Service: Cardiovascular;  Laterality: N/A;  . VASCULAR SURGERY    . VIDEO BRONCHOSCOPY WITH ENDOBRONCHIAL NAVIGATION N/A 12/25/2014   Procedure: VIDEO BRONCHOSCOPY WITH ENDOBRONCHIAL NAVIGATION  with Biopsies and Brushings;  Surgeon: Collene Gobble, MD;  Location:  [place];  Service: Thoracic;  Laterality: N/A;  . VIDEO BRONCHOSCOPY WITH ENDOBRONCHIAL ULTRASOUND N/A 01/22/2015   Procedure: VIDEO BRONCHOSCOPY WITH ENDOBRONCHIAL ULTRASOUND with Biopsies;  Surgeon: Collene Gobble, MD;  Location: [place];  Service: Thoracic;  Laterality: N/A;    Prior to Admission medications   Medication Sig Start Date End Date Taking? Authorizing Provider  acetaminophen (TYLENOL) 500 MG tablet Take 1,000 mg by mouth every 4 (four) hours as needed for mild pain, moderate pain, fever or headache.  10/17/15  Yes Historical Provider, MD  albuterol (PROVENTIL HFA;VENTOLIN HFA) 108 (90 Base) MCG/ACT inhaler Inhale 2 puffs into the lungs every 4 (four) hours as needed for wheezing or shortness of breath.   Yes Historical Provider, MD  apixaban (ELIQUIS) 5 MG TABS tablet Take 1 tablet (5 mg total) by mouth 2 (two) times daily. 11/01/15  Yes Mariel Aloe, MD  aspirin EC 81 MG tablet Take 81 mg by mouth at bedtime.    Yes Historical Provider, MD  ezetimibe (ZETIA) 10 MG  tablet Take 1 tablet (10 mg total) by mouth daily. 11/01/15  Yes Mariel Aloe, MD  Fluticasone-Salmeterol (ADVAIR DISKUS) 500-50 MCG/DOSE AEPB Inhale 1 puff into the lungs 2 (two) times daily. 06/28/14 04/27/16 Yes Tammy S Parrett, NP  loratadine (CLARITIN) 10 MG tablet Take 10 mg by mouth daily.   Yes Historical Provider, MD  metoprolol tartrate (LOPRESSOR) 12.5 mg TABS tablet Take 12.5 mg by mouth 2 (two) times daily.   Yes Historical Provider, MD  Multiple Vitamin (MULTIVITAMIN WITH MINERALS) TABS tablet Take 1 tablet by mouth daily.   Yes Historical Provider, MD  Tamsulosin HCl (FLOMAX) 0.4 MG CAPS Take 0.4 mg by mouth at bedtime.    Yes Historical Provider, MD  tiotropium (SPIRIVA) 18 MCG inhalation capsule Place 18 mcg into inhaler and inhale daily.   Yes Historical Provider, MD  ALPRAZolam Duanne Moron) 0.5 MG tablet Take 0.5 mg by mouth at bedtime as needed for anxiety or sleep.    Historical Provider, MD    ipratropium-albuterol (DUONEB) 0.5-2.5 (3) MG/3ML SOLN Take 3 mLs by nebulization every 4 (four) hours as needed (shortness of breath and wheezing).    Historical Provider, MD  nitroGLYCERIN (NITROSTAT) 0.4 MG SL tablet Place 0.4 mg under the tongue every 5 (five) minutes as needed for chest pain.    Historical Provider, MD  oxyCODONE (OXY IR/ROXICODONE) 5 MG immediate release tablet Take 5 mg by mouth 2 (two) times daily as needed for severe pain.    Historical Provider, MD    Allergies Augmentin [amoxicillin-pot clavulanate]; Chantix [varenicline]; Sulfonamide derivatives; and Zantac [ranitidine hcl]  Family History  Problem Relation Age of Onset  . Stroke Father   . Heart disease Father   . Heart disease Mother     pacemaker  . Other Brother     benign brain tumor  . Cancer Brother     Eye-behind  . Cancer Sister     Breast  . Liver cancer Brother   . Diabetes Maternal Grandmother     Social History Social History  Substance Use Topics  . Smoking status: Current Every Day Smoker    Packs/day: 1.00    Years: 55.00    Types: Cigarettes  . Smokeless tobacco: Never Used     Comment: 1/2 ppd (01/07/15)  . Alcohol use 0.0 oz/week     Comment: occ    Review of Systems Constitutional: No fever/chills Eyes: No visual changes. ENT: No sore throat. Cardiovascular: Denies chest pain. Respiratory: Denies shortness of breath. Gastrointestinal: No abdominal pain.  No nausea, no vomiting.  No diarrhea.  No constipation. Genitourinary: Negative for dysuria. Musculoskeletal: Negative for back pain. Skin: Negative for rash. Neurological: Intermittent numbness primarily in left hand, occasionally in left forearm  10-point ROS otherwise negative.  ____________________________________________   PHYSICAL EXAM:  VITAL SIGNS: ED Triage Vitals [11/21/15 1102]  Enc Vitals Group     BP 120/61     Pulse Rate 82     Resp 18     Temp 97.7 F (36.5 C)     Temp Source Oral      SpO2 98 %     Weight 157 lb (71.2 kg)     Height '5\' 8"'$  (1.727 m)     Head Circumference      Peak Flow      Pain Score 0     Pain Loc      Pain Edu?      Excl. in [place]?     Constitutional: Alert  and oriented. Well appearing and in no acute distress. Eyes: Conjunctivae are normal. PERRL. EOMI. Head: Atraumatic. Nose: No congestion/rhinnorhea. Mouth/Throat: Mucous membranes are moist.  Oropharynx non-erythematous. Neck: No stridor.  No meningeal signs.  No cervical spine tenderness to palpation. Cardiovascular: Normal rate, regular rhythm. Good peripheral circulation. Grossly normal heart sounds. Respiratory: Normal respiratory effort.  No retractions. Lungs CTAB. Gastrointestinal: Soft and nontender. No distention.  Musculoskeletal: No lower extremity tenderness nor edema. No gross deformities of extremities. Neurologic:  Normal speech and language. No gross focal neurologic deficits are appreciated except for slightly decreased light touch sensation on the ulnar aspect of the hand. Skin:  Skin is warm, dry and intact. No rash noted. Psychiatric: Mood and affect are normal. Speech and behavior are normal.  ____________________________________________   LABS (all labs ordered are listed, but only abnormal results are displayed)  Labs Reviewed  BASIC METABOLIC PANEL - Abnormal; Notable for the following:       Result Value   Sodium 134 (*)    Chloride 99 (*)    Glucose, Bld 112 (*)    All other components within normal limits   ____________________________________________  EKG  None - EKG not ordered by ED physician ____________________________________________  RADIOLOGY   Ct Head Wo Contrast  Result Date: 11/21/2015 CLINICAL DATA:  Left arm numbness. EXAM: CT HEAD WITHOUT CONTRAST CT CERVICAL SPINE WITHOUT CONTRAST TECHNIQUE: Multidetector CT imaging of the head and cervical spine was performed following the standard protocol without intravenous contrast. Multiplanar CT  image reconstructions of the cervical spine were also generated. COMPARISON:  CT scan of November 19, 2015. FINDINGS: CT HEAD FINDINGS Bony calvarium appears intact. Mild diffuse cortical atrophy is noted. Mild chronic ischemic white matter disease is noted. No mass effect or midline shift is noted. Ventricular size is within normal limits. There is no evidence of mass lesion, hemorrhage or acute infarction. CT CERVICAL SPINE FINDINGS No fracture or spondylolisthesis is noted. Mild degenerative disc disease is noted at C5-6 and C6-7. Degenerative changes seen involving the right-sided posterior facet joints. Patient appears to be status post bilateral carotid endarterectomy. Scarring is noted in both lung apices. IMPRESSION: Mild diffuse cortical atrophy. Mild chronic ischemic white matter disease. No acute intracranial abnormality seen. Multilevel degenerative disc disease. No acute abnormality seen in the cervical spine. Electronically Signed   By: Marijo Conception, M.D.   On: 11/21/2015 16:08   Ct Cervical Spine Wo Contrast  Result Date: 11/21/2015 CLINICAL DATA:  Left arm numbness. EXAM: CT HEAD WITHOUT CONTRAST CT CERVICAL SPINE WITHOUT CONTRAST TECHNIQUE: Multidetector CT imaging of the head and cervical spine was performed following the standard protocol without intravenous contrast. Multiplanar CT image reconstructions of the cervical spine were also generated. COMPARISON:  CT scan of November 19, 2015. FINDINGS: CT HEAD FINDINGS Bony calvarium appears intact. Mild diffuse cortical atrophy is noted. Mild chronic ischemic white matter disease is noted. No mass effect or midline shift is noted. Ventricular size is within normal limits. There is no evidence of mass lesion, hemorrhage or acute infarction. CT CERVICAL SPINE FINDINGS No fracture or spondylolisthesis is noted. Mild degenerative disc disease is noted at C5-6 and C6-7. Degenerative changes seen involving the right-sided posterior facet joints. Patient  appears to be status post bilateral carotid endarterectomy. Scarring is noted in both lung apices. IMPRESSION: Mild diffuse cortical atrophy. Mild chronic ischemic white matter disease. No acute intracranial abnormality seen. Multilevel degenerative disc disease. No acute abnormality seen in the cervical spine. Electronically Signed  By: Marijo Conception, M.D.   On: 11/21/2015 16:08   Ct Cervical Spine W Contrast  Result Date: 11/21/2015 CLINICAL DATA:  LEFT arm numbness, assess for metastasis in cervical spine causing LEFT upper extremity paresthesia. EXAM: CT CERVICAL SPINE WITH CONTRAST TECHNIQUE: Multidetector CT imaging of the cervical spine was performed during intravenous contrast administration. Multiplanar CT image reconstructions were also generated. CONTRAST:  35m ISOVUE-300 IOPAMIDOL (ISOVUE-300) INJECTION 61% COMPARISON:  CT cervical spine November 21, 2015 at 1546 hours FINDINGS: ALIGNMENT: Cervical vertebral bodies in alignment. Straightened cervical lordosis. OSSEOUS STRUCTURES: Cervical vertebral bodies and posterior elements are intact. Moderate C5-6 disc height loss, mild at C3-4 and C6-7. No destructive bony lesions. C1-2 articulation with moderate arthropathy. No abnormal osseous or intradiscal enhancement though limited evaluation on CT. Facet arthropathy and uncovertebral hypertrophy results in moderate RIGHT C4-5, moderate to severe RIGHT C5-6 and moderate LEFT C5-6 neural foraminal narrowing. No canal stenosis. SOFT TISSUES: Included prevertebral and paraspinal soft tissues are normal. Normal normal enhancement though limited assessment by CT. RIGHT pleural effusion. Apical bullous changes and centrilobular emphysema. Biapical scarring. Patulous bilateral carotid bulbs with RIGHT intimal thickening. Probable bilateral carotid endarterectomy. Small LEFT laryngocele. IMPRESSION: No acute fracture or malalignment. No CT findings of metastasis though, MRI with contrast is more sensitive.  Electronically Signed   By: CElon AlasM.D.   On: 11/21/2015 16:32    ____________________________________________   PROCEDURES  Procedure(s) performed:   Procedures   Critical Care performed: No ____________________________________________   INITIAL IMPRESSION / ASSESSMENT AND PLAN / ED COURSE  Pertinent labs & imaging results that were available during my care of the patient were reviewed by me and considered in my medical decision making (see chart for details).  When I saw the patient's triage note, I immediately called Dr. RDoy Minceand discussed the case with her.  She pointed out that the patient is on maximal medical therapy at this time including the Eliquis.  She suggested obtaining a noncontrast head CT to make sure he does not have any acute bleeding and an IV contrast cervical spine CT to rule out mets.  The patient has severe claustrophobia and says that he cannot tolerate an MRI.  Clinical Course  Value Comment By Time   Arnell the CT technologist brought to my attention that he thought I ordered the CT C-spine w/ contrast in error and changed the order to a non-contrast CT scan and proceeded with the scan.  Unfortunately he did not verify with me until after the scan.  I explained that the original order was in fact correct, that the correct CT scan needs to be performed, and that an incident report needs to be filed and the patient not charged for the erroneous study. CHinda Kehr MD 09/01 1559  CT CERVICAL SPINE W CONTRAST Fortunately both non-con head and C-spine w/ contrast CT scans are normal.  Will update patient and family and advise outpatient follow up as per Dr. RDoy Mince recommendations. CHinda Kehr MD 09/01 1647   I updated the family and reiterated the return precautions.  I believe there is nothing else that we can provide at this time as he has been maximally treated and he has reassuring scans.  Additionally, I did update the patient's daughter  about the erroneous CT scan of the cervical spine without contrast and explained that I would submit documentation so that hopefully he will not be inadvertently charged for that study. CHinda Kehr MD 09/01 1730    ____________________________________________  FINAL CLINICAL IMPRESSION(S) / ED DIAGNOSES  Final diagnoses:  Paresthesia of left arm  Paresthesia  Cervical radiculopathy     MEDICATIONS GIVEN DURING THIS VISIT:  Medications  iopamidol (ISOVUE-300) 61 % injection 75 mL (75 mLs Intravenous Contrast Given 11/21/15 1606)     NEW OUTPATIENT MEDICATIONS STARTED DURING THIS VISIT:  Discharge Medication List as of 11/21/2015  5:34 PM        Note:  This document was prepared using Dragon voice recognition software and may include unintentional dictation errors.    Hinda Kehr, MD 11/21/15 [phone]

## 2015-11-21 NOTE — Telephone Encounter (Signed)
Will watch for records

## 2015-11-21 NOTE — Discharge Instructions (Signed)
As we discussed, your head CT and cervical spine CT showed no findings to suggest either a stroke or a tumor impinging on your nerves.  Your are being treated for possible TIA (mini stroke) with Eliquis.  If your symptoms change significantly or he develop new symptoms that concern you, please return to the nearest emergency Department.  Otherwise please continue taking all of your regular medications and follow-up with your doctor and neurologist as scheduled.

## 2015-11-25 ENCOUNTER — Telehealth: Payer: Self-pay | Admitting: Cardiovascular Disease

## 2015-11-25 NOTE — Telephone Encounter (Addendum)
Patient's daughter calling. Patient has been complaining of numbness in his left arm and has been seen in ED recently. Patient's daughter believes that this is due to patient's eliquis. Patient's daughter is stating patient is going to stop taking his eliquis, because he is frustrated about not being able to use his left arm. Patient stated the numbness started after he was on eliquis. Informed daughter that Eliqius is probably not the cause of her father's numbness in his left arm. Informed daughter of CT results from ED. Informed patient's daughter that patient needs to take eliquis for his A. FIB. Informed patient's daughter that Dr. [name] would have to change patient to something else for A. FIB to prevent a stroke. According to patient's daughter, Patient is suppose to be following up with neuro in November. Informed daughter that patient might need to see neuro sooner than later. Patient's daughter is very upset and wants answers. Patient's daughter wants to talk to a doctor and no one else. Will forward to Dr. [name].   Daughter Antony Haste can be reached tomorrow at [phone].

## 2015-11-25 NOTE — Telephone Encounter (Signed)
New message       Pt c/o medication issue:  1. Name of Medication: eliquis 2. How are you currently taking this medication (dosage and times per day)? '5mg'$  bid  3. Are you having a reaction (difficulty breathing--STAT)? no 4. What is your medication issue?  Since being on eliquis, pt has been having numbness and paralysis in left arm.  Could this be coming from the eliquis?

## 2015-11-26 MED ORDER — CLOPIDOGREL BISULFATE 75 MG PO TABS: 75.0000 mg | ORAL_TABLET | Freq: Every day | ORAL | 11 refills | Status: DC

## 2015-11-26 NOTE — Telephone Encounter (Signed)
I spoke with pt's daughter to see if pt needed Clopidogrel sent to pharmacy. Daughter reports pt has stopped Eliquis and is taking ASA and Clopidogrel. She does not need new prescription sent in for Clopidogrel and stated she would let us know when new prescription needed. Daughter aware of appt with Cecilie Kicks, NP on 9/13

## 2015-11-26 NOTE — Telephone Encounter (Signed)
I spoke to his daughter for 20 minutes today. He is refusing to take Eliquis. He refuses to consider coumadin. We discussed his left arm numbness. He has had two recent ED visits with same complaints. CT head ok. CT neck with no disc disease. I do not think his left arm issues are cardiac related. He has an appt next week to see primary care. He needs Neuro f/u. I will see him next week. Resume Plavix and continue ASA.   Lauree Chandler

## 2015-11-30 NOTE — Discharge Summary (Signed)
[facility] NAME: [name]    MR#:  [id number]  DATE OF BIRTH:  08/17/1940  DATE OF ADMISSION:  11/19/2015 ADMITTING PHYSICIAN: Harvie Bridge, DO  DATE OF DISCHARGE: 11/20/2015  4:59 PM  PRIMARY CARE PHYSICIAN: Loura Pardon, MD   ADMISSION DIAGNOSIS:  Transient cerebral ischemia, unspecified transient cerebral ischemia type [G45.9]  DISCHARGE DIAGNOSIS:  Active Problems:   TIA (transient ischemic attack)   Malnutrition of moderate degree   SECONDARY DIAGNOSIS:   Past Medical History:  Diagnosis Date  . AAA (abdominal aortic aneurysm) (HCC)    4.2 cm IR AAA noted on 01/13/15 PET scan  . Anxiety   . CAD (coronary artery disease)    AMI 1997 w/ BMS x 2 LAD, BMS CFX 2002, DES x 2 LAD 2009, DES CFX & OM 2012   . Carotid artery disease ([place])    RICA CEA 4098, LICA CEA 1191 per Dr. [name],   . CHF (congestive heart failure) ([place])    pt denied in 2014  . COPD (chronic obstructive pulmonary disease) ([place])   . GERD (gastroesophageal reflux disease)    occ  . HTN (hypertension)   . Hydradenitis   . Hyperlipidemia   . Ischemic cardiomyopathy    EF 40-45 percent at cath 2012  . Myocardial infarction ([place])   . Non-small cell lung cancer (NSCLC) ([place]) dx'd 12/2014   SBRT  . Osteoarthritis    pt. denies 01/17/2015  . PAC (premature atrial contraction)   . Peripheral vascular disease ([place])   . Pneumonia   . Prostate cancer ([place]) dx'd approx 2008   xrt throughfoley  . Radiation 02/18/15-02/25/15   right upper lung 54 Gy  . Shortness of breath dyspnea   . Stroke [facility]) 11-24-11   "no feeling in right 5th digit"  . Urinary frequency   . Urinary urgency      ADMITTING HISTORY  HISTORY OF PRESENT ILLNESS: [name] is a 75 y.o. male with a known history of Coronary artery disease, recent new onset of atrial flutter, lung cancer with metastases, rib fracture status post fall was in a usual state of health until  2-3 days ago when he describes weakness, numbness and tingling of his left upper extremity. He states that his symptoms have been gradually improving over the last 2 days to this point where his left fifth digit is the only part of his body that is affected. He also reports some left buttock numbness [place]. Otherwise he denies any focal neurologic deficits such as slurred speech, vision changes, gait instability.  Of note patient's daughter states that 2 nights ago he took double the dose of his evening medications erroneously. Also of note patient has a recent hospital admission for new onset atrial flutter with cardioversion.  Otherwise there has been no change in status. There has been no recent change in medication or diet.  There has been no recent illness, travel or sick contacts.    Patient denies fevers/chills, dizziness, chest pain, shortness of breath, N/V/C/D, abdominal pain, dysuria/frequency, changes in mental status.   HOSPITAL COURSE:   * Left upper extremity numbness and weakness Symptoms improved well during his hospital stay. Initial concern was for stroke. CT scan of the head showed nothing acute. MRI couldn't be done as patient refused this due to claustrophobia. Patient was seen by neurology Dr. Doy Mince recommended a CT scan of the cervical spine which patient preferred to  do as outpatient. Nothing acute at time of discharge with mild numbness of the left arm. His weakness has resolved. Patient is being discharged home to get a CT of his cervical spine with his primary care physician. Discussed with family prior to discharge.  Patient is on Eliquis for his recently diagnosed atrial flutter.  Other comorbidities and medications remain the same.  CONSULTS OBTAINED:  Treatment Team:  Alexis Goodell, MD  DRUG ALLERGIES:   Allergies  Allergen Reactions  . Augmentin [Amoxicillin-Pot Clavulanate] Diarrhea, Nausea And Vomiting and Other (See Comments)     Has patient had a PCN reaction causing immediate rash, facial/tongue/throat swelling, SOB or lightheadedness with hypotension: No Has patient had a PCN reaction causing severe rash involving mucus membranes or skin necrosis: No Has patient had a PCN reaction that required hospitalization No Has patient had a PCN reaction occurring within the last 10 years: Yes If all of the above answers are "NO", then may proceed with Cephalosporin use.   . Chantix [Varenicline]     Violent nightmares  . Sulfonamide Derivatives Other (See Comments)    Pt do not remember 8-30 md  . Zantac [Ranitidine Hcl] Diarrhea    DISCHARGE MEDICATIONS:   Discharge Medication List as of 11/20/2015  4:39 PM    START taking these medications   Details  pravastatin (PRAVACHOL) 10 MG tablet Take 1 tablet (10 mg total) by mouth daily., Starting Thu 11/20/2015, Normal      CONTINUE these medications which have NOT CHANGED   Details  acetaminophen (TYLENOL) 500 MG tablet Take 650 mg by mouth every 4 (four) hours as needed for mild pain, moderate pain, fever or headache. , Starting Fri 10/17/2015, Historical Med    albuterol (PROVENTIL HFA;VENTOLIN HFA) 108 (90 Base) MCG/ACT inhaler Inhale 2 puffs into the lungs every 4 (four) hours as needed for wheezing or shortness of breath., Historical Med    ALPRAZolam (XANAX) 0.5 MG tablet Take 0.5 mg by mouth at bedtime as needed for anxiety or sleep., Historical Med    aspirin EC 81 MG tablet Take 81 mg by mouth at bedtime. , Historical Med    ezetimibe (ZETIA) 10 MG tablet Take 1 tablet (10 mg total) by mouth daily., Starting Sat 11/01/2015, Normal    Fluticasone-Salmeterol (ADVAIR DISKUS) 500-50 MCG/DOSE AEPB Inhale 1 puff into the lungs 2 (two) times daily., Starting Fri 06/28/2014, Until Tue 04/27/2016, Sample    ipratropium-albuterol (DUONEB) 0.5-2.5 (3) MG/3ML SOLN Take 3 mLs by nebulization every 4 (four) hours as needed (shortness of breath and wheezing)., Historical Med     loratadine (CLARITIN) 10 MG tablet Take 10 mg by mouth daily., Historical Med    metoprolol tartrate (LOPRESSOR) 12.5 mg TABS tablet Take 12.5 mg by mouth 2 (two) times daily., Historical Med    Multiple Vitamin (MULTIVITAMIN WITH MINERALS) TABS tablet Take 1 tablet by mouth daily., Historical Med    nitroGLYCERIN (NITROSTAT) 0.4 MG SL tablet Place 0.4 mg under the tongue every 5 (five) minutes as needed for chest pain., Historical Med    oxyCODONE (OXY IR/ROXICODONE) 5 MG immediate release tablet Take 5 mg by mouth 2 (two) times daily as needed for severe pain., Historical Med    Tamsulosin HCl (FLOMAX) 0.4 MG CAPS Take 0.4 mg by mouth at bedtime. , Historical Med    tiotropium (SPIRIVA) 18 MCG inhalation capsule Place 18 mcg into inhaler and inhale daily., Historical Med    apixaban (ELIQUIS) 5 MG TABS tablet Take 1 tablet (5  mg total) by mouth 2 (two) times daily., Starting Sat 11/01/2015, Print    pregabalin (LYRICA) 25 MG capsule Take 1 capsule (25 mg total) by mouth 2 (two) times daily., Starting Fri 10/24/2015, Print      STOP taking these medications     OxyCODONE HCl, Abuse Deter, (OXAYDO) 5 MG TABA         Today   VITAL SIGNS:  Blood pressure (!) 117/57, pulse 74, temperature 97.5 F (36.4 C), temperature source Oral, resp. rate 18, height '5\' 8"'$  (1.727 m), weight 71.5 kg (157 lb 11.2 oz), SpO2 96 %.  I/O:  No intake or output data in the 24 hours ending 11/30/15 1222  PHYSICAL EXAMINATION:  Physical Exam  GENERAL:  75 y.o.-year-old patient lying in the bed with no acute distress.  LUNGS: Normal breath sounds bilaterally, no wheezing, rales,rhonchi or crepitation. No use of accessory muscles of respiration.  CARDIOVASCULAR: S1, S2 normal. No murmurs, rubs, or gallops.  ABDOMEN: Soft, non-tender, non-distended. Bowel sounds present. No organomegaly or mass.  NEUROLOGIC: Moves all 4 extremities. PSYCHIATRIC: The patient is alert and oriented x 3.  SKIN: No obvious  rash, lesion, or ulcer.   DATA REVIEW:   CBC No results for input(s): WBC, HGB, HCT, PLT in the last 168 hours.  Chemistries  No results for input(s): NA, K, CL, CO2, GLUCOSE, BUN, CREATININE, CALCIUM, MG, AST, ALT, ALKPHOS, BILITOT in the last 168 hours.  Invalid input(s): GFRCGP  Cardiac Enzymes No results for input(s): TROPONINI in the last 168 hours.  Microbiology Results  Results for orders placed or performed during the hospital encounter of 10/30/15  MRSA PCR Screening     Status: None   Collection Time: 10/30/15  2:50 PM  Result Value Ref Range Status   MRSA by PCR NEGATIVE NEGATIVE Final    Comment:        The GeneXpert MRSA Assay (FDA approved for NASAL specimens only), is one component of a comprehensive MRSA colonization surveillance program. It is not intended to diagnose MRSA infection nor to guide or monitor treatment for MRSA infections.     RADIOLOGY:  No results found.  Follow up with PCP in 1 week.  Management plans discussed with the patient, family and they are in agreement.  CODE STATUS:  Code Status History    Date Active Date Inactive Code Status Order ID Comments User Context   11/20/2015 12:33 AM 11/20/2015  7:59 PM Full Code [id number]  Harvie Bridge, DO Inpatient   10/30/2015  2:53 PM 11/01/2015  5:14 PM Full Code [id number]  Thurnell Lose, MD Inpatient   11/24/2011  3:30 PM 11/27/2011  1:13 PM Full Code [id number]  Dawn Olegario Messier, RN Inpatient    Advance Directive Documentation   Flowsheet Row Most Recent Value  Type of Advance Directive  Healthcare Power of Attorney  Pre-existing out of facility DNR order (yellow form or pink MOST form)  No data  "MOST" Form in Place?  No data      TOTAL TIME TAKING CARE OF THIS PATIENT ON DAY OF DISCHARGE: more than 30 minutes.   Hillary Bow R M.D on 11/30/2015 at 12:22 PM  Between 7am to 6pm - Pager - [phone]  After 6pm go to [email or web address] - password [email or web address]  [facility]  Office  [phone]  CC: Primary care physician; Loura Pardon, MD  Note: This dictation was prepared with Dragon dictation along with smaller phrase technology. Any transcriptional errors that result from  this process are unintentional.

## 2015-12-01 ENCOUNTER — Ambulatory Visit (INDEPENDENT_AMBULATORY_CARE_PROVIDER_SITE_OTHER): Payer: Medicare Other | Admitting: Family Medicine

## 2015-12-01 ENCOUNTER — Encounter: Payer: Self-pay | Admitting: Family Medicine

## 2015-12-01 ENCOUNTER — Ambulatory Visit (INDEPENDENT_AMBULATORY_CARE_PROVIDER_SITE_OTHER)
Admission: RE | Admit: 2015-12-01 | Discharge: 2015-12-01 | Disposition: A | Discharge status: Home / Self Care | Payer: Medicare Other | Source: Ambulatory Visit | Attending: Family Medicine | Admitting: Family Medicine

## 2015-12-01 VITALS — BP 144/72 | HR 88 | Temp 97.6°F | Ht 68.5 in | Wt 151.2 lb

## 2015-12-01 DIAGNOSIS — G459 Transient cerebral ischemic attack, unspecified: Secondary | ICD-10-CM

## 2015-12-01 DIAGNOSIS — J90 Pleural effusion, not elsewhere classified: Secondary | ICD-10-CM | POA: Diagnosis not present

## 2015-12-01 DIAGNOSIS — Z720 Tobacco use: Secondary | ICD-10-CM

## 2015-12-01 DIAGNOSIS — E440 Moderate protein-calorie malnutrition: Secondary | ICD-10-CM

## 2015-12-01 DIAGNOSIS — R059 Cough, unspecified: Secondary | ICD-10-CM

## 2015-12-01 DIAGNOSIS — R05 Cough: Secondary | ICD-10-CM

## 2015-12-01 DIAGNOSIS — F32A Depression, unspecified: Secondary | ICD-10-CM

## 2015-12-01 DIAGNOSIS — R202 Paresthesia of skin: Secondary | ICD-10-CM | POA: Insufficient documentation

## 2015-12-01 DIAGNOSIS — F329 Major depressive disorder, single episode, unspecified: Secondary | ICD-10-CM

## 2015-12-01 DIAGNOSIS — J439 Emphysema, unspecified: Secondary | ICD-10-CM | POA: Diagnosis not present

## 2015-12-01 DIAGNOSIS — R5381 Other malaise: Secondary | ICD-10-CM

## 2015-12-01 DIAGNOSIS — I10 Essential (primary) hypertension: Secondary | ICD-10-CM

## 2015-12-01 MED ORDER — MIRTAZAPINE 15 MG PO TABS: 15.0000 mg | ORAL_TABLET | Freq: Every day | ORAL | 5 refills | Status: DC

## 2015-12-01 NOTE — Assessment & Plan Note (Signed)
Unclear if this is reason for L arm paresthesias  Refuses to do MRI in light of severe claustrophobia Hospital records and studies and labs reviewed in detail with pt and family  Also rev more recent ED note  Ref for neuro- to see if we can get him in earlier than nov

## 2015-12-01 NOTE — Progress Notes (Signed)
Subjective:    Patient ID: [name], male    DOB: 1940-07-24, 75 y.o.   MRN: [id number]  HPI Pt here for f/u of hosp  Was hospitalized 8/30-8/31 for TIA He presented with weakness/numbness and tingling of LUE This improved Prev a flutter and cardioversion Of note- 2 nights previously he doubled evening meds by mistake  CT of head was nl  MRA could not be done in light of severe claustrophobia  CT of the neck was recommended as an outpt bp control stable  BP Readings from Last 3 Encounters:  12/01/15 (!) 144/72  11/21/15 (!) 157/75  11/20/15 (!) 117/57   He then returned to ED on 9/1-similar symptoms  Did CT of CS with contrast - was re assuring   Then changed eliquis to plavix and asa   Dg Chest 2 View  Result Date: 11/20/2015 CLINICAL DATA:  Transient ischemic attack, recent rib fracture EXAM: CHEST  2 VIEW COMPARISON:  Chest x-ray of 11/10/2015 and CT chest of 10/27/2015 FINDINGS: There is little change in volume of the small right pleural effusion with mild right basilar volume loss. The left lung is grossly clear. The small nodules noted by CT chest are not as well seen by chest x-ray. No pneumothorax is seen. Fractures of the right lateral eighth, ninth, and tenth ribs are visible. The heart is within normal limits in size. IMPRESSION: 1. No change in the volume of the small right pleural effusion with right basilar atelectasis. 2. Acute fractures of the right posterolateral eighth, ninth, and tenth ribs are visible. No pneumothorax. Electronically Signed   By: Ivar Drape M.D.   On: 11/20/2015 09:06   Dg Chest 2 View  Result Date: 11/11/2015 CLINICAL DATA:  Recent fall with rib fractures and cough, initial encounter EXAM: CHEST  2 VIEW COMPARISON:  10/30/2015 FINDINGS: Cardiac shadow is stable. The lungs are well aerated bilaterally. Persistent right pleural effusion is noted. Blunting of left costophrenic angle is noted as well. Stable right rib fractures are seen. No  pneumothorax is noted. IMPRESSION: Stable right pleural effusion.  Tiny left pleural effusion is noted. Right rib fractures without complicating factors. Electronically Signed   By: Inez Catalina M.D.   On: 11/11/2015 08:07   Ct Head Wo Contrast  Result Date: 11/21/2015 CLINICAL DATA:  Left arm numbness. EXAM: CT HEAD WITHOUT CONTRAST CT CERVICAL SPINE WITHOUT CONTRAST TECHNIQUE: Multidetector CT imaging of the head and cervical spine was performed following the standard protocol without intravenous contrast. Multiplanar CT image reconstructions of the cervical spine were also generated. COMPARISON:  CT scan of November 19, 2015. FINDINGS: CT HEAD FINDINGS Bony calvarium appears intact. Mild diffuse cortical atrophy is noted. Mild chronic ischemic white matter disease is noted. No mass effect or midline shift is noted. Ventricular size is within normal limits. There is no evidence of mass lesion, hemorrhage or acute infarction. CT CERVICAL SPINE FINDINGS No fracture or spondylolisthesis is noted. Mild degenerative disc disease is noted at C5-6 and C6-7. Degenerative changes seen involving the right-sided posterior facet joints. Patient appears to be status post bilateral carotid endarterectomy. Scarring is noted in both lung apices. IMPRESSION: Mild diffuse cortical atrophy. Mild chronic ischemic white matter disease. No acute intracranial abnormality seen. Multilevel degenerative disc disease. No acute abnormality seen in the cervical spine. Electronically Signed   By: Marijo Conception, M.D.   On: 11/21/2015 16:08   Ct Head Wo Contrast  Result Date: 11/19/2015 CLINICAL DATA:  Left arm  numbness for 3 days EXAM: CT HEAD WITHOUT CONTRAST TECHNIQUE: Contiguous axial images were obtained from the base of the skull through the vertex without intravenous contrast. COMPARISON:  11/26/2011 FINDINGS: Global atrophy appropriate to age. Minimal chronic ischemic changes in the periventricular white matter. No mass effect,  midline shift, or acute hemorrhage. IMPRESSION: No acute intracranial pathology. Electronically Signed   By: Marybelle Killings M.D.   On: 11/19/2015 20:41   Ct Cervical Spine Wo Contrast  Result Date: 11/21/2015 CLINICAL DATA:  Left arm numbness. EXAM: CT HEAD WITHOUT CONTRAST CT CERVICAL SPINE WITHOUT CONTRAST TECHNIQUE: Multidetector CT imaging of the head and cervical spine was performed following the standard protocol without intravenous contrast. Multiplanar CT image reconstructions of the cervical spine were also generated. COMPARISON:  CT scan of November 19, 2015. FINDINGS: CT HEAD FINDINGS Bony calvarium appears intact. Mild diffuse cortical atrophy is noted. Mild chronic ischemic white matter disease is noted. No mass effect or midline shift is noted. Ventricular size is within normal limits. There is no evidence of mass lesion, hemorrhage or acute infarction. CT CERVICAL SPINE FINDINGS No fracture or spondylolisthesis is noted. Mild degenerative disc disease is noted at C5-6 and C6-7. Degenerative changes seen involving the right-sided posterior facet joints. Patient appears to be status post bilateral carotid endarterectomy. Scarring is noted in both lung apices. IMPRESSION: Mild diffuse cortical atrophy. Mild chronic ischemic white matter disease. No acute intracranial abnormality seen. Multilevel degenerative disc disease. No acute abnormality seen in the cervical spine. Electronically Signed   By: Marijo Conception, M.D.   On: 11/21/2015 16:08   Ct Cervical Spine W Contrast  Result Date: 11/21/2015 CLINICAL DATA:  LEFT arm numbness, assess for metastasis in cervical spine causing LEFT upper extremity paresthesia. EXAM: CT CERVICAL SPINE WITH CONTRAST TECHNIQUE: Multidetector CT imaging of the cervical spine was performed during intravenous contrast administration. Multiplanar CT image reconstructions were also generated. CONTRAST:  43m ISOVUE-300 IOPAMIDOL (ISOVUE-300) INJECTION 61% COMPARISON:  CT  cervical spine November 21, 2015 at 1546 hours FINDINGS: ALIGNMENT: Cervical vertebral bodies in alignment. Straightened cervical lordosis. OSSEOUS STRUCTURES: Cervical vertebral bodies and posterior elements are intact. Moderate C5-6 disc height loss, mild at C3-4 and C6-7. No destructive bony lesions. C1-2 articulation with moderate arthropathy. No abnormal osseous or intradiscal enhancement though limited evaluation on CT. Facet arthropathy and uncovertebral hypertrophy results in moderate RIGHT C4-5, moderate to severe RIGHT C5-6 and moderate LEFT C5-6 neural foraminal narrowing. No canal stenosis. SOFT TISSUES: Included prevertebral and paraspinal soft tissues are normal. Normal normal enhancement though limited assessment by CT. RIGHT pleural effusion. Apical bullous changes and centrilobular emphysema. Biapical scarring. Patulous bilateral carotid bulbs with RIGHT intimal thickening. Probable bilateral carotid endarterectomy. Small LEFT laryngocele. IMPRESSION: No acute fracture or malalignment. No CT findings of metastasis though, MRI with contrast is more sensitive. Electronically Signed   By: CElon AlasM.D.   On: 11/21/2015 16:32   [place]ot[place] Bilateral (at [facility] And [facility] Only)  Result Date: 11/20/2015 CLINICAL DATA:  75year old male with a history of TIA. Cardiovascular risk factors include stroke/TIA, hyperlipidemia, known coronary disease, tobacco use. Given history of bilateral carotid endarterectomy. EXAM: BILATERAL CAROTID DUPLEX ULTRASOUND TECHNIQUE: GPearline Cablesscale imaging, color Doppler and duplex ultrasound were performed of bilateral carotid and vertebral arteries in the neck. COMPARISON:  No prior duplex FINDINGS: Criteria: Quantification of carotid stenosis is based on velocity parameters that correlate the residual internal carotid diameter with NASCET-based stenosis levels, using the diameter of the distal internal carotid  lumen as the denominator for stenosis measurement. The following  velocity measurements were obtained: RIGHT ICA:  Systolic 62 cm/sec, Diastolic 17 cm/sec CCA:  77 cm/sec SYSTOLIC ICA/CCA RATIO:  0.8 ECA:  71 cm/sec LEFT ICA:  Systolic 65 cm/sec, Diastolic 20 cm/sec CCA:  81 cm/sec SYSTOLIC ICA/CCA RATIO:  0.8 ECA:  74 cm/sec Right Brachial SBP: Not acquired Left Brachial SBP: Not acquired RIGHT CAROTID ARTERY: No significant calcifications of the right common carotid artery. Intermediate waveform maintained. Heterogeneous and partially calcified plaque/intimal hyperplasia at the right carotid bifurcation. No significant lumen shadowing. Low resistance waveform of the right ICA. No significant tortuosity. RIGHT VERTEBRAL ARTERY: Antegrade flow with low resistance waveform. LEFT CAROTID ARTERY: No significant calcifications of the left common carotid artery. Intermediate waveform maintained. Heterogeneous and partially calcified plaque/intimal hyperplasia at the left carotid bifurcation without significant lumen shadowing. Low resistance waveform of the left ICA. No significant tortuosity. LEFT VERTEBRAL ARTERY:  Antegrade flow with low resistance waveform. IMPRESSION: Note that the patient is reported to be status post bilateral endarterectomy, and established carotid duplex criteria have not been validated for these postoperative patients. Bilateral internal carotid artery maintain patency, without definite evidence of recurrent stenosis at the bifurcation. Signed, Dulcy Fanny. Earleen Newport, DO [facility] Electronically Signed   By: Corrie Mckusick D.O.   On: 11/20/2015 09:58   Dg Chest Portable 1 View  Result Date: 11/19/2015 CLINICAL DATA:  Left arm numbness 2 to 3 times a day for the past week, mostly while sitting. EXAM: PORTABLE CHEST 1 VIEW COMPARISON:  Chest 11/10/2015.  CT chest 10/27/2015. FINDINGS: Normal heart size and pulmonary vascularity. Surgical clips in the right upper lung. Right pleural effusion and mild  basilar atelectasis similar to previous study. Focal area of nodular scarring demonstrated in the right upper lung corresponding to a cavitary lesions seen on previous chest CT. No change since the prior radiographs. Additional pulmonary nodules demonstrated at CT are not visualized radiographically. Diffuse emphysematous changes in the lungs. No focal consolidation or airspace disease. Mediastinal contours appear intact. Tortuous aorta. Degenerative changes in the shoulders. Old right rib fractures. IMPRESSION: No acute changes since previous study. Nodular scarring and pleural effusion again demonstrated on the right without change. Emphysematous changes in the lungs. Electronically Signed   By: Lucienne Capers M.D.   On: 11/19/2015 20:23    Pt thinks the eliquis caused his numbness - was almost sure of it  Now improved off of it -making progress   Will start PT on Wednesday  Also f/u cardiologist Wednesday   Appetite is still poor  Still loosing weight  Sees pulm /oncol later this month    Now has a cough- a hard time expelling mucous  More of a hack than his regular cough  Dr Lamonte Sakai not aware    Showing some signs of depression and worse anxiety Not sleeping very well  No appetite Anxious  Wants to try medication No time for counseling right now  Difficult to motivate   Patient Active Problem List   Diagnosis Date Noted  . Paresthesia of arm 12/01/2015  . Depression 12/01/2015  . Malnutrition of moderate degree 11/20/2015  . TIA (transient ischemic attack) 11/19/2015  . Weakness 11/17/2015  . Physical deconditioning 11/10/2015  . Multiple rib fractures 11/10/2015  . Chronic systolic heart failure ([place]) 11/02/2015  . Atrial flutter with rapid ventricular response ([place]) 10/30/2015  . Atrial flutter ([place]) 10/30/2015  . B12 deficiency 07/22/2015  . Anemia  07/15/2015  . Hematuria 07/08/2015  . Unintentional weight loss 07/08/2015  . Orthostatic hypotension 07/08/2015  . 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 Cough 05/22/2015  . Tobacco user 05/22/2015  . Mediastinal lymphadenopathy 01/22/2015  . 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 S/P bronchoscopy with biopsy   . Colon cancer screening 11/06/2014  . Encounter for Medicare annual wellness exam 11/06/2014  . Routine general medical examination at a health care facility 11/06/2014  . Lung nodule 06/28/2014  . Carotid stenosis 12/20/2013  . Aftercare following surgery of the circulatory system 12/20/2013  . Aftercare following surgery of the circulatory system, [place] 12/19/2012  . Personal history of colonic polyps 08/28/2012  . Hyperglycemia 08/07/2012  . Pre-operative cardiovascular examination 11/03/2011  . Carotid artery disease ([place]) 05/20/2011  . Grief reaction 05/07/2011  . Ischemic cardiomyopathy 01/13/2011  . Palpitations 11/10/2010  . Occlusion and stenosis of carotid artery without mention of cerebral infarction 04/02/2010  . Anxiety disorder 12/12/2009  . HYPERTENSION, BENIGN 09/29/2008  . History of prostate cancer 09/19/2008  . Hyperlipidemia 02/10/2007  . AMAUROSIS FUGAX 02/10/2007  . MYOCARDIAL INFARCTION, HX OF 02/10/2007  . Coronary atherosclerosis 02/10/2007  . PERIPHERAL VASCULAR DISEASE 02/10/2007  . HEMORRHOIDS 02/10/2007  . Chronic obstructive pulmonary disease ([place]) 02/10/2007  . OSTEOARTHRITIS 02/10/2007  . Nicotine dependence 02/10/2007  . Pure hypercholesterolemia 01/27/2007  . CORONARY ATHEROSCLEROSIS NATIVE CORONARY ARTERY 01/27/2007   Past Medical History:  Diagnosis Date  . AAA (abdominal aortic aneurysm) (HCC)    4.2 cm IR AAA noted on 01/13/15 PET scan  . Anxiety   . CAD (coronary artery disease)    AMI 1997 w/ BMS x 2 LAD, BMS CFX 2002, DES x 2 LAD 2009, DES CFX & OM 2012   . Carotid artery disease ([place])    RICA CEA 0093, LICA CEA 8182 per Dr. [name],   . CHF (congestive heart failure) ([place])    pt denied in 2014  . COPD (chronic obstructive pulmonary  disease) ([place])   . GERD (gastroesophageal reflux disease)    occ  . HTN (hypertension)   . Hydradenitis   . Hyperlipidemia   . Ischemic cardiomyopathy    EF 40-45 percent at cath 2012  . Myocardial infarction ([place])   . Non-small cell lung cancer (NSCLC) ([place]) dx'd 12/2014   SBRT  . Osteoarthritis    pt. denies 01/17/2015  . PAC (premature atrial contraction)   . Peripheral vascular disease ([place])   . Pneumonia   . Prostate cancer ([place]) dx'd approx 2008   xrt throughfoley  . Radiation 02/18/15-02/25/15   right upper lung 54 Gy  . Shortness of breath dyspnea   . Stroke [facility]) 11-24-11   "no feeling in right 5th digit"  . Urinary frequency   . Urinary urgency    Past Surgical History:  Procedure Laterality Date  . CARDIAC CATHETERIZATION    . CARDIOVASCULAR STRESS TEST  12-2006  . CARDIOVERSION N/A 10/31/2015   Procedure: CARDIOVERSION;  Surgeon: Sueanne Margarita, MD;  Location: [place];  Service: Cardiovascular;  Laterality: N/A;  . CAROTID ENDARTERECTOMY Right 03-2000   CE  . CAROTID ENDARTERECTOMY Left 11-24-11   CE  . COLONOSCOPY  04-2001   polyp  . [place], 2002, 2009, 2012   8 stents total  . [place]   neg  . ENDARTERECTOMY  11/24/2011   Procedure: ENDARTERECTOMY CAROTID;  Surgeon: Rosetta Posner, MD;  Location: [place];  Service: Vascular;  Laterality: Left;  . EYE SURGERY Bilateral Aug. 17, 2015   Cataract  . FUDUCIAL PLACEMENT N/A 12/25/2014   Procedure: PLACEMENT OF FUDUCIAL;  Surgeon: Collene Gobble, MD;  Location: [facility];  Service: Thoracic;  Laterality: N/A;  . PROSTATE SURGERY     Radiation Tx  X's 40  . TEE WITHOUT CARDIOVERSION N/A 10/31/2015   Procedure: TRANSESOPHAGEAL ECHOCARDIOGRAM (TEE);  Surgeon: Sueanne Margarita, MD;  Location: [facility] ENDOSCOPY;  Service: Cardiovascular;  Laterality: N/A;  . VASCULAR SURGERY    . VIDEO BRONCHOSCOPY WITH ENDOBRONCHIAL NAVIGATION N/A 12/25/2014   Procedure: VIDEO BRONCHOSCOPY WITH  ENDOBRONCHIAL NAVIGATION  with Biopsies and Brushings;  Surgeon: Collene Gobble, MD;  Location: [facility];  Service: Thoracic;  Laterality: N/A;  . VIDEO BRONCHOSCOPY WITH ENDOBRONCHIAL ULTRASOUND N/A 01/22/2015   Procedure: VIDEO BRONCHOSCOPY WITH ENDOBRONCHIAL ULTRASOUND with Biopsies;  Surgeon: Collene Gobble, MD;  Location: [place] [facility];  Service: Thoracic;  Laterality: N/A;   Social History  Substance Use Topics  . Smoking status: Current Every Day Smoker    Packs/day: 1.00    Years: 55.00    Types: Cigarettes  . Smokeless tobacco: Never Used     Comment: 1/2 ppd (01/07/15)  . Alcohol use 0.0 oz/week     Comment: occ   Family History  Problem Relation Age of Onset  . Stroke Father   . Heart disease Father   . Heart disease Mother     pacemaker  . Other Brother     benign brain tumor  . Cancer Brother     Eye-behind  . Cancer Sister     Breast  . Liver cancer Brother   . Diabetes Maternal Grandmother    Allergies  Allergen Reactions  . Augmentin [Amoxicillin-Pot Clavulanate] Diarrhea, Nausea And Vomiting and Other (See Comments)    Has patient had a PCN reaction causing immediate rash, facial/tongue/throat swelling, SOB or lightheadedness with hypotension: No Has patient had a PCN reaction causing severe rash involving mucus membranes or skin necrosis: No Has patient had a PCN reaction that required hospitalization No Has patient had a PCN reaction occurring within the last 10 years: Yes If all of the above answers are "NO", then may proceed with Cephalosporin use.   . Chantix [Varenicline]     Violent nightmares  . Sulfonamide Derivatives Other (See Comments)    Pt do not remember 8-30 md  . Zantac [Ranitidine Hcl] Diarrhea   Current Outpatient Prescriptions on File Prior to Visit  Medication Sig Dispense Refill  . acetaminophen (TYLENOL) 500 MG tablet Take 1,000 mg by mouth every 4 (four) hours as needed for mild pain, moderate pain, fever or headache.     . albuterol  (PROVENTIL HFA;VENTOLIN HFA) 108 (90 Base) MCG/ACT inhaler Inhale 2 puffs into the lungs every 4 (four) hours as needed for wheezing or shortness of breath.    . ALPRAZolam (XANAX) 0.5 MG tablet Take 0.5 mg by mouth at bedtime as needed for anxiety or sleep.    Marland Kitchen aspirin EC 81 MG tablet Take 81 mg by mouth at bedtime.     . clopidogrel (PLAVIX) 75 MG tablet Take 1 tablet (75 mg total) by mouth daily. 30 tablet 11  . ezetimibe (ZETIA) 10 MG tablet Take 1 tablet (10 mg total) by mouth daily. 30 tablet 0  . Fluticasone-Salmeterol (ADVAIR DISKUS) 500-50 MCG/DOSE AEPB Inhale 1 puff into the lungs 2 (two) times daily. 3 each 0  . ipratropium-albuterol (DUONEB) 0.5-2.5 (3)  MG/3ML SOLN Take 3 mLs by nebulization every 4 (four) hours as needed (shortness of breath and wheezing).    Marland Kitchen loratadine (CLARITIN) 10 MG tablet Take 10 mg by mouth daily.    . metoprolol tartrate (LOPRESSOR) 12.5 mg TABS tablet Take 12.5 mg by mouth 2 (two) times daily.    . Multiple Vitamin (MULTIVITAMIN WITH MINERALS) TABS tablet Take 1 tablet by mouth daily.    . nitroGLYCERIN (NITROSTAT) 0.4 MG SL tablet Place 0.4 mg under the tongue every 5 (five) minutes as needed for chest pain.    Marland Kitchen oxyCODONE (OXY IR/ROXICODONE) 5 MG immediate release tablet Take 5 mg by mouth 2 (two) times daily as needed for severe pain.    . Tamsulosin HCl (FLOMAX) 0.4 MG CAPS Take 0.4 mg by mouth at bedtime.     Marland Kitchen tiotropium (SPIRIVA) 18 MCG inhalation capsule Place 18 mcg into inhaler and inhale daily.     No current facility-administered medications on file prior to visit.      Review of Systems Review of Systems  Constitutional: pos for fatigue/gen malaise and poor appetite  Eyes: Negative for pain and visual disturbance.  Respiratory: pos for cough and shortness of breath.   Cardiovascular: Negative for cp or palpitations    Gastrointestinal: Negative for nausea, diarrhea and constipation.  Genitourinary: Negative for urgency and frequency.    Skin: Negative for pallor or rash   Neurological: Negative for , light-headedness, numbness and headaches. pos for LUE parethesia Hematological: Negative for adenopathy. Does not bruise/bleed easily.  Psychiatric/Behavioral: pos for dysphoric and anxious mood/ neg for SI         Objective:   Physical Exam  Constitutional: He appears well-developed and well-nourished. No distress.  Frail appearing elderly male who appears malnourished   HENT:  Head: Normocephalic and atraumatic.  Mouth/Throat: Oropharynx is clear and moist.  Eyes: Conjunctivae and EOM are normal. Pupils are equal, round, and reactive to light.  Neck: Normal range of motion. Neck supple. No JVD present. Carotid bruit is not present. No thyromegaly present.  Cardiovascular: Normal rate, regular rhythm, normal heart sounds and intact distal pulses.  Exam reveals no gallop.   Pulmonary/Chest: Effort normal and breath sounds normal. No respiratory distress. He has no wheezes. He has no rales.  No crackles  Diffusely distant bs  No wheeze Upper airway sounds that clear with cough  Abdominal: Soft. Bowel sounds are normal. He exhibits no distension, no abdominal bruit and no mass. There is no tenderness.  Musculoskeletal: He exhibits no edema, tenderness or deformity.  Lymphadenopathy:    He has no cervical adenopathy.  Neurological: He is alert. He has normal reflexes. A sensory deficit is present. No cranial nerve deficit. He exhibits normal muscle tone. Coordination normal.  Abnormal sens to lt touch in L hand only   Nl /symmetric grip and strength and rom  Nl temp sensation  Skin: Skin is warm and dry. No rash noted.  Psychiatric: He has a normal mood and affect.          Assessment & Plan:   Problem List Items Addressed This Visit      Cardiovascular and Mediastinum   TIA (transient ischemic attack)    Unclear if this is reason for L arm paresthesias  Refuses to do MRI in light of severe  claustrophobia Hospital records and studies and labs reviewed in detail with pt and family  Also rev more recent ED note  Ref for neuro- to see if we  can get him in earlier than nov      HYPERTENSION, BENIGN    bp is stable Disc goals for control in light of TIA        Other   Tobacco user - Primary    Disc in detail risks of smoking and possible outcomes including copd, vascular/ heart disease, cancer , respiratory and sinus infections  Pt voices understanding Again-pt refuses to quit despite development of PVD/ TIA/HTN/lung cancer and copd       Physical deconditioning    Now out of the hospital again-he will try to start PT  Not ready to return to work      Paresthesia of arm    Rev hospital records and studies incl CT of head and CS Will ref to neuro  Almost resolved Pt blames eliquis (? Not a side eff I know of) -and went back to plavix and asa  Need to work on smoking cessation and bp  Reassuring exam today       Relevant Orders   Ambulatory referral to Neurology   Malnutrition of moderate degree    Multifactorial Lung cancer Trial of mirtazapine to help stimulate appetite      Depression    Family mentions this at the last minute Reviewed stressors/ coping techniques/symptoms/ support sources/ tx options and side effects in detail today  He has difficult to control anxiety and continues to smoke  Will try pm mirtazapine for this and appetite and sleep Discussed expectations of this medication including time to effectiveness and mechanism of action, also poss of side effects (early and late)- including mental fuzziness, weight or appetite change, nausea and poss of worse dep or anxiety (even suicidal thoughts)  Pt voiced understanding and will stop med and update if this occurs   Offered counseling referral      Relevant Medications   mirtazapine (REMERON) 15 MG tablet   Cough    Acute on chronic  Was in hospital twice-high risk for noscomial infx Also  smoker cxr today  Upper airway congestion noted on exam      Relevant Orders   DG Chest 2 View (Completed)    Other Visit Diagnoses   None.

## 2015-12-01 NOTE — Assessment & Plan Note (Signed)
Rev hospital records and studies incl CT of head and CS Will ref to neuro  Almost resolved Pt blames eliquis (? Not a side eff I know of) -and went back to plavix and asa  Need to work on smoking cessation and bp  Reassuring exam today

## 2015-12-01 NOTE — Patient Instructions (Addendum)
Continue current medicines Follow up with PT as planned Chest xray today to make sure there is nothing new going on  We will call you with results  Try the mirtazapine for mood/ sleep /appetite  If you feel worse on it or any intolerable side effects please let me know and stop it

## 2015-12-01 NOTE — Assessment & Plan Note (Signed)
Multifactorial Lung cancer Trial of mirtazapine to help stimulate appetite

## 2015-12-01 NOTE — Progress Notes (Signed)
Pre visit review using our clinic review tool, if applicable. No additional management support is needed unless otherwise documented below in the visit note. 

## 2015-12-01 NOTE — Assessment & Plan Note (Signed)
Disc in detail risks of smoking and possible outcomes including copd, vascular/ heart disease, cancer , respiratory and sinus infections  Pt voices understanding Again-pt refuses to quit despite development of PVD/ TIA/HTN/lung cancer and copd

## 2015-12-01 NOTE — Assessment & Plan Note (Signed)
Family mentions this at the last minute Reviewed stressors/ coping techniques/symptoms/ support sources/ tx options and side effects in detail today  He has difficult to control anxiety and continues to smoke  Will try pm mirtazapine for this and appetite and sleep Discussed expectations of this medication including time to effectiveness and mechanism of action, also poss of side effects (early and late)- including mental fuzziness, weight or appetite change, nausea and poss of worse dep or anxiety (even suicidal thoughts)  Pt voiced understanding and will stop med and update if this occurs   Offered counseling referral

## 2015-12-01 NOTE — Assessment & Plan Note (Signed)
Acute on chronic  Was in hospital twice-high risk for noscomial infx Also smoker cxr today  Upper airway congestion noted on exam

## 2015-12-01 NOTE — Assessment & Plan Note (Signed)
Now out of the hospital again-he will try to start PT  Not ready to return to work

## 2015-12-01 NOTE — Assessment & Plan Note (Signed)
bp is stable Disc goals for control in light of TIA

## 2015-12-03 ENCOUNTER — Encounter: Payer: Self-pay | Admitting: Cardiology

## 2015-12-03 ENCOUNTER — Ambulatory Visit: Payer: Medicare Other | Attending: Family Medicine

## 2015-12-03 ENCOUNTER — Ambulatory Visit (INDEPENDENT_AMBULATORY_CARE_PROVIDER_SITE_OTHER): Payer: Medicare Other | Admitting: Cardiology

## 2015-12-03 VITALS — BP 118/66 | HR 84

## 2015-12-03 VITALS — BP 110/60 | HR 92 | Ht 68.0 in | Wt 149.1 lb

## 2015-12-03 DIAGNOSIS — M6281 Muscle weakness (generalized): Secondary | ICD-10-CM | POA: Diagnosis not present

## 2015-12-03 DIAGNOSIS — Z9181 History of falling: Secondary | ICD-10-CM | POA: Diagnosis not present

## 2015-12-03 DIAGNOSIS — E785 Hyperlipidemia, unspecified: Secondary | ICD-10-CM

## 2015-12-03 DIAGNOSIS — R531 Weakness: Secondary | ICD-10-CM | POA: Diagnosis not present

## 2015-12-03 DIAGNOSIS — I4892 Unspecified atrial flutter: Secondary | ICD-10-CM | POA: Diagnosis not present

## 2015-12-03 DIAGNOSIS — I25811 Atherosclerosis of native coronary artery of transplanted heart without angina pectoris: Secondary | ICD-10-CM

## 2015-12-03 DIAGNOSIS — I6523 Occlusion and stenosis of bilateral carotid arteries: Secondary | ICD-10-CM

## 2015-12-03 DIAGNOSIS — G459 Transient cerebral ischemic attack, unspecified: Secondary | ICD-10-CM

## 2015-12-03 MED ORDER — EZETIMIBE 10 MG PO TABS: 10.0000 mg | ORAL_TABLET | Freq: Every day | ORAL | 0 refills | Status: DC

## 2015-12-03 NOTE — Therapy (Signed)
[facility] [address], [place], [address] Phone: [phone]   Fax:  [phone]  Physical Therapy Evaluation  Patient Details  Name: [name] MRN: [id number] Date of Birth: [birth year] Referring Provider: Abner Greenspan, MD  Encounter Date: 12/03/2015      PT End of Session - 12/03/15 0828    Visit Number 1   Number of Visits 9   Date for PT Re-Evaluation 01/01/16   Authorization Type 1   Authorization Time Period of 10   PT Start Time 0829   PT Stop Time 0937   PT Time Calculation (min) 68 min   Equipment Utilized During Treatment Gait belt   Activity Tolerance Patient tolerated treatment well   Behavior During Therapy [facility] for tasks assessed/performed      Past Medical History:  Diagnosis Date  . AAA (abdominal aortic aneurysm) (HCC)    4.2 cm IR AAA noted on 01/13/15 PET scan  . Anxiety   . CAD (coronary artery disease)    AMI 1997 w/ BMS x 2 LAD, BMS CFX 2002, DES x 2 LAD 2009, DES CFX & OM 2012   . Carotid artery disease ([place])    RICA CEA 3500, LICA CEA 9381 per Dr. [name],   . CHF (congestive heart failure) ([place])    pt denied in 2014  . COPD (chronic obstructive pulmonary disease) ([place])   . GERD (gastroesophageal reflux disease)    occ  . HTN (hypertension)   . Hydradenitis   . Hyperlipidemia   . Ischemic cardiomyopathy    EF 40-45 percent at cath 2012  . Myocardial infarction ([place])   . Non-small cell lung cancer (NSCLC) ([place]) dx'd 12/2014   SBRT  . Osteoarthritis    pt. denies 01/17/2015  . PAC (premature atrial contraction)   . Peripheral vascular disease ([place])   . Pneumonia   . Prostate cancer ([place]) dx'd approx 2008   xrt throughfoley  . Radiation 02/18/15-02/25/15   right upper lung 54 Gy  . Shortness of breath dyspnea   . Stroke [facility]) 11-24-11   "no feeling in right 5th digit"  . Urinary frequency   . Urinary urgency     Past Surgical History:  Procedure  Laterality Date  . CARDIAC CATHETERIZATION    . CARDIOVASCULAR STRESS TEST  12-2006  . CARDIOVERSION N/A 10/31/2015   Procedure: CARDIOVERSION;  Surgeon: Sueanne Margarita, MD;  Location: [facility];  Service: Cardiovascular;  Laterality: N/A;  . CAROTID ENDARTERECTOMY Right 03-2000   CE  . CAROTID ENDARTERECTOMY Left 11-24-11   CE  . COLONOSCOPY  04-2001   polyp  . [place], 2002, 2009, 2012   8 stents total  . [place]   neg  . ENDARTERECTOMY  11/24/2011   Procedure: ENDARTERECTOMY CAROTID;  Surgeon: Rosetta Posner, MD;  Location: [place];  Service: Vascular;  Laterality: Left;  . EYE SURGERY Bilateral Aug. 17, 2015   Cataract  . FUDUCIAL PLACEMENT N/A 12/25/2014   Procedure: PLACEMENT OF FUDUCIAL;  Surgeon: Collene Gobble, MD;  Location: [place];  Service: Thoracic;  Laterality: N/A;  . PROSTATE SURGERY     Radiation Tx  X's 40  . TEE WITHOUT CARDIOVERSION N/A 10/31/2015   Procedure: TRANSESOPHAGEAL ECHOCARDIOGRAM (TEE);  Surgeon: Sueanne Margarita, MD;  [facility] END[facility]COPY;  Service: Cardiovascular;  Laterality: N/A;  . VASCULAR SURGERY    . VIDEO BRONCHOSCOPY WITH ENDOBRONCHIAL NAVIGATION  N/A 12/25/2014   Procedure: VIDEO BRONCHOSCOPY WITH ENDOBRONCHIAL NAVIGATION  with Biopsies and Brushings;  Surgeon: Collene Gobble, MD;  Location: [place];  Service: Thoracic;  Laterality: N/A;  . VIDEO BRONCHOSCOPY WITH ENDOBRONCHIAL ULTRASOUND N/A 01/22/2015   Procedure: VIDEO BRONCHOSCOPY WITH ENDOBRONCHIAL ULTRASOUND with Biopsies;  Surgeon: Collene Gobble, MD;  Location: [facility];  Service: Thoracic;  Laterality: N/A;    Vitals:   12/03/15 0833  BP: 118/66  Pulse: 84         Subjective Assessment - 12/03/15 0833    Subjective R ribs: 1/10 currently (pt sitting); 4/10 at worst for the past month  Prostate CA 10 years ago, treated. Sees oncologist regularly   Pertinent History (P)  R rib fractures x5 and hemothorax. Injury occured 10/14/2015. Pt was up using  the restroom at 3 am, blacked out, and fell. Went to the hospital, was transferred to [facility] trauma center. Right now, ribs feel ok.  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  Works 12 hours a day, 2x a week or 3x a week. Had an MD appointment 12/01/2015 and had x-rays which showed rib fracture healing and hemothorax. Pt states that his MD said that he does not have pneumonia. Pt also states feeling numbness in his L hand, starting at his 5th digit which travels to his thumb and hand. Had 4 CT scans since July 2017 for his head, cervical spine, and chest area which rulled out a stroke. Just getting use back in his L hand. His MD also mentioned possible occupational therapy to get his motor skills more on point.  Pt states that he has fallen before after using the the bathroom. Pt MDs at [facility] concludes that urinating causes his falls.   HTN controlled per pt. CA is treated and decreasing.    Patient Stated Goals "Get back to work. "    Currently in Pain? Yes   Pain Score 1    Pain Location Rib cage   Pain Orientation Right   Pain Descriptors / Indicators Dull   Pain Type Acute pain   Aggravating Factors  walking a lot (2 blocks), moving his R arm at times, being up on his feet for any extended period of time.    Pain Relieving Factors Tylenol (does not help). Was using oxycodone but does not like the way it makes him feel.             [facility] PT Assessment - 12/03/15 0845      Assessment   Medical Diagnosis Mutiple R rib fractures with routine healing; weakness.    Referring Provider Abner Greenspan, MD   Onset Date/Surgical Date 10/14/15   Prior Therapy No known physical therapy for current condition.      Precautions   Precaution Comments Possible fall risk; AAA, hx of CA     Balance Screen   Has the patient fallen in the past 6 months Yes   How many times? 2  after urinating; 10/14/15 and 6 weeks prior   Has the patient had a  decrease in activity level because of a fear of falling?  No   Is the patient reluctant to leave their home because of a fear of falling?  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   Vocation Requirements PLOF: better able to ambulate longer distances, stand for longer periods, move his R arm     Posture/Postural Control   Posture Comments R lateral shift, decreased stance L LE, bilateral genu varus, decreased bilateral hip extension     Strength   Right Hip Flexion 4+/5   Right Hip External Rotation  4+/5   Right Hip ABduction 5/5  supine clamshell position   Left Hip Flexion 4/5   Left Hip External Rotation 4/5   Left Hip ABduction 5/5  supine clamshell position   Right Knee Flexion 4/5   Right Knee Extension 5/5   Left Knee Flexion 4/5   Left Knee Extension 5/5   Right Ankle Dorsiflexion 4/5   Right Ankle Plantar Flexion 4+/5   Left Ankle Dorsiflexion 4+/5   Left Ankle Plantar Flexion 4+/5     Ambulation/Gait   Gait Comments Decreased stance L LE, R lateral lean during R LE stance phase, decreased bilateral hip extension     Berg Balance Test   Sit to Stand Able to stand without using hands and stabilize independently   Standing Unsupported Able to stand safely 2 minutes   Sitting with Back Unsupported but Feet Supported on Floor or Stool Able to sit safely and securely 2 minutes   Stand to Sit Sits safely with minimal use of hands   Transfers Able to transfer safely, minor use of hands   Standing Unsupported with Eyes Closed Able to stand 10 seconds safely   Standing Ubsupported with Feet Together Able to place feet together independently and stand 1 minute safely   From Standing, Reach Forward with Outstretched Arm Can reach forward >12 cm safely (5")   From Standing Position, Pick up Object from Floor Able to  pick up shoe safely and easily   From Standing Position, Turn to Look Behind Over each Shoulder Looks behind from both sides and weight shifts well   Turn 360 Degrees Able to turn 360 degrees safely but slowly   Standing Unsupported, Alternately Place Feet on Step/Stool Able to stand independently and safely and complete 8 steps in 20 seconds   Standing Unsupported, One Foot in Front Able to plae foot ahead of the other independently and hold 30 seconds   Standing on One Leg Able to lift leg independently and hold equal to or more than 3 seconds   Total Score 50   Berg comment: score > or = 46/56 suggests decreased fall risk and decreased need for assistive device  Pt also demonstrates fatigue, needing sitting rest breaks.            Objectives  There-ex  Directed patient with seated ankle DF/PF 20x  Seated knee extension 10x 5 seconds each LE    Standing heel raises with bilateral UE assist 10x3   Reviewed HEP. Please see patient instructions. Pt demonstrated and verbalized understanding.      Improved exercise technique, movement at target joints, use of target muscles after mod verbal, visual, tactile cues.                 PT Education - 12/03/15 2032    Education provided Yes   Education Details ther-ex, HEP, plan of care   Person(s) Educated Patient   Methods Explanation;Demonstration;Tactile cues;Verbal cues;Handout   Comprehension Returned demonstration;Verbalized understanding             PT Long Term Goals - 12/03/15 2035  PT LONG TERM GOAL #1   Title Patient will improve bilateral LE strength by at least 1/2 MMT grade to promote ability to perform standing tasks and decrease fall risk.    Time 4   Period Weeks   Status New     PT LONG TERM GOAL #2   Title Patient will be able to ambulate at least 4 blocks to promote mobility, and ability to perform job related tasks.    Baseline Pt able to ambulate 2 blocks   Time 4   Period Weeks    Status New               Plan - 12/03/15 June 24, 2031    Clinical Impression Statement Patient is a 75 year old male who came to physical therapy secondary to R rib fractures, and weakness. He also presents with altered gait pattern and posture, LE weakness, decreased endurance, and difficulty performing functional tasks such as walking longer distances. Patient will benefit from skilled physical therapy services to address the aforementioned deficits.    Rehab Potential Good   Clinical Impairments Affecting Rehab Potential healing time   PT Frequency 2x / week   PT Duration 4 weeks   PT Treatment/Interventions Manual techniques;Therapeutic exercise;Balance training;Neuromuscular re-education;Patient/family education;Therapeutic activities;Functional mobility training;Gait training   PT Next Visit Plan LE strengthening   Consulted and Agree with Plan of Care Patient      Patient will benefit from skilled therapeutic intervention in order to improve the following deficits and impairments:  Pain, Decreased strength, Difficulty walking  Visit Diagnosis: Muscle weakness (generalized) - Plan: PT plan of care cert/re-cert  History of falling - Plan: PT plan of care cert/re-cert      G-Codes - 53/66/44 23-Jun-2040    Functional Assessment Tool Used BERG, patient interview, clinical presentation   Functional Limitation Mobility: Walking and moving around   Mobility: Walking and Moving Around Current Status [phone]) At least 1 percent but less than 20 percent impaired, limited or restricted   Mobility: Walking and Moving Around Goal Status [phone]) 0 percent impaired, limited or restricted       Problem List Patient Active Problem List   Diagnosis Date Noted  . Paresthesia of arm 12/01/2015  . Depression 12/01/2015  . Malnutrition of moderate degree 11/20/2015  . TIA (transient ischemic attack) 11/19/2015  . Weakness 11/17/2015  . Physical deconditioning 11/10/2015  . Multiple rib fractures  11/10/2015  . Chronic systolic heart failure ([place]) 11/02/2015  . Atrial flutter with rapid ventricular response ([place]) 10/30/2015  . Atrial flutter ([place]) 10/30/2015  . B12 deficiency 07/22/2015  . Anemia 07/15/2015  . Hematuria 07/08/2015  . Unintentional weight loss 07/08/2015  . Orthostatic hypotension 07/08/2015  . 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 Cough 05/22/2015  . Tobacco user 05/22/2015  . Mediastinal lymphadenopathy 01/22/2015  . 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 S/P bronchoscopy with biopsy   . Colon cancer screening 11/06/2014  . Encounter for Medicare annual wellness exam 11/06/2014  . Routine general medical examination at a health care facility 11/06/2014  . Lung nodule 06/28/2014  . Carotid stenosis 12/20/2013  . Aftercare following surgery of the circulatory system 12/20/2013  . Aftercare following surgery of the circulatory system, [place] 12/19/2012  . Personal history of colonic polyps 08/28/2012  . Hyperglycemia 08/07/2012  . Pre-operative cardiovascular examination 11/03/2011  . Carotid artery disease ([place]) 05/20/2011  . Grief reaction 05/07/2011  . Ischemic cardiomyopathy 01/13/2011  . Palpitations 11/10/2010  .  Occlusion and stenosis of carotid artery without mention of cerebral infarction 04/02/2010  . Anxiety disorder 12/12/2009  . HYPERTENSION, BENIGN 09/29/2008  . History of prostate cancer 09/19/2008  . Hyperlipidemia 02/10/2007  . AMAUROSIS FUGAX 02/10/2007  . MYOCARDIAL INFARCTION, HX OF 02/10/2007  . Coronary atherosclerosis 02/10/2007  . PERIPHERAL VASCULAR DISEASE 02/10/2007  . HEMORRHOIDS 02/10/2007  . Chronic obstructive pulmonary disease ([place]) 02/10/2007  . OSTEOARTHRITIS 02/10/2007  . Nicotine dependence 02/10/2007  . Pure hypercholesterolemia 01/27/2007  . CORONARY ATHEROSCLEROSIS NATIVE CORONARY ARTERY 01/27/2007     Thank you for your referral.  Joneen Boers PT, DPT   12/03/2015, 8:47 PM  [facility] [address], [place], [address] Phone: [phone]   Fax:  [phone]  Name: [name] MRN: [id number] Date of Birth: [birth year]

## 2015-12-03 NOTE — Patient Instructions (Signed)
  Sitting:   Bend your ankles up and down 30 times    Straighten your leg and hold for 5 seconds. Repeat 10 times. Perform 2 sets.      Standing and holding onto something sturdy:    Perform tip toes 10 times for 3 sets daily.

## 2015-12-03 NOTE — Progress Notes (Signed)
Cardiology Office Note   Date:  12/04/2015   ID:  [name], DOB [birth year], MRN [id number]  PCP:  Ian Pardon, MD  Cardiologist:  Dr. Angelena Rogers     Chief Complaint  Patient presents with  . Atrial Fibrillation      History of Present Illness: [name] is a 75 y.o. male who presents for post TIA and recent a flutter on Eliquis.  .   On ASA and Plavix.  Placed on IV dilt to control rate  His CHA2DS2VASc sore of 7.  plavix stopped and Eliquis started.   He underwent TEE with no clots and then with DCCV cardioverted to SR.     He has a history of coronary artery disease and abdominal aortic aneurysm. His last coronary intervention was in 2012 at which time he had a drug-eluting stent placed in his circumflex.  He is now on Zetia.  He is undergoing radiation for lung cancer.   His cardiac history is significant for anterior MI in 1997 tx'd with bare metal stent x 2 to the LAD, bare metal stenting to the CFX in 2002 and PCI in 2009 with placement of 2 separate drug eluting stents in the LAD. Cardiac cath on 04/14/10 with severe stenosis in the distal AV groove Circumflex and the OM. These were both treated with DES. His carotid artery dopplers 12/02/12 in [facility] office showed mild disease in RICA at CEA site and mild disease in the LICA CES site  Discussed with Dr. Angelena Rogers" I am not sure what do recommend here. If he refuses to pay for Eliquis and he refuses coumadin, I guess we have no other options than to to refer to EP and see if they can at least give him their opinion on ablation and also tell him what they think about his stroke risk etc off of anti-coagulation. "  Pt admitted 11/19/15 for TIA-while on Eliquis.  CT of head did not reveal acute process but pt refused MRI due to claustrophobia. He did have CT of cervical spine with no metastasis.   Since that time pt stopped the Eliquis.  Pt's daughter discussed with Dr. Angelena Rogers.  We cannot make pt take the Eliquis.  I again  discussed risk for stroke with the pt.  He is aware of risk but continues to decline anticoagulation.  He is back on ASA and plavix.      Today pt feels better than he did and denies chest pain or SOB. He  is attending rehab.  No chest pain.  Appetite still poor but does use boost.    Past Medical History:  Diagnosis Date  . AAA (abdominal aortic aneurysm) (HCC)    4.2 cm IR AAA noted on 01/13/15 PET scan  . Anxiety   . CAD (coronary artery disease)    AMI 1997 w/ BMS x 2 LAD, BMS CFX 2002, DES x 2 LAD 2009, DES CFX & OM 2012   . Carotid artery disease ([name])    RICA CEA 2025, LICA CEA 4270 per Dr. Donnetta Rogers,   . CHF (congestive heart failure) ([place])    pt denied in 2014  . COPD (chronic obstructive pulmonary disease) ([place])   . GERD (gastroesophageal reflux disease)    occ  . HTN (hypertension)   . Hydradenitis   . Hyperlipidemia   . Ischemic cardiomyopathy    EF 40-45 percent at cath 2012  . Myocardial infarction ([place])   . Non-small cell lung cancer (NSCLC) ([place]) dx'd  12/2014   SBRT  . Osteoarthritis    pt. denies 01/17/2015  . PAC (premature atrial contraction)   . Peripheral vascular disease ([place])   . Pneumonia   . Prostate cancer ([name]) dx'd approx 2008   xrt throughfoley  . Radiation 02/18/15-02/25/15   right upper lung 54 Gy  . Shortness of breath dyspnea   . Stroke [facility]) 11-24-11   "no feeling in right 5th digit"  . Urinary frequency   . Urinary urgency     Past Surgical History:  Procedure Laterality Date  . CARDIAC CATHETERIZATION    . CARDIOVASCULAR STRESS TEST  12-2006  . CARDIOVERSION N/A 10/31/2015   Procedure: CARDIOVERSION;  Surgeon: Ian Margarita, MD;  Location: [facility];  Service: Cardiovascular;  Laterality: N/A;  . CAROTID ENDARTERECTOMY Right 03-2000   CE  . CAROTID ENDARTERECTOMY Left 11-24-11   CE  . COLONOSCOPY  04-2001   polyp  . [place], 2002, 2009, 2012   8 stents total  . [place]   neg  .  ENDARTERECTOMY  11/24/2011   Procedure: ENDARTERECTOMY CAROTID;  Surgeon: Ian Posner, MD;  Location: [name];  Service: Vascular;  Laterality: Left;  . EYE SURGERY Bilateral Aug. 17, 2015   Cataract  . FUDUCIAL PLACEMENT N/A 12/25/2014   Procedure: PLACEMENT OF FUDUCIAL;  Surgeon: Ian Gobble, MD;  Location: [facility];  Service: Thoracic;  Laterality: N/A;  . PROSTATE SURGERY     Radiation Tx  X's 40  . TEE WITHOUT CARDIOVERSION N/A 10/31/2015   Procedure: TRANSESOPHAGEAL ECHOCARDIOGRAM (TEE);  Surgeon: Ian Margarita, MD;  Location: [facility] ENDOSCOPY;  Service: Cardiovascular;  Laterality: N/A;  . VASCULAR SURGERY    . VIDEO BRONCHOSCOPY WITH ENDOBRONCHIAL NAVIGATION N/A 12/25/2014   Procedure: VIDEO BRONCHOSCOPY WITH ENDOBRONCHIAL NAVIGATION  with Biopsies and Brushings;  Surgeon: Ian Gobble, MD;  Location: [facility];  Service: Thoracic;  Laterality: N/A;  . VIDEO BRONCHOSCOPY WITH ENDOBRONCHIAL ULTRASOUND N/A 01/22/2015   Procedure: VIDEO BRONCHOSCOPY WITH ENDOBRONCHIAL ULTRASOUND with Biopsies;  Surgeon: Ian Gobble, MD;  Location: [facility] OR;  Service: Thoracic;  Laterality: N/A;     Current Outpatient Prescriptions  Medication Sig Dispense Refill  . acetaminophen (TYLENOL) 500 MG tablet Take 1,000 mg by mouth every 4 (four) hours as needed for mild pain, moderate pain, fever or headache.     . albuterol (PROVENTIL HFA;VENTOLIN HFA) 108 (90 Base) MCG/ACT inhaler Inhale 2 puffs into the lungs every 4 (four) hours as needed for wheezing or shortness of breath.    . ALPRAZolam (XANAX) 0.5 MG tablet Take 0.5 mg by mouth at bedtime as needed for anxiety or sleep.    Marland Kitchen aspirin EC 81 MG tablet Take 81 mg by mouth at bedtime.     . clopidogrel (PLAVIX) 75 MG tablet Take 1 tablet (75 mg total) by mouth daily. 30 tablet 11  . ezetimibe (ZETIA) 10 MG tablet Take 1 tablet (10 mg total) by mouth daily. 30 tablet 0  . Fluticasone-Salmeterol (ADVAIR DISKUS) 500-50 MCG/DOSE AEPB Inhale 1 puff into the lungs 2 (two)  times daily. 3 each 0  . ipratropium-albuterol (DUONEB) 0.5-2.5 (3) MG/3ML SOLN Take 3 mLs by nebulization every 4 (four) hours as needed (shortness of breath and wheezing).    Marland Kitchen loratadine (CLARITIN) 10 MG tablet Take 10 mg by mouth daily.    . metoprolol tartrate (LOPRESSOR) 12.5 mg TABS tablet Take 12.5 mg by mouth 2 (two) times daily.    Marland Kitchen  mirtazapine (REMERON) 15 MG tablet Take 1 tablet (15 mg total) by mouth at bedtime. 30 tablet 5  . Multiple Vitamin (MULTIVITAMIN WITH MINERALS) TABS tablet Take 1 tablet by mouth daily.    . nitroGLYCERIN (NITROSTAT) 0.4 MG SL tablet Place 0.4 mg under the tongue every 5 (five) minutes as needed for chest pain.    Marland Kitchen oxyCODONE (OXY IR/ROXICODONE) 5 MG immediate release tablet Take 5 mg by mouth 2 (two) times daily as needed for severe pain.    . Tamsulosin HCl (FLOMAX) 0.4 MG CAPS Take 0.4 mg by mouth at bedtime.     Marland Kitchen tiotropium (SPIRIVA) 18 MCG inhalation capsule Place 18 mcg into inhaler and inhale daily.     No current facility-administered medications for this visit.     Allergies:   Augmentin [amoxicillin-pot clavulanate]; Chantix [varenicline]; Sulfonamide derivatives; and Zantac [ranitidine hcl]    Social History:  The patient  reports that he has been smoking Cigarettes.  He has a 55.00 pack-year smoking history. He has never used smokeless tobacco. He reports that he drinks alcohol. He reports that he does not use drugs.   Family History:  The patient's family history includes Cancer in his brother and sister; Diabetes in his maternal grandmother; Heart disease in his father and mother; Liver cancer in his brother; Other in his brother; Stroke in his father.    ROS:  General:no colds or fevers, no weight changes Skin:no rashes or ulcers HEENT:no blurred vision, no congestion CV:see HPI PUL:see HPI GI:no diarrhea constipation or melena, no indigestion GU:no hematuria, no dysuria MS:no joint pain, no claudication Neuro:no syncope, no  lightheadedness Endo:no diabetes, no thyroid disease Wt Readings from Last 3 Encounters:  12/03/15 149 lb 1.9 oz (67.6 kg)  12/01/15 151 lb 4 oz (68.6 kg)  11/21/15 157 lb (71.2 kg)     PHYSICAL EXAM: VS:  BP 110/60   Pulse 92   Ht '5\' 8"'$  (1.727 m)   Wt 149 lb 1.9 oz (67.6 kg)   SpO2 94%   BMI 22.67 kg/m  , BMI Body mass index is 22.67 kg/m. General:Pleasant affect, NAD Skin:Warm and dry, brisk capillary refill HEENT:normocephalic, sclera clear, mucus membranes moist Neck:supple, no JVD, no bruits  Heart:S1S2 RRR without murmur, gallup, rub or click Lungs:clear without rales, rhonchi, or wheezes IRS:WNIO, non tender, + BS, do not palpate liver spleen or masses Ext:no lower ext edema, 2+ pedal pulses, 2+ radial pulses Neuro:alert and oriented, MAE, follows commands, + facial symmetry    EKG:  EKG is NOT ordered today.    Recent Labs: 10/30/2015: TSH 5.114 11/10/2015: ALT 14 11/20/2015: Hemoglobin 12.8; Platelets 263 11/21/2015: BUN 14; Creatinine, Ser 0.83; Potassium 3.8; Sodium 134    Lipid Panel    Component Value Date/Time   CHOL 116 11/20/2015 0630   TRIG 65 11/20/2015 0630   HDL 51 11/20/2015 0630   CHOLHDL 2.3 11/20/2015 0630   VLDL 13 11/20/2015 0630   LDLCALC 52 11/20/2015 0630       Other studies Reviewed: Additional studies/ records that were reviewed today include: see previous note for TEE  Carotid dopplers 11/20/15 IMPRESSION: Note that the patient is reported to be status post bilateral endarterectomy, and established carotid duplex criteria have not been validated for these postoperative patients. Bilateral internal carotid artery maintain patency, without definite evidence of recurrent stenosis at the bifurcation  ASSESSMENT AND PLAN:  1.  PAF- no longer on Eliquis even though Neuro and cardiology have explained his risk of stroke.  He continues to decline.  back on ASA and Plavix  Maintaining SR Also gave option of seeing EP but pt not  interested currently - he has been through several things in brief period of time and may make other choices once he has had time to process his illnesses.   2.  TIA- though still with some Lt hand numbness improving with therapy.   3.  Snoring will need sleep study - will discuss on next visit.  4  CAD: Stable. No chest pain. He is on good medical therapy. If eliquis is stopped will need to resume plavix with the asa. Continue statin-on zetia now. and beta blocker.   5. Hypotension  Improved he is on 12.5 lopressor BID cannot do higher dose due to low BP, he recently fell and fx a rib so he is anxious back on BB.  May need to see EP for ablation.     6. Carotid artery disease: Stable s/p bilateral CEA. Followed by Dr. Donnetta [facility] in [facility].   6. Tobacco abuse: Smoking cessation encouraged. He is trying to stop smoking.  We disscused not smoking in the house or car.  He cannot take chantix.    7. Hyperlipidemia: pt had stopped his statin so he was discharged on zetia. LDL goal is below 70. He does not tolerate high doses of statins , LDL is 102 on recent check  Pt will follow up with Dr. Angelena Rogers in 2-3 months.  Pt's daughter was with him for visit.    Current medicines are reviewed with the patient today.  The patient Has no concerns regarding medicines.  The following changes have been made:  See above Labs/ tests ordered today include:see above  Disposition:   FU:  see above  Signed, Cecilie Kicks, NP  12/04/2015 8:27 PM    [address], [place], [place] Phone: [phone]; Fax: [phone]

## 2015-12-03 NOTE — Patient Instructions (Signed)
Medication Instructions:  Your physician recommends that you continue on your current medications as directed. Please refer to the Current Medication list given to you today.   Labwork: [place]  Testing/Procedures: None  Follow-Up: Dr. Camillia Herter nurse will call you to schedule a follow-up appointment in about 2 months.  Any Other Special Instructions Will Be Listed Below (If Applicable).     If you need a refill on your cardiac medications before your next appointment, please call your pharmacy.

## 2015-12-09 ENCOUNTER — Ambulatory Visit: Payer: Medicare Other

## 2015-12-09 DIAGNOSIS — Z9181 History of falling: Secondary | ICD-10-CM | POA: Diagnosis not present

## 2015-12-09 DIAGNOSIS — M6281 Muscle weakness (generalized): Secondary | ICD-10-CM

## 2015-12-09 NOTE — Therapy (Signed)
[facility] [address], [place], [address] Phone: [phone]   Fax:  [phone]  Physical Therapy Treatment  Patient Details  Name: [name] MRN: [id number] Date of Birth: [birth year] Referring Provider: Abner Greenspan, MD  Encounter Date: 12/09/2015      PT End of Session - 12/09/15 0809    Visit Number 2   Number of Visits 9   Date for PT Re-Evaluation 01/01/16   Authorization Type 2   Authorization Time Period of 10   PT Start Time 0810   PT Stop Time 0852   PT Time Calculation (min) 42 min   Equipment Utilized During Treatment --   Activity Tolerance Patient tolerated treatment well   Behavior During Therapy [facility] for tasks assessed/performed      Past Medical History:  Diagnosis Date  . AAA (abdominal aortic aneurysm) (HCC)    4.2 cm IR AAA noted on 01/13/15 PET scan  . Anxiety   . CAD (coronary artery disease)    AMI 1997 w/ BMS x 2 LAD, BMS CFX 2002, DES x 2 LAD 2009, DES CFX & OM 2012   . Carotid artery disease ([place])    RICA CEA 6073, LICA CEA 7106 per Dr. [name],   . CHF (congestive heart failure) ([place])    pt denied in 2014  . COPD (chronic obstructive pulmonary disease) ([place])   . GERD (gastroesophageal reflux disease)    occ  . HTN (hypertension)   . Hydradenitis   . Hyperlipidemia   . Ischemic cardiomyopathy    EF 40-45 percent at cath 2012  . Myocardial infarction ([place])   . Non-small cell lung cancer (NSCLC) ([place]) dx'd 12/2014   SBRT  . Osteoarthritis    pt. denies 01/17/2015  . PAC (premature atrial contraction)   . Peripheral vascular disease ([place])   . Pneumonia   . Prostate cancer ([place]) dx'd approx 2008   xrt throughfoley  . Radiation 02/18/15-02/25/15   right upper lung 54 Gy  . Shortness of breath dyspnea   . Stroke [facility]) 11-24-11   "no feeling in right 5th digit"  . Urinary frequency   . Urinary urgency     Past Surgical History:  Procedure Laterality Date   . CARDIAC CATHETERIZATION    . CARDIOVASCULAR STRESS TEST  12-2006  . CARDIOVERSION N/A 10/31/2015   Procedure: CARDIOVERSION;  Surgeon: Sueanne Margarita, MD;  Location: [facility];  Service: Cardiovascular;  Laterality: N/A;  . CAROTID ENDARTERECTOMY Right 03-2000   CE  . CAROTID ENDARTERECTOMY Left 11-24-11   CE  . COLONOSCOPY  04-2001   polyp  . [place], 2002, 2009, 2012   8 stents total  . [place]   neg  . ENDARTERECTOMY  11/24/2011   Procedure: ENDARTERECTOMY CAROTID;  Surgeon: Rosetta Posner, MD;  Location: [place] [facility];  Service: Vascular;  Laterality: Left;  . EYE SURGERY Bilateral Aug. 17, 2015   Cataract  . FUDUCIAL PLACEMENT N/A 12/25/2014   Procedure: PLACEMENT OF FUDUCIAL;  Surgeon: Collene Gobble, MD;  Location: [facility];  Service: Thoracic;  Laterality: N/A;  . PROSTATE SURGERY     Radiation Tx  X's 40  . TEE WITHOUT CARDIOVERSION N/A 10/31/2015   Procedure: TRANSESOPHAGEAL ECHOCARDIOGRAM (TEE);  Surgeon: Sueanne Margarita, MD;  Location: [facility] ENDOSCOPY;  Service: Cardiovascular;  Laterality: N/A;  . VASCULAR SURGERY    . VIDEO BRONCHOSCOPY WITH ENDOBRONCHIAL NAVIGATION N/A  12/25/2014   Procedure: VIDEO BRONCHOSCOPY WITH ENDOBRONCHIAL NAVIGATION  with Biopsies and Brushings;  Surgeon: Collene Gobble, MD;  Location: [facility];  Service: Thoracic;  Laterality: N/A;  . VIDEO BRONCHOSCOPY WITH ENDOBRONCHIAL ULTRASOUND N/A 01/22/2015   Procedure: VIDEO BRONCHOSCOPY WITH ENDOBRONCHIAL ULTRASOUND with Biopsies;  Surgeon: Collene Gobble, MD;  Location: [facility];  Service: Thoracic;  Laterality: N/A;    There were no vitals filed for this visit.      Subjective Assessment - 12/09/15 0812    Subjective R ribs pain. 0/10  currently. Kind of positional. No blacking out recently. Does the exercises before using the restroom in the middle of the night.  Pt also states not having full function in his L arm every morning and sometimes during the day.  Was hospitalized  for his L UE, had CT scans and X-rays and ruled out a blood clot. Also had A-fib. Had a procedure to shock it back to rythm.   Prostate CA 10 years ago, treated. Sees oncologist regularly   Pertinent History R rib fractures x5 and hemothorax. Injury occured 10/14/2015. Pt was up using the restroom at 3 am, blacked out, and fell. Went to the hospital, was transferred to [facility] trauma center. Right now, ribs feel ok.  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  Works 12 hours a day, 2x a week or 3x a week. Had an MD appointment 12/01/2015 and had x-rays which showed rib fracture healing and hemothorax. Pt states that his MD said that he does not have pneumonia. Pt also states feeling numbness in his L hand, starting at his 5th digit which travels to his thumb and hand. Had 4 CT scans since July 2017 for his head, cervical spine, and chest area which rulled out a stroke. Just getting use back in his L hand. His MD also mentioned possible occupational therapy to get his motor skills more on point.  Pt states that he has fallen before after using the the bathroom. Pt MDs at [facility] concludes that urinating causes his falls.   HTN controlled per pt. CA is treated and decreasing.    Patient Stated Goals "Get back to work. "    Currently in Pain? No/denies                                 PT Education - 12/09/15 0817    Education provided Yes   Education Details ther-ex, HEP   Person(s) Educated Patient   Methods Explanation;Demonstration;Tactile cues;Verbal cues;Handout   Comprehension Returned demonstration;Verbalized understanding       Objectives  There-ex  Directed patient with seated hip adduction pillow squeeze 10x3 with 5 second holds  Seated knee extension resisting 1.5 lbs each LE 8x2  Then no weight: 10x5 seconds  Standing heel raises with bilateral UE assist 10x3  BP R arm sitting, mechanically taken:  117/62, HR 87 bpm  SLS with opposite foot on stair step with glute max squeeze 10x2 with 5 second holds  Standing mini squats 10x2 with L UE assist  standing hip abduction 10x2 each LE with bilateral UE assist  Seated knee flexion resisting yellow band 10x2 each LE  Standing hip extension with bilateral UE assist 10x each LE    Improved exercise technique, movement at target joints, use of target muscles after mod verbal, visual, tactile cues.  Good muscle use felt by pt with exercises. No complain of pain. Worked in increasing LE strength/muscle tone and musculovenous pump to help pt be able to stand and use the bathroom in the middle of the night without falling.             PT Long Term Goals - 12/03/15 2035      PT LONG TERM GOAL #1   Title Patient will improve bilateral LE strength by at least 1/2 MMT grade to promote ability to perform standing tasks and decrease fall risk.    Time 4   Period Weeks   Status New     PT LONG TERM GOAL #2   Title Patient will be able to ambulate at least 4 blocks to promote mobility, and ability to perform job related tasks.    Baseline Pt able to ambulate 2 blocks   Time 4   Period Weeks   Status New               Plan - 12/09/15 9798    Clinical Impression Statement Good muscle use felt by pt with exercises. No complain of pain. Worked in increasing LE strength/muscle tone and musculovenous pump to help pt be able to stand and use the bathroom in the middle of the night without falling.    Rehab Potential Good   Clinical Impairments Affecting Rehab Potential healing time   PT Frequency 2x / week   PT Duration 4 weeks   PT Treatment/Interventions Manual techniques;Therapeutic exercise;Balance training;Neuromuscular re-education;Patient/family education;Therapeutic activities;Functional mobility training;Gait training   PT Next Visit Plan LE strengthening   Consulted and Agree with Plan of Care Patient      Patient  will benefit from skilled therapeutic intervention in order to improve the following deficits and impairments:  Pain, Decreased strength, Difficulty walking  Visit Diagnosis: Muscle weakness (generalized)  History of falling     Problem List Patient Active Problem List   Diagnosis Date Noted  . Paresthesia of arm 12/01/2015  . Depression 12/01/2015  . Malnutrition of moderate degree 11/20/2015  . TIA (transient ischemic attack) 11/19/2015  . Weakness 11/17/2015  . Physical deconditioning 11/10/2015  . Multiple rib fractures 11/10/2015  . Chronic systolic heart failure ([place]) 11/02/2015  . Atrial flutter with rapid ventricular response ([place]) 10/30/2015  . Atrial flutter ([place]) 10/30/2015  . B12 deficiency 07/22/2015  . Anemia 07/15/2015  . Hematuria 07/08/2015  . Unintentional weight loss 07/08/2015  . Orthostatic hypotension 07/08/2015  . 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 Cough 05/22/2015  . Tobacco user 05/22/2015  . Mediastinal lymphadenopathy 01/22/2015  . 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 S/P bronchoscopy with biopsy   . Colon cancer screening 11/06/2014  . Encounter for Medicare annual wellness exam 11/06/2014  . Routine general medical examination at a health care facility 11/06/2014  . Lung nodule 06/28/2014  . Carotid stenosis 12/20/2013  . Aftercare following surgery of the circulatory system 12/20/2013  . Aftercare following surgery of the circulatory system, [place] 12/19/2012  . Personal history of colonic polyps 08/28/2012  . Hyperglycemia 08/07/2012  . Pre-operative cardiovascular examination 11/03/2011  . Carotid artery disease ([place]) 05/20/2011  . Grief reaction 05/07/2011  . Ischemic cardiomyopathy 01/13/2011  . Palpitations 11/10/2010  . Occlusion and stenosis of carotid artery without mention of cerebral infarction 04/02/2010  . Anxiety disorder 12/12/2009  . HYPERTENSION, BENIGN 09/29/2008  . History of prostate cancer 09/19/2008   . Hyperlipidemia 02/10/2007  . AMAUROSIS  FUGAX 02/10/2007  . MYOCARDIAL INFARCTION, HX OF 02/10/2007  . Coronary atherosclerosis 02/10/2007  . PERIPHERAL VASCULAR DISEASE 02/10/2007  . HEMORRHOIDS 02/10/2007  . Chronic obstructive pulmonary disease ([place]) 02/10/2007  . OSTEOARTHRITIS 02/10/2007  . Nicotine dependence 02/10/2007  . Pure hypercholesterolemia 01/27/2007  . CORONARY ATHEROSCLEROSIS NATIVE CORONARY ARTERY 01/27/2007    Joneen Boers PT, DPT   12/09/2015, 8:56 AM  [facility] [address] [place], [address] Phone: [phone]   Fax:  [phone]  Name: KIRIL HIPPE MRN: [id number] Date of Birth: 12-10-1940

## 2015-12-09 NOTE — Patient Instructions (Signed)
Adduction: Hip - Knees Together (Sitting)    Sit with towel roll or pillow between knees. Squeeze rear-end and push knees together. Hold for _5__ seconds.  Repeat __10_ times. Do __3_ times a day.  Copyright  VHI. All rights reserved.

## 2015-12-11 ENCOUNTER — Ambulatory Visit: Payer: Medicare Other

## 2015-12-11 DIAGNOSIS — M6281 Muscle weakness (generalized): Secondary | ICD-10-CM | POA: Diagnosis not present

## 2015-12-11 DIAGNOSIS — Z9181 History of falling: Secondary | ICD-10-CM | POA: Diagnosis not present

## 2015-12-11 NOTE — Therapy (Signed)
[facility] [address], [place], [address] Phone: [phone]   Fax:  [phone]  Physical Therapy Treatment  Patient Details  Name: [name] MRN: [id number] Date of Birth: [birth year] Referring Provider: Abner Greenspan, MD  Encounter Date: 12/11/2015      PT End of Session - 12/11/15 0810    Visit Number 3   Number of Visits 9   Date for PT Re-Evaluation 01/01/16   Authorization Type 3   Authorization Time Period of 10   PT Start Time 0811   PT Stop Time [phone]   PT Time Calculation (min) 32 min   Activity Tolerance Patient tolerated treatment well   Behavior During Therapy [facility] for tasks assessed/performed      Past Medical History:  Diagnosis Date  . AAA (abdominal aortic aneurysm) (HCC)    4.2 cm IR AAA noted on 01/13/15 PET scan  . Anxiety   . CAD (coronary artery disease)    AMI 1997 w/ BMS x 2 LAD, BMS CFX 2002, DES x 2 LAD 2009, DES CFX & OM 2012   . Carotid artery disease ([place])    RICA CEA 9381, LICA CEA 8299 per Dr. [name],   . CHF (congestive heart failure) ([place])    pt denied in 2014  . COPD (chronic obstructive pulmonary disease) ([place])   . GERD (gastroesophageal reflux disease)    occ  . HTN (hypertension)   . Hydradenitis   . Hyperlipidemia   . Ischemic cardiomyopathy    EF 40-45 percent at cath 2012  . Myocardial infarction ([place])   . Non-small cell lung cancer (NSCLC) ([place]) dx'd 12/2014   SBRT  . Osteoarthritis    pt. denies 01/17/2015  . PAC (premature atrial contraction)   . Peripheral vascular disease ([place])   . Pneumonia   . Prostate cancer ([place]) dx'd approx 2008   xrt throughfoley  . Radiation 02/18/15-02/25/15   right upper lung 54 Gy  . Shortness of breath dyspnea   . Stroke [facility]) 11-24-11   "no feeling in right 5th digit"  . Urinary frequency   . Urinary urgency     Past Surgical History:  Procedure Laterality Date  . CARDIAC CATHETERIZATION    .  CARDIOVASCULAR STRESS TEST  12-2006  . CARDIOVERSION N/A 10/31/2015   Procedure: CARDIOVERSION;  Surgeon: Sueanne Margarita, MD;  Location: [facility];  Service: Cardiovascular;  Laterality: N/A;  . CAROTID ENDARTERECTOMY Right 03-2000   CE  . CAROTID ENDARTERECTOMY Left 11-24-11   CE  . COLONOSCOPY  04-2001   polyp  . [place], 2002, 2009, 2012   8 stents total  . [place]   neg  . ENDARTERECTOMY  11/24/2011   Procedure: ENDARTERECTOMY CAROTID;  Surgeon: Rosetta Posner, MD;  Location: [facility];  Service: Vascular;  Laterality: Left;  . EYE SURGERY Bilateral Aug. 17, 2015   Cataract  . FUDUCIAL PLACEMENT N/A 12/25/2014   Procedure: PLACEMENT OF FUDUCIAL;  Surgeon: Collene Gobble, MD;  Location: [facility];  Service: Thoracic;  Laterality: N/A;  . PROSTATE SURGERY     Radiation Tx  X's 40  . TEE WITHOUT CARDIOVERSION N/A 10/31/2015   Procedure: TRANSESOPHAGEAL ECHOCARDIOGRAM (TEE);  Surgeon: Sueanne Margarita, MD;  Location[facility] ENDOSCOPY;  Service: Cardiovascular;  Laterality: N/A;  . VASCULAR SURGERY    . VIDEO BRONCHOSCOPY WITH ENDOBRONCHIAL NAVIGATION N/A 12/25/2014   Procedure: VIDEO BRONCHOSCOPY WITH  ENDOBRONCHIAL NAVIGATION  with Biopsies and Brushings;  Surgeon: Collene Gobble, MD;  Location: [place];  Service: Thoracic;  Laterality: N/A;  . VIDEO BRONCHOSCOPY WITH ENDOBRONCHIAL ULTRASOUND N/A 01/22/2015   Procedure: VIDEO BRONCHOSCOPY WITH ENDOBRONCHIAL ULTRASOUND with Biopsies;  Surgeon: Collene Gobble, MD;  Location: [place] [facility];  Service: Thoracic;  Laterality: N/A;    There were no vitals filed for this visit.      Subjective Assessment - 12/11/15 0812    Subjective Not too bad today. The ribs are getting a little better. No rib pain currently. No falls recently.   Prostate CA 10 years ago, treated. Sees oncologist regularly   Pertinent History R rib fractures x5 and hemothorax. Injury occured 10/14/2015. Pt was up using the restroom at 3 am, blacked out, and  fell. Went to the hospital, was transferred to [facility] trauma center. Right now, ribs feel ok.  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  Works 12 hours a day, 2x a week or 3x a week. Had an MD appointment 12/01/2015 and had x-rays which showed rib fracture healing and hemothorax. Pt states that his MD said that he does not have pneumonia. Pt also states feeling numbness in his L hand, starting at his 5th digit which travels to his thumb and hand. Had 4 CT scans since July 2017 for his head, cervical spine, and chest area which rulled out a stroke. Just getting use back in his L hand. His MD also mentioned possible occupational therapy to get his motor skills more on point.  Pt states that he has fallen before after using the the bathroom. Pt MDs at [facility] concludes that urinating causes his falls.   HTN controlled per pt. CA is treated and decreasing.    Patient Stated Goals "Get back to work. "    Currently in Pain? No/denies   Pain Score 0-No pain                                 PT Education - 12/11/15 0814    Education provided Yes   Education Details ther-ex   Person(s) Educated Patient   Methods Explanation;Demonstration;Tactile cues;Verbal cues   Comprehension Returned demonstration;Verbalized understanding     Objectives  There-ex  Directed patient with standing heel raises 10x3 Standing mini squats with bilateral UE assist 10x2 seated hip adduction pillow squeeze 10x3 with 5 second holds SLS with opposite foot on stair step with glute max squeeze 10x3 with 5 second holds  Rest breaks provided secondary to fatigue. Pt also states not eating breakfast. No blood sugar problems. Pt was recommended to eat breakfast or at least a snack prior to PT so his body has fuel for exercises. Pt verbalized understanding.   Seated knee flexion resisting yellow band 10x2 each LE  standing hip abduction 10x each  LE with bilateral UE assist   Improved exercise technique, movement at target joints, use of target muscles after mod verbal, visual, tactile cues.     Light session performed today secondary to fatigue and pt not eating breakfast. Good LE muscle use felt with exercises. No complain of pain.              PT Long Term Goals - 12/03/15 2035      PT LONG TERM GOAL #1   Title Patient will improve bilateral LE strength by  at least 1/2 MMT grade to promote ability to perform standing tasks and decrease fall risk.    Time 4   Period Weeks   Status New     PT LONG TERM GOAL #2   Title Patient will be able to ambulate at least 4 blocks to promote mobility, and ability to perform job related tasks.    Baseline Pt able to ambulate 2 blocks   Time 4   Period Weeks   Status New               Plan - 12/11/15 0809    Clinical Impression Statement Light session performed today secondary to fatigue and pt not eating breakfast. Good LE muscle use felt with exercises. No complain of pain.    Rehab Potential Good   Clinical Impairments Affecting Rehab Potential healing time   PT Frequency 2x / week   PT Duration 4 weeks   PT Treatment/Interventions Manual techniques;Therapeutic exercise;Balance training;Neuromuscular re-education;Patient/family education;Therapeutic activities;Functional mobility training;Gait training   PT Next Visit Plan LE strengthening   Consulted and Agree with Plan of Care Patient      Patient will benefit from skilled therapeutic intervention in order to improve the following deficits and impairments:  Pain, Decreased strength, Difficulty walking  Visit Diagnosis: Muscle weakness (generalized)  History of falling     Problem List Patient Active Problem List   Diagnosis Date Noted  . Paresthesia of arm 12/01/2015  . Depression 12/01/2015  . Malnutrition of moderate degree 11/20/2015  . TIA (transient ischemic attack) 11/19/2015  . Weakness  11/17/2015  . Physical deconditioning 11/10/2015  . Multiple rib fractures 11/10/2015  . Chronic systolic heart failure ([place]) 11/02/2015  . Atrial flutter with rapid ventricular response ([place]) 10/30/2015  . Atrial flutter ([place]) 10/30/2015  . B12 deficiency 07/22/2015  . Anemia 07/15/2015  . Hematuria 07/08/2015  . Unintentional weight loss 07/08/2015  . Orthostatic hypotension 07/08/2015  . 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 Cough 05/22/2015  . Tobacco user 05/22/2015  . Mediastinal lymphadenopathy 01/22/2015  . 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 S/P bronchoscopy with biopsy   . Colon cancer screening 11/06/2014  . Encounter for Medicare annual wellness exam 11/06/2014  . Routine general medical examination at a health care facility 11/06/2014  . Lung nodule 06/28/2014  . Carotid stenosis 12/20/2013  . Aftercare following surgery of the circulatory system 12/20/2013  . Aftercare following surgery of the circulatory system, [place] 12/19/2012  . Personal history of colonic polyps 08/28/2012  . Hyperglycemia 08/07/2012  . Pre-operative cardiovascular examination 11/03/2011  . Carotid artery disease ([place]) 05/20/2011  . Grief reaction 05/07/2011  . Ischemic cardiomyopathy 01/13/2011  . Palpitations 11/10/2010  . Occlusion and stenosis of carotid artery without mention of cerebral infarction 04/02/2010  . Anxiety disorder 12/12/2009  . HYPERTENSION, BENIGN 09/29/2008  . History of prostate cancer 09/19/2008  . Hyperlipidemia 02/10/2007  . AMAUROSIS FUGAX 02/10/2007  . MYOCARDIAL INFARCTION, HX OF 02/10/2007  . Coronary atherosclerosis 02/10/2007  . PERIPHERAL VASCULAR DISEASE 02/10/2007  . HEMORRHOIDS 02/10/2007  . Chronic obstructive pulmonary disease ([place]) 02/10/2007  . OSTEOARTHRITIS 02/10/2007  . Nicotine dependence 02/10/2007  . Pure hypercholesterolemia 01/27/2007  . CORONARY ATHEROSCLEROSIS NATIVE CORONARY ARTERY 01/27/2007    Joneen Boers PT, DPT    12/11/2015, 8:47 AM  [facility] [address] [place], [address] Phone: [phone]   Fax:  [phone]  Name: [name]  MRN: [id number] Date of Birth: January 01, 1941

## 2015-12-15 ENCOUNTER — Ambulatory Visit: Payer: Medicare Other

## 2015-12-16 ENCOUNTER — Ambulatory Visit: Payer: Medicare Other

## 2015-12-19 ENCOUNTER — Ambulatory Visit (INDEPENDENT_AMBULATORY_CARE_PROVIDER_SITE_OTHER): Payer: Medicare Other | Admitting: Neurology

## 2015-12-19 ENCOUNTER — Encounter: Payer: Self-pay | Admitting: Neurology

## 2015-12-19 DIAGNOSIS — R202 Paresthesia of skin: Secondary | ICD-10-CM

## 2015-12-19 DIAGNOSIS — R29898 Other symptoms and signs involving the musculoskeletal system: Secondary | ICD-10-CM | POA: Diagnosis not present

## 2015-12-19 DIAGNOSIS — M25512 Pain in left shoulder: Secondary | ICD-10-CM | POA: Diagnosis not present

## 2015-12-19 DIAGNOSIS — Z9181 History of falling: Secondary | ICD-10-CM | POA: Diagnosis not present

## 2015-12-19 DIAGNOSIS — R200 Anesthesia of skin: Secondary | ICD-10-CM | POA: Diagnosis not present

## 2015-12-19 MED ORDER — GABAPENTIN 300 MG PO CAPS: 300.0000 mg | ORAL_CAPSULE | Freq: Three times a day (TID) | ORAL | 11 refills | Status: DC

## 2015-12-19 NOTE — Progress Notes (Signed)
[facility]  PATIENT: [name] DOB: [birth year]  REFERRING DOCTOR OR PCP:   Loura Pardon SOURCE: Patient, notes from Dr. Glori Bickers, imaging reports and CT scan images on PACS.  _________________________________   HISTORICAL  CHIEF COMPLAINT:  Chief Complaint  Patient presents with  . Numbness    PMS--cardiac disease, 8 stents, on Plavix. Lung cancer dx. 2 yrs. ago, completed radiation for same.  Sts. on 10-09-15, he had a syncopal episode in his bathroom at home.  Landed on his right side, on the tub and suffered mult. fx. ribs and a right sided pneumathorax.  He was treated at [facility], then transferred to [facility]  Sts. while there, BP med was d/c and Plavix was changed to Eliquis.  About 2 weeks later, he began having numbness, tingling in his left hand, forearm. Eliquis was d/c   . Extremity Weakness    and he was put back on Plavix, and numbness/tingling began to improve.  He is here today with continued c/o residual tingling left forearm, and weakness left hand./fim    HISTORY OF PRESENT ILLNESS:  I had the pleasure seeing you patient, [name], at [facility] for neurologic consultation regarding his left arm weakness and tingling.  He had syncope 10/09/15 in the bathroom, hitting the bath tub and fracturing 5 ribs.   Three years ago, he tripped and fell landing on his left outstretched arm and he has had some shoulder pain since.     During the hospital stay after his episode of syncope, in late July 2017,  he was switched from Plavix to Eliquis.  Shortly after that he noted numbness in the left arm.   He went to [facility] and was admitted.    A head CT showed no acute findings.  CT scan of the cervical spine showed multilevel degenerative changes.  Sinc that time, he has had reduced left arm strength, reduced grip and numbness/tingling in the dorsal forearm.    He has had some pain in the left shoulder but not in the  neck. Shoulder pain increases with elevation and external rotation.    He denies neck stiffness.   Changes in position do not affect strength or sensation.    Compared to last month, he feels he is stronger (especially with grip) but he is not at baseline.    He denies any leg numbness or weakness.     He notes no bladder changes.  He was recently switched back to Plavix from Eliquis.  CT scan of the head 11/21/15 Shows minimal cortical atrophy and mild small vessel ischemic changes that are chronic. The CT scan of the cervical spine shows degenerative changes maximal at C4-C5, C5-C6 and C6-C7 with foraminal narrowing on the right at [place] and mild left foraminal narrowing at C5C6.   He has had prior carotid endarterectomy surgery bilaterally.  He had carotid endarterectomy 4 years ago and had a reduced right hand strnegth due to a small stroke after the surgery.   Symptoms improved to just a small amound of 5th digit numbness.    He has lost 60 pounds over the past year due to loss of appetite.       REVIEW OF SYSTEMS: Constitutional: No fevers, chills, sweats, or change in appetite.   Notes fatigue Eyes: No visual changes, double vision, eye pain Ear, nose and throat: No hearing loss, ear pain, nasal congestion, sore throat Cardiovascular: No chest pain, palpitations Respiratory: Some shortness  of breath at times.    No wheezes GastrointestinaI: No nausea, vomiting, diarrhea, abdominal pain, fecal incontinence Genitourinary: No dysuria, urinary retention or frequency.  No nocturia. Musculoskeletal:as above Integumentary: No rash, pruritus, skin lesions Neurological: as above Psychiatric: No depression at this time.  No anxiety Endocrine: No palpitations, diaphoresis, change in appetite, change in weigh or increased thirst Hematologic/Lymphatic: No anemia, purpura, petechiae. Allergic/Immunologic: No itchy/runny eyes, nasal congestion, recent allergic reactions,  rashes  ALLERGIES: Allergies  Allergen Reactions  . Augmentin [Amoxicillin-Pot Clavulanate] Diarrhea, Nausea And Vomiting and Other (See Comments)    Has patient had a PCN reaction causing immediate rash, facial/tongue/throat swelling, SOB or lightheadedness with hypotension: No Has patient had a PCN reaction causing severe rash involving mucus membranes or skin necrosis: No Has patient had a PCN reaction that required hospitalization No Has patient had a PCN reaction occurring within the last 10 years: Yes If all of the above answers are "NO", then may proceed with Cephalosporin use.   . Chantix [Varenicline]     Violent nightmares  . Sulfonamide Derivatives Other (See Comments)    Pt do not remember 8-30 md  . Zantac [Ranitidine Hcl] Diarrhea    HOME MEDICATIONS:  Current Outpatient Prescriptions:  .  acetaminophen (TYLENOL) 500 MG tablet, Take 1,000 mg by mouth every 4 (four) hours as needed for mild pain, moderate pain, fever or headache. , Disp: , Rfl:  .  albuterol (PROVENTIL HFA;VENTOLIN HFA) 108 (90 Base) MCG/ACT inhaler, Inhale 2 puffs into the lungs every 4 (four) hours as needed for wheezing or shortness of breath., Disp: , Rfl:  .  ALPRAZolam (XANAX) 0.5 MG tablet, Take 0.5 mg by mouth at bedtime as needed for anxiety or sleep., Disp: , Rfl:  .  aspirin EC 81 MG tablet, Take 81 mg by mouth at bedtime. , Disp: , Rfl:  .  clopidogrel (PLAVIX) 75 MG tablet, Take 1 tablet (75 mg total) by mouth daily., Disp: 30 tablet, Rfl: 11 .  ezetimibe (ZETIA) 10 MG tablet, Take 1 tablet (10 mg total) by mouth daily., Disp: 30 tablet, Rfl: 0 .  Fluticasone-Salmeterol (ADVAIR DISKUS) 500-50 MCG/DOSE AEPB, Inhale 1 puff into the lungs 2 (two) times daily., Disp: 3 each, Rfl: 0 .  ipratropium-albuterol (DUONEB) 0.5-2.5 (3) MG/3ML SOLN, Take 3 mLs by nebulization every 4 (four) hours as needed (shortness of breath and wheezing)., Disp: , Rfl:  .  loratadine (CLARITIN) 10 MG tablet, Take 10 mg by  mouth daily., Disp: , Rfl:  .  metoprolol tartrate (LOPRESSOR) 12.5 mg TABS tablet, Take 12.5 mg by mouth 2 (two) times daily., Disp: , Rfl:  .  mirtazapine (REMERON) 15 MG tablet, Take 1 tablet (15 mg total) by mouth at bedtime., Disp: 30 tablet, Rfl: 5 .  Multiple Vitamin (MULTIVITAMIN WITH MINERALS) TABS tablet, Take 1 tablet by mouth daily., Disp: , Rfl:  .  nitroGLYCERIN (NITROSTAT) 0.4 MG SL tablet, Place 0.4 mg under the tongue every 5 (five) minutes as needed for chest pain., Disp: , Rfl:  .  Tamsulosin HCl (FLOMAX) 0.4 MG CAPS, Take 0.4 mg by mouth at bedtime. , Disp: , Rfl:  .  tiotropium (SPIRIVA) 18 MCG inhalation capsule, Place 18 mcg into inhaler and inhale daily., Disp: , Rfl:  .  gabapentin (NEURONTIN) 300 MG capsule, Take 1 capsule (300 mg total) by mouth 3 (three) times daily., Disp: 90 capsule, Rfl: 11 .  oxyCODONE (OXY IR/ROXICODONE) 5 MG immediate release tablet, Take 5 mg by mouth 2 (  two) times daily as needed for severe pain., Disp: , Rfl:   PAST MEDICAL HISTORY: Past Medical History:  Diagnosis Date  . AAA (abdominal aortic aneurysm) (HCC)    4.2 cm IR AAA noted on 01/13/15 PET scan  . Anxiety   . CAD (coronary artery disease)    AMI 1997 w/ BMS x 2 LAD, BMS CFX 2002, DES x 2 LAD 2009, DES CFX & OM 2012   . Carotid artery disease ([place])    RICA CEA 2229, LICA CEA 7989 per Dr. [name],   . CHF (congestive heart failure) ([place])    pt denied in 2014  . COPD (chronic obstructive pulmonary disease) ([place])   . GERD (gastroesophageal reflux disease)    occ  . HTN (hypertension)   . Hydradenitis   . Hyperlipidemia   . Ischemic cardiomyopathy    EF 40-45 percent at cath 2012  . Myocardial infarction ([place])   . Non-small cell lung cancer (NSCLC) ([place]) dx'd 12/2014   SBRT  . Osteoarthritis    pt. denies 01/17/2015  . PAC (premature atrial contraction)   . Peripheral vascular disease ([place])   . Pneumonia   . Prostate cancer ([place]) dx'd approx 2008   xrt throughfoley  .  Radiation 02/18/15-02/25/15   right upper lung 54 Gy  . Shortness of breath dyspnea   . Stroke [facility]) 11-24-11   "no feeling in right 5th digit"  . Urinary frequency   . Urinary urgency     PAST SURGICAL HISTORY: Past Surgical History:  Procedure Laterality Date  . CARDIAC CATHETERIZATION    . CARDIOVASCULAR STRESS TEST  12-2006  . CARDIOVERSION N/A 10/31/2015   Procedure: CARDIOVERSION;  Surgeon: Sueanne Margarita, MD;  Location: [place];  Service: Cardiovascular;  Laterality: N/A;  . CAROTID ENDARTERECTOMY Right 03-2000   CE  . CAROTID ENDARTERECTOMY Left 11-24-11   CE  . COLONOSCOPY  04-2001   polyp  . [place], 2002, 2009, 2012   8 stents total  . [place]   neg  . ENDARTERECTOMY  11/24/2011   Procedure: ENDARTERECTOMY CAROTID;  Surgeon: Rosetta Posner, MD;  Location: [place];  Service: Vascular;  Laterality: Left;  . EYE SURGERY Bilateral Aug. 17, 2015   Cataract  . FUDUCIAL PLACEMENT N/A 12/25/2014   Procedure: PLACEMENT OF FUDUCIAL;  Surgeon: Collene Gobble, MD;  Location: [place];  Service: Thoracic;  Laterality: N/A;  . PROSTATE SURGERY     Radiation Tx  X's 40  . TEE WITHOUT CARDIOVERSION N/A 10/31/2015   Procedure: TRANSESOPHAGEAL ECHOCARDIOGRAM (TEE);  Surgeon: Sueanne Margarita, MD;  Location: [facility];  Service: Cardiovascular;  Laterality: N/A;  . VASCULAR SURGERY    . VIDEO BRONCHOSCOPY WITH ENDOBRONCHIAL NAVIGATION N/A 12/25/2014   Procedure: VIDEO BRONCHOSCOPY WITH ENDOBRONCHIAL NAVIGATION  with Biopsies and Brushings;  Surgeon: Collene Gobble, MD;  Location: [place];  Service: Thoracic;  Laterality: N/A;  . VIDEO BRONCHOSCOPY WITH ENDOBRONCHIAL ULTRASOUND N/A 01/22/2015   Procedure: VIDEO BRONCHOSCOPY WITH ENDOBRONCHIAL ULTRASOUND with Biopsies;  Surgeon: Collene Gobble, MD;  Location: [place];  Service: Thoracic;  Laterality: N/A;    FAMILY HISTORY: Family History  Problem Relation Age of Onset  . Stroke Father   . Heart disease  Father   . Heart disease Mother     pacemaker  . Other Brother     benign brain tumor  . Cancer Brother     Eye-behind  . Cancer Sister  Breast  . Liver cancer Brother   . Diabetes Maternal Grandmother     SOCIAL HISTORY:  Social History   Social History  . Marital status: Widowed    Spouse name: N/A  . Number of children: 5  . Years of education: N/A   Occupational History  . Retired Airline pilot   . Part time, delivering auto [employer]   Social History Main Topics  . Smoking status: Current Every Day Smoker    Packs/day: 1.00    Years: 55.00    Types: Cigarettes  . Smokeless tobacco: Never Used     Comment: 1/2 ppd (01/07/15)  . Alcohol use 0.0 oz/week     Comment: occ  . Drug use: No  . Sexual activity: Not on file   Other Topics Concern  . Not on file   Social History Narrative  . No narrative on file     PHYSICAL EXAM  Vitals:   12/19/15 0934  BP: 126/78  Pulse: 90  Resp: 16  Weight: 145 lb (65.8 kg)  Height: '5\' 8"'$  (1.727 m)    Body mass index is 22.05 kg/m.   General: The patient is well-developed and well-nourished and in no acute distress  Neck: The neck is supple, no carotid bruits are noted.  The neck is nontender.  Skin: Extremities are without rash or edema.   He has a lot of bruising on the arms, worse on the left.  Musculoskeletal:  Back is nontender.  He is tender over the subacromial bursa of the left shoulder.  Neurologic Exam  Mental status: The patient is alert and oriented x 3 at the time of the examination. The patient has apparent normal recent and remote memory, with an apparently normal attention span and concentration ability.   Speech is normal.  Cranial nerves: Extraocular movements are full. Pupils are equal, round, and reactive to light and accomodation.  Visual fields are full.  Facial symmetry is present. There is good facial sensation to soft touch bilaterally.Facial strength is normal.  Trapezius and  sternocleidomastoid strength is normal. No dysarthria is noted.  The tongue is midline, and the patient has symmetric elevation of the soft palate. No obvious hearing deficits are noted.  Motor:  Muscle bulk is normal.   Tone is normal. Strength is  4/5 in the deltoid, wrist extensors and pronators and 4-/5 in triceps and 4+/5 in biceps.  5/5 with left grip and intrinsic hand muscles.    Also 4+/5 in the ulnar innervated right hand muscles.  in all 4 extremities.   Sensory: Sensory testing shows mild reduced touch/temp in the dorsal forearm and he is intact to pinprick, soft touch and vibration sensation elsewhere.  Coordination: Cerebellar testing reveals good finger-nose-finger and heel-to-shin bilaterally.  Gait and station: Station is normal.   Gait is slightly wide. Tandem gait is wide. Romberg is negative.   Reflexes: Deep tendon reflexes are symmetric and normal bilaterally.   Plantar responses are flexor.    DIAGNOSTIC DATA (LABS, IMAGING, TESTING) - I reviewed patient records, labs, notes, testing and imaging myself where available.  Lab Results  Component Value Date   WBC 6.1 11/20/2015   HGB 12.8 (L) 11/20/2015   HCT 36.4 (L) 11/20/2015   MCV 95.1 11/20/2015   PLT 263 11/20/2015      Component Value Date/Time   NA 134 (L) 11/21/2015 1530   K 3.8 11/21/2015 1530   CL 99 (L) 11/21/2015 1530   CO2 29 11/21/2015 1530  GLUCOSE 112 (H) 11/21/2015 1530   BUN 14 11/21/2015 1530   CREATININE 0.83 11/21/2015 1530   CALCIUM 9.2 11/21/2015 1530   PROT 6.7 11/10/2015 1730   ALBUMIN 3.8 11/10/2015 1730   AST 18 11/10/2015 1730   ALT 14 11/10/2015 1730   ALKPHOS 68 11/10/2015 1730   BILITOT 0.3 11/10/2015 1730   GFRNONAA >60 11/21/2015 1530   GFRAA >60 11/21/2015 1530   Lab Results  Component Value Date   CHOL 116 11/20/2015   HDL 51 11/20/2015   LDLCALC 52 11/20/2015   TRIG 65 11/20/2015   CHOLHDL 2.3 11/20/2015   Lab Results  Component Value Date   HGBA1C 5.8  11/20/2015   Lab Results  Component Value Date   ZOXWRUEA54 098 08/27/2015   Lab Results  Component Value Date   TSH 5.114 (H) 10/30/2015       ASSESSMENT AND PLAN  Left arm weakness  Numbness and tingling in left arm  Left shoulder pain  History of recent fall   In summary, [name] is a 75 year old man who had the onset of left arm weakness with some tingling a few days after a fall where he fractured his ribs and a couple days after switching from Plavix to Eliquis.   He has an unusual pattern of weakness affecting the deltoid (axillary nerve) triceps (radial) and wrist extensors (radial) more than other muscles.  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   He is improving I placed him on some gabapentin for the uncomfortable tingling that he is experiencing. I would not recommend any further or interaction. However, if he does not improve further or if he worsens we will check an NCV/EMG to further evaluate.   I would also consider checking an MRI of the brachial plexus if there is any worsening.   He also had shoulder pain insistent with a subacromial bursitis and I did a bursa injection on the left with 40 mg Depo-Medrol in 3 mL Marcaine using sterile technique. He tolerated the procedure well and had improved pain and range of motion of the shoulder afterwards.Marland Kitchen  He will return to see me in 6 weeks for call sooner if he has any new or worsening symptoms.  Thank you for asking me to see Mr. Watkinson for a neurologic consultation. Please let me know if I can be of further assistance with him or other patients in the future.   Atharv Barriere A. Felecia Shelling, MD, PhD 04/09/1476, 2:95 PM Certified in Neurology, Clinical Neurophysiology, Sleep Medicine, Pain Medicine and Neuroimaging  [facility] [address] [place], [place] [address] [phone]

## 2015-12-22 ENCOUNTER — Ambulatory Visit: Payer: Medicare Other | Attending: Family Medicine

## 2015-12-22 DIAGNOSIS — Z9181 History of falling: Secondary | ICD-10-CM | POA: Insufficient documentation

## 2015-12-22 DIAGNOSIS — M6281 Muscle weakness (generalized): Secondary | ICD-10-CM | POA: Diagnosis not present

## 2015-12-22 NOTE — Therapy (Signed)
[facility] [address] [place], [address] Phone: [phone]   Fax:  [phone]  Physical Therapy Treatment  Patient Details  Name: [name] MRN: [id number] Date of Birth: [birth year] Referring Provider: Abner Greenspan, MD  Encounter Date: 12/22/2015      PT End of Session - 12/22/15 1603    Visit Number 4   Number of Visits 9   Date for PT Re-Evaluation 01/01/16   Authorization Type 4   Authorization Time Period of 10   PT Start Time 1603   PT Stop Time 1646   PT Time Calculation (min) 43 min   Activity Tolerance Patient tolerated treatment well   Behavior During Therapy [facility] for tasks assessed/performed      Past Medical History:  Diagnosis Date  . AAA (abdominal aortic aneurysm) (HCC)    4.2 cm IR AAA noted on 01/13/15 PET scan  . Anxiety   . CAD (coronary artery disease)    AMI 1997 w/ BMS x 2 LAD, BMS CFX 2002, DES x 2 LAD 2009, DES CFX & OM 2012   . Carotid artery disease ([place])    RICA CEA 7062, LICA CEA 3762 per Dr. [name],   . CHF (congestive heart failure) ([place])    pt denied in 2014  . COPD (chronic obstructive pulmonary disease) ([place])   . GERD (gastroesophageal reflux disease)    occ  . HTN (hypertension)   . Hydradenitis   . Hyperlipidemia   . Ischemic cardiomyopathy    EF 40-45 percent at cath 2012  . Myocardial infarction   . Non-small cell lung cancer (NSCLC) ([place]) dx'd 12/2014   SBRT  . Osteoarthritis    pt. denies 01/17/2015  . PAC (premature atrial contraction)   . Peripheral vascular disease ([place])   . Pneumonia   . Prostate cancer ([place]) dx'd approx 2008   xrt throughfoley  . Radiation 02/18/15-02/25/15   right upper lung 54 Gy  . Shortness of breath dyspnea   . Stroke [facility]) 11-24-11   "no feeling in right 5th digit"  . Urinary frequency   . Urinary urgency     Past Surgical History:  Procedure Laterality Date  . CARDIAC CATHETERIZATION    . CARDIOVASCULAR  STRESS TEST  12-2006  . CARDIOVERSION N/A 10/31/2015   Procedure: CARDIOVERSION;  Surgeon: Sueanne Margarita, MD;  Location: [facility];  Service: Cardiovascular;  Laterality: N/A;  . CAROTID ENDARTERECTOMY Right 03-2000   CE  . CAROTID ENDARTERECTOMY Left 11-24-11   CE  . COLONOSCOPY  04-2001   polyp  . [place], 2002, 2009, 2012   8 stents total  . [place]   neg  . ENDARTERECTOMY  11/24/2011   Procedure: ENDARTERECTOMY CAROTID;  Surgeon: Rosetta Posner, MD;  Location: [facility];  Service: Vascular;  Laterality: Left;  . EYE SURGERY Bilateral Aug. 17, 2015   Cataract  . FUDUCIAL PLACEMENT N/A 12/25/2014   Procedure: PLACEMENT OF FUDUCIAL;  Surgeon: Collene Gobble, MD;  Location: [place] [facility];  Service: Thoracic;  Laterality: N/A;  . PROSTATE SURGERY     Radiation Tx  X's 40  . TEE WITHOUT CARDIOVERSION N/A 10/31/2015   Procedure: TRANSESOPHAGEAL ECHOCARDIOGRAM (TEE);  Surgeon: Sueanne Margarita, MD;  Location: [facility];  Service: Cardiovascular;  Laterality: N/A;  . VASCULAR SURGERY    . VIDEO BRONCHOSCOPY WITH ENDOBRONCHIAL NAVIGATION N/A 12/25/2014   Procedure: VIDEO BRONCHOSCOPY WITH ENDOBRONCHIAL  NAVIGATION  with Biopsies and Brushings;  Surgeon: Collene Gobble, MD;  Location: [facility];  Service: Thoracic;  Laterality: N/A;  . VIDEO BRONCHOSCOPY WITH ENDOBRONCHIAL ULTRASOUND N/A 01/22/2015   Procedure: VIDEO BRONCHOSCOPY WITH ENDOBRONCHIAL ULTRASOUND with Biopsies;  Surgeon: Collene Gobble, MD;  Location: [facility];  Service: Thoracic;  Laterality: N/A;    There were no vitals filed for this visit.      Subjective Assessment - 12/22/15 1606    Subjective Ribs don't hurt like it did. Had shots in his L shoulder last Monday which helped with his tingling. Has not fallen using the bathroom. Gets lightheaded if he sits too long and then gets up, or if he moves around too quickly such as when he bends down and picks up his dog and brings him in.  Pt also states that  his antidepressants might play a factor. Also states that he does not have an apetite. Ate a bowl of oatmeal during lunch today.  No pain or discomfort currently.  Main problem right now is weakness in his L arm. Sometimes he feels strong with his L arm, sometimes he does not. Currently being seen by a neurologist for his L arm.    Pertinent History R rib fractures x5 and hemothorax. Injury occured 10/14/2015. Pt was up using the restroom at 3 am, blacked out, and fell. Went to the hospital, was transferred to [facility] trauma center. Right now, ribs feel ok.  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  Works 12 hours a day, 2x a week or 3x a week. Had an MD appointment 12/01/2015 and had x-rays which showed rib fracture healing and hemothorax. Pt states that his MD said that he does not have pneumonia. Pt also states feeling numbness in his L hand, starting at his 5th digit which travels to his thumb and hand. Had 4 CT scans since July 2017 for his head, cervical spine, and chest area which rulled out a stroke. Just getting use back in his L hand. His MD also mentioned possible occupational therapy to get his motor skills more on point.  Pt states that he has fallen before after using the the bathroom. Pt MDs at [facility] concludes that urinating causes his falls.   HTN controlled per pt. CA is treated and decreasing.    Patient Stated Goals "Get back to work. "    Currently in Pain? No/denies   Pain Score 0-No pain                                 PT Education - 12/22/15 1634    Education provided Yes   Education Details ther-ex   Person(s) Educated Patient   Methods Explanation;Demonstration;Tactile cues;Verbal cues   Comprehension Verbalized understanding;Returned demonstration       Objectives  There-ex  Vitals monitored secondary to dizziness probably associated with blood pressure due to pt subjective.   Seated  BP (mechanically taken): 103/55, HR 88 L arm sitting.   Pt was recommended to perform ankle pumps, seated knee extensions, and marches prior to standing as well as to stand up slowly as to help decrease dizziness and promote blood flow. Pt verbalized understanding.   Directed patient with seated knee flexion resisting yellow band 10x3 each LE Seated ankle DF/PF 20x each direction Seated knee extension 10x3 resisting 2 lbs Seated hip flexion  resisting 2 lbs 10x3 each LE seated hip adductor pillow squeeze 10x3 with 5 second holds   BP L arm sitting (mechanically taken) 97/59, HR 90 BPM. Pt states having an appointment with his heart doctor sometime in November 2017.   Static standing x 2 min  Blood pressure L arm standing (mechanically taken)  78/47, HR 99  Sitting blood pressure R arm sitting 108/66, HR 90  Improved exercise technique, movement at target joints, use of target muscles after min to mod verbal, visual, tactile cues.     Seated LE strengthening exercises performed secondary to low blood pressure levels and to promote musculovenous pump and blood flow. Pt demonstrates low blood pressure levels but non-symptomatic. Levels decreased in standing compared to sitting. Pt was recommended to inform his MD. Pt verbalized understanding. Pt was recommended to continue activating his LE muscles prior to standing as well as to stand slowly to help decrease dizziness when transitioning from sit to stand. Pt verbalized understanding.          PT Long Term Goals - 12/03/15 2035      PT LONG TERM GOAL #1   Title Patient will improve bilateral LE strength by at least 1/2 MMT grade to promote ability to perform standing tasks and decrease fall risk.    Time 4   Period Weeks   Status New     PT LONG TERM GOAL #2   Title Patient will be able to ambulate at least 4 blocks to promote mobility, and ability to perform job related tasks.    Baseline Pt able to ambulate 2 blocks   Time 4    Period Weeks   Status New               Plan - 12/22/15 1635    Clinical Impression Statement Seated LE strengthening exercises performed secondary to low blood pressure levels and to promote musculovenous pump and blood flow. Pt demonstrates low blood pressure levels but non-symptomatic. Levels decreased in standing compared to sitting. Pt was recommended to inform his MD. Pt verbalized understanding. Pt was recommended to continue activating his LE muscles prior to standing as well as to stand slowly to help decrease dizziness when transitioning from sit to stand. Pt verbalized understanding.    Rehab Potential Good   Clinical Impairments Affecting Rehab Potential healing time   PT Frequency 2x / week   PT Duration 4 weeks   PT Treatment/Interventions Manual techniques;Therapeutic exercise;Balance training;Neuromuscular re-education;Patient/family education;Therapeutic activities;Functional mobility training;Gait training   PT Next Visit Plan LE strengthening   Consulted and Agree with Plan of Care Patient      Patient will benefit from skilled therapeutic intervention in order to improve the following deficits and impairments:  Pain, Decreased strength, Difficulty walking  Visit Diagnosis: Muscle weakness (generalized)  History of falling     Problem List Patient Active Problem List   Diagnosis Date Noted  . Left arm weakness 12/19/2015  . Numbness and tingling in left arm 12/19/2015  . Left shoulder pain 12/19/2015  . History of recent fall 12/19/2015  . Paresthesia of arm 12/01/2015  . Depression 12/01/2015  . Malnutrition of moderate degree 11/20/2015  . TIA (transient ischemic attack) 11/19/2015  . Weakness 11/17/2015  . Physical deconditioning 11/10/2015  . Multiple rib fractures 11/10/2015  . Chronic systolic heart failure ([place]) 11/02/2015  . Atrial flutter with rapid ventricular response ([place]) 10/30/2015  . Atrial flutter ([place]) 10/30/2015  . B12 deficiency  07/22/2015  . Anemia  07/15/2015  . Hematuria 07/08/2015  . Unintentional weight loss 07/08/2015  . Orthostatic hypotension 07/08/2015  . COPD with emphysema ([place]) 05/22/2015  . Cough 05/22/2015  . Tobacco user 05/22/2015  . Mediastinal lymphadenopathy 01/22/2015  . 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 S/P bronchoscopy with biopsy   . Colon cancer screening 11/06/2014  . Encounter for Medicare annual wellness exam 11/06/2014  . Routine general medical examination at a health care facility 11/06/2014  . Lung nodule 06/28/2014  . Carotid stenosis 12/20/2013  . Aftercare following surgery of the circulatory system 12/20/2013  . Aftercare following surgery of the circulatory system, [place] 12/19/2012  . Personal history of colonic polyps 08/28/2012  . Hyperglycemia 08/07/2012  . Pre-operative cardiovascular examination 11/03/2011  . Carotid artery disease ([place]) 05/20/2011  . Grief reaction 05/07/2011  . Ischemic cardiomyopathy 01/13/2011  . Palpitations 11/10/2010  . Occlusion and stenosis of carotid artery without mention of cerebral infarction 04/02/2010  . Anxiety disorder 12/12/2009  . HYPERTENSION, BENIGN 09/29/2008  . History of prostate cancer 09/19/2008  . Hyperlipidemia 02/10/2007  . AMAUROSIS FUGAX 02/10/2007  . MYOCARDIAL INFARCTION, HX OF 02/10/2007  . Coronary atherosclerosis 02/10/2007  . PERIPHERAL VASCULAR DISEASE 02/10/2007  . HEMORRHOIDS 02/10/2007  . Chronic obstructive pulmonary disease ([place]) 02/10/2007  . OSTEOARTHRITIS 02/10/2007  . Nicotine dependence 02/10/2007  . Pure hypercholesterolemia 01/27/2007  . CORONARY ATHEROSCLEROSIS NATIVE CORONARY ARTERY 01/27/2007    Joneen Boers PT, DPT   12/22/2015, 5:06 PM  [facility] [address], [place], [address] Phone: [phone]   Fax:  [phone]  Name: FAUST THORINGTON MRN: [id number] Date of Birth: [birth year]

## 2015-12-25 ENCOUNTER — Ambulatory Visit: Payer: Medicare Other

## 2015-12-25 ENCOUNTER — Telehealth: Payer: Self-pay

## 2015-12-25 DIAGNOSIS — M6281 Muscle weakness (generalized): Secondary | ICD-10-CM | POA: Diagnosis not present

## 2015-12-25 DIAGNOSIS — Z9181 History of falling: Secondary | ICD-10-CM | POA: Diagnosis not present

## 2015-12-25 NOTE — Patient Instructions (Signed)
Plantar Flexion: Resisted     You can perform this exercise in sitting.   Wrap blue theraband around the ball of your foot. Tightness to medium level effort. Press down.    Repeat _10___ times per set. Do __3__ sets per session. Do __2__ sessions per day.  http://orth.exer.us/10   Copyright  VHI. All rights reserved.

## 2015-12-25 NOTE — Telephone Encounter (Signed)
I will cc to his cardiologist as well - has had trouble with blood pressure before

## 2015-12-25 NOTE — Telephone Encounter (Signed)
Miguel PT at [facility] PT said that he is working with pt to decrease fall risk; pt gets dizzy after walks 30 feet. After sitting takes about 1 min for dizziness to subside. BP was 93/63 sitting. Miguel said pt has problem following directions also. Miguel suggested to pt to schedule appt with Dr Glori Bickers. I was not able to reach pt at contact #s. I spoke with Magda Paganini pts daughter (DPR signed) and she scheduled 30 min appt on 12/26/15 at 3:45 with Dr Glori Bickers. Also see [place] [facility] PT note 12/25/15. FYI to Dr Glori Bickers.

## 2015-12-25 NOTE — Therapy (Signed)
[facility] [address] [place], [address] Phone: [phone]   Fax:  [phone]  Physical Therapy Treatment  Patient Details  Name: [name] MRN: [id number] Date of Birth: May 10, 1940 Referring Provider: Abner Greenspan, MD  Encounter Date: 12/25/2015      PT End of Session - 12/25/15 0804    Visit Number 5   Number of Visits 9   Date for PT Re-Evaluation 01/01/16   Authorization Type 5   Authorization Time Period of 10   PT Start Time 0804   PT Stop Time 0845   PT Time Calculation (min) 41 min   Activity Tolerance Patient tolerated treatment well   Behavior During Therapy [facility] for tasks assessed/performed      Past Medical History:  Diagnosis Date  . AAA (abdominal aortic aneurysm) (HCC)    4.2 cm IR AAA noted on 01/13/15 PET scan  . Anxiety   . CAD (coronary artery disease)    AMI 1997 w/ BMS x 2 LAD, BMS CFX 2002, DES x 2 LAD 2009, DES CFX & OM 2012   . Carotid artery disease ([place])    RICA CEA 6606, LICA CEA 3016 per Dr. [name],   . CHF (congestive heart failure) ([place])    pt denied in 2014  . COPD (chronic obstructive pulmonary disease) ([place])   . GERD (gastroesophageal reflux disease)    occ  . HTN (hypertension)   . Hydradenitis   . Hyperlipidemia   . Ischemic cardiomyopathy    EF 40-45 percent at cath 2012  . Myocardial infarction   . Non-small cell lung cancer (NSCLC) ([place]) dx'd 12/2014   SBRT  . Osteoarthritis    pt. denies 01/17/2015  . PAC (premature atrial contraction)   . Peripheral vascular disease ([place])   . Pneumonia   . Prostate cancer ([place]) dx'd approx 2008   xrt throughfoley  . Radiation 02/18/15-02/25/15   right upper lung 54 Gy  . Shortness of breath dyspnea   . Stroke [facility]) 11-24-11   "no feeling in right 5th digit"  . Urinary frequency   . Urinary urgency     Past Surgical History:  Procedure Laterality Date  . CARDIAC CATHETERIZATION    . CARDIOVASCULAR  STRESS TEST  12-2006  . CARDIOVERSION N/A 10/31/2015   Procedure: CARDIOVERSION;  Surgeon: Sueanne Margarita, MD;  Location: [place];  Service: Cardiovascular;  Laterality: N/A;  . CAROTID ENDARTERECTOMY Right 03-2000   CE  . CAROTID ENDARTERECTOMY Left 11-24-11   CE  . COLONOSCOPY  04-2001   polyp  . [place], 2002, 2009, 2012   8 stents total  . [place]   neg  . ENDARTERECTOMY  11/24/2011   Procedure: ENDARTERECTOMY CAROTID;  Surgeon: Rosetta Posner, MD;  Location: [place];  Service: Vascular;  Laterality: Left;  . EYE SURGERY Bilateral Aug. 17, 2015   Cataract  . FUDUCIAL PLACEMENT N/A 12/25/2014   Procedure: PLACEMENT OF FUDUCIAL;  Surgeon: Collene Gobble, MD;  Location: [place];  Service: Thoracic;  Laterality: N/A;  . PROSTATE SURGERY     Radiation Tx  X's 40  . TEE WITHOUT CARDIOVERSION N/A 10/31/2015   Procedure: TRANSESOPHAGEAL ECHOCARDIOGRAM (TEE);  Surgeon: Sueanne Margarita, MD;  Location: [facility] ENDOSCOPY;  Service: Cardiovascular;  Laterality: N/A;  . VASCULAR SURGERY    . VIDEO BRONCHOSCOPY WITH ENDOBRONCHIAL NAVIGATION N/A 12/25/2014   Procedure: VIDEO BRONCHOSCOPY WITH ENDOBRONCHIAL  NAVIGATION  with Biopsies and Brushings;  Surgeon: Collene Gobble, MD;  Location: [place];  Service: Thoracic;  Laterality: N/A;  . VIDEO BRONCHOSCOPY WITH ENDOBRONCHIAL ULTRASOUND N/A 01/22/2015   Procedure: VIDEO BRONCHOSCOPY WITH ENDOBRONCHIAL ULTRASOUND with Biopsies;  Surgeon: Collene Gobble, MD;  Location: [place];  Service: Thoracic;  Laterality: N/A;    There were no vitals filed for this visit.      Subjective Assessment - 12/25/15 0805    Subjective Pt states that he is lightheaded every morning when he gets up. Not right now. Did not feel light headed when standing up from the waiting room chair to go to the PT gym.  No pain or discomfort currently.  Did not eat breakfast this morning.    Pertinent History R rib fractures x5 and hemothorax. Injury occured  10/14/2015. Pt was up using the restroom at 3 am, blacked out, and fell. Went to the hospital, was transferred to [facility] trauma center. Right now, ribs feel ok.  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  Works 12 hours a day, 2x a week or 3x a week. Had an MD appointment 12/01/2015 and had x-rays which showed rib fracture healing and hemothorax. Pt states that his MD said that he does not have pneumonia. Pt also states feeling numbness in his L hand, starting at his 5th digit which travels to his thumb and hand. Had 4 CT scans since July 2017 for his head, cervical spine, and chest area which rulled out a stroke. Just getting use back in his L hand. His MD also mentioned possible occupational therapy to get his motor skills more on point.  Pt states that he has fallen before after using the the bathroom. Pt MDs at [facility] concludes that urinating causes his falls.   HTN controlled per pt. CA is treated and decreasing.    Patient Stated Goals "Get back to work. "    Currently in Pain? No/denies   Pain Score 0-No pain            [facility] PT Assessment - 12/25/15 1884      Assessment   Next MD Visit Pt states that he does not have a follow up appointment yet.                              PT Education - 12/25/15 1660    Education provided Yes   Education Details ther-ex, HEP, MD follow up recommended   Person(s) Educated Patient   Methods Explanation;Demonstration;Tactile cues;Verbal cues   Comprehension Returned demonstration;Verbalized understanding        Objectives  There-ex   Directed patient with seated knee flexion resisting red band 10x2 each LE  Seated knee extension 10x3 resisting 2 lbs seated hip adductor pillow squeeze 10x3 with 5 second holds   Forward step up onto 1st regular step with bilateral UE assist 5x with R LE. 3x with L LE, rest break secondary to fatigue.   Seated manually resisted  leg press 10x3 each LE  Seated ankle PF resisting blue band 10x3 each LE  Light headedness reproduced with walking about 30 ft. Pt was asked to sit down and rest.   Blood pressure obtained. 99/63 HR 90 BPM sitting, mechanically taken. Pt states that his lightheadedness dissipated after a few minutes of sitting.  Pt was recommended to get a follow up  with his referring MD pertaining to dizziness/lightheadedness when standing. Pt verbalized understanding.   NuStep seat 8, arms 8, Level 1 x 4 minutes. Skilled treatment secondary to exercise used to promote gentle muscle use, and blood flow. Cues for proper pace.  Monitored patient. No lightheadedness after session when walking with the patient to the car from the gym.    Improved exercise technique, movement at target joints, use of target muscles after mod verbal, visual, tactile cues.     Light exercises performed today secondary to pt fatigue. Better able to perform sitting exercises compared to standing exercises. Lightheadedness reproduced after walking about 30 ft. Pt was recommended to see his doctor pertaining to that as well as with his low blood pressure levels. Pt verbalized understanding. Pt also demonstrates some difficulty following directions when fatigued or lightheaded. Talked to Dr. Marliss Coots nurse and informed her via phone conversation [phone]) and to check the patient out. Dr. Marliss Coots nurse said that she will let the doctor know and that she will try to give the patient a call and see what they can do. No complain of light headedness after session when walking with the patient to the car from the gym.   .          PT Long Term Goals - 12/03/15 2035      PT LONG TERM GOAL #1   Title Patient will improve bilateral LE strength by at least 1/2 MMT grade to promote ability to perform standing tasks and decrease fall risk.    Time 4   Period Weeks   Status New     PT LONG TERM GOAL #2   Title Patient will be able to  ambulate at least 4 blocks to promote mobility, and ability to perform job related tasks.    Baseline Pt able to ambulate 2 blocks   Time 4   Period Weeks   Status New               Plan - 12/25/15 1017    Clinical Impression Statement Light exercises performed today secondary to pt fatigue. Better able to perform sitting exercises compared to standing exercises. Lightheadedness reproduced after walking about 30 ft. Pt was recommended to see his doctor pertaining to that as well as with his low blood pressure levels. Pt verbalized understanding. Pt also demonstrates some difficulty following directions when fatigued or lightheaded. Talked to Dr. Marliss Coots nurse and informed her via phone conversation [phone]) and to check the patient out. Dr. Marliss Coots nurse said that she will let the doctor know and that she will try to give the patient a call and see what they can do. No complain of light headedness after session when walking with the patient to the car from the gym.    Rehab Potential Good   Clinical Impairments Affecting Rehab Potential healing time   PT Frequency 2x / week   PT Duration 4 weeks   PT Treatment/Interventions Manual techniques;Therapeutic exercise;Balance training;Neuromuscular re-education;Patient/family education;Therapeutic activities;Functional mobility training;Gait training   PT Next Visit Plan LE strengthening   Consulted and Agree with Plan of Care Patient      Patient will benefit from skilled therapeutic intervention in order to improve the following deficits and impairments:  Pain, Decreased strength, Difficulty walking  Visit Diagnosis: Muscle weakness (generalized)  History of falling     Problem List Patient Active Problem List   Diagnosis Date Noted  . Left arm weakness 12/19/2015  . Numbness and tingling in left  arm 12/19/2015  . Left shoulder pain 12/19/2015  . History of recent fall 12/19/2015  . Paresthesia of arm 12/01/2015  .  Depression 12/01/2015  . Malnutrition of moderate degree 11/20/2015  . TIA (transient ischemic attack) 11/19/2015  . Weakness 11/17/2015  . Physical deconditioning 11/10/2015  . Multiple rib fractures 11/10/2015  . Chronic systolic heart failure ([place]) 11/02/2015  . Atrial flutter with rapid ventricular response ([place]) 10/30/2015  . Atrial flutter ([place]) 10/30/2015  . B12 deficiency 07/22/2015  . Anemia 07/15/2015  . Hematuria 07/08/2015  . Unintentional weight loss 07/08/2015  . Orthostatic hypotension 07/08/2015  . COPD with emphysema ([place]) 05/22/2015  . Cough 05/22/2015  . Tobacco user 05/22/2015  . Mediastinal lymphadenopathy 01/22/2015  . 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 S/P bronchoscopy with biopsy   . Colon cancer screening 11/06/2014  . Encounter for Medicare annual wellness exam 11/06/2014  . Routine general medical examination at a health care facility 11/06/2014  . Lung nodule 06/28/2014  . Carotid stenosis 12/20/2013  . Aftercare following surgery of the circulatory system 12/20/2013  . Aftercare following surgery of the circulatory system, [place] 12/19/2012  . Personal history of colonic polyps 08/28/2012  . Hyperglycemia 08/07/2012  . Pre-operative cardiovascular examination 11/03/2011  . Carotid artery disease ([place]) 05/20/2011  . Grief reaction 05/07/2011  . Ischemic cardiomyopathy 01/13/2011  . Palpitations 11/10/2010  . Occlusion and stenosis of carotid artery without mention of cerebral infarction 04/02/2010  . Anxiety disorder 12/12/2009  . HYPERTENSION, BENIGN 09/29/2008  . History of prostate cancer 09/19/2008  . Hyperlipidemia 02/10/2007  . AMAUROSIS FUGAX 02/10/2007  . MYOCARDIAL INFARCTION, HX OF 02/10/2007  . Coronary atherosclerosis 02/10/2007  . PERIPHERAL VASCULAR DISEASE 02/10/2007  . HEMORRHOIDS 02/10/2007  . Chronic obstructive pulmonary disease ([place]) 02/10/2007  . OSTEOARTHRITIS 02/10/2007  . Nicotine dependence 02/10/2007  .  Pure hypercholesterolemia 01/27/2007  . CORONARY ATHEROSCLEROSIS NATIVE CORONARY ARTERY 01/27/2007    Joneen Boers PT, DPT   12/25/2015, 11:58 AM  [facility] [address] [place], [address] Phone: [phone]   Fax:  [phone]  Name: DAKODA LAVENTURE MRN: [id number] Date of Birth: [birth year]

## 2015-12-26 ENCOUNTER — Encounter: Payer: Self-pay | Admitting: Family Medicine

## 2015-12-26 ENCOUNTER — Ambulatory Visit (INDEPENDENT_AMBULATORY_CARE_PROVIDER_SITE_OTHER): Payer: Medicare Other | Admitting: Family Medicine

## 2015-12-26 VITALS — BP 118/78 | HR 94 | Temp 98.3°F | Resp 20 | Wt 142.8 lb

## 2015-12-26 DIAGNOSIS — R5381 Other malaise: Secondary | ICD-10-CM

## 2015-12-26 DIAGNOSIS — Z23 Encounter for immunization: Secondary | ICD-10-CM | POA: Diagnosis not present

## 2015-12-26 DIAGNOSIS — Z720 Tobacco use: Secondary | ICD-10-CM

## 2015-12-26 DIAGNOSIS — I951 Orthostatic hypotension: Secondary | ICD-10-CM

## 2015-12-26 NOTE — Telephone Encounter (Signed)
Thanks- that is what I figured- I will see him today and keep you posted

## 2015-12-26 NOTE — Progress Notes (Signed)
Subjective:    Patient ID: [name], male    DOB: Apr 03, 1940, 75 y.o.   MRN: [id number]  HPI Here for dizziness and pre syncope   Was called yesterday from [facility] PT  - stated he gets dizzy after walking 30 feet  bp was 93/63  Had problems following directions at the time also   At this time pt notices that he gets dizzy when he stand up from sitting   Takes 1/2 tab of xanax only as needed   Just started gabapentin from Dr Felecia Shelling- and he did not like it so he stopped it    Wt Readings from Last 3 Encounters:  12/26/15 142 lb 12 oz (64.8 kg)  12/19/15 145 lb (65.8 kg)  12/03/15 149 lb 1.9 oz (67.6 kg)   bmi is 21.7  BP Readings from Last 3 Encounters:  12/26/15 118/78  12/19/15 126/78  12/03/15 110/60   On mirtazapine - makes him sleepy  Mood is ok  Sleepy -sleeping well at night  No increase in appetite yet   Still smoking-no plans to quit - but may consider patch in the future when ready  Patient Active Problem List   Diagnosis Date Noted  . Left arm weakness 12/19/2015  . Numbness and tingling in left arm 12/19/2015  . Left shoulder pain 12/19/2015  . History of recent fall 12/19/2015  . Paresthesia of arm 12/01/2015  . Depression 12/01/2015  . Malnutrition of moderate degree 11/20/2015  . TIA (transient ischemic attack) 11/19/2015  . Weakness 11/17/2015  . Physical deconditioning 11/10/2015  . Multiple rib fractures 11/10/2015  . Chronic systolic heart failure ([place]) 11/02/2015  . Atrial flutter with rapid ventricular response ([place]) 10/30/2015  . Atrial flutter ([place]) 10/30/2015  . B12 deficiency 07/22/2015  . Anemia 07/15/2015  . Hematuria 07/08/2015  . Unintentional weight loss 07/08/2015  . Hypotension 07/08/2015  . COPD with emphysema ([place]) 05/22/2015  . Cough 05/22/2015  . Tobacco user 05/22/2015  . Mediastinal lymphadenopathy 01/22/2015  . 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 S/P bronchoscopy with biopsy   . Colon cancer  screening 11/06/2014  . Encounter for Medicare annual wellness exam 11/06/2014  . Routine general medical examination at a health care facility 11/06/2014  . Lung nodule 06/28/2014  . Carotid stenosis 12/20/2013  . Aftercare following surgery of the circulatory system 12/20/2013  . Aftercare following surgery of the circulatory system, [place] 12/19/2012  . Personal history of colonic polyps 08/28/2012  . Hyperglycemia 08/07/2012  . Pre-operative cardiovascular examination 11/03/2011  . Carotid artery disease ([place]) 05/20/2011  . Grief reaction 05/07/2011  . Ischemic cardiomyopathy 01/13/2011  . Palpitations 11/10/2010  . Occlusion and stenosis of carotid artery without mention of cerebral infarction 04/02/2010  . Anxiety disorder 12/12/2009  . HYPERTENSION, BENIGN 09/29/2008  . History of prostate cancer 09/19/2008  . Hyperlipidemia 02/10/2007  . AMAUROSIS FUGAX 02/10/2007  . MYOCARDIAL INFARCTION, HX OF 02/10/2007  . Coronary atherosclerosis 02/10/2007  . PERIPHERAL VASCULAR DISEASE 02/10/2007  . HEMORRHOIDS 02/10/2007  . Chronic obstructive pulmonary disease ([place]) 02/10/2007  . OSTEOARTHRITIS 02/10/2007  . Nicotine dependence 02/10/2007  . Pure hypercholesterolemia 01/27/2007  . CORONARY ATHEROSCLEROSIS NATIVE CORONARY ARTERY 01/27/2007   Past Medical History:  Diagnosis Date  . AAA (abdominal aortic aneurysm) (HCC)    4.2 cm IR AAA noted on 01/13/15 PET scan  . Anxiety   . CAD (coronary artery disease)    AMI 1997 w/ BMS x 2 LAD, BMS CFX  2002, DES x 2 LAD 2009, DES CFX & OM 2012   . Carotid artery disease ([place])    RICA CEA 1601, LICA CEA 0932 per Dr. [name],   . CHF (congestive heart failure) ([place])    pt denied in 2014  . COPD (chronic obstructive pulmonary disease) ([place])   . GERD (gastroesophageal reflux disease)    occ  . HTN (hypertension)   . Hydradenitis   . Hyperlipidemia   . Ischemic cardiomyopathy    EF 40-45 percent at cath 2012  . Myocardial infarction   .  Non-small cell lung cancer (NSCLC) ([place]) dx'd 12/2014   SBRT  . Osteoarthritis    pt. denies 01/17/2015  . PAC (premature atrial contraction)   . Peripheral vascular disease ([place])   . Pneumonia   . Prostate cancer ([place]) dx'd approx 2008   xrt throughfoley  . Radiation 02/18/15-02/25/15   right upper lung 54 Gy  . Shortness of breath dyspnea   . Stroke [facility]) 11-24-11   "no feeling in right 5th digit"  . Urinary frequency   . Urinary urgency    Past Surgical History:  Procedure Laterality Date  . CARDIAC CATHETERIZATION    . CARDIOVASCULAR STRESS TEST  12-2006  . CARDIOVERSION N/A 10/31/2015   Procedure: CARDIOVERSION;  Surgeon: Sueanne Margarita, MD;  Location: [place];  Service: Cardiovascular;  Laterality: N/A;  . CAROTID ENDARTERECTOMY Right 03-2000   CE  . CAROTID ENDARTERECTOMY Left 11-24-11   CE  . COLONOSCOPY  04-2001   polyp  . [place], 2002, 2009, 2012   8 stents total  . [place]   neg  . ENDARTERECTOMY  11/24/2011   Procedure: ENDARTERECTOMY CAROTID;  Surgeon: Rosetta Posner, MD;  Location: [place];  Service: Vascular;  Laterality: Left;  . EYE SURGERY Bilateral Aug. 17, 2015   Cataract  . FUDUCIAL PLACEMENT N/A 12/25/2014   Procedure: PLACEMENT OF FUDUCIAL;  Surgeon: Collene Gobble, MD;  Location: [place];  Service: Thoracic;  Laterality: N/A;  . PROSTATE SURGERY     Radiation Tx  X's 40  . TEE WITHOUT CARDIOVERSION N/A 10/31/2015   Procedure: TRANSESOPHAGEAL ECHOCARDIOGRAM (TEE);  Surgeon: Sueanne Margarita, MD;  Location: [facility] ENDOSCOPY;  Service: Cardiovascular;  Laterality: N/A;  . VASCULAR SURGERY    . VIDEO BRONCHOSCOPY WITH ENDOBRONCHIAL NAVIGATION N/A 12/25/2014   Procedure: VIDEO BRONCHOSCOPY WITH ENDOBRONCHIAL NAVIGATION  with Biopsies and Brushings;  Surgeon: Collene Gobble, MD;  Location: [place];  Service: Thoracic;  Laterality: N/A;  . VIDEO BRONCHOSCOPY WITH ENDOBRONCHIAL ULTRASOUND N/A 01/22/2015   Procedure: VIDEO  BRONCHOSCOPY WITH ENDOBRONCHIAL ULTRASOUND with Biopsies;  Surgeon: Collene Gobble, MD;  Location: [place] [facility];  Service: Thoracic;  Laterality: N/A;   Social History  Substance Use Topics  . Smoking status: Current Every Day Smoker    Packs/day: 1.00    Years: 55.00    Types: Cigarettes  . Smokeless tobacco: Never Used     Comment: 1/2 ppd (01/07/15)  . Alcohol use 0.0 oz/week     Comment: occ   Family History  Problem Relation Age of Onset  . Stroke Father   . Heart disease Father   . Heart disease Mother     pacemaker  . Other Brother     benign brain tumor  . Cancer Brother     Eye-behind  . Cancer Sister     Breast  . Liver cancer Brother   . Diabetes Maternal  Grandmother    Allergies  Allergen Reactions  . Augmentin [Amoxicillin-Pot Clavulanate] Diarrhea, Nausea And Vomiting and Other (See Comments)    Has patient had a PCN reaction causing immediate rash, facial/tongue/throat swelling, SOB or lightheadedness with hypotension: No Has patient had a PCN reaction causing severe rash involving mucus membranes or skin necrosis: No Has patient had a PCN reaction that required hospitalization No Has patient had a PCN reaction occurring within the last 10 years: Yes If all of the above answers are "NO", then may proceed with Cephalosporin use.   . Chantix [Varenicline]     Violent nightmares  . Sulfonamide Derivatives Other (See Comments)    Pt do not remember 8-30 md  . Zantac [Ranitidine Hcl] Diarrhea   Current Outpatient Prescriptions on File Prior to Visit  Medication Sig Dispense Refill  . acetaminophen (TYLENOL) 500 MG tablet Take 1,000 mg by mouth every 4 (four) hours as needed for mild pain, moderate pain, fever or headache.     . albuterol (PROVENTIL HFA;VENTOLIN HFA) 108 (90 Base) MCG/ACT inhaler Inhale 2 puffs into the lungs every 4 (four) hours as needed for wheezing or shortness of breath.    . ALPRAZolam (XANAX) 0.5 MG tablet Take 0.5 mg by mouth at bedtime as  needed for anxiety or sleep.    Marland Kitchen aspirin EC 81 MG tablet Take 81 mg by mouth at bedtime.     . clopidogrel (PLAVIX) 75 MG tablet Take 1 tablet (75 mg total) by mouth daily. 30 tablet 11  . ezetimibe (ZETIA) 10 MG tablet Take 1 tablet (10 mg total) by mouth daily. 30 tablet 0  . Fluticasone-Salmeterol (ADVAIR DISKUS) 500-50 MCG/DOSE AEPB Inhale 1 puff into the lungs 2 (two) times daily. 3 each 0  . ipratropium-albuterol (DUONEB) 0.5-2.5 (3) MG/3ML SOLN Take 3 mLs by nebulization every 4 (four) hours as needed (shortness of breath and wheezing).    Marland Kitchen loratadine (CLARITIN) 10 MG tablet Take 10 mg by mouth daily.    . mirtazapine (REMERON) 15 MG tablet Take 1 tablet (15 mg total) by mouth at bedtime. 30 tablet 5  . Multiple Vitamin (MULTIVITAMIN WITH MINERALS) TABS tablet Take 1 tablet by mouth daily.    . nitroGLYCERIN (NITROSTAT) 0.4 MG SL tablet Place 0.4 mg under the tongue every 5 (five) minutes as needed for chest pain.    . Tamsulosin HCl (FLOMAX) 0.4 MG CAPS Take 0.4 mg by mouth at bedtime.     Marland Kitchen tiotropium (SPIRIVA) 18 MCG inhalation capsule Place 18 mcg into inhaler and inhale daily.     No current facility-administered medications on file prior to visit.     Review of Systems Review of Systems  Constitutional: Negative for fever, appetite change, fatigue and unexpected weight change. (pos for baseline poor appetite), pos for generalized weakness , pos for sleepiness from his medication Eyes: Negative for pain and visual disturbance.  Respiratory: Negative for cough and pos for baseline shortness of breath.   Cardiovascular: Negative for cp or palpitations    Gastrointestinal: Negative for nausea, diarrhea and constipation.  Genitourinary: Negative for urgency and frequency.  Skin: Negative for pallor or rash   Neurological: Negative for weakness, light-headedness, numbness and headaches.  Hematological: Negative for adenopathy. Does not bruise/bleed easily.  Psychiatric/Behavioral:  Negative for dysphoric mood. The patient occ nervous/anxious.         Objective:   Physical Exam  Constitutional: He appears well-developed and well-nourished. No distress.  Slim and frail appearing elderly male (chronically  ill appearing)  HENT:  Head: Normocephalic and atraumatic.  Mouth/Throat: Oropharynx is clear and moist.  Eyes: Conjunctivae and EOM are normal. Pupils are equal, round, and reactive to light. Right eye exhibits no discharge. Left eye exhibits no discharge. No scleral icterus.  Neck: Normal range of motion. Neck supple. No JVD present. No thyromegaly present.  Cardiovascular: Normal rate, regular rhythm, normal heart sounds and intact distal pulses.  Exam reveals no gallop.   Positive orthostatics for blood pressure  Pulse goes up slightly  Pulmonary/Chest: Effort normal and breath sounds normal. No respiratory distress. He has no wheezes. He has no rales.  No crackles  Diffusely distant bs  Harsh and distant bs No wheeze  Abdominal: Soft. Bowel sounds are normal. He exhibits no distension, no abdominal bruit and no mass. There is no tenderness.  Musculoskeletal: He exhibits no edema.  Lymphadenopathy:    He has no cervical adenopathy.  Neurological: He is alert. He has normal reflexes. No cranial nerve deficit. He exhibits normal muscle tone. Coordination normal.  Skin: Skin is warm and dry. No rash noted.  Psychiatric: He has a normal mood and affect.          Assessment & Plan:   Problem List Items Addressed This Visit      Cardiovascular and Mediastinum   Hypotension    Orthostatic hypotension in pt with CAD/vasc dz , copd and lung cancer as well as atrial arrhythmia   Pos orthostatic bp changes today lying to sitting to standing  He is not tolerating pos changes in PT (notes reviewed)-with near syncope at last visit  High fall risk  He does not seem to be tolerating metoprolol  Will hold it -carefully watching his HR and bp   (as well as s/s of  arrhythmia)  He will check bp and pulse at home and check in Monday regarding how he is feeling  Again enc smoking cessation  Continue cardiology f/u          Other   Physical deconditioning    Holding metoprolol for orthostatic hypotension - will see if he does better in PT now He is motivated to get stronger  Need to eat more regularly/even if not hungry-disc plan for regular small meals      Tobacco user - Primary    Disc in detail risks of smoking and possible outcomes including copd, vascular/ heart disease, cancer , respiratory and sinus infections  Pt voices understanding Pt states he is not ready to quit yet -may consider re trial of nicotine repl in the future if that is a safe option        Other Visit Diagnoses    Need for influenza vaccination       Relevant Orders   Flu Vaccine QUAD 36+ mos PF IM (Fluarix & Fluzone Quad PF) (Completed)

## 2015-12-26 NOTE — Telephone Encounter (Signed)
His only medication that should affect his BP is the metoprolol. Given his atrial arrhythmias, I would prefer to leave him on that if possible but if he is hypotensive may have to stop it. chris

## 2015-12-26 NOTE — Patient Instructions (Addendum)
Hold your metoprolol for now -I think it is causing your blood pressure to go too low when you change position (that causes dizziness)  Keep an eye on your blood pressure at home and also your pulse  Call us next week- let me know how blood pressure is running and your average pulse range  If you feel like you go into afib - alert [place] or cardiology immediately  Follow up with cardiology as planned   Try to keep up with good fluid intake Also regular small meals - even if you are not really hungry

## 2015-12-26 NOTE — Telephone Encounter (Signed)
Thanks

## 2015-12-27 ENCOUNTER — Other Ambulatory Visit: Payer: Self-pay | Admitting: Cardiology

## 2015-12-28 NOTE — Assessment & Plan Note (Signed)
Holding metoprolol for orthostatic hypotension - will see if he does better in PT now He is motivated to get stronger  Need to eat more regularly/even if not hungry-disc plan for regular small meals

## 2015-12-28 NOTE — Assessment & Plan Note (Signed)
Orthostatic hypotension in pt with CAD/vasc dz , copd and lung cancer as well as atrial arrhythmia   Pos orthostatic bp changes today lying to sitting to standing  He is not tolerating pos changes in PT (notes reviewed)-with near syncope at last visit  High fall risk  He does not seem to be tolerating metoprolol  Will hold it -carefully watching his HR and bp   (as well as s/s of arrhythmia)  He will check bp and pulse at home and check in Monday regarding how he is feeling  Again enc smoking cessation  Continue cardiology f/u

## 2015-12-28 NOTE — Assessment & Plan Note (Signed)
Disc in detail risks of smoking and possible outcomes including copd, vascular/ heart disease, cancer , respiratory and sinus infections  Pt voices understanding Pt states he is not ready to quit yet -may consider re trial of nicotine repl in the future if that is a safe option

## 2015-12-29 ENCOUNTER — Ambulatory Visit: Payer: Medicare Other

## 2015-12-29 DIAGNOSIS — M6281 Muscle weakness (generalized): Secondary | ICD-10-CM

## 2015-12-29 DIAGNOSIS — Z9181 History of falling: Secondary | ICD-10-CM | POA: Diagnosis not present

## 2015-12-29 NOTE — Patient Instructions (Signed)
   Pt was instructed that when performing his heel toe raises, to do them 5 at a time with a chair near by for rest breaks and to hold onto something sturdy such as a kitchen counter for safety. Pt verbalized understanding.

## 2015-12-29 NOTE — Therapy (Signed)
[facility] [facility] [address] [place], [address] Phone: [phone]   Fax:  [phone]  Physical Therapy Treatment  Patient Details  Name: [name] MRN: [id number] Date of Birth: 11-Mar-1941 Referring Provider: Abner Greenspan, MD  Encounter Date: 12/29/2015      PT End of Session - 12/29/15 0843    Visit Number 6   Number of Visits 9   Date for PT Re-Evaluation 01/01/16   Authorization Type 6   Authorization Time Period of 10   PT Start Time 0843   PT Stop Time 0929   PT Time Calculation (min) 46 min   Activity Tolerance Patient tolerated treatment well   Behavior During Therapy [facility] for tasks assessed/performed      Past Medical History:  Diagnosis Date  . AAA (abdominal aortic aneurysm) (HCC)    4.2 cm IR AAA noted on 01/13/15 PET scan  . Anxiety   . CAD (coronary artery disease)    AMI 1997 w/ BMS x 2 LAD, BMS CFX 2002, DES x 2 LAD 2009, DES CFX & OM 2012   . Carotid artery disease ([place])    RICA CEA 5400, LICA CEA 8676 per Dr. [name],   . CHF (congestive heart failure) ([place])    pt denied in 2014  . COPD (chronic obstructive pulmonary disease) ([place])   . GERD (gastroesophageal reflux disease)    occ  . HTN (hypertension)   . Hydradenitis   . Hyperlipidemia   . Ischemic cardiomyopathy    EF 40-45 percent at cath 2012  . Myocardial infarction   . Non-small cell lung cancer (NSCLC) ([place]) dx'd 12/2014   SBRT  . Osteoarthritis    pt. denies 01/17/2015  . PAC (premature atrial contraction)   . Peripheral vascular disease ([place])   . Pneumonia   . Prostate cancer ([place]) dx'd approx 2008   xrt throughfoley  . Radiation 02/18/15-02/25/15   right upper lung 54 Gy  . Shortness of breath dyspnea   . Stroke [facility]) 11-24-11   "no feeling in right 5th digit"  . Urinary frequency   . Urinary urgency     Past Surgical History:  Procedure Laterality Date  . CARDIAC CATHETERIZATION    . CARDIOVASCULAR  STRESS TEST  12-2006  . CARDIOVERSION N/A 10/31/2015   Procedure: CARDIOVERSION;  Surgeon: Sueanne Margarita, MD;  Location: [place];  Service: Cardiovascular;  Laterality: N/A;  . CAROTID ENDARTERECTOMY Right 03-2000   CE  . CAROTID ENDARTERECTOMY Left 11-24-11   CE  . COLONOSCOPY  04-2001   polyp  . [place], 2002, 2009, 2012   8 stents total  . [place]   neg  . ENDARTERECTOMY  11/24/2011   Procedure: ENDARTERECTOMY CAROTID;  Surgeon: Rosetta Posner, MD;  Location: [place];  Service: Vascular;  Laterality: Left;  . EYE SURGERY Bilateral Aug. 17, 2015   Cataract  . FUDUCIAL PLACEMENT N/A 12/25/2014   Procedure: PLACEMENT OF FUDUCIAL;  Surgeon: Collene Gobble, MD;  Location: [place];  Service: Thoracic;  Laterality: N/A;  . PROSTATE SURGERY     Radiation Tx  X's 40  . TEE WITHOUT CARDIOVERSION N/A 10/31/2015   Procedure: TRANSESOPHAGEAL ECHOCARDIOGRAM (TEE);  Surgeon: Sueanne Margarita, MD;  Location: [facility] ENDOSCOPY;  Service: Cardiovascular;  Laterality: N/A;  . VASCULAR SURGERY    . VIDEO BRONCHOSCOPY WITH ENDOBRONCHIAL NAVIGATION N/A 12/25/2014   Procedure: VIDEO BRONCHOSCOPY WITH ENDOBRONCHIAL  NAVIGATION  with Biopsies and Brushings;  Surgeon: Collene Gobble, MD;  Location: [place];  Service: Thoracic;  Laterality: N/A;  . VIDEO BRONCHOSCOPY WITH ENDOBRONCHIAL ULTRASOUND N/A 01/22/2015   Procedure: VIDEO BRONCHOSCOPY WITH ENDOBRONCHIAL ULTRASOUND with Biopsies;  Surgeon: Collene Gobble, MD;  Location: [place];  Service: Thoracic;  Laterality: N/A;    There were no vitals filed for this visit.      Subjective Assessment - 12/29/15 0846    Subjective Pt states that he went to his doctor's appointment the other day and was diagnosed with orthostatic hypotension. A little lightheaded when he got up this morning. Not lightheaded currently. No pain or discomfort currently. Still gets a little pain from his ribs but getting better.  1/10 rib pain at most for the  past 7 days.  Pt also states being less dizzy since his doctor's appointment.  Pt also adds taking "bo9" for his vertigo. MD does not know about it.  Was able to walk about 1.5 hours at [facility] 2 days ago. Was tired but not lightheaded or dizzy.    Pertinent History R rib fractures x5 and hemothorax. Injury occured 10/14/2015. Pt was up using the restroom at 3 am, blacked out, and fell. Went to the hospital, was transferred to [facility] trauma center. Right now, ribs feel ok.  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  Works 12 hours a day, 2x a week or 3x a week. Had an MD appointment 12/01/2015 and had x-rays which showed rib fracture healing and hemothorax. Pt states that his MD said that he does not have pneumonia. Pt also states feeling numbness in his L hand, starting at his 5th digit which travels to his thumb and hand. Had 4 CT scans since July 2017 for his head, cervical spine, and chest area which rulled out a stroke. Just getting use back in his L hand. His MD also mentioned possible occupational therapy to get his motor skills more on point.  Pt states that he has fallen before after using the the bathroom. [facility] concludes that urinating causes his falls.   HTN controlled per pt. CA is treated and decreasing.    Patient Stated Goals "Get back to work. "    Currently in Pain? No/denies            [facility] PT Assessment - 12/29/15 0850      Strength   Right Hip Flexion 4+/5   Right Hip External Rotation  4+/5   Right Hip ABduction 5/5  supine clamshell position   Left Hip Flexion 4+/5   Left Hip External Rotation 4+/5   Left Hip ABduction 5/5  supine clamshell position   Right Knee Flexion 4+/5   Right Knee Extension 5/5   Left Knee Flexion 4+/5   Left Knee Extension 5/5   Right Ankle Dorsiflexion 4+/5   Right Ankle Plantar Flexion 5/5   Left Ankle Dorsiflexion 5/5   Left Ankle Plantar Flexion 5/5                              PT Education - 12/29/15 0849    Education provided Yes   Education Details ther-ex   Person(s) Educated Patient   Methods Explanation;Demonstration;Tactile cues;Verbal cues   Comprehension Returned demonstration;Verbalized understanding        Objectives  There-ex  Vitals obtained: BP  L arm sitting 119/67, HR 94 bpm (mechanically taken),   Directed patient with seated manually resisted hip flexion, knee flexion, knee extension, ankle DF, ankle PF, supine clamshell 1-2x each way for each LE   Reviewed progress/current status with LE strength with pt .  Sit <> supine 3x  "Lights spinning" with sit to supine initially but no more spinning then next 2 repetitions.   Pt was recommended to let his MD know about taking "bo 9" to make sure it is safe to do so. Pt verbalized understanding.    Seated ankle PF resisting blue band 10x3 each LE  Seated hip extension isometrics 10x5 seconds for 3 sets each LE  Standing heel toe raises 10x with bilateral UE assist,   Slight lightheadedness which eased with sitting rest.   Standing bilateral scapular retraction 10x5 seconds for 2 sets to promote posture, and standing tolerance.  NuStep seat 8, arms 8, Level 1 x 4 minutes to promote blood flow.   Improved exercise technique, movement at target joints, use of target muscles after mod verbal, visual, tactile cues.      Pt demonstrates overall improved LE strength since initial evaluation. Possible improvement with gait distance to greater than or equal to 4 blocks based on subjective of being able to walk around [employer] 2 days ago for about 1.5 hours. No dizziness at end of session when walking from the gym to his car. Pt appears to be more at ease today compared to previous sessions.                PT Long Term Goals - 12/03/15 2035      PT LONG TERM GOAL #1   Title Patient will improve bilateral LE strength by at least 1/2 MMT  grade to promote ability to perform standing tasks and decrease fall risk.    Time 4   Period Weeks   Status New     PT LONG TERM GOAL #2   Title Patient will be able to ambulate at least 4 blocks to promote mobility, and ability to perform job related tasks.    Baseline Pt able to ambulate 2 blocks   Time 4   Period Weeks   Status New               Plan - 12/29/15 8756    Clinical Impression Statement Pt demonstrates overall improved LE strength since initial evaluation. Possible improvement with gait distance to greater than or equal to 4 blocks based on subjective of being able to walk around [employer] 2 days ago for about 1.5 hours. No dizziness at end of session when walking from the gym to his car. Pt appears to be more at ease today compared to previous sessions.    Rehab Potential Good   Clinical Impairments Affecting Rehab Potential healing time   PT Frequency 2x / week   PT Duration 4 weeks   PT Treatment/Interventions Manual techniques;Therapeutic exercise;Balance training;Neuromuscular re-education;Patient/family education;Therapeutic activities;Functional mobility training;Gait training   PT Next Visit Plan LE strengthening   Consulted and Agree with Plan of Care Patient      Patient will benefit from skilled therapeutic intervention in order to improve the following deficits and impairments:  Pain, Decreased strength, Difficulty walking  Visit Diagnosis: Muscle weakness (generalized)  History of falling     Problem List Patient Active Problem List   Diagnosis Date Noted  . Left arm weakness 12/19/2015  . Numbness and tingling in left arm 12/19/2015  .  Left shoulder pain 12/19/2015  . History of recent fall 12/19/2015  . Paresthesia of arm 12/01/2015  . Depression 12/01/2015  . Malnutrition of moderate degree 11/20/2015  . TIA (transient ischemic attack) 11/19/2015  . Weakness 11/17/2015  . Physical deconditioning 11/10/2015  . Multiple rib fractures  11/10/2015  . Chronic systolic heart failure ([place]) 11/02/2015  . Atrial flutter with rapid ventricular response ([place]) 10/30/2015  . Atrial flutter ([place]) 10/30/2015  . B12 deficiency 07/22/2015  . Anemia 07/15/2015  . Hematuria 07/08/2015  . Unintentional weight loss 07/08/2015  . Hypotension 07/08/2015  . COPD with emphysema ([place]) 05/22/2015  . Cough 05/22/2015  . Tobacco user 05/22/2015  . Mediastinal lymphadenopathy 01/22/2015  . 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 S/P bronchoscopy with biopsy   . Colon cancer screening 11/06/2014  . Encounter for Medicare annual wellness exam 11/06/2014  . Routine general medical examination at a health care facility 11/06/2014  . Lung nodule 06/28/2014  . Carotid stenosis 12/20/2013  . Aftercare following surgery of the circulatory system 12/20/2013  . Aftercare following surgery of the circulatory system, [place] 12/19/2012  . Personal history of colonic polyps 08/28/2012  . Hyperglycemia 08/07/2012  . Pre-operative cardiovascular examination 11/03/2011  . Carotid artery disease ([place]) 05/20/2011  . Grief reaction 05/07/2011  . Ischemic cardiomyopathy 01/13/2011  . Palpitations 11/10/2010  . Occlusion and stenosis of carotid artery without mention of cerebral infarction 04/02/2010  . Anxiety disorder 12/12/2009  . HYPERTENSION, BENIGN 09/29/2008  . History of prostate cancer 09/19/2008  . Hyperlipidemia 02/10/2007  . AMAUROSIS FUGAX 02/10/2007  . MYOCARDIAL INFARCTION, HX OF 02/10/2007  . Coronary atherosclerosis 02/10/2007  . PERIPHERAL VASCULAR DISEASE 02/10/2007  . HEMORRHOIDS 02/10/2007  . Chronic obstructive pulmonary disease ([place]) 02/10/2007  . OSTEOARTHRITIS 02/10/2007  . Nicotine dependence 02/10/2007  . Pure hypercholesterolemia 01/27/2007  . CORONARY ATHEROSCLEROSIS NATIVE CORONARY ARTERY 01/27/2007    Joneen Boers PT, DPT   12/29/2015, 9:40 AM  [place] [facility] [address], [place], [address] Phone: [phone]   Fax:  [phone]  Name: KEYMANI MCLEAN MRN: [id number] Date of Birth: October 07, 1940

## 2016-01-01 ENCOUNTER — Ambulatory Visit: Payer: Medicare Other

## 2016-01-01 VITALS — BP 104/60 | HR 102

## 2016-01-01 DIAGNOSIS — Z9181 History of falling: Secondary | ICD-10-CM

## 2016-01-01 DIAGNOSIS — M6281 Muscle weakness (generalized): Secondary | ICD-10-CM

## 2016-01-01 NOTE — Therapy (Signed)
[facility] [address], [place], [address] Phone: [phone]   Fax:  [phone]  Physical Therapy Treatment And Progress Report  Patient Details  Name: [name] MRN: [id number] Date of Birth: Dec 04, 1940 Referring Provider: Abner Greenspan, MD  Encounter Date: 01/01/2016      PT End of Session - 01/01/16 0802    Visit Number 7   Number of Visits 9   Date for PT Re-Evaluation 01/29/16   Authorization Type 1   Authorization Time Period of 10   PT Start Time 0802   PT Stop Time 0903   PT Time Calculation (min) 61 min   Equipment Utilized During Treatment Gait belt   Activity Tolerance Patient tolerated treatment well   Behavior During Therapy [facility] for tasks assessed/performed      Past Medical History:  Diagnosis Date  . AAA (abdominal aortic aneurysm) (HCC)    4.2 cm IR AAA noted on 01/13/15 PET scan  . Anxiety   . CAD (coronary artery disease)    AMI 1997 w/ BMS x 2 LAD, BMS CFX 2002, DES x 2 LAD 2009, DES CFX & OM 2012   . Carotid artery disease ([place])    RICA CEA 9480, LICA CEA 1655 per Dr. [name],   . CHF (congestive heart failure) ([place])    pt denied in 2014  . COPD (chronic obstructive pulmonary disease) ([place])   . GERD (gastroesophageal reflux disease)    occ  . HTN (hypertension)   . Hydradenitis   . Hyperlipidemia   . Ischemic cardiomyopathy    EF 40-45 percent at cath 2012  . Myocardial infarction   . Non-small cell lung cancer (NSCLC) ([place]) dx'd 12/2014   SBRT  . Osteoarthritis    pt. denies 01/17/2015  . PAC (premature atrial contraction)   . Peripheral vascular disease ([place])   . Pneumonia   . Prostate cancer ([place]) dx'd approx 2008   xrt throughfoley  . Radiation 02/18/15-02/25/15   right upper lung 54 Gy  . Shortness of breath dyspnea   . Stroke [facility]) 11-24-11   "no feeling in right 5th digit"  . Urinary frequency   . Urinary urgency     Past Surgical History:   Procedure Laterality Date  . CARDIAC CATHETERIZATION    . CARDIOVASCULAR STRESS TEST  12-2006  . CARDIOVERSION N/A 10/31/2015   Procedure: CARDIOVERSION;  Surgeon: Sueanne Margarita, MD;  Location: [facility];  Service: Cardiovascular;  Laterality: N/A;  . CAROTID ENDARTERECTOMY Right 03-2000   CE  . CAROTID ENDARTERECTOMY Left 11-24-11   CE  . COLONOSCOPY  04-2001   polyp  . [place], 2002, 2009, 2012   8 stents total  . [place]   neg  . ENDARTERECTOMY  11/24/2011   Procedure: ENDARTERECTOMY CAROTID;  Surgeon: Rosetta Posner, MD;  Location: [facility];  Service: Vascular;  Laterality: Left;  . EYE SURGERY Bilateral Aug. 17, 2015   Cataract  . FUDUCIAL PLACEMENT N/A 12/25/2014   Procedure: PLACEMENT OF FUDUCIAL;  Surgeon: Collene Gobble, MD;  Location: [facility];  Service: Thoracic;  Laterality: N/A;  . PROSTATE SURGERY     Radiation Tx  X's 40  . TEE WITHOUT CARDIOVERSION N/A 10/31/2015   Procedure: TRANSESOPHAGEAL ECHOCARDIOGRAM (TEE);  Surgeon: Sueanne Margarita, MD;  Location: [facility] ENDOSCOPY;  Service: Cardiovascular;  Laterality: N/A;  . VASCULAR SURGERY    . VIDEO BRONCHOSCOPY WITH  ENDOBRONCHIAL NAVIGATION N/A 12/25/2014   Procedure: VIDEO BRONCHOSCOPY WITH ENDOBRONCHIAL NAVIGATION  with Biopsies and Brush[place];  Surgeon: Collene Gobble, MD;  Location: [facility];  Service: Thoracic;  Laterality: N/A;  . VIDEO BRONCHOSCOPY WITH ENDOBRONCHIAL ULTRASOUND N/A 01/22/2015   Procedure: VIDEO BRONCHOSCOPY WITH ENDOBRONCHIAL ULTRASOUND with Biopsies;  Surgeon: Collene Gobble, MD;  Location: [facility] OR;  Service: Thoracic;  Laterality: N/A;    Vitals:   01/01/16 0805  BP: 104/60  Pulse: (!) 102        Subjective Assessment - 01/01/16 0806    Subjective Pt states feeling "so-so." Has not felt very well this morning. More lightheaded than usual. The room does not spin. Feels like he has to sit down.  No lightheadedness right now. Felt lightheaded when he first got up for  15-20 minutes.  Lightheadedness comes on a little quicker.  Feels like physical therapy is helping some. Has not done much walking since [employer] this past weekend.  Pt states that he was instructed to sit down when urinating. Missed his seat this morning when trying to use the bathroom and ended up sitting on the floor about 3 am this morning. No rib pain currently. Just feels pain when he lays on it (R S/L). 2-3/10 R rib pain at most for the past 7 days.  Has an appointment with his cardiologist in 2 weeks.   Pt states drinking ensure this morning.     Pertinent History R rib fractures x5 and hemothorax. Injury occured 10/14/2015. Pt was up using the restroom at 3 am, blacked out, and fell. Went to the hospital, [facility]. Right now, ribs feel ok.  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  Works 12 hours a day, 2x a week or 3x a week. Had an MD appointment 12/01/2015 and had x-rays which showed rib fracture healing and hemothorax. Pt states that his MD said that he does not have pneumonia. Pt also states feeling numbness in his L hand, starting at his 5th digit which travels to his thumb and hand. Had 4 CT scans since July 2017 for his head, cervical spine, and chest area which rulled out a stroke. Just getting use back in his L hand. His MD also mentioned possible occupational therapy to get his motor skills more on point.  Pt states that he has fallen before after using the the bathroom. Pt MDs at [facility] concludes that urinating causes his falls.   HTN controlled per pt. CA is treated and decreasing.    Patient Stated Goals "Get back to work. "    Currently in Pain? No/denies   Pain Score 0-No pain          [facility] PT Assessment - 01/01/16 0927      Strength   Overall Strength Comments Strength measured on 12/29/2015   Right Hip Flexion 4+/5   Right Hip External Rotation  4+/5   Right Hip ABduction 5/5  supine  clamshell position   Left Hip Flexion 4+/5   Left Hip External Rotation 4+/5   Left Hip ABduction 5/5  supine clamshell position   Right Knee Flexion 4+/5   Right Knee Extension 5/5   Left Knee Flexion 4+/5   Left Knee Extension 5/5   Right Ankle Dorsiflexion 4+/5   Right Ankle Plantar Flexion 5/5   Left Ankle Dorsiflexion 5/5   Left Ankle Plantar Flexion 5/5  PT Education - 01/01/16 0804    Education provided Yes   Education Details ther-ex   Person(s) Educated Patient   Methods Explanation;Demonstration;Tactile cues;Verbal cues   Comprehension Returned demonstration;Verbalized understanding        Objectives  There-ex    Vitals obtained (mechanically taken). See vitals section   NuStep seat 8, arms 8, Level 1 x 5 minutes to promote blood flow. (not billed)  SpO2 97% room air, HR 105    Standing heel raises 5x, then 10x2  Slight dizziness which subsided with sitting rest break x 15 seconds  Seated leg press resisting green band 10x3 each LE   Standing hip abduction 5x each LE  Feels like the room is spinning. Took 5 seconds to occur. Disappeared with sitting rest.   Static standing 53 seconds  Felt a weird feeling in his head after standing. Eases with about 10-15 seconds of sitting rest.   Static standing 56 seconds.   Lightheaded which eases after about 10-15 seconds of sitting rest.  Sitting blood pressure L arm: 110/72 manually taken  Static standing 37 seconds. Unable to get a good reading for manual blood pressure. Dizziness. Decreased with sitting  Static standing 56 seconds. Unable to get a manual blood pressure reading. Could not hear pulse from stethoscope.   Static standing with mechanical blood pressure cuff for about 1 min 35 seconds L arm, then dizziness.   Pt had to sit down prior to the machine finishing Machine finished while pt sitting: 88/ 49, HR 102.  Pt states having an appointment  with his Cardiologist: Dr. [name] on 01/26/2016 Reviewed plan of care: continue PT until Cardiologist appointment, working on maintaining strength and function, performing sitting exercises, with standing exercises as tolerated while monitoring vital signs.    Improved exercise technique, movement at target joints, use of target muscles after min to mod verbal, visual, tactile cues.     Pt demonstrates overall improved LE strength since initial evaluation. Possible improvement with gait distance based on pt subjective of being able to walk around [place] last weekend for about 1.5 hours. Pt however demonstrates difficulty tolerating standing positions greater than about 1 min on average prior to onset of dizziness. Difficulty obtaining blood pressure reading both manually (unable to hear pulse from stethoscope) and mechanically in standing. Pt has a cardiologist appointment on 01/26/2016 per schedule and would benefit from continued skilled gentle physical therapy intervention to maintain LE strength, and to decrease fall risk until the week of his appointment then reassess plan of care from then.           PT Long Term Goals - 01/01/16 0930      PT LONG TERM GOAL #1   Title Patient will improve bilateral LE strength by at least 1/2 MMT grade to promote ability to perform standing tasks and decrease fall risk.    Time 4   Period Weeks   Status Partially Met     PT LONG TERM GOAL #2   Title Patient will be able to ambulate at least 4 blocks to promote mobility, and ability to perform job related tasks.    Baseline Pt able to ambulate 2 blocks. Able to walk about 1.5 hours at [place] last weekend (01/01/2016)   Time 4   Period Weeks   Status On-going               Plan - 01/01/16 0925    Clinical Impression Statement Pt demonstrates overall improved LE strength since  initial evaluation. Possible improvement with gait distance based on pt subjective of being able to walk around  [place] last weekend for about 1.5 hours. Pt however demonstrates difficulty tolerating standing positions greater than about 1 min on average prior to onset of dizziness. Difficulty obtaining blood pressure reading both manually (unable to hear pulse from stethoscope) and mechanically in standing. Pt has a cardiologist appointment on 01/26/2016 per schedule and would benefit from continued skilled gentle physical therapy intervention to maintain LE strength, and to decrease fall risk until the week of his appointment then reassess plan of care from then.    Rehab Potential Good   Clinical Impairments Affecting Rehab Potential healing time   PT Frequency 2x / week   PT Duration 4 weeks   PT Treatment/Interventions Manual techniques;Therapeutic exercise;Balance training;Neuromuscular re-education;Patient/family education;Therapeutic activities;Functional mobility training;Gait training   PT Next Visit Plan LE strengthening   Consulted and Agree with Plan of Care Patient      Patient will benefit from skilled therapeutic intervention in order to improve the following deficits and impairments:  Pain, Decreased strength, Difficulty walking  Visit Diagnosis: Muscle weakness (generalized) - Plan: PT plan of care cert/re-cert  History of falling - Plan: PT plan of care cert/re-cert       G-Codes - 01/11/16 1951    Functional Assessment Tool Used  patient interview, clinical presentation   Functional Limitation Mobility: Walking and moving around   Mobility: Walking and Moving Around Current Status [phone]) At least 20 percent but less than 40 percent impaired, limited or restricted   Mobility: Walking and Moving Around Goal Status [phone]) At least 1 percent but less than 20 percent impaired, limited or restricted      Problem List Patient Active Problem List   Diagnosis Date Noted  . Left arm weakness 12/19/2015  . Numbness and tingling in left arm 12/19/2015  . Left shoulder pain 12/19/2015   . History of recent fall 12/19/2015  . Paresthesia of arm 12/01/2015  . Depression 12/01/2015  . Malnutrition of moderate degree 11/20/2015  . TIA (transient ischemic attack) 11/19/2015  . Weakness 11/17/2015  . Physical deconditioning 11/10/2015  . Multiple rib fractures 11/10/2015  . Chronic systolic heart failure ([place]) 11/02/2015  . Atrial flutter with rapid ventricular response ([place]) 10/30/2015  . Atrial flutter ([place]) 10/30/2015  . B12 deficiency 07/22/2015  . Anemia 07/15/2015  . Hematuria 07/08/2015  . Unintentional weight loss 07/08/2015  . Hypotension 07/08/2015  . COPD with emphysema ([place]) 05/22/2015  . Cough 05/22/2015  . Tobacco user 05/22/2015  . Mediastinal lymphadenopathy 01/22/2015  . 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 S/P bronchoscopy with biopsy   . Colon cancer screening 11/06/2014  . Encounter for Medicare annual wellness exam 11/06/2014  . Routine general medical examination at a health care facility 11/06/2014  . Lung nodule 06/28/2014  . Carotid stenosis 12/20/2013  . Aftercare following surgery of the circulatory system 12/20/2013  . Aftercare following surgery of the circulatory system, [place] 12/19/2012  . Personal history of colonic polyps 08/28/2012  . Hyperglycemia 08/07/2012  . Pre-operative cardiovascular examination 11/03/2011  . Carotid artery disease ([place]) 05/20/2011  . Grief reaction 05/07/2011  . Ischemic cardiomyopathy 01/13/2011  . Palpitations 11/10/2010  . Occlusion and stenosis of carotid artery without mention of cerebral infarction 04/02/2010  . Anxiety disorder 12/12/2009  . HYPERTENSION, BENIGN 09/29/2008  . History of prostate cancer 09/19/2008  . Hyperlipidemia 02/10/2007  . AMAUROSIS FUGAX 02/10/2007  . MYOCARDIAL INFARCTION,  HX OF 02/10/2007  . Coronary atherosclerosis 02/10/2007  . PERIPHERAL VASCULAR DISEASE 02/10/2007  . HEMORRHOIDS 02/10/2007  . Chronic obstructive pulmonary disease ([place]) 02/10/2007  .  OSTEOARTHRITIS 02/10/2007  . Nicotine dependence 02/10/2007  . Pure hypercholesterolemia 01/27/2007  . CORONARY ATHEROSCLEROSIS NATIVE CORONARY ARTERY 01/27/2007   Thank you for your referral.  Joneen Boers PT, DPT   01/01/2016, 7:57 PM  [facility] [address] [place], [address] Phone: [phone]   Fax:  [phone]  Name: [name] MRN: [id number] Date of Birth: Jul 08, 1940

## 2016-01-05 ENCOUNTER — Ambulatory Visit: Payer: Medicare Other

## 2016-01-05 DIAGNOSIS — M6281 Muscle weakness (generalized): Secondary | ICD-10-CM

## 2016-01-05 DIAGNOSIS — Z9181 History of falling: Secondary | ICD-10-CM | POA: Diagnosis not present

## 2016-01-05 NOTE — Therapy (Signed)
[place] [facility] [address] [place], [address] Phone: [phone]   Fax:  [phone]  Physical Therapy Treatment  Patient Details  Name: [name] MRN: [id number] Date of Birth: [birth year] Referring Provider: Abner Greenspan, MD  Encounter Date: 01/05/2016      PT End of Session - 01/05/16 0802    Visit Number 8   Number of Visits 17   Date for PT Re-Evaluation 01/29/16   Authorization Type 2   Authorization Time Period of 10   PT Start Time 0802   PT Stop Time 0847   PT Time Calculation (min) 45 min   Equipment Utilized During Treatment Gait belt   Activity Tolerance Patient tolerated treatment well   Behavior During Therapy [facility] for tasks assessed/performed      Past Medical History:  Diagnosis Date  . AAA (abdominal aortic aneurysm) (HCC)    4.2 cm IR AAA noted on 01/13/15 PET scan  . Anxiety   . CAD (coronary artery disease)    AMI 1997 w/ BMS x 2 LAD, BMS CFX 2002, DES x 2 LAD 2009, DES CFX & OM 2012   . Carotid artery disease ([place])    RICA CEA 0100, LICA CEA 7121 per Dr. [name],   . CHF (congestive heart failure) ([place])    pt denied in 2014  . COPD (chronic obstructive pulmonary disease) ([place])   . GERD (gastroesophageal reflux disease)    occ  . HTN (hypertension)   . Hydradenitis   . Hyperlipidemia   . Ischemic cardiomyopathy    EF 40-45 percent at cath 2012  . Myocardial infarction   . Non-small cell lung cancer (NSCLC) ([place]) dx'd 12/2014   SBRT  . Osteoarthritis    pt. denies 01/17/2015  . PAC (premature atrial contraction)   . Peripheral vascular disease ([place])   . Pneumonia   . Prostate cancer ([place]) dx'd approx 2008   xrt throughfoley  . Radiation 02/18/15-02/25/15   right upper lung 54 Gy  . Shortness of breath dyspnea   . Stroke [facility]) 11-24-11   "no feeling in right 5th digit"  . Urinary frequency   . Urinary urgency     Past Surgical History:  Procedure Laterality  Date  . CARDIAC CATHETERIZATION    . CARDIOVASCULAR STRESS TEST  12-2006  . CARDIOVERSION N/A 10/31/2015   Procedure: CARDIOVERSION;  Surgeon: Sueanne Margarita, MD;  Location: [place];  Service: Cardiovascular;  Laterality: N/A;  . CAROTID ENDARTERECTOMY Right 03-2000   CE  . CAROTID ENDARTERECTOMY Left 11-24-11   CE  . COLONOSCOPY  04-2001   polyp  . [place], 2002, 2009, 2012   8 stents total  . [place]   neg  . ENDARTERECTOMY  11/24/2011   Procedure: ENDARTERECTOMY CAROTID;  Surgeon: Rosetta Posner, MD;  Location: [place];  Service: Vascular;  Laterality: Left;  . EYE SURGERY Bilateral Aug. 17, 2015   Cataract  . FUDUCIAL PLACEMENT N/A 12/25/2014   Procedure: PLACEMENT OF FUDUCIAL;  Surgeon: Collene Gobble, MD;  Location: [place];  Service: Thoracic;  Laterality: N/A;  . PROSTATE SURGERY     Radiation Tx  X's 40  . TEE WITHOUT CARDIOVERSION N/A 10/31/2015   Procedure: TRANSESOPHAGEAL ECHOCARDIOGRAM (TEE);  Surgeon: Sueanne Margarita, MD;  Location: [facility] ENDOSCOPY;  Service: Cardiovascular;  Laterality: N/A;  . VASCULAR SURGERY    . VIDEO BRONCHOSCOPY WITH ENDOBRONCHIAL NAVIGATION N/A  12/25/2014   Procedure: VIDEO BRONCHOSCOPY WITH ENDOBRONCHIAL NAVIGATION  with Biopsies and Brushings;  Surgeon: Collene Gobble, MD;  Location: [place];  Service: Thoracic;  Laterality: N/A;  . VIDEO BRONCHOSCOPY WITH ENDOBRONCHIAL ULTRASOUND N/A 01/22/2015   Procedure: VIDEO BRONCHOSCOPY WITH ENDOBRONCHIAL ULTRASOUND with Biopsies;  Surgeon: Collene Gobble, MD;  Location: [place];  Service: Thoracic;  Laterality: N/A;    There were no vitals filed for this visit.      Subjective Assessment - 01/05/16 0807    Subjective Not too bad. Dizziness is on and off this morning. No pain or discomfort currently.    Pertinent History R rib fractures x5 and hemothorax. Injury occured 10/14/2015. Pt was up using the restroom at 3 am, blacked out, and fell. Went to the hospital, was  transferred to [facility] trauma center. Right now, ribs feel ok.  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  Works 12 hours a day, 2x a week or 3x a week. Had an MD appointment 12/01/2015 and had x-rays which showed rib fracture healing and hemothorax. Pt states that his MD said that he does not have pneumonia. Pt also states feeling numbness in his L hand, starting at his 5th digit which travels to his thumb and hand. Had 4 CT scans since July 2017 for his head, cervical spine, and chest area which rulled out a stroke. Just getting use back in his L hand. His MD also mentioned possible occupational therapy to get his motor skills more on point.  Pt states that he has fallen before after using the the bathroom. Pt MDs at [facility] concludes that urinating causes his falls.   HTN controlled per pt. CA is treated and decreasing.    Patient Stated Goals "Get back to work. "    Currently in Pain? No/denies   Pain Score 0-No pain                                 PT Education - 01/05/16 0808    Education provided Yes   Education Details ther-ex   Person(s) Educated Patient   Methods Explanation;Demonstration;Tactile cues;Verbal cues   Comprehension Verbalized understanding;Returned demonstration        Objectives  There-ex  Vitals obtained BP L arm sitting (mechanically taken) 107/58, HR 91 BPM  Directed patient with NuStep seat 8, arms 8, Level 1 x 5 minutes. Unbilled.   Seated knee extension 10x3 resisting 2 lbs   seated knee flexion resisting red band 10x3each LE  seated hip adductor small physioball squeeze 10x3 with 5 second holds 50% effort     Seated clamshells resisting red band (hips < 90 degrees flexion) 10x3  Recommended patient to hold off the standing heel raises HEP secondary to overall symptoms of dizziness but to continue his sitting exercises such as the seated T-band ankle PF. Pt verbalized  understanding.   Seated manually resisted leg press 10x3 each LE  Seated bilateral scapular retraction 10x3 with 5 second holds  Seated biceps curls resisting 1 lbs 10x2  Performed secondary to pt being able to stand an extra 30 seconds when the BP machine was at his biceps trying to measure vitals last session  Seated hand ball squeeze 5x5 seconds for 3 sets. Medium level effort.  Difficulty with L hand   Improved exercise technique, movement at target joints, use of target  muscles after mod verbal, visual, tactile cues.    Performed exercises in sitting today to promote LE and UE strengthening (for full body musculovenous pump use) with less symptoms of dizziness. No complain of dizziness or lightheadedness with seated exercises.           PT Long Term Goals - 01/01/16 0930      PT LONG TERM GOAL #1   Title Patient will improve bilateral LE strength by at least 1/2 MMT grade to promote ability to perform standing tasks and decrease fall risk.    Time 4   Period Weeks   Status Partially Met     PT LONG TERM GOAL #2   Title Patient will be able to ambulate at least 4 blocks to promote mobility, and ability to perform job related tasks.    Baseline Pt able to ambulate 2 blocks. Able to walk about 1.5 hours at [facility] last weekend (01/01/2016)   Time 4   Period Weeks   Status On-going               Plan - 01/05/16 0803    Clinical Impression Statement Performed exercises in sitting today to promote LE and UE strengthening (for full body musculovenous pump use) with less symptoms of dizziness. No complain of dizziness or lightheadedness with seated exercises.    Rehab Potential Good   Clinical Impairments Affecting Rehab Potential healing time   PT Frequency 2x / week   PT Duration 4 weeks   PT Treatment/Interventions Manual techniques;Therapeutic exercise;Balance training;Neuromuscular re-education;Patient/family education;Therapeutic activities;Functional mobility  training;Gait training   PT Next Visit Plan LE strengthening   Consulted and Agree with Plan of Care Patient      Patient will benefit from skilled therapeutic intervention in order to improve the following deficits and impairments:  Pain, Decreased strength, Difficulty walking  Visit Diagnosis: Muscle weakness (generalized)  History of falling     Problem List Patient Active Problem List   Diagnosis Date Noted  . Left arm weakness 12/19/2015  . Numbness and tingling in left arm 12/19/2015  . Left shoulder pain 12/19/2015  . History of recent fall 12/19/2015  . Paresthesia of arm 12/01/2015  . Depression 12/01/2015  . Malnutrition of moderate degree 11/20/2015  . TIA (transient ischemic attack) 11/19/2015  . Weakness 11/17/2015  . Physical deconditioning 11/10/2015  . Multiple rib fractures 11/10/2015  . Chronic systolic heart failure ([place]) 11/02/2015  . Atrial flutter with rapid ventricular response ([place]) 10/30/2015  . Atrial flutter ([place]) 10/30/2015  . B12 deficiency 07/22/2015  . Anemia 07/15/2015  . Hematuria 07/08/2015  . Unintentional weight loss 07/08/2015  . Hypotension 07/08/2015  . COPD with emphysema ([place]) 05/22/2015  . Cough 05/22/2015  . Tobacco user 05/22/2015  . Mediastinal lymphadenopathy 01/22/2015  . 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 S/P bronchoscopy with biopsy   . Colon cancer screening 11/06/2014  . Encounter for Medicare annual wellness exam 11/06/2014  . Routine general medical examination at a health care facility 11/06/2014  . Lung nodule 06/28/2014  . Carotid stenosis 12/20/2013  . Aftercare following surgery of the circulatory system 12/20/2013  . Aftercare following surgery of the circulatory system, [place] 12/19/2012  . Personal history of colonic polyps 08/28/2012  . Hyperglycemia 08/07/2012  . Pre-operative cardiovascular examination 11/03/2011  . Carotid artery disease ([place]) 05/20/2011  . Grief reaction 05/07/2011  .  Ischemic cardiomyopathy 01/13/2011  . Palpitations 11/10/2010  . Occlusion and stenosis of carotid artery without  mention of cerebral infarction 04/02/2010  . Anxiety disorder 12/12/2009  . HYPERTENSION, BENIGN 09/29/2008  . History of prostate cancer 09/19/2008  . Hyperlipidemia 02/10/2007  . AMAUROSIS FUGAX 02/10/2007  . MYOCARDIAL INFARCTION, HX OF 02/10/2007  . Coronary atherosclerosis 02/10/2007  . PERIPHERAL VASCULAR DISEASE 02/10/2007  . HEMORRHOIDS 02/10/2007  . Chronic obstructive pulmonary disease ([place]) 02/10/2007  . OSTEOARTHRITIS 02/10/2007  . Nicotine dependence 02/10/2007  . Pure hypercholesterolemia 01/27/2007  . CORONARY ATHEROSCLEROSIS NATIVE CORONARY ARTERY 01/27/2007   Joneen Boers PT, DPT   01/05/2016, 8:55 AM  [place] [facility] [address] [place], [address] Phone: [phone]   Fax:  [phone]  Name: [name] MRN: [id number] Date of Birth: 06/12/1940

## 2016-01-05 NOTE — Patient Instructions (Signed)
Recommended patient to hold off the standing heel raises HEP secondary to overall symptoms of dizziness but to continue his sitting exercises such as the seated T-band ankle PF. Pt verbalized understanding.

## 2016-01-07 ENCOUNTER — Ambulatory Visit: Payer: Medicare Other

## 2016-01-07 ENCOUNTER — Other Ambulatory Visit: Payer: Self-pay | Admitting: Oncology

## 2016-01-07 DIAGNOSIS — Z9181 History of falling: Secondary | ICD-10-CM

## 2016-01-07 DIAGNOSIS — M6281 Muscle weakness (generalized): Secondary | ICD-10-CM | POA: Diagnosis not present

## 2016-01-07 DIAGNOSIS — C3411 Malignant neoplasm of upper lobe, right bronchus or lung: Secondary | ICD-10-CM

## 2016-01-07 NOTE — Therapy (Signed)
[place] [facility] [address], [place], [address] Phone: [phone]   Fax:  [phone]  Physical Therapy Treatment  Patient Details  Name: [name] MRN: [id number] Date of Birth: 07/17/1940 Referring Provider: Abner Greenspan, MD  Encounter Date: 01/07/2016      PT End of Session - 01/07/16 0802    Visit Number 9   Number of Visits 17   Date for PT Re-Evaluation 01/29/16   Authorization Type 3   Authorization Time Period of 10   PT Start Time 0802   PT Stop Time 0845   PT Time Calculation (min) 43 min   Equipment Utilized During Treatment Gait belt   Activity Tolerance Patient tolerated treatment well   Behavior During Therapy [facility] for tasks assessed/performed      Past Medical History:  Diagnosis Date  . AAA (abdominal aortic aneurysm) (HCC)    4.2 cm IR AAA noted on 01/13/15 PET scan  . Anxiety   . CAD (coronary artery disease)    AMI 1997 w/ BMS x 2 LAD, BMS CFX 2002, DES x 2 LAD 2009, DES CFX & OM 2012   . Carotid artery disease ([place])    RICA CEA 5885, LICA CEA 0277 per Dr. [name],   . CHF (congestive heart failure) ([place])    pt denied in 2014  . COPD (chronic obstructive pulmonary disease) ([place])   . GERD (gastroesophageal reflux disease)    occ  . HTN (hypertension)   . Hydradenitis   . Hyperlipidemia   . Ischemic cardiomyopathy    EF 40-45 percent at cath 2012  . Myocardial infarction   . Non-small cell lung cancer (NSCLC) ([place]) dx'd 12/2014   SBRT  . Osteoarthritis    pt. denies 01/17/2015  . PAC (premature atrial contraction)   . Peripheral vascular disease ([place])   . Pneumonia   . Prostate cancer ([place]) dx'd approx 2008   xrt throughfoley  . Radiation 02/18/15-02/25/15   right upper lung 54 Gy  . Shortness of breath dyspnea   . Stroke [facility]) 11-24-11   "no feeling in right 5th digit"  . Urinary frequency   . Urinary urgency     Past Surgical History:  Procedure Laterality  Date  . CARDIAC CATHETERIZATION    . CARDIOVASCULAR STRESS TEST  12-2006  . CARDIOVERSION N/A 10/31/2015   Procedure: CARDIOVERSION;  Surgeon: Sueanne Margarita, MD;  Location: [place];  Service: Cardiovascular;  Laterality: N/A;  . CAROTID ENDARTERECTOMY Right 03-2000   CE  . CAROTID ENDARTERECTOMY Left 11-24-11   CE  . COLONOSCOPY  04-2001   polyp  . [place], 2002, 2009, 2012   8 stents total  . [place]   neg  . ENDARTERECTOMY  11/24/2011   Procedure: ENDARTERECTOMY CAROTID;  Surgeon: Rosetta Posner, MD;  Location: [place];  Service: Vascular;  Laterality: Left;  . EYE SURGERY Bilateral Aug. 17, 2015   Cataract  . FUDUCIAL PLACEMENT N/A 12/25/2014   Procedure: PLACEMENT OF FUDUCIAL;  Surgeon: Collene Gobble, MD;  Location: [place];  Service: Thoracic;  Laterality: N/A;  . PROSTATE SURGERY     Radiation Tx  X's 40  . TEE WITHOUT CARDIOVERSION N/A 10/31/2015   Procedure: TRANSESOPHAGEAL ECHOCARDIOGRAM (TEE);  Surgeon: Sueanne Margarita, MD;  Location: [facility] ENDOSCOPY;  Service: Cardiovascular;  Laterality: N/A;  . VASCULAR SURGERY    . VIDEO BRONCHOSCOPY WITH ENDOBRONCHIAL NAVIGATION N/A  12/25/2014   Procedure: VIDEO BRONCHOSCOPY WITH ENDOBRONCHIAL NAVIGATION  with Biopsies and Brushings;  Surgeon: Collene Gobble, MD;  Location: [place];  Service: Thoracic;  Laterality: N/A;  . VIDEO BRONCHOSCOPY WITH ENDOBRONCHIAL ULTRASOUND N/A 01/22/2015   Procedure: VIDEO BRONCHOSCOPY WITH ENDOBRONCHIAL ULTRASOUND with Biopsies;  Surgeon: Collene Gobble, MD;  Location: [place];  Service: Thoracic;  Laterality: N/A;    There were no vitals filed for this visit.      Subjective Assessment - 01/07/16 0808    Subjective Woke up with a painless migraine. Mentioned it once or twice to his doctor. It usually lasts for 15-20 minutes. Does not have it right now. Gets dizzy or lightheaded when he first gets up. Lasted until he got a cup of coffee in him. Not dizzy or lightheaded at  the moment. No falls recently.  No pain or discomfort currently.    Pertinent History R rib fractures x5 and hemothorax. Injury occured 10/14/2015. Pt was up using the restroom at 3 am, blacked out, and fell. Went to the hospital, was transferred to [facility] trauma center. Right now, ribs feel ok.  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  Works 12 hours a day, 2x a week or 3x a week. Had an MD appointment 12/01/2015 and had x-rays which showed rib fracture healing and hemothorax. Pt states that his MD said that he does not have pneumonia. Pt also states feeling numbness in his L hand, starting at his 5th digit which travels to his thumb and hand. Had 4 CT scans since July 2017 for his head, cervical spine, and chest area which rulled out a stroke. Just getting use back in his L hand. His MD also mentioned possible occupational therapy to get his motor skills more on point.  Pt states that he has fallen before after using the the bathroom. Pt MDs at [facility] concludes that urinating causes his falls.   HTN controlled per pt. CA is treated and decreasing.    Patient Stated Goals "Get back to work. "    Currently in Pain? No/denies   Pain Score 0-No pain                                 PT Education - 01/07/16 0811    Education provided Yes   Education Details ther-ex   Person(s) Educated Patient   Methods Explanation;Demonstration;Tactile cues;Verbal cues   Comprehension Returned demonstration;Verbalized understanding        Objectives  There-ex  Vitals obtained BP L arm sitting (mechanically taken) 111/63, HR 96 BPM  Directed patient with NuStep seat 8, arms 8, Level 1 x 5 minutes. Unbilled.   Seated manually resisted leg press 10x3 each LE   seated knee flexion resisting green band  (upgrade, medium level effort per pt) 10x3each LE  seated hip adductor small physioball squeeze 10x3 with 5 second holds  50% effort  Seated knee extension 10x3 resisting 2 lbs    Seated with hips less than 90 degrees flexion, sitting on elevated mat table:   Seated clamshells resisting red band (hips < 90 degrees flexion) 10x3  Seated biceps curls resisting 1 lbs 10x3   Seated bilateral scapular retraction 10x3 with 5 second holds  Improved exercise technique, movement at target joints, use of target muscles after min to mod verbal, visual, tactile cues.  Performed exercises between sitting and standing (sitting on elevated mat table) to help promote ability to tolerate the standing posture with hopefully less symptoms of dizziness. Pt tolerated session well without complain of dizziness. Good muscle use felt with exercises.         PT Long Term Goals - 01/01/16 0930      PT LONG TERM GOAL #1   Title Patient will improve bilateral LE strength by at least 1/2 MMT grade to promote ability to perform standing tasks and decrease fall risk.    Time 4   Period Weeks   Status Partially Met     PT LONG TERM GOAL #2   Title Patient will be able to ambulate at least 4 blocks to promote mobility, and ability to perform job related tasks.    Baseline Pt able to ambulate 2 blocks. Able to walk about 1.5 hours at [facility] last weekend (01/01/2016)   Time 4   Period Weeks   Status On-going               Plan - 01/07/16 6073    Clinical Impression Statement Performed exercises between sitting and standing (sitting on elevated mat table) to help promote ability to tolerate the standing posture with hopefully less symptoms of dizziness. Pt tolerated session well without complain of dizziness. Good muscle use felt with exercises.    Rehab Potential Good   Clinical Impairments Affecting Rehab Potential healing time   PT Frequency 2x / week   PT Duration 4 weeks   PT Treatment/Interventions Manual techniques;Therapeutic exercise;Balance training;Neuromuscular re-education;Patient/family  education;Therapeutic activities;Functional mobility training;Gait training   PT Next Visit Plan LE strengthening   Consulted and Agree with Plan of Care Patient      Patient will benefit from skilled therapeutic intervention in order to improve the following deficits and impairments:  Pain, Decreased strength, Difficulty walking  Visit Diagnosis: Muscle weakness (generalized)  History of falling     Problem List Patient Active Problem List   Diagnosis Date Noted  . Left arm weakness 12/19/2015  . Numbness and tingling in left arm 12/19/2015  . Left shoulder pain 12/19/2015  . History of recent fall 12/19/2015  . Paresthesia of arm 12/01/2015  . Depression 12/01/2015  . Malnutrition of moderate degree 11/20/2015  . TIA (transient ischemic attack) 11/19/2015  . Weakness 11/17/2015  . Physical deconditioning 11/10/2015  . Multiple rib fractures 11/10/2015  . Chronic systolic heart failure ([place]) 11/02/2015  . Atrial flutter with rapid ventricular response ([place]) 10/30/2015  . Atrial flutter ([place]) 10/30/2015  . B12 deficiency 07/22/2015  . Anemia 07/15/2015  . Hematuria 07/08/2015  . Unintentional weight loss 07/08/2015  . Hypotension 07/08/2015  . COPD with emphysema ([place]) 05/22/2015  . Cough 05/22/2015  . Tobacco user 05/22/2015  . Mediastinal lymphadenopathy 01/22/2015  . 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 S/P bronchoscopy with biopsy   . Colon cancer screening 11/06/2014  . Encounter for Medicare annual wellness exam 11/06/2014  . Routine general medical examination at a health care facility 11/06/2014  . Lung nodule 06/28/2014  . Carotid stenosis 12/20/2013  . Aftercare following surgery of the circulatory system 12/20/2013  . Aftercare following surgery of the circulatory system, [place] 12/19/2012  . Personal history of colonic polyps 08/28/2012  . Hyperglycemia 08/07/2012  . Pre-operative cardiovascular examination 11/03/2011  . Carotid artery  disease ([place]) 05/20/2011  . Grief reaction 05/07/2011  . Ischemic cardiomyopathy 01/13/2011  . Palpitations 11/10/2010  . Occlusion  and stenosis of carotid artery without mention of cerebral infarction 04/02/2010  . Anxiety disorder 12/12/2009  . HYPERTENSION, BENIGN 09/29/2008  . History of prostate cancer 09/19/2008  . Hyperlipidemia 02/10/2007  . AMAUROSIS FUGAX 02/10/2007  . MYOCARDIAL INFARCTION, HX OF 02/10/2007  . Coronary atherosclerosis 02/10/2007  . PERIPHERAL VASCULAR DISEASE 02/10/2007  . HEMORRHOIDS 02/10/2007  . Chronic obstructive pulmonary disease ([place]) 02/10/2007  . OSTEOARTHRITIS 02/10/2007  . Nicotine dependence 02/10/2007  . Pure hypercholesterolemia 01/27/2007  . CORONARY ATHEROSCLEROSIS NATIVE CORONARY ARTERY 01/27/2007    Joneen Boers PT, DPT   01/07/2016, 7:35 PM  [facility] [address] [place], [address] Phone: [phone]   Fax:  [phone]  Name: MEER REINDL MRN: [id number] Date of Birth: 1940-12-18

## 2016-01-08 ENCOUNTER — Encounter: Payer: Self-pay | Admitting: Emergency Medicine

## 2016-01-08 ENCOUNTER — Telehealth: Payer: Self-pay | Admitting: *Deleted

## 2016-01-08 ENCOUNTER — Ambulatory Visit (INDEPENDENT_AMBULATORY_CARE_PROVIDER_SITE_OTHER): Payer: Medicare Other | Admitting: Emergency Medicine

## 2016-01-08 DIAGNOSIS — J449 Chronic obstructive pulmonary disease, unspecified: Secondary | ICD-10-CM

## 2016-01-08 DIAGNOSIS — C3411 Malignant neoplasm of upper lobe, right bronchus or lung: Secondary | ICD-10-CM | POA: Diagnosis not present

## 2016-01-08 MED ORDER — FLUTICASONE-SALMETEROL 500-50 MCG/DOSE IN AEPB
1.0000 | INHALATION_SPRAY | Freq: Two times a day (BID) | RESPIRATORY_TRACT | 0 refills | Status: DC
Start: 2016-01-08 — End: 2016-02-24

## 2016-01-08 NOTE — Telephone Encounter (Signed)
CALLED PATIENT TO INFORM OF STAT LABS ON 01-30-16 @ 12:45 PM @ [facility], SPOKE WITH PATIENT AND HE AGREED TO THIS.

## 2016-01-08 NOTE — Patient Instructions (Signed)
We need to get you back on your Spiriva daily, Advair twice a day  Use your albuterol 2 puffs up to every 4 hours if needed for shortness of breath.  Keep duoNeb available to use as needed Review your CT chest with Dr Sondra Come in November as planned.  Follow with Dr Lamonte Sakai in 4 months or sooner if you have any problems.

## 2016-01-08 NOTE — Assessment & Plan Note (Signed)
New and enlarging pulmonary nodules noted on his CT scan from August 2017. He has plans to follow with repeat scan in November. He may need referral to oncology for chemotherapy. He will f/u with rad/onc to review the results.

## 2016-01-08 NOTE — Progress Notes (Signed)
Subjective:  Patient ID: [name], male    DOB: Jul 21, 1940, 75 y.o.   MRN: [id number] HPI 75 yo smoker (~100pk-yrs), hx of CAD/PTCI, HTN,  prostate CA. Dx with COPD around 10 yrs ago. Last seen 2007 by PW. Maintanance was Advair bid. Previously on Spiriva, stopped in 2008.     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 His most recent CT scan of the chest was done on 07/16/15 and showed a relatively stable right upper lobe cavitary nodule, slightly smaller 3 mm right upper lobe nodule, slightly larger 4 mm right lower lobe nodule. He recently underwent CSY for rectal bleeding, had polypectomies done. He believes that his breathing is stable - he works, has some lower energy. He is stable on Advair and spiriva, works well for him. Uses ventolin about 1-2x a day, sometimes not at all. Occasional wheeze, has a lot of mucous to clear.   ROV 12/1915 -- Follow-up visit for history of severe COPD and squamous cell lung cancer of the right upper lobe status post SBRT. His most recent CT scan of the chest was done on 10/27/15 and I personally reviewed the images. This showed for scattered enlarging bilateral pulmonary nodules. The largest of these was 7 mm in the right upper lobe. The primary cavitary right upper lobe lesion that was treated has not changed. He tells me that he has been hospitalized 4-5 times since our last visit, once for a fall w rib fx's. He has had some A Fib, required cardioversion on one occasion. He has shown signs of vaso-vagal response and associated hypotension. He has reviewed his Ct scan with Rad/Onc, they are following the nodules, next scan in November. He is on spiriva but sometimes has a hard time obtaining it. He ran out of Advair two weeks ago. He uses nebulizer more freq off the maintenance meds. Does not use every day.    Objective:  Physical Exam Vitals:   01/08/16  1340  BP: 108/60  Pulse: 90  SpO2: 97%  Weight: 145 lb (65.8 kg)  Height: '5\' 8"'$  (1.727 m)    Gen: Pleasant man, in no distress,  normal affect  ENT: No lesions,  mouth clear,  oropharynx clear, no postnasal drip  Neck: No JVD, no TMG, no carotid bruits  Lungs: No use of accessory muscles, coarse w some insp and ext rhonchi, no wheezing  Cardiovascular: RRR, heart sounds normal, no murmur or gallops, no peripheral edema  Musculoskeletal: No deformities, no cyanosis or clubbing  Neuro: alert, non focal  Skin: Warm, no lesions or rashes    07/16/15 --  COMPARISON: CT 04/15/2015, PET-CT 01/13/2015  FINDINGS: Mediastinum/Nodes: No axillary or supraclavicular lymphadenopathy. No mediastinal or hilar lymphadenopathy. No pericardial fluid. Coronary calcifications are present. Esophagus is normal. No central pulmonary embolism.  Lungs/Pleura: Small RIGHT upper lobe angular cavitary nodule measuring 12 mm x 13 mm compares to 15 x 13 mm on prior for no interval enlargement. Subpleural nodule in the RIGHT lower lobe measures 4 mm (image 56, series 4) which measures slightly larger than 3 mm on comparison exam remeasured.  3 mm nodule in the RIGHT upper lobe (image 39, series 4) decreased from 4 mm.  Upper abdomen: Limited view of the upper abdomen demonstrates a partially calcified lymph node in the gastrohepatic ligament measuring 17 mm and unchanged. Adrenal glands are normal.  Musculoskeletal: No aggressive osseous lesion.  IMPRESSION:  1. Stable angular cavitary nodule in the RIGHT upper lobe. 2. Peripheral nodule in the RIGHT lower lobe measure slightly larger. Recommend attention on follow-up.   Assessment & Plan:  Chronic obstructive pulmonary disease ([place]) He has not had any evidence of exacerbation. His breathing is somewhat more limited because having difficulty obtaining his chronic medications. We will try to help him with samples if able.  We need to  get you back on your Spiriva daily, Advair twice a day  Use your albuterol 2 puffs up to every 4 hours if needed for shortness of breath.  Keep duoNeb available to use as needed  T1N0 NSCLC of the right upper lobe New and enlarging pulmonary nodules noted on his CT scan from August 2017. He has plans to follow with repeat scan in November. He may need referral to oncology for chemotherapy. He will f/u with rad/onc to review the results.     Baltazar Apo, MD, PhD 01/08/2016, 1:55 PM [facility] [phone] or if no answer [phone]

## 2016-01-08 NOTE — Addendum Note (Signed)
Addended by: Benson Setting L on: 01/08/2016 02:05 PM   Modules accepted: Orders

## 2016-01-08 NOTE — Assessment & Plan Note (Signed)
He has not had any evidence of exacerbation. His breathing is somewhat more limited because having difficulty obtaining his chronic medications. We will try to help him with samples if able.  We need to get you back on your Spiriva daily, Advair twice a day  Use your albuterol 2 puffs up to every 4 hours if needed for shortness of breath.  Keep duoNeb available to use as needed

## 2016-01-13 ENCOUNTER — Encounter: Payer: Self-pay | Admitting: Cardiovascular Disease

## 2016-01-13 ENCOUNTER — Other Ambulatory Visit: Payer: Self-pay | Admitting: Cardiovascular Disease

## 2016-01-13 ENCOUNTER — Ambulatory Visit: Payer: Medicare Other

## 2016-01-13 VITALS — BP 111/62 | HR 92

## 2016-01-13 DIAGNOSIS — M6281 Muscle weakness (generalized): Secondary | ICD-10-CM

## 2016-01-13 DIAGNOSIS — Z9181 History of falling: Secondary | ICD-10-CM | POA: Diagnosis not present

## 2016-01-13 NOTE — Therapy (Signed)
[facility] [address] [place], [address] Phone: [phone]   Fax:  [phone]  Physical Therapy Treatment  Patient Details  Name: [name] MRN: [id number] Date of Birth: 1940-05-25 Referring Provider: Abner Greenspan, MD  Encounter Date: 01/13/2016      PT End of Session - 01/13/16 0925    Visit Number 10   Number of Visits 17   Date for PT Re-Evaluation 01/29/16   Authorization Type 4   Authorization Time Period of 10   PT Start Time 0925   PT Stop Time 1005   PT Time Calculation (min) 40 min   Equipment Utilized During Treatment Gait belt   Activity Tolerance Patient tolerated treatment well   Behavior During Therapy [facility] for tasks assessed/performed      Past Medical History:  Diagnosis Date  . AAA (abdominal aortic aneurysm) (HCC)    4.2 cm IR AAA noted on 01/13/15 PET scan  . Anxiety   . CAD (coronary artery disease)    AMI 1997 w/ BMS x 2 LAD, BMS CFX 2002, DES x 2 LAD 2009, DES CFX & OM 2012   . Carotid artery disease ([place])    RICA CEA 8341, LICA CEA 9622 per Dr. [name],   . CHF (congestive heart failure) ([place])    pt denied in 2014  . COPD (chronic obstructive pulmonary disease) ([place])   . GERD (gastroesophageal reflux disease)    occ  . HTN (hypertension)   . Hydradenitis   . Hyperlipidemia   . Ischemic cardiomyopathy    EF 40-45 percent at cath 2012  . Myocardial infarction   . Non-small cell lung cancer (NSCLC) ([employer]) dx'd 12/2014   SBRT  . Osteoarthritis    pt. denies 01/17/2015  . PAC (premature atrial contraction)   . Peripheral vascular disease ([place])   . Pneumonia   . Prostate cancer ([place]) dx'd approx 2008   xrt throughfoley  . Radiation 02/18/15-02/25/15   right upper lung 54 Gy  . Shortness of breath dyspnea   . Stroke [facility]) 11-24-11   "no feeling in right 5th digit"  . Urinary frequency   . Urinary urgency     Past Surgical History:  Procedure Laterality  Date  . CARDIAC CATHETERIZATION    . CARDIOVASCULAR STRESS TEST  12-2006  . CARDIOVERSION N/A 10/31/2015   Procedure: CARDIOVERSION;  Surgeon: Sueanne Margarita, MD;  Location: [facility];  Service: Cardiovascular;  Laterality: N/A;  . CAROTID ENDARTERECTOMY Right 03-2000   CE  . CAROTID ENDARTERECTOMY Left 11-24-11   CE  . COLONOSCOPY  04-2001   polyp  . [place], 2002, 2009, 2012   8 stents total  . [place]   neg  . ENDARTERECTOMY  11/24/2011   Procedure: ENDARTERECTOMY CAROTID;  Surgeon: Rosetta Posner, MD;  Location: [facility];  Service: Vascular;  Laterality: Left;  . EYE SURGERY Bilateral Aug. 17, 2015   Cataract  . FUDUCIAL PLACEMENT N/A 12/25/2014   Procedure: PLACEMENT OF FUDUCIAL;  Surgeon: Collene Gobble, MD;  Location: [facility];  Service: Thoracic;  Laterality: N/A;  . PROSTATE SURGERY     Radiation Tx  X's 40  . TEE WITHOUT CARDIOVERSION N/A 10/31/2015   Procedure: TRANSESOPHAGEAL ECHOCARDIOGRAM (TEE);  Surgeon: Sueanne Margarita, MD;  [facility] ENDOSCOPY;  Service: Cardiovascular;  Laterality: N/A;  . VASCULAR SURGERY    . VIDEO BRONCHOSCOPY WITH ENDOBRONCHIAL NAVIGATION N/A  12/25/2014   Procedure: VIDEO BRONCHOSCOPY WITH ENDOBRONCHIAL NAVIGATION  with Biopsies and Brushings;  Surgeon: Collene Gobble, MD;  Location: [place] [facility];  Service: Thoracic;  Laterality: N/A;  . VIDEO BRONCHOSCOPY WITH ENDOBRONCHIAL ULTRASOUND N/A 01/22/2015   Procedure: VIDEO BRONCHOSCOPY WITH ENDOBRONCHIAL ULTRASOUND with Biopsies;  Surgeon: Collene Gobble, MD;  Location: [facility];  Service: Thoracic;  Laterality: N/A;    Vitals:   01/13/16 0928  BP: 111/62  Pulse: 92        Subjective Assessment - 01/13/16 0925    Subjective Pt states feeling "so-so." The dizziness comes and goes. Alot of it might be coming from dehydration too. Just had a cup of coffee this morning. Did not drink or eat anything else.  No pain or dizziness currently.    Pertinent History R rib  fractures x5 and hemothorax. Injury occured 10/14/2015. Pt was up using the restroom at 3 am, blacked out, and fell. Went to the hospital, was transferred to [facility] trauma center. Right now, ribs feel ok.  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  Works 12 hours a day, 2x a week or 3x a week. Had an MD appointment 12/01/2015 and had x-rays which showed rib fracture healing and hemothorax. Pt states that his MD said that he does not have pneumonia. Pt also states feeling numbness in his L hand, starting at his 5th digit which travels to his thumb and hand. Had 4 CT scans since July 2017 for his head, cervical spine, and chest area which rulled out a stroke. Just getting use back in his L hand. His MD also mentioned possible occupational therapy to get his motor skills more on point.  Pt states that he has fallen before after using the the bathroom. Pt MDs at [facility] concludes that urinating causes his falls.   HTN controlled per pt. CA is treated and decreasing.    Patient Stated Goals "Get back to work. "    Currently in Pain? No/denies                                 PT Education - 01/13/16 0937    Education provided Yes   Education Details ther-ex   [employer]) Educated Patient   Methods Explanation;Demonstration;Tactile cues;Verbal cues   Comprehension Returned demonstration;Verbalized understanding        Objectives  There-ex  Pt was recommended again to eat breakfast prior to his PT appointments so he can have energy for exercises. Pt verbalized understanding.   Vitals obtained in sitting  Directed patient with seated knee flexion resisting greenband  (medium level effort per pt) 10x3each LE  Seated with hips less than 90 degrees flexion, sitting on elevated mat table:    Seated knee extension 10x3 resisting 2 lbs    Seated biceps curls resisting 1 lb 10x3   Seated manually resisted leg press  10x3 each LE      Seated bilateral scapular retraction 10x3 with 5 second holds              Seated clamshells resisting red band (hips < 90 degrees flexion) 10x3                       seated hip adductor small physioball squeeze 10x3      Improved exercise technique, movement at target joints, use of  target muscles after min to mod verbal, visual, tactile cues.     No complain of dizziness or pain throughout session. Worked on both UE and LE strengthening to promote musculovenous pump use throughout body. Performed majority of exercises with pt sitting on elevated mat table with feet on the floor (about half way between sitting and standing) to promote tolerance to positions similar to standing.          PT Long Term Goals - 01/01/16 0930      PT LONG TERM GOAL #1   Title Patient will improve bilateral LE strength by at least 1/2 MMT grade to promote ability to perform standing tasks and decrease fall risk.    Time 4   Period Weeks   Status Partially Met     PT LONG TERM GOAL #2   Title Patient will be able to ambulate at least 4 blocks to promote mobility, and ability to perform job related tasks.    Baseline Pt able to ambulate 2 blocks. Able to walk about 1.5 hours at [place] last weekend (01/01/2016)   Time 4   Period Weeks   Status On-going               Plan - 01/13/16 9528    Clinical Impression Statement No complain of dizziness or pain throughout session. Worked on both UE and LE strengthening to promote musculovenous pump use throughout body. Performed majority of exercises with pt sitting on elevated mat table with feet on the floor (about half way between sitting and standing) to promote tolerance to positions similar to standing.    Rehab Potential Good   Clinical Impairments Affecting Rehab Potential healing time   PT Frequency 2x / week   PT Duration 4 weeks   PT Treatment/Interventions Manual techniques;Therapeutic exercise;Balance  training;Neuromuscular re-education;Patient/family education;Therapeutic activities;Functional mobility training;Gait training   PT Next Visit Plan LE strengthening   Consulted and Agree with Plan of Care Patient      Patient will benefit from skilled therapeutic intervention in order to improve the following deficits and impairments:  Pain, Decreased strength, Difficulty walking  Visit Diagnosis: Muscle weakness (generalized)  History of falling     Problem List Patient Active Problem List   Diagnosis Date Noted  . Left arm weakness 12/19/2015  . Numbness and tingling in left arm 12/19/2015  . Left shoulder pain 12/19/2015  . History of recent fall 12/19/2015  . Paresthesia of arm 12/01/2015  . Depression 12/01/2015  . Malnutrition of moderate degree 11/20/2015  . TIA (transient ischemic attack) 11/19/2015  . Weakness 11/17/2015  . Physical deconditioning 11/10/2015  . Multiple rib fractures 11/10/2015  . Chronic systolic heart failure ([place]) 11/02/2015  . Atrial flutter with rapid ventricular response ([place]) 10/30/2015  . Atrial flutter ([place]) 10/30/2015  . B12 deficiency 07/22/2015  . Anemia 07/15/2015  . Hematuria 07/08/2015  . Unintentional weight loss 07/08/2015  . Hypotension 07/08/2015  . COPD with emphysema ([place]) 05/22/2015  . Cough 05/22/2015  . Tobacco user 05/22/2015  . Mediastinal lymphadenopathy 01/22/2015  . 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 S/P bronchoscopy with biopsy   . Colon cancer screening 11/06/2014  . Encounter for Medicare annual wellness exam 11/06/2014  . Routine general medical examination at a health care facility 11/06/2014  . Lung nodule 06/28/2014  . Carotid stenosis 12/20/2013  . Aftercare following surgery of the circulatory system 12/20/2013  . Aftercare following surgery of the circulatory system, [place] 12/19/2012  .  Personal history of colonic polyps 08/28/2012  . Hyperglycemia 08/07/2012  . Pre-operative  cardiovascular examination 11/03/2011  . Carotid artery disease ([place]) 05/20/2011  . Grief reaction 05/07/2011  . Ischemic cardiomyopathy 01/13/2011  . Palpitations 11/10/2010  . Occlusion and stenosis of carotid artery without mention of cerebral infarction 04/02/2010  . Anxiety disorder 12/12/2009  . HYPERTENSION, BENIGN 09/29/2008  . History of prostate cancer 09/19/2008  . Hyperlipidemia 02/10/2007  . AMAUROSIS FUGAX 02/10/2007  . MYOCARDIAL INFARCTION, HX OF 02/10/2007  . Coronary atherosclerosis 02/10/2007  . PERIPHERAL VASCULAR DISEASE 02/10/2007  . HEMORRHOIDS 02/10/2007  . Chronic obstructive pulmonary disease ([place]) 02/10/2007  . OSTEOARTHRITIS 02/10/2007  . Nicotine dependence 02/10/2007  . Pure hypercholesterolemia 01/27/2007  . CORONARY ATHEROSCLEROSIS NATIVE CORONARY ARTERY 01/27/2007    Joneen Boers PT, DPT   01/13/2016, 10:13 AM  [facility] [address], [place], [address] Phone: [phone]   Fax:  [phone]  Name: [name] MRN: [id number] Date of Birth: 12/03/1940

## 2016-01-15 NOTE — Telephone Encounter (Signed)
Please sent to primary care

## 2016-01-17 ENCOUNTER — Other Ambulatory Visit: Payer: Self-pay | Admitting: Emergency Medicine

## 2016-01-20 ENCOUNTER — Ambulatory Visit: Payer: Medicare Other

## 2016-01-20 VITALS — BP 121/72 | HR 100

## 2016-01-20 DIAGNOSIS — M6281 Muscle weakness (generalized): Secondary | ICD-10-CM | POA: Diagnosis not present

## 2016-01-20 DIAGNOSIS — Z9181 History of falling: Secondary | ICD-10-CM

## 2016-01-20 NOTE — Patient Instructions (Signed)
  Ankle Bend (Dorsiflexion and Plantar Flexion)    Sitting, point toes up, keeping both heels on floor. Then press toes to floor, raising heels. Hold each position for 5 seconds. Repeat __10_ times. Do __2-3__ sessions per day.  http://gt2.exer.us/403   Copyright  VHI. All rights reserved.

## 2016-01-20 NOTE — Therapy (Signed)
[facility] [address], [place], [address] Phone: [phone]   Fax:  [phone]  Physical Therapy Treatment  Patient Details  Name: [name] MRN: [id number] Date of Birth: 08-25-1940 Referring Provider: Abner Greenspan, MD  Encounter Date: 01/20/2016      PT End of Session - 01/20/16 1034    Visit Number 11   Number of Visits 17   Date for PT Re-Evaluation 01/29/16   Authorization Type 5   Authorization Time Period of 10   PT Start Time 1034   PT Stop Time 1114   PT Time Calculation (min) 40 min   Equipment Utilized During Treatment Gait belt   Activity Tolerance Patient tolerated treatment well   Behavior During Therapy WFL for tasks assessed/performed      Past Medical History:  Diagnosis Date  . AAA (abdominal aortic aneurysm) (HCC)    4.2 cm IR AAA noted on 01/13/15 PET scan  . Anxiety   . CAD (coronary artery disease)    AMI 1997 w/ BMS x 2 LAD, BMS CFX 2002, DES x 2 LAD 2009, DES CFX & OM 2012   . Carotid artery disease ([place])    RICA CEA 0086, LICA CEA 7619 per Dr. [name],   . CHF (congestive heart failure) ([place])    pt denied in 2014  . COPD (chronic obstructive pulmonary disease) ([place])   . GERD (gastroesophageal reflux disease)    occ  . HTN (hypertension)   . Hydradenitis   . Hyperlipidemia   . Ischemic cardiomyopathy    EF 40-45 percent at cath 2012  . Myocardial infarction   . Non-small cell lung cancer (NSCLC) ([place]) dx'd 12/2014   SBRT  . Osteoarthritis    pt. denies 01/17/2015  . PAC (premature atrial contraction)   . Peripheral vascular disease ([place])   . Pneumonia   . Prostate cancer ([place]) dx'd approx 2008   xrt throughfoley  . Radiation 02/18/15-02/25/15   right upper lung 54 Gy  . Shortness of breath dyspnea   . Stroke [facility]) 11-24-11   "no feeling in right 5th digit"  . Urinary frequency   . Urinary urgency     Past Surgical History:  Procedure Laterality  Date  . CARDIAC CATHETERIZATION    . CARDIOVASCULAR STRESS TEST  12-2006  . CARDIOVERSION N/A 10/31/2015   Procedure: CARDIOVERSION;  Surgeon: Sueanne Margarita, MD;  Location: [facility];  Service: Cardiovascular;  Laterality: N/A;  . CAROTID ENDARTERECTOMY Right 03-2000   CE  . CAROTID ENDARTERECTOMY Left 11-24-11   CE  . COLONOSCOPY  04-2001   polyp  . [place], 2002, 2009, 2012   8 stents total  . [place]   neg  . ENDARTERECTOMY  11/24/2011   Procedure: ENDARTERECTOMY CAROTID;  Surgeon: Rosetta Posner, MD;  Location: [facility];  Service: Vascular;  Laterality: Left;  . EYE SURGERY Bilateral Aug. 17, 2015   Cataract  . FUDUCIAL PLACEMENT N/A 12/25/2014   Procedure: PLACEMENT OF FUDUCIAL;  Surgeon: Collene Gobble, MD;  Location: [place] [facility];  Service: Thoracic;  Laterality: N/A;  . PROSTATE SURGERY     Radiation Tx  X's 40  . TEE WITHOUT CARDIOVERSION N/A 10/31/2015   Procedure: TRANSESOPHAGEAL ECHOCARDIOGRAM (TEE);  Surgeon: Sueanne Margarita, MD;  Location: [facility] ENDOSCOPY;  Service: Cardiovascular;  Laterality: N/A;  . VASCULAR SURGERY    . VIDEO BRONCHOSCOPY WITH ENDOBRONCHIAL NAVIGATION N/A  12/25/2014   Procedure: VIDEO BRONCHOSCOPY WITH ENDOBRONCHIAL NAVIGATION  with Biopsies and Brushings;  Surgeon: Collene Gobble, MD;  Location: [facility];  Service: Thoracic;  Laterality: N/A;  . VIDEO BRONCHOSCOPY WITH ENDOBRONCHIAL ULTRASOUND N/A 01/22/2015   Procedure: VIDEO BRONCHOSCOPY WITH ENDOBRONCHIAL ULTRASOUND with Biopsies;  Surgeon: Collene Gobble, MD;  Location: [place];  Service: Thoracic;  Laterality: N/A;    Vitals:   01/20/16 1038  BP: 121/72  Pulse: 100        Subjective Assessment - 01/20/16 1038    Subjective Pt states that it has not been a good day. Pt states tripping this morning, fell, and cut his R eye area. Pt just got up after sitting about 5 seconds at the side of his bed, walked to his back bedroom and tripped.  Does not remember which foot he  tripped on. No rib pain.  Was dizzy this morning but not now. Not dizzy when he fell.  Does not feel like he broke anything.    Pertinent History R rib fractures x5 and hemothorax. Injury occured 10/14/2015. Pt was up using the restroom at 3 am, blacked out, and fell. Went to the hospital, was transferred to [facility] trauma center. Right now, ribs feel ok.  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  Works 12 hours a day, 2x a week or 3x a week. Had an MD appointment 12/01/2015 and had x-rays which showed rib fracture healing and hemothorax. Pt states that his MD said that he does not have pneumonia. Pt also states feeling numbness in his L hand, starting at his 5th digit which travels to his thumb and hand. Had 4 CT scans since July 2017 for his head, cervical spine, and chest area which rulled out a stroke. Just getting use back in his L hand. His MD also mentioned possible occupational therapy to get his motor skills more on point.  Pt states that he has fallen before after using the the bathroom. Pt MDs at [facility] concludes that urinating causes his falls.   HTN controlled per pt. CA is treated and decreasing.    Patient Stated Goals "Get back to work. "    Currently in Pain? No/denies                                 PT Education - 01/20/16 1055    Education provided Yes   Education Details ther-ex, HEP, sitting at edge of bed for about 1 min prior to standing up to get his bearings   Person(s) Educated Patient   Methods Explanation;Demonstration;Tactile cues;Verbal cues;Handout   Comprehension Returned demonstration;Verbalized understanding        Objectives  There-ex   Pt was recommended to wait about 1 min in sitting prior to standing in the morning.  Pt verbalized understanding   Vitals obtained in sitting   Seated with hips less than 90 degrees flexion, sitting on elevated mat table (between  sitting and standing):     Seated clamshells resisting red band (hips <90 degrees flexion) 10x3     Seated manually resisted leg press 10x3 each LE   bilateral ankle DF 10x5 seconds for 2 sets              Seated knee extension 10x3 resisting 2 lbs  Seated biceps curls resisting 1 lb 10x3              Seated bilateral scapular retraction 10x3 with 5 second holds  seated knee flexion resisting greenband 10x3 each LE    seated hip adductor small physioball squeeze 10x3 with 5 second holds  Static standing x 2 min unsupported while vitals were being obtained.   Blood pressure: 66/28 HR 105 L arm standing, mechanically taken.  No complain of dizziness.   Improved exercise technique, movement at target joints, use of target muscles after min to mod verbal, visual, tactile cues.     Normal vital signs in sitting but levels drop to 66/28 L arm standing (mechanically taken) but no complain of dizziness. Pt was recommended to have his cardiologist check his vitals in standing as well. Pt verbalized understanding. No complain of dizziness throughout the session. Good muscle work felt with exercises per pt reports.           PT Long Term Goals - 01/01/16 0930      PT LONG TERM GOAL #1   Title Patient will improve bilateral LE strength by at least 1/2 MMT grade to promote ability to perform standing tasks and decrease fall risk.    Time 4   Period Weeks   Status Partially Met     PT LONG TERM GOAL #2   Title Patient will be able to ambulate at least 4 blocks to promote mobility, and ability to perform job related tasks.    Baseline Pt able to ambulate 2 blocks. Able to walk about 1.5 hours at [place] last weekend (01/01/2016)   Time 4   Period Weeks   Status On-going               Plan - 01/20/16 1057    Clinical Impression Statement Normal vital signs in sitting but levels drop to 66/28 L arm standing (mechanically taken)  but no complain of dizziness. Pt was recommended to have his cardiologist check his vitals in standing as well. Pt verbalized understanding. No complain of dizziness throughout the session. Good muscle work felt with exercises per pt reports.    Rehab Potential Good   Clinical Impairments Affecting Rehab Potential healing time   PT Frequency 2x / week   PT Duration 4 weeks   PT Treatment/Interventions Manual techniques;Therapeutic exercise;Balance training;Neuromuscular re-education;Patient/family education;Therapeutic activities;Functional mobility training;Gait training   PT Next Visit Plan LE strengthening   Consulted and Agree with Plan of Care Patient      Patient will benefit from skilled therapeutic intervention in order to improve the following deficits and impairments:  Pain, Decreased strength, Difficulty walking  Visit Diagnosis: Muscle weakness (generalized)  History of falling     Problem List Patient Active Problem List   Diagnosis Date Noted  . Left arm weakness 12/19/2015  . Numbness and tingling in left arm 12/19/2015  . Left shoulder pain 12/19/2015  . History of recent fall 12/19/2015  . Paresthesia of arm 12/01/2015  . Depression 12/01/2015  . Malnutrition of moderate degree 11/20/2015  . TIA (transient ischemic attack) 11/19/2015  . Weakness 11/17/2015  . Physical deconditioning 11/10/2015  . Multiple rib fractures 11/10/2015  . Chronic systolic heart failure ([place]) 11/02/2015  . Atrial flutter with rapid ventricular response ([place]) 10/30/2015  . Atrial flutter ([place]) 10/30/2015  . B12 deficiency 07/22/2015  . Anemia 07/15/2015  . Hematuria 07/08/2015  . Unintentional weight loss 07/08/2015  . Hypotension 07/08/2015  . COPD with emphysema (  [place]) 05/22/2015  . Cough 05/22/2015  . Tobacco user 05/22/2015  . Mediastinal lymphadenopathy 01/22/2015  . 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 S/P bronchoscopy with biopsy   . Colon cancer screening  11/06/2014  . Encounter for Medicare annual wellness exam 11/06/2014  . Routine general medical examination at a health care facility 11/06/2014  . Lung nodule 06/28/2014  . Carotid stenosis 12/20/2013  . Aftercare following surgery of the circulatory system 12/20/2013  . Aftercare following surgery of the circulatory system, [place] 12/19/2012  . Personal history of colonic polyps 08/28/2012  . Hyperglycemia 08/07/2012  . Pre-operative cardiovascular examination 11/03/2011  . Carotid artery disease ([place]) 05/20/2011  . Grief reaction 05/07/2011  . Ischemic cardiomyopathy 01/13/2011  . Palpitations 11/10/2010  . Occlusion and stenosis of carotid artery without mention of cerebral infarction 04/02/2010  . Anxiety disorder 12/12/2009  . HYPERTENSION, BENIGN 09/29/2008  . History of prostate cancer 09/19/2008  . Hyperlipidemia 02/10/2007  . AMAUROSIS FUGAX 02/10/2007  . MYOCARDIAL INFARCTION, HX OF 02/10/2007  . Coronary atherosclerosis 02/10/2007  . PERIPHERAL VASCULAR DISEASE 02/10/2007  . HEMORRHOIDS 02/10/2007  . Chronic obstructive pulmonary disease ([place]) 02/10/2007  . OSTEOARTHRITIS 02/10/2007  . Nicotine dependence 02/10/2007  . Pure hypercholesterolemia 01/27/2007  . CORONARY ATHEROSCLEROSIS NATIVE CORONARY ARTERY 01/27/2007   Joneen Boers PT, DPT   01/20/2016, 8:01 PM  [facility] [address], [place], [address] Phone: [phone]   Fax:  [phone]  Name: [name] MRN: [id number] Date of Birth: [birth year]

## 2016-01-23 ENCOUNTER — Emergency Department: Payer: Medicare Other

## 2016-01-23 ENCOUNTER — Other Ambulatory Visit: Payer: Self-pay

## 2016-01-23 ENCOUNTER — Observation Stay
Admission: EM | Admit: 2016-01-23 | Discharge: 2016-01-25 | Disposition: A | Discharge status: Home / Self Care | Payer: Medicare Other | Attending: Internal Medicine | Admitting: Internal Medicine

## 2016-01-23 ENCOUNTER — Encounter: Payer: Self-pay | Admitting: Emergency Medicine

## 2016-01-23 DIAGNOSIS — Z8546 Personal history of malignant neoplasm of prostate: Secondary | ICD-10-CM | POA: Diagnosis not present

## 2016-01-23 DIAGNOSIS — Z955 Presence of coronary angioplasty implant and graft: Secondary | ICD-10-CM | POA: Insufficient documentation

## 2016-01-23 DIAGNOSIS — M25512 Pain in left shoulder: Secondary | ICD-10-CM | POA: Diagnosis not present

## 2016-01-23 DIAGNOSIS — Z79899 Other long term (current) drug therapy: Secondary | ICD-10-CM | POA: Diagnosis not present

## 2016-01-23 DIAGNOSIS — F17210 Nicotine dependence, cigarettes, uncomplicated: Secondary | ICD-10-CM | POA: Insufficient documentation

## 2016-01-23 DIAGNOSIS — F329 Major depressive disorder, single episode, unspecified: Secondary | ICD-10-CM | POA: Diagnosis not present

## 2016-01-23 DIAGNOSIS — I2510 Atherosclerotic heart disease of native coronary artery without angina pectoris: Secondary | ICD-10-CM | POA: Diagnosis not present

## 2016-01-23 DIAGNOSIS — I255 Ischemic cardiomyopathy: Secondary | ICD-10-CM | POA: Diagnosis not present

## 2016-01-23 DIAGNOSIS — I5022 Chronic systolic (congestive) heart failure: Secondary | ICD-10-CM | POA: Insufficient documentation

## 2016-01-23 DIAGNOSIS — J449 Chronic obstructive pulmonary disease, unspecified: Secondary | ICD-10-CM | POA: Diagnosis not present

## 2016-01-23 DIAGNOSIS — I714 Abdominal aortic aneurysm, without rupture: Secondary | ICD-10-CM | POA: Insufficient documentation

## 2016-01-23 DIAGNOSIS — I252 Old myocardial infarction: Secondary | ICD-10-CM | POA: Diagnosis not present

## 2016-01-23 DIAGNOSIS — Z85118 Personal history of other malignant neoplasm of bronchus and lung: Secondary | ICD-10-CM | POA: Diagnosis not present

## 2016-01-23 DIAGNOSIS — Z7982 Long term (current) use of aspirin: Secondary | ICD-10-CM | POA: Insufficient documentation

## 2016-01-23 DIAGNOSIS — I491 Atrial premature depolarization: Secondary | ICD-10-CM | POA: Diagnosis not present

## 2016-01-23 DIAGNOSIS — I739 Peripheral vascular disease, unspecified: Secondary | ICD-10-CM | POA: Diagnosis not present

## 2016-01-23 DIAGNOSIS — Z8673 Personal history of transient ischemic attack (TIA), and cerebral infarction without residual deficits: Secondary | ICD-10-CM | POA: Insufficient documentation

## 2016-01-23 DIAGNOSIS — R1310 Dysphagia, unspecified: Secondary | ICD-10-CM | POA: Diagnosis not present

## 2016-01-23 DIAGNOSIS — I951 Orthostatic hypotension: Secondary | ICD-10-CM | POA: Diagnosis not present

## 2016-01-23 DIAGNOSIS — F419 Anxiety disorder, unspecified: Secondary | ICD-10-CM | POA: Insufficient documentation

## 2016-01-23 DIAGNOSIS — R911 Solitary pulmonary nodule: Secondary | ICD-10-CM | POA: Diagnosis not present

## 2016-01-23 DIAGNOSIS — Z923 Personal history of irradiation: Secondary | ICD-10-CM | POA: Diagnosis not present

## 2016-01-23 DIAGNOSIS — K219 Gastro-esophageal reflux disease without esophagitis: Secondary | ICD-10-CM | POA: Diagnosis not present

## 2016-01-23 DIAGNOSIS — Z7951 Long term (current) use of inhaled steroids: Secondary | ICD-10-CM | POA: Diagnosis not present

## 2016-01-23 DIAGNOSIS — I10 Essential (primary) hypertension: Secondary | ICD-10-CM | POA: Diagnosis not present

## 2016-01-23 DIAGNOSIS — E785 Hyperlipidemia, unspecified: Secondary | ICD-10-CM | POA: Insufficient documentation

## 2016-01-23 DIAGNOSIS — I4892 Unspecified atrial flutter: Secondary | ICD-10-CM | POA: Insufficient documentation

## 2016-01-23 DIAGNOSIS — Z7902 Long term (current) use of antithrombotics/antiplatelets: Secondary | ICD-10-CM | POA: Diagnosis not present

## 2016-01-23 DIAGNOSIS — I110 Hypertensive heart disease with heart failure: Secondary | ICD-10-CM | POA: Insufficient documentation

## 2016-01-23 DIAGNOSIS — R55 Syncope and collapse: Secondary | ICD-10-CM | POA: Diagnosis present

## 2016-01-23 DIAGNOSIS — E860 Dehydration: Secondary | ICD-10-CM | POA: Insufficient documentation

## 2016-01-23 DIAGNOSIS — R0602 Shortness of breath: Secondary | ICD-10-CM

## 2016-01-23 LAB — CBC WITH DIFFERENTIAL/PLATELET
Basophils Absolute: 0.1 10*3/uL (ref 0–0.1)
Basophils Relative: 1 %
EOS PCT: 1 %
Eosinophils Absolute: 0.1 10*3/uL (ref 0–0.7)
HCT: 35.5 % — ABNORMAL LOW (ref 40.0–52.0)
Hemoglobin: 12.5 g/dL — ABNORMAL LOW (ref 13.0–18.0)
LYMPHS ABS: 0.6 10*3/uL — AB (ref 1.0–3.6)
LYMPHS PCT: 5 %
MCH: 33.3 pg (ref 26.0–34.0)
MCHC: 35.1 g/dL (ref 32.0–36.0)
MCV: 94.8 fL (ref 80.0–100.0)
MONO ABS: 0.5 10*3/uL (ref 0.2–1.0)
MONOS PCT: 5 %
Neutro Abs: 9.1 10*3/uL — ABNORMAL HIGH (ref 1.4–6.5)
Neutrophils Relative %: 88 %
PLATELETS: 310 10*3/uL (ref 150–440)
RBC: 3.75 MIL/uL — AB (ref 4.40–5.90)
RDW: 14.4 % (ref 11.5–14.5)
WBC: 10.3 10*3/uL (ref 3.8–10.6)

## 2016-01-23 LAB — LIPASE, BLOOD: Lipase: 20 U/L (ref 11–51)

## 2016-01-23 LAB — URINALYSIS COMPLETE WITH MICROSCOPIC (ARMC ONLY)
Bacteria, UA: NONE SEEN
Bilirubin Urine: NEGATIVE
GLUCOSE, UA: NEGATIVE mg/dL
Leukocytes, UA: NEGATIVE
Nitrite: NEGATIVE
PH: 7 (ref 5.0–8.0)
Protein, ur: 30 mg/dL — AB
SQUAMOUS EPITHELIAL / LPF: NONE SEEN
Specific Gravity, Urine: 1.013 (ref 1.005–1.030)

## 2016-01-23 LAB — COMPREHENSIVE METABOLIC PANEL
ALBUMIN: 3.7 g/dL (ref 3.5–5.0)
ALK PHOS: 66 U/L (ref 38–126)
ALT: 10 U/L — AB (ref 17–63)
AST: 19 U/L (ref 15–41)
Anion gap: 9 (ref 5–15)
BILIRUBIN TOTAL: 0.7 mg/dL (ref 0.3–1.2)
BUN: 12 mg/dL (ref 6–20)
CALCIUM: 9.1 mg/dL (ref 8.9–10.3)
CO2: 26 mmol/L (ref 22–32)
CREATININE: 0.89 mg/dL (ref 0.61–1.24)
Chloride: 102 mmol/L (ref 101–111)
GFR calc Af Amer: >60 mL/min (ref >60)
GFR calc non Af Amer: >60 mL/min (ref >60)
GLUCOSE: 112 mg/dL — AB (ref 65–99)
Potassium: 3.7 mmol/L (ref 3.5–5.1)
SODIUM: 137 mmol/L (ref 135–145)
TOTAL PROTEIN: 6.9 g/dL (ref 6.5–8.1)

## 2016-01-23 LAB — TROPONIN I: Troponin I: <0.03 ng/mL (ref <0.03)

## 2016-01-23 MED ORDER — TIOTROPIUM BROMIDE MONOHYDRATE 18 MCG IN CAPS
18.0000 ug | ORAL_CAPSULE | Freq: Every day | RESPIRATORY_TRACT | Status: DC
  Administered 2016-01-23 – 2016-01-25 (×2): 18 ug via RESPIRATORY_TRACT
  Filled 2016-01-23: qty 5

## 2016-01-23 MED ORDER — INFLUENZA VAC SPLIT QUAD 0.5 ML IM SUSY: 0.5000 mL | PREFILLED_SYRINGE | INTRAMUSCULAR | Status: DC

## 2016-01-23 MED ORDER — ACETAMINOPHEN 325 MG PO TABS
650.0000 mg | ORAL_TABLET | Freq: Four times a day (QID) | ORAL | Status: DC | PRN
  Administered 2016-01-23 – 2016-01-25 (×3): 650 mg via ORAL
  Filled 2016-01-23 (×3): qty 2

## 2016-01-23 MED ORDER — LORATADINE 10 MG PO TABS
10.0000 mg | ORAL_TABLET | Freq: Every day | ORAL | Status: DC
  Administered 2016-01-24 – 2016-01-25 (×2): 10 mg via ORAL
  Filled 2016-01-23 (×2): qty 1

## 2016-01-23 MED ORDER — HYDROCODONE-ACETAMINOPHEN 5-325 MG PO TABS: 1.0000 | ORAL_TABLET | ORAL | Status: DC | PRN

## 2016-01-23 MED ORDER — SODIUM CHLORIDE 0.9 % IV SOLN
INTRAVENOUS | Status: DC
  Administered 2016-01-23 – 2016-01-24 (×2): via INTRAVENOUS

## 2016-01-23 MED ORDER — FAMOTIDINE 20 MG PO TABS: 20.0000 mg | ORAL_TABLET | Freq: Two times a day (BID) | ORAL | 0 refills | Status: DC

## 2016-01-23 MED ORDER — ADULT MULTIVITAMIN W/MINERALS CH
1.0000 | ORAL_TABLET | Freq: Every day | ORAL | Status: DC
  Administered 2016-01-24 – 2016-01-25 (×2): 1 via ORAL
  Filled 2016-01-23 (×2): qty 1

## 2016-01-23 MED ORDER — HEPARIN SODIUM (PORCINE) 5000 UNIT/ML IJ SOLN
5000.0000 [IU] | Freq: Three times a day (TID) | INTRAMUSCULAR | Status: DC
  Administered 2016-01-23 – 2016-01-24 (×3): 5000 [IU] via SUBCUTANEOUS
  Filled 2016-01-23 (×3): qty 1

## 2016-01-23 MED ORDER — IPRATROPIUM-ALBUTEROL 0.5-2.5 (3) MG/3ML IN SOLN: 3.0000 mL | RESPIRATORY_TRACT | Status: DC | PRN

## 2016-01-23 MED ORDER — ALBUTEROL SULFATE (2.5 MG/3ML) 0.083% IN NEBU: 2.5000 mg | INHALATION_SOLUTION | RESPIRATORY_TRACT | Status: DC | PRN

## 2016-01-23 MED ORDER — TAMSULOSIN HCL 0.4 MG PO CAPS
0.4000 mg | ORAL_CAPSULE | Freq: Every day | ORAL | Status: DC
  Administered 2016-01-23 – 2016-01-24 (×2): 0.4 mg via ORAL
  Filled 2016-01-23 (×2): qty 1

## 2016-01-23 MED ORDER — ACETAMINOPHEN 650 MG RE SUPP: 650.0000 mg | Freq: Four times a day (QID) | RECTAL | Status: DC | PRN

## 2016-01-23 MED ORDER — SODIUM CHLORIDE 0.9 % IV BOLUS (SEPSIS)
1000.0000 mL | Freq: Once | INTRAVENOUS | Status: AC
  Administered 2016-01-23: 1000 mL via INTRAVENOUS

## 2016-01-23 MED ORDER — ASPIRIN EC 81 MG PO TBEC
81.0000 mg | DELAYED_RELEASE_TABLET | Freq: Every day | ORAL | Status: DC
  Administered 2016-01-23 – 2016-01-24 (×2): 81 mg via ORAL
  Filled 2016-01-23 (×2): qty 1

## 2016-01-23 MED ORDER — MOMETASONE FURO-FORMOTEROL FUM 200-5 MCG/ACT IN AERO: 2.0000 | INHALATION_SPRAY | Freq: Two times a day (BID) | RESPIRATORY_TRACT | Status: DC

## 2016-01-23 MED ORDER — DOCUSATE SODIUM 100 MG PO CAPS
100.0000 mg | ORAL_CAPSULE | Freq: Two times a day (BID) | ORAL | Status: DC
  Administered 2016-01-23 – 2016-01-25 (×4): 100 mg via ORAL
  Filled 2016-01-23 (×4): qty 1

## 2016-01-23 MED ORDER — ONDANSETRON HCL 4 MG PO TABS: 4.0000 mg | ORAL_TABLET | Freq: Four times a day (QID) | ORAL | Status: DC | PRN

## 2016-01-23 MED ORDER — METOCLOPRAMIDE HCL 5 MG PO TABS: 5.0000 mg | ORAL_TABLET | Freq: Three times a day (TID) | ORAL | 0 refills | Status: DC

## 2016-01-23 MED ORDER — ONDANSETRON HCL 4 MG/2ML IJ SOLN
4.0000 mg | Freq: Four times a day (QID) | INTRAMUSCULAR | Status: DC | PRN
Start: 2016-01-23 — End: 2016-01-25

## 2016-01-23 MED ORDER — EZETIMIBE 10 MG PO TABS
10.0000 mg | ORAL_TABLET | Freq: Every day | ORAL | Status: DC
  Administered 2016-01-23 – 2016-01-25 (×3): 10 mg via ORAL
  Filled 2016-01-23 (×3): qty 1

## 2016-01-23 MED ORDER — MOMETASONE FURO-FORMOTEROL FUM 200-5 MCG/ACT IN AERO
2.0000 | INHALATION_SPRAY | Freq: Two times a day (BID) | RESPIRATORY_TRACT | Status: DC
  Administered 2016-01-23 – 2016-01-25 (×4): 2 via RESPIRATORY_TRACT
  Filled 2016-01-23: qty 8.8

## 2016-01-23 MED ORDER — TRAZODONE HCL 50 MG PO TABS: 25.0000 mg | ORAL_TABLET | Freq: Every evening | ORAL | Status: DC | PRN

## 2016-01-23 MED ORDER — SODIUM CHLORIDE 0.9 % IV BOLUS (SEPSIS)
500.0000 mL | Freq: Once | INTRAVENOUS | Status: AC
  Administered 2016-01-23: 500 mL via INTRAVENOUS

## 2016-01-23 MED ORDER — NITROGLYCERIN 0.4 MG SL SUBL: 0.4000 mg | SUBLINGUAL_TABLET | SUBLINGUAL | Status: DC | PRN

## 2016-01-23 MED ORDER — CLOPIDOGREL BISULFATE 75 MG PO TABS
75.0000 mg | ORAL_TABLET | Freq: Every day | ORAL | Status: DC
Start: 2016-01-23 — End: 2016-01-25
  Administered 2016-01-23 – 2016-01-25 (×3): 75 mg via ORAL
  Filled 2016-01-23 (×3): qty 1

## 2016-01-23 NOTE — ED Provider Notes (Signed)
[facility] Emergency Department Provider Note  ____________________________________________  Time seen: Approximately 2:44 PM  I have reviewed the triage vital signs and the nursing notes.   HISTORY  Chief Complaint Loss of Consciousness    HPI [name] is a 75 y.o. male brought to the ED due to syncope 4 today. The patient reports she has not been eating or drinking much for the past several months and has been losing weight. Patient states he gets full very easily. Denies vomiting or diarrhea. No fevers chills chest pain or shortness of breath. No headaches. Denies any acute pain before after the syncopal episodes. He just states that when he changes position he gets very lightheaded. Has a history of lung cancer which is in remission for the past 2 years     Past Medical History:  Diagnosis Date  . AAA (abdominal aortic aneurysm) (HCC)    4.2 cm IR AAA noted on 01/13/15 PET scan  . Anxiety   . CAD (coronary artery disease)    AMI 1997 w/ BMS x 2 LAD, BMS CFX 2002, DES x 2 LAD 2009, DES CFX & OM 2012   . Carotid artery disease ([place])    RICA CEA 0258, LICA CEA 5277 per Dr. [name],   . CHF (congestive heart failure) ([place])    pt denied in 2014  . COPD (chronic obstructive pulmonary disease) ([place])   . GERD (gastroesophageal reflux disease)    occ  . HTN (hypertension)   . Hydradenitis   . Hyperlipidemia   . Ischemic cardiomyopathy    EF 40-45 percent at cath 2012  . Myocardial infarction   . Non-small cell lung cancer (NSCLC) ([place]) dx'd 12/2014   SBRT  . Osteoarthritis    pt. denies 01/17/2015  . PAC (premature atrial contraction)   . Peripheral vascular disease ([place])   . Pneumonia   . Prostate cancer ([place]) dx'd approx 2008   xrt throughfoley  . Radiation 02/18/15-02/25/15   right upper lung 54 Gy  . Shortness of breath dyspnea   . Stroke [facility]) 11-24-11   "no feeling in right 5th digit"  . Urinary frequency   . Urinary urgency       Patient Active Problem List   Diagnosis Date Noted  . Left arm weakness 12/19/2015  . Numbness and tingling in left arm 12/19/2015  . Left shoulder pain 12/19/2015  . History of recent fall 12/19/2015  . Paresthesia of arm 12/01/2015  . Depression 12/01/2015  . Malnutrition of moderate degree 11/20/2015  . TIA (transient ischemic attack) 11/19/2015  . Weakness 11/17/2015  . Physical deconditioning 11/10/2015  . Multiple rib fractures 11/10/2015  . Chronic systolic heart failure ([place]) 11/02/2015  . Atrial flutter with rapid ventricular response ([place]) 10/30/2015  . Atrial flutter ([place]) 10/30/2015  . B12 deficiency 07/22/2015  . Anemia 07/15/2015  . Hematuria 07/08/2015  . Unintentional weight loss 07/08/2015  . Hypotension 07/08/2015  . 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 Cough 05/22/2015  . Tobacco user 05/22/2015  . Mediastinal lymphadenopathy 01/22/2015  . 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 S/P bronchoscopy with biopsy   . Colon cancer screening 11/06/2014  . Encounter for Medicare annual wellness exam 11/06/2014  . Routine general medical examination at a health care facility 11/06/2014  . Lung nodule 06/28/2014  . Carotid stenosis 12/20/2013  . Aftercare following surgery of the circulatory system 12/20/2013  . Aftercare following surgery of the circulatory system, [place] 12/19/2012  .  Personal history of colonic polyps 08/28/2012  . Hyperglycemia 08/07/2012  . Pre-operative cardiovascular examination 11/03/2011  . Carotid artery disease ([place]) 05/20/2011  . Grief reaction 05/07/2011  . Ischemic cardiomyopathy 01/13/2011  . Palpitations 11/10/2010  . Occlusion and stenosis of carotid artery without mention of cerebral infarction 04/02/2010  . Anxiety disorder 12/12/2009  . HYPERTENSION, BENIGN 09/29/2008  . History of prostate cancer 09/19/2008  . Hyperlipidemia 02/10/2007  . AMAUROSIS FUGAX 02/10/2007  . MYOCARDIAL INFARCTION, HX OF 02/10/2007   . Coronary atherosclerosis 02/10/2007  . PERIPHERAL VASCULAR DISEASE 02/10/2007  . HEMORRHOIDS 02/10/2007  . Chronic obstructive pulmonary disease ([place]) 02/10/2007  . OSTEOARTHRITIS 02/10/2007  . Nicotine dependence 02/10/2007  . Pure hypercholesterolemia 01/27/2007  . CORONARY ATHEROSCLEROSIS NATIVE CORONARY ARTERY 01/27/2007     Past Surgical History:  Procedure Laterality Date  . CARDIAC CATHETERIZATION    . CARDIOVASCULAR STRESS TEST  12-2006  . CARDIOVERSION N/A 10/31/2015   Procedure: CARDIOVERSION;  Surgeon: Sueanne Margarita, MD;  Location: [place];  Service: Cardiovascular;  Laterality: N/A;  . CAROTID ENDARTERECTOMY Right 03-2000   CE  . CAROTID ENDARTERECTOMY Left 11-24-11   CE  . COLONOSCOPY  04-2001   polyp  . [place], 2002, 2009, 2012   8 stents total  . [place]   neg  . ENDARTERECTOMY  11/24/2011   Procedure: ENDARTERECTOMY CAROTID;  Surgeon: Rosetta Posner, MD;  Location: [place];  Service: Vascular;  Laterality: Left;  . EYE SURGERY Bilateral Aug. 17, 2015   Cataract  . FUDUCIAL PLACEMENT N/A 12/25/2014   Procedure: PLACEMENT OF FUDUCIAL;  Surgeon: Collene Gobble, MD;  Location: [place];  Service: Thoracic;  Laterality: N/A;  . PROSTATE SURGERY     Radiation Tx  X's 40  . TEE WITHOUT CARDIOVERSION N/A 10/31/2015   Procedure: TRANSESOPHAGEAL ECHOCARDIOGRAM (TEE);  Surgeon: Sueanne Margarita, MD;  [facility] ENDOSCOPY;  Service: Cardiovascular;  Laterality: N/A;  . VASCULAR SURGERY    . VIDEO BRONCHOSCOPY WITH ENDOBRONCHIAL NAVIGATION N/A 12/25/2014   Procedure: VIDEO BRONCHOSCOPY WITH ENDOBRONCHIAL NAVIGATION  with Biopsies and Brushings;  Surgeon: Collene Gobble, MD;  Location: [place];  Service: Thoracic;  Laterality: N/A;  . VIDEO BRONCHOSCOPY WITH ENDOBRONCHIAL ULTRASOUND N/A 01/22/2015   Procedure: VIDEO BRONCHOSCOPY WITH ENDOBRONCHIAL ULTRASOUND with Biopsies;  Surgeon: Collene Gobble, MD;  Location: [place];  Service:  Thoracic;  Laterality: N/A;     Prior to Admission medications   Medication Sig Start Date End Date Taking? Authorizing Provider  acetaminophen (TYLENOL) 500 MG tablet Take 1,000 mg by mouth every 4 (four) hours as needed for mild pain, moderate pain, fever or headache.  10/17/15   Historical Provider, MD  albuterol (PROVENTIL HFA;VENTOLIN HFA) 108 (90 Base) MCG/ACT inhaler Inhale 2 puffs into the lungs every 4 (four) hours as needed for wheezing or shortness of breath.    Historical Provider, MD  ALPRAZolam Duanne Moron) 0.5 MG tablet Take 0.5 mg by mouth at bedtime as needed for anxiety or sleep.    Historical Provider, MD  aspirin EC 81 MG tablet Take 81 mg by mouth at bedtime.     Historical Provider, MD  clopidogrel (PLAVIX) 75 MG tablet Take 1 tablet (75 mg total) by mouth daily. 11/26/15   Burnell Blanks, MD  ezetimibe (ZETIA) 10 MG tablet TAKE 1 TABLET (10 MG TOTAL) BY MOUTH DAILY. 12/30/15   Isaiah Serge, NP  Fluticasone-Salmeterol (ADVAIR DISKUS) 500-50 MCG/DOSE AEPB Inhale 1 puff  into the lungs 2 (two) times daily. 06/28/14 04/27/16  Tammy S Parrett, NP  Fluticasone-Salmeterol (ADVAIR DISKUS) 500-50 MCG/DOSE AEPB Inhale 1 puff into the lungs 2 (two) times daily. 01/08/16   Collene Gobble, MD  ipratropium-albuterol (DUONEB) 0.5-2.5 (3) MG/3ML SOLN Take 3 mLs by nebulization every 4 (four) hours as needed (shortness of breath and wheezing).    Historical Provider, MD  loratadine (CLARITIN) 10 MG tablet Take 10 mg by mouth daily.    Historical Provider, MD  loratadine (CLARITIN) 10 MG tablet TAKE 1 TABLET BY MOUTH EVERY DAY 01/19/16   Collene Gobble, MD  mirtazapine (REMERON) 15 MG tablet Take 1 tablet (15 mg total) by mouth at bedtime. 12/01/15   Abner Greenspan, MD  Multiple Vitamin (MULTIVITAMIN WITH MINERALS) TABS tablet Take 1 tablet by mouth daily.    Historical Provider, MD  nitroGLYCERIN (NITROSTAT) 0.4 MG SL tablet Place 0.4 mg under the tongue every 5 (five) minutes as needed for chest  pain.    Historical Provider, MD  Tamsulosin HCl (FLOMAX) 0.4 MG CAPS Take 0.4 mg by mouth at bedtime.     Historical Provider, MD  tiotropium (SPIRIVA) 18 MCG inhalation capsule Place 18 mcg into inhaler and inhale daily.    Historical Provider, MD     Allergies Augmentin [amoxicillin-pot clavulanate]; Chantix [varenicline]; Sulfonamide derivatives; and Zantac [ranitidine hcl]   Family History  Problem Relation Age of Onset  . Stroke Father   . Heart disease Father   . Heart disease Mother     pacemaker  . Other Brother     benign brain tumor  . Cancer Brother     Eye-behind  . Cancer Sister     Breast  . Liver cancer Brother   . Diabetes Maternal Grandmother     Social History Social History  Substance Use Topics  . Smoking status: Current Every Day Smoker    Packs/day: 1.00    Years: 55.00    Types: Cigarettes  . Smokeless tobacco: Never Used     Comment: 1/2 ppd (01/07/15)  . Alcohol use 0.0 oz/week     Comment: occ    Review of Systems  Constitutional:   No fever or chills.   Cardiovascular:   No chest pain. Respiratory:   No dyspnea or cough. Gastrointestinal:   Negative for abdominal pain, vomiting and diarrhea.   Musculoskeletal:   Shoulder pain after falling Neurological:   Negative for headaches 10-point ROS otherwise negative.  ____________________________________________   PHYSICAL EXAM:  VITAL SIGNS: ED Triage Vitals  Enc Vitals Group     BP 01/23/16 1006 137/74     Pulse Rate 01/23/16 1006 87     Resp 01/23/16 1030 (!) 22     Temp 01/23/16 1006 98.1 F (36.7 C)     Temp Source 01/23/16 1006 Oral     SpO2 01/23/16 1006 99 %     Weight --      Height --      Head Circumference --      Peak Flow --      Pain Score 01/23/16 1007 3     Pain Loc --      Pain Edu? --      Excl. in [place]? --     Vital signs reviewed, nursing assessments reviewed.   Constitutional:   Alert and oriented. Well appearing and in no distress. Eyes:   No  scleral icterus. No conjunctival pallor. PERRL. EOMI.  No nystagmus. ENT  Head:   Normocephalic and atraumatic.   Nose:   No congestion/rhinnorhea. No septal hematoma   Mouth/Throat:   Dry mucous membranes, no pharyngeal erythema. No peritonsillar mass.    Neck:   No stridor. No SubQ emphysema. No meningismus. Hematological/Lymphatic/Immunilogical:   No cervical lymphadenopathy. Cardiovascular:   RRR. Symmetric bilateral radial and DP pulses.  No murmurs.  Respiratory:   Normal respiratory effort without tachypnea nor retractions. Breath sounds are clear and equal bilaterally. No wheezes/rales/rhonchi. Gastrointestinal:   Soft and nontender. Non distended. There is no CVA tenderness.  No rebound, rigidity, or guarding. Rectal exam performed with nurse at the bedside. Brown stool, Hemoccult negative, QC controls okay. Genitourinary:   deferred Musculoskeletal:   Nontender with normal range of motion in all extremities. No joint effusions.  No lower extremity tenderness.  No edema. Neurologic:   Normal speech and language.  CN 2-10 normal. Motor grossly intact. No gross focal neurologic deficits are appreciated.  Skin:    Skin is warm, dry and intact. No rash noted.  No petechiae, purpura, or bullae.  ____________________________________________    LABS (pertinent positives/negatives) (all labs ordered are listed, but only abnormal results are displayed) Labs Reviewed  COMPREHENSIVE METABOLIC PANEL - Abnormal; Notable for the following:       Result Value   Glucose, Bld 112 (*)    ALT 10 (*)    All other components within normal limits  CBC WITH DIFFERENTIAL/PLATELET - Abnormal; Notable for the following:    RBC 3.75 (*)    Hemoglobin 12.5 (*)    HCT 35.5 (*)    Neutro Abs 9.1 (*)    Lymphs Abs 0.6 (*)    All other components within normal limits  URINALYSIS COMPLETEWITH MICROSCOPIC ([facility] ONLY) - Abnormal; Notable for the following:    Color, Urine YELLOW (*)     APPearance HAZY (*)    Ketones, ur TRACE (*)    Hgb urine dipstick 1+ (*)    Protein, ur 30 (*)    All other components within normal limits  LIPASE, BLOOD  TROPONIN I   ____________________________________________   EKG  Interpreted by me Sinus rhythm rate of 84, normal axis and intervals. Poor R-wave progression and interpreted as normal ST segments and T waves.  ____________________________________________    RADIOLOGY  Chest x-ray unremarkable Shoulder x-ray on the left unremarkable  ____________________________________________   PROCEDURES Procedures  ____________________________________________   INITIAL IMPRESSION / ASSESSMENT AND PLAN / ED COURSE  Pertinent labs & imaging results that were available during my care of the patient were reviewed by me and considered in my medical decision making (see chart for details).  Patient presents with orthostatic hypotension, likely due to dehydration. No evidence of GI bleed. We'll give liter IV fluids. Has questionable history of CHF so we'll be careful with fluids. Reassessed.     Clinical Course  Comment By Time  After 1 L IV fluid, patient still has orthostatic hypotension. We'll give another liter and reassessed. Carrie Mew, MD 11/03 1316    ----------------------------------------- 2:47 PM on 01/23/2016 -----------------------------------------  After 2 L of saline total infusion, there is still a significant change in his blood pressure from lying to standing, but he is no longer hypotensive or symptomatic. He may have a degree of vascular insufficiency related to his age. He is otherwise well-appearing, is been encouraged to eat and drink. I'll prescribe him Reglan and famotidine as a trial to see if this assists with his appetite and tolerating oral  by mouth. He is tolerating oral intake here in the ED although not vigorously. Mom follow up closely with primary care and referred to gastroenterology due  to his symptoms of early satiety causing weight loss and dehydration. Low suspicion for perforation obstruction peritonitis biliary disease ACS dissection PE meningitis encephalitis stroke or worsening of his known AAA. ____________________________________________   FINAL CLINICAL IMPRESSION(S) / ED DIAGNOSES  Final diagnoses:  Orthostatic hypotension  Dehydration       Portions of this note were generated with dragon dictation software. Dictation errors may occur despite best attempts at proofreading.    Carrie Mew, MD 01/23/16 1450

## 2016-01-23 NOTE — ED Triage Notes (Signed)
Ems from home for syncope x 4 this am. Per ems pt was + orthostatic at scene. Iv, 500cc NS bolus given. Per ems pt with weight loss over past several months, new onset a-fib.

## 2016-01-23 NOTE — ED Provider Notes (Signed)
Patient will be admitted. Trial orthostatics, patient became lightheaded and blood pressure dropped to 70s. He's had a total of about 3-4 single episodes today. Patient agreeable with the plan for admission and observation, rehydration and ongoing evaluation for multiple syncope. Patient's family agreeable. He is awake and alert, normal hemodynamics with laying flat but with standing developed significant hypotension   Delman Kitten, MD 01/23/16 1721

## 2016-01-23 NOTE — H&P (Signed)
[facility] [place] NAME: [name]    MR#:  [id number]  DATE OF BIRTH:  Jul 24, 1940  DATE OF ADMISSION:  01/23/2016  PRIMARY CARE PHYSICIAN: Loura Pardon, MD   REQUESTING/REFERRING PHYSICIAN: Delman Kitten  CHIEF COMPLAINT: Syncope    Chief Complaint  Patient presents with  . Loss of Consciousness    HISTORY OF PRESENT ILLNESS:  [name]  is a 75 y.o. male with a known history of Lung cancer, status post radiation therapy, in remission, coronary artery disease, multiple stents comes in because of dizziness, syncopal episodes. Patient passed out 3 times since this morning. Received aggressive hydration emergency room, patient felt lightheaded, bp dropped to 70s when he stood up. Because of orthostatic hypotension we are asked to admit the patient. Patient denies chest pain. No shortness of breath. Has chronic cough  because of COPD. Patient to has no chest pain. Patient has not eating well for the past few months because he has no appetite, upon questioning him he feels like he has dysphagia. But he does not want to see gastroenterologist while in the hospital because of insurance problems. And patient has GI appointment as an outpatient with stress and neurologist at [facility].  PAST MEDICAL HISTORY:   Past Medical History:  Diagnosis Date  . AAA (abdominal aortic aneurysm) (HCC)    4.2 cm IR AAA noted on 01/13/15 PET scan  . Anxiety   . CAD (coronary artery disease)    AMI 1997 w/ BMS x 2 LAD, BMS CFX 2002, DES x 2 LAD 2009, DES CFX & OM 2012   . Carotid artery disease ([place])    RICA CEA 5631, LICA CEA 4970 per Dr. [name],   . CHF (congestive heart failure) ([place])    pt denied in 2014  . COPD (chronic obstructive pulmonary disease) ([place])   . GERD (gastroesophageal reflux disease)    occ  . HTN (hypertension)   . Hydradenitis   . Hyperlipidemia   . Ischemic cardiomyopathy    EF 40-45 percent at cath 2012  . Myocardial  infarction   . Non-small cell lung cancer (NSCLC) ([place]) dx'd 12/2014   SBRT  . Osteoarthritis    pt. denies 01/17/2015  . PAC (premature atrial contraction)   . Peripheral vascular disease ([place])   . Pneumonia   . Prostate cancer ([place]) dx'd approx 2008   xrt throughfoley  . Radiation 02/18/15-02/25/15   right upper lung 54 Gy  . Shortness of breath dyspnea   . Stroke [facility]) 11-24-11   "no feeling in right 5th digit"  . Urinary frequency   . Urinary urgency     PAST SURGICAL HISTOIRY:   Past Surgical History:  Procedure Laterality Date  . CARDIAC CATHETERIZATION    . CARDIOVASCULAR STRESS TEST  12-2006  . CARDIOVERSION N/A 10/31/2015   Procedure: CARDIOVERSION;  Surgeon: Sueanne Margarita, MD;  Location: [facility];  Service: Cardiovascular;  Laterality: N/A;  . CAROTID ENDARTERECTOMY Right 03-2000   CE  . CAROTID ENDARTERECTOMY Left 11-24-11   CE  . COLONOSCOPY  04-2001   polyp  . [place], 2002, 2009, 2012   8 stents total  . [place]   neg  . ENDARTERECTOMY  11/24/2011   Procedure: ENDARTERECTOMY CAROTID;  Surgeon: Rosetta Posner, MD;  Location: [facility];  Service: Vascular;  Laterality: Left;  . EYE SURGERY Bilateral Aug. 17, 2015   Cataract  . FUDUCIAL  PLACEMENT N/A 12/25/2014   Procedure: PLACEMENT OF FUDUCIAL;  Surgeon: Collene Gobble, MD;  Location: [facility];  Service: Thoracic;  Laterality: N/A;  . PROSTATE SURGERY     Radiation Tx  X's 40  . TEE WITHOUT CARDIOVERSION N/A 10/31/2015   Procedure: TRANSESOPHAGEAL ECHOCARDIOGRAM (TEE);  Surgeon: Sueanne Margarita, MD;  Location[facility] ENDOSCOPY;  Service: Cardiovascular;  Laterality: N/A;  . VASCULAR SURGERY    . VIDEO BRONCHOSCOPY WITH ENDOBRONCHIAL NAVIGATION N/A 12/25/2014   Procedure: VIDEO BRONCHOSCOPY WITH ENDOBRONCHIAL NAVIGATION  with Biopsies and Brushings;  Surgeon: Collene Gobble, MD;  Location: [facility];  Service: Thoracic;  Laterality: N/A;  . VIDEO BRONCHOSCOPY WITH ENDOBRONCHIAL  ULTRASOUND N/A 01/22/2015   Procedure: VIDEO BRONCHOSCOPY WITH ENDOBRONCHIAL ULTRASOUND with Biopsies;  Surgeon: Collene Gobble, MD;  Location: [facility];  Service: Thoracic;  Laterality: N/A;    SOCIAL HISTORY:   Social History  Substance Use Topics  . Smoking status: Current Every Day Smoker    Packs/day: 1.00    Years: 55.00    Types: Cigarettes  . Smokeless tobacco: Never Used     Comment: 1/2 ppd (01/07/15)  . Alcohol use 0.0 oz/week     Comment: occ    FAMILY HISTORY:   Family History  Problem Relation Age of Onset  . Stroke Father   . Heart disease Father   . Heart disease Mother     pacemaker  . Other Brother     benign brain tumor  . Cancer Brother     Eye-behind  . Cancer Sister     Breast  . Liver cancer Brother   . Diabetes Maternal Grandmother     DRUG ALLERGIES:   Allergies  Allergen Reactions  . Augmentin [Amoxicillin-Pot Clavulanate] Diarrhea, Nausea And Vomiting and Other (See Comments)    Has patient had a PCN reaction causing immediate rash, facial/tongue/throat swelling, SOB or lightheadedness with hypotension: No Has patient had a PCN reaction causing severe rash involving mucus membranes or skin necrosis: No Has patient had a PCN reaction that required hospitalization No Has patient had a PCN reaction occurring within the last 10 years: Yes If all of the above answers are "NO", then may proceed with Cephalosporin use.   . Chantix [Varenicline]     Violent nightmares  . Sulfonamide Derivatives Other (See Comments)    Pt do not remember 8-30 md  . Zantac [Ranitidine Hcl] Diarrhea    REVIEW OF SYSTEMS:  CONSTITUTIONAL: Dizziness. EYES: No blurred or double vision.  EARS, NOSE, AND THROAT: No tinnitus or ear pain.  does have dysphagia, feels like bubble in the throat even swallowing liquids. RESPIRATORY: has  chronic cough. shortness of breath, wheezing or hemoptysis.  CARDIOVASCULAR: No chest pain, orthopnea, edema.  GASTROINTESTINAL: No  nausea, vomiting, diarrhea or abdominal pain.  GENITOURINARY: No dysuria, hematuria.  ENDOCRINE: No polyuria, nocturia,  HEMATOLOGY: No anemia, easy bruising or bleeding SKIN: No rash or lesion. MUSCULOSKELETAL: No joint pain or arthritis.   NEUROLOGIC: No tingling, numbness, weakness.  PSYCHIATRY: No anxiety or depression.   MEDICATIONS AT HOME:   Prior to Admission medications   Medication Sig Start Date End Date Taking? Authorizing Provider  acetaminophen (TYLENOL) 500 MG tablet Take 1,000 mg by mouth every 4 (four) hours as needed for mild pain, moderate pain, fever or headache.  10/17/15  Yes Historical Provider, MD  albuterol (PROVENTIL HFA;VENTOLIN HFA) 108 (90 Base) MCG/ACT inhaler Inhale 2 puffs into the lungs every 4 (four) hours as needed  for wheezing or shortness of breath.   Yes Historical Provider, MD  aspirin EC 81 MG tablet Take 81 mg by mouth at bedtime.    Yes Historical Provider, MD  clopidogrel (PLAVIX) 75 MG tablet Take 1 tablet (75 mg total) by mouth daily. 11/26/15  Yes Burnell Blanks, MD  ezetimibe (ZETIA) 10 MG tablet TAKE 1 TABLET (10 MG TOTAL) BY MOUTH DAILY. 12/30/15  Yes Isaiah Serge, NP  Fluticasone-Salmeterol (ADVAIR DISKUS) 500-50 MCG/DOSE AEPB Inhale 1 puff into the lungs 2 (two) times daily. 01/08/16  Yes Collene Gobble, MD  ipratropium-albuterol (DUONEB) 0.5-2.5 (3) MG/3ML SOLN Take 3 mLs by nebulization every 4 (four) hours as needed (shortness of breath and wheezing).   Yes Historical Provider, MD  loratadine (CLARITIN) 10 MG tablet TAKE 1 TABLET BY MOUTH EVERY DAY 01/19/16  Yes Collene Gobble, MD  mirtazapine (REMERON) 15 MG tablet Take 1 tablet (15 mg total) by mouth at bedtime. 12/01/15  Yes Abner Greenspan, MD  Multiple Vitamin (MULTIVITAMIN WITH MINERALS) TABS tablet Take 1 tablet by mouth daily.   Yes Historical Provider, MD  nitroGLYCERIN (NITROSTAT) 0.4 MG SL tablet Place 0.4 mg under the tongue every 5 (five) minutes as needed for chest pain.    Yes Historical Provider, MD  Tamsulosin HCl (FLOMAX) 0.4 MG CAPS Take 0.4 mg by mouth at bedtime.    Yes Historical Provider, MD  tiotropium (SPIRIVA) 18 MCG inhalation capsule Place 18 mcg into inhaler and inhale daily.   Yes Historical Provider, MD  ALPRAZolam Duanne Moron) 0.5 MG tablet Take 0.5 mg by mouth at bedtime as needed for anxiety or sleep.    Historical Provider, MD  famotidine (PEPCID) 20 MG tablet Take 1 tablet (20 mg total) by mouth 2 (two) times daily. 01/23/16   Carrie Mew, MD  Fluticasone-Salmeterol (ADVAIR DISKUS) 500-50 MCG/DOSE AEPB Inhale 1 puff into the lungs 2 (two) times daily. 06/28/14 04/27/16  Tammy S Parrett, NP  metoCLOPramide (REGLAN) 5 MG tablet Take 1 tablet (5 mg total) by mouth 4 (four) times daily -  before meals and at bedtime. 01/23/16 01/22/17  Carrie Mew, MD      VITAL SIGNS:  Blood pressure (!) 75/43, pulse 91, temperature 98.1 F (36.7 C), temperature source Oral, resp. rate 19, SpO2 99 %.  PHYSICAL EXAMINATION:  GENERAL:  75 y.o.-year-old patient lying in the bed with no acute distress.  EYES: Pupils equal, round, reactive to light and accommodation. No scleral icterus. Extraocular muscles intact.  HEENT: Head atraumatic, normocephalic. Oropharynx and nasopharynx clear.  NECK:  Supple, no jugular venous distention. No thyroid enlargement, no tenderness.  LUNGS: Normal breath sounds bilaterally, no wheezing, rales,rhonchi or crepitation. No use of accessory muscles of respiration.  CARDIOVASCULAR: S1, S2 normal. No murmurs, rubs, or gallops.  ABDOMEN: Soft, nontender, nondistended. Bowel sounds present. No organomegaly or mass.  EXTREMITIES: No pedal edema, cyanosis, or clubbing.  NEUROLOGIC: Cranial nerves II through XII are intact. Muscle strength 5/5 in all extremities. Sensation intact. Gait not checked.  PSYCHIATRIC: The patient is alert and oriented x 3.  SKIN: No obvious rash, lesion, or ulcer.   LABORATORY PANEL:   CBC  Recent Labs Lab  01/23/16 1034  WBC 10.3  HGB 12.5*  HCT 35.5*  PLT 310   ------------------------------------------------------------------------------------------------------------------  Chemistries   Recent Labs Lab 01/23/16 1034  NA 137  K 3.7  CL 102  CO2 26  GLUCOSE 112*  BUN 12  CREATININE 0.89  CALCIUM 9.1  AST  19  ALT 10*  ALKPHOS 66  BILITOT 0.7   ------------------------------------------------------------------------------------------------------------------  Cardiac Enzymes  Recent Labs Lab 01/23/16 1034  TROPONINI <0.03   ------------------------------------------------------------------------------------------------------------------  RADIOLOGY:  Dg Chest 2 View  Result Date: 01/23/2016 CLINICAL DATA:  Falls this morning, left shoulder pain, initial encounter. EXAM: CHEST  2 VIEW COMPARISON:  12/01/2015 and CT chest 10/27/2015. FINDINGS: Trachea is midline. Heart size normal. Lungs are hyperinflated. Subpleural nodular lesion with post treatment changes in the peripheral right upper lobe, grossly stable. Scattered pulmonary nodules seen on 10/27/2015 are better seen on that study. Tiny right pleural effusion, as before. No left pleural fluid. Old right rib fractures. Osseous structures otherwise appear grossly intact. IMPRESSION: 1. Subpleural right upper lobe nodule with associated posttreatment changes, grossly stable. Scattered bilateral pulmonary nodules, better seen on 10/27/2015. 2. Tiny right pleural effusion, stable. 3. Hyperinflation. 4. Osseous structures appear grossly intact. Electronically Signed   By: Lorin Picket M.D.   On: 01/23/2016 10:33   Dg Shoulder Left  Result Date: 01/23/2016 CLINICAL DATA:  Fall this morning.  Left shoulder pain. EXAM: LEFT SHOULDER - 2+ VIEW COMPARISON:  07/17/2012 FINDINGS: Degenerative changes in the left [facility] joint with joint space narrowing and spurring. Glenohumeral joint is maintained. There is lucency within the glenoid  which appears new since prior study. Cannot exclude scapular fracture. No proximal humeral abnormality. IMPRESSION: Lucency within the glenoid, cannot exclude fracture. May consider further evaluation with CT. Degenerative changes in the left AC joint. Electronically Signed   By: Rolm Baptise M.D.   On: 01/23/2016 10:28    EKG:   Orders placed or performed during the hospital encounter of 01/23/16  . ED EKG  . ED EKG  ekg shows normal sinus rhythm at 84 bpm.  IMPRESSION AND PLAN:   #1 recurrent syncope secondary to orthostatic hypotension: Admitted to telemetry, check echocardiogram due to h/o CAD. monitor ortho staticvitals, continue aggressive hydration, likely discharge tomorrow. #2 syncopal episode, history hypertension likely secondary to not eating, not drinking a long time; had history of radiation therapy to the chest unable patient may have ice of the stricture but they don't want to see gastroenterologist in the hospital, patient has appointment with patient's gastroenterologist [facility], daughter wants him to go there after  discharge. #3. Continue Flomax #4 history of coronary artery disease, multiple stents: Patient is on aspirin, Plavix. #5 history of depression: Patient is on Remeron #6 discharge tomorrow.  All the records are reviewed and case discussed with ED provider. Management plans discussed with the patient, family and they are in agreement.  CODE STATUS: full  TOTAL TIME TAKING CARE OF THIS PATIENT: 62mnutes.    KEpifanio LeschesM.D on 01/23/2016 at 5:52 PM  Between 7am to 6pm - Pager - [phone]  After 6pm go to [email or web address] - password [email or web address]  Office  [phone] CC: Primary care physician; MLoura Pardon MD  Note: This dictation was prepared with Dragon dictation along with smaller phrase technology. Any transcriptional errors that result from this process are unintentional.

## 2016-01-24 ENCOUNTER — Observation Stay (HOSPITAL_BASED_OUTPATIENT_CLINIC_OR_DEPARTMENT_OTHER)
Admit: 2016-01-24 | Discharge: 2016-01-24 | Disposition: A | Discharge status: Home / Self Care | Payer: Medicare Other | Attending: Internal Medicine | Admitting: Internal Medicine

## 2016-01-24 ENCOUNTER — Observation Stay: Payer: Medicare Other

## 2016-01-24 DIAGNOSIS — I4891 Unspecified atrial fibrillation: Secondary | ICD-10-CM | POA: Diagnosis not present

## 2016-01-24 DIAGNOSIS — I2510 Atherosclerotic heart disease of native coronary artery without angina pectoris: Secondary | ICD-10-CM | POA: Diagnosis not present

## 2016-01-24 DIAGNOSIS — I10 Essential (primary) hypertension: Secondary | ICD-10-CM | POA: Diagnosis not present

## 2016-01-24 DIAGNOSIS — R55 Syncope and collapse: Secondary | ICD-10-CM | POA: Diagnosis not present

## 2016-01-24 DIAGNOSIS — I951 Orthostatic hypotension: Secondary | ICD-10-CM | POA: Diagnosis not present

## 2016-01-24 LAB — CBC
HCT: 27.4 % — ABNORMAL LOW (ref 40.0–52.0)
HEMOGLOBIN: 9.3 g/dL — AB (ref 13.0–18.0)
MCH: 32.5 pg (ref 26.0–34.0)
MCHC: 33.9 g/dL (ref 32.0–36.0)
MCV: 95.8 fL (ref 80.0–100.0)
Platelets: 251 10*3/uL (ref 150–440)
RBC: 2.86 MIL/uL — ABNORMAL LOW (ref 4.40–5.90)
RDW: 14.6 % — AB (ref 11.5–14.5)
WBC: 6.7 10*3/uL (ref 3.8–10.6)

## 2016-01-24 LAB — BASIC METABOLIC PANEL
ANION GAP: 3 — AB (ref 5–15)
BUN: 29 mg/dL — AB (ref 6–20)
CALCIUM: 7.8 mg/dL — AB (ref 8.9–10.3)
CO2: 27 mmol/L (ref 22–32)
Chloride: 107 mmol/L (ref 101–111)
Creatinine, Ser: 0.71 mg/dL (ref 0.61–1.24)
GFR calc Af Amer: >60 mL/min (ref >60)
GLUCOSE: 99 mg/dL (ref 65–99)
Potassium: 3.9 mmol/L (ref 3.5–5.1)
Sodium: 137 mmol/L (ref 135–145)

## 2016-01-24 LAB — GLUCOSE, CAPILLARY: Glucose-Capillary: 98 mg/dL (ref 65–99)

## 2016-01-24 LAB — ECHOCARDIOGRAM COMPLETE
HEIGHTINCHES: 70 in
WEIGHTICAEL: 2232 [oz_av]

## 2016-01-24 MED ORDER — NICOTINE 14 MG/24HR TD PT24
14.0000 mg | MEDICATED_PATCH | Freq: Every day | TRANSDERMAL | Status: DC
  Administered 2016-01-24 – 2016-01-25 (×2): 14 mg via TRANSDERMAL
  Filled 2016-01-24 (×2): qty 1

## 2016-01-24 MED ORDER — FLUDROCORTISONE ACETATE 0.1 MG PO TABS
0.1000 mg | ORAL_TABLET | Freq: Every day | ORAL | Status: DC
  Administered 2016-01-24 – 2016-01-25 (×2): 0.1 mg via ORAL
  Filled 2016-01-24 (×3): qty 1

## 2016-01-24 MED ORDER — SODIUM CHLORIDE 0.9 % IV BOLUS (SEPSIS)
1000.0000 mL | Freq: Once | INTRAVENOUS | Status: AC
  Administered 2016-01-24: 1000 mL via INTRAVENOUS

## 2016-01-24 NOTE — Discharge Instructions (Addendum)
Orthostatic Hypotension Orthostatic hypotension is a sudden drop in blood pressure. It happens when you quickly stand up from a seated or lying position. You may feel dizzy or light-headed. This can last for just a few seconds or for up to a few minutes. It is usually not a serious problem. However, if this happens frequently or gets worse, it can be a sign of something more serious. CAUSES  Different things can cause orthostatic hypotension, including:   Loss of body fluids (dehydration).  Medicines that lower blood pressure.  Sudden changes in posture, such as standing up quickly after you have been sitting or lying down.  Taking too much of your medicine. SIGNS AND SYMPTOMS   Light-headedness or dizziness.   Fainting or near-fainting.   A fast heart rate.   Weakness.   Feeling tired (fatigue).  DIAGNOSIS  Your health care provider may do several things to help diagnose your condition and identify the cause. These may include:   Taking a medical history and doing a physical exam.  Checking your blood pressure. Your health care provider will check your blood pressure when you are:  Lying down.  Sitting.  Standing.  Using tilt table testing. In this test, you lie down on a table that moves from a lying position to a standing position. You will be strapped onto the table. This test monitors your blood pressure and heart rate when you are in different positions. TREATMENT  Treatment will vary depending on the cause. Possible treatments include:   Changing the dosage of your medicines.  Wearing compression stockings on your lower legs.  Standing up slowly after sitting or lying down.  Eating more salt.  Eating frequent, small meals.  In some cases, getting IV fluids.  Taking medicine to enhance fluid retention. HOME CARE INSTRUCTIONS  Only take over-the-counter or prescription medicines as directed by your health care provider.  Follow your health care  provider's instructions for changing the dosage of your current medicines.  Do not stop or adjust your medicine on your own.  Stand up slowly after sitting or lying down. This allows your body to adjust to the different position.  Wear compression stockings as directed.  Eat extra salt as directed.  Do not add extra salt to your diet unless directed to by your health care provider.  Eat frequent, small meals.  Avoid standing suddenly after eating.  Avoid hot showers or excessive heat as directed by your health care provider.  Keep all follow-up appointments. SEEK MEDICAL CARE IF:  You continue to feel dizzy or light-headed after standing.  You feel groggy or confused.  You feel cold, clammy, or sick to your stomach (nauseous).  You have blurred vision.  You feel short of breath. SEEK IMMEDIATE MEDICAL CARE IF:   You faint after standing.  You have chest pain.  You have difficulty breathing.   You lose feeling or movement in your arms or legs.   You have slurred speech or difficulty talking, or you are unable to talk.  MAKE SURE YOU:   Understand these instructions.  Will watch your condition.  Will get help right away if you are not doing well or get worse.   This information is not intended to replace advice given to you by your health care provider. Make sure you discuss any questions you have with your health care provider.   Document Released: 02/26/2002 Document Revised: 03/13/2013 Document Reviewed: 12/29/2012 Elsevier Interactive Patient Education 2016 [employer]. Near-Syncope Near-syncope (commonly  known as near fainting) is sudden weakness, dizziness, or feeling like you might pass out. This can happen when getting up or while standing for a long time. It is caused by a sudden decrease in blood flow to the brain, which can occur for various reasons. Most of the reasons are not serious.  HOME CARE Watch your condition for any changes.  Have  someone stay with you until you feel stable.  If you feel like you are going to pass out:  Lie down right away.  Prop your feet up if you can.  Breathe deeply and steadily.  Move only when the feeling has gone away. Most of the time, this feeling lasts only a few minutes. You may feel tired for several hours.  Drink enough fluids to keep your pee (urine) clear or pale yellow.  If you are taking blood pressure or heart medicine, stand up slowly.  Follow up with your doctor as told. GET HELP RIGHT AWAY IF:   You have a severe headache.  You have unusual pain in the chest, belly (abdomen), or back.  You have bleeding from the mouth or butt (rectum), or you have black or tarry poop (stool).  You feel your heart beat differently than normal, or you have a very fast pulse.  You pass out, or you twitch and shake when you pass out.  You pass out when sitting or lying down.  You feel confused.  You have trouble walking.  You are weak.  You have vision problems. MAKE SURE YOU:   Understand these instructions.  Will watch your condition.  Will get help right away if you are not doing well or get worse.   This information is not intended to replace advice given to you by your health care provider. Make sure you discuss any questions you have with your health care provider.   Document Released: 08/25/2007 Document Revised: 03/29/2014 Document Reviewed: 08/11/2012 Elsevier Interactive Patient Education [employer]. Resume activity and diet as before  Please follow up with your doctor and GI doctor within 1 week  Always take time to stand up from laying or sitting position to reduce dizziness and passing out. Keep yourself well hydrated.

## 2016-01-24 NOTE — Progress Notes (Signed)
*  PRELIMINARY RESULTS* Echocardiogram 2D Echocardiogram has been performed.  [name] 01/24/2016, 9:03 AM

## 2016-01-24 NOTE — Care Management Obs Status (Signed)
[place] NOTIFICATION   Patient Details  Name: [name] MRN: [id number] Date of Birth: 1940/11/21   Medicare Observation Status Notification Given:  Yes    CrutchfieldAntony Haste, RN 01/24/2016, 7:27 PM

## 2016-01-24 NOTE — Progress Notes (Signed)
  Discussed with patient and daughter in the room regarding plan of care. Daughter frustrated with the Lasix in treatment along with miscommunication from emergency room to the floor.  She feels his blood pressure has not been monitored and off which is the reason for admission. She is also questioning why an echocardiogram was ordered.  Discussed that blood pressure is checked every 4 hours. Patient's orthostatic vitals have already been checked today and are being monitored. Explained echocardiogram is being checked to rule out any significant valvular abnormalities. Telemetry showed no arrhythmias.  Explained that patient's dysphagia is contributing to his significant weight loss which is likely cause of his drop in blood pressure. I have advised that he continue his physical therapy and take time to change positions from laying to sitting and standing.  Patient will be discharged home if chest x-ray and echocardiogram are normal.

## 2016-01-25 DIAGNOSIS — I2510 Atherosclerotic heart disease of native coronary artery without angina pectoris: Secondary | ICD-10-CM | POA: Diagnosis not present

## 2016-01-25 DIAGNOSIS — R55 Syncope and collapse: Secondary | ICD-10-CM | POA: Diagnosis not present

## 2016-01-25 DIAGNOSIS — I951 Orthostatic hypotension: Secondary | ICD-10-CM | POA: Diagnosis not present

## 2016-01-25 LAB — CORTISOL: Cortisol, Plasma: 13.7 ug/dL

## 2016-01-25 LAB — GLUCOSE, CAPILLARY: Glucose-Capillary: 99 mg/dL (ref 65–99)

## 2016-01-25 MED ORDER — FLUDROCORTISONE ACETATE 0.1 MG PO TABS: 0.1000 mg | ORAL_TABLET | Freq: Every day | ORAL | 0 refills | Status: DC

## 2016-01-25 MED ORDER — FLUDROCORTISONE ACETATE 0.1 MG PO TABS: 0.1000 mg | ORAL_TABLET | Freq: Every day | ORAL | Status: DC

## 2016-01-25 NOTE — Progress Notes (Signed)
[facility] [place] NAME: [name]    MR#:  [id number]  DATE OF BIRTH:  [birth year]  SUBJECTIVE:  CHIEF COMPLAINT:   Chief Complaint  Patient presents with  . Loss of Consciousness   Feels better Dysphagia worsening over months Lost 40 lbs Wants to see GI only in [place] and not here  REVIEW OF SYSTEMS:    Review of Systems  Constitutional: Positive for malaise/fatigue and weight loss. Negative for chills and fever.  HENT: Negative for sore throat.   Eyes: Negative for blurred vision, double vision and pain.  Respiratory: Negative for cough, hemoptysis, shortness of breath and wheezing.   Cardiovascular: Negative for chest pain, palpitations, orthopnea and leg swelling.  Gastrointestinal: Negative for abdominal pain, constipation, diarrhea, heartburn, nausea and vomiting.  Genitourinary: Negative for dysuria and hematuria.  Musculoskeletal: Negative for back pain and joint pain.  Skin: Negative for rash.  Neurological: Positive for dizziness and weakness. Negative for sensory change, speech change, focal weakness and headaches.  Endo/Heme/Allergies: Does not bruise/bleed easily.  Psychiatric/Behavioral: Negative for depression. The patient is not nervous/anxious.     DRUG ALLERGIES:   Allergies  Allergen Reactions  . Augmentin [Amoxicillin-Pot Clavulanate] Diarrhea, Nausea And Vomiting and Other (See Comments)    Has patient had a PCN reaction causing immediate rash, facial/tongue/throat swelling, SOB or lightheadedness with hypotension: No Has patient had a PCN reaction causing severe rash involving mucus membranes or skin necrosis: No Has patient had a PCN reaction that required hospitalization No Has patient had a PCN reaction occurring within the last 10 years: Yes If all of the above answers are "NO", then may proceed with Cephalosporin use.   . Chantix [Varenicline]     Violent nightmares  . Sulfonamide Derivatives  Other (See Comments)    Pt do not remember 8-30 md  . Zantac [Ranitidine Hcl] Diarrhea    VITALS:  Blood pressure (!) 112/59, pulse 78, temperature 97.8 F (36.6 C), temperature source Oral, resp. rate 18, height '5\' 10"'$  (1.778 m), weight 62.7 kg (138 lb 3.2 oz), SpO2 98 %.  PHYSICAL EXAMINATION:   Physical Exam  GENERAL:  75 y.o.-year-old patient lying in the bed with no acute distress.  EYES: Pupils equal, round, reactive to light and accommodation. No scleral icterus. Extraocular muscles intact.  HEENT: Head atraumatic, normocephalic. Oropharynx and nasopharynx clear.  NECK:  Supple, no jugular venous distention. No thyroid enlargement, no tenderness.  LUNGS: Normal breath sounds bilaterally, no wheezing, rales, rhonchi. No use of accessory muscles of respiration.  CARDIOVASCULAR: S1, S2 normal. No murmurs, rubs, or gallops.  ABDOMEN: Soft, nontender, nondistended. Bowel sounds present. No organomegaly or mass.  EXTREMITIES: No cyanosis, clubbing or edema b/l.    NEUROLOGIC: Cranial nerves II through XII are intact. No focal Motor or sensory deficits b/l.   PSYCHIATRIC: The patient is alert and oriented x 3.  SKIN: No obvious rash, lesion, or ulcer.   LABORATORY PANEL:   CBC  Recent Labs Lab 01/24/16 0530  WBC 6.7  HGB 9.3*  HCT 27.4*  PLT 251   ------------------------------------------------------------------------------------------------------------------ Chemistries   Recent Labs Lab 01/23/16 1034 01/24/16 0530  NA 137 137  K 3.7 3.9  CL 102 107  CO2 26 27  GLUCOSE 112* 99  BUN 12 29*  CREATININE 0.89 0.71  CALCIUM 9.1 7.8*  AST 19  --   ALT 10*  --   ALKPHOS 66  --   BILITOT 0.7  --    ------------------------------------------------------------------------------------------------------------------  Cardiac Enzymes  Recent Labs Lab 01/23/16 1034  TROPONINI <0.03    ------------------------------------------------------------------------------------------------------------------  RADIOLOGY:  Dg Chest 2 View  Result Date: 01/24/2016 CLINICAL DATA:  Atrial fibrillation with recent syncopal event EXAM: CHEST  2 VIEW COMPARISON:  01/23/2016 FINDINGS: Cardiac shadow is within normal limits. The thoracic aorta is tortuous but stable. Postsurgical changes are noted in the right apex. Lungs are well aerated without focal infiltrate or sizable effusion. Chronic blunting is noted in the right costophrenic angle. IMPRESSION: Stable changes in the right upper lobe consistent with the given clinical history. No acute abnormality noted. Electronically Signed   By: Inez Catalina M.D.   On: 01/24/2016 14:27   Dg Chest 2 View  Result Date: 01/23/2016 CLINICAL DATA:  Falls this morning, left shoulder pain, initial encounter. EXAM: CHEST  2 VIEW COMPARISON:  12/01/2015 and CT chest 10/27/2015. FINDINGS: Trachea is midline. Heart size normal. Lungs are hyperinflated. Subpleural nodular lesion with post treatment changes in the peripheral right upper lobe, grossly stable. Scattered pulmonary nodules seen on 10/27/2015 are better seen on that study. Tiny right pleural effusion, as before. No left pleural fluid. Old right rib fractures. Osseous structures otherwise appear grossly intact. IMPRESSION: 1. Subpleural right upper lobe nodule with associated posttreatment changes, grossly stable. Scattered bilateral pulmonary nodules, better seen on 10/27/2015. 2. Tiny right pleural effusion, stable. 3. Hyperinflation. 4. Osseous structures appear grossly intact. Electronically Signed   By: Lorin Picket M.D.   On: 01/23/2016 10:33   Dg Shoulder Left  Result Date: 01/23/2016 CLINICAL DATA:  Fall this morning.  Left shoulder pain. EXAM: LEFT SHOULDER - 2+ VIEW COMPARISON:  07/17/2012 FINDINGS: Degenerative changes in the left [facility] joint with joint space narrowing and spurring. Glenohumeral  joint is maintained. There is lucency within the glenoid which appears new since prior study. Cannot exclude scapular fracture. No proximal humeral abnormality. IMPRESSION: Lucency within the glenoid, cannot exclude fracture. May consider further evaluation with CT. Degenerative changes in the left AC joint. Electronically Signed   By: Rolm Baptise M.D.   On: 01/23/2016 10:28     ASSESSMENT AND PLAN:   #1 Orthostatic syncope Bolused fluids. Significant drop in SBP 32mhg with standing Echo - Normal EF Will check cortisol in AM and start florinef.  # Continue Flomax  # history of coronary artery disease, multiple stents: Patient is on aspirin, Plavix.  # history of depression: Patient is on Remeron  All the records are reviewed and case discussed with Care Management/Social Workerr. Management plans discussed with the patient, family and they are in agreement.  CODE STATUS: FULL  DVT Prophylaxis: SCDs  TOTAL TIME TAKING CARE OF THIS PATIENT: 40 minutes.   POSSIBLE D/C IN 1-2 DAYS, DEPENDING ON CLINICAL CONDITION.  SHillary BowR M.D on 01/25/2016 at 6:59 AM  Between 7am to 6pm - Pager - [phone]  After 6pm go to [email or web address] - password [email or web address]  Office  [phone] CC: Primary care physician; MLoura Pardon MD  Note: This dictation was prepared with Dragon dictation along with smaller phrase technology. Any transcriptional errors that result from this process are unintentional.

## 2016-01-25 NOTE — Progress Notes (Signed)
Patient discharged via wheelchair and private vehicle. IV removed and catheter intact. All discharge instructions given and patient verbalizes understanding. Tele removed and returned. No prescriptions given to patient No distress noted.   

## 2016-01-25 NOTE — Progress Notes (Signed)
Patient scheduled to have a heart cath in the AM gave patient education on Coronary angiogram. Offered for patient to watch video he declined at this time.

## 2016-01-25 NOTE — Care Management Note (Signed)
Case Management Note  Patient Details  Name: [name] MRN: [id number] Date of Birth: 07-Oct-1940  Subjective/Objective:       Discussed Home Health PT with [name] per physician's order. [name] already goes to Outpatient PT but agreed to discuss his home situation with [place] when they call to make an initial appointment with him. After discussing co-pays and appointment times with [facility] he can decide on whether or not he wants to use [facility] or if he wants to continue outpatient PT. A referral was faxed to [facility] requesting home health PT.               Action/Plan:   Expected Discharge Date:  01/24/16               Expected Discharge Plan:     In-House Referral:     Discharge planning Services     Post Acute Care Choice:    Choice offered to:     DME Arranged:    DME Agency:     HH Arranged:    HH Agency:     Status of Service:     If discussed at [employer], dates discussed:    Additional Comments:  Teejay Meader A, RN 01/25/2016, 12:50 PM

## 2016-01-26 ENCOUNTER — Ambulatory Visit (INDEPENDENT_AMBULATORY_CARE_PROVIDER_SITE_OTHER): Payer: Medicare Other | Admitting: Cardiovascular Disease

## 2016-01-26 ENCOUNTER — Telehealth: Payer: Self-pay | Admitting: *Deleted

## 2016-01-26 ENCOUNTER — Encounter: Payer: Self-pay | Admitting: Cardiovascular Disease

## 2016-01-26 VITALS — BP 114/50 | HR 98 | Ht 69.0 in | Wt 143.2 lb

## 2016-01-26 DIAGNOSIS — I6523 Occlusion and stenosis of bilateral carotid arteries: Secondary | ICD-10-CM

## 2016-01-26 DIAGNOSIS — Z720 Tobacco use: Secondary | ICD-10-CM | POA: Diagnosis not present

## 2016-01-26 DIAGNOSIS — E7800 Pure hypercholesterolemia, unspecified: Secondary | ICD-10-CM | POA: Diagnosis not present

## 2016-01-26 DIAGNOSIS — I4892 Unspecified atrial flutter: Secondary | ICD-10-CM | POA: Diagnosis not present

## 2016-01-26 DIAGNOSIS — I25811 Atherosclerosis of native coronary artery of transplanted heart without angina pectoris: Secondary | ICD-10-CM

## 2016-01-26 DIAGNOSIS — I951 Orthostatic hypotension: Secondary | ICD-10-CM

## 2016-01-26 MED ORDER — ALPRAZOLAM 0.5 MG PO TABS: 0.5000 mg | ORAL_TABLET | Freq: Every evening | ORAL | 0 refills | Status: DC | PRN

## 2016-01-26 NOTE — Progress Notes (Signed)
Chief Complaint  Patient presents with  . Atrial Flutter     History of Present Illness: 75 yo male with history of CAD, COPD, HTN, HLD, GERD, PAD, carotid artery disease and atrial flutter who is here today for cardiac follow up. His cardiac history is significant for anterior MI in 1997 treated with a bare metal stent x 2 in the LAD, bare metal stent x 1 in the Circumflex in 2002 and PCI in 2009 with placement of 2 separate drug eluting stents in the LAD, DES placement in the distal AV groove Circumflex in 2012 and DES in the OM in 2012. His carotid artery dopplers 12/02/12 in [facility] office showed mild disease in RICA at CEA site and mild disease in the LICA CES site. He is followed by Dr. Lamonte Sakai for COPD. He stopped Losartan 2014 due to dizziness and hypotension. He had c/o chest pain at his visit here in March 2015. I arranged a stress test but he cancelled this test. He was seen in follow up 12/10/13 and his chest pain had resolved. 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 Ad[facility] to [facility] 11/01/15 with tachycardia and found to have atrial flutter. He was was anti-coagulated on Eliquis and Cardizem was started. He underwent TEE guided DCCV. He was admitted to [facility] 11/20/15 with a TIA while on Eliquis. He took Eliqius for two weeks but then refused to continue and restarted ASA/Plavix, knowing this was against medical advice and not optimal for CVA prevention with atrial flutter. Admitted to [facility] 01/23/16 with syncope. He was felt to be orthostatic and was hydrated. His beta blocker and Cardizem have been held. Echo 01/24/16 with LVEF=60-65%. Moderate TR, RVSP 57 mmHg.   He is here for follow up. He denies chest pain or change in breathing. Still smoking. He has no LE edema. Feeling better since discharge 01/24/16.   Primary Care Physician: Loura Pardon, MD   Past Medical History:  Diagnosis Date  . AAA (abdominal aortic aneurysm) (HCC)    4.2 cm IR AAA  noted on 01/13/15 PET scan  . Anxiety   . CAD (coronary artery disease)    AMI 1997 w/ BMS x 2 LAD, BMS CFX 2002, DES x 2 LAD 2009, DES CFX & OM 2012   . Carotid artery disease ([place])    RICA CEA 9024, LICA CEA 0973 per Dr. [name],   . COPD (chronic obstructive pulmonary disease) ([place])   . GERD (gastroesophageal reflux disease)    occ  . HTN (hypertension)   . Hydradenitis   . Hyperlipidemia   . Ischemic cardiomyopathy    EF 40-45 percent at cath 2012  . Myocardial infarction   . Non-small cell lung cancer (NSCLC) ([place]) dx'd 12/2014   SBRT  . Osteoarthritis    pt. denies 01/17/2015  . PAC (premature atrial contraction)   . Peripheral vascular disease ([place])   . Pneumonia   . Prostate cancer ([place]) dx'd approx 2008   xrt throughfoley  . Radiation 02/18/15-02/25/15   right upper lung 54 Gy  . Shortness of breath dyspnea   . Stroke [facility]) 11-24-11   "no feeling in right 5th digit"  . Urinary frequency   . Urinary urgency     Past Surgical History:  Procedure Laterality Date  . CARDIAC CATHETERIZATION    . CARDIOVASCULAR STRESS TEST  12-2006  . CARDIOVERSION N/A 10/31/2015   Procedure: CARDIOVERSION;  Surgeon: Sueanne Margarita, MD;  Location: [facility]  ENDOSCOPY;  Service: Cardiovascular;  Laterality: N/A;  . CAROTID ENDARTERECTOMY Right 03-2000   CE  . CAROTID ENDARTERECTOMY Left 11-24-11   CE  . COLONOSCOPY  04-2001   polyp  . [place], 2002, 2009, 2012   8 stents total  . [place]   neg  . ENDARTERECTOMY  11/24/2011   Procedure: ENDARTERECTOMY CAROTID;  Surgeon: Rosetta Posner, MD;  Location: [facility];  Service: Vascular;  Laterality: Left;  . EYE SURGERY Bilateral Aug. 17, 2015   Cataract  . FUDUCIAL PLACEMENT N/A 12/25/2014   Procedure: PLACEMENT OF FUDUCIAL;  Surgeon: Collene Gobble, MD;  Location: [facility];  Service: Thoracic;  Laterality: N/A;  . PROSTATE SURGERY     Radiation Tx  X's 40  . TEE WITHOUT CARDIOVERSION N/A 10/31/2015   Procedure:  TRANSESOPHAGEAL ECHOCARDIOGRAM (TEE);  Surgeon: Sueanne Margarita, MD;  Location[facility] ENDOSCOPY;  Service: Cardiovascular;  Laterality: N/A;  . VASCULAR SURGERY    . VIDEO BRONCHOSCOPY WITH ENDOBRONCHIAL NAVIGATION N/A 12/25/2014   Procedure: VIDEO BRONCHOSCOPY WITH ENDOBRONCHIAL NAVIGATION  with Biopsies and Brushings;  Surgeon: Collene Gobble, MD;  Location: [facility];  Service: Thoracic;  Laterality: N/A;  . VIDEO BRONCHOSCOPY WITH ENDOBRONCHIAL ULTRASOUND N/A 01/22/2015   Procedure: VIDEO BRONCHOSCOPY WITH ENDOBRONCHIAL ULTRASOUND with Biopsies;  Surgeon: Collene Gobble, MD;  Location: [place] [facility] OR;  Service: Thoracic;  Laterality: N/A;    Current Outpatient Prescriptions  Medication Sig Dispense Refill  . acetaminophen (TYLENOL) 500 MG tablet Take 1,000 mg by mouth every 4 (four) hours as needed for mild pain, moderate pain, fever or headache.     . albuterol (PROVENTIL HFA;VENTOLIN HFA) 108 (90 Base) MCG/ACT inhaler Inhale 2 puffs into the lungs every 4 (four) hours as needed for wheezing or shortness of breath.    . ALPRAZolam (XANAX) 0.5 MG tablet Take 1 tablet (0.5 mg total) by mouth at bedtime as needed for anxiety or sleep. 30 tablet 0  . aspirin EC 81 MG tablet Take 81 mg by mouth at bedtime.     . clopidogrel (PLAVIX) 75 MG tablet Take 1 tablet (75 mg total) by mouth daily. 30 tablet 11  . ezetimibe (ZETIA) 10 MG tablet TAKE 1 TABLET (10 MG TOTAL) BY MOUTH DAILY. 30 tablet 11  . famotidine (PEPCID) 20 MG tablet Take 1 tablet (20 mg total) by mouth 2 (two) times daily. 60 tablet 0  . fludrocortisone (FLORINEF) 0.1 MG tablet Take 1 tablet (0.1 mg total) by mouth daily. 30 tablet 0  . Fluticasone-Salmeterol (ADVAIR DISKUS) 500-50 MCG/DOSE AEPB Inhale 1 puff into the lungs 2 (two) times daily. 3 each 0  . Fluticasone-Salmeterol (ADVAIR DISKUS) 500-50 MCG/DOSE AEPB Inhale 1 puff into the lungs 2 (two) times daily. 14 each 0  . ipratropium-albuterol (DUONEB) 0.5-2.5 (3) MG/3ML SOLN Take 3 mLs by  nebulization every 4 (four) hours as needed (shortness of breath and wheezing).    Marland Kitchen loratadine (CLARITIN) 10 MG tablet TAKE 1 TABLET BY MOUTH EVERY DAY 30 tablet 5  . metoCLOPramide (REGLAN) 5 MG tablet Take 1 tablet (5 mg total) by mouth 4 (four) times daily -  before meals and at bedtime. 40 tablet 0  . mirtazapine (REMERON) 15 MG tablet Take 1 tablet (15 mg total) by mouth at bedtime. 30 tablet 5  . Multiple Vitamin (MULTIVITAMIN WITH MINERALS) TABS tablet Take 1 tablet by mouth daily.    . nitroGLYCERIN (NITROSTAT) 0.4 MG SL tablet Place 0.4 mg  under the tongue every 5 (five) minutes as needed for chest pain.    . Tamsulosin HCl (FLOMAX) 0.4 MG CAPS Take 0.4 mg by mouth at bedtime.     Marland Kitchen tiotropium (SPIRIVA) 18 MCG inhalation capsule Place 18 mcg into inhaler and inhale daily.     No current facility-administered medications for this visit.     Allergies  Allergen Reactions  . Augmentin [Amoxicillin-Pot Clavulanate] Diarrhea, Nausea And Vomiting and Other (See Comments)    Has patient had a PCN reaction causing immediate rash, facial/tongue/throat swelling, SOB or lightheadedness with hypotension: No Has patient had a PCN reaction causing severe rash involving mucus membranes or skin necrosis: No Has patient had a PCN reaction that required hospitalization No Has patient had a PCN reaction occurring within the last 10 years: Yes If all of the above answers are "NO", then may proceed with Cephalosporin use.   . Chantix [Varenicline]     Violent nightmares  . Sulfonamide Derivatives Other (See Comments)    Pt do not remember 8-30 md  . Zantac [Ranitidine Hcl] Diarrhea    Social History   Social History  . Marital status: Widowed    Spouse name: N/A  . Number of children: 5  . Years of education: N/A   Occupational History  . Retired Airline pilot   . Part time, delivering auto [employer]   Social History Main Topics  . Smoking status: Current Every Day Smoker     Packs/day: 1.00    Years: 55.00    Types: Cigarettes  . Smokeless tobacco: Never Used     Comment: 1/2 ppd (01/07/15)  . Alcohol use 0.0 oz/week     Comment: occ  . Drug use: No  . Sexual activity: Not on file   Other Topics Concern  . Not on file   Social History Narrative  . No narrative on file    Family History  Problem Relation Age of Onset  . Stroke Father   . Heart disease Father   . Heart disease Mother     pacemaker  . Other Brother     benign brain tumor  . Cancer Brother     Eye-behind  . Cancer Sister     Breast  . Liver cancer Brother   . Diabetes Maternal Grandmother     Review of Systems:  As stated in the HPI and otherwise negative.   BP (!) 114/50   Pulse 98   Ht '5\' 9"'$  (1.753 m)   Wt 143 lb 3.2 oz (65 kg)   BMI 21.15 kg/m   Physical Examination: General: Well developed, well nourished, NAD  HEENT: OP clear, mucus membranes moist  SKIN: warm, dry. No rashes. Neuro: No focal deficits  Musculoskeletal: Muscle strength 5/5 all ext  Psychiatric: Mood and affect normal  Neck: No JVD, no carotid bruits, no thyromegaly, no lymphadenopathy.  Lungs:Clear bilaterally, no wheezes, rhonci, crackles Cardiovascular: Regular rate and rhythm. No murmurs, gallops or rubs. Abdomen:Soft. Bowel sounds present. Non-tender.  Extremities: No lower extremity edema. Pulses are 2 + in the bilateral DP/PT.  Echo 01/24/16: Left ventricle: The cavity size was normal. There was mild   concentric hypertrophy. Systolic function was normal. The   estimated ejection fraction was in the range of 60% to 65%. Wall   motion was normal; there were no regional wall motion   abnormalities. The study is not technically sufficient to allow   evaluation of LV diastolic function. - Left atrium: The atrium  was normal in size. - Right ventricle: Systolic function was normal. - Tricuspid valve: There was mild-moderate regurgitation. - Pulmonary arteries: Systolic pressure was  moderately elevated. PA   peak pressure: 57 mm Hg (S).  EKG:  EKG is not  ordered today. The ekg ordered today demonstrates   Recent Labs: 10/30/2015: TSH 5.114 01/23/2016: ALT 10 01/24/2016: BUN 29; Creatinine, Ser 0.71; Hemoglobin 9.3; Platelets 251; Potassium 3.9; Sodium 137   Lipid Panel    Component Value Date/Time   CHOL 116 11/20/2015 0630   TRIG 65 11/20/2015 0630   HDL 51 11/20/2015 0630   CHOLHDL 2.3 11/20/2015 0630   VLDL 13 11/20/2015 0630   LDLCALC 52 11/20/2015 0630     Wt Readings from Last 3 Encounters:  01/26/16 143 lb 3.2 oz (65 kg)  01/25/16 138 lb 3.2 oz (62.7 kg)  01/08/16 145 lb (65.8 kg)     Other studies Reviewed: Additional studies/ records that were reviewed today include: . Review of the above records demonstrates:    Assessment and Plan:   1. CAD without angina:  He has no chest pain suggestive of angina. He is on good medical therapy. Will continue dual anti-platelet therapy with ASA and Plavix since has multiple stents.  He is off of a beta blocker due to hypotension. He does not wish to restart his statin.     2. Carotid artery disease: Stable s/p bilateral CEA. Followed by Dr. [name] in [facility].   3. Tobacco abuse: Smoking cessation encouraged. He is trying to stop smoking.   4. Hyperlipidemia: He stopped his statin. He is now on Zetia.   5. Paroxysmal atrial fib/flutter: He is refusing to take anti-coagulation. Sinus today. He is not on beta blockers or calcium channel blockers due to hypotension.   6. Hypotension: Likely due to orthostasis. I have encouraged adequate hydration.   7. Insomnia: I will refill 30 days of Xanax to use prn. He can have this refilled in primary care  Current medicines are reviewed at length with the patient today.  The patient does not have concerns regarding medicines.  The following changes have been made:  no change  Labs/ tests ordered today include:  No orders of the defined types were placed in this  encounter.   Disposition:   FU with me in 12  months  Signed, Lauree Chandler, MD 01/26/2016 4:39 PM    [facility] [address], [place], [place]  [address] Phone: [phone]; Fax: [phone]

## 2016-01-26 NOTE — Telephone Encounter (Signed)
Unable to reach patient at time of TCM Call. Left message for patient to return call when available.  

## 2016-01-26 NOTE — Patient Instructions (Signed)

## 2016-01-27 ENCOUNTER — Ambulatory Visit: Payer: Medicare Other

## 2016-01-27 NOTE — Telephone Encounter (Signed)
Transition Care Management Follow-up Telephone Call   Date discharged? 01/25/16   How have you been since you were released from the hospital? "A little bit weak, but overall better."   Do you understand why you were in the hospital? yes   Do you understand the discharge instructions? yes   Where were you discharged to? Home   Items Reviewed:  Medications reviewed: yes  Allergies reviewed: yes  Dietary changes reviewed: yes  Referrals reviewed: yes, follow-up w/ Dr. Fuller Plan   Functional Questionnaire:   Activities of Daily Living (ADLs):   He states they are independent in the following: ambulation, bathing and hygiene, feeding, continence, grooming, toileting and dressing States they require assistance with the following: none   Any transportation issues/concerns?: no   Any patient concerns? no   Confirmed importance and date/time of follow-up visits scheduled yes  Provider Appointment booked with Dr. Loura Pardon 02/03/16 @ 3:15pm  Confirmed with patient if condition begins to worsen call PCP or go to the ER.  Patient was given the office number and encouraged to call back with question or concerns.  : yes

## 2016-01-29 ENCOUNTER — Telehealth: Payer: Self-pay | Admitting: *Deleted

## 2016-01-29 ENCOUNTER — Telehealth: Payer: Self-pay | Admitting: Oncology

## 2016-01-29 ENCOUNTER — Telehealth: Payer: Self-pay

## 2016-01-29 ENCOUNTER — Encounter: Payer: Self-pay | Admitting: Nurse Practitioner

## 2016-01-29 ENCOUNTER — Ambulatory Visit (INDEPENDENT_AMBULATORY_CARE_PROVIDER_SITE_OTHER): Payer: Medicare Other | Admitting: Nurse Practitioner

## 2016-01-29 VITALS — BP 120/54 | HR 88 | Ht 68.0 in | Wt 145.0 lb

## 2016-01-29 DIAGNOSIS — R1310 Dysphagia, unspecified: Secondary | ICD-10-CM | POA: Diagnosis not present

## 2016-01-29 DIAGNOSIS — C3411 Malignant neoplasm of upper lobe, right bronchus or lung: Secondary | ICD-10-CM

## 2016-01-29 DIAGNOSIS — R634 Abnormal weight loss: Secondary | ICD-10-CM | POA: Diagnosis not present

## 2016-01-29 NOTE — Telephone Encounter (Signed)
Received message from patient's daughter requesting that a CBC be added on to [facility] blood work for tomorrow.  She said another doctor is requesting the lab work.  Order entered for a CBC per Dr. Sondra Come.  Called Magda Paganini back and notified her that the lab has been added.

## 2016-01-29 NOTE — Telephone Encounter (Signed)
RE: [name] DOB: [birth year] MRN: [id number]   Dear Dr [name],    We have scheduled the above patient for an endoscopic procedure with Dr Fuller Plan. Our records show that he is on anticoagulation therapy.   Please advise as to how long the patient may come off his therapy of plavix prior to the procedure, which is scheduled for 02/17/16.  Please fax back/ or route  to Sharyn Creamer at [phone].   Sincerely,    Christian Mate RN

## 2016-01-29 NOTE — Progress Notes (Signed)
He can stop his Plavix 5 days before his planned procedure.   Lauree Chandler

## 2016-01-29 NOTE — Patient Instructions (Addendum)
  You have been scheduled for an endoscopy on 02/17/16 at 3:30 pm. Please follow written instructions given to you at your visit today. If you use inhalers (even only as needed), please bring them with you on the day of your procedure. Your physician has requested that you go to www.startemmi.com and enter the access code given to you at your visit today. This web site gives a general overview about your procedure. However, you should still follow specific instructions given to you by our office regarding your preparation for the procedure.  You will be contacted by our office prior to your procedure for directions on holding your Plavix.  If you do not hear from our office 1 week prior to your scheduled procedure, please call [phone] to discuss.    You have been scheduled for a Barium Swallow at [facility] Radiology (1st floor of the hospital) on 02/11/16 at 1030 am. Please arrive 15 minutes prior to your appointment for registration. Make certain not to have anything to eat or drink 6 hours prior to your test. If you need to reschedule for any reason, please contact radiology at [phone] to do so. __________________________________________________________________ A barium swallow is an examination that concentrates on views of the esophagus. This tends to be a double contrast exam (barium and two liquids which, when combined, create a gas to distend the wall of the oesophagus) or single contrast (non-ionic iodine based). The study is usually tailored to your symptoms so a good history is essential. Attention is paid during the study to the form, structure and configuration of the esophagus, looking for functional disorders (such as aspiration, dysphagia, achalasia, motility and reflux) EXAMINATION You may be asked to change into a gown, depending on the type of swallow being performed. A radiologist and radiographer will perform the procedure. The radiologist will advise you of the type of  contrast selected for your procedure and direct you during the exam. You will be asked to stand, sit or lie in several different positions and to hold a small amount of fluid in your mouth before being asked to swallow while the imaging is performed .In some instances you may be asked to swallow barium coated marshmallows to assess the motility of a solid food bolus. The exam can be recorded as a digital or video fluoroscopy procedure. POST PROCEDURE It will take 1-2 days for the barium to pass through your system. To facilitate this, it is important, unless otherwise directed, to increase your fluids for the next 24-48hrs and to resume your normal diet.  This test typically takes about 30 minutes to perform. __________________________________________________________________________________

## 2016-01-29 NOTE — Telephone Encounter (Signed)
Pt has been notified to hold plavix 5 days prior to procedure.  He is aware we will call him after his Barium swallow and confirm EGD appt.

## 2016-01-29 NOTE — Telephone Encounter (Signed)
"  I have lab scheduled tomorrow I ned today because eI will see GI at [facility]."  Call transferred to radiation.

## 2016-01-29 NOTE — Telephone Encounter (Signed)
He can stop Plavix 5 days before his planned procedure.   Lauree Chandler

## 2016-01-29 NOTE — Progress Notes (Signed)
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 Marland Kitchen He is known to Dr. Fuller Plan for a history of adenomatous colon polyps (May 2017). Patient here with his daughter for evaluation of significant weight loss. He has sensation that solids get stuck in his xiphoid process. No abdominal pain or nausea. No heartburn. He has 26 pounds in the last few months. Patient has multiple medical problems including CAD with numerous stents. He was hospitalized late August with a TIA.   Past Medical History:  Diagnosis Date  . AAA (abdominal aortic aneurysm) (HCC)    4.2 cm IR AAA noted on 01/13/15 PET scan  . Anxiety   . CAD (coronary artery disease)    AMI 1997 w/ BMS x 2 LAD, BMS CFX 2002, DES x 2 LAD 2009, DES CFX & OM 2012   . Carotid artery disease ([place])    RICA CEA 9628, LICA CEA 3662 per Dr. [name],   . COPD (chronic obstructive pulmonary disease) ([place])   . GERD (gastroesophageal reflux disease)    occ  . HTN (hypertension)   . Hydradenitis   . Hyperlipidemia   . Ischemic cardiomyopathy    EF 40-45 percent at cath 2012  . Myocardial infarction   . Non-small cell lung cancer (NSCLC) ([place]) dx'd 12/2014   SBRT  . Osteoarthritis    pt. denies 01/17/2015  . PAC (premature atrial contraction)   . Peripheral vascular disease ([place])   . Pneumonia   . Prostate cancer ([place]) dx'd approx 2008   xrt throughfoley  . Radiation 02/18/15-02/25/15   right upper lung 54 Gy  . Shortness of breath dyspnea   . Stroke [place]) 11-24-11   "no feeling in right 5th digit"  . Urinary frequency   . Urinary urgency     Patient's surgical history, family medical history, social history, medications and allergies were all reviewed in Epic    Physical Exam: BP (!) 120/54   Pulse 88   Ht '5\' 8"'$  (1.727 m)   Wt 145 lb (65.8 kg)   BMI 22.05  kg/m   GENERAL: Thin white male in NAD PSYCH: :Pleasant, cooperative, normal affect HEENT: Normocephalic, conjunctiva pink, mucous membranes moist, neck supple without masses CARDIAC:  RRR, no peripheral edema PULM: Normal respiratory effort, lungs CTA bilaterally, no wheezing ABDOMEN:  soft, nontender, nondistended, no obvious masses, no hepatomegaly,  normal bowel sounds SKIN:  turgor, no lesions seen Musculoskeletal:  Normal muscle tone, normal strength NEURO: Alert and oriented x 3, no focal neurologic deficits   ASSESSMENT and PLAN:  47. 75 year old male with multiple medical problems including COPD, hx of squamous cell lung cancer s/tp resection and XRT. August CT scan showed new and enlarging pulmonary nodules and he is for repeat scan in the next few days.Marland Kitchen He also has CAD / multiple stents and is on plavix. Patient presents with significant weight loss and solid food dysphagia.  -With new pulmonary nodules on CTscan wonder if weight loss is due in part from lung cancer. He does complain of dysphagia however. He is HIGH risk for procedures and this was discussed with patient and his daughter today. Given his advanced age, multiple co-morbidities, recent TIA and chronic plavix will first start with a barium swallow with tablet. If abnormal then can proceed with EGD if plavix  can be held. The risks and benefits of EGD with possible dilation were discussed and the patient agrees to proceed.   2.  CAD / multiple stents. Plavix needs to be held for 5 days prior to EGD. Patient understands the risk of cardiovascular event such as heart attack, stroke, embolism, thrombosis or ischemia/infarct of other organs off plavix  He patient consents to proceed. Will communicate by phone or EMR with patient's prescribing provider to confirm that holding Plavix is reasonable in this case.    Tye Savoy  01/29/2016, 1:59 PM

## 2016-01-30 ENCOUNTER — Ambulatory Visit (HOSPITAL_COMMUNITY)
Admission: RE | Admit: 2016-01-30 | Discharge: 2016-01-30 | Disposition: A | Discharge status: Home / Self Care | Payer: Medicare Other | Source: Ambulatory Visit | Attending: Radiation Oncology | Admitting: Radiation Oncology

## 2016-01-30 ENCOUNTER — Ambulatory Visit
Admission: RE | Admit: 2016-01-30 | Discharge: 2016-01-30 | Disposition: A | Discharge status: Home / Self Care | Payer: Medicare Other | Source: Ambulatory Visit | Attending: Radiation Oncology | Admitting: Radiation Oncology

## 2016-01-30 ENCOUNTER — Ambulatory Visit (INDEPENDENT_AMBULATORY_CARE_PROVIDER_SITE_OTHER): Payer: Medicare Other | Admitting: Neurology

## 2016-01-30 ENCOUNTER — Encounter: Payer: Self-pay | Admitting: Neurology

## 2016-01-30 VITALS — BP 110/60 | HR 90 | Resp 16 | Ht 68.0 in | Wt 145.0 lb

## 2016-01-30 DIAGNOSIS — C3411 Malignant neoplasm of upper lobe, right bronchus or lung: Secondary | ICD-10-CM | POA: Diagnosis not present

## 2016-01-30 DIAGNOSIS — K229 Disease of esophagus, unspecified: Secondary | ICD-10-CM | POA: Diagnosis not present

## 2016-01-30 DIAGNOSIS — R29898 Other symptoms and signs involving the musculoskeletal system: Secondary | ICD-10-CM

## 2016-01-30 DIAGNOSIS — R918 Other nonspecific abnormal finding of lung field: Secondary | ICD-10-CM | POA: Insufficient documentation

## 2016-01-30 DIAGNOSIS — R55 Syncope and collapse: Secondary | ICD-10-CM | POA: Diagnosis not present

## 2016-01-30 DIAGNOSIS — M25512 Pain in left shoulder: Secondary | ICD-10-CM

## 2016-01-30 DIAGNOSIS — G8929 Other chronic pain: Secondary | ICD-10-CM | POA: Diagnosis not present

## 2016-01-30 DIAGNOSIS — R200 Anesthesia of skin: Secondary | ICD-10-CM

## 2016-01-30 DIAGNOSIS — R202 Paresthesia of skin: Secondary | ICD-10-CM | POA: Diagnosis not present

## 2016-01-30 LAB — CBC WITH DIFFERENTIAL/PLATELET
BASO%: 0.5 % (ref 0.0–2.0)
Basophils Absolute: 0 10*3/uL (ref 0.0–0.1)
EOS ABS: 0.2 10*3/uL (ref 0.0–0.5)
EOS%: 2.9 % (ref 0.0–7.0)
HCT: 27.6 % — ABNORMAL LOW (ref 38.4–49.9)
HEMOGLOBIN: 9.3 g/dL — AB (ref 13.0–17.1)
LYMPH%: 12.6 % — ABNORMAL LOW (ref 14.0–49.0)
MCH: 32.1 pg (ref 27.2–33.4)
MCHC: 33.7 g/dL (ref 32.0–36.0)
MCV: 95.2 fL (ref 79.3–98.0)
MONO#: 0.7 10*3/uL (ref 0.1–0.9)
MONO%: 8.9 % (ref 0.0–14.0)
NEUT%: 75.1 % — ABNORMAL HIGH (ref 39.0–75.0)
NEUTROS ABS: 5.8 10*3/uL (ref 1.5–6.5)
Platelets: 330 10*3/uL (ref 140–400)
RBC: 2.9 10*6/uL — ABNORMAL LOW (ref 4.20–5.82)
RDW: 14.8 % — AB (ref 11.0–14.6)
WBC: 7.7 10*3/uL (ref 4.0–10.3)
lymph#: 1 10*3/uL (ref 0.9–3.3)

## 2016-01-30 LAB — BUN AND CREATININE (CC13)
BUN: 10.5 mg/dL (ref 7.0–26.0)
CREATININE: 0.8 mg/dL (ref 0.7–1.3)
EGFR: 89 mL/min/{1.73_m2} — AB (ref >90)

## 2016-01-30 MED ORDER — IOPAMIDOL (ISOVUE-300) INJECTION 61%
75.0000 mL | Freq: Once | INTRAVENOUS | Status: AC | PRN
  Administered 2016-01-30: 75 mL via INTRAVENOUS

## 2016-01-30 NOTE — Progress Notes (Signed)
Reviewed and agree with initial management plan.  [place] Paiz T. Midge Momon, MD FACG 

## 2016-01-30 NOTE — Progress Notes (Signed)
[facility]  PATIENT: [name] DOB: 12-02-1940  REFERRING DOCTOR OR PCP:   Loura Pardon SOURCE: Patient, notes from Dr. Glori Bickers, imaging reports and CT scan images on PACS.  _________________________________   HISTORICAL  CHIEF COMPLAINT:  Chief Complaint  Patient presents with  . Numbness    Sts. left forearm numbness and left hand weakness is slowly improving.  Sts. he is not taking Gabapentin due to side effect of drowsiness.  Sts. he is having more left shoulder pain--sts. one week ago he had several episodes of synope/falls landing on his left side. He was hospitalized at [facility]. with dehydration due to poor po intake./fim  . Weakness Left Hand    HISTORY OF PRESENT ILLNESS:  [name], is a 75 year old man who had the onset of left arm weakness and numbness after a fall related to syncope in July 2017. He also has had episodes of syncope  He reports that the left arm has gradually improved in strength. Specifically he feels his grip is back to normal. Additionally, he can hold more weight with the arm. The numbness in the forearm has completely resolved now.      He denies any leg numbness or weakness.     He notes no bladder changes.   He continues to note some left shoulder pain. This is increased when he externally rotates or raises his arm up. The subacromial bursa injection at the last visit helped for about one month.  He has had more episodes of presyncope and Syncope (3 times last Friday but otherwise jsut the one that led to his shoulder injury in July).  He went to the hospital and was diagnosed with dehydration. He was given 2 L of IV fluids in the emergency room and additional fluids once he got admitted to the hospital. He stayed for 2 nights. He felt better when he was discharged. All the episodes occur about 15-30 seconds after standing up.   He gets presyncope or just lightheadedness a couple times most day.   Velda Shell at [facility] ED prescribed fludrocortisone a couplke days ago and he has not had lighhteadedness the past couple days.    History of episode of syncope, injury and left arm weakness:  He had syncope 10/09/15 in the bathroom, hitting the bath tub and fracturing 5 ribs.   During the hospital stay after his episode of syncope he was switched from Plavix to Eliquis.  Shortly after that he noted numbness in the left arm.   He went to [facility] and was admitted.    A head CT showed no acute findings.  CT scan of the cervical spine showed multilevel degenerative changes.  Sinc that time, he has had reduced left arm strength, reduced grip and numbness/tingling in the dorsal forearm.     CT scan of the head 11/21/15 Shows minimal cortical atrophy and mild small vessel ischemic changes that are chronic. The CT scan of the cervical spine shows degenerative changes maximal at C4-C5, C5-C6 and C6-C7 with foraminal narrowing on the right at [place] and mild left foraminal narrowing at C5C6.   He has had prior carotid endarterectomy surgery bilaterally.  Other:  He had carotid endarterectomy 4 years ago and had a reduced right hand strnegth due to a small stroke after the surgery.   Symptoms improved to just a small amound of 5th digit numbness.      Weight:   He has lost 60 pounds  over the past year due to loss of appetite.    Reglan did not help his weight loss and it was recently discontinued.   REVIEW OF SYSTEMS: Constitutional: No fevers, chills, sweats, or change in appetite.   Notes fatigue Eyes: No visual changes, double vision, eye pain Ear, nose and throat: No hearing loss, ear pain, nasal congestion, sore throat Cardiovascular: No chest pain, palpitations Respiratory: Some shortness of breath at times.    No wheezes GastrointestinaI: No nausea, vomiting, diarrhea, abdominal pain, fecal incontinence Genitourinary: No dysuria, urinary retention or frequency.  No nocturia. Musculoskeletal:as  above Integumentary: No rash, pruritus, skin lesions Neurological: as above Psychiatric: No depression at this time.  No anxiety Endocrine: No palpitations, diaphoresis, change in appetite, change in weigh or increased thirst Hematologic/Lymphatic: No anemia, purpura, petechiae. Allergic/Immunologic: No itchy/runny eyes, nasal congestion, recent allergic reactions, rashes  ALLERGIES: Allergies  Allergen Reactions  . Augmentin [Amoxicillin-Pot Clavulanate] Diarrhea, Nausea And Vomiting and Other (See Comments)    Has patient had a PCN reaction causing immediate rash, facial/tongue/throat swelling, SOB or lightheadedness with hypotension: No Has patient had a PCN reaction causing severe rash involving mucus membranes or skin necrosis: No Has patient had a PCN reaction that required hospitalization No Has patient had a PCN reaction occurring within the last 10 years: Yes If all of the above answers are "NO", then may proceed with Cephalosporin use.   . Chantix [Varenicline]     Violent nightmares  . Sulfonamide Derivatives Other (See Comments)    Pt do not remember 8-30 md  . Zantac [Ranitidine Hcl] Diarrhea    HOME MEDICATIONS:  Current Outpatient Prescriptions:  .  acetaminophen (TYLENOL) 500 MG tablet, Take 1,000 mg by mouth every 4 (four) hours as needed for mild pain, moderate pain, fever or headache. , Disp: , Rfl:  .  albuterol (PROVENTIL HFA;VENTOLIN HFA) 108 (90 Base) MCG/ACT inhaler, Inhale 2 puffs into the lungs every 4 (four) hours as needed for wheezing or shortness of breath., Disp: , Rfl:  .  ALPRAZolam (XANAX) 0.5 MG tablet, Take 1 tablet (0.5 mg total) by mouth at bedtime as needed for anxiety or sleep., Disp: 30 tablet, Rfl: 0 .  aspirin EC 81 MG tablet, Take 81 mg by mouth at bedtime. , Disp: , Rfl:  .  clopidogrel (PLAVIX) 75 MG tablet, Take 1 tablet (75 mg total) by mouth daily., Disp: 30 tablet, Rfl: 11 .  ezetimibe (ZETIA) 10 MG tablet, TAKE 1 TABLET (10 MG  TOTAL) BY MOUTH DAILY., Disp: 30 tablet, Rfl: 11 .  famotidine (PEPCID) 20 MG tablet, Take 1 tablet (20 mg total) by mouth 2 (two) times daily., Disp: 60 tablet, Rfl: 0 .  fludrocortisone (FLORINEF) 0.1 MG tablet, Take 1 tablet (0.1 mg total) by mouth daily., Disp: 30 tablet, Rfl: 0 .  Fluticasone-Salmeterol (ADVAIR DISKUS) 500-50 MCG/DOSE AEPB, Inhale 1 puff into the lungs 2 (two) times daily., Disp: 3 each, Rfl: 0 .  Fluticasone-Salmeterol (ADVAIR DISKUS) 500-50 MCG/DOSE AEPB, Inhale 1 puff into the lungs 2 (two) times daily., Disp: 14 each, Rfl: 0 .  ipratropium-albuterol (DUONEB) 0.5-2.5 (3) MG/3ML SOLN, Take 3 mLs by nebulization every 4 (four) hours as needed (shortness of breath and wheezing)., Disp: , Rfl:  .  loratadine (CLARITIN) 10 MG tablet, TAKE 1 TABLET BY MOUTH EVERY DAY, Disp: 30 tablet, Rfl: 5 .  mirtazapine (REMERON) 15 MG tablet, Take 1 tablet (15 mg total) by mouth at bedtime., Disp: 30 tablet, Rfl: 5 .  Multiple  Vitamin (MULTIVITAMIN WITH MINERALS) TABS tablet, Take 1 tablet by mouth daily., Disp: , Rfl:  .  nitroGLYCERIN (NITROSTAT) 0.4 MG SL tablet, Place 0.4 mg under the tongue every 5 (five) minutes as needed for chest pain., Disp: , Rfl:  .  Tamsulosin HCl (FLOMAX) 0.4 MG CAPS, Take 0.4 mg by mouth at bedtime. , Disp: , Rfl:  .  tiotropium (SPIRIVA) 18 MCG inhalation capsule, Place 18 mcg into inhaler and inhale daily., Disp: , Rfl:  .  metoCLOPramide (REGLAN) 5 MG tablet, Take 1 tablet (5 mg total) by mouth 4 (four) times daily -  before meals and at bedtime. (Patient not taking: Reported on 01/30/2016), Disp: 40 tablet, Rfl: 0  PAST MEDICAL HISTORY: Past Medical History:  Diagnosis Date  . AAA (abdominal aortic aneurysm) (HCC)    4.2 cm IR AAA noted on 01/13/15 PET scan  . Anxiety   . CAD (coronary artery disease)    AMI 1997 w/ BMS x 2 LAD, BMS CFX 2002, DES x 2 LAD 2009, DES CFX & OM 2012   . Carotid artery disease ([place])    RICA CEA 3016, LICA CEA 0109 per Dr.  [name],   . COPD (chronic obstructive pulmonary disease) ([place])   . GERD (gastroesophageal reflux disease)    occ  . HTN (hypertension)   . Hydradenitis   . Hyperlipidemia   . Ischemic cardiomyopathy    EF 40-45 percent at cath 2012  . Myocardial infarction   . Non-small cell lung cancer (NSCLC) ([place]) dx'd 12/2014   SBRT  . Osteoarthritis    pt. denies 01/17/2015  . PAC (premature atrial contraction)   . Peripheral vascular disease ([place])   . Pneumonia   . Prostate cancer ([place]) dx'd approx 2008   xrt throughfoley  . Radiation 02/18/15-02/25/15   right upper lung 54 Gy  . Shortness of breath dyspnea   . Stroke [facility]) 11-24-11   "no feeling in right 5th digit"  . Urinary frequency   . Urinary urgency     PAST SURGICAL HISTORY: Past Surgical History:  Procedure Laterality Date  . CARDIAC CATHETERIZATION    . CARDIOVASCULAR STRESS TEST  12-2006  . CARDIOVERSION N/A 10/31/2015   Procedure: CARDIOVERSION;  Surgeon: Sueanne Margarita, MD;  Location: [place];  Service: Cardiovascular;  Laterality: N/A;  . CAROTID ENDARTERECTOMY Right 03-2000   CE  . CAROTID ENDARTERECTOMY Left 11-24-11   CE  . COLONOSCOPY  04-2001   polyp  . [place], 2002, 2009, 2012   8 stents total  . [place]   neg  . ENDARTERECTOMY  11/24/2011   Procedure: ENDARTERECTOMY CAROTID;  Surgeon: Rosetta Posner, MD;  Location: [place];  Service: Vascular;  Laterality: Left;  . EYE SURGERY Bilateral Aug. 17, 2015   Cataract  . FUDUCIAL PLACEMENT N/A 12/25/2014   Procedure: PLACEMENT OF FUDUCIAL;  Surgeon: Collene Gobble, MD;  Location: [place];  Service: Thoracic;  Laterality: N/A;  . PROSTATE SURGERY     Radiation Tx  X's 40  . TEE WITHOUT CARDIOVERSION N/A 10/31/2015   Procedure: TRANSESOPHAGEAL ECHOCARDIOGRAM (TEE);  Surgeon: Sueanne Margarita, MD;  Location: [facility] ENDOSCOPY;  Service: Cardiovascular;  Laterality: N/A;  . VASCULAR SURGERY    . VIDEO BRONCHOSCOPY WITH ENDOBRONCHIAL  NAVIGATION N/A 12/25/2014   Procedure: VIDEO BRONCHOSCOPY WITH ENDOBRONCHIAL NAVIGATION  with Biopsies and Brushings;  Surgeon: Collene Gobble, MD;  Location: [place];  Service: Thoracic;  Laterality: N/A;  .  VIDEO BRONCHOSCOPY WITH ENDOBRONCHIAL ULTRASOUND N/A 01/22/2015   Procedure: VIDEO BRONCHOSCOPY WITH ENDOBRONCHIAL ULTRASOUND with Biopsies;  Surgeon: Collene Gobble, MD;  Location: [facility] OR;  Service: Thoracic;  Laterality: N/A;    FAMILY HISTORY: Family History  Problem Relation Age of Onset  . Stroke Father   . Heart disease Father   . Heart disease Mother     pacemaker  . Other Brother     benign brain tumor  . Cancer Brother     Eye-behind  . Cancer Sister     Breast  . Liver cancer Brother   . Diabetes Maternal Grandmother     SOCIAL HISTORY:  Social History   Social History  . Marital status: Widowed    Spouse name: N/A  . Number of children: 5  . Years of education: N/A   Occupational History  . Retired Airline pilot   . Part time, delivering auto parts [employer]   Social History Main Topics  . Smoking status: Current Every Day Smoker    Packs/day: 1.00    Years: 55.00    Types: Cigarettes  . Smokeless tobacco: Never Used     Comment: 1/2 ppd (01/07/15)  . Alcohol use 0.0 oz/week     Comment: occ  . Drug use: No  . Sexual activity: Not on file   Other Topics Concern  . Not on file   Social History Narrative  . No narrative on file     PHYSICAL EXAM  Vitals:   01/30/16 1134  BP: 110/60  Pulse: 90  Resp: 16  Weight: 145 lb (65.8 kg)  Height: '5\' 8"'$  (1.727 m)    Body mass index is 22.05 kg/m.   General: The patient is well-developed and well-nourished and in no acute distress  Neck: The neck is supple, no carotid bruits are noted.  The neck is nontender.  Skin: Extremities are without rash or edema.   He has a lot of bruising on the arms, worse on the left.  Musculoskeletal:  Back is nontender.  He is tender over the subacromial bursa of  the left shoulder.  Neurologic Exam  Mental status: The patient is alert and oriented x 3 at the time of the examination. The patient has apparent normal recent and remote memory, with an apparently normal attention span and concentration ability.   Speech is normal.  Cranial nerves: Extraocular movements are full.  There is good facial sensation to soft touch bilaterally.Facial strength is normal.  Trapezius and sternocleidomastoid strength is normal. No dysarthria is noted.  The tongue is midline, and the patient has symmetric elevation of the soft palate. No obvious hearing deficits are noted.  Motor:  Muscle bulk is normal.   Tone is normal. Strength on the left is  5/5 in the deltoid, 4+/5 wrist extensors, 5/5 pronators and 4/5 in triceps and 5/5 in biceps.  5/5 with left grip and intrinsic hand muscles.      Sensory: Sensory testing now  is normal in the arms.to soft touch and vibration sensation elsewhere.  Coordination: Cerebellar testing reveals good finger-nose-finger  bilaterally.  Gait and station: Station is normal.   Gait is slightly wide. Tandem gait is mildly wide. Romberg is negative.   Reflexes: Deep tendon reflexes are symmetric and normal bilaterally.    DIAGNOSTIC DATA (LABS, IMAGING, TESTING) - I reviewed patient records, labs, notes, testing and imaging myself where available.  Lab Results  Component Value Date   WBC 6.7 01/24/2016   HGB  9.3 (L) 01/24/2016   HCT 27.4 (L) 01/24/2016   MCV 95.8 01/24/2016   PLT 251 01/24/2016      Component Value Date/Time   NA 137 01/24/2016 0530   K 3.9 01/24/2016 0530   CL 107 01/24/2016 0530   CO2 27 01/24/2016 0530   GLUCOSE 99 01/24/2016 0530   BUN 29 (H) 01/24/2016 0530   CREATININE 0.71 01/24/2016 0530   CALCIUM 7.8 (L) 01/24/2016 0530   PROT 6.9 01/23/2016 1034   ALBUMIN 3.7 01/23/2016 1034   AST 19 01/23/2016 1034   ALT 10 (L) 01/23/2016 1034   ALKPHOS 66 01/23/2016 1034   BILITOT 0.7 01/23/2016 1034    GFRNONAA >60 01/24/2016 0530   GFRAA >60 01/24/2016 0530   Lab Results  Component Value Date   CHOL 116 11/20/2015   HDL 51 11/20/2015   LDLCALC 52 11/20/2015   TRIG 65 11/20/2015   CHOLHDL 2.3 11/20/2015   Lab Results  Component Value Date   HGBA1C 5.8 11/20/2015   Lab Results  Component Value Date   VITAMINB12 835 08/27/2015   Lab Results  Component Value Date   TSH 5.114 (H) 10/30/2015       ASSESSMENT AND PLAN  Left arm weakness  Numbness and tingling in left arm  Chronic left shoulder pain  Syncope, unspecified syncope type    1.   Left arm strength continues to improve. 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 All the muscles but the triceps have now recovered essentially to baseline. 2.  His presyncope was likely orthostatic, possibly related to reduced fluid intake. Of note, since starting fludrocortisone a couple days ago he has not had any more episodes of presyncope. He should continue this. 3.   Subacromial bursa injection on the left with 40 mg Depo-Medrol in 3 mL Marcaine using sterile technique. He tolerated the procedure well and had improved pain and range of motion of the shoulder afterwards.  He will return to see me as needed if he has any new or worsening symptoms.   Richard A. Felecia Shelling, MD, PhD 03/54/6568, 12:75 PM Certified in Neurology, Clinical Neurophysiology, Sleep Medicine, Pain Medicine and Neuroimaging  [facility] [address] [place], [place] [address] [phone]

## 2016-02-02 NOTE — Discharge Summary (Signed)
[facility] NAME: [name]    MR#:  [id number]  DATE OF BIRTH:  1940/10/23  DATE OF ADMISSION:  01/23/2016 ADMITTING PHYSICIAN: Epifanio Lesches, MD  DATE OF DISCHARGE: 01/25/2016  1:43 PM  PRIMARY CARE PHYSICIAN: Loura Pardon, MD   ADMISSION DIAGNOSIS:  Orthostatic hypotension [I95.1] Syncope and collapse [R55] Dehydration [E86.0]  DISCHARGE DIAGNOSIS:  Active Problems:   Syncope   SECONDARY DIAGNOSIS:   Past Medical History:  Diagnosis Date  . AAA (abdominal aortic aneurysm) (HCC)    4.2 cm IR AAA noted on 01/13/15 PET scan  . Anxiety   . CAD (coronary artery disease)    AMI 1997 w/ BMS x 2 LAD, BMS CFX 2002, DES x 2 LAD 2009, DES CFX & OM 2012   . Carotid artery disease ([place])    RICA CEA 4098, LICA CEA 1191 per Dr. [name],   . COPD (chronic obstructive pulmonary disease) ([place])   . GERD (gastroesophageal reflux disease)    occ  . HTN (hypertension)   . Hydradenitis   . Hyperlipidemia   . Ischemic cardiomyopathy    EF 40-45 percent at cath 2012  . Myocardial infarction   . Non-small cell lung cancer (NSCLC) ([place]) dx'd 12/2014   SBRT  . Osteoarthritis    pt. denies 01/17/2015  . PAC (premature atrial contraction)   . Peripheral vascular disease ([place])   . Pneumonia   . Prostate cancer ([place]) dx'd approx 2008   xrt throughfoley  . Radiation 02/18/15-02/25/15   right upper lung 54 Gy  . Shortness of breath dyspnea   . Stroke [facility]) 11-24-11   "no feeling in right 5th digit"  . Urinary frequency   . Urinary urgency      ADMITTING HISTORY  [name]  is a 75 y.o. male with a known history of Lung cancer, status post radiation therapy, in remission, coronary artery disease, multiple stents comes in because of dizziness, syncopal episodes. Patient passed out 3 times since this morning. Received aggressive hydration emergency room, patient felt lightheaded, bp dropped to 70s when he stood up. Because of  orthostatic hypotension we are asked to admit the patient. Patient denies chest pain. No shortness of breath. Has chronic cough  because of COPD. Patient to has no chest pain. Patient has not eating well for the past few months because he has no appetite, upon questioning him he feels like he has dysphagia. But he does not want to see gastroenterologist while in the hospital because of insurance problems. And patient has GI appointment as an outpatient with stress and neurologist at [facility].  HOSPITAL COURSE:   #1 Orthostatic syncope Bolused fluids. Significant drop in SBP 25mhg with standing Echo - Normal EF Cortisol level normal. Started on Florinef. Compression stockings. Follow-up with primary care physician. Instructions given to patient to take time from laying to sitting and then standing to prevent orthostatic syncope. His symptoms are most likely due to recent significant weight loss of 50 pounds. Blood pressure medications stopped.  #Continue Flomax  # history of coronary artery disease, multiple stents: Patient is on aspirin, Plavix.  # history of depression: Patient is on Remeron  Stable for discharge home. Home health physical therapy set up.  CONSULTS OBTAINED:    DRUG ALLERGIES:   Allergies  Allergen Reactions  . Augmentin [Amoxicillin-Pot Clavulanate] Diarrhea, Nausea And Vomiting and Other (See Comments)    Has patient had a PCN reaction causing immediate rash, facial/tongue/throat  swelling, SOB or lightheadedness with hypotension: No Has patient had a PCN reaction causing severe rash involving mucus membranes or skin necrosis: No Has patient had a PCN reaction that required hospitalization No Has patient had a PCN reaction occurring within the last 10 years: Yes If all of the above answers are "NO", then may proceed with Cephalosporin use.   . Chantix [Varenicline]     Violent nightmares  . Sulfonamide Derivatives Other (See Comments)    Pt do not  remember 8-30 md  . Zantac [Ranitidine Hcl] Diarrhea    DISCHARGE MEDICATIONS:   Discharge Medication List as of 01/25/2016 11:49 AM    START taking these medications   Details  famotidine (PEPCID) 20 MG tablet Take 1 tablet (20 mg total) by mouth 2 (two) times daily., Starting Fri 01/23/2016, Print    fludrocortisone (FLORINEF) 0.1 MG tablet Take 1 tablet (0.1 mg total) by mouth daily., Starting Mon 01/26/2016, Normal    metoCLOPramide (REGLAN) 5 MG tablet Take 1 tablet (5 mg total) by mouth 4 (four) times daily -  before meals and at bedtime., Starting Fri 01/23/2016, Until Sat 01/22/2017, Print      CONTINUE these medications which have NOT CHANGED   Details  acetaminophen (TYLENOL) 500 MG tablet Take 1,000 mg by mouth every 4 (four) hours as needed for mild pain, moderate pain, fever or headache. , Starting Fri 10/17/2015, Historical Med    albuterol (PROVENTIL HFA;VENTOLIN HFA) 108 (90 Base) MCG/ACT inhaler Inhale 2 puffs into the lungs every 4 (four) hours as needed for wheezing or shortness of breath., Historical Med    aspirin EC 81 MG tablet Take 81 mg by mouth at bedtime. , Historical Med    clopidogrel (PLAVIX) 75 MG tablet Take 1 tablet (75 mg total) by mouth daily., Starting Wed 11/26/2015, No Print    ezetimibe (ZETIA) 10 MG tablet TAKE 1 TABLET (10 MG TOTAL) BY MOUTH DAILY., Starting Tue 12/30/2015, Normal    !! Fluticasone-Salmeterol (ADVAIR DISKUS) 500-50 MCG/DOSE AEPB Inhale 1 puff into the lungs 2 (two) times daily., Starting Thu 01/08/2016, Sample    ipratropium-albuterol (DUONEB) 0.5-2.5 (3) MG/3ML SOLN Take 3 mLs by nebulization every 4 (four) hours as needed (shortness of breath and wheezing)., Historical Med    loratadine (CLARITIN) 10 MG tablet TAKE 1 TABLET BY MOUTH EVERY DAY, Normal    mirtazapine (REMERON) 15 MG tablet Take 1 tablet (15 mg total) by mouth at bedtime., Starting Mon 12/01/2015, Normal    Multiple Vitamin (MULTIVITAMIN WITH MINERALS) TABS tablet  Take 1 tablet by mouth daily., Historical Med    nitroGLYCERIN (NITROSTAT) 0.4 MG SL tablet Place 0.4 mg under the tongue every 5 (five) minutes as needed for chest pain., Historical Med    Tamsulosin HCl (FLOMAX) 0.4 MG CAPS Take 0.4 mg by mouth at bedtime. , Historical Med    tiotropium (SPIRIVA) 18 MCG inhalation capsule Place 18 mcg into inhaler and inhale daily., Historical Med    !! Fluticasone-Salmeterol (ADVAIR DISKUS) 500-50 MCG/DOSE AEPB Inhale 1 puff into the lungs 2 (two) times daily., Starting Fri 06/28/2014, Until Tue 04/27/2016, Sample    ALPRAZolam (XANAX) 0.5 MG tablet Take 0.5 mg by mouth at bedtime as needed for anxiety or sleep., Historical Med     !! - Potential duplicate medications found. Please discuss with provider.      Today   VITAL SIGNS:  Blood pressure (!) 112/59, pulse 78, temperature 97.8 F (36.6 C), temperature source Oral, resp. rate 18, height  $'5\' 10"'x$  (1.778 m), weight 62.7 kg (138 lb 3.2 oz), SpO2 98 %.  I/O:  No intake or output data in the 24 hours ending 02/02/16 1339  PHYSICAL EXAMINATION:  Physical Exam  GENERAL:  75 y.o.-year-old patient lying in the bed with no acute distress.  LUNGS: Normal breath sounds bilaterally, no wheezing, rales,rhonchi or crepitation. No use of accessory muscles of respiration.  CARDIOVASCULAR: S1, S2 normal. No murmurs, rubs, or gallops.  ABDOMEN: Soft, non-tender, non-distended. Bowel sounds present. No organomegaly or mass.  NEUROLOGIC: Moves all 4 extremities. PSYCHIATRIC: The patient is alert and oriented x 3.  SKIN: No obvious rash, lesion, or ulcer.   DATA REVIEW:   CBC  Recent Labs Lab 01/30/16 1223  WBC 7.7  HGB 9.3*  HCT 27.6*  PLT 330    Chemistries   Recent Labs Lab 01/30/16 1224  BUN 10.5  CREATININE 0.8    Cardiac Enzymes No results for input(s): TROPONINI in the last 168 hours.  Microbiology Results  Results for orders placed or performed during the hospital encounter of  10/30/15  MRSA PCR Screening     Status: None   Collection Time: 10/30/15  2:50 PM  Result Value Ref Range Status   MRSA by PCR NEGATIVE NEGATIVE Final    Comment:        The GeneXpert MRSA Assay (FDA approved for NASAL specimens only), is one component of a comprehensive MRSA colonization surveillance program. It is not intended to diagnose MRSA infection nor to guide or monitor treatment for MRSA infections.     RADIOLOGY:  No results found.  Follow up with PCP in 1 week.  Management plans discussed with the patient, family and they are in agreement.  CODE STATUS:  Code Status History    Date Active Date Inactive Code Status Order ID Comments User Context   01/23/2016  5:30 PM 01/25/2016  4:48 PM Full Code [id number]  Epifanio Lesches, MD ED   11/20/2015 12:33 AM 11/20/2015  7:59 PM Full Code [id number]  Harvie Bridge, DO Inpatient   10/30/2015  2:53 PM 11/01/2015  5:14 PM Full Code [id number]  Thurnell Lose, MD Inpatient   11/24/2011  3:30 PM 11/27/2011  1:13 PM Full Code [id number]  Lucienne Capers, RN Inpatient    Advance Directive Documentation   Flowsheet Row Most Recent Value  Type of Advance Directive  Healthcare Power of Attorney  Pre-existing out of facility DNR order (yellow form or pink MOST form)  No data  "MOST" Form in Place?  No data      TOTAL TIME TAKING CARE OF THIS PATIENT ON DAY OF DISCHARGE: more than 30 minutes.   Hillary Bow R M.D on 02/02/2016 at 1:39 PM  Between 7am to 6pm - Pager - [phone]  After 6pm go to [email or web address] - password [email or web address] [facility]  Office  [phone]  CC: Primary care physician; Loura Pardon, MD  Note: This dictation was prepared with Dragon dictation along with smaller phrase technology. Any transcriptional errors that result from this process are unintentional.

## 2016-02-03 ENCOUNTER — Encounter: Payer: Self-pay | Admitting: Family Medicine

## 2016-02-03 ENCOUNTER — Encounter: Payer: Self-pay | Admitting: Gastroenterology

## 2016-02-03 ENCOUNTER — Ambulatory Visit (INDEPENDENT_AMBULATORY_CARE_PROVIDER_SITE_OTHER): Payer: Medicare Other | Admitting: Family Medicine

## 2016-02-03 VITALS — BP 124/58 | HR 91 | Temp 98.3°F | Ht 68.0 in | Wt 143.2 lb

## 2016-02-03 DIAGNOSIS — R55 Syncope and collapse: Secondary | ICD-10-CM

## 2016-02-03 DIAGNOSIS — C3411 Malignant neoplasm of upper lobe, right bronchus or lung: Secondary | ICD-10-CM

## 2016-02-03 DIAGNOSIS — J449 Chronic obstructive pulmonary disease, unspecified: Secondary | ICD-10-CM

## 2016-02-03 DIAGNOSIS — R1310 Dysphagia, unspecified: Secondary | ICD-10-CM

## 2016-02-03 DIAGNOSIS — R634 Abnormal weight loss: Secondary | ICD-10-CM

## 2016-02-03 DIAGNOSIS — I951 Orthostatic hypotension: Secondary | ICD-10-CM | POA: Diagnosis not present

## 2016-02-03 DIAGNOSIS — Z720 Tobacco use: Secondary | ICD-10-CM | POA: Diagnosis not present

## 2016-02-03 DIAGNOSIS — I5022 Chronic systolic (congestive) heart failure: Secondary | ICD-10-CM

## 2016-02-03 DIAGNOSIS — D649 Anemia, unspecified: Secondary | ICD-10-CM | POA: Diagnosis not present

## 2016-02-03 DIAGNOSIS — E440 Moderate protein-calorie malnutrition: Secondary | ICD-10-CM

## 2016-02-03 DIAGNOSIS — I4892 Unspecified atrial flutter: Secondary | ICD-10-CM

## 2016-02-03 NOTE — Patient Instructions (Addendum)
I'm glad you are feeling a little better  Keep eating soft foods and soups and meal supplements  Drink fluids to prevent dehydration  Blood pressure is improved today -you are not orthostatic Continue the florinef (that raises blood pressure)  Alert [place] if you get more light headed or pass out   Go forward with the barium swallow and GI follow up   See your oncologist as planned   Keep thinking about quitting smoking -let us know if you need help

## 2016-02-03 NOTE — Progress Notes (Signed)
Subjective:    Patient ID: [name], male    DOB: 1940-12-31, 75 y.o.   MRN: [id number]  HPI  75 yo medically complex pt (hx of lung cancer and copd and cad) here for f/u of hospital admission from 11/3 to 01/25/16 Was hosp for orthostatic hypotension/ syncope and collapse and dehydration  He had passed out 3 times the am of admission and was aggressively hydrated in ED Was noted to be orthostatic with bp in 70s upon standing   Had no appetite and dislikes water and c/o dysphagia   Did echo-nl EF Cortisol level nl Given px for florinef and compression stockings  Told to change position slowly   Noted he had lost 50 lb total since beg of cancer tx as well  He sited problems with dysphagia -given some reglan and pepcid  for that and he was to see GI  No longer on any beta blocker or ca channel blocker due to hypotension  BP Readings from Last 3 Encounters:  02/03/16 (!) 124/58  01/30/16 110/60  01/29/16 (!) 120/54    Pulse Readings from Last 3 Encounters:  02/03/16 91  01/30/16 90  01/29/16 88    Overall-no more syncope  Feeling light headed when he stands to move - it only lasts 30 seconds- he takes it very slowly  Now he is drinking much more fluids  Eating soft foods that are easy to swallow and soups  No choking  Appetite is a little improved    He then f/u with cardiology on 01/26/16 Was to contniue on dual anti platelet tx with asa and plavix for multiple stents (also TIA) He was into of statin and did not re start it (now on zetia)  Declines anti coag for a fib/flutter and intol of rate lowering agents   He then saw GI on 11/917 for dysphagia eval/wt loss  Noted he is high risk for procedures  Planned on barium swallow test to start -- that will be 11/22  If needed EGD would require 5 d off of plavix  Also anemic -they are aware  Lab Results  Component Value Date   WBC 7.7 01/30/2016   HGB 9.3 (L) 01/30/2016   HCT 27.6 (L) 01/30/2016   MCV 95.2  01/30/2016   PLT 330 01/30/2016   stopped the reglan because it was not helping anything   Lung cancer status : CT of lungs recent showed interval inc in size of bilateral pulmonary nodules (? [place])  Diffuse thickening of esophagus No mediastinal adenopathy  Has f/u with oncologist Thursday  No change in breathing per pt (copd)   Wt Readings from Last 3 Encounters:  02/03/16 143 lb 4 oz (65 kg)  01/30/16 145 lb (65.8 kg)  01/29/16 145 lb (65.8 kg)    Smoking status - 3/4 ppd  He is getting closer to ready to quit   Patient Active Problem List   Diagnosis Date Noted  . Syncope 01/23/2016  . Left arm weakness 12/19/2015  . Numbness and tingling in left arm 12/19/2015  . Left shoulder pain 12/19/2015  . History of recent fall 12/19/2015  . Paresthesia of arm 12/01/2015  . Depression 12/01/2015  . Malnutrition of moderate degree 11/20/2015  . TIA (transient ischemic attack) 11/19/2015  . Weakness 11/17/2015  . Physical deconditioning 11/10/2015  . Multiple rib fractures 11/10/2015  . Chronic systolic heart failure ([place]) 11/02/2015  . Atrial flutter with rapid ventricular response ([place]) 10/30/2015  . Atrial flutter (  [place]) 10/30/2015  . B12 deficiency 07/22/2015  . Anemia 07/15/2015  . Hematuria 07/08/2015  . Unintentional weight loss 07/08/2015  . Hypotension 07/08/2015  . COPD with emphysema ([place]) 05/22/2015  . Cough 05/22/2015  . Tobacco user 05/22/2015  . Mediastinal lymphadenopathy 01/22/2015  . 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 S/P bronchoscopy with biopsy   . Colon cancer screening 11/06/2014  . Encounter for Medicare annual wellness exam 11/06/2014  . Routine general medical examination at a health care facility 11/06/2014  . Lung nodule 06/28/2014  . Carotid stenosis 12/20/2013  . Aftercare following surgery of the circulatory system 12/20/2013  . Aftercare following surgery of the circulatory system, [place] 12/19/2012  . Personal history of  colonic polyps 08/28/2012  . Hyperglycemia 08/07/2012  . Pre-operative cardiovascular examination 11/03/2011  . Carotid artery disease ([place]) 05/20/2011  . Grief reaction 05/07/2011  . Ischemic cardiomyopathy 01/13/2011  . Palpitations 11/10/2010  . Occlusion and stenosis of carotid artery without mention of cerebral infarction 04/02/2010  . Anxiety disorder 12/12/2009  . HYPERTENSION, BENIGN 09/29/2008  . History of prostate cancer 09/19/2008  . Hyperlipidemia 02/10/2007  . AMAUROSIS FUGAX 02/10/2007  . MYOCARDIAL INFARCTION, HX OF 02/10/2007  . Coronary atherosclerosis 02/10/2007  . PERIPHERAL VASCULAR DISEASE 02/10/2007  . HEMORRHOIDS 02/10/2007  . Chronic obstructive pulmonary disease ([place]) 02/10/2007  . OSTEOARTHRITIS 02/10/2007  . Nicotine dependence 02/10/2007  . Pure hypercholesterolemia 01/27/2007  . CORONARY ATHEROSCLEROSIS NATIVE CORONARY ARTERY 01/27/2007   Past Medical History:  Diagnosis Date  . AAA (abdominal aortic aneurysm) (HCC)    4.2 cm IR AAA noted on 01/13/15 PET scan  . Anxiety   . CAD (coronary artery disease)    AMI 1997 w/ BMS x 2 LAD, BMS CFX 2002, DES x 2 LAD 2009, DES CFX & OM 2012   . Carotid artery disease ([place])    RICA CEA 1610, LICA CEA 9604 per Dr. [name],   . COPD (chronic obstructive pulmonary disease) ([place])   . GERD (gastroesophageal reflux disease)    occ  . HTN (hypertension)   . Hydradenitis   . Hyperlipidemia   . Ischemic cardiomyopathy    EF 40-45 percent at cath 2012  . Myocardial infarction   . Non-small cell lung cancer (NSCLC) ([place]) dx'd 12/2014   SBRT  . Osteoarthritis    pt. denies 01/17/2015  . PAC (premature atrial contraction)   . Peripheral vascular disease ([place])   . Pneumonia   . Prostate cancer ([place]) dx'd approx 2008   xrt throughfoley  . Radiation 02/18/15-02/25/15   right upper lung 54 Gy  . Shortness of breath dyspnea   . Stroke [facility]) 11-24-11   "no feeling in right 5th digit"  . Urinary frequency   . Urinary  urgency    Past Surgical History:  Procedure Laterality Date  . CARDIAC CATHETERIZATION    . CARDIOVASCULAR STRESS TEST  12-2006  . CARDIOVERSION N/A 10/31/2015   Procedure: CARDIOVERSION;  Surgeon: Sueanne Margarita, MD;  Location: [place];  Service: Cardiovascular;  Laterality: N/A;  . CAROTID ENDARTERECTOMY Right 03-2000   CE  . CAROTID ENDARTERECTOMY Left 11-24-11   CE  . COLONOSCOPY  04-2001   polyp  . [place], 2002, 2009, 2012   8 stents total  . [place]   neg  . ENDARTERECTOMY  11/24/2011   Procedure: ENDARTERECTOMY CAROTID;  Surgeon: Rosetta Posner, MD;  Location: [place];  Service: Vascular;  Laterality: Left;  .  EYE SURGERY Bilateral Aug. 17, 2015   Cataract  . FUDUCIAL PLACEMENT N/A 12/25/2014   Procedure: PLACEMENT OF FUDUCIAL;  Surgeon: Collene Gobble, MD;  Location: [place];  Service: Thoracic;  Laterality: N/A;  . PROSTATE SURGERY     Radiation Tx  X's 40  . TEE WITHOUT CARDIOVERSION N/A 10/31/2015   Procedure: TRANSESOPHAGEAL ECHOCARDIOGRAM (TEE);  Surgeon: Sueanne Margarita, MD;  Location[facility] ENDOSCOPY;  Service: Cardiovascular;  Laterality: N/A;  . VASCULAR SURGERY    . VIDEO BRONCHOSCOPY WITH ENDOBRONCHIAL NAVIGATION N/A 12/25/2014   Procedure: VIDEO BRONCHOSCOPY WITH ENDOBRONCHIAL NAVIGATION  with Biopsies and Brushings;  Surgeon: Collene Gobble, MD;  Location: [place];  Service: Thoracic;  Laterality: N/A;  . VIDEO BRONCHOSCOPY WITH ENDOBRONCHIAL ULTRASOUND N/A 01/22/2015   Procedure: VIDEO BRONCHOSCOPY WITH ENDOBRONCHIAL ULTRASOUND with Biopsies;  Surgeon: Collene Gobble, MD;  Location: [place] OR;  Service: Thoracic;  Laterality: N/A;   Social History  Substance Use Topics  . Smoking status: Current Every Day Smoker    Packs/day: 0.50    Years: 55.00    Types: Cigarettes  . Smokeless tobacco: Never Used     Comment: 1/2 ppd (01/07/15)  . Alcohol use 0.0 oz/week     Comment: occ   Family History  Problem Relation Age of Onset    . Stroke Father   . Heart disease Father   . Heart disease Mother     pacemaker  . Other Brother     benign brain tumor  . Cancer Brother     Eye-behind  . Cancer Sister     Breast  . Liver cancer Brother   . Diabetes Maternal Grandmother    Allergies  Allergen Reactions  . Augmentin [Amoxicillin-Pot Clavulanate] Diarrhea, Nausea And Vomiting and Other (See Comments)    Has patient had a PCN reaction causing immediate rash, facial/tongue/throat swelling, SOB or lightheadedness with hypotension: No Has patient had a PCN reaction causing severe rash involving mucus membranes or skin necrosis: No Has patient had a PCN reaction that required hospitalization No Has patient had a PCN reaction occurring within the last 10 years: Yes If all of the above answers are "NO", then may proceed with Cephalosporin use.   . Chantix [Varenicline]     Violent nightmares  . Sulfonamide Derivatives Other (See Comments)    Pt do not remember 8-30 md  . Zantac [Ranitidine Hcl] Diarrhea   Current Outpatient Prescriptions on File Prior to Visit  Medication Sig Dispense Refill  . acetaminophen (TYLENOL) 500 MG tablet Take 1,000 mg by mouth every 4 (four) hours as needed for mild pain, moderate pain, fever or headache.     . albuterol (PROVENTIL HFA;VENTOLIN HFA) 108 (90 Base) MCG/ACT inhaler Inhale 2 puffs into the lungs every 4 (four) hours as needed for wheezing or shortness of breath.    . ALPRAZolam (XANAX) 0.5 MG tablet Take 1 tablet (0.5 mg total) by mouth at bedtime as needed for anxiety or sleep. 30 tablet 0  . aspirin EC 81 MG tablet Take 81 mg by mouth at bedtime.     . clopidogrel (PLAVIX) 75 MG tablet Take 1 tablet (75 mg total) by mouth daily. 30 tablet 11  . ezetimibe (ZETIA) 10 MG tablet TAKE 1 TABLET (10 MG TOTAL) BY MOUTH DAILY. 30 tablet 11  . famotidine (PEPCID) 20 MG tablet Take 1 tablet (20 mg total) by mouth 2 (two) times daily. 60 tablet 0  . fludrocortisone (FLORINEF) 0.1 MG  tablet  Take 1 tablet (0.1 mg total) by mouth daily. 30 tablet 0  . Fluticasone-Salmeterol (ADVAIR DISKUS) 500-50 MCG/DOSE AEPB Inhale 1 puff into the lungs 2 (two) times daily. 3 each 0  . Fluticasone-Salmeterol (ADVAIR DISKUS) 500-50 MCG/DOSE AEPB Inhale 1 puff into the lungs 2 (two) times daily. 14 each 0  . ipratropium-albuterol (DUONEB) 0.5-2.5 (3) MG/3ML SOLN Take 3 mLs by nebulization every 4 (four) hours as needed (shortness of breath and wheezing).    Marland Kitchen loratadine (CLARITIN) 10 MG tablet TAKE 1 TABLET BY MOUTH EVERY DAY 30 tablet 5  . mirtazapine (REMERON) 15 MG tablet Take 1 tablet (15 mg total) by mouth at bedtime. 30 tablet 5  . Multiple Vitamin (MULTIVITAMIN WITH MINERALS) TABS tablet Take 1 tablet by mouth daily.    . nitroGLYCERIN (NITROSTAT) 0.4 MG SL tablet Place 0.4 mg under the tongue every 5 (five) minutes as needed for chest pain.    . Tamsulosin HCl (FLOMAX) 0.4 MG CAPS Take 0.4 mg by mouth at bedtime.     Marland Kitchen tiotropium (SPIRIVA) 18 MCG inhalation capsule Place 18 mcg into inhaler and inhale daily.     No current facility-administered medications on file prior to visit.     Review of Systems    Review of Systems  Constitutional: Negative for fever, appetite change,  and pos for wt loss with better appetite Eyes: Negative for pain and visual disturbance.  Respiratory: Negative for cough and pos for baseline shortness of breath.   Cardiovascular: Negative for cp or palpitations    Gastrointestinal: Negative for nausea, diarrhea and constipation.  Genitourinary: Negative for urgency and frequency.  Skin: Negative for pallor or rash   Neurological: Negative for weakness, numbness and headaches. occ light headedness that is improved  Hematological: Negative for adenopathy. Does not bruise/bleed easily.  Psychiatric/Behavioral: Negative for dysphoric mood. The patient is nervous/anxious.      Objective:   Physical Exam  Constitutional: He appears well-developed and  well-nourished. No distress.  Frail appearing elderly male  HENT:  Head: Normocephalic and atraumatic.  Mouth/Throat: Oropharynx is clear and moist.  MMM Does not appear dehydrated   Eyes: Conjunctivae and EOM are normal. Pupils are equal, round, and reactive to light.  Neck: Normal range of motion. Neck supple. No JVD present. Carotid bruit is not present. No thyromegaly present.  Cardiovascular: Normal rate, normal heart sounds and intact distal pulses.  Exam reveals no gallop.   Rate of 90  Pulmonary/Chest: Effort normal and breath sounds normal. No respiratory distress. He has no wheezes. He has no rales.  No crackles  Diffusely distant bs No wheeze today  Abdominal: Soft. Bowel sounds are normal. He exhibits no distension, no abdominal bruit and no mass. There is no tenderness. There is no rebound and no guarding.  Musculoskeletal: He exhibits no edema or tenderness.  Lymphadenopathy:    He has no cervical adenopathy.  Neurological: He is alert. He has normal reflexes. No cranial nerve deficit. Coordination normal.  Skin: Skin is warm and dry. No rash noted.  Sallow complexion  Nl capillary refill  Psychiatric: He has a normal mood and affect.  Affect is improved today          Assessment & Plan:   Problem List Items Addressed This Visit      Cardiovascular and Mediastinum   Atrial flutter with rapid ventricular response ([place])    No longer tolerates rate controlling agents due to orthostatic hypotension  However-rate continues in the 90s  Not  symptomatic        Chronic systolic heart failure (HCC)    Stable  Rev last cardiology note      Hypotension - Primary    Orthostatic hypotension helped with flornief  Neg orthostatics today  Symptoms improved No further syncope  Re -assuring exam  He can no longer tolerate rate controlling meds for his heart that lower bp  Hospital records/studies/ labs all rev in detail with pt today  Also rev his cardiol and gi  f/u appts Will update [place] if his light headedness worsens Rev imp of fluids to prevent dehydration again  Rev imp of regular meals/ soft to liquid for now given hx of dysphagia       RESOLVED: Syncope    This is resolved with correction of dehydration and orthostatic hypotension  Rev hospital records in detail         Respiratory   Chronic obstructive pulmonary disease ([place])    Stable today  Pt states he will continue to think about smoking cessation      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 If cancer related  He is eating soft and liquid diet w/o problems         Other   Anemia    May be due to chronic dz incl cancer/copd Also malnourished  Being w/u by GI      Malnutrition of moderate degree    Rev soft and liquid meals and supplements with him Wt down 2 lb since hosp Appetite better however      Tobacco user    Disc in detail risks of smoking and possible outcomes including copd, vascular/ heart disease, cancer , respiratory and sinus infections  He already has copd/ CAD and lung cancer Pt voices understanding He states he is not ready to quit now In the future plans to try cold [place] and poss then use patches      Unintentional weight loss    Malnourished  Due to dysphagia has to eat soft or liquid diet however since appetite has returned he is doing much better with this  Barium swallow and EGD planned  ? If rel to his cancer  Wt is down 2 lb from hospitalization

## 2016-02-03 NOTE — Progress Notes (Signed)
Pre visit review using our clinic review tool, if applicable. No additional management support is needed unless otherwise documented below in the visit note. 

## 2016-02-04 DIAGNOSIS — R1310 Dysphagia, unspecified: Secondary | ICD-10-CM | POA: Insufficient documentation

## 2016-02-04 NOTE — Assessment & Plan Note (Signed)
In midst of w/u by GI Barium swallow and egd (if needed) planned  ? If cancer related  He is eating soft and liquid diet w/o problems

## 2016-02-04 NOTE — Assessment & Plan Note (Signed)
Rev soft and liquid meals and supplements with him Wt down 2 lb since hosp Appetite better however

## 2016-02-04 NOTE — Assessment & Plan Note (Signed)
Disc in detail risks of smoking and possible outcomes including copd, vascular/ heart disease, cancer , respiratory and sinus infections  He already has copd/ CAD and lung cancer Pt voices understanding He states he is not ready to quit now In the future plans to try cold [place] and poss then use patches

## 2016-02-04 NOTE — Assessment & Plan Note (Signed)
Malnourished  Due to dysphagia has to eat soft or liquid diet however since appetite has returned he is doing much better with this  Barium swallow and EGD planned  ? If rel to his cancer  Wt is down 2 lb from hospitalization

## 2016-02-04 NOTE — Assessment & Plan Note (Signed)
May be due to chronic dz incl cancer/copd Also malnourished  Being w/u by GI

## 2016-02-04 NOTE — Assessment & Plan Note (Signed)
This is resolved with correction of dehydration and orthostatic hypotension  Rev hospital records in detail

## 2016-02-04 NOTE — Assessment & Plan Note (Signed)
Stable  Rev last cardiology note

## 2016-02-04 NOTE — Assessment & Plan Note (Signed)
For f/u with oncology this week  CT showed inc size of pulmonary nodules He has lost much weight

## 2016-02-04 NOTE — Assessment & Plan Note (Signed)
Orthostatic hypotension helped with flornief  Neg orthostatics today  Symptoms improved No further syncope  Re -assuring exam  He can no longer tolerate rate controlling meds for his heart that lower bp  Hospital records/studies/ labs all rev in detail with pt today  Also rev his cardiol and gi f/u appts Will update [place] if his light headedness worsens Rev imp of fluids to prevent dehydration again  Rev imp of regular meals/ soft to liquid for now given hx of dysphagia

## 2016-02-04 NOTE — Assessment & Plan Note (Signed)
No longer tolerates rate controlling agents due to orthostatic hypotension  However-rate continues in the 90s  Not symptomatic

## 2016-02-04 NOTE — Assessment & Plan Note (Signed)
Stable today  Pt states he will continue to think about smoking cessation

## 2016-02-05 ENCOUNTER — Encounter: Payer: Self-pay | Admitting: Radiation Oncology

## 2016-02-05 ENCOUNTER — Ambulatory Visit
Admission: RE | Admit: 2016-02-05 | Discharge: 2016-02-05 | Disposition: A | Discharge status: Home / Self Care | Payer: Medicare Other | Source: Ambulatory Visit | Attending: Radiation Oncology | Admitting: Radiation Oncology

## 2016-02-05 VITALS — BP 119/73 | HR 88 | Temp 97.6°F | Ht 68.0 in | Wt 141.4 lb

## 2016-02-05 DIAGNOSIS — R918 Other nonspecific abnormal finding of lung field: Secondary | ICD-10-CM | POA: Diagnosis not present

## 2016-02-05 DIAGNOSIS — Z923 Personal history of irradiation: Secondary | ICD-10-CM | POA: Insufficient documentation

## 2016-02-05 DIAGNOSIS — E860 Dehydration: Secondary | ICD-10-CM | POA: Insufficient documentation

## 2016-02-05 DIAGNOSIS — C7801 Secondary malignant neoplasm of right lung: Secondary | ICD-10-CM | POA: Diagnosis not present

## 2016-02-05 DIAGNOSIS — R1310 Dysphagia, unspecified: Secondary | ICD-10-CM | POA: Diagnosis not present

## 2016-02-05 DIAGNOSIS — C3411 Malignant neoplasm of upper lobe, right bronchus or lung: Secondary | ICD-10-CM | POA: Diagnosis not present

## 2016-02-05 NOTE — Progress Notes (Signed)
   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 He reports difficulty swallowing. He reports food is getting stuck in his throat. He states it even hurts to swallow water. He is scheduled for a barium swallow test and EGD on 02/17/16. Patient reports an occasional productive cough with clear sputum. He denies shortness of breath, hemoptysis, or fatigue. Of note, patient continues to smoke 0.75 ppd. He also stated administration to the hospital 2 weeks ago because he passed out 3 times due to dehydration.  Physical Exam:  Vitals:   02/05/16 0848  BP: 119/73  Pulse: 88  Temp: 97.6 F (36.4 C)  TempSrc: Oral  SpO2: 100%  Weight: 141 lb 6.4 oz (64.1 kg)  Height: '5\' 8"'$  (1.727 m)  Lungs are clear to auscultation bilaterally. Heart has regular rate and rhythm. No palpable cervical, supraclavicular, or axillary adenopathy. Abdomen soft, non-tender, normal bowel sounds.   IMPRESSION: [name] is a 75 y.o. male s/p SBRT. Clinically stable. Recent chest CT questions bilateral pulmonary metastasis. I reviewed patient' s previous scans and he had small nodules in both lungs prior to previous scan. The area that was treated with SBRT by Dr. Pablo Ledger has increased slightly in size and appears more solid on the scan. But only 1-1m change in size. I discussed these findings with the patient and will schedule a chest CT in 3 months.  PLAN:    I will recommend a repeat chest CT in three months, and will follow up soon after.    This document serves as a record of  services personally performed by JGery Pray MD. It was created on his behalf by LBethann Humble a trained medical scribe. The creation of this record is based on the scribe's personal observations and the provider's statements to them. This document has been checked and approved by the attending provider.

## 2016-02-05 NOTE — Progress Notes (Signed)
[name] is here for follow up.  He reports that he is having trouble swallowing. He said that food is getting stuck in his throat. He is scheduled for a barium swallow test and EGD on 02/17/16.  He denies feeling short of breath.  He reports having an occasional productive cough with clear sputum.  He denies having hemoptysis.  He denies having fatigue.  He continues to smoke 3/4 pack of cigarettes per day.  He mentioned that he was in the hospital 2 weeks ago because he passed out 3 times.  He said it was from dehydration.  BP 119/73 (BP Location: Right Arm, Patient Position: Sitting)   Pulse 88   Temp 97.6 F (36.4 C) (Oral)   Ht '5\' 8"'$  (1.727 m)   Wt 141 lb 6.4 oz (64.1 kg)   SpO2 100%   BMI 21.50 kg/m    Wt Readings from Last 3 Encounters:  02/05/16 141 lb 6.4 oz (64.1 kg)  02/03/16 143 lb 4 oz (65 kg)  01/30/16 145 lb (65.8 kg)

## 2016-02-11 ENCOUNTER — Ambulatory Visit (HOSPITAL_COMMUNITY)
Admission: RE | Admit: 2016-02-11 | Discharge: 2016-02-11 | Disposition: A | Discharge status: Home / Self Care | Payer: Medicare Other | Source: Ambulatory Visit | Attending: Nurse Practitioner | Admitting: Nurse Practitioner

## 2016-02-11 DIAGNOSIS — K228 Other specified diseases of esophagus: Secondary | ICD-10-CM | POA: Insufficient documentation

## 2016-02-11 DIAGNOSIS — R1310 Dysphagia, unspecified: Secondary | ICD-10-CM | POA: Insufficient documentation

## 2016-02-11 DIAGNOSIS — K222 Esophageal obstruction: Secondary | ICD-10-CM | POA: Insufficient documentation

## 2016-02-15 ENCOUNTER — Other Ambulatory Visit: Payer: Self-pay | Admitting: Emergency Medicine

## 2016-02-16 ENCOUNTER — Telehealth: Payer: Self-pay | Admitting: Family Medicine

## 2016-02-16 NOTE — Telephone Encounter (Signed)
I can see the forms were completed and mailed back to the clinic that does the medication assistance(scanned in EPIC). I called pt's daughter and no answer so I left a voicemail and gave her the phone # on the forms [phone] and advise her on the VM to f/u with them

## 2016-02-16 NOTE — Telephone Encounter (Signed)
Pt daughter, Magda Paganini, called to get status on financial assistance for meds. Pt told Magda Paganini the ppw was sent to Dr for approval months ago. She is trying to find out what to do at this point. He is on a fixed income and does need assist. She is requesting a cb.

## 2016-02-17 ENCOUNTER — Ambulatory Visit (AMBULATORY_SURGERY_CENTER): Payer: Medicare Other | Admitting: Gastroenterology

## 2016-02-17 ENCOUNTER — Encounter: Payer: Self-pay | Admitting: Gastroenterology

## 2016-02-17 ENCOUNTER — Telehealth: Payer: Self-pay | Admitting: Emergency Medicine

## 2016-02-17 ENCOUNTER — Telehealth: Payer: Self-pay

## 2016-02-17 ENCOUNTER — Other Ambulatory Visit: Payer: Self-pay

## 2016-02-17 VITALS — BP 148/74 | HR 82 | Temp 97.1°F | Resp 15 | Ht 68.0 in | Wt 145.0 lb

## 2016-02-17 DIAGNOSIS — R933 Abnormal findings on diagnostic imaging of other parts of digestive tract: Secondary | ICD-10-CM | POA: Diagnosis not present

## 2016-02-17 DIAGNOSIS — R1310 Dysphagia, unspecified: Secondary | ICD-10-CM

## 2016-02-17 DIAGNOSIS — R1319 Other dysphagia: Secondary | ICD-10-CM

## 2016-02-17 DIAGNOSIS — C155 Malignant neoplasm of lower third of esophagus: Secondary | ICD-10-CM | POA: Diagnosis not present

## 2016-02-17 MED ORDER — OMEPRAZOLE 40 MG PO CPDR: DELAYED_RELEASE_CAPSULE | ORAL | 11 refills | Status: DC

## 2016-02-17 MED ORDER — FLUTICASONE-SALMETEROL 500-50 MCG/DOSE IN AEPB: 1.0000 | INHALATION_SPRAY | Freq: Two times a day (BID) | RESPIRATORY_TRACT | 0 refills | Status: DC

## 2016-02-17 MED ORDER — SODIUM CHLORIDE 0.9 % IV SOLN: 500.0000 mL | INTRAVENOUS | Status: DC

## 2016-02-17 MED ORDER — LIDOCAINE VISCOUS 2 % MT SOLN: OROMUCOSAL | 1 refills | Status: DC

## 2016-02-17 NOTE — Progress Notes (Signed)
Called to room to assist during endoscopic procedure.  Patient ID and intended procedure confirmed with present staff. Received instructions for my participation in the procedure from the performing physician.  

## 2016-02-17 NOTE — Telephone Encounter (Signed)
error 

## 2016-02-17 NOTE — Progress Notes (Signed)
Report given to PACU RN, vss 

## 2016-02-17 NOTE — Patient Instructions (Signed)
YOU HAD AN ENDOSCOPIC PROCEDURE TODAY AT [facility]:   Refer to the procedure report that was given to you for any specific questions about what was found during the examination.  If the procedure report does not answer your questions, please call your gastroenterologist to clarify.  If you requested that your care partner not be given the details of your procedure findings, then the procedure report has been included in a sealed envelope for you to review at your convenience later.  YOU SHOULD EXPECT: Some feelings of bloating in the abdomen. Passage of more gas than usual.  Walking can help get rid of the air that was put into your GI tract during the procedure and reduce the bloating. If you had a lower endoscopy (such as a colonoscopy or flexible sigmoidoscopy) you may notice spotting of blood in your stool or on the toilet paper. If you underwent a bowel prep for your procedure, you may not have a normal bowel movement for a few days.  Please Note:  You might notice some irritation and congestion in your nose or some drainage.  This is from the oxygen used during your procedure.  There is no need for concern and it should clear up in a day or so.  SYMPTOMS TO REPORT IMMEDIATELY:   Following lower endoscopy (colonoscopy or flexible sigmoidoscopy):  Excessive amounts of blood in the stool  Significant tenderness or worsening of abdominal pains  Swelling of the abdomen that is new, acute  Fever of 100F or higher   Following upper endoscopy (EGD)  Vomiting of blood or coffee ground material  New chest pain or pain under the shoulder blades  Painful or persistently difficult swallowing  New shortness of breath  Fever of 100F or higher  Black, tarry-looking stools  For urgent or emergent issues, a gastroenterologist can be reached at any hour by calling [phone].   DIET:  Full liquid to a soft diet.  Drink plenty of fluids but you should avoid alcoholic beverages  for 24 hours.  ACTIVITY:  You should plan to take it easy for the rest of today and you should NOT DRIVE or use heavy machinery until tomorrow (because of the sedation medicines used during the test).    FOLLOW UP: Our staff will call the number listed on your records the next business day following your procedure to check on you and address any questions or concerns that you may have regarding the information given to you following your procedure. If we do not reach you, we will leave a message.  However, if you are feeling well and you are not experiencing any problems, there is no need to return our call.  We will assume that you have returned to your regular daily activities without incident.  If any biopsies were taken you will be contacted by phone or by letter within the next 1-3 weeks.  Please call us at [phone] if you have not heard about the biopsies in 3 weeks.    SIGNATURES/CONFIDENTIALITY: You and/or your care partner have signed paperwork which will be entered into your electronic medical record.  These signatures attest to the fact that that the information above on your After Visit Summary has been reviewed and is understood.  Full responsibility of the confidentiality of this discharge information lies with you and/or your care-partner.   Handouts were given to your care partner on  Resume plavix at prior dose tomorrow. No aspirin, ibuprofen, naproxen, or other  non-steroidal anti-inflammatory drugs. You may resume your other current medications today. Await biopsy results. New rx was sent to your pharmacy for Prilosec and viscous lidocaine. Follow up in the GI office in 2 weeks with Tye Savoy, NP. Please call if any questions or concerns.

## 2016-02-17 NOTE — Telephone Encounter (Signed)
Pt aware that samples of advair has been left upfront for pick up. I also made pt aware that we do not have any samples of spiriva samples. Pt aware & voiced understanding. Nothing further needed.

## 2016-02-17 NOTE — Progress Notes (Signed)
No problems noted in the recovery room. maw 

## 2016-02-17 NOTE — Progress Notes (Signed)
Pathology sent rush per D.O.

## 2016-02-17 NOTE — Op Note (Signed)
[place] Patient Name: [name] Procedure Date: 02/17/2016 3:27 PM MRN: [id number] Endoscopist: Ladene Artist , MD Age: 75 Referring MD:  Date of Birth: 1941-01-22 Gender: Male Account #: [id number] Procedure:                Upper GI endoscopy Indications:              Dysphagia, Odynophagia, Abnormal esophagram,                            Abnormal CT of the GI tract-esophagus Medicines:                Monitored Anesthesia Care Procedure:                Pre-Anesthesia Assessment:                           - Prior to the procedure, a History and Physical                            was performed, and patient medications and                            allergies were reviewed. The patient's tolerance of                            previous anesthesia was also reviewed. The risks                            and benefits of the procedure and the sedation                            options and risks were discussed with the patient.                            All questions were answered, and informed consent                            was obtained. Prior Anticoagulants: The patient has                            taken Plavix (clopidogrel), last dose was 5 days                            prior to procedure. ASA Grade Assessment: III - A                            patient with severe systemic disease. After                            reviewing the risks and benefits, the patient was                            deemed in satisfactory condition to undergo the  procedure.                           After obtaining informed consent, the endoscope was                            passed under direct vision. Throughout the                            procedure, the patient's blood pressure, pulse, and                            oxygen saturations were monitored continuously. The                            Model [id number]-[id number]) scope was introduced                        through the mouth, and advanced to the second part                            of duodenum. The upper GI endoscopy was                            accomplished without difficulty. The patient                            tolerated the procedure well. Scope In: Scope Out: Findings:                 LA Grade D (one or more mucosal breaks involving at                            least 75% of esophageal circumference) esophagitis                            with bleeding was found 32 cm to 38 cm from the                            incisors. Biopsies were taken with a cold forceps                            for histology.                           A medium-sized, fungating mass with bleeding and                            stigmata of recent bleeding was found in the distal                            esophagus, 38 cm from the incisors. The mass was                            very friable, soft, partially obstructing and  not                            circumferential. Biopsies were taken with a cold                            forceps for histology.                           The proximal esophagus was normal.                           A small hiatal hernia was present.                           The exam of the stomach was otherwise normal.                           The duodenal bulb and second portion of the                            duodenum were normal. Complications:            No immediate complications. Estimated Blood Loss:     Estimated blood loss was minimal. Impression:               - LA Grade D esophagitis. Biopsied.                           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                            Biopsied.                           - Normal proximal esophagus.                           - Small hiatal hernia.                           - Normal duodenal bulb and second  portion of the                            duodenum. Recommendation:           - Patient has a contact number available for                            emergencies. The signs and symptoms of potential                            delayed complications were discussed with the  patient. Return to normal activities tomorrow.                            Written discharge instructions were provided to the                            patient.                           - Full liquids to soft diet.                           - Continue present medications.                           - Resume Plavix (clopidogrel) at prior dose                            tomorrow. Refer to managing physician for further                            adjustment of therapy.                           - Await pathology results.                           - No aspirin, ibuprofen, naproxen, or other                            non-steroidal anti-inflammatory drugs.                           - Prilosec (omeprazole) 40 mg PO BID, 1 year of                            refills.                           - Viscous Lidocaine at 2% 10 mL PO q 4 hrs ac and                            prn, 1 refill.                           - Return to GI office in 2 weeks with me or Tye Savoy, NP. Ladene Artist, MD 02/17/2016 3:58:45 PM This report has been signed electronically.

## 2016-02-18 ENCOUNTER — Telehealth: Payer: Self-pay | Admitting: Gastroenterology

## 2016-02-18 ENCOUNTER — Telehealth: Payer: Self-pay | Admitting: *Deleted

## 2016-02-18 NOTE — Telephone Encounter (Signed)
Message left

## 2016-02-19 ENCOUNTER — Telehealth: Payer: Self-pay | Admitting: Family Medicine

## 2016-02-19 ENCOUNTER — Other Ambulatory Visit: Payer: Self-pay

## 2016-02-19 ENCOUNTER — Telehealth: Payer: Self-pay

## 2016-02-19 DIAGNOSIS — C159 Malignant neoplasm of esophagus, unspecified: Secondary | ICD-10-CM

## 2016-02-19 NOTE — Telephone Encounter (Signed)
  Follow up Call-  Call back number 02/17/2016 08/11/2015  Post procedure Call Back phone  # [phone]  Permission to leave phone message Yes Yes  Some recent data might be hidden     Patient questions:  Do you have a fever, pain , or abdominal swelling? No. Pain Score  0 *  Have you tolerated food without any problems? Yes.    Have you been able to return to your normal activities? Yes.    Do you have any questions about your discharge instructions: Diet   No. Medications  No. Follow up visit  No.  Do you have questions or concerns about your Care? No.  Actions: * If pain score is 4 or above: No action needed, pain <4.

## 2016-02-19 NOTE — Telephone Encounter (Signed)
Pt called to a prescription request from his [facility] prescription managament.  The pt did not leave the name of the med or any additional info

## 2016-02-20 ENCOUNTER — Telehealth: Payer: Self-pay | Admitting: *Deleted

## 2016-02-20 DIAGNOSIS — C155 Malignant neoplasm of lower third of esophagus: Secondary | ICD-10-CM

## 2016-02-20 MED ORDER — MIRTAZAPINE 15 MG PO TABS: 15.0000 mg | ORAL_TABLET | Freq: Every day | ORAL | 3 refills | Status: DC

## 2016-02-20 NOTE — Telephone Encounter (Signed)
The only other drug on the list that looks like comes from me is Mirtazapine  I printed it  In IN box to fax The rest look like they come from specialists

## 2016-02-20 NOTE — Telephone Encounter (Signed)
  Oncology Nurse Navigator Documentation  Navigator Location: [facility] (02/20/16 1116) Referral date to RadOnc/MedOnc: 02/19/16 (02/20/16 1116) )Navigator Encounter Type: Introductory phone call (02/20/16 1116)   Abnormal Finding Date: 02/11/16 (02/20/16 1116) Confirmed Diagnosis Date: 02/17/16 (02/20/16 1116)  Spoke with patient and provided new patient appointment for 02/24/16 at 2 pm with Dr. Benay Spice. Also sent message and entered referral for Dr. Sondra Come in radiation oncology to see him since he treated his lung cancer in 2016. Informed of location of [facility], valet service, and registration process. Reminded to bring insurance cards and a current medication list, including supplements. Patient verbalizes understanding.

## 2016-02-20 NOTE — Telephone Encounter (Signed)
Called pt and he said that the medication assistance program told him that they only received at Rx for his nitroglycerin and his albuterol, pt would need paper Rxs for the rest of the meds Dr. Glori Bickers prescribes and he would need the faxed to the medication assistance program, if you are okay please print out all meds you prescribe so I can fax them  (they are closed today so it can wait until Monday if needed)

## 2016-02-23 ENCOUNTER — Ambulatory Visit (INDEPENDENT_AMBULATORY_CARE_PROVIDER_SITE_OTHER)
Admission: RE | Admit: 2016-02-23 | Discharge: 2016-02-23 | Disposition: A | Discharge status: Home / Self Care | Payer: Medicare Other | Source: Ambulatory Visit | Attending: Gastroenterology | Admitting: Gastroenterology

## 2016-02-23 DIAGNOSIS — C159 Malignant neoplasm of esophagus, unspecified: Secondary | ICD-10-CM

## 2016-02-23 DIAGNOSIS — C7951 Secondary malignant neoplasm of bone: Secondary | ICD-10-CM | POA: Diagnosis not present

## 2016-02-23 MED ORDER — IOPAMIDOL (ISOVUE-300) INJECTION 61%
100.0000 mL | Freq: Once | INTRAVENOUS | Status: AC | PRN
  Administered 2016-02-23: 100 mL via INTRAVENOUS

## 2016-02-23 NOTE — Progress Notes (Signed)
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  Biopsies revealed:   02/17/16   Tobacco/Marijuana/Snuff/ETOH use: current smoker - 1 ppd for 55 years.  Occasional ETOH use.  Denies marijuana, snuff use.  Past/Anticipated interventions by cardiothoracic surgery, if any: no  Past/Anticipated interventions by medical oncology, if any: will see Dr. Benay Spice on 02/24/16.  Signs/Symptoms  Weight changes, if any: yes reports he has lost about 40 lbs in last 8 months.  Respiratory complaints, if any: has occasional cough  Hemoptysis, if any: no  Pain issues, if any:  no  SAFETY ISSUES:  Prior radiation?02/18/15-02/25/15 right upper lung 54 Gy   Pacemaker/ICD? no   Possible current pregnancy?no  Is the patient on methotrexate? no   Current Complaints / other details:  Dr. Benay Spice is scheduling a biopsy of the lytic lesion in his left iliac bone.  Patient is here with his daughter.  Vitals were taken in Dr. Gearldine Shown office today.

## 2016-02-23 NOTE — Telephone Encounter (Signed)
Left voicemail letting pt know Rx faxed to medication clinic and left their fax # for pt so he can call his specialist and have them fax over the Rxs they prescribe

## 2016-02-24 ENCOUNTER — Ambulatory Visit (HOSPITAL_BASED_OUTPATIENT_CLINIC_OR_DEPARTMENT_OTHER): Payer: Medicare Other | Admitting: Oncology

## 2016-02-24 ENCOUNTER — Telehealth: Payer: Self-pay | Admitting: Emergency Medicine

## 2016-02-24 ENCOUNTER — Encounter: Payer: Self-pay | Admitting: *Deleted

## 2016-02-24 ENCOUNTER — Ambulatory Visit
Admission: RE | Admit: 2016-02-24 | Discharge: 2016-02-24 | Disposition: A | Discharge status: Home / Self Care | Payer: Medicare Other | Source: Ambulatory Visit | Attending: Radiation Oncology | Admitting: Radiation Oncology

## 2016-02-24 VITALS — BP 125/62 | HR 94 | Temp 97.7°F | Resp 18 | Ht 68.0 in | Wt 138.2 lb

## 2016-02-24 DIAGNOSIS — C155 Malignant neoplasm of lower third of esophagus: Secondary | ICD-10-CM

## 2016-02-24 DIAGNOSIS — E785 Hyperlipidemia, unspecified: Secondary | ICD-10-CM | POA: Diagnosis not present

## 2016-02-24 DIAGNOSIS — I252 Old myocardial infarction: Secondary | ICD-10-CM | POA: Diagnosis not present

## 2016-02-24 DIAGNOSIS — Z923 Personal history of irradiation: Secondary | ICD-10-CM | POA: Insufficient documentation

## 2016-02-24 DIAGNOSIS — F419 Anxiety disorder, unspecified: Secondary | ICD-10-CM | POA: Diagnosis not present

## 2016-02-24 DIAGNOSIS — Z8546 Personal history of malignant neoplasm of prostate: Secondary | ICD-10-CM | POA: Insufficient documentation

## 2016-02-24 DIAGNOSIS — C61 Malignant neoplasm of prostate: Secondary | ICD-10-CM | POA: Diagnosis not present

## 2016-02-24 DIAGNOSIS — J449 Chronic obstructive pulmonary disease, unspecified: Secondary | ICD-10-CM | POA: Insufficient documentation

## 2016-02-24 DIAGNOSIS — D649 Anemia, unspecified: Secondary | ICD-10-CM

## 2016-02-24 DIAGNOSIS — Z800 Family history of malignant neoplasm of digestive organs: Secondary | ICD-10-CM | POA: Diagnosis not present

## 2016-02-24 DIAGNOSIS — M1990 Unspecified osteoarthritis, unspecified site: Secondary | ICD-10-CM | POA: Diagnosis not present

## 2016-02-24 DIAGNOSIS — Z823 Family history of stroke: Secondary | ICD-10-CM | POA: Diagnosis not present

## 2016-02-24 DIAGNOSIS — Z8249 Family history of ischemic heart disease and other diseases of the circulatory system: Secondary | ICD-10-CM | POA: Insufficient documentation

## 2016-02-24 DIAGNOSIS — F17210 Nicotine dependence, cigarettes, uncomplicated: Secondary | ICD-10-CM | POA: Insufficient documentation

## 2016-02-24 DIAGNOSIS — I2510 Atherosclerotic heart disease of native coronary artery without angina pectoris: Secondary | ICD-10-CM | POA: Insufficient documentation

## 2016-02-24 DIAGNOSIS — Z880 Allergy status to penicillin: Secondary | ICD-10-CM | POA: Insufficient documentation

## 2016-02-24 DIAGNOSIS — I739 Peripheral vascular disease, unspecified: Secondary | ICD-10-CM | POA: Insufficient documentation

## 2016-02-24 DIAGNOSIS — Z7951 Long term (current) use of inhaled steroids: Secondary | ICD-10-CM | POA: Insufficient documentation

## 2016-02-24 DIAGNOSIS — R1310 Dysphagia, unspecified: Secondary | ICD-10-CM | POA: Diagnosis not present

## 2016-02-24 DIAGNOSIS — C159 Malignant neoplasm of esophagus, unspecified: Secondary | ICD-10-CM

## 2016-02-24 DIAGNOSIS — Z888 Allergy status to other drugs, medicaments and biological substances status: Secondary | ICD-10-CM | POA: Insufficient documentation

## 2016-02-24 DIAGNOSIS — Z882 Allergy status to sulfonamides status: Secondary | ICD-10-CM | POA: Diagnosis not present

## 2016-02-24 DIAGNOSIS — Z510 Encounter for antineoplastic radiation therapy: Secondary | ICD-10-CM | POA: Insufficient documentation

## 2016-02-24 DIAGNOSIS — K219 Gastro-esophageal reflux disease without esophagitis: Secondary | ICD-10-CM | POA: Diagnosis not present

## 2016-02-24 DIAGNOSIS — Z8673 Personal history of transient ischemic attack (TIA), and cerebral infarction without residual deficits: Secondary | ICD-10-CM | POA: Diagnosis not present

## 2016-02-24 DIAGNOSIS — I255 Ischemic cardiomyopathy: Secondary | ICD-10-CM | POA: Insufficient documentation

## 2016-02-24 DIAGNOSIS — C7951 Secondary malignant neoplasm of bone: Secondary | ICD-10-CM | POA: Insufficient documentation

## 2016-02-24 DIAGNOSIS — Z7902 Long term (current) use of antithrombotics/antiplatelets: Secondary | ICD-10-CM | POA: Insufficient documentation

## 2016-02-24 DIAGNOSIS — C3491 Malignant neoplasm of unspecified part of right bronchus or lung: Secondary | ICD-10-CM

## 2016-02-24 DIAGNOSIS — I714 Abdominal aortic aneurysm, without rupture: Secondary | ICD-10-CM | POA: Insufficient documentation

## 2016-02-24 DIAGNOSIS — I10 Essential (primary) hypertension: Secondary | ICD-10-CM | POA: Insufficient documentation

## 2016-02-24 DIAGNOSIS — Z79899 Other long term (current) drug therapy: Secondary | ICD-10-CM | POA: Insufficient documentation

## 2016-02-24 MED ORDER — TIOTROPIUM BROMIDE MONOHYDRATE 18 MCG IN CAPS: 18.0000 ug | ORAL_CAPSULE | Freq: Every day | RESPIRATORY_TRACT | 5 refills | Status: AC

## 2016-02-24 MED ORDER — ALBUTEROL SULFATE HFA 108 (90 BASE) MCG/ACT IN AERS: 2.0000 | INHALATION_SPRAY | RESPIRATORY_TRACT | 5 refills | Status: DC | PRN

## 2016-02-24 MED ORDER — FLUTICASONE-SALMETEROL 500-50 MCG/DOSE IN AEPB: 1.0000 | INHALATION_SPRAY | Freq: Two times a day (BID) | RESPIRATORY_TRACT | 5 refills | Status: DC

## 2016-02-24 NOTE — Progress Notes (Signed)
Oncology Nurse Navigator Documentation  Oncology Nurse Navigator Flowsheets 02/24/2016  Navigator Location [facility]  Referral date to RadOnc/MedOnc -  Navigator Encounter Type Initial MedOnc  Abnormal Finding Date -  Confirmed Diagnosis Date -  Patient Visit Type MedOnc;Inpatient  Treatment Phase Pre-Tx/Tx Discussion  Barriers/Navigation Needs Coordination of Care;Education  Education Pain/ Symptom Management;Newly Diagnosed Cancer Education  Interventions Education;Coordination of Care;Referrals  Referrals Nutrition/dietician  Coordination of Care Other--escorted patient to Dr. Clabe Seal appointment. Will f/u with radiology tomorrow regarding biopsy  Education Method Verbal;Written;Teach-back  Support Groups/Services GI Support Group;Other--[facility] support services. Patient declined ACS referral   Acuity Level 2  Time Spent with Patient 94  Met with patient and his step-daughter, Magda Paganini during new patient visit. Explained the role of the GI Nurse Navigator and provided New Patient Packet with information on: 1. Esophgeal cancer 2. Support groups 3. Advanced Directives 4. Fall Safety Plan Answered questions, reviewed current treatment plan using TEACH back and provided emotional support. Provided copy of current treatment plan. Encouraged him to increase his fluid intake and start MiraLax every day for his constipation. Will make dietician aware of need to see him. He will be holding his Plavix starting tonight in preparation for biopsy of left iliac lytic lesion. Dr. Benay Spice will call radiology to request priority review of case so it can be scheduled. Mr. Lysaght lives alone, but his step-daughter, Magda Paganini lives next door. He is independent and drives himself for most occasions. He ambulates w/cane and has H/O falling and was in hospital in June for a fall and fractured 5 ribs and perforated his lung in fall. Dr. Benay Spice did make him aware that if the lytic lesion is cancer, he would  not be able to be cured, but will strive for control of his disease and improve his swallowing. He has a cancer policy as well and daughter will fax forms to office tomorrow.   Merceda Elks, RN, BSN GI Oncology [place]

## 2016-02-24 NOTE — Progress Notes (Signed)
[facility] Patient Consult   Referring MD: Graeson Nouri 75 y.o.  03-Jan-1941    Reason for Referral: Esophagus cancer   HPI: [name] has a complex medical history including a history of non-small cell lung cancer, stage IA treated with SRS in December 2016. He also has a history of prostate cancer treated with external beam radiation in 2008. He reports a 4 month history of solid and liquid odynophagia with an associated 40 pound weight loss. He saw Dr. Fuller Plan and was taken to an upper endoscopy on 02/17/2016. A mass with bleeding was found at 38 cm from the incisors. A biopsy ([id number]) confirmed adenocarcinoma with focal signet ring features in a background of Barrett's esophagus with high-grade dysplasia.  He was referred for CTs of the abdomen and pelvis on 02/23/2016. There is a small right pleural effusion. There is distal esophageal wall thickening. Several nonspecific nodules in the lung bases including a 1.7 cm lesion at the left base that was present on a chest CT 02/11/2016. Tiny hypodensities in the liver are too small to characterize. Gastrohepatic nodes measure up to 1 cm. New 1.7 cm aortocaval node. Old right rib fractures. Destructive lesion at the left iliac measuring 3.8 cm with soft tissue extension. New 3 mm lytic lesion in the right iliac. Faint areas of sclerosis in the thoracic or lumbar spine concerning for metastases.  He is scheduled to see Dr.Kinard later today.   A CT of the chest 01/30/2016 revealed an increase in the size of bilateral pulmonary nodules compared to a CT from August 2017.   Past Medical History:  Diagnosis Date  . AAA (abdominal aortic aneurysm) (HCC)    4.2 cm IR AAA noted on 01/13/15 PET scan  . Anxiety   . CAD (coronary artery disease)    AMI 1997 w/ BMS x 2 LAD, BMS CFX 2002, DES x 2 LAD 2009, DES CFX & OM 2012   . Carotid artery disease ([place])    RICA CEA 1601, LICA CEA 0932 per Dr. [name],     . COPD (chronic obstructive pulmonary disease) ([place])   . GERD (gastroesophageal reflux disease)    occ  . HTN (hypertension)   . Hydradenitis   . Hyperlipidemia   . Ischemic cardiomyopathy    EF 40-45 percent at cath 2012  . Myocardial infarction   . Non-small cell lung cancer (NSCLC) ([place]) dx'd 12/2014   SBRT  . Fall with multiple rib fractures and a "perforated lung "-treated at [facility]  July 2017      . PAC (premature atrial contraction)   . Peripheral vascular disease ([place])   . Pneumonia   . Prostate cancer ([place]) dx'd approx 2008   xrt throughfoley  . Radiation 02/18/15-02/25/15   right upper lung 54 Gy  . Shortness of breath dyspnea   . Stroke [facility]) 11-24-11   "no feeling in right 5th digit"  . Urinary frequency   . Urinary urgency     Past Surgical History:  Procedure Laterality Date  . CARDIAC CATHETERIZATION    . CARDIOVASCULAR STRESS TEST  12-2006  . CARDIOVERSION N/A 10/31/2015   Procedure: CARDIOVERSION;  Surgeon: Sueanne Margarita, MD;  Location: [facility];  Service: Cardiovascular;  Laterality: N/A;  . CAROTID ENDARTERECTOMY Right 03-2000   CE  . CAROTID ENDARTERECTOMY Left 11-24-11   CE  . COLONOSCOPY  04-2001   polyp  . [place], 2002, 2009, 2012  8 stents total  . DOPPLER ECHOCARDIOGRAPHY  1995   neg  . ENDARTERECTOMY  11/24/2011   Procedure: ENDARTERECTOMY CAROTID;  Surgeon: Rosetta Posner, MD;  Location: [facility];  Service: Vascular;  Laterality: Left;  . EYE SURGERY Bilateral Aug. 17, 2015   Cataract  . FUDUCIAL PLACEMENT N/A 12/25/2014   Procedure: PLACEMENT OF FUDUCIAL;  Surgeon: Collene Gobble, MD;  Location: [facility];  Service: Thoracic;  Laterality: N/A;  . PROSTATE SURGERY     Radiation Tx  X's 40  . TEE WITHOUT CARDIOVERSION N/A 10/31/2015   Procedure: TRANSESOPHAGEAL ECHOCARDIOGRAM (TEE);  Surgeon: Sueanne Margarita, MD;  Location: [facility] ENDOSCOPY;  Service: Cardiovascular;  Laterality: N/A;  . VASCULAR SURGERY    . VIDEO BRONCHOSCOPY WITH  ENDOBRONCHIAL NAVIGATION N/A 12/25/2014   Procedure: VIDEO BRONCHOSCOPY WITH ENDOBRONCHIAL NAVIGATION  with Biopsies and Brushings;  Surgeon: Collene Gobble, MD;  Location: [facility];  Service: Thoracic;  Laterality: N/A;  . VIDEO BRONCHOSCOPY WITH ENDOBRONCHIAL ULTRASOUND N/A 01/22/2015   Procedure: VIDEO BRONCHOSCOPY WITH ENDOBRONCHIAL ULTRASOUND with Biopsies;  Surgeon: Collene Gobble, MD;  Location: [facility] OR;  Service: Thoracic;  Laterality: N/A;    Medications: Reviewed  Allergies:  Allergies  Allergen Reactions  . Augmentin [Amoxicillin-Pot Clavulanate] Diarrhea, Nausea And Vomiting and Other (See Comments)    Has patient had a PCN reaction causing immediate rash, facial/tongue/throat swelling, SOB or lightheadedness with hypotension: No Has patient had a PCN reaction causing severe rash involving mucus membranes or skin necrosis: No Has patient had a PCN reaction that required hospitalization No Has patient had a PCN reaction occurring within the last 10 years: Yes If all of the above answers are "NO", then may proceed with Cephalosporin use.   . Chantix [Varenicline]     Violent nightmares  . Sulfonamide Derivatives Other (See Comments)    Pt do not remember 8-30 md  . Zantac [Ranitidine Hcl] Diarrhea    Family history: A brother had "eye cancer ". Another brother had a brain tumor. No other family history of cancer.   Social History: He lives alone in [place]. He previously worked Scientist, forensic. He smokes cigarettes. He drinks I'll call on rare social occasions. No transfusion history. No risk factor for HIV or hepatitis.  ROS:   Positives include:Anorexia, 30 pound weight loss, pain with swallowing liquids or solids, constipation, left hand numbness since July 2017  A complete ROS was otherwise negative.  Physical Exam:  Blood pressure 125/62, pulse 94, temperature 97.7 F (36.5 C), temperature source Oral, resp. rate 18, height '5\' 8"'$  (1.727 m), weight 138 lb  3.2 oz (62.7 kg), SpO2 100 %.  HEENT: Oropharynx without visible mass, upper and lower denture plate, neck without mass  Lungs: Here bilaterally, no respiratory distress  Cardiac: Regular rate and rhythm  Abdomen: No hepatosplenomegaly, no mass, nontender  GU: Uncircumcised male, testes without mass  Vascular: No leg edema  Lymph nodes: No cervical, supraclavicular, or inguinal nodes. "Shotty "bilateral axillary nodes.  Neurologic: Alert and oriented, the motor exam appears intact in the upper and lower extremities  Skin: No rash  Musculoskeletal: No spine tenderness  LAB:  CBC  Lab Results  Component Value Date   WBC 7.7 01/30/2016   HGB 9.3 (L) 01/30/2016   HCT 27.6 (L) 01/30/2016   MCV 95.2 01/30/2016   PLT 330 01/30/2016   NEUTROABS 5.8 01/30/2016     CMP      Component Value Date/Time   NA 137 01/24/2016  0530   K 3.9 01/24/2016 0530   CL 107 01/24/2016 0530   CO2 27 01/24/2016 0530   GLUCOSE 99 01/24/2016 0530   BUN 10.5 01/30/2016 1224   CREATININE 0.8 01/30/2016 1224   CALCIUM 7.8 (L) 01/24/2016 0530   PROT 6.9 01/23/2016 1034   ALBUMIN 3.7 01/23/2016 1034   AST 19 01/23/2016 1034   ALT 10 (L) 01/23/2016 1034   ALKPHOS 66 01/23/2016 1034   BILITOT 0.7 01/23/2016 1034   GFRNONAA >60 01/24/2016 0530   GFRAA >60 01/24/2016 0530    Imaging:  Ct Abdomen Pelvis W Contrast  Result Date: 02/24/2016 CLINICAL DATA:  Newly diagnosed esophageal cancer. Prostate cancer 10 years ago on lung cancer 2 years ago, prior chemotherapy and radiation therapy. Difficulty swallowing. EXAM: CT ABDOMEN AND PELVIS WITH CONTRAST TECHNIQUE: Multidetector CT imaging of the abdomen and pelvis was performed using the standard protocol following bolus administration of intravenous contrast. CONTRAST:  179m ISOVUE-300 IOPAMIDOL (ISOVUE-300) INJECTION 61% COMPARISON:  02/11/2016 and 01/13/2015 FINDINGS: Streak artifact from dense barium in the colon left over from the patient's esophagram  of 02/11/2016, mildly obscures surrounding findings. Lower chest: Small right pleural effusion, nonspecific for transudative or exudative etiology. Distal esophageal wall thickening just above the level of an approximately 2.1 by 1.6 cm intraluminal mass, image 7/2. Mild atelectasis in the right lower lobe. Focal 1.7 by 1.7 cm region of sub solid irregular opacity in the left lower lobe on image 8/3, similar to prior. 4 mm anterior left lower lobe new slightly nodular 1.3 by 0.7 cm density in the left posterior costophrenic angle on image 11/3, given that this is new over the last month at most likely is due to atelectasis. Nodule, image 5/3. Hepatobiliary: 4 mm hypodense lesion in the right hepatic lobe on image 28/2, likely a tiny cysts but technically too small to characterize. Similar lesion posteriorly in the right hepatic lobe on image 22/2, 4 mm in diameter. Pancreas: Unremarkable Spleen: Punctate calcification in the spleen, likely from old granulomatous disease. Adrenals/Urinary Tract: Stable large simple appearing cyst of the left kidney with adjacent smaller cyst and mild stranding in the perirenal space below the level of the cyst which is new compared to prior exams. Relatively empty urinary bladder. Stomach/Bowel: Dense barium left over in the colon from esophagram from over 10 days ago, possibly suggesting constipation or mild ileus. Mild sigmoid colon diverticulosis. Vascular/Lymphatic: Gastrohepatic ligament lymph nodes up to about 1 cm in short axis. Also there is a partially calcified 1.9 by 2.7 cm structure in the gastrohepatic ligament on image 13/2 which is not significantly hypermetabolic on prior PET-CT from 07/18/2014 and measured 2.5 by 1.5 cm by my measurements on that exam. Accordingly it appears slightly larger today. New aortocaval node measuring 1.7 cm in short axis on image 24/2, this was not present on 07/18/2014. Infrarenal abdominal aortic aneurysm, 4.4 cm anterior-posterior by 4.2  cm transverse on image 29/2, previously 4.2 by 3.7 cm on 07/18/2014. Aortoiliac atherosclerotic vascular disease. Reproductive: Fiducials around the prostate gland with prostate calcifications and indistinct margins of the relatively normal sized prostate gland. Other: No supplemental non-categorized findings. Musculoskeletal: Small indirect right inguinal hernia contains a loop of small bowel. No current findings of obstruction or strangulation. Old right rib fractures some of which are displaced in nonunited. New lytic destructive lesion of the left iliac bone, 3.8 cm by approximately 2.0 cm, cortical destruction with soft tissue extension along the left iliacus muscle. New 3 mm lytic lesion  in the right iliac bone, image 45/2. Various lesions with faint sclerosis at observed at T11, T12, L3, L5, and S1, concerning for metastatic lesions. IMPRESSION: 1. Intraluminal mass in the distal esophagus with associated wall thickening as shown on recent esophagram. Adenopathy in the gastrohepatic ligament. 2. New osseous metastatic disease with a large lytic lesion in the left iliac bone with extraosseous extension; a smaller lytic lesion in the right iliac bone; and scattered faintly sclerotic lesions in the lower thoracic and lumbar spine concerning for osseous metastatic disease as well. 3. In the lung bases there are several nonspecific nodules including a 1.7 cm sub solid nodule in the left lower lobe which could reflect malignancy. 4. Small right pleural effusion, nonspecific for transudative or exudative etiology, with adjacent rib fracture nonunion. 5. Infrarenal abdominal aortic aneurysm, has been slowly enlarging, current size 4.4 cm. Recommend followup by ultrasound in 1 year. This recommendation follows ACR consensus guidelines: White Paper of the ACR Incidental Findings Committee II on Vascular Findings. J Am Coll Radiol 2013; 10:789-794. 6. New retroperitoneal adenopathy including and aortocaval node  measuring 1.7 cm in short axis. 7. New small indirect right inguinal hernia containing a loop of small bowel without current findings of obstruction or strangulation. Electronically Signed   By: Van Clines M.D.   On: 02/24/2016 08:54    CT images were reviewed with [name] and his daughter.  Assessment/Plan:   1. Adenocarcinoma the esophagus  upper endoscopy 02/17/2016 confirmed a distal esophagus mass  CT chest 01/30/2016-increased in size of bilateral lung nodules  CT abdomen/pelvis 02/23/2016 distal esophagus mass, gastrohepatic adenopathy, iliac and probable direct a lumbar metastases, 1.7 cm aortocaval node, nonspecific lung nodules  2.   Stage Ia (T1 N0) non-small cell carcinoma, favoring squamous cell carcinoma, of the right lung October 2016, treated with SRS, completed 02/25/2015  3.   Prostate cancer treated with external beam radiation  4.   COPD  5.   Coronary artery disease  6.   Peripheral vascular disease  7.  Anemia   Disposition:   [name] has been diagnosed with adenocarcinoma of the esophagus. The staging abdomen/pelvic and chest CTs are concerning for metastatic disease involving lung nodules, retroperitoneal lymph nodes, and the bones. He understands the metastatic tumor burden could be related to esophagus cancer, lung cancer, or prostate cancer.  He is tolerating liquids at present. We encouraged him to use nutrition supplements and he will be scheduled with the Cancer center nutritionist.  [name] will be referred for a biopsy of the iliac lesion and return for a follow-up visit on 03/05/2016.  If he is proven to have metastatic disease treatment will be palliative. We can consider palliative radiation to the esophagus followed by a trial of systemic therapy.  Approximately 50 minutes were spent with the patient today. The majority of the time was used for counseling and coordination of care.   [name], MD  02/24/2016, 2:19  PM

## 2016-02-24 NOTE — Progress Notes (Signed)
Please see the Nurse Progress Note in the MD Initial Consult Encounter for this patient. 

## 2016-02-24 NOTE — Progress Notes (Signed)
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 male with a history of esophageal cancer. The patient presented with painful swallowing that started some months ago. Biopsy of the esophagus on 02/17/16 revealed adenocarcinoma with focal signet ring features in a background of Barrett's esophagus with high grade dysplasia.   The patient reports he has lost 40 lbs in the last 8 months. He has an occasional cough, but denies hemoptysis. The patient reports he can swallow most foods if he chews thoroughly, but he has some swallowing pain. He reports he has lidocaine, but he does not like to use it because of the taste. He denies pain. The patient is accompanied by his daughter today.  Dr. Benay Spice is scheduling a biopsy of the lytic lesion in his left iliac bone.  The patient reports to Radiation Oncology today to discuss the role that radiation may play in the treatment of his disease.  PREVIOUS RADIATION THERAPY: Yes  02/18/15 - 02/25/15 : Right upper lung treated to 54 Gy Dr. Pablo Ledger, radiation therapy for prostate cancer - Dr. Valere Dross  PAST MEDICAL HISTORY:  has a past medical history of AAA (abdominal aortic aneurysm) ([place]); Anxiety; CAD (coronary artery disease); Carotid artery disease (HCC); COPD (chronic obstructive pulmonary disease) ([place]); GERD (gastroesophageal reflux disease); HTN (hypertension); Hydradenitis; Hyperlipidemia; Ischemic cardiomyopathy; Myocardial infarction; Non-small cell lung cancer (NSCLC) ([place]) (dx'd 12/2014); Osteoarthritis; PAC (premature atrial contraction); Peripheral vascular disease ([place]); Pneumonia; Prostate cancer ([place])  (dx'd approx 2008); Radiation (02/18/15-02/25/15); Shortness of breath dyspnea; Stroke ([place]) (11-24-11); Urinary frequency; and Urinary urgency.    PAST SURGICAL HISTORY: Past Surgical History:  Procedure Laterality Date  . CARDIAC CATHETERIZATION    . CARDIOVASCULAR STRESS TEST  12-2006  . CARDIOVERSION N/A 10/31/2015   Procedure: CARDIOVERSION;  Surgeon: Sueanne Margarita, MD;  Location: [facility];  Service: Cardiovascular;  Laterality: N/A;  . CAROTID ENDARTERECTOMY Right 03-2000   CE  . CAROTID ENDARTERECTOMY Left 11-24-11   CE  . COLONOSCOPY  04-2001   polyp  . [place], 2002, 2009, 2012   8 stents total  . [place]   neg  . ENDARTERECTOMY  11/24/2011   Procedure: ENDARTERECTOMY CAROTID;  Surgeon: Rosetta Posner, MD;  Location: [facility];  Service: Vascular;  Laterality: Left;  . EYE SURGERY Bilateral Aug. 17, 2015   Cataract  . FUDUCIAL PLACEMENT N/A 12/25/2014   Procedure: PLACEMENT OF FUDUCIAL;  Surgeon: Collene Gobble, MD;  Location: [facility];  Service: Thoracic;  Laterality: N/A;  . PROSTATE SURGERY     Radiation Tx  X's 40  . TEE WITHOUT CARDIOVERSION N/A 10/31/2015   Procedure: TRANSESOPHAGEAL ECHOCARDIOGRAM (TEE);  Surgeon: Sueanne Margarita, MD;  Location: [facility];  Service: Cardiovascular;  Laterality: N/A;  . VASCULAR SURGERY    . VIDEO BRONCHOSCOPY WITH ENDOBRONCHIAL NAVIGATION N/A 12/25/2014   Procedure: VIDEO BRONCHOSCOPY WITH ENDOBRONCHIAL NAVIGATION  with Biopsies and Brushings;  Surgeon: Collene Gobble, MD;  Location: [facility];  Service: Thoracic;  Laterality: N/A;  . VIDEO BRONCHOSCOPY WITH ENDOBRONCHIAL ULTRASOUND N/A 01/22/2015   Procedure: VIDEO BRONCHOSCOPY WITH ENDOBRONCHIAL ULTRASOUND with Biopsies;  Surgeon: Collene Gobble,  MD;  Location: [facility];  Service: Thoracic;  Laterality: N/A;    FAMILY HISTORY: family history includes Cancer in his brother and sister; Diabetes in his maternal grandmother; Heart disease in his father and  mother; Liver cancer in his brother; Other in his brother; Stroke in his father.  SOCIAL HISTORY:  reports that he has been smoking Cigarettes.  He has a 27.50 pack-year smoking history. He has never used smokeless tobacco. He reports that he drinks alcohol. He reports that he does not use drugs.  ALLERGIES: Augmentin [amoxicillin-pot clavulanate]; Chantix [varenicline]; Sulfonamide derivatives; and Zantac [ranitidine hcl]  MEDICATIONS:  Current Outpatient Prescriptions  Medication Sig Dispense Refill  . acetaminophen (TYLENOL) 500 MG tablet Take 1,000 mg by mouth every 4 (four) hours as needed for mild pain, moderate pain, fever or headache.     . albuterol (PROVENTIL HFA;VENTOLIN HFA) 108 (90 Base) MCG/ACT inhaler Inhale 2 puffs into the lungs every 4 (four) hours as needed for wheezing or shortness of breath. 1 Inhaler 5  . ALPRAZolam (XANAX) 0.5 MG tablet Take 1 tablet (0.5 mg total) by mouth at bedtime as needed for anxiety or sleep. 30 tablet 0  . clopidogrel (PLAVIX) 75 MG tablet Take 1 tablet (75 mg total) by mouth daily. 30 tablet 11  . docusate sodium (COLACE) 100 MG capsule Take 100 mg by mouth daily as needed for mild constipation. Takes every other day    . ezetimibe (ZETIA) 10 MG tablet TAKE 1 TABLET (10 MG TOTAL) BY MOUTH DAILY. 30 tablet 11  . fludrocortisone (FLORINEF) 0.1 MG tablet Take 1 tablet (0.1 mg total) by mouth daily. 30 tablet 0  . Fluticasone-Salmeterol (ADVAIR DISKUS) 500-50 MCG/DOSE AEPB Inhale 1 puff into the lungs 2 (two) times daily. 60 each 5  . ipratropium-albuterol (DUONEB) 0.5-2.5 (3) MG/3ML SOLN Take 3 mLs by nebulization every 4 (four) hours as needed (shortness of breath and wheezing).    Marland Kitchen lidocaine (XYLOCAINE) 2 % solution 10 mL PO every 4 hours before meals and bedtime, and as needed 1200 mL 1  . loratadine (CLARITIN) 10 MG tablet TAKE 1 TABLET BY MOUTH EVERY DAY 30 tablet 5  . mirtazapine (REMERON) 15 MG tablet Take 1 tablet (15 mg total) by mouth at  bedtime. 90 tablet 3  . Multiple Vitamin (MULTIVITAMIN WITH MINERALS) TABS tablet Take 1 tablet by mouth daily.    . nitroGLYCERIN (NITROSTAT) 0.4 MG SL tablet Place 0.4 mg under the tongue every 5 (five) minutes as needed for chest pain.    Marland Kitchen omeprazole (PRILOSEC) 40 MG capsule Take 1 tablet 30 minutes before breakfast and dinner 60 capsule 11  . tiotropium (SPIRIVA) 18 MCG inhalation capsule Place 1 capsule (18 mcg total) into inhaler and inhale daily. 30 capsule 5   Current Facility-Administered Medications  Medication Dose Route Frequency Provider Last Rate Last Dose  . 0.9 %  sodium chloride infusion  500 mL Intravenous Continuous Ladene Artist, MD        REVIEW OF SYSTEMS:  A 15 point review of systems is documented in the electronic medical record. This was obtained by the nursing staff. However, I reviewed this with the patient to discuss relevant findings and make appropriate changes.  Pertinent items noted in HPI and remainder of comprehensive ROS otherwise negative.   PHYSICAL EXAM:  Vitals with BMI 02/24/2016  Height '5\' 8"'$   Weight 138 lbs 3 oz  BMI 85.0  Systolic 277  Diastolic 62  Pulse 94  Respirations 18  Vitals were taken in Dr. Gearldine Shown office today.  General: Alert and oriented, in no acute distress HEENT: Head is normocephalic. Extraocular movements are intact. Oropharynx is clear. Neck: Neck is supple, no palpable cervical or supraclavicular lymphadenopathy. Heart: Regular in rate and rhythm with no murmurs, rubs, or gallops. Chest: Clear to auscultation bilaterally, with no rhonchi, wheezes, or rales. Abdomen: Soft, nontender, nondistended, with no rigidity or guarding. Extremities: No cyanosis or edema. Lymphatics: see Neck Exam Skin: No concerning lesions. Psychiatric: Judgment and insight are intact. Affect is appropriate.  LABORATORY DATA:  Lab Results  Component Value Date   WBC 7.7 01/30/2016   HGB 9.3 (L) 01/30/2016   HCT 27.6 (L) 01/30/2016    MCV 95.2 01/30/2016   PLT 330 01/30/2016   NEUTROABS 5.8 01/30/2016   Lab Results  Component Value Date   NA 137 01/24/2016   K 3.9 01/24/2016   CL 107 01/24/2016   CO2 27 01/24/2016   GLUCOSE 99 01/24/2016   CREATININE 0.8 01/30/2016   CALCIUM 7.8 (L) 01/24/2016      RADIOGRAPHY: Ct Chest W Contrast  Result Date: 01/30/2016 CLINICAL DATA:  Recent fall with rib fracture.Followup lung cancer. Radiation therapy complete October 2017. EXAM: CT CHEST WITH CONTRAST TECHNIQUE: Multidetector CT imaging of the chest was performed during intravenous contrast administration. CONTRAST:  2m ISOVUE-300 IOPAMIDOL (ISOVUE-300) INJECTION 61% COMPARISON:  CT 10/27/2015 FINDINGS: Cardiovascular: Coronary artery calcification and aortic atherosclerotic calcification. Mediastinum/Nodes: No axillary or supraclavicular adenopathy. No mediastinal hilar adenopathy. No pericardial fluid. Esophagus is thickened throughout its course. Lungs/Pleura: Small RIGHT effusion. RIGHT upper lobe nodule measures 13 mm increased from 7 mm (image 48, series 4). RIGHT lower lobe nodule along the pleural surface measures 7 mm (image 95, series 4) compared to 6 mm. 7 mm nodule in the LEFT upper lobe (image 65, series 4) is increased from 5 mm. Small LEFT lower lobe nodule measuring 3 mm (image 120, series 4 is increased from 2 mm . Upper Abdomen: Limited view of the liver, kidneys, pancreas are unremarkable. Calcified gastrohepatic ligament lymph node measuring 18 mm is unchanged. Adrenal glands are stable. Musculoskeletal: No aggressive osseous lesion. IMPRESSION: 1. Interval increase in size of bilateral pulmonary nodules is consistent PULMONARY METASTASIS. 2. Diffuse thickening of the esophagus suggests esophagitis. 3. No mediastinal lymphadenopathy Electronically Signed   By: SSuzy BouchardM.D.   On: 01/30/2016 16:16   Ct Abdomen Pelvis W Contrast  Result Date: 02/24/2016 CLINICAL DATA:  Newly diagnosed esophageal cancer.  Prostate cancer 10 years ago on lung cancer 2 years ago, prior chemotherapy and radiation therapy. Difficulty swallowing. EXAM: CT ABDOMEN AND PELVIS WITH CONTRAST TECHNIQUE: Multidetector CT imaging of the abdomen and pelvis was performed using the standard protocol following bolus administration of intravenous contrast. CONTRAST:  1019mISOVUE-300 IOPAMIDOL (ISOVUE-300) INJECTION 61% COMPARISON:  02/11/2016 and 01/13/2015 FINDINGS: Streak artifact from dense barium in the colon left over from the patient's esophagram of 02/11/2016, mildly obscures surrounding findings. Lower chest: Small right pleural effusion, nonspecific for transudative or exudative etiology. Distal esophageal wall thickening just above the level of an approximately 2.1 by 1.6 cm intraluminal mass, image 7/2. Mild atelectasis in the right lower lobe. Focal 1.7 by 1.7 cm region of sub solid irregular opacity in the left lower lobe on image 8/3, similar to prior. 4 mm anterior left lower lobe new slightly nodular 1.3 by 0.7 cm density in the left posterior costophrenic angle on image 11/3, given that this is new over the last month  at most likely is due to atelectasis. Nodule, image 5/3. Hepatobiliary: 4 mm hypodense lesion in the right hepatic lobe on image 28/2, likely a tiny cysts but technically too small to characterize. Similar lesion posteriorly in the right hepatic lobe on image 22/2, 4 mm in diameter. Pancreas: Unremarkable Spleen: Punctate calcification in the spleen, likely from old granulomatous disease. Adrenals/Urinary Tract: Stable large simple appearing cyst of the left kidney with adjacent smaller cyst and mild stranding in the perirenal space below the level of the cyst which is new compared to prior exams. Relatively empty urinary bladder. Stomach/Bowel: Dense barium left over in the colon from esophagram from over 10 days ago, possibly suggesting constipation or mild ileus. Mild sigmoid colon diverticulosis.  Vascular/Lymphatic: Gastrohepatic ligament lymph nodes up to about 1 cm in short axis. Also there is a partially calcified 1.9 by 2.7 cm structure in the gastrohepatic ligament on image 13/2 which is not significantly hypermetabolic on prior PET-CT from 07/18/2014 and measured 2.5 by 1.5 cm by my measurements on that exam. Accordingly it appears slightly larger today. New aortocaval node measuring 1.7 cm in short axis on image 24/2, this was not present on 07/18/2014. Infrarenal abdominal aortic aneurysm, 4.4 cm anterior-posterior by 4.2 cm transverse on image 29/2, previously 4.2 by 3.7 cm on 07/18/2014. Aortoiliac atherosclerotic vascular disease. Reproductive: Fiducials around the prostate gland with prostate calcifications and indistinct margins of the relatively normal sized prostate gland. Other: No supplemental non-categorized findings. Musculoskeletal: Small indirect right inguinal hernia contains a loop of small bowel. No current findings of obstruction or strangulation. Old right rib fractures some of which are displaced in nonunited. New lytic destructive lesion of the left iliac bone, 3.8 cm by approximately 2.0 cm, cortical destruction with soft tissue extension along the left iliacus muscle. New 3 mm lytic lesion in the right iliac bone, image 45/2. Various lesions with faint sclerosis at observed at T11, T12, L3, L5, and S1, concerning for metastatic lesions. IMPRESSION: 1. Intraluminal mass in the distal esophagus with associated wall thickening as shown on recent esophagram. Adenopathy in the gastrohepatic ligament. 2. New osseous metastatic disease with a large lytic lesion in the left iliac bone with extraosseous extension; a smaller lytic lesion in the right iliac bone; and scattered faintly sclerotic lesions in the lower thoracic and lumbar spine concerning for osseous metastatic disease as well. 3. In the lung bases there are several nonspecific nodules including a 1.7 cm sub solid nodule in  the left lower lobe which could reflect malignancy. 4. Small right pleural effusion, nonspecific for transudative or exudative etiology, with adjacent rib fracture nonunion. 5. Infrarenal abdominal aortic aneurysm, has been slowly enlarging, current size 4.4 cm. Recommend followup by ultrasound in 1 year. This recommendation follows ACR consensus guidelines: White Paper of the ACR Incidental Findings Committee II on Vascular Findings. J Am Coll Radiol 2013; 10:789-794. 6. New retroperitoneal adenopathy including and aortocaval node measuring 1.7 cm in short axis. 7. New small indirect right inguinal hernia containing a loop of small bowel without current findings of obstruction or strangulation. Electronically Signed   By: Van Clines M.D.   On: 02/24/2016 08:54   Dg Esophagus  Addendum Date: 02/11/2016   ADDENDUM REPORT: 02/11/2016 11:19 ADDENDUM: These results were called by telephone at the time of interpretation on 02/11/2016 at 11:19 am to [facility] , who verbally acknowledged these results. Electronically Signed   By: Rolm Baptise M.D.   On: 02/11/2016 11:19   Result Date: 02/11/2016  CLINICAL DATA:  Dysphagia. Food sticking in throat and upper chest for 3-4 months. Weight loss. Decreased appetite. EXAM: ESOPHOGRAM/BARIUM SWALLOW TECHNIQUE: Combined double contrast and single contrast examination performed using effervescent crystals, thick barium liquid, and thin barium liquid. FLUOROSCOPY TIME:  Fluoroscopy Time:  2 minutes 6 seconds Radiation Exposure Index (if provided by the fluoroscopic device): Number of Acquired Spot Images: 0 COMPARISON:  None. FINDINGS: Fluoroscopic evaluation of swallowing demonstrates disruption of 3 out of 3 primary esophageal peristaltic waves. There is an area of irregular narrowing in the distal esophagus. Below this area of narrowing, there is a persistent filling defect. Findings are worrisome for esophageal cancer. The degree of narrowing is moderate to  severe. Given how tight narrowing is, the barium tablet was not administered. IMPRESSION: Area of irregular tight narrowing within the distal esophagus. Below this area, there is a large filling defect in the distal esophagus. Findings are concerning for esophageal cancer. Recommend direct visualization. Electronically Signed: By: Rolm Baptise M.D. 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 Patient is not symptomatic from his left pelvic lesion but is symptomatic at the primary site with significant odynophagia and some dysphagia. Patient will be a good candidate for aggressive palliative radiation therapy directed at the distal esophageal area along with radiosensitizing chemotherapy.  We discussed the risks, benefits, and side effects of radiation treatment. We discussed the logistics of radiation for the patient's benefit. The patient would be a good candidate for radiation therapy pending results of his upcoming left iliac bone biopsy.  PLAN: Pending biopsy results, we will schedule CT Simulation and radiation treatments as needed.    ------------------------------------------------  Blair Promise, PhD, MD  This document serves as a record of services personally performed by Gery Pray, MD. It was created on his behalf by Maryla Morrow, a trained medical scribe. The creation of this record is based on the scribe's personal observations and the provider's statements to them. This document has been checked and approved by the attending provider.

## 2016-02-24 NOTE — Telephone Encounter (Signed)
Spoke with pt. He is needing refills on Ventolin, Advair and Spiriva. These will need to be faxed to [facility]. Rxs have been signed by TP as RB is not in the office. They have been faxed to the above number that the pt left. Nothing further was needed.

## 2016-02-24 NOTE — Patient Instructions (Signed)
Care Plan Summary- 02/24/2016 Name:  [name]       DOB:  February 04, 1941 Your Medical Team:  Medical Oncologist:  Dr. Ma Rings Radiation Oncologist:  Dr. Gery Pray Surgeon:    Type of Cancer:  Adenocarcinoma of Esophagus  Stage/Grade: Undetermined *Exact staging of your cancer is based on size of the tumor, depth of invasion, involvement of lymph nodes or not, and whether or not the cancer has spread beyond the primary site   Recommendations: Based on information available as of today's consult. Recommendations may change depending on the results of further tests or exams. 1) Need to biopsy left iliac lytic lesion-r/o lung vs esophageal metastasis 2) Potentially radiation therapy for 5-6 weeks with IV chemotherapy weekly X 4 3) Start Miralax 17 grams in 8 ounces fluid daily to help bowels Next Steps: 1) Hold Plavix starting tonight and radiology will call with biopsy appointment 2)   We will make referral to dietician 3)   Work on increase in protein and fluid intake 4)   Return to Dr. Benay Spice on 03/05/16 at 2 pm as scheduled   Questions? Merceda Elks, RN, BSN at [phone]. [name] is your Oncology Nurse Navigator and is available to assist you while you're receiving your medical care at [facility].

## 2016-02-25 ENCOUNTER — Encounter: Payer: Self-pay | Admitting: Family

## 2016-02-26 ENCOUNTER — Telehealth: Payer: Self-pay | Admitting: *Deleted

## 2016-02-26 NOTE — Telephone Encounter (Signed)
Daughter called and left message asking what stage is her father's cancer? Per Dr. Benay Spice, he is a stage IV and biopsy will hopefully determine the primary site. Attempted to call back--went to voice mail. Just left message for her to call back.

## 2016-02-27 ENCOUNTER — Encounter: Payer: Self-pay | Admitting: *Deleted

## 2016-02-27 DIAGNOSIS — H34232 Retinal artery branch occlusion, left eye: Secondary | ICD-10-CM | POA: Diagnosis not present

## 2016-02-27 NOTE — Telephone Encounter (Signed)
Reached daughter on her mobile # and provided the information she had requested.

## 2016-02-28 ENCOUNTER — Telehealth: Payer: Self-pay

## 2016-02-28 MED ORDER — FLUDROCORTISONE ACETATE 0.1 MG PO TABS: 0.1000 mg | ORAL_TABLET | Freq: Every day | ORAL | 0 refills | Status: DC

## 2016-02-28 NOTE — Telephone Encounter (Signed)
See Dr Glori Bickers note

## 2016-02-28 NOTE — Telephone Encounter (Signed)
Contacted pt and informed PCP sent in refill as requested. Also gave PCP's recommendation to go to ED if syncope occurs.

## 2016-02-28 NOTE — Telephone Encounter (Signed)
[facility] called today and stated that pt passed out yesterday. Pt is requesting a refill of florinef. Pt has not had. No request from the pharmacy in [place]. LOV with PCP was 02/03/2016. Please advise if refill is approved.   [facility] [address] [place]

## 2016-02-28 NOTE — Telephone Encounter (Signed)
I refilled his florinef times one  Please keep [place] updated  Go to ED if needed if syncope re occurs

## 2016-03-01 ENCOUNTER — Other Ambulatory Visit: Payer: Self-pay | Admitting: Radiology

## 2016-03-01 MED ORDER — FLUDROCORTISONE ACETATE 0.1 MG PO TABS: 0.1000 mg | ORAL_TABLET | Freq: Every day | ORAL | 5 refills | Status: DC

## 2016-03-01 NOTE — Telephone Encounter (Signed)
Pt did get Rx and is feeling better, he hasn't checked his BP recently, Rx filled

## 2016-03-01 NOTE — Telephone Encounter (Signed)
I did refill his Florinef on the 9th when I got the message over the weekend. Please check in with pt to make sure he got it and feels better (and bp is better)  Thanks  If he is doing better please re send with 5 more refills

## 2016-03-01 NOTE — Addendum Note (Signed)
Addended by: Tammi Sou on: 03/01/2016 12:43 PM   Modules accepted: Orders

## 2016-03-01 NOTE — Telephone Encounter (Signed)
PLEASE NOTE: All timestamps contained within this report are represented as [place] Standard Time. CONFIDENTIALTY NOTICE: This fax transmission is intended only for the addressee. It contains information that is legally privileged, confidential or otherwise protected from use or disclosure. If you are not the intended recipient, you are strictly prohibited from reviewing, disclosing, copying using or disseminating any of this information or taking any action in reliance on or regarding this information. If you have received this fax in error, please notify us immediately by telephone so that we can arrange for its return to [place]. Phone: [phone], Toll-Free: [phone], Fax: [phone] Page: 1 of 3 Call Id: [id number] [place] Patient Name: [name] Gender: Male DOB: [birth year] Age: 75 Y 28 M 9 D Return Phone Number: [phone] (Primary) Address: City/State/Zip: [place] Client [place] Night - Client Client Site [place] [facility] [place], Roque Lias - MD Contact Type Call Who Is Calling Patient / Member / Family / Caregiver Call Type Triage / Clinical Caller Name Antony Haste Relationship To Patient Daughter Return Phone Number [phone] (Primary) Chief Complaint [place] or [place] Reason for Call Symptomatic / Request for Health Information Initial Comment father blood pressure is very low, passed out. PreDisposition Call Doctor Translation No Nurse Assessment Nurse: Ruthann Cancer, RN, Apolonio Schneiders Date/Time Eilene Ghazi Time): 02/28/2016 9:31:17 AM Confirm and document reason for call. If symptomatic, describe symptoms. ---Caller says yesterday her father went to the eye dr. He finished his appointment & was going to go to the restroom. He leaned against the wall while waiting & passed out. He slid down the wall. He has had issues in  the past with his blood pressure dropping. About a month ago he was in the hospital & the dr. prescribed a medication to keep his BP up. He ran out of that medication & has missed a few doses of that. The pharmacy has faxed a request for that medication but has had no response. The episode that happened yesterday was very quick. He was out for less than a minute. She did not have a blood pressure cuff available so did not know how low his blood pressure was. He is currently sleeping so she has not taken his BP today. He did not hit his head when he fell. Does the patient have any new or worsening symptoms? ---Yes Will a triage be completed? ---Yes Related visit to physician within the last 2 weeks? ---No Does the PT have any chronic conditions? (i.e. diabetes, asthma, etc.) ---Yes List chronic conditions. ---low BP; COPD; esophagus cancer Is this a behavioral health or substance abuse call? ---No PLEASE NOTE: All timestamps contained within this report are represented as [place] Standard Time. CONFIDENTIALTY NOTICE: This fax transmission is intended only for the addressee. It contains information that is legally privileged, confidential or otherwise protected from use or disclosure. If you are not the intended recipient, you are strictly prohibited from reviewing, disclosing, copying using or disseminating any of this information or taking any action in reliance on or regarding this information. If you have received this fax in error, please notify us immediately by telephone so that we can arrange for its return to [place]. Phone: [phone], Toll-Free: [phone], Fax: [phone] Page: 2 of 3 Call Id: [id number] Guidelines Guideline Title Affirmed Question Affirmed Notes Nurse Date/Time Eilene Ghazi Time) Fainting [1] Fainted > 15 minutes ago AND [2] still feels too weak or  dizzy to stand [place], RNApolonio Schneiders 02/28/2016 9:36:13 AM Disp. Time Eilene Ghazi Time) Disposition Final User 02/28/2016  9:29:29 AM Send to Urgent Lissa Hoard, Rebekah 02/28/2016 9:29:36 AM Send to Urgent Lissa Hoard, Rebekah 02/28/2016 9:42:28 AM Paged On Call back to [facility], RN, Apolonio Schneiders 02/28/2016 9:55:13 AM Paged On Call back to [facility], [place], Apolonio Schneiders 02/28/2016 10:01:21 AM Paged On Call back to [facility], RN, Apolonio Schneiders 02/28/2016 10:11:09 AM Paged On Call back to [facility], RNApolonio Schneiders 02/28/2016 10:46:27 AM Send To RN Personal Germain Osgood, RN, Jane 02/28/2016 10:52:43 AM [place], RN, Apolonio Schneiders Reason: pt did not call 911 but reached out to MD & office 02/28/2016 10:52:50 AM Call Completed Ruthann Cancer RN, Apolonio Schneiders 02/28/2016 9:46:13 AM Call EMS 911 Now Yes Ruthann Cancer, RN, Herminio Heads Understands: Yes Disagree/Comply: Disagree Disagree/Comply Reason: Wait and see Care Advice Given Per Guideline CARE ADVICE given per Fainting (Adult) guideline. CALL EMS 911 NOW: Immediate medical attention is needed. You need to hang up and call 911 (or an ambulance). ([facility] Discretion: I'll call you back in a few minutes to be sure you were able to reach them.) CALL EMS 911 NOW: Immediate medical attention is needed. You need to hang up and call 911 (or an ambulance). ([facility] Discretion: I'll call you back in a few minutes to be sure you were able to reach them.) CARE ADVICE given per Fainting (Adult) guideline. Since this episode happened yesterday reaching out to the on call MD at this time for further advice. Pt sleeping now with no symptoms at this time (per daughter) Comments User: Bertell Maria, RN Date/Time Eilene Ghazi Time): 02/28/2016 9:46:41 AM reaching out to on call MD User: Bertell Maria, RN Date/Time Eilene Ghazi Time): 02/28/2016 10:00:48 AM Caller states her father would not be willing to venture out anywhere today as they have snow. Advised that the [place] office has Saturday hours until 1 PM today but she states she will not make him an appointment today since he  doesn't want to leave the house. She would like to have input from the dr. Marland Kitchen is hoping that they might call in his prescription (orginally prescribed through ED) of Fludrocortisone Acetate 0.1 mg x1 a day to the [facility] on [address] [phone]. User: Bertell Maria, RN Date/Time Eilene Ghazi Time): 02/28/2016 10:17:45 AM left a message on [phone] [facility] office #) for a call back since have not been able to reach on call MD User: Bertell Maria, RN Date/Time Eilene Ghazi Time): 02/28/2016 10:19:58 AM PLEASE NOTE: All timestamps contained within this report are represented as [place] Standard Time. CONFIDENTIALTY NOTICE: This fax transmission is intended only for the addressee. It contains information that is legally privileged, confidential or otherwise protected from use or disclosure. If you are not the intended recipient, you are strictly prohibited from reviewing, disclosing, copying using or disseminating any of this information or taking any action in reliance on or regarding this information. If you have received this fax in error, please notify us immediately by telephone so that we can arrange for its return to [place]. Phone: [phone], Toll-Free: [phone], Fax: [phone] Page: 3 of 3 Call Id: [id number] Comments attempted to reach back line at [phone] & left a message User: Bertell Maria, RN Date/Time Eilene Ghazi Time): 02/28/2016 10:52:21 AM called pt's daughter & let her know that office has been reached & they are supposed to be calling her back to see if med can be prescribed. Advised again that if pt has any more  fainting spells they should call 911. Please call us back if any symptoms develop or questions are not resolved. Paging DoctorName Phone DateTime Result/Outcome Message Type Notes Garnet Koyanagi - MD [phone] 02/28/2016 9:42:28 AM Paged On Call Back to Call Center Doctor Paged please call Lanette Hampshire at [facility] [phone]. Thanks! Garnet Koyanagi - MD  [phone] 02/28/2016 9:55:13 AM Called On Call Provider - Left Message Doctor Paged Garnet Koyanagi - MD [phone] 02/28/2016 10:01:21 AM Called on Call provider - No message left Doctor Paged Garnet Koyanagi - MD [phone] 02/28/2016 10:11:09 AM Called on Call provider - No message left Doctor Paged Garnet Koyanagi - MD 02/28/2016 10:45:08 AM Spoke with On Call - General Message Result Spoke with office staff regarding patient's symptoms. Dr Etter Sjogren has not ordered this med for patient. Will send message to Dr. Etter Sjogren and call patient back.

## 2016-03-02 ENCOUNTER — Telehealth: Payer: Self-pay | Admitting: *Deleted

## 2016-03-02 ENCOUNTER — Ambulatory Visit (INDEPENDENT_AMBULATORY_CARE_PROVIDER_SITE_OTHER): Payer: Medicare Other | Admitting: Family

## 2016-03-02 ENCOUNTER — Ambulatory Visit: Payer: Medicare Other | Admitting: Nurse Practitioner

## 2016-03-02 ENCOUNTER — Other Ambulatory Visit: Payer: Self-pay | Admitting: Radiology

## 2016-03-02 ENCOUNTER — Encounter: Payer: Self-pay | Admitting: Family

## 2016-03-02 ENCOUNTER — Ambulatory Visit (HOSPITAL_COMMUNITY)
Admission: RE | Admit: 2016-03-02 | Discharge: 2016-03-02 | Disposition: A | Discharge status: Home / Self Care | Payer: Medicare Other | Source: Ambulatory Visit | Attending: Family | Admitting: Family

## 2016-03-02 VITALS — BP 125/71 | HR 93 | Temp 97.0°F | Resp 16 | Wt 134.0 lb

## 2016-03-02 DIAGNOSIS — Z9889 Other specified postprocedural states: Secondary | ICD-10-CM | POA: Insufficient documentation

## 2016-03-02 DIAGNOSIS — Z48812 Encounter for surgical aftercare following surgery on the circulatory system: Secondary | ICD-10-CM | POA: Diagnosis not present

## 2016-03-02 DIAGNOSIS — C155 Malignant neoplasm of lower third of esophagus: Secondary | ICD-10-CM | POA: Insufficient documentation

## 2016-03-02 DIAGNOSIS — Z7902 Long term (current) use of antithrombotics/antiplatelets: Secondary | ICD-10-CM | POA: Insufficient documentation

## 2016-03-02 DIAGNOSIS — Z880 Allergy status to penicillin: Secondary | ICD-10-CM | POA: Insufficient documentation

## 2016-03-02 DIAGNOSIS — Z888 Allergy status to other drugs, medicaments and biological substances status: Secondary | ICD-10-CM | POA: Insufficient documentation

## 2016-03-02 DIAGNOSIS — Z8673 Personal history of transient ischemic attack (TIA), and cerebral infarction without residual deficits: Secondary | ICD-10-CM | POA: Insufficient documentation

## 2016-03-02 DIAGNOSIS — I252 Old myocardial infarction: Secondary | ICD-10-CM | POA: Diagnosis not present

## 2016-03-02 DIAGNOSIS — I491 Atrial premature depolarization: Secondary | ICD-10-CM | POA: Insufficient documentation

## 2016-03-02 DIAGNOSIS — K219 Gastro-esophageal reflux disease without esophagitis: Secondary | ICD-10-CM | POA: Insufficient documentation

## 2016-03-02 DIAGNOSIS — I10 Essential (primary) hypertension: Secondary | ICD-10-CM | POA: Insufficient documentation

## 2016-03-02 DIAGNOSIS — F17200 Nicotine dependence, unspecified, uncomplicated: Secondary | ICD-10-CM

## 2016-03-02 DIAGNOSIS — R634 Abnormal weight loss: Secondary | ICD-10-CM | POA: Insufficient documentation

## 2016-03-02 DIAGNOSIS — Z9221 Personal history of antineoplastic chemotherapy: Secondary | ICD-10-CM | POA: Insufficient documentation

## 2016-03-02 DIAGNOSIS — Z85118 Personal history of other malignant neoplasm of bronchus and lung: Secondary | ICD-10-CM | POA: Insufficient documentation

## 2016-03-02 DIAGNOSIS — C7951 Secondary malignant neoplasm of bone: Secondary | ICD-10-CM | POA: Diagnosis not present

## 2016-03-02 DIAGNOSIS — J449 Chronic obstructive pulmonary disease, unspecified: Secondary | ICD-10-CM | POA: Diagnosis not present

## 2016-03-02 DIAGNOSIS — I739 Peripheral vascular disease, unspecified: Secondary | ICD-10-CM | POA: Insufficient documentation

## 2016-03-02 DIAGNOSIS — F419 Anxiety disorder, unspecified: Secondary | ICD-10-CM | POA: Diagnosis not present

## 2016-03-02 DIAGNOSIS — Z923 Personal history of irradiation: Secondary | ICD-10-CM | POA: Insufficient documentation

## 2016-03-02 DIAGNOSIS — M1990 Unspecified osteoarthritis, unspecified site: Secondary | ICD-10-CM | POA: Insufficient documentation

## 2016-03-02 DIAGNOSIS — Z8546 Personal history of malignant neoplasm of prostate: Secondary | ICD-10-CM | POA: Diagnosis not present

## 2016-03-02 DIAGNOSIS — Z800 Family history of malignant neoplasm of digestive organs: Secondary | ICD-10-CM | POA: Insufficient documentation

## 2016-03-02 DIAGNOSIS — Z8249 Family history of ischemic heart disease and other diseases of the circulatory system: Secondary | ICD-10-CM | POA: Insufficient documentation

## 2016-03-02 DIAGNOSIS — Z803 Family history of malignant neoplasm of breast: Secondary | ICD-10-CM | POA: Insufficient documentation

## 2016-03-02 DIAGNOSIS — E785 Hyperlipidemia, unspecified: Secondary | ICD-10-CM | POA: Diagnosis not present

## 2016-03-02 DIAGNOSIS — I6523 Occlusion and stenosis of bilateral carotid arteries: Secondary | ICD-10-CM | POA: Diagnosis not present

## 2016-03-02 DIAGNOSIS — I255 Ischemic cardiomyopathy: Secondary | ICD-10-CM | POA: Insufficient documentation

## 2016-03-02 DIAGNOSIS — F17210 Nicotine dependence, cigarettes, uncomplicated: Secondary | ICD-10-CM | POA: Diagnosis not present

## 2016-03-02 DIAGNOSIS — I714 Abdominal aortic aneurysm, without rupture: Secondary | ICD-10-CM | POA: Diagnosis not present

## 2016-03-02 DIAGNOSIS — Z823 Family history of stroke: Secondary | ICD-10-CM | POA: Insufficient documentation

## 2016-03-02 DIAGNOSIS — Z833 Family history of diabetes mellitus: Secondary | ICD-10-CM | POA: Insufficient documentation

## 2016-03-02 DIAGNOSIS — I2510 Atherosclerotic heart disease of native coronary artery without angina pectoris: Secondary | ICD-10-CM | POA: Insufficient documentation

## 2016-03-02 DIAGNOSIS — Z882 Allergy status to sulfonamides status: Secondary | ICD-10-CM | POA: Insufficient documentation

## 2016-03-02 DIAGNOSIS — Z881 Allergy status to other antibiotic agents status: Secondary | ICD-10-CM | POA: Diagnosis not present

## 2016-03-02 DIAGNOSIS — Z808 Family history of malignant neoplasm of other organs or systems: Secondary | ICD-10-CM | POA: Insufficient documentation

## 2016-03-02 LAB — VAS US CAROTID
LCCAPDIAS: 20 cm/s
LEFT ECA DIAS: -9 cm/s
LEFT VERTEBRAL DIAS: -12 cm/s
LICADSYS: -57 cm/s
LICAPDIAS: -19 cm/s
LICAPSYS: -52 cm/s
Left CCA dist dias: 13 cm/s
Left CCA dist sys: 75 cm/s
Left CCA prox sys: 94 cm/s
Left ICA dist dias: -19 cm/s
RCCAPDIAS: 11 cm/s
RCCAPSYS: 70 cm/s
RIGHT CCA MID DIAS: 11 cm/s
RIGHT VERTEBRAL DIAS: 11 cm/s
Right cca dist sys: -59 cm/s

## 2016-03-02 NOTE — Patient Instructions (Signed)
Stroke Prevention Some medical conditions and behaviors are associated with an increased chance of having a stroke. You may prevent a stroke by making healthy choices and managing medical conditions. How can I reduce my risk of having a stroke?  Stay physically active. Get at least 30 minutes of activity on most or all days.  Do not smoke. It may also be helpful to avoid exposure to secondhand smoke.  Limit alcohol use. Moderate alcohol use is considered to be:  No more than 2 drinks per day for men.  No more than 1 drink per day for nonpregnant women.  Eat healthy foods. This involves:  Eating 5 or more servings of fruits and vegetables a day.  Making dietary changes that address high blood pressure (hypertension), high cholesterol, diabetes, or obesity.  Manage your cholesterol levels.  Making food choices that are high in fiber and low in saturated fat, trans fat, and cholesterol may control cholesterol levels.  Take any prescribed medicines to control cholesterol as directed by your health care provider.  Manage your diabetes.  Controlling your carbohydrate and sugar intake is recommended to manage diabetes.  Take any prescribed medicines to control diabetes as directed by your health care provider.  Control your hypertension.  Making food choices that are low in salt (sodium), saturated fat, trans fat, and cholesterol is recommended to manage hypertension.  Ask your health care provider if you need treatment to lower your blood pressure. Take any prescribed medicines to control hypertension as directed by your health care provider.  If you are 18-39 years of age, have your blood pressure checked every 3-5 years. If you are 40 years of age or older, have your blood pressure checked every year.  Maintain a healthy weight.  Reducing calorie intake and making food choices that are low in sodium, saturated fat, trans fat, and cholesterol are recommended to manage  weight.  Stop drug abuse.  Avoid taking birth control pills.  Talk to your health care provider about the risks of taking birth control pills if you are over 35 years old, smoke, get migraines, or have ever had a blood clot.  Get evaluated for sleep disorders (sleep apnea).  Talk to your health care provider about getting a sleep evaluation if you snore a lot or have excessive sleepiness.  Take medicines only as directed by your health care provider.  For some people, aspirin or blood thinners (anticoagulants) are helpful in reducing the risk of forming abnormal blood clots that can lead to stroke. If you have the irregular heart rhythm of atrial fibrillation, you should be on a blood thinner unless there is a good reason you cannot take them.  Understand all your medicine instructions.  Make sure that other conditions (such as anemia or atherosclerosis) are addressed. Get help right away if:  You have sudden weakness or numbness of the face, arm, or leg, especially on one side of the body.  Your face or eyelid droops to one side.  You have sudden confusion.  You have trouble speaking (aphasia) or understanding.  You have sudden trouble seeing in one or both eyes.  You have sudden trouble walking.  You have dizziness.  You have a loss of balance or coordination.  You have a sudden, severe headache with no known cause.  You have new chest pain or an irregular heartbeat. Any of these symptoms may represent a serious problem that is an emergency. Do not wait to see if the symptoms will go away.   Get medical help at once. Call your local emergency services (911 in [place] Do not drive yourself to the hospital.  This information is not intended to replace advice given to you by your health care provider. Make sure you discuss any questions you have with your health care provider. Document Released: 04/15/2004 Document Revised: 08/14/2015 Document Reviewed: 09/08/2012 Elsevier  Interactive Patient Education  2017 Elsevier Inc.     Steps to Quit Smoking Smoking tobacco can be bad for your health. It can also affect almost every organ in your body. Smoking puts you and people around you at risk for many serious long-lasting (chronic) diseases. Quitting smoking is hard, but it is one of the best things that you can do for your health. It is never too late to quit. What are the benefits of quitting smoking? When you quit smoking, you lower your risk for getting serious diseases and conditions. They can include:  Lung cancer or lung disease.  Heart disease.  Stroke.  Heart attack.  Not being able to have children (infertility).  Weak bones (osteoporosis) and broken bones (fractures). If you have coughing, wheezing, and shortness of breath, those symptoms may get better when you quit. You may also get sick less often. If you are pregnant, quitting smoking can help to lower your chances of having a baby of low birth weight. What can I do to help me quit smoking? Talk with your doctor about what can help you quit smoking. Some things you can do (strategies) include:  Quitting smoking totally, instead of slowly cutting back how much you smoke over a period of time.  Going to in-person counseling. You are more likely to quit if you go to many counseling sessions.  Using resources and support systems, such as:  Online chats with a counselor.  Phone quitlines.  Printed self-help materials.  Support groups or group counseling.  Text messaging programs.  Mobile phone apps or applications.  Taking medicines. Some of these medicines may have nicotine in them. If you are pregnant or breastfeeding, do not take any medicines to quit smoking unless your doctor says it is okay. Talk with your doctor about counseling or other things that can help you. Talk with your doctor about using more than one strategy at the same time, such as taking medicines while you are  also going to in-person counseling. This can help make quitting easier. What things can I do to make it easier to quit? Quitting smoking might feel very hard at first, but there is a lot that you can do to make it easier. Take these steps:  Talk to your family and friends. Ask them to support and encourage you.  Call phone quitlines, reach out to support groups, or work with a counselor.  Ask people who smoke to not smoke around you.  Avoid places that make you want (trigger) to smoke, such as:  Bars.  Parties.  Smoke-break areas at work.  Spend time with people who do not smoke.  Lower the stress in your life. Stress can make you want to smoke. Try these things to help your stress:  Getting regular exercise.  Deep-breathing exercises.  Yoga.  Meditating.  Doing a body scan. To do this, close your eyes, focus on one area of your body at a time from head to toe, and notice which parts of your body are tense. Try to relax the muscles in those areas.  Download or buy apps on your mobile phone or tablet   that can help you stick to your quit plan. There are many free apps, such as QuitGuide from the CDC (Centers for Disease Control and Prevention). You can find more support from smokefree.gov and other websites. This information is not intended to replace advice given to you by your health care provider. Make sure you discuss any questions you have with your health care provider. Document Released: 01/02/2009 Document Revised: 11/04/2015 Document Reviewed: 07/23/2014 Elsevier Interactive Patient Education  2017 Elsevier Inc.  

## 2016-03-02 NOTE — Telephone Encounter (Signed)
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 She will leave this to MD to discuss on 12/15. However, she does not want MD to let patient know that she is already aware of this. Reassured her this will not occur.  Education -  Interventions -  Referrals -  Coordination of Care -  Education Method -  Support Groups/Services -  Acuity -  Time Spent with Patient -

## 2016-03-02 NOTE — Progress Notes (Signed)
Chief Complaint: Follow up Extracranial Carotid Artery Stenosis   History of Present Illness  [name] is a 75 y.o. male patient of Dr. [name] who presents for scheduled follow up of bilateral carotid endarterectomies. Initially this was the right carotid in 2002 and he underwent left carotid endarterectomy in September of 2013.   His PMHx is significant for 8 cardiac stents. Unfortunately he has resumed smoking, but he is now motivated to quit, has tried Chantix, caused overly vivid dreams, he does not want to have suicidal feelings that Chantix may cause. Dr. [name] is his cardiologist.   He had a TIA perioperatively in 2013 and states the numbness in his right 5th finger is about the same, no other neurological deficits. The patient denies amaurosis fugax or monocular blindness. The patient denies facial drooping. He denies any further stroke or TIA symptoms.  Pt. denies hemiplegia. The patient denies receptive or expressive aphasia.   He denies claudication sx's with walking, denies non healing wounds.    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 More recently diagnosed with esophogeal cancer, contemplating chemo and/or radiation tx.  Tomorrow he will have a biopsy of a spot noted on his right hip. He finished radiation treatment for prostate cancer in 2007.   Pt Diabetic: No  Pt smoker: smoker (3/4 ppd, decreased from 3 ppd, started in his teens), states he tried Chantix and cold [place] methods to quit  Pt meds include:  Statin : no, was stopped due to arthralgias  Betablocker: no ASA: no Other anticoagulants/antiplatelets: Plavix was stopped in preparation for right hip bx tomorrow, will resume afterward   Past Medical History:  Diagnosis Date  . AAA (abdominal aortic aneurysm) (HCC)    4.2 cm IR AAA noted on 01/13/15 PET scan  . Anxiety   . CAD (coronary artery disease)    AMI 1997 w/ BMS x 2 LAD,  BMS CFX 2002, DES x 2 LAD 2009, DES CFX & OM 2012   . Carotid artery disease ([place])    RICA CEA 7412, LICA CEA 8786 per Dr. [name],   . COPD (chronic obstructive pulmonary disease) ([place])   . GERD (gastroesophageal reflux disease)    occ  . HTN (hypertension)   . Hydradenitis   . Hyperlipidemia   . Ischemic cardiomyopathy    EF 40-45 percent at cath 2012  . Myocardial infarction   . Non-small cell lung cancer (NSCLC) ([place]) dx'd 12/2014   SBRT  . Osteoarthritis    pt. denies 01/17/2015  . PAC (premature atrial contraction)   . Peripheral vascular disease ([place])   . Pneumonia   . Prostate cancer ([place]) dx'd approx 2008   xrt throughfoley  . Radiation 02/18/15-02/25/15   right upper lung 54 Gy  . Shortness of breath dyspnea   . Stroke [facility]) 11-24-11   "no feeling in right 5th digit"  . Urinary frequency   . Urinary urgency     Social History Social History  Substance Use Topics  . Smoking status: Current Every Day Smoker    Packs/day: 0.50    Years: 55.00    Types: Cigarettes  . Smokeless tobacco: Never Used     Comment: 1/2 ppd (01/07/15)  . Alcohol use 0.0 oz/week     Comment: occ    Family History Family History  Problem Relation Age of Onset  . Stroke Father   . Heart disease Father   . Heart disease Mother  pacemaker  . Other Brother     benign brain tumor  . Cancer Brother     Eye-behind  . Cancer Sister     Breast  . Liver cancer Brother   . Diabetes Maternal Grandmother   . Colon polyps Neg Hx   . Esophageal cancer Neg Hx     Surgical History Past Surgical History:  Procedure Laterality Date  . CARDIAC CATHETERIZATION    . CARDIOVASCULAR STRESS TEST  12-2006  . CARDIOVERSION N/A 10/31/2015   Procedure: CARDIOVERSION;  Surgeon: Sueanne Margarita, MD;  Location: [facility];  Service: Cardiovascular;  Laterality: N/A;  . CAROTID ENDARTERECTOMY Right 03-2000   CE  . CAROTID ENDARTERECTOMY Left 11-24-11   CE  . COLONOSCOPY  04-2001   polyp  . [place], 2002, 2009, 2012   8 stents total  . [place]   neg  . ENDARTERECTOMY  11/24/2011   Procedure: ENDARTERECTOMY CAROTID;  Surgeon: Rosetta Posner, MD;  Location: [facility];  Service: Vascular;  Laterality: Left;  . EYE SURGERY Bilateral Aug. 17, 2015   Cataract  . FUDUCIAL PLACEMENT N/A 12/25/2014   Procedure: PLACEMENT OF FUDUCIAL;  Surgeon: Collene Gobble, MD;  Location: [facility];  Service: Thoracic;  Laterality: N/A;  . PROSTATE SURGERY     Radiation Tx  X's 40  . TEE WITHOUT CARDIOVERSION N/A 10/31/2015   Procedure: TRANSESOPHAGEAL ECHOCARDIOGRAM (TEE);  Surgeon: Sueanne Margarita, MD;  Location: [facility];  Service: Cardiovascular;  Laterality: N/A;  . VASCULAR SURGERY    . VIDEO BRONCHOSCOPY WITH ENDOBRONCHIAL NAVIGATION N/A 12/25/2014   Procedure: VIDEO BRONCHOSCOPY WITH ENDOBRONCHIAL NAVIGATION  with Biopsies and Brushings;  Surgeon: Collene Gobble, MD;  Location: [facility];  Service: Thoracic;  Laterality: N/A;  . VIDEO BRONCHOSCOPY WITH ENDOBRONCHIAL ULTRASOUND N/A 01/22/2015   Procedure: VIDEO BRONCHOSCOPY WITH ENDOBRONCHIAL ULTRASOUND with Biopsies;  Surgeon: Collene Gobble, MD;  Location: [facility];  Service: Thoracic;  Laterality: N/A;    Allergies  Allergen Reactions  . Augmentin [Amoxicillin-Pot Clavulanate] Diarrhea, Nausea And Vomiting and Other (See Comments)    Has patient had a PCN reaction causing immediate rash, facial/tongue/throat swelling, SOB or lightheadedness with hypotension: No Has patient had a PCN reaction causing severe rash involving mucus membranes or skin necrosis: No Has patient had a PCN reaction that required hospitalization No Has patient had a PCN reaction occurring within the last 10 years: Yes If all of the above answers are "NO", then may proceed with Cephalosporin use.   . Chantix [Varenicline] Other (See Comments)    Violent nightmares  . Sulfonamide Derivatives Other (See Comments)    Pt do not remember 8-30 md  .  Zantac [Ranitidine Hcl] Diarrhea    Current Outpatient Prescriptions  Medication Sig Dispense Refill  . acetaminophen (TYLENOL) 500 MG tablet Take 1,000 mg by mouth every 4 (four) hours as needed for mild pain, moderate pain, fever or headache.     . albuterol (PROVENTIL HFA;VENTOLIN HFA) 108 (90 Base) MCG/ACT inhaler Inhale 2 puffs into the lungs every 4 (four) hours as needed for wheezing or shortness of breath. 1 Inhaler 5  . ALPRAZolam (XANAX) 0.5 MG tablet Take 1 tablet (0.5 mg total) by mouth at bedtime as needed for anxiety or sleep. (Patient taking differently: Take 0.25-0.5 mg by mouth daily as needed for anxiety or sleep. ) 30 tablet 0  . clopidogrel (PLAVIX) 75 MG tablet Take 1 tablet (75 mg total) by  mouth daily. 30 tablet 11  . ezetimibe (ZETIA) 10 MG tablet TAKE 1 TABLET (10 MG TOTAL) BY MOUTH DAILY. 30 tablet 11  . fludrocortisone (FLORINEF) 0.1 MG tablet Take 1 tablet (0.1 mg total) by mouth daily. 30 tablet 5  . ipratropium-albuterol (DUONEB) 0.5-2.5 (3) MG/3ML SOLN Take 3 mLs by nebulization every 4 (four) hours as needed (shortness of breath and wheezing).    Marland Kitchen lidocaine (XYLOCAINE) 2 % solution 10 mL PO every 4 hours before meals and bedtime, and as needed (Patient taking differently: Use as directed 10 mLs in the mouth or throat every 4 (four) hours as needed for mouth pain. Usually uses before meals and at bedtime.) 1200 mL 1  . loratadine (CLARITIN) 10 MG tablet TAKE 1 TABLET BY MOUTH EVERY DAY (Patient taking differently: TAKE 1 TABLET BY MOUTH DAILY AT BEDTIME) 30 tablet 5  . mirtazapine (REMERON) 15 MG tablet Take 1 tablet (15 mg total) by mouth at bedtime. 90 tablet 3  . Multiple Vitamin (MULTIVITAMIN WITH MINERALS) TABS tablet Take 1 tablet by mouth daily.    . nitroGLYCERIN (NITROSTAT) 0.4 MG SL tablet Place 0.4 mg under the tongue every 5 (five) minutes as needed for chest pain.    Marland Kitchen omeprazole (PRILOSEC) 40 MG capsule Take 1 tablet 30 minutes before breakfast and  dinner (Patient taking differently: Take 40 mg by mouth 2 (two) times daily before a meal. Take 1 tablet 30 minutes before breakfast and dinner) 60 capsule 11  . polyethylene glycol powder (MIRALAX) powder Take 17 g by mouth daily.    Marland Kitchen tiotropium (SPIRIVA) 18 MCG inhalation capsule Place 1 capsule (18 mcg total) into inhaler and inhale daily. 30 capsule 5  . Fluticasone-Salmeterol (ADVAIR DISKUS) 500-50 MCG/DOSE AEPB Inhale 1 puff into the lungs 2 (two) times daily. 60 each 5   No current facility-administered medications for this visit.     Review of Systems : See HPI for pertinent positives and negatives.  Physical Examination  Vitals:   03/02/16 1330  BP: 125/71  Pulse: 93  Resp: 16  Temp: 97 F (36.1 C)  SpO2: 99%  Weight: 134 lb (60.8 kg)   Body mass index is 20.37 kg/m.  General: WDWN thin male in NAD  GAIT: normal  Eyes: PERRLA  Pulmonary: Limited air movement in right fields, no rales, rhonchi, or wheezes. + chronic moist cough.  Cardiac: regular rhythm, no detected murmur, no peripheral edema.  VASCULAR EXAM  Carotid Bruits  Left  Right    Negative  Negative   Aorta is not palpable.  Bilateral radial pulses are palpable at 2+.   LE Pulses  LEFT  RIGHT   POPLITEAL  not palpable  1+ palpable   POSTERIOR TIBIAL  2+palpable  2+palpable   DORSALIS PEDIS  ANTERIOR TIBIAL  not palpable  not palpable    Gastrointestinal: soft, nontender, BS WNL, no r/g, no palpalbe masses.  Musculoskeletal: No muscle atrophy/wasting. M/S 5/5 throughout, extremities without ischemic changes.  Neurologic: A&O X 3; Appropriate Affect,  Speech is normal  CN 2-12 intact, Pain and light touch intact in extremities, Motor exam as listed above     Assessment: [name] is a 75 y.o. male who is s/p right CEA in 2002 and left CEA in 2013. He had a TIA perioperatively in 2013 and states the numbness in his right 5th finger is about the same, no other  neurological deficits. He denies any further stroke or TIA symptoms.  He was recently  diagnosed with esophogeal cancer. Unfortunately he continues to smoke since his teens but has decreased to 3/4 ppd.   DATA  Today's carotid duplex suggests widely patent bilateral carotid endarterectomy with no restenosis. Bilateral vertebral artery is antegrade. No significant change compared to last exam on 02-21-15.   Plan:  The patient was counseled re smoking cessation and given several free resources re smoking cessation.   Follow-up in 1 year with Carotid Duplex scan.   I discussed in depth with the patient the nature of atherosclerosis, and emphasized the importance of maximal medical management including strict control of blood pressure, blood glucose, and lipid levels, obtaining regular exercise, and cessation of smoking.  The patient is aware that without maximal medical management the underlying atherosclerotic disease process will progress, limiting the benefit of any interventions. The patient was given information about stroke prevention and what symptoms should prompt the patient to seek immediate medical care. Thank you for allowing [place] to participate in this patient's care.  Clemon Chambers, RN, MSN, FNP-C [facility] Office: [phone]  Clinic Physician: Early  03/02/16 1:36 PM

## 2016-03-03 ENCOUNTER — Encounter (HOSPITAL_COMMUNITY): Payer: Self-pay

## 2016-03-03 ENCOUNTER — Ambulatory Visit (HOSPITAL_COMMUNITY)
Admission: RE | Admit: 2016-03-03 | Discharge: 2016-03-03 | Disposition: A | Discharge status: Home / Self Care | Payer: Medicare Other | Source: Ambulatory Visit | Attending: Oncology | Admitting: Oncology

## 2016-03-03 DIAGNOSIS — Z881 Allergy status to other antibiotic agents status: Secondary | ICD-10-CM | POA: Diagnosis not present

## 2016-03-03 DIAGNOSIS — Z85118 Personal history of other malignant neoplasm of bronchus and lung: Secondary | ICD-10-CM | POA: Diagnosis not present

## 2016-03-03 DIAGNOSIS — I491 Atrial premature depolarization: Secondary | ICD-10-CM | POA: Diagnosis not present

## 2016-03-03 DIAGNOSIS — C155 Malignant neoplasm of lower third of esophagus: Secondary | ICD-10-CM | POA: Diagnosis not present

## 2016-03-03 DIAGNOSIS — I255 Ischemic cardiomyopathy: Secondary | ICD-10-CM | POA: Diagnosis not present

## 2016-03-03 DIAGNOSIS — I252 Old myocardial infarction: Secondary | ICD-10-CM | POA: Diagnosis not present

## 2016-03-03 DIAGNOSIS — I739 Peripheral vascular disease, unspecified: Secondary | ICD-10-CM | POA: Diagnosis not present

## 2016-03-03 DIAGNOSIS — Z9221 Personal history of antineoplastic chemotherapy: Secondary | ICD-10-CM | POA: Diagnosis not present

## 2016-03-03 DIAGNOSIS — J449 Chronic obstructive pulmonary disease, unspecified: Secondary | ICD-10-CM | POA: Diagnosis not present

## 2016-03-03 DIAGNOSIS — Z8673 Personal history of transient ischemic attack (TIA), and cerebral infarction without residual deficits: Secondary | ICD-10-CM | POA: Diagnosis not present

## 2016-03-03 DIAGNOSIS — Z9889 Other specified postprocedural states: Secondary | ICD-10-CM | POA: Diagnosis not present

## 2016-03-03 DIAGNOSIS — K219 Gastro-esophageal reflux disease without esophagitis: Secondary | ICD-10-CM | POA: Diagnosis not present

## 2016-03-03 DIAGNOSIS — M1990 Unspecified osteoarthritis, unspecified site: Secondary | ICD-10-CM | POA: Diagnosis not present

## 2016-03-03 DIAGNOSIS — E785 Hyperlipidemia, unspecified: Secondary | ICD-10-CM | POA: Diagnosis not present

## 2016-03-03 DIAGNOSIS — C7951 Secondary malignant neoplasm of bone: Secondary | ICD-10-CM | POA: Diagnosis not present

## 2016-03-03 DIAGNOSIS — I2510 Atherosclerotic heart disease of native coronary artery without angina pectoris: Secondary | ICD-10-CM | POA: Diagnosis not present

## 2016-03-03 DIAGNOSIS — I714 Abdominal aortic aneurysm, without rupture: Secondary | ICD-10-CM | POA: Diagnosis not present

## 2016-03-03 DIAGNOSIS — Z882 Allergy status to sulfonamides status: Secondary | ICD-10-CM | POA: Diagnosis not present

## 2016-03-03 DIAGNOSIS — C801 Malignant (primary) neoplasm, unspecified: Secondary | ICD-10-CM | POA: Diagnosis not present

## 2016-03-03 DIAGNOSIS — M899 Disorder of bone, unspecified: Secondary | ICD-10-CM | POA: Diagnosis not present

## 2016-03-03 DIAGNOSIS — Z923 Personal history of irradiation: Secondary | ICD-10-CM | POA: Diagnosis not present

## 2016-03-03 DIAGNOSIS — I10 Essential (primary) hypertension: Secondary | ICD-10-CM | POA: Diagnosis not present

## 2016-03-03 LAB — CBC WITH DIFFERENTIAL/PLATELET
BASOS ABS: 0 10*3/uL (ref 0.0–0.1)
BASOS PCT: 0 %
EOS ABS: 0.1 10*3/uL (ref 0.0–0.7)
EOS PCT: 1 %
HCT: 29.7 % — ABNORMAL LOW (ref 39.0–52.0)
Hemoglobin: 9.5 g/dL — ABNORMAL LOW (ref 13.0–17.0)
LYMPHS ABS: 0.8 10*3/uL (ref 0.7–4.0)
Lymphocytes Relative: 8 %
MCH: 28.2 pg (ref 26.0–34.0)
MCHC: 32 g/dL (ref 30.0–36.0)
MCV: 88.1 fL (ref 78.0–100.0)
Monocytes Absolute: 0.6 10*3/uL (ref 0.1–1.0)
Monocytes Relative: 6 %
Neutro Abs: 9.4 10*3/uL — ABNORMAL HIGH (ref 1.7–7.7)
Neutrophils Relative %: 85 %
PLATELETS: 335 10*3/uL (ref 150–400)
RBC: 3.37 MIL/uL — AB (ref 4.22–5.81)
RDW: 14.7 % (ref 11.5–15.5)
WBC: 11 10*3/uL — AB (ref 4.0–10.5)

## 2016-03-03 LAB — BASIC METABOLIC PANEL
Anion gap: 7 (ref 5–15)
BUN: 12 mg/dL (ref 6–20)
CALCIUM: 8.9 mg/dL (ref 8.9–10.3)
CO2: 27 mmol/L (ref 22–32)
CREATININE: 0.87 mg/dL (ref 0.61–1.24)
Chloride: 103 mmol/L (ref 101–111)
GFR calc Af Amer: >60 mL/min (ref >60)
Glucose, Bld: 119 mg/dL — ABNORMAL HIGH (ref 65–99)
Potassium: 3.5 mmol/L (ref 3.5–5.1)
SODIUM: 137 mmol/L (ref 135–145)

## 2016-03-03 LAB — PROTIME-INR
INR: 1.13
PROTHROMBIN TIME: 14.5 s (ref 11.4–15.2)

## 2016-03-03 MED ORDER — MIDAZOLAM HCL 2 MG/2ML IJ SOLN
INTRAMUSCULAR | Status: AC
  Filled 2016-03-03: qty 2

## 2016-03-03 MED ORDER — SODIUM CHLORIDE 0.9 % IV SOLN: INTRAVENOUS | Status: DC

## 2016-03-03 MED ORDER — LIDOCAINE HCL (PF) 1 % IJ SOLN
INTRAMUSCULAR | Status: AC
  Filled 2016-03-03: qty 30

## 2016-03-03 MED ORDER — FENTANYL CITRATE (PF) 100 MCG/2ML IJ SOLN
INTRAMUSCULAR | Status: AC | PRN
  Administered 2016-03-03 (×2): 25 ug via INTRAVENOUS

## 2016-03-03 MED ORDER — MIDAZOLAM HCL 2 MG/2ML IJ SOLN
INTRAMUSCULAR | Status: AC | PRN
  Administered 2016-03-03 (×2): 0.5 mg via INTRAVENOUS

## 2016-03-03 MED ORDER — FENTANYL CITRATE (PF) 100 MCG/2ML IJ SOLN
INTRAMUSCULAR | Status: AC
  Filled 2016-03-03: qty 2

## 2016-03-03 NOTE — Sedation Documentation (Signed)
Patient is resting comfortably. No complaints at this time, pt is sleeping.

## 2016-03-03 NOTE — Sedation Documentation (Signed)
Biopsies taken °

## 2016-03-03 NOTE — Procedures (Signed)
Lytic met left iliac crest  S/P CT BX LEFT ILIAC LYTIC LESION  NO COMP STABLE PATH PENDING FULL REPORT IN PACS

## 2016-03-03 NOTE — Sedation Documentation (Signed)
Patient is resting comfortably. Vitals stable

## 2016-03-03 NOTE — Progress Notes (Signed)
Pt tolerated procedure very well. Pt in radiology rn station at this time. Report given to [facility] RN. Pt will be transported to [facility]

## 2016-03-03 NOTE — Discharge Instructions (Signed)
Needle Biopsy, Care After °Introduction °These instructions give you information about caring for yourself after your procedure. Your doctor may also give you more specific instructions. Call your doctor if you have any problems or questions after your procedure. °Follow these instructions at home: °· Rest as told by your doctor. °· Take medicines only as told by your doctor. °· There are many different ways to close and cover the biopsy site, including stitches (sutures), skin glue, and adhesive strips. Follow instructions from your doctor about: °¨ How to take care of your biopsy site. °¨ When and how you should change your bandage (dressing). °¨ When you should remove your dressing. °¨ Removing whatever was used to close your biopsy site. °· Check your biopsy site every day for signs of infection. Watch for: °¨ Redness, swelling, or pain. °¨ Fluid, blood, or pus. °Contact a doctor if: °· You have a fever. °· You have redness, swelling, or pain at the biopsy site, and it lasts longer than a few days. °· You have fluid, blood, or pus coming from the biopsy site. °· You feel sick to your stomach (nauseous). °· You throw up (vomit). °Get help right away if: °· You are short of breath. °· You have trouble breathing. °· Your chest hurts. °· You feel dizzy or you pass out (faint). °· You have bleeding that does not stop with pressure or a bandage. °· You cough up blood. °· Your belly (abdomen) hurts. °This information is not intended to replace advice given to you by your health care provider. Make sure you discuss any questions you have with your health care provider. °Document Released: 02/19/2008 Document Revised: 08/14/2015 Document Reviewed: 03/04/2014 °© 2017 [employer] ° °

## 2016-03-03 NOTE — Sedation Documentation (Signed)
Patient is resting comfortably. 

## 2016-03-03 NOTE — H&P (Signed)
Chief Complaint: Patient was seen in consultation today for left iliac bone lesion biopsy at the request of Sherrill,Gary B  Referring Physician(s): Ladell Pier  Supervising Physician: Daryll Brod  Patient Status: [facility] - Out-pt  History of Present Illness: [name] is a 75 y.o. 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 Intraluminal mass in the distal esophagus with associated wall thickening as shown on recent esophagram. Adenopathy in the gastrohepatic ligament. 2. New osseous metastatic disease with a large lytic lesion in the left iliac bone with extraosseous extension; a smaller lytic lesion in the right iliac bone; and scattered faintly sclerotic lesions in the lower thoracic and lumbar spine concerning for osseous metastatic disease as well. 3. In the lung bases there are several nonspecific nodules including a 1.7 cm sub solid nodule in the left lower lobe which could reflect malignancy. 4. Small right pleural effusion, nonspecific for transudative or exudative etiology, with adjacent rib fracture nonunion. 5. Infrarenal abdominal aortic aneurysm, has been slowly enlarging, current size 4.4 cm. Recommend followup by ultrasound in 1 year. This recommendation follows ACR consensus guidelines: White Paper of the ACR Incidental Findings Committee II on Vascular Findings. J Am Coll Radiol 2013; 10:789-794. 6. New retroperitoneal adenopathy including and aortocaval node measuring 1.7 cm in short axis. 7. New small indirect right inguinal hernia containing a loop of small bowel without current findings of obstruction or Strangulation.  Scheduled today for left iliac bone lesion bx to help determine treatment plan per Dr Benay Spice  Past Medical History:  Diagnosis Date  . AAA (abdominal aortic aneurysm) (HCC)    4.2 cm IR AAA noted on 01/13/15  PET scan  . Anxiety   . CAD (coronary artery disease)    AMI 1997 w/ BMS x 2 LAD, BMS CFX 2002, DES x 2 LAD 2009, DES CFX & OM 2012   . Carotid artery disease ([place])    RICA CEA 0240, LICA CEA 9735 per Dr. [name],   . COPD (chronic obstructive pulmonary disease) ([place])   . GERD (gastroesophageal reflux disease)    occ  . HTN (hypertension)   . Hydradenitis   . Hyperlipidemia   . Ischemic cardiomyopathy    EF 40-45 percent at cath 2012  . Myocardial infarction   . Non-small cell lung cancer (NSCLC) ([place]) dx'd 12/2014   SBRT  . Osteoarthritis    pt. denies 01/17/2015  . PAC (premature atrial contraction)   . Peripheral vascular disease ([place])   . Pneumonia   . Prostate cancer ([place]) dx'd approx 2008   xrt throughfoley  . Radiation 02/18/15-02/25/15   right upper lung 54 Gy  . Shortness of breath dyspnea   . Stroke [facility]) 11-24-11   "no feeling in right 5th digit"  . Urinary frequency   . Urinary urgency     Past Surgical History:  Procedure Laterality Date  . CARDIAC CATHETERIZATION    . CARDIOVASCULAR STRESS TEST  12-2006  . CARDIOVERSION N/A 10/31/2015   Procedure: CARDIOVERSION;  Surgeon: Sueanne Margarita, MD;  Location: [place];  Service: Cardiovascular;  Laterality: N/A;  . CAROTID ENDARTERECTOMY Right 03-2000   CE  . CAROTID ENDARTERECTOMY Left 11-24-11   CE  . COLONOSCOPY  04-2001   polyp  . [place], 2002, 2009, 2012   8 stents total  . [place]  neg  . ENDARTERECTOMY  11/24/2011   Procedure: ENDARTERECTOMY CAROTID;  Surgeon: Rosetta Posner, MD;  Location: [place];  Service: Vascular;  Laterality: Left;  . EYE SURGERY Bilateral Aug. 17, 2015   Cataract  . FUDUCIAL PLACEMENT N/A 12/25/2014   Procedure: PLACEMENT OF FUDUCIAL;  Surgeon: Collene Gobble, MD;  Location: [place];  Service: Thoracic;  Laterality: N/A;  . PROSTATE SURGERY     Radiation Tx  X's 40  . TEE WITHOUT CARDIOVERSION N/A 10/31/2015   Procedure: TRANSESOPHAGEAL  ECHOCARDIOGRAM (TEE);  Surgeon: Sueanne Margarita, MD;  Location[facility] ENDOSCOPY;  Service: Cardiovascular;  Laterality: N/A;  . VASCULAR SURGERY    . VIDEO BRONCHOSCOPY WITH ENDOBRONCHIAL NAVIGATION N/A 12/25/2014   Procedure: VIDEO BRONCHOSCOPY WITH ENDOBRONCHIAL NAVIGATION  with Biopsies and Brushings;  Surgeon: Collene Gobble, MD;  Location: [place];  Service: Thoracic;  Laterality: N/A;  . VIDEO BRONCHOSCOPY WITH ENDOBRONCHIAL ULTRASOUND N/A 01/22/2015   Procedure: VIDEO BRONCHOSCOPY WITH ENDOBRONCHIAL ULTRASOUND with Biopsies;  Surgeon: Collene Gobble, MD;  Location: [facility] OR;  Service: Thoracic;  Laterality: N/A;    Allergies: Augmentin [amoxicillin-pot clavulanate]; Chantix [varenicline]; Sulfonamide derivatives; and Zantac [ranitidine hcl]  Medications: Prior to Admission medications   Medication Sig Start Date End Date Taking? Authorizing Provider  acetaminophen (TYLENOL) 500 MG tablet Take 1,000 mg by mouth every 4 (four) hours as needed for mild pain, moderate pain, fever or headache.  10/17/15  Yes Historical Provider, MD  albuterol (PROVENTIL HFA;VENTOLIN HFA) 108 (90 Base) MCG/ACT inhaler Inhale 2 puffs into the lungs every 4 (four) hours as needed for wheezing or shortness of breath. 02/24/16  Yes Tammy S Parrett, NP  ALPRAZolam (XANAX) 0.5 MG tablet Take 1 tablet (0.5 mg total) by mouth at bedtime as needed for anxiety or sleep. Patient taking differently: Take 0.25-0.5 mg by mouth daily as needed for anxiety or sleep.  01/26/16  Yes Burnell Blanks, MD  ezetimibe (ZETIA) 10 MG tablet TAKE 1 TABLET (10 MG TOTAL) BY MOUTH DAILY. 12/30/15  Yes Isaiah Serge, NP  Fluticasone-Salmeterol (ADVAIR DISKUS) 500-50 MCG/DOSE AEPB Inhale 1 puff into the lungs 2 (two) times daily. 02/24/16 02/25/16 Yes Tammy S Parrett, NP  ipratropium-albuterol (DUONEB) 0.5-2.5 (3) MG/3ML SOLN Take 3 mLs by nebulization every 4 (four) hours as needed (shortness of breath and wheezing).   Yes Historical Provider, MD    lidocaine (XYLOCAINE) 2 % solution 10 mL PO every 4 hours before meals and bedtime, and as needed Patient taking differently: Use as directed 10 mLs in the mouth or throat every 4 (four) hours as needed for mouth pain. Usually uses before meals and at bedtime. 02/17/16  Yes Ladene Artist, MD  loratadine (CLARITIN) 10 MG tablet TAKE 1 TABLET BY MOUTH EVERY DAY Patient taking differently: TAKE 1 TABLET BY MOUTH DAILY AT BEDTIME 01/19/16  Yes Collene Gobble, MD  mirtazapine (REMERON) 15 MG tablet Take 1 tablet (15 mg total) by mouth at bedtime. 02/20/16  Yes Abner Greenspan, MD  Multiple Vitamin (MULTIVITAMIN WITH MINERALS) TABS tablet Take 1 tablet by mouth daily.   Yes Historical Provider, MD  nitroGLYCERIN (NITROSTAT) 0.4 MG SL tablet Place 0.4 mg under the tongue every 5 (five) minutes as needed for chest pain.   Yes Historical Provider, MD  omeprazole (PRILOSEC) 40 MG capsule Take 1 tablet 30 minutes before breakfast and dinner Patient taking differently: Take 40 mg by mouth 2 (two) times daily before a meal. Take 1 tablet 30  minutes before breakfast and dinner 02/17/16  Yes Ladene Artist, MD  polyethylene glycol powder (MIRALAX) powder Take 17 g by mouth daily.   Yes Historical Provider, MD  tiotropium (SPIRIVA) 18 MCG inhalation capsule Place 1 capsule (18 mcg total) into inhaler and inhale daily. 02/24/16  Yes Tammy S Parrett, NP  clopidogrel (PLAVIX) 75 MG tablet Take 1 tablet (75 mg total) by mouth daily. 11/26/15   Burnell Blanks, MD  fludrocortisone (FLORINEF) 0.1 MG tablet Take 1 tablet (0.1 mg total) by mouth daily. 03/01/16   Abner Greenspan, MD     Family History  Problem Relation Age of Onset  . Stroke Father   . Heart disease Father   . Heart disease Mother     pacemaker  . Other Brother     benign brain tumor  . Cancer Brother     Eye-behind  . Cancer Sister     Breast  . Liver cancer Brother   . Diabetes Maternal Grandmother   . Colon polyps Neg Hx   . Esophageal  cancer Neg Hx     Social History   Social History  . Marital status: Widowed    Spouse name: N/A  . Number of children: 5  . Years of education: N/A   Occupational History  . Retired Airline pilot   . Part time, delivering auto [employer]   Social History Main Topics  . Smoking status: Current Every Day Smoker    Packs/day: 0.50    Years: 55.00    Types: Cigarettes  . Smokeless tobacco: Never Used     Comment: 1/2 ppd (01/07/15)  . Alcohol use 0.0 oz/week     Comment: occ  . Drug use: No  . Sexual activity: Not Asked   Other Topics Concern  . None   Social History Narrative   Widowed, lives next door to his step-daughter, Antony Haste   Independent in ADLs and drives   Prior worked delivering car parts    [place] discussed and pertinent positives are indicated in the HPI above.  All other systems are negative.  Review of Systems  Constitutional: Positive for activity change, appetite change, fatigue and unexpected weight change. Negative for fever.  HENT: Positive for sore throat and trouble swallowing.   Respiratory: Negative for cough and shortness of breath.   Cardiovascular: Negative for chest pain.  Gastrointestinal: Negative for abdominal pain.  Musculoskeletal: Positive for gait problem.  Neurological: Positive for weakness.  Psychiatric/Behavioral: Positive for behavioral problems and confusion.    Vital Signs: BP (!) 109/54   Pulse 85   Temp 97.8 F (36.6 C)   Resp 20   Ht '5\' 8"'$  (1.727 m)   Wt 135 lb (61.2 kg)   SpO2 100%   BMI 20.53 kg/m   Physical Exam  Constitutional: He is oriented to person, place, and time.  Cardiovascular: Normal rate, regular rhythm and normal heart sounds.   Pulmonary/Chest: Effort normal and breath sounds normal. He has no wheezes.  Abdominal: Soft. Bowel sounds are normal. There is no tenderness.  Musculoskeletal: Normal range of motion.  B Hip pain; Left more than Rt    Neurological: He is alert and oriented to person, place, and time.  Skin: Skin is warm and dry.  Psychiatric: He has a normal mood and affect. His behavior is normal. Judgment and thought content normal.  Nursing note and vitals reviewed.   Mallampati Score:  MD Evaluation Airway:  WNL Heart: WNL Abdomen: WNL Chest/ Lungs: WNL ASA  Classification: 3 Mallampati/Airway Score: One  Imaging: Ct Abdomen Pelvis W Contrast  Result Date: 02/24/2016 CLINICAL DATA:  Newly diagnosed esophageal cancer. Prostate cancer 10 years ago on lung cancer 2 years ago, prior chemotherapy and radiation therapy. Difficulty swallowing. EXAM: CT ABDOMEN AND PELVIS WITH CONTRAST TECHNIQUE: Multidetector CT imaging of the abdomen and pelvis was performed using the standard protocol following bolus administration of intravenous contrast. CONTRAST:  191m ISOVUE-300 IOPAMIDOL (ISOVUE-300) INJECTION 61% COMPARISON:  02/11/2016 and 01/13/2015 FINDINGS: Streak artifact from dense barium in the colon left over from the patient's esophagram of 02/11/2016, mildly obscures surrounding findings. Lower chest: Small right pleural effusion, nonspecific for transudative or exudative etiology. Distal esophageal wall thickening just above the level of an approximately 2.1 by 1.6 cm intraluminal mass, image 7/2. Mild atelectasis in the right lower lobe. Focal 1.7 by 1.7 cm region of sub solid irregular opacity in the left lower lobe on image 8/3, similar to prior. 4 mm anterior left lower lobe new slightly nodular 1.3 by 0.7 cm density in the left posterior costophrenic angle on image 11/3, given that this is new over the last month at most likely is due to atelectasis. Nodule, image 5/3. Hepatobiliary: 4 mm hypodense lesion in the right hepatic lobe on image 28/2, likely a tiny cysts but technically too small to characterize. Similar lesion posteriorly in the right hepatic lobe on image 22/2, 4 mm in diameter. Pancreas: Unremarkable Spleen:  Punctate calcification in the spleen, likely from old granulomatous disease. Adrenals/Urinary Tract: Stable large simple appearing cyst of the left kidney with adjacent smaller cyst and mild stranding in the perirenal space below the level of the cyst which is new compared to prior exams. Relatively empty urinary bladder. Stomach/Bowel: Dense barium left over in the colon from esophagram from over 10 days ago, possibly suggesting constipation or mild ileus. Mild sigmoid colon diverticulosis. Vascular/Lymphatic: Gastrohepatic ligament lymph nodes up to about 1 cm in short axis. Also there is a partially calcified 1.9 by 2.7 cm structure in the gastrohepatic ligament on image 13/2 which is not significantly hypermetabolic on prior PET-CT from 07/18/2014 and measured 2.5 by 1.5 cm by my measurements on that exam. Accordingly it appears slightly larger today. New aortocaval node measuring 1.7 cm in short axis on image 24/2, this was not present on 07/18/2014. Infrarenal abdominal aortic aneurysm, 4.4 cm anterior-posterior by 4.2 cm transverse on image 29/2, previously 4.2 by 3.7 cm on 07/18/2014. Aortoiliac atherosclerotic vascular disease. Reproductive: Fiducials around the prostate gland with prostate calcifications and indistinct margins of the relatively normal sized prostate gland. Other: No supplemental non-categorized findings. Musculoskeletal: Small indirect right inguinal hernia contains a loop of small bowel. No current findings of obstruction or strangulation. Old right rib fractures some of which are displaced in nonunited. New lytic destructive lesion of the left iliac bone, 3.8 cm by approximately 2.0 cm, cortical destruction with soft tissue extension along the left iliacus muscle. New 3 mm lytic lesion in the right iliac bone, image 45/2. Various lesions with faint sclerosis at observed at T11, T12, L3, L5, and S1, concerning for metastatic lesions. IMPRESSION: 1. Intraluminal mass in the distal  esophagus with associated wall thickening as shown on recent esophagram. Adenopathy in the gastrohepatic ligament. 2. New osseous metastatic disease with a large lytic lesion in the left iliac bone with extraosseous extension; a smaller lytic lesion in the right iliac bone; and scattered faintly sclerotic lesions in the  lower thoracic and lumbar spine concerning for osseous metastatic disease as well. 3. In the lung bases there are several nonspecific nodules including a 1.7 cm sub solid nodule in the left lower lobe which could reflect malignancy. 4. Small right pleural effusion, nonspecific for transudative or exudative etiology, with adjacent rib fracture nonunion. 5. Infrarenal abdominal aortic aneurysm, has been slowly enlarging, current size 4.4 cm. Recommend followup by ultrasound in 1 year. This recommendation follows ACR consensus guidelines: White Paper of the ACR Incidental Findings Committee II on Vascular Findings. J Am Coll Radiol 2013; 10:789-794. 6. New retroperitoneal adenopathy including and aortocaval node measuring 1.7 cm in short axis. 7. New small indirect right inguinal hernia containing a loop of small bowel without current findings of obstruction or strangulation. Electronically Signed   By: Van Clines M.D.   On: 02/24/2016 08:54   Dg Esophagus  Addendum Date: 02/11/2016   ADDENDUM REPORT: 02/11/2016 11:19 ADDENDUM: These results were called by telephone at the time of interpretation on 02/11/2016 at 11:19 am to [facility] , who verbally acknowledged these results. Electronically Signed   By: Rolm Baptise M.D.   On: 02/11/2016 11:19   Result Date: 02/11/2016 CLINICAL DATA:  Dysphagia. Food sticking in throat and upper chest for 3-4 months. Weight loss. Decreased appetite. EXAM: ESOPHOGRAM/BARIUM SWALLOW TECHNIQUE: Combined double contrast and single contrast examination performed using effervescent crystals, thick barium liquid, and thin barium liquid. FLUOROSCOPY TIME:   Fluoroscopy Time:  2 minutes 6 seconds Radiation Exposure Index (if provided by the fluoroscopic device): Number of Acquired Spot Images: 0 COMPARISON:  None. FINDINGS: Fluoroscopic evaluation of swallowing demonstrates disruption of 3 out of 3 primary esophageal peristaltic waves. There is an area of irregular narrowing in the distal esophagus. Below this area of narrowing, there is a persistent filling defect. Findings are worrisome for esophageal cancer. The degree of narrowing is moderate to severe. Given how tight narrowing is, the barium tablet was not administered. IMPRESSION: Area of irregular tight narrowing within the distal esophagus. Below this area, there is a large filling defect in the distal esophagus. Findings are concerning for esophageal cancer. Recommend direct visualization. Electronically Signed: By: Rolm Baptise M.D. On: 02/11/2016 11:00    Labs:  CBC:  Recent Labs  11/20/15 0034 01/23/16 1034 01/24/16 0530 01/30/16 1223  WBC 6.1 10.3 6.7 7.7  HGB 12.8* 12.5* 9.3* 9.3*  HCT 36.4* 35.5* 27.4* 27.6*  PLT 263 310 251 330    COAGS: No results for input(s): INR, APTT in the last 8760 hours.  BMP:  Recent Labs  11/19/15 1641 11/20/15 0034 11/21/15 1530 01/23/16 1034 01/24/16 0530 01/30/16 1224  NA 134*  --  134* 137 137  --   K 4.1  --  3.8 3.7 3.9  --   CL 98*  --  99* 102 107  --   CO2 26  --  '29 26 27  '$ --   GLUCOSE 100*  --  112* 112* 99  --   BUN 13  --  14 12 29* 10.5  CALCIUM 9.4  --  9.2 9.1 7.8*  --   CREATININE 0.82 0.67 0.83 0.89 0.71 0.8  GFRNONAA >60 >60 >60 >60 >60  --   GFRAA >60 >60 >60 >60 >60  --     LIVER FUNCTION TESTS:  Recent Labs  07/22/15 1013 10/30/15 1000 11/10/15 1730 01/23/16 1034  BILITOT 0.5 0.7 0.3 0.7  AST '14 19 18 19  '$ ALT '10 21 14 '$ 10*  ALKPHOS 44 80 68 66  PROT 6.8 6.6 6.7 6.9  ALBUMIN 4.1 3.6 3.8 3.7    TUMOR MARKERS: No results for input(s): AFPTM, CEA, CA199, CHROMGRNA in the last 8760  hours.  Assessment and Plan:  Hx prostate ca; lung ca; recent esophageal ca Left iliac bone lesion  Wt loss Scheduled for iliac bone lesion bx today Risks and Benefits discussed with the patient including, but not limited to bleeding, infection, damage to adjacent structures or low yield requiring additional tests. All of the patient's questions were answered, patient is agreeable to proceed. Consent signed and in chart.   Thank you for this interesting consult.  I greatly enjoyed meeting JOSHAUA EPPLE and look forward to participating in their care.  A copy of this report was sent to the requesting provider on this date.  Electronically Signed: Wava Kildow A 03/03/2016, 10:30 AM   I spent a total of  30 Minutes   in face to face in clinical consultation, greater than 50% of which was counseling/coordinating care for left iliac bone lesion bx

## 2016-03-05 ENCOUNTER — Ambulatory Visit (HOSPITAL_BASED_OUTPATIENT_CLINIC_OR_DEPARTMENT_OTHER): Payer: Medicare Other | Admitting: Oncology

## 2016-03-05 ENCOUNTER — Telehealth: Payer: Self-pay | Admitting: *Deleted

## 2016-03-05 VITALS — BP 116/59 | HR 95 | Temp 98.2°F | Resp 18 | Ht 68.0 in | Wt 134.5 lb

## 2016-03-05 DIAGNOSIS — Z8546 Personal history of malignant neoplasm of prostate: Secondary | ICD-10-CM | POA: Diagnosis not present

## 2016-03-05 DIAGNOSIS — C155 Malignant neoplasm of lower third of esophagus: Secondary | ICD-10-CM

## 2016-03-05 DIAGNOSIS — Z85118 Personal history of other malignant neoplasm of bronchus and lung: Secondary | ICD-10-CM | POA: Diagnosis not present

## 2016-03-05 DIAGNOSIS — C7951 Secondary malignant neoplasm of bone: Secondary | ICD-10-CM | POA: Diagnosis not present

## 2016-03-05 DIAGNOSIS — D649 Anemia, unspecified: Secondary | ICD-10-CM

## 2016-03-05 MED ORDER — TRAMADOL HCL 50 MG PO TABS: 50.0000 mg | ORAL_TABLET | Freq: Four times a day (QID) | ORAL | 0 refills | Status: DC | PRN

## 2016-03-05 NOTE — Progress Notes (Signed)
[place] OFFICE PROGRESS NOTE   Diagnosis: Esophagus cancer  INTERVAL HISTORY:   [name] returns as scheduled. No new complaint. He is tolerating liquids. He continues to have solid dysphagia. He reports mild discomfort in the left arm and "ribs ". He is not taking pain medication. He underwent a CT-guided biopsy of a left iliac lesion on 03/03/2016. The pathology returned positive for metastatic adenocarcinoma. 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   Objective:  Vital signs in last 24 hours:  Blood pressure (!) 116/59, pulse 95, temperature 98.2 F (36.8 C), temperature source Oral, resp. rate 18, height '5\' 8"'$  (1.727 m), weight 134 lb 8 oz (61 kg), SpO2 100 %.     Resp: Distant breath sounds, scattered rhonchi, decreased breath sounds and? Rub at the right lower posterior chest. No respiratory distress Cardio: Regular rate and rhythm GI: No hepatomegaly Vascular: No leg edema Musculoskeletal: No tenderness at the left upper arm     Lab Results:  Lab Results  Component Value Date   WBC 11.0 (H) 03/03/2016   HGB 9.5 (L) 03/03/2016   HCT 29.7 (L) 03/03/2016   MCV 88.1 03/03/2016   PLT 335 03/03/2016   NEUTROABS 9.4 (H) 03/03/2016     Imaging:  Ct Biopsy  Result Date: 03/03/2016 INDICATION: NEW LEFT ILIAC LYTIC DESTRUCTIVE BONE LESION COMPATIBLE WITH OSSEOUS METASTASIS. EXAM: CT-GUIDED BIOPSY LEFT ILIAC LYTIC DESTRUCTIVE BONE LESION MEDICATIONS: 1% lidocaine locally ANESTHESIA/SEDATION: 1.0 mg IV Versed; 50 mcg IV Fentanyl Moderate Sedation Time:  16 minutes The patient was continuously monitored during the procedure by the interventional radiology nurse under my direct supervision. PROCEDURE: The procedure, risks, benefits, and alternatives were explained to the patient. Questions regarding the procedure were encouraged and answered. The patient understands and consents to  the procedure. The left anterior pelvis was prepped with ChloraPrep in a sterile fashion, and a sterile drape was applied covering the operative field. A sterile gown and sterile gloves were used for the procedure. Previous imaging reviewed. Patient positioned supine. Noncontrast localization CT performed. The left iliac crest lytic destructive bone lesion was localized. Overlying skin marked. Under sterile conditions and local anesthesia, a 17 gauge 6.8 cm access needle was advanced percutaneously into the lesion. Needle position confirmed with CT. Several 18 gauge core biopsies obtained. Samples were fragmented and placed in formalin. Needle removed. Postprocedure imaging demonstrates no hemorrhage or hematoma. Patient tolerated the biopsy well. Vital sign monitoring by nursing staff during the procedure will continue as patient is in the special procedures unit for post procedure observation. FINDINGS: The images document guide needle placement within the left iliac crest lytic destructive bone lesion. Post biopsy images demonstrate no complication. COMPLICATIONS: None immediate. IMPRESSION: Successful CT-guided core biopsy of the left iliac crest lytic destructive bone lesion Electronically Signed   By: Jerilynn Mages.  Shick M.D.   On: 03/03/2016 12:37    Medications: I have reviewed the patient's current medications.  Assessment/Plan: 1. Adenocarcinoma the esophagus  upper endoscopy 02/17/2016 confirmed a distal esophagus mass  CT chest 01/30/2016-increased in size of bilateral lung nodules  CT abdomen/pelvis 02/23/2016 distal esophagus mass, gastrohepatic adenopathy, iliac and probable thoracic and lumbar metastases, 1.7 cm aortocaval node, nonspecific lung nodules  Biopsy of a left iliac lesion 03/03/2016 confirmed metastatic adenocarcinoma, TTF-1, cytokeratin 7, cytokeratin 20, and CDX-2 positive; morphology similar to the distal esophagus biopsy  2.   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   Prostate cancer treated with external beam radiation  4.   COPD  5.   Coronary artery disease  6.   Peripheral vascular disease  7.  Anemia    Disposition:  [name] has metastatic adenocarcinoma the esophagus. The left iliac biopsy confirmed metastatic adenocarcinoma, most consistent with the esophagus primary.  I discussed the prognosis and treatment options with [name] and his family. He understands no therapy will be curative. We discussed supportive care versus a trial of systemic therapy. We discussed the role for palliative radiation to help with dysphagia or if he developed painful metastases in the future.  He would like to proceed with a trial of chemotherapy. I recommend FOLFOX. I reviewed potential toxicities associated with the FOLFOX regimen including the chance for nausea/vomiting, mucositis, diarrhea, alopecia, and hematologic toxicity. We discussed the rash, sun sensitivity, hyperpigmentation, and hand/foot syndrome associated with 5 fluorouracil. We discussed the allergic reaction and various types of neuropathy associated with oxaliplatin.  [name] says it will be more convenient for him to be treated at the [facility]. He lives close to the [facility]. I will refer him to Dr. Rogue Bussing.  We discussed placement of a Port-A-Cath for the administration of chemotherapy. This can be placed at [facility] or I would be glad to arrange for placement of a Port-A-Cath here.  [name] was given a prescription for tramadol to use as needed for pain. He will seek medical attention if the left arm discomfort persists as this area has not been imaged.  He is not scheduled for a follow-up appointment at the [facility]. I am available to see him in the future as needed.   [name], MD  03/05/2016  4:07 PM Addendum:  We will  contacted by [name] after he left the office today. He has decided to proceed with treatment in [place]. We will arrange for placement of a Port-A-Cath and a first cycle of FOLFOX within the next one to 2 weeks.

## 2016-03-05 NOTE — Progress Notes (Signed)
START ON PATHWAY REGIMEN - Gastroesophageal  GEOS3: mFOLFOX6 q14 Days Until Progression or Unacceptable Toxicity   A cycle is every 14 days:     Oxaliplatin (Eloxatin(R)) 85 mg/m2 in 250 mL D5W IV over 2 hours day 1, q14 days Dose Mod: None     Leucovorin 400 mg/m2 in 250 mL D5W IV over 2 hours day 1, q14 days, followed immediately by Dose Mod: None     5-Fluorouracil 400 mg/m2 IV bolus over 2-4 minutes day 1, q14 days Dose Mod: None     5-Fluorouracil 2,400 mg/m2 in _____mL NS CIV as a 46 hour infusion starting on day 1, q14 days Dose Mod: None Additional Orders: **Note: order sheet contains two q14 day cycles**  **Always confirm dose/schedule in your pharmacy ordering system**    Patient Characteristics: Esophageal, Adenocarcinoma, Stage IV, Metastatic / Locally Recurrent Disease, First Line, HER2 Negative / Unknown Histology: Adenocarcinoma Disease Classification: Esophageal AJCC T Stage: X AJCC M Stage: 1 AJCC N Stage: X AJCC Stage Grouping: IV Current Disease Status: Distant Metastases Tumor Grade: X Line of therapy: First Line Would you be surprised if this patient died  in the next year? I would NOT be surprised if this patient died in the next year HER2 Status: Awaiting Test Results  Intent of Therapy: Non-Curative / Palliative Intent, Discussed with Patient

## 2016-03-05 NOTE — Telephone Encounter (Signed)
Message from [place] reporting pt has decided to have chemo in [place]. Called pt to confirm: he requests to proceed with port placement and treatment here. Dr. Benay Spice made aware. Will cancel referral to [facility].  Pt understands to expect call from office with appointments.

## 2016-03-08 ENCOUNTER — Telehealth: Payer: Self-pay | Admitting: *Deleted

## 2016-03-08 NOTE — Telephone Encounter (Signed)
Received response from Dr. [name] (CD) via in basket: OK to hold Plavix X 5 days for port. Notified IR.

## 2016-03-08 NOTE — Telephone Encounter (Signed)
Oncology Nurse Navigator Documentation  Oncology Nurse Navigator Flowsheets 03/08/2016  Navigator Location [facility]  Referral date to RadOnc/MedOnc -  Navigator Encounter Type Telephone  Telephone Outgoing Call  Abnormal Finding Date -  Confirmed Diagnosis Date -  Patient Visit Type -  Treatment Phase -  Barriers/Navigation Needs Coordination of Care--PAC; chemo schedule  Education -  Interventions Coordination of Care--inbasket message to cardiologist, Dr. [name] if OK to hold Plavix x 5 days for port on 12/28. Will call office when he returns on 12/20 if no answer from inbasket by then.  Referrals -  Coordination of Care Appts;Other--scheduled chemo class w/patient for 12/20 at 12:30pm. Sent LOS to scheduler for 1st chemo on 03/23/16 due to delay with port placement and holiday closing.  Education Method Verbal;Teach-back  Support Groups/Services -  Acuity -  Time Spent with Patient 30  Also sent LOS to scheduler for office visit and chemo on 04/06/16.

## 2016-03-09 NOTE — Addendum Note (Signed)
Addended by: Lianne Cure A on: 03/09/2016 09:16 AM   Modules accepted: Orders

## 2016-03-10 ENCOUNTER — Other Ambulatory Visit: Payer: Self-pay | Admitting: *Deleted

## 2016-03-11 ENCOUNTER — Other Ambulatory Visit: Payer: Medicare Other

## 2016-03-11 ENCOUNTER — Encounter: Payer: Self-pay | Admitting: *Deleted

## 2016-03-11 MED ORDER — PROCHLORPERAZINE MALEATE 5 MG PO TABS: 5.0000 mg | ORAL_TABLET | Freq: Four times a day (QID) | ORAL | 0 refills | Status: DC | PRN

## 2016-03-11 MED ORDER — LIDOCAINE-PRILOCAINE 2.5-2.5 % EX CREA: TOPICAL_CREAM | CUTANEOUS | 0 refills | Status: DC

## 2016-03-17 ENCOUNTER — Other Ambulatory Visit: Payer: Self-pay | Admitting: General Surgery

## 2016-03-18 ENCOUNTER — Other Ambulatory Visit: Payer: Self-pay | Admitting: Oncology

## 2016-03-18 ENCOUNTER — Ambulatory Visit (HOSPITAL_COMMUNITY)
Admission: RE | Admit: 2016-03-18 | Discharge: 2016-03-18 | Disposition: A | Discharge status: Home / Self Care | Payer: Medicare Other | Source: Ambulatory Visit | Attending: Oncology | Admitting: Oncology

## 2016-03-18 ENCOUNTER — Encounter (HOSPITAL_COMMUNITY): Payer: Self-pay

## 2016-03-18 ENCOUNTER — Telehealth: Payer: Self-pay | Admitting: *Deleted

## 2016-03-18 DIAGNOSIS — I739 Peripheral vascular disease, unspecified: Secondary | ICD-10-CM | POA: Insufficient documentation

## 2016-03-18 DIAGNOSIS — Z923 Personal history of irradiation: Secondary | ICD-10-CM | POA: Diagnosis not present

## 2016-03-18 DIAGNOSIS — Z7902 Long term (current) use of antithrombotics/antiplatelets: Secondary | ICD-10-CM | POA: Insufficient documentation

## 2016-03-18 DIAGNOSIS — E785 Hyperlipidemia, unspecified: Secondary | ICD-10-CM | POA: Insufficient documentation

## 2016-03-18 DIAGNOSIS — F17210 Nicotine dependence, cigarettes, uncomplicated: Secondary | ICD-10-CM | POA: Insufficient documentation

## 2016-03-18 DIAGNOSIS — R3915 Urgency of urination: Secondary | ICD-10-CM | POA: Insufficient documentation

## 2016-03-18 DIAGNOSIS — I255 Ischemic cardiomyopathy: Secondary | ICD-10-CM | POA: Diagnosis not present

## 2016-03-18 DIAGNOSIS — F419 Anxiety disorder, unspecified: Secondary | ICD-10-CM | POA: Insufficient documentation

## 2016-03-18 DIAGNOSIS — Z808 Family history of malignant neoplasm of other organs or systems: Secondary | ICD-10-CM | POA: Diagnosis not present

## 2016-03-18 DIAGNOSIS — I252 Old myocardial infarction: Secondary | ICD-10-CM | POA: Insufficient documentation

## 2016-03-18 DIAGNOSIS — M1990 Unspecified osteoarthritis, unspecified site: Secondary | ICD-10-CM | POA: Diagnosis not present

## 2016-03-18 DIAGNOSIS — I10 Essential (primary) hypertension: Secondary | ICD-10-CM | POA: Insufficient documentation

## 2016-03-18 DIAGNOSIS — Z8546 Personal history of malignant neoplasm of prostate: Secondary | ICD-10-CM | POA: Diagnosis not present

## 2016-03-18 DIAGNOSIS — Z823 Family history of stroke: Secondary | ICD-10-CM | POA: Insufficient documentation

## 2016-03-18 DIAGNOSIS — Z882 Allergy status to sulfonamides status: Secondary | ICD-10-CM | POA: Insufficient documentation

## 2016-03-18 DIAGNOSIS — Z8249 Family history of ischemic heart disease and other diseases of the circulatory system: Secondary | ICD-10-CM | POA: Insufficient documentation

## 2016-03-18 DIAGNOSIS — Z955 Presence of coronary angioplasty implant and graft: Secondary | ICD-10-CM | POA: Diagnosis not present

## 2016-03-18 DIAGNOSIS — Z452 Encounter for adjustment and management of vascular access device: Secondary | ICD-10-CM | POA: Diagnosis not present

## 2016-03-18 DIAGNOSIS — Z8673 Personal history of transient ischemic attack (TIA), and cerebral infarction without residual deficits: Secondary | ICD-10-CM | POA: Insufficient documentation

## 2016-03-18 DIAGNOSIS — Z833 Family history of diabetes mellitus: Secondary | ICD-10-CM | POA: Insufficient documentation

## 2016-03-18 DIAGNOSIS — C155 Malignant neoplasm of lower third of esophagus: Secondary | ICD-10-CM | POA: Insufficient documentation

## 2016-03-18 DIAGNOSIS — J449 Chronic obstructive pulmonary disease, unspecified: Secondary | ICD-10-CM | POA: Insufficient documentation

## 2016-03-18 DIAGNOSIS — Z880 Allergy status to penicillin: Secondary | ICD-10-CM | POA: Insufficient documentation

## 2016-03-18 DIAGNOSIS — Z803 Family history of malignant neoplasm of breast: Secondary | ICD-10-CM | POA: Insufficient documentation

## 2016-03-18 DIAGNOSIS — I2510 Atherosclerotic heart disease of native coronary artery without angina pectoris: Secondary | ICD-10-CM | POA: Diagnosis not present

## 2016-03-18 DIAGNOSIS — I491 Atrial premature depolarization: Secondary | ICD-10-CM | POA: Diagnosis not present

## 2016-03-18 DIAGNOSIS — K219 Gastro-esophageal reflux disease without esophagitis: Secondary | ICD-10-CM | POA: Diagnosis not present

## 2016-03-18 DIAGNOSIS — I714 Abdominal aortic aneurysm, without rupture: Secondary | ICD-10-CM | POA: Insufficient documentation

## 2016-03-18 DIAGNOSIS — Z85118 Personal history of other malignant neoplasm of bronchus and lung: Secondary | ICD-10-CM | POA: Insufficient documentation

## 2016-03-18 HISTORY — PX: IR GENERIC HISTORICAL: IMG1180011

## 2016-03-18 LAB — BASIC METABOLIC PANEL
Anion gap: 9 (ref 5–15)
BUN: 21 mg/dL — ABNORMAL HIGH (ref 6–20)
CALCIUM: 9.1 mg/dL (ref 8.9–10.3)
CO2: 31 mmol/L (ref 22–32)
CREATININE: 0.85 mg/dL (ref 0.61–1.24)
Chloride: 101 mmol/L (ref 101–111)
Glucose, Bld: 114 mg/dL — ABNORMAL HIGH (ref 65–99)
Potassium: 3.2 mmol/L — ABNORMAL LOW (ref 3.5–5.1)
SODIUM: 141 mmol/L (ref 135–145)

## 2016-03-18 LAB — APTT: APTT: 41 s — AB (ref 24–36)

## 2016-03-18 LAB — CBC WITH DIFFERENTIAL/PLATELET
BASOS PCT: 1 %
Basophils Absolute: 0 10*3/uL (ref 0.0–0.1)
EOS ABS: 0.2 10*3/uL (ref 0.0–0.7)
Eosinophils Relative: 2 %
HCT: 27.1 % — ABNORMAL LOW (ref 39.0–52.0)
HEMOGLOBIN: 8.9 g/dL — AB (ref 13.0–17.0)
Lymphocytes Relative: 10 %
Lymphs Abs: 0.7 10*3/uL (ref 0.7–4.0)
MCH: 27.9 pg (ref 26.0–34.0)
MCHC: 32.8 g/dL (ref 30.0–36.0)
MCV: 85 fL (ref 78.0–100.0)
MONOS PCT: 9 %
Monocytes Absolute: 0.7 10*3/uL (ref 0.1–1.0)
NEUTROS PCT: 78 %
Neutro Abs: 5.9 10*3/uL (ref 1.7–7.7)
Platelets: 432 10*3/uL — ABNORMAL HIGH (ref 150–400)
RBC: 3.19 MIL/uL — ABNORMAL LOW (ref 4.22–5.81)
RDW: 15.4 % (ref 11.5–15.5)
WBC: 7.5 10*3/uL (ref 4.0–10.5)

## 2016-03-18 LAB — PROTIME-INR
INR: 1.08
PROTHROMBIN TIME: 14 s (ref 11.4–15.2)

## 2016-03-18 MED ORDER — FENTANYL CITRATE (PF) 100 MCG/2ML IJ SOLN
INTRAMUSCULAR | Status: AC | PRN
  Administered 2016-03-18: 50 ug via INTRAVENOUS

## 2016-03-18 MED ORDER — LIDOCAINE HCL 1 % IJ SOLN
INTRAMUSCULAR | Status: AC
  Filled 2016-03-18: qty 20

## 2016-03-18 MED ORDER — MIDAZOLAM HCL 2 MG/2ML IJ SOLN
INTRAMUSCULAR | Status: AC
  Filled 2016-03-18: qty 6

## 2016-03-18 MED ORDER — MIDAZOLAM HCL 2 MG/2ML IJ SOLN
INTRAMUSCULAR | Status: AC | PRN
  Administered 2016-03-18 (×2): 1 mg via INTRAVENOUS

## 2016-03-18 MED ORDER — LIDOCAINE HCL 1 % IJ SOLN
INTRAMUSCULAR | Status: AC | PRN
  Administered 2016-03-18: 10 mL

## 2016-03-18 MED ORDER — VANCOMYCIN HCL IN DEXTROSE 1-5 GM/200ML-% IV SOLN
1000.0000 mg | INTRAVENOUS | Status: AC
  Administered 2016-03-18: 1000 mg via INTRAVENOUS
  Filled 2016-03-18: qty 200

## 2016-03-18 MED ORDER — LIDOCAINE-EPINEPHRINE 2 %-1:200000 IJ SOLN
INTRAMUSCULAR | Status: AC
Start: 2016-03-18 — End: 2016-03-18
  Filled 2016-03-18: qty 20

## 2016-03-18 MED ORDER — SODIUM CHLORIDE 0.9 % IV SOLN
INTRAVENOUS | Status: DC
  Administered 2016-03-18: 10:00:00 via INTRAVENOUS

## 2016-03-18 MED ORDER — FENTANYL CITRATE (PF) 100 MCG/2ML IJ SOLN
INTRAMUSCULAR | Status: AC
  Filled 2016-03-18: qty 4

## 2016-03-18 MED ORDER — LIDOCAINE-EPINEPHRINE (PF) 2 %-1:200000 IJ SOLN
INTRAMUSCULAR | Status: AC | PRN
  Administered 2016-03-18: 10 mL

## 2016-03-18 MED ORDER — HEPARIN SOD (PORK) LOCK FLUSH 100 UNIT/ML IV SOLN
INTRAVENOUS | Status: AC | PRN
  Administered 2016-03-18: 500 [IU] via INTRAVENOUS

## 2016-03-18 MED ORDER — HEPARIN SOD (PORK) LOCK FLUSH 100 UNIT/ML IV SOLN
INTRAVENOUS | Status: AC
  Filled 2016-03-18: qty 5

## 2016-03-18 NOTE — Discharge Instructions (Signed)
Moderate Conscious Sedation, Adult, Care After These instructions provide you with information about caring for yourself after your procedure. Your health care provider may also give you more specific instructions. Your treatment has been planned according to current medical practices, but problems sometimes occur. Call your health care provider if you have any problems or questions after your procedure. What can I expect after the procedure? After your procedure, it is common:  To feel sleepy for several hours.  To feel clumsy and have poor balance for several hours.  To have poor judgment for several hours.  To vomit if you eat too soon. Follow these instructions at home: For at least 24 hours after the procedure:   Do not:  Participate in activities where you could fall or become injured.  Drive.  Use heavy machinery.  Drink alcohol.  Take sleeping pills or medicines that cause drowsiness.  Make important decisions or sign legal documents.  Take care of children on your own.  Rest. Eating and drinking  Follow the diet recommended by your health care provider.  If you vomit:  Drink water, juice, or soup when you can drink without vomiting.  Make sure you have little or no nausea before eating solid foods. General instructions  Have a responsible adult stay with you until you are awake and alert.  Take over-the-counter and prescription medicines only as told by your health care provider.  If you smoke, do not smoke without supervision.  Keep all follow-up visits as told by your health care provider. This is important. Contact a health care provider if:  You keep feeling nauseous or you keep vomiting.  You feel light-headed.  You develop a rash.  You have a fever. Get help right away if:  You have trouble breathing. This information is not intended to replace advice given to you by your health care provider. Make sure you discuss any questions you have  with your health care provider. Document Released: 12/27/2012 Document Revised: 08/11/2015 Document Reviewed: 06/28/2015 Elsevier Interactive Patient Education  2017 [place] Insertion, Care After Refer to this sheet in the next few weeks. These instructions provide you with information on caring for yourself after your procedure. Your health care provider may also give you more specific instructions. Your treatment has been planned according to current medical practices, but problems sometimes occur. Call your health care provider if you have any problems or questions after your procedure. WHAT TO EXPECT AFTER THE PROCEDURE After your procedure, it is typical to have the following:   Discomfort at the port insertion site. Ice packs to the area will help.  Bruising on the skin over the port. This will subside in 3-4 days. HOME CARE INSTRUCTIONS  After your port is placed, you will get a manufacturer's information card. The card has information about your port. Keep this card with you at all times.   Know what kind of port you have. There are many types of ports available.   Wear a medical alert bracelet in case of an emergency. This can help alert health care workers that you have a port.   The port can stay in for as long as your health care provider believes it is necessary.   A home health care nurse may give medicines and take care of the port.   You or a family member can get special training and directions for giving medicine and taking care of the port at home.  SEEK MEDICAL CARE IF:  Your port does not flush or you are unable to get a blood return.   You have a fever or chills. SEEK IMMEDIATE MEDICAL CARE IF:  You have new fluid or pus coming from your incision.   You notice a bad smell coming from your incision site.   You have swelling, pain, or more redness at the incision or port site.   You have chest pain or shortness of  breath. This information is not intended to replace advice given to you by your health care provider. Make sure you discuss any questions you have with your health care provider. Document Released: 12/27/2012 Document Revised: 03/13/2013 Document Reviewed: 12/27/2012 Elsevier Interactive Patient Education  2017 [place].  May remove dressing and shower in 24 to 48 hours post procedure. May use EMLA cream (if ordered) 7 days post port insertion. Report signs of infection.

## 2016-03-18 NOTE — Procedures (Signed)
Interventional Radiology Procedure Note  Procedure: Placement of a left IJ approach single lumen PowerPort.  Tip is positioned at the upper RA and catheter is ready for immediate use.  Complications: No immediate Recommendations:  - Ok to shower tomorrow - Do not submerge for 7 days - Routine line care   Signed,  Criselda Peaches, MD

## 2016-03-18 NOTE — Telephone Encounter (Signed)
Daughter, Magda Paganini had left VM on 12/27 regarding question about insurance papers. Navigator attempted to return call today--left VM.

## 2016-03-18 NOTE — H&P (Signed)
Chief Complaint: esophageal carcinoma  Referring Physician:Dr. Izola Price. [place]  Supervising Physician: Jacqulynn Cadet  Patient Status: [facility] - Out-pt  HPI: [name] is an 75 y.o. male who was recently diagnosed with esophageal cancer, known to IR service for a recent CT biopsy of a left iliac lytic lesion.  Since this time, he has been seen by Dr. Benay Spice with plans to start chemotherapy on January 3rd.  No changes in his medical history in the last 2 weeks.  He presents today for placement of a port a cath to initiate therapy.  He does take plavix, but this has been held for at least 6 days.  He has no other complaints.  Past Medical History:  Past Medical History:  Diagnosis Date  . AAA (abdominal aortic aneurysm) (HCC)    4.2 cm IR AAA noted on 01/13/15 PET scan  . Anxiety   . CAD (coronary artery disease)    AMI 1997 w/ BMS x 2 LAD, BMS CFX 2002, DES x 2 LAD 2009, DES CFX & OM 2012   . Carotid artery disease ([place])    RICA CEA 9242, LICA CEA 6834 per Dr. [name],   . COPD (chronic obstructive pulmonary disease) ([place])   . GERD (gastroesophageal reflux disease)    occ  . HTN (hypertension)   . Hydradenitis   . Hyperlipidemia   . Ischemic cardiomyopathy    EF 40-45 percent at cath 2012  . Myocardial infarction   . Non-small cell lung cancer (NSCLC) ([place]) dx'd 12/2014   SBRT  . Osteoarthritis    pt. denies 01/17/2015  . PAC (premature atrial contraction)   . Peripheral vascular disease ([place])   . Pneumonia   . Prostate cancer ([place]) dx'd approx 2008   xrt throughfoley  . Radiation 02/18/15-02/25/15   right upper lung 54 Gy  . Shortness of breath dyspnea   . Stroke [facility]) 11-24-11   "no feeling in right 5th digit"  . Urinary frequency   . Urinary urgency     Past Surgical History:  Past Surgical History:  Procedure Laterality Date  . CARDIAC CATHETERIZATION    . CARDIOVASCULAR STRESS TEST  12-2006  . CARDIOVERSION N/A 10/31/2015   Procedure: CARDIOVERSION;   Surgeon: Sueanne Margarita, MD;  Location: [place];  Service: Cardiovascular;  Laterality: N/A;  . CAROTID ENDARTERECTOMY Right 03-2000   CE  . CAROTID ENDARTERECTOMY Left 11-24-11   CE  . COLONOSCOPY  04-2001   polyp  . [place], 2002, 2009, 2012   8 stents total  . [place]   neg  . ENDARTERECTOMY  11/24/2011   Procedure: ENDARTERECTOMY CAROTID;  Surgeon: Rosetta Posner, MD;  Location: [place];  Service: Vascular;  Laterality: Left;  . EYE SURGERY Bilateral Aug. 17, 2015   Cataract  . FUDUCIAL PLACEMENT N/A 12/25/2014   Procedure: PLACEMENT OF FUDUCIAL;  Surgeon: Collene Gobble, MD;  Location: [place];  Service: Thoracic;  Laterality: N/A;  . PROSTATE SURGERY     Radiation Tx  X's 40  . TEE WITHOUT CARDIOVERSION N/A 10/31/2015   Procedure: TRANSESOPHAGEAL ECHOCARDIOGRAM (TEE);  Surgeon: Sueanne Margarita, MD;  Location: [facility] ENDOSCOPY;  Service: Cardiovascular;  Laterality: N/A;  . VASCULAR SURGERY    . VIDEO BRONCHOSCOPY WITH ENDOBRONCHIAL NAVIGATION N/A 12/25/2014   Procedure: VIDEO BRONCHOSCOPY WITH ENDOBRONCHIAL NAVIGATION  with Biopsies and Brushings;  Surgeon: Collene Gobble, MD;  Location: [place];  Service: Thoracic;  Laterality:  N/A;  . VIDEO BRONCHOSCOPY WITH ENDOBRONCHIAL ULTRASOUND N/A 01/22/2015   Procedure: VIDEO BRONCHOSCOPY WITH ENDOBRONCHIAL ULTRASOUND with Biopsies;  Surgeon: Collene Gobble, MD;  Location: [facility] OR;  Service: Thoracic;  Laterality: N/A;    Family History:  Family History  Problem Relation Age of Onset  . Stroke Father   . Heart disease Father   . Heart disease Mother     pacemaker  . Other Brother     benign brain tumor  . Cancer Brother     Eye-behind  . Cancer Sister     Breast  . Liver cancer Brother   . Diabetes Maternal Grandmother   . Colon polyps Neg Hx   . Esophageal cancer Neg Hx     Social History:  reports that he has been smoking Cigarettes.  He has a 27.50 pack-year smoking history. He has never used  smokeless tobacco. He reports that he drinks alcohol. He reports that he does not use drugs.  Allergies:  Allergies  Allergen Reactions  . Augmentin [Amoxicillin-Pot Clavulanate] Diarrhea, Nausea And Vomiting and Other (See Comments)    Has patient had a PCN reaction causing immediate rash, facial/tongue/throat swelling, SOB or lightheadedness with hypotension: No Has patient had a PCN reaction causing severe rash involving mucus membranes or skin necrosis: No Has patient had a PCN reaction that required hospitalization No Has patient had a PCN reaction occurring within the last 10 years: Yes If all of the above answers are "NO", then may proceed with Cephalosporin use.   . Chantix [Varenicline] Other (See Comments)    Violent nightmares  . Sulfonamide Derivatives Other (See Comments)    Pt do not remember 8-30 md  . Zantac [Ranitidine Hcl] Diarrhea    Medications: Medications reviewed in epic  Please HPI for pertinent positives, otherwise complete 10 system ROS negative.  Mallampati Score: MD Evaluation Airway: WNL Heart: WNL Abdomen: WNL Chest/ Lungs: WNL ASA  Classification: 3 Mallampati/Airway Score: One  Physical Exam: BP (!) 111/52 (BP Location: Right Arm)   Pulse 86   Temp 98.3 F (36.8 C) (Oral)   Resp 18   SpO2 98%  There is no height or weight on file to calculate BMI. General: pleasant, elderly, white male who is laying in bed in NAD HEENT: head is normocephalic, atraumatic.  Sclera are noninjected.  PERRL.  Ears and nose without any masses or lesions.  Mouth is pink and moist Heart: regular, rate, and rhythm.  Normal s1,s2. No obvious murmurs, gallops, or rubs noted.  Palpable radial and pedal pulses bilaterally Lungs: CTAB, no wheezes, rhonchi, or rales noted.  Respiratory effort nonlabored Abd: soft, NT, ND, +BS, no masses, hernias, or organomegaly Psych: A&Ox3 with an appropriate affect.   Labs: Results for orders placed or performed during the hospital  encounter of 03/18/16 (from the past 48 hour(s))  CBC with Differential/Platelet     Status: Abnormal   Collection Time: 03/18/16 10:07 AM  Result Value Ref Range   WBC 7.5 4.0 - 10.5 K/uL   RBC 3.19 (L) 4.22 - 5.81 MIL/uL   Hemoglobin 8.9 (L) 13.0 - 17.0 g/dL   HCT 27.1 (L) 39.0 - 52.0 %   MCV 85.0 78.0 - 100.0 fL   MCH 27.9 26.0 - 34.0 pg   MCHC 32.8 30.0 - 36.0 g/dL   RDW 15.4 11.5 - 15.5 %   Platelets 432 (H) 150 - 400 K/uL   Neutrophils Relative % 78 %   Neutro Abs 5.9 1.7 -  7.7 K/uL   Lymphocytes Relative 10 %   Lymphs Abs 0.7 0.7 - 4.0 K/uL   Monocytes Relative 9 %   Monocytes Absolute 0.7 0.1 - 1.0 K/uL   Eosinophils Relative 2 %   Eosinophils Absolute 0.2 0.0 - 0.7 K/uL   Basophils Relative 1 %   Basophils Absolute 0.0 0.0 - 0.1 K/uL    Imaging: No results found.  Assessment/Plan 1. Esophageal cancer -we will plan to place a PAC today so he can initiate chemotherapy next week. -labs and vitals reviewed -Risks and Benefits discussed with the patient including, but not limited to bleeding, infection, pneumothorax, or fibrin sheath development and need for additional procedures. All of the patient's questions were answered, patient is agreeable to proceed. Consent signed and in chart.  Thank you for this interesting consult.  I greatly enjoyed meeting [name] and look forward to participating in their care.  A copy of this report was sent to the requesting provider on this date.  Electronically Signed: Henreitta Cea 03/18/2016, 10:43 AM   I spent a total of    25 Minutes in face to face in clinical consultation, greater than 50% of which was counseling/coordinating care for esophageal cancer, PAC placement

## 2016-03-22 ENCOUNTER — Other Ambulatory Visit: Payer: Self-pay | Admitting: Oncology

## 2016-03-23 ENCOUNTER — Telehealth: Payer: Self-pay | Admitting: Nurse Practitioner

## 2016-03-23 ENCOUNTER — Ambulatory Visit (HOSPITAL_BASED_OUTPATIENT_CLINIC_OR_DEPARTMENT_OTHER): Payer: Medicare Other | Admitting: Nurse Practitioner

## 2016-03-23 VITALS — BP 101/60 | HR 90 | Temp 97.5°F | Resp 18 | Ht 68.0 in | Wt 126.9 lb

## 2016-03-23 DIAGNOSIS — Z85118 Personal history of other malignant neoplasm of bronchus and lung: Secondary | ICD-10-CM

## 2016-03-23 DIAGNOSIS — Z8546 Personal history of malignant neoplasm of prostate: Secondary | ICD-10-CM | POA: Diagnosis not present

## 2016-03-23 DIAGNOSIS — C7951 Secondary malignant neoplasm of bone: Secondary | ICD-10-CM

## 2016-03-23 DIAGNOSIS — C155 Malignant neoplasm of lower third of esophagus: Secondary | ICD-10-CM | POA: Diagnosis not present

## 2016-03-23 DIAGNOSIS — D649 Anemia, unspecified: Secondary | ICD-10-CM

## 2016-03-23 NOTE — Progress Notes (Addendum)
  [place] OFFICE PROGRESS NOTE   Diagnosis:  Esophagus cancer  INTERVAL HISTORY:   [name] returns as scheduled. He is scheduled for cycle 1 FOLFOX 03/23/2016. He continues to have dysphagia and odynophagia. He is tolerating some liquids. He estimates drinking 2 or 3 nutritional supplements a day.  Objective:  Vital signs in last 24 hours:  Blood pressure 101/60, pulse 90, temperature 97.5 F (36.4 C), temperature source Oral, resp. rate 18, height '5\' 8"'$  (1.727 m), weight 126 lb 14.4 oz (57.6 kg), SpO2 100 %.    HEENT: No thrush or ulcers. Resp: Distant breath sounds.  Cardio: Regular rate and rhythm. GI: Abdomen soft and nontender. No hepatomegaly. Vascular: No leg edema.  Portacath without erythema.   Lab Results:  Lab Results  Component Value Date   WBC 7.5 03/18/2016   HGB 8.9 (L) 03/18/2016   HCT 27.1 (L) 03/18/2016   MCV 85.0 03/18/2016   PLT 432 (H) 03/18/2016   NEUTROABS 5.9 03/18/2016    Imaging:  No results found.  Medications: I have reviewed the patient's current medications.  Assessment/Plan: 1. Adenocarcinoma the esophagus  upper endoscopy 02/17/2016 confirmed a distal esophagus mass  CT chest 01/30/2016-increased in size of bilateral lung nodules  CT abdomen/pelvis 02/23/2016 distal esophagus mass, gastrohepatic adenopathy, iliac and probable thoracic and lumbar metastases, 1.7 cm aortocaval node, nonspecific lung nodules  Biopsy of a left iliac lesion 03/03/2016 confirmed metastatic adenocarcinoma, TTF-1, cytokeratin 7, cytokeratin 20, and CDX-2 positive; morphology similar to the distal esophagus biopsy  2. 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 Prostate cancer treated with external beam radiation  4. COPD  5. Coronary artery disease  6. Peripheral vascular disease  7. Anemia  8.   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 He has a borderline performance status. He is scheduled to begin chemotherapy tomorrow with cycle 1 FOLFOX. We again reviewed potential toxicities. His questions were answered.   He continues to have significant dysphagia/odynophagia. We made a referral to the [facility] dietitian. We discussed that he may need a feeding tube in the future.  He will return for cycle 1 FOLFOX 03/24/2016. We scheduled a follow-up visit in one week. He will contact the office in the interim with any problems.  Patient seen with Dr. Benay Rogers. 25 minutes were spent face-to-face at today's visit with the majority of that time involved in counseling/coordination of care.    Ned Card ANP/GNP-BC   03/23/2016  12:38 PM This was a shared visit with Ned Card. 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 The plan is to proceed with FOLFOX chemotherapy beginning 03/24/2016. He has a borderline performance status. We discussed the potential for increased toxicity associated with chemotherapy. He would like to proceed. We encouraged him to push liquids including nutrition supplements. He will return for an office visit 03/30/2016.  Julieanne Manson, M.D.

## 2016-03-23 NOTE — Telephone Encounter (Signed)
Appointments scheduled per 1/2 LOS. Patient given AVS report and calendars with future scheduled appointments. °

## 2016-03-24 ENCOUNTER — Ambulatory Visit (HOSPITAL_BASED_OUTPATIENT_CLINIC_OR_DEPARTMENT_OTHER): Payer: Medicare Other

## 2016-03-24 ENCOUNTER — Other Ambulatory Visit: Payer: Self-pay | Admitting: Nurse Practitioner

## 2016-03-24 ENCOUNTER — Ambulatory Visit: Payer: Medicare Other | Admitting: Nutrition

## 2016-03-24 ENCOUNTER — Other Ambulatory Visit (HOSPITAL_BASED_OUTPATIENT_CLINIC_OR_DEPARTMENT_OTHER): Payer: Medicare Other

## 2016-03-24 VITALS — BP 135/71 | HR 76 | Temp 97.4°F | Resp 18

## 2016-03-24 DIAGNOSIS — Z5111 Encounter for antineoplastic chemotherapy: Secondary | ICD-10-CM | POA: Diagnosis not present

## 2016-03-24 DIAGNOSIS — C155 Malignant neoplasm of lower third of esophagus: Secondary | ICD-10-CM

## 2016-03-24 LAB — CBC WITH DIFFERENTIAL/PLATELET
BASO%: 0.7 % (ref 0.0–2.0)
Basophils Absolute: 0.1 10*3/uL (ref 0.0–0.1)
EOS%: 1.4 % (ref 0.0–7.0)
Eosinophils Absolute: 0.1 10*3/uL (ref 0.0–0.5)
HCT: 29.2 % — ABNORMAL LOW (ref 38.4–49.9)
HEMOGLOBIN: 9.5 g/dL — AB (ref 13.0–17.1)
LYMPH%: 11.3 % — ABNORMAL LOW (ref 14.0–49.0)
MCH: 27.1 pg — AB (ref 27.2–33.4)
MCHC: 32.5 g/dL (ref 32.0–36.0)
MCV: 83.4 fL (ref 79.3–98.0)
MONO#: 0.6 10*3/uL (ref 0.1–0.9)
MONO%: 8.5 % (ref 0.0–14.0)
NEUT%: 78.1 % — ABNORMAL HIGH (ref 39.0–75.0)
NEUTROS ABS: 5.4 10*3/uL (ref 1.5–6.5)
Platelets: 335 10*3/uL (ref 140–400)
RBC: 3.5 10*6/uL — ABNORMAL LOW (ref 4.20–5.82)
RDW: 15.6 % — AB (ref 11.0–14.6)
WBC: 7 10*3/uL (ref 4.0–10.3)
lymph#: 0.8 10*3/uL — ABNORMAL LOW (ref 0.9–3.3)

## 2016-03-24 LAB — COMPREHENSIVE METABOLIC PANEL
ALBUMIN: 3.3 g/dL — AB (ref 3.5–5.0)
ALK PHOS: 107 U/L (ref 40–150)
ALT: 7 U/L (ref 0–55)
AST: 13 U/L (ref 5–34)
Anion Gap: 10 mEq/L (ref 3–11)
BUN: 21.4 mg/dL (ref 7.0–26.0)
CO2: 29 mEq/L (ref 22–29)
Calcium: 9.5 mg/dL (ref 8.4–10.4)
Chloride: 100 mEq/L (ref 98–109)
Creatinine: 0.8 mg/dL (ref 0.7–1.3)
EGFR: 86 mL/min/{1.73_m2} — ABNORMAL LOW (ref >90)
Glucose: 118 mg/dl (ref 70–140)
POTASSIUM: 3.2 meq/L — AB (ref 3.5–5.1)
SODIUM: 140 meq/L (ref 136–145)
TOTAL PROTEIN: 7 g/dL (ref 6.4–8.3)
Total Bilirubin: 0.27 mg/dL (ref 0.20–1.20)

## 2016-03-24 MED ORDER — DEXTROSE 5 % IV SOLN
Freq: Once | INTRAVENOUS | Status: AC
  Administered 2016-03-24: 10:00:00 via INTRAVENOUS

## 2016-03-24 MED ORDER — DEXAMETHASONE SODIUM PHOSPHATE 10 MG/ML IJ SOLN
10.0000 mg | Freq: Once | INTRAMUSCULAR | Status: AC
  Administered 2016-03-24: 10 mg via INTRAVENOUS

## 2016-03-24 MED ORDER — SODIUM CHLORIDE 0.9 % IV SOLN: 10.0000 mg | Freq: Once | INTRAVENOUS | Status: DC

## 2016-03-24 MED ORDER — OXALIPLATIN CHEMO INJECTION 100 MG/20ML
65.0000 mg/m2 | Freq: Once | INTRAVENOUS | Status: AC
  Administered 2016-03-24: 110 mg via INTRAVENOUS
  Filled 2016-03-24: qty 20

## 2016-03-24 MED ORDER — PALONOSETRON HCL INJECTION 0.25 MG/5ML
0.2500 mg | Freq: Once | INTRAVENOUS | Status: AC
  Administered 2016-03-24: 0.25 mg via INTRAVENOUS

## 2016-03-24 MED ORDER — DEXAMETHASONE SODIUM PHOSPHATE 10 MG/ML IJ SOLN
INTRAMUSCULAR | Status: AC
  Filled 2016-03-24: qty 1

## 2016-03-24 MED ORDER — SODIUM CHLORIDE 0.9 % IV SOLN
1800.0000 mg/m2 | INTRAVENOUS | Status: DC
  Administered 2016-03-24: 3100 mg via INTRAVENOUS
  Filled 2016-03-24: qty 62

## 2016-03-24 MED ORDER — PALONOSETRON HCL INJECTION 0.25 MG/5ML
INTRAVENOUS | Status: AC
  Filled 2016-03-24: qty 5

## 2016-03-24 MED ORDER — LEUCOVORIN CALCIUM INJECTION 350 MG
300.0000 mg/m2 | Freq: Once | INTRAMUSCULAR | Status: AC
  Administered 2016-03-24: 514 mg via INTRAVENOUS
  Filled 2016-03-24: qty 25.7

## 2016-03-24 NOTE — Progress Notes (Signed)
Spoke with Dr. Benay Spice regarding patient's potassium of 3.2. He said he would get Ned Card, NP to look into it.   Wylene Simmer, BSN, RN 03/24/2016 10:48 AM

## 2016-03-24 NOTE — Patient Instructions (Signed)
[facility] Discharge Instructions for Patients Receiving Chemotherapy  Today you received the following chemotherapy agents: Oxaliplatin, Leucovorin, Fluorouracil (5FU)  To help prevent nausea and vomiting after your treatment, we encourage you to take your nausea medication as prescribed.    If you develop nausea and vomiting that is not controlled by your nausea medication, call the clinic.   BELOW ARE SYMPTOMS THAT SHOULD BE REPORTED IMMEDIATELY:  *FEVER GREATER THAN 100.5 F  *CHILLS WITH OR WITHOUT FEVER  NAUSEA AND VOMITING THAT IS NOT CONTROLLED WITH YOUR NAUSEA MEDICATION  *UNUSUAL SHORTNESS OF BREATH  *UNUSUAL BRUISING OR BLEEDING  TENDERNESS IN MOUTH AND THROAT WITH OR WITHOUT PRESENCE OF ULCERS  *URINARY PROBLEMS  *BOWEL PROBLEMS  UNUSUAL RASH Items with * indicate a potential emergency and should be followed up as soon as possible.  Feel free to call the clinic you have any questions or concerns. The clinic phone number is [phone].  Please show the [facility] at check-in to the Emergency Department and triage nurse.  Oxaliplatin Injection What is this medicine? OXALIPLATIN (ox AL i PLA tin) is a chemotherapy drug. It targets fast dividing cells, like cancer cells, and causes these cells to die. This medicine is used to treat cancers of the colon and rectum, and many other cancers. This medicine may be used for other purposes; ask your health care provider or pharmacist if you have questions. COMMON BRAND NAME(S): Eloxatin What should I tell my health care provider before I take this medicine? They need to know if you have any of these conditions: -kidney disease -an unusual or allergic reaction to oxaliplatin, other chemotherapy, other medicines, foods, dyes, or preservatives -pregnant or trying to get pregnant -breast-feeding How should I use this medicine? This drug is given as an infusion into a vein. It is administered in a  hospital or clinic by a specially trained health care professional. Talk to your pediatrician regarding the use of this medicine in children. Special care may be needed. Overdosage: If you think you have taken too much of this medicine contact a poison control center or emergency room at once. NOTE: This medicine is only for you. Do not share this medicine with others. What if I miss a dose? It is important not to miss a dose. Call your doctor or health care professional if you are unable to keep an appointment. What may interact with this medicine? -medicines to increase blood counts like filgrastim, pegfilgrastim, sargramostim -probenecid -some antibiotics like amikacin, gentamicin, neomycin, polymyxin B, streptomycin, tobramycin -zalcitabine Talk to your doctor or health care professional before taking any of these medicines: -acetaminophen -aspirin -ibuprofen -ketoprofen -naproxen This list may not describe all possible interactions. Give your health care provider a list of all the medicines, herbs, non-prescription drugs, or dietary supplements you use. Also tell them if you smoke, drink alcohol, or use illegal drugs. Some items may interact with your medicine. What should I watch for while using this medicine? Your condition will be monitored carefully while you are receiving this medicine. You will need important blood work done while you are taking this medicine. This medicine can make you more sensitive to cold. Do not drink cold drinks or use ice. Cover exposed skin before coming in contact with cold temperatures or cold objects. When out in cold weather wear warm clothing and cover your mouth and nose to warm the air that goes into your lungs. Tell your doctor if you get sensitive to the cold.  This drug may make you feel generally unwell. This is not uncommon, as chemotherapy can affect healthy cells as well as cancer cells. Report any side effects. Continue your course of treatment  even though you feel ill unless your doctor tells you to stop. In some cases, you may be given additional medicines to help with side effects. Follow all directions for their use. Call your doctor or health care professional for advice if you get a fever, chills or sore throat, or other symptoms of a cold or flu. Do not treat yourself. This drug decreases your body's ability to fight infections. Try to avoid being around people who are sick. This medicine may increase your risk to bruise or bleed. Call your doctor or health care professional if you notice any unusual bleeding. Be careful brushing and flossing your teeth or using a toothpick because you may get an infection or bleed more easily. If you have any dental work done, tell your dentist you are receiving this medicine. Avoid taking products that contain aspirin, acetaminophen, ibuprofen, naproxen, or ketoprofen unless instructed by your doctor. These medicines may hide a fever. Do not become pregnant while taking this medicine. Women should inform their doctor if they wish to become pregnant or think they might be pregnant. There is a potential for serious side effects to an unborn child. Talk to your health care professional or pharmacist for more information. Do not breast-feed an infant while taking this medicine. Call your doctor or health care professional if you get diarrhea. Do not treat yourself. What side effects may I notice from receiving this medicine? Side effects that you should report to your doctor or health care professional as soon as possible: -allergic reactions like skin rash, itching or hives, swelling of the face, lips, or tongue -low blood counts - This drug may decrease the number of white blood cells, red blood cells and platelets. You may be at increased risk for infections and bleeding. -signs of infection - fever or chills, cough, sore throat, pain or difficulty passing urine -signs of decreased platelets or  bleeding - bruising, pinpoint red spots on the skin, black, tarry stools, nosebleeds -signs of decreased red blood cells - unusually weak or tired, fainting spells, lightheadedness -breathing problems -chest pain, pressure -cough -diarrhea -jaw tightness -mouth sores -nausea and vomiting -pain, swelling, redness or irritation at the injection site -pain, tingling, numbness in the hands or feet -problems with balance, talking, walking -redness, blistering, peeling or loosening of the skin, including inside the mouth -trouble passing urine or change in the amount of urine Side effects that usually do not require medical attention (report to your doctor or health care professional if they continue or are bothersome): -changes in vision -constipation -hair loss -loss of appetite -metallic taste in the mouth or changes in taste -stomach pain This list may not describe all possible side effects. Call your doctor for medical advice about side effects. You may report side effects to FDA at 1-800-FDA-1088. Where should I keep my medicine? This drug is given in a hospital or clinic and will not be stored at home. NOTE: This sheet is a summary. It may not cover all possible information. If you have questions about this medicine, talk to your doctor, pharmacist, or health care provider.  2017 Elsevier/Gold Standard (2007-10-03 17:22:47)  Leucovorin injection What is this medicine? LEUCOVORIN (loo koe VOR in) is used to prevent or treat the harmful effects of some medicines. This medicine is used to treat  anemia caused by a low amount of folic acid in the body. It is also used with 5-fluorouracil (5-FU) to treat colon cancer. This medicine may be used for other purposes; ask your health care provider or pharmacist if you have questions. What should I tell my health care provider before I take this medicine? They need to know if you have any of these conditions: -anemia from low levels of vitamin  B-12 in the blood -an unusual or allergic reaction to leucovorin, folic acid, other medicines, foods, dyes, or preservatives -pregnant or trying to get pregnant -breast-feeding How should I use this medicine? This medicine is for injection into a muscle or into a vein. It is given by a health care professional in a hospital or clinic setting. Talk to your pediatrician regarding the use of this medicine in children. Special care may be needed. Overdosage: If you think you have taken too much of this medicine contact a poison control center or emergency room at once. NOTE: This medicine is only for you. Do not share this medicine with others. What if I miss a dose? This does not apply. What may interact with this medicine? -capecitabine -fluorouracil -phenobarbital -phenytoin -primidone -trimethoprim-sulfamethoxazole This list may not describe all possible interactions. Give your health care provider a list of all the medicines, herbs, non-prescription drugs, or dietary supplements you use. Also tell them if you smoke, drink alcohol, or use illegal drugs. Some items may interact with your medicine. What should I watch for while using this medicine? Your condition will be monitored carefully while you are receiving this medicine. This medicine may increase the side effects of 5-fluorouracil, 5-FU. Tell your doctor or health care professional if you have diarrhea or mouth sores that do not get better or that get worse. What side effects may I notice from receiving this medicine? Side effects that you should report to your doctor or health care professional as soon as possible: -allergic reactions like skin rash, itching or hives, swelling of the face, lips, or tongue -breathing problems -fever, infection -mouth sores -unusual bleeding or bruising -unusually weak or tired Side effects that usually do not require medical attention (report to your doctor or health care professional if they  continue or are bothersome): -constipation or diarrhea -loss of appetite -nausea, vomiting This list may not describe all possible side effects. Call your doctor for medical advice about side effects. You may report side effects to FDA at 1-800-FDA-1088. Where should I keep my medicine? This drug is given in a hospital or clinic and will not be stored at home. NOTE: This sheet is a summary. It may not cover all possible information. If you have questions about this medicine, talk to your doctor, pharmacist, or health care provider.  2017 Elsevier/Gold Standard (2007-09-12 16:50:29)  Fluorouracil, 5-FU injection What is this medicine? FLUOROURACIL, 5-FU (flure oh YOOR a sil) is a chemotherapy drug. It slows the growth of cancer cells. This medicine is used to treat many types of cancer like breast cancer, colon or rectal cancer, pancreatic cancer, and stomach cancer. This medicine may be used for other purposes; ask your health care provider or pharmacist if you have questions. COMMON BRAND NAME(S): Adrucil What should I tell my health care provider before I take this medicine? They need to know if you have any of these conditions: -blood disorders -dihydropyrimidine dehydrogenase (DPD) deficiency -infection (especially a virus infection such as chickenpox, cold sores, or herpes) -kidney disease -liver disease -malnourished, poor nutrition -recent or  ongoing radiation therapy -an unusual or allergic reaction to fluorouracil, other chemotherapy, other medicines, foods, dyes, or preservatives -pregnant or trying to get pregnant -breast-feeding How should I use this medicine? This drug is given as an infusion or injection into a vein. It is administered in a hospital or clinic by a specially trained health care professional. Talk to your pediatrician regarding the use of this medicine in children. Special care may be needed. Overdosage: If you think you have taken too much of this medicine  contact a poison control center or emergency room at once. NOTE: This medicine is only for you. Do not share this medicine with others. What if I miss a dose? It is important not to miss your dose. Call your doctor or health care professional if you are unable to keep an appointment. What may interact with this medicine? -allopurinol -cimetidine -dapsone -digoxin -hydroxyurea -leucovorin -levamisole -medicines for seizures like ethotoin, fosphenytoin, phenytoin -medicines to increase blood counts like filgrastim, pegfilgrastim, sargramostim -medicines that treat or prevent blood clots like warfarin, enoxaparin, and dalteparin -methotrexate -metronidazole -pyrimethamine -some other chemotherapy drugs like busulfan, cisplatin, estramustine, vinblastine -trimethoprim -trimetrexate -vaccines Talk to your doctor or health care professional before taking any of these medicines: -acetaminophen -aspirin -ibuprofen -ketoprofen -naproxen This list may not describe all possible interactions. Give your health care provider a list of all the medicines, herbs, non-prescription drugs, or dietary supplements you use. Also tell them if you smoke, drink alcohol, or use illegal drugs. Some items may interact with your medicine. What should I watch for while using this medicine? Visit your doctor for checks on your progress. This drug may make you feel generally unwell. This is not uncommon, as chemotherapy can affect healthy cells as well as cancer cells. Report any side effects. Continue your course of treatment even though you feel ill unless your doctor tells you to stop. In some cases, you may be given additional medicines to help with side effects. Follow all directions for their use. Call your doctor or health care professional for advice if you get a fever, chills or sore throat, or other symptoms of a cold or flu. Do not treat yourself. This drug decreases your body's ability to fight  infections. Try to avoid being around people who are sick. This medicine may increase your risk to bruise or bleed. Call your doctor or health care professional if you notice any unusual bleeding. Be careful brushing and flossing your teeth or using a toothpick because you may get an infection or bleed more easily. If you have any dental work done, tell your dentist you are receiving this medicine. Avoid taking products that contain aspirin, acetaminophen, ibuprofen, naproxen, or ketoprofen unless instructed by your doctor. These medicines may hide a fever. Do not become pregnant while taking this medicine. Women should inform their doctor if they wish to become pregnant or think they might be pregnant. There is a potential for serious side effects to an unborn child. Talk to your health care professional or pharmacist for more information. Do not breast-feed an infant while taking this medicine. Men should inform their doctor if they wish to father a child. This medicine may lower sperm counts. Do not treat diarrhea with over the counter products. Contact your doctor if you have diarrhea that lasts more than 2 days or if it is severe and watery. This medicine can make you more sensitive to the sun. Keep out of the sun. If you cannot avoid being in the sun,  wear protective clothing and use sunscreen. Do not use sun lamps or tanning beds/booths. What side effects may I notice from receiving this medicine? Side effects that you should report to your doctor or health care professional as soon as possible: -allergic reactions like skin rash, itching or hives, swelling of the face, lips, or tongue -low blood counts - this medicine may decrease the number of white blood cells, red blood cells and platelets. You may be at increased risk for infections and bleeding. -signs of infection - fever or chills, cough, sore throat, pain or difficulty passing urine -signs of decreased platelets or bleeding - bruising,  pinpoint red spots on the skin, black, tarry stools, blood in the urine -signs of decreased red blood cells - unusually weak or tired, fainting spells, lightheadedness -breathing problems -changes in vision -chest pain -mouth sores -nausea and vomiting -pain, swelling, redness at site where injected -pain, tingling, numbness in the hands or feet -redness, swelling, or sores on hands or feet -stomach pain -unusual bleeding Side effects that usually do not require medical attention (report to your doctor or health care professional if they continue or are bothersome): -changes in finger or toe nails -diarrhea -dry or itchy skin -hair loss -headache -loss of appetite -sensitivity of eyes to the light -stomach upset -unusually teary eyes This list may not describe all possible side effects. Call your doctor for medical advice about side effects. You may report side effects to FDA at 1-800-FDA-1088. Where should I keep my medicine? This drug is given in a hospital or clinic and will not be stored at home. NOTE: This sheet is a summary. It may not cover all possible information. If you have questions about this medicine, talk to your doctor, pharmacist, or health care provider.  2017 Elsevier/Gold Standard (2007-07-12 13:53:16)

## 2016-03-24 NOTE — Progress Notes (Signed)
76 year old man diagnosed with metastatic esophageal cancer.  He is a patient of Dr. Julieanne Manson.  Past medical history includes stroke, prostate cancer in 2008, radiation therapy, PVD, non-small cell lung cancer in 2016, MI, hyperlipidemia, hypertension, GERD, COPD, anxiety, and AAA.  Medications include Xanax, Remeron, multivitamin, Prilosec.  Labs include potassium 3.2 and albumin 3.3.  Height: 68 inches. Weight: 126.9 pounds. Usual body weight: 145 pounds  BMI: 19.3.  Patient reports poor appetite and difficulty swallowing. He reports food has no flavor. He does have some nausea and reports diarrhea last night. Has been drinking 2 oral nutrition supplements a day. States he does not want feeding tube.  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  Intervention: Patient was educated to consume high-calorie high-protein foods in small frequent meals and snacks. Recommended patient try to increase oral nutrition supplements to a minimum of 5 daily to provide adequate calories and protein. Coupons were provided. Brief discussion on feeding tube. Questions were answered.  Teach back method used.  Contact information was given.  Monitoring, evaluation, goals:  Patient will tolerate increased calories and protein to minimize weight loss.  Next visit: Tuesday, January 16, during infusion.  **Disclaimer: This note was dictated with voice recognition software. Similar sounding words can inadvertently be transcribed and this note may contain transcription errors which may not have been corrected upon publication of note.**

## 2016-03-25 ENCOUNTER — Other Ambulatory Visit: Payer: Self-pay | Admitting: Nurse Practitioner

## 2016-03-25 ENCOUNTER — Ambulatory Visit: Payer: Medicare Other | Admitting: Nurse Practitioner

## 2016-03-25 ENCOUNTER — Telehealth: Payer: Self-pay

## 2016-03-25 DIAGNOSIS — C155 Malignant neoplasm of lower third of esophagus: Secondary | ICD-10-CM

## 2016-03-25 DIAGNOSIS — E876 Hypokalemia: Secondary | ICD-10-CM

## 2016-03-25 MED ORDER — POTASSIUM CHLORIDE 20 MEQ/15ML (10%) PO SOLN: 20.0000 meq | Freq: Every day | ORAL | 0 refills | Status: DC

## 2016-03-25 NOTE — Telephone Encounter (Signed)
Called pt and informed him of low potassium, pt prefers liquid potassium, Ned Card Informed for rx. Pt verbalized understanding.

## 2016-03-25 NOTE — Telephone Encounter (Signed)
-----   Message from Belva Chimes, LPN sent at 03/27/1094 12:11 PM EST -----   ----- Message ----- From: Owens Shark, NP Sent: 03/24/2016   4:09 PM To: Belva Chimes, LPN  Please let him know potassium is decreased. Would he prefer liquid potassium or to dissolve a tablet? Let me know and I will send a prescription to his pharmacy. Thanks

## 2016-03-26 ENCOUNTER — Encounter: Payer: Self-pay | Admitting: Oncology

## 2016-03-26 ENCOUNTER — Ambulatory Visit (HOSPITAL_BASED_OUTPATIENT_CLINIC_OR_DEPARTMENT_OTHER): Payer: Medicare Other

## 2016-03-26 ENCOUNTER — Encounter: Payer: Self-pay | Admitting: *Deleted

## 2016-03-26 VITALS — BP 116/54 | HR 79 | Temp 98.1°F | Resp 20

## 2016-03-26 DIAGNOSIS — C155 Malignant neoplasm of lower third of esophagus: Secondary | ICD-10-CM | POA: Diagnosis not present

## 2016-03-26 DIAGNOSIS — Z452 Encounter for adjustment and management of vascular access device: Secondary | ICD-10-CM | POA: Diagnosis not present

## 2016-03-26 MED ORDER — SODIUM CHLORIDE 0.9 % IV SOLN
INTRAVENOUS | Status: AC
  Administered 2016-03-26: 13:00:00 via INTRAVENOUS

## 2016-03-26 MED ORDER — HEPARIN SOD (PORK) LOCK FLUSH 100 UNIT/ML IV SOLN
500.0000 [IU] | Freq: Once | INTRAVENOUS | Status: AC | PRN
  Administered 2016-03-26: 500 [IU]
  Filled 2016-03-26: qty 5

## 2016-03-26 MED ORDER — SODIUM CHLORIDE 0.9% FLUSH
10.0000 mL | INTRAVENOUS | Status: DC | PRN
  Administered 2016-03-26: 10 mL
  Filled 2016-03-26: qty 10

## 2016-03-26 NOTE — Progress Notes (Signed)
Oncology Nurse Navigator Documentation  Oncology Nurse Navigator Flowsheets 03/26/2016  Navigator Location [facility]-  Referral date to RadOnc/MedOnc -  Navigator Encounter Type Treatment  Telephone -  Abnormal Finding Date -  Confirmed Diagnosis Date -  Treatment Initiated Date 03/24/2016  Patient Visit Type MedOnc  Treatment Phase Active Tx--pump d/c today with IV fluids  Barriers/Navigation Needs Education  Education Pain/ Symptom Management--diarrhea, poor appetite  Interventions Education  Referrals -  Coordination of Care -  Education Method Verbal;Teach-back--po fluids, frequent small snacks, imodium up to 8/day if needed; get up and walk-stay active to keep strength up and stimulate appetite.  Support Groups/Services -Tells navigator his spirits are OK. Denies any depression. Feels he has done pretty well with the 1st treatment except for feeling weak and it is difficult for him to dress when he has the pump infusing, saying it confuses him.  Acuity Level 1  Time Spent with Patient 15

## 2016-03-26 NOTE — Patient Instructions (Signed)
Dehydration, Adult Dehydration is when there is not enough fluid or water in your body. This happens when you lose more fluids than you take in. Dehydration can range from mild to very bad. It should be treated right away to keep it from getting very bad. Symptoms of mild dehydration may include:   Thirst.  Dry lips.  Slightly dry mouth.  Dry, warm skin.  Dizziness. Symptoms of moderate dehydration may include:   Very dry mouth.  Muscle cramps.  Dark pee (urine). Pee may be the color of tea.  Your body making less pee.  Your eyes making fewer tears.  Heartbeat that is uneven or faster than normal (palpitations).  Headache.  Light-headedness, especially when you stand up from sitting.  Fainting (syncope). Symptoms of very bad dehydration may include:   Changes in skin, such as:  Cold and clammy skin.  Blotchy (mottled) or pale skin.  Skin that does not quickly return to normal after being lightly pinched and let go (poor skin turgor).  Changes in body fluids, such as:  Feeling very thirsty.  Your eyes making fewer tears.  Not sweating when body temperature is high, such as in hot weather.  Your body making very little pee.  Changes in vital signs, such as:  Weak pulse.  Pulse that is more than 100 beats a minute when you are sitting still.  Fast breathing.  Low blood pressure.  Other changes, such as:  Sunken eyes.  Cold hands and feet.  Confusion.  Lack of energy (lethargy).  Trouble waking up from sleep.  Short-term weight loss.  Unconsciousness. Follow these instructions at home:  If told by your doctor, drink an ORS:  Make an ORS by using instructions on the package.  Start by drinking small amounts, about  cup (120 mL) every 5-10 minutes.  Slowly drink more until you have had the amount that your doctor said to have.  Drink enough clear fluid to keep your pee clear or pale yellow. If you were told to drink an ORS, finish the  ORS first, then start slowly drinking clear fluids. Drink fluids such as:  Water. Do not drink only water by itself. Doing that can make the salt (sodium) level in your body get too low (hyponatremia).  Ice chips.  Fruit juice that you have added water to (diluted).  Low-calorie sports drinks.  Avoid:  Alcohol.  Drinks that have a lot of sugar. These include high-calorie sports drinks, fruit juice that does not have water added, and soda.  Caffeine.  Foods that are greasy or have a lot of fat or sugar.  Take over-the-counter and prescription medicines only as told by your doctor.  Do not take salt tablets. Doing that can make the salt level in your body get too high (hypernatremia).  Eat foods that have minerals (electrolytes). Examples include bananas, oranges, potatoes, tomatoes, and spinach.  Keep all follow-up visits as told by your doctor. This is important. Contact a doctor if:  You have belly (abdominal) pain that:  Gets worse.  Stays in one area (localizes).  You have a rash.  You have a stiff neck.  You get angry or annoyed more easily than normal (irritability).  You are more sleepy than normal.  You have a harder time waking up than normal.  You feel:  Weak.  Dizzy.  Very thirsty.  You have peed (urinated) only a small amount of very dark pee during 6-8 hours. Get help right away if:  You   have symptoms of very bad dehydration.  You cannot drink fluids without throwing up (vomiting).  Your symptoms get worse with treatment.  You have a fever.  You have a very bad headache.  You are throwing up or having watery poop (diarrhea) and it:  Gets worse.  Does not go away.  You have blood or something green (bile) in your throw-up.  You have blood in your poop (stool). This may cause poop to look black and tarry.  You have not peed in 6-8 hours.  You pass out (faint).  Your heart rate when you are sitting still is more than 100 beats a  minute.  You have trouble breathing. This information is not intended to replace advice given to you by your health care provider. Make sure you discuss any questions you have with your health care provider. Document Released: 01/02/2009 Document Revised: 09/26/2015 Document Reviewed: 05/02/2015 Elsevier Interactive Patient Education  2017 Elsevier Inc.  

## 2016-03-26 NOTE — Progress Notes (Unsigned)
Faxed FMLA paperwork to [employer] at [phone]

## 2016-03-30 ENCOUNTER — Telehealth: Payer: Self-pay | Admitting: Nurse Practitioner

## 2016-03-30 ENCOUNTER — Ambulatory Visit (HOSPITAL_BASED_OUTPATIENT_CLINIC_OR_DEPARTMENT_OTHER): Payer: Medicare Other | Admitting: Oncology

## 2016-03-30 VITALS — BP 132/87 | HR 108 | Temp 97.8°F | Resp 16 | Wt 123.6 lb

## 2016-03-30 DIAGNOSIS — Z85118 Personal history of other malignant neoplasm of bronchus and lung: Secondary | ICD-10-CM | POA: Diagnosis not present

## 2016-03-30 DIAGNOSIS — D649 Anemia, unspecified: Secondary | ICD-10-CM

## 2016-03-30 DIAGNOSIS — C7951 Secondary malignant neoplasm of bone: Secondary | ICD-10-CM | POA: Diagnosis not present

## 2016-03-30 DIAGNOSIS — R634 Abnormal weight loss: Secondary | ICD-10-CM

## 2016-03-30 DIAGNOSIS — C155 Malignant neoplasm of lower third of esophagus: Secondary | ICD-10-CM | POA: Diagnosis not present

## 2016-03-30 DIAGNOSIS — R630 Anorexia: Secondary | ICD-10-CM

## 2016-03-30 DIAGNOSIS — Z8546 Personal history of malignant neoplasm of prostate: Secondary | ICD-10-CM

## 2016-03-30 NOTE — Progress Notes (Signed)
  [facility] OFFICE PROGRESS NOTE   Diagnosis: Esophagus cancer  INTERVAL HISTORY:   [name] completed cycle 1 FOLFOX 04/03/2016. He reports mild nausea following chemotherapy. He has diarrhea once per day. No neuropathy symptoms. He has noted some improvement in dysphagia. He continues to have anorexia. He feels "weak ".  Objective:  Vital signs in last 24 hours:  Blood pressure 132/87, pulse (!) 108, temperature 97.8 F (36.6 C), temperature source Oral, resp. rate 16, weight 123 lb 9.6 oz (56.1 kg), SpO2 100 %.    HEENT: No thrush or ulcers Resp: Lungs inspiratory/expiratory rhonchi and rub at the right posterior chest, no respiratory distress Cardio: Regular rate and rhythm GI: No hepatomegaly, nontender Vascular: No leg edema  Portacath/PICC-without erythema  Lab Results:  Lab Results  Component Value Date   WBC 7.0 03/24/2016   HGB 9.5 (L) 03/24/2016   HCT 29.2 (L) 03/24/2016   MCV 83.4 03/24/2016   PLT 335 03/24/2016   NEUTROABS 5.4 03/24/2016     Medications: I have reviewed the patient's current medications.  Assessment/Plan: 1. Adenocarcinoma the esophagus  upper endoscopy 02/17/2016 confirmed a distal esophagus mass  CT chest 01/30/2016-increased in size of bilateral lung nodules  CT abdomen/pelvis 02/23/2016 distal esophagus mass, gastrohepatic adenopathy, iliac and probable thoracic and lumbar metastases, 1.7 cm aortocaval node, nonspecific lung nodules  Biopsy of a left iliac lesion 03/03/2016 confirmed metastatic adenocarcinoma, TTF-1, cytokeratin 7, cytokeratin 20, and CDX-2positive; morphology similar to the distal esophagus biopsy  Cycle 1 FOLFOX 03/24/2016  2. 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 Prostate cancer treated with external beam radiation  4. COPD  5. Coronary artery disease  6.  Peripheral vascular disease  7. Anemia  8.  03/18/2016 Port-A-Cath placement    Disposition:  [name] tolerated the chemotherapy well. He continues to have anorexia and weight loss. I encouraged him to increase the use of nutrition supplements. He will return for an office visit and cycle 2 chemotherapy in one week. 15 minutes were spent with the patient today. The majority of the time was used for counseling and coordination of care.  [name], MD  03/30/2016  10:50 AM

## 2016-03-30 NOTE — Telephone Encounter (Signed)
Appointments scheduled per 1/9 LOS. Patient given AVS report and calendars with future scheduled appointments. °

## 2016-04-01 ENCOUNTER — Telehealth: Payer: Self-pay | Admitting: *Deleted

## 2016-04-01 NOTE — Telephone Encounter (Signed)
Call from daughter, Antony Haste that the FMLA form that was faxed to [employer] needs a date correction. Under section C: need the return date to be 09/19/16 (instead of 09/06/16). Informed her I will forward this request to the staff completing the FMLA for her.

## 2016-04-04 ENCOUNTER — Other Ambulatory Visit: Payer: Self-pay | Admitting: Oncology

## 2016-04-05 ENCOUNTER — Encounter: Payer: Self-pay | Admitting: Oncology

## 2016-04-05 NOTE — Progress Notes (Signed)
Faxed FMLA paperwork to [employer] at [phone] on 04/02/16

## 2016-04-06 ENCOUNTER — Ambulatory Visit (HOSPITAL_BASED_OUTPATIENT_CLINIC_OR_DEPARTMENT_OTHER): Payer: Medicare Other

## 2016-04-06 ENCOUNTER — Ambulatory Visit: Payer: Medicare Other | Admitting: Nutrition

## 2016-04-06 ENCOUNTER — Ambulatory Visit (HOSPITAL_BASED_OUTPATIENT_CLINIC_OR_DEPARTMENT_OTHER): Payer: Medicare Other | Admitting: Nurse Practitioner

## 2016-04-06 ENCOUNTER — Encounter: Payer: Self-pay | Admitting: *Deleted

## 2016-04-06 ENCOUNTER — Other Ambulatory Visit (HOSPITAL_BASED_OUTPATIENT_CLINIC_OR_DEPARTMENT_OTHER): Payer: Medicare Other

## 2016-04-06 VITALS — BP 103/64 | HR 88 | Temp 98.0°F | Resp 18 | Ht 68.0 in | Wt 124.6 lb

## 2016-04-06 DIAGNOSIS — Z452 Encounter for adjustment and management of vascular access device: Secondary | ICD-10-CM | POA: Diagnosis not present

## 2016-04-06 DIAGNOSIS — C7951 Secondary malignant neoplasm of bone: Secondary | ICD-10-CM | POA: Diagnosis not present

## 2016-04-06 DIAGNOSIS — C155 Malignant neoplasm of lower third of esophagus: Secondary | ICD-10-CM

## 2016-04-06 DIAGNOSIS — R1310 Dysphagia, unspecified: Secondary | ICD-10-CM | POA: Diagnosis not present

## 2016-04-06 DIAGNOSIS — Z5111 Encounter for antineoplastic chemotherapy: Secondary | ICD-10-CM

## 2016-04-06 DIAGNOSIS — D649 Anemia, unspecified: Secondary | ICD-10-CM

## 2016-04-06 DIAGNOSIS — E876 Hypokalemia: Secondary | ICD-10-CM

## 2016-04-06 DIAGNOSIS — Z8546 Personal history of malignant neoplasm of prostate: Secondary | ICD-10-CM

## 2016-04-06 DIAGNOSIS — Z85118 Personal history of other malignant neoplasm of bronchus and lung: Secondary | ICD-10-CM | POA: Diagnosis not present

## 2016-04-06 DIAGNOSIS — Z95828 Presence of other vascular implants and grafts: Secondary | ICD-10-CM | POA: Insufficient documentation

## 2016-04-06 LAB — COMPREHENSIVE METABOLIC PANEL WITH GFR
ALT: 10 U/L (ref 0–55)
AST: 14 U/L (ref 5–34)
Albumin: 3.1 g/dL — ABNORMAL LOW (ref 3.5–5.0)
Alkaline Phosphatase: 118 U/L (ref 40–150)
Anion Gap: 10 meq/L (ref 3–11)
BUN: 16.8 mg/dL (ref 7.0–26.0)
CO2: 30 meq/L — ABNORMAL HIGH (ref 22–29)
Calcium: 9.1 mg/dL (ref 8.4–10.4)
Chloride: 101 meq/L (ref 98–109)
Creatinine: 0.8 mg/dL (ref 0.7–1.3)
EGFR: 87 ml/min/1.73 m2 — ABNORMAL LOW
Glucose: 117 mg/dL (ref 70–140)
Potassium: 3.1 meq/L — ABNORMAL LOW (ref 3.5–5.1)
Sodium: 141 meq/L (ref 136–145)
Total Bilirubin: 0.28 mg/dL (ref 0.20–1.20)
Total Protein: 6.8 g/dL (ref 6.4–8.3)

## 2016-04-06 LAB — CBC WITH DIFFERENTIAL/PLATELET
BASO%: 0.3 % (ref 0.0–2.0)
BASOS ABS: 0 10*3/uL (ref 0.0–0.1)
EOS%: 1.6 % (ref 0.0–7.0)
Eosinophils Absolute: 0.1 10*3/uL (ref 0.0–0.5)
HCT: 29.8 % — ABNORMAL LOW (ref 38.4–49.9)
HEMOGLOBIN: 9.6 g/dL — AB (ref 13.0–17.1)
LYMPH%: 15.2 % (ref 14.0–49.0)
MCH: 26.4 pg — AB (ref 27.2–33.4)
MCHC: 32.2 g/dL (ref 32.0–36.0)
MCV: 82.1 fL (ref 79.3–98.0)
MONO#: 0.5 10*3/uL (ref 0.1–0.9)
MONO%: 6.7 % (ref 0.0–14.0)
NEUT#: 5.4 10*3/uL (ref 1.5–6.5)
NEUT%: 76.2 % — ABNORMAL HIGH (ref 39.0–75.0)
Platelets: 282 10*3/uL (ref 140–400)
RBC: 3.63 10*6/uL — ABNORMAL LOW (ref 4.20–5.82)
RDW: 16.6 % — AB (ref 11.0–14.6)
WBC: 7 10*3/uL (ref 4.0–10.3)
lymph#: 1.1 10*3/uL (ref 0.9–3.3)

## 2016-04-06 MED ORDER — SODIUM CHLORIDE 0.9% FLUSH
10.0000 mL | INTRAVENOUS | Status: DC | PRN
  Administered 2016-04-06: 10 mL via INTRAVENOUS
  Filled 2016-04-06: qty 10

## 2016-04-06 MED ORDER — SODIUM CHLORIDE 0.9 % IV SOLN
1800.0000 mg/m2 | INTRAVENOUS | Status: DC
  Administered 2016-04-06: 3100 mg via INTRAVENOUS
  Filled 2016-04-06: qty 62

## 2016-04-06 MED ORDER — DEXAMETHASONE SODIUM PHOSPHATE 10 MG/ML IJ SOLN
INTRAMUSCULAR | Status: AC
  Filled 2016-04-06: qty 1

## 2016-04-06 MED ORDER — PALONOSETRON HCL INJECTION 0.25 MG/5ML
0.2500 mg | Freq: Once | INTRAVENOUS | Status: AC
  Administered 2016-04-06: 0.25 mg via INTRAVENOUS

## 2016-04-06 MED ORDER — OXALIPLATIN CHEMO INJECTION 100 MG/20ML
65.0000 mg/m2 | Freq: Once | INTRAVENOUS | Status: AC
  Administered 2016-04-06: 110 mg via INTRAVENOUS
  Filled 2016-04-06: qty 12

## 2016-04-06 MED ORDER — PALONOSETRON HCL INJECTION 0.25 MG/5ML
INTRAVENOUS | Status: AC
  Filled 2016-04-06: qty 5

## 2016-04-06 MED ORDER — DEXTROSE 5 % IV SOLN
Freq: Once | INTRAVENOUS | Status: AC
  Administered 2016-04-06: 13:00:00 via INTRAVENOUS

## 2016-04-06 MED ORDER — DEXAMETHASONE SODIUM PHOSPHATE 10 MG/ML IJ SOLN
10.0000 mg | Freq: Once | INTRAMUSCULAR | Status: AC
  Administered 2016-04-06: 10 mg via INTRAVENOUS

## 2016-04-06 MED ORDER — SODIUM CHLORIDE 0.9 % IV SOLN
Freq: Once | INTRAVENOUS | Status: AC
  Administered 2016-04-06: 12:00:00 via INTRAVENOUS

## 2016-04-06 MED ORDER — LEUCOVORIN CALCIUM INJECTION 350 MG
300.0000 mg/m2 | Freq: Once | INTRAVENOUS | Status: AC
  Administered 2016-04-06: 514 mg via INTRAVENOUS
  Filled 2016-04-06: qty 25.7

## 2016-04-06 NOTE — Progress Notes (Signed)
[address] Work  Clinical Social Work was referred by patient navigator for assessment of psychosocial needs.  Clinical Social Worker met with patient in the infusion room at [facility] to offer support and assess for needs. Patient stated he was doing well and did not express any concerns at this time.  Patient described his support system as his son and daughter.  They also provide transportation to his appointments.  Patient lives next door to his daughter, and she is available to assist as needed.  CSW and patient discussed the importance of support during treatment.  CSW provided information on the support team and support services at [facility].  CSW encouraged patient and family to call with any needs or concerns.        Johnnye Lana, MSW, LCSW, OSW-C Clinical Social Worker [facility] [phone]

## 2016-04-06 NOTE — Patient Instructions (Signed)
[facility] Discharge Instructions for Patients Receiving Chemotherapy  Today you received the following chemotherapy agents:  Oxaliplatin, Leucovorin and 5FU  To help prevent nausea and vomiting after your treatment, we encourage you to take your nausea medication as ordered per MD.   If you develop nausea and vomiting that is not controlled by your nausea medication, call the clinic.   BELOW ARE SYMPTOMS THAT SHOULD BE REPORTED IMMEDIATELY:  *FEVER GREATER THAN 100.5 F  *CHILLS WITH OR WITHOUT FEVER  NAUSEA AND VOMITING THAT IS NOT CONTROLLED WITH YOUR NAUSEA MEDICATION  *UNUSUAL SHORTNESS OF BREATH  *UNUSUAL BRUISING OR BLEEDING  TENDERNESS IN MOUTH AND THROAT WITH OR WITHOUT PRESENCE OF ULCERS  *URINARY PROBLEMS  *BOWEL PROBLEMS  UNUSUAL RASH Items with * indicate a potential emergency and should be followed up as soon as possible.  Feel free to call the clinic you have any questions or concerns. The clinic phone number is [phone].  Please show the [place] at check-in to the Emergency Department and triage nurse.

## 2016-04-06 NOTE — Progress Notes (Signed)
Nutrition follow-up completed with patient receiving second cycle of FOLFOX for metastatic esophageal cancer. Weight decreased and documented as 124.6 pounds January 16. Patient admits to mild nausea but no vomiting after chemotherapy. Reports nausea medication is effective. Reports he is feeling somewhat constipated but has medication to take. Admits to anorexia and weakness. Patient reports he is drinking 3-5 Ensure Plus daily. He complains of taste alterations and difficulty swallowing.  Nutrition diagnosis: Severe malnutrition.  Continues.  Intervention: Educated patient to increase Ensure Plus to 5 supplements daily along with soft foods and other liquids as tolerated. Offered coupons.  However, patient declined. Enforced education on taste alterations. Questions were answered.  Teach back method used.  Monitoring, evaluation, goals:  Patient will tolerate increased calories and protein to minimize weight loss.  Next visit: Tuesday, January 30, during infusion.  **Disclaimer: This note was dictated with voice recognition software. Similar sounding words can inadvertently be transcribed and this note may contain transcription errors which may not have been corrected upon publication of note.**

## 2016-04-06 NOTE — Progress Notes (Signed)
  [facility] OFFICE PROGRESS NOTE   Diagnosis:  Esophagus cancer  INTERVAL HISTORY:   Mr. Braithwaite returns as scheduled. He completed cycle 1 FOLFOX 04/03/2016. He has mild nausea. No vomiting. He continues to note improvement in the dysphagia. He is drinking 3 or 4 nutritional supplements a day. He feels a little stronger. He is mildly constipated. He has stable tingling in the left hand which predated chemotherapy.  Objective:  Vital signs in last 24 hours:  Blood pressure 103/64, pulse 88, temperature 98 F (36.7 C), temperature source Oral, resp. rate 18, height '5\' 8"'$  (1.727 m), weight 124 lb 9.6 oz (56.5 kg), SpO2 100 %.    HEENT: No thrush or ulcers. Resp: Lungs clear bilaterally. Cardio: Regular rate and rhythm. GI: Abdomen soft and nontender. No hepatomegaly. Vascular: No leg edema.  Port-A-Cath without erythema.  Lab Results:  Lab Results  Component Value Date   WBC 7.0 04/06/2016   HGB 9.6 (L) 04/06/2016   HCT 29.8 (L) 04/06/2016   MCV 82.1 04/06/2016   PLT 282 04/06/2016   NEUTROABS 5.4 04/06/2016    Imaging:  No results found.  Medications: I have reviewed the patient's current medications.  Assessment/Plan: 1. Adenocarcinoma the esophagus  upper endoscopy 02/17/2016 confirmed a distal esophagus mass  CT chest 01/30/2016-increased in size of bilateral lung nodules  CT abdomen/pelvis 02/23/2016 distal esophagus mass, gastrohepatic adenopathy, iliac and probable thoracic and lumbar metastases, 1.7 cm aortocaval node, nonspecific lung nodules  Biopsy of a left iliac lesion 03/03/2016 confirmed metastatic adenocarcinoma, TTF-1, cytokeratin 7, cytokeratin 20, and CDX-2positive; morphology similar to the distal esophagus biopsy  Cycle 1 FOLFOX 03/24/2016  Cycle 2 FOLFOX 04/06/2016  2. 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  Prostate cancer treated with external beam radiation  4. COPD  5. Coronary artery disease  6. Peripheral vascular disease  7. Anemia  8. 03/18/2016 Port-A-Cath placement   Disposition: Mr. Kawano appears unchanged. He has completed 1 cycle of FOLFOX. Plan to proceed with cycle 2 today as scheduled.  He has persistent mild hypokalemia. He will increase potassium to twice daily for 3 days and then resume once daily.  He felt better after receiving IV fluids on the day of pump discontinuation following cycle 1 FOLFOX. He would like to receive IV fluids when he returns for the pump discontinuation on 04/08/2016 as well.   He will return for a follow-up visit and cycle 3 FOLFOX in 2 weeks. He will contact the office in the interim with any problems.    Ned Card ANP/GNP-BC   04/06/2016  11:28 AM

## 2016-04-07 ENCOUNTER — Ambulatory Visit: Payer: Medicare Other | Admitting: Nurse Practitioner

## 2016-04-08 ENCOUNTER — Ambulatory Visit (HOSPITAL_BASED_OUTPATIENT_CLINIC_OR_DEPARTMENT_OTHER): Payer: Medicare Other

## 2016-04-08 VITALS — BP 125/71 | HR 79 | Temp 98.1°F | Resp 18

## 2016-04-08 DIAGNOSIS — R1310 Dysphagia, unspecified: Secondary | ICD-10-CM

## 2016-04-08 DIAGNOSIS — C155 Malignant neoplasm of lower third of esophagus: Secondary | ICD-10-CM

## 2016-04-08 MED ORDER — SODIUM CHLORIDE 0.9 % IV SOLN
1000.0000 mL | Freq: Once | INTRAVENOUS | Status: AC
  Administered 2016-04-08: 1000 mL via INTRAVENOUS

## 2016-04-08 MED ORDER — HEPARIN SOD (PORK) LOCK FLUSH 100 UNIT/ML IV SOLN
500.0000 [IU] | Freq: Once | INTRAVENOUS | Status: AC | PRN
  Administered 2016-04-08: 500 [IU]
  Filled 2016-04-08: qty 5

## 2016-04-08 MED ORDER — SODIUM CHLORIDE 0.9% FLUSH
10.0000 mL | INTRAVENOUS | Status: DC | PRN
  Administered 2016-04-08 (×2): 10 mL
  Filled 2016-04-08: qty 10

## 2016-04-08 NOTE — Patient Instructions (Signed)
Dehydration, Adult Dehydration is when there is not enough fluid or water in your body. This happens when you lose more fluids than you take in. Dehydration can range from mild to very bad. It should be treated right away to keep it from getting very bad. Symptoms of mild dehydration may include:   Thirst.  Dry lips.  Slightly dry mouth.  Dry, warm skin.  Dizziness. Symptoms of moderate dehydration may include:   Very dry mouth.  Muscle cramps.  Dark pee (urine). Pee may be the color of tea.  Your body making less pee.  Your eyes making fewer tears.  Heartbeat that is uneven or faster than normal (palpitations).  Headache.  Light-headedness, especially when you stand up from sitting.  Fainting (syncope). Symptoms of very bad dehydration may include:   Changes in skin, such as:  Cold and clammy skin.  Blotchy (mottled) or pale skin.  Skin that does not quickly return to normal after being lightly pinched and let go (poor skin turgor).  Changes in body fluids, such as:  Feeling very thirsty.  Your eyes making fewer tears.  Not sweating when body temperature is high, such as in hot weather.  Your body making very little pee.  Changes in vital signs, such as:  Weak pulse.  Pulse that is more than 100 beats a minute when you are sitting still.  Fast breathing.  Low blood pressure.  Other changes, such as:  Sunken eyes.  Cold hands and feet.  Confusion.  Lack of energy (lethargy).  Trouble waking up from sleep.  Short-term weight loss.  Unconsciousness. Follow these instructions at home:  If told by your doctor, drink an ORS:  Make an ORS by using instructions on the package.  Start by drinking small amounts, about  cup (120 mL) every 5-10 minutes.  Slowly drink more until you have had the amount that your doctor said to have.  Drink enough clear fluid to keep your pee clear or pale yellow. If you were told to drink an ORS, finish the  ORS first, then start slowly drinking clear fluids. Drink fluids such as:  Water. Do not drink only water by itself. Doing that can make the salt (sodium) level in your body get too low (hyponatremia).  Ice chips.  Fruit juice that you have added water to (diluted).  Low-calorie sports drinks.  Avoid:  Alcohol.  Drinks that have a lot of sugar. These include high-calorie sports drinks, fruit juice that does not have water added, and soda.  Caffeine.  Foods that are greasy or have a lot of fat or sugar.  Take over-the-counter and prescription medicines only as told by your doctor.  Do not take salt tablets. Doing that can make the salt level in your body get too high (hypernatremia).  Eat foods that have minerals (electrolytes). Examples include bananas, oranges, potatoes, tomatoes, and spinach.  Keep all follow-up visits as told by your doctor. This is important. Contact a doctor if:  You have belly (abdominal) pain that:  Gets worse.  Stays in one area (localizes).  You have a rash.  You have a stiff neck.  You get angry or annoyed more easily than normal (irritability).  You are more sleepy than normal.  You have a harder time waking up than normal.  You feel:  Weak.  Dizzy.  Very thirsty.  You have peed (urinated) only a small amount of very dark pee during 6-8 hours. Get help right away if:  You   have symptoms of very bad dehydration.  You cannot drink fluids without throwing up (vomiting).  Your symptoms get worse with treatment.  You have a fever.  You have a very bad headache.  You are throwing up or having watery poop (diarrhea) and it:  Gets worse.  Does not go away.  You have blood or something green (bile) in your throw-up.  You have blood in your poop (stool). This may cause poop to look black and tarry.  You have not peed in 6-8 hours.  You pass out (faint).  Your heart rate when you are sitting still is more than 100 beats a  minute.  You have trouble breathing. This information is not intended to replace advice given to you by your health care provider. Make sure you discuss any questions you have with your health care provider. Document Released: 01/02/2009 Document Revised: 09/26/2015 Document Reviewed: 05/02/2015 Elsevier Interactive Patient Education  2017 Elsevier Inc.  

## 2016-04-12 ENCOUNTER — Other Ambulatory Visit: Payer: Self-pay | Admitting: Oncology

## 2016-04-14 ENCOUNTER — Telehealth: Payer: Self-pay | Admitting: *Deleted

## 2016-04-14 ENCOUNTER — Telehealth: Payer: Self-pay | Admitting: Emergency Medicine

## 2016-04-14 NOTE — Telephone Encounter (Signed)
Spoke with daughter she is wanting a lot of medical records on this pt. And I informed her that it would be better for her to go through Medical Records. The phone number was given to her nothing further is needed at this time.

## 2016-04-14 NOTE — Telephone Encounter (Signed)
Oncology Nurse Navigator Documentation  Oncology Nurse Navigator Flowsheets 04/14/2016  Navigator Location [facility]  Referral date to RadOnc/MedOnc -  Navigator Encounter Type Telephone  Telephone Outgoing Call;Patient Update  Abnormal Finding Date -  Confirmed Diagnosis Date -  Treatment Initiated Date -  Patient Visit Type -  Treatment Phase Active Tx--FOLFOX x 2 cycles  Barriers/Navigation Needs Family concerns--daughter concerned with his lack of eating and sleeping and if he should continue treatment  Education Coping with Diagnosis/ Prognosis;Pain/ Symptom Management  Interventions Education--  Referrals -  Coordination of Care -  Education Method Verbal--constipation and increase activity  Support Groups/Services -  Acuity -  Time Spent with Patient 15  Daughter, Magda Paganini had called yesterday worried that her father was not eating and sleeping a lot. Says it takes all he has to just walk to the kitchen. Worried that the chemo may be too hard on him. Called patient to f/u on him: tells navigator he drinks 3-4 fruit smoothies/day (premixed) and additional 2 Ensures/day. Denies any nausea at this time. Had some constipation and then diarrhea after treatment. Last BM was 1/23 and it was normal. Pre-diagnosis his bowels moved QOD. Encouraged him to take laxative if he goes 3 days without BM. He has no mouth sores. Tells RN "I do sleep more than I should". Encouraged him to find things to keep him busy during the day to stay awake and to walk more--try going out when it is warmer. He tells RN that the chemo is his wishes and he wants to fight to stay alive as long as he can and still feel OK. He also told navigator that he has recently increased his Remeron to 30 mg at bedtime. Called daughter and left VM about discussion and also her question about average survival for this cancer stage. Per Dr. Benay Spice, with good response to chemo could live at least a year. If poor response then few  months.

## 2016-04-16 ENCOUNTER — Encounter: Payer: Self-pay | Admitting: Oncology

## 2016-04-16 NOTE — Progress Notes (Signed)
Faxed FMLA paperwork to [employer] on 04/02/16 to fax number [phone] for pt's daughter [name]. Melina Fiddler

## 2016-04-18 ENCOUNTER — Other Ambulatory Visit: Payer: Self-pay | Admitting: Oncology

## 2016-04-19 ENCOUNTER — Telehealth: Payer: Self-pay | Admitting: *Deleted

## 2016-04-19 NOTE — Telephone Encounter (Signed)
Oncology Nurse Navigator Documentation  Oncology Nurse Navigator Flowsheets 04/19/2016  Navigator Location [facility]  Referral date to RadOnc/MedOnc -  Navigator Encounter Type Telephone  Telephone Incoming Call from daughter, [name]  Abnormal Finding Date -  Confirmed Diagnosis Date -  Treatment Initiated Date -  Patient Visit Type -  Treatment Phase -  Barriers/Navigation Needs -  Education -  Interventions -  Referrals -  Coordination of Care -  Education Method -  Support Groups/Services -  Acuity -  Time Spent with Patient -  Daughter feels he is doing no better with the chemo, and possibly worse. He sits in recliner all day and sleeps. Only eats few bites and drinks very little fluids. He can't even take a shower without help. He has fallen X 4 recently in home. Reports he is confused in the mornings. Asking if he is appropriate for Hospice? Would he still be able to get chemo with Hospice? Feels "I am at my wits end" trying to take care of him and work. She is not able to come to appointment tomorrow, but her brother will be there. Is not sure patient understands that he is not going to be cured.  Informed him this message will be forwarded to MD at appointment tomorrow.

## 2016-04-20 ENCOUNTER — Ambulatory Visit (HOSPITAL_BASED_OUTPATIENT_CLINIC_OR_DEPARTMENT_OTHER): Payer: Medicare Other | Admitting: Oncology

## 2016-04-20 ENCOUNTER — Encounter: Payer: Self-pay | Admitting: *Deleted

## 2016-04-20 ENCOUNTER — Ambulatory Visit: Payer: Medicare Other | Admitting: Nutrition

## 2016-04-20 ENCOUNTER — Telehealth: Payer: Self-pay | Admitting: *Deleted

## 2016-04-20 ENCOUNTER — Ambulatory Visit: Payer: Medicare Other

## 2016-04-20 ENCOUNTER — Ambulatory Visit (HOSPITAL_BASED_OUTPATIENT_CLINIC_OR_DEPARTMENT_OTHER): Payer: Medicare Other

## 2016-04-20 ENCOUNTER — Telehealth: Payer: Self-pay | Admitting: Oncology

## 2016-04-20 ENCOUNTER — Other Ambulatory Visit (HOSPITAL_BASED_OUTPATIENT_CLINIC_OR_DEPARTMENT_OTHER): Payer: Medicare Other

## 2016-04-20 VITALS — BP 100/72 | HR 82

## 2016-04-20 VITALS — BP 84/61 | HR 92 | Resp 18 | Ht 68.0 in | Wt 117.6 lb

## 2016-04-20 DIAGNOSIS — R630 Anorexia: Secondary | ICD-10-CM | POA: Diagnosis not present

## 2016-04-20 DIAGNOSIS — D649 Anemia, unspecified: Secondary | ICD-10-CM

## 2016-04-20 DIAGNOSIS — C775 Secondary and unspecified malignant neoplasm of intrapelvic lymph nodes: Secondary | ICD-10-CM | POA: Diagnosis not present

## 2016-04-20 DIAGNOSIS — Z8546 Personal history of malignant neoplasm of prostate: Secondary | ICD-10-CM

## 2016-04-20 DIAGNOSIS — R634 Abnormal weight loss: Secondary | ICD-10-CM

## 2016-04-20 DIAGNOSIS — C155 Malignant neoplasm of lower third of esophagus: Secondary | ICD-10-CM

## 2016-04-20 DIAGNOSIS — Z85118 Personal history of other malignant neoplasm of bronchus and lung: Secondary | ICD-10-CM | POA: Diagnosis not present

## 2016-04-20 DIAGNOSIS — Z95828 Presence of other vascular implants and grafts: Secondary | ICD-10-CM

## 2016-04-20 LAB — CBC WITH DIFFERENTIAL/PLATELET
BASO%: 1 % (ref 0.0–2.0)
BASOS ABS: 0 10*3/uL (ref 0.0–0.1)
EOS ABS: 0.1 10*3/uL (ref 0.0–0.5)
EOS%: 2.7 % (ref 0.0–7.0)
HCT: 30.5 % — ABNORMAL LOW (ref 38.4–49.9)
HGB: 10 g/dL — ABNORMAL LOW (ref 13.0–17.1)
LYMPH%: 23.8 % (ref 14.0–49.0)
MCH: 26.7 pg — AB (ref 27.2–33.4)
MCHC: 32.9 g/dL (ref 32.0–36.0)
MCV: 81 fL (ref 79.3–98.0)
MONO#: 0.4 10*3/uL (ref 0.1–0.9)
MONO%: 8.5 % (ref 0.0–14.0)
NEUT#: 2.8 10*3/uL (ref 1.5–6.5)
NEUT%: 64 % (ref 39.0–75.0)
Platelets: 323 10*3/uL (ref 140–400)
RBC: 3.76 10*6/uL — AB (ref 4.20–5.82)
RDW: 18.4 % — ABNORMAL HIGH (ref 11.0–14.6)
WBC: 4.4 10*3/uL (ref 4.0–10.3)
lymph#: 1.1 10*3/uL (ref 0.9–3.3)

## 2016-04-20 LAB — COMPREHENSIVE METABOLIC PANEL
ALK PHOS: 98 U/L (ref 40–150)
ALT: 11 U/L (ref 0–55)
AST: 16 U/L (ref 5–34)
Albumin: 2.9 g/dL — ABNORMAL LOW (ref 3.5–5.0)
Anion Gap: 11 mEq/L (ref 3–11)
BUN: 15.9 mg/dL (ref 7.0–26.0)
CO2: 27 meq/L (ref 22–29)
Calcium: 9.4 mg/dL (ref 8.4–10.4)
Chloride: 103 mEq/L (ref 98–109)
Creatinine: 0.8 mg/dL (ref 0.7–1.3)
EGFR: 86 mL/min/{1.73_m2} — AB (ref >90)
Glucose: 133 mg/dl (ref 70–140)
POTASSIUM: 3.4 meq/L — AB (ref 3.5–5.1)
SODIUM: 140 meq/L (ref 136–145)
Total Bilirubin: 0.32 mg/dL (ref 0.20–1.20)
Total Protein: 6.7 g/dL (ref 6.4–8.3)

## 2016-04-20 MED ORDER — PREDNISONE 20 MG PO TABS: 20.0000 mg | ORAL_TABLET | Freq: Every day | ORAL | 2 refills | Status: DC

## 2016-04-20 MED ORDER — SODIUM CHLORIDE 0.9 % IV SOLN
INTRAVENOUS | Status: DC
  Administered 2016-04-20: 11:00:00 via INTRAVENOUS

## 2016-04-20 MED ORDER — HEPARIN SOD (PORK) LOCK FLUSH 100 UNIT/ML IV SOLN
500.0000 [IU] | Freq: Once | INTRAVENOUS | Status: AC
  Administered 2016-04-20: 500 [IU] via INTRAVENOUS
  Filled 2016-04-20: qty 5

## 2016-04-20 MED ORDER — SODIUM CHLORIDE 0.9% FLUSH
10.0000 mL | INTRAVENOUS | Status: DC | PRN
  Administered 2016-04-20: 10 mL via INTRAVENOUS
  Filled 2016-04-20: qty 10

## 2016-04-20 NOTE — Patient Instructions (Addendum)
Dr. Benay Spice will order medication for appetite stimulation (Prednisone 20 mg daily--in am with food) IV fluids today--too weak for treatment today Return next week after having CT scan for potential treatment. If scan shows no improvement or progression, consider [facility]. Use your urinal at night to prevent falling Discontinue Zetia (not needed)

## 2016-04-20 NOTE — Progress Notes (Signed)
  [facility] OFFICE PROGRESS NOTE   Diagnosis: Esophagus cancer  INTERVAL HISTORY:   Mr. Zwahlen returns as scheduled. Accompanied by his son. He completed cycle 2 FOLFOX on 04/06/2016. No nausea/vomiting, mouth sores, or diarrhea following chemotherapy. He had cold sensitivity for a few days. He reports anorexia. He stays in bed or on the couch most of the time. He had recent falls and syncope events. No difficulty swallowing.  Objective:  Vital signs in last 24 hours:  Blood pressure (!) 84/61, pulse 92, resp. rate 18, height '5\' 8"'$  (1.727 m), weight 117 lb 9.6 oz (53.3 kg), SpO2 100 %.    HEENT: No thrush or ulcers  Resp: Lungs with decreased breath sounds at the right lower posterior chest, no respiratory distress Cardio: Regular rate and rhythm GI: No hepatosplenomegaly, no mass Vascular: No leg edema Neuro: Alert and oriented, follows commands, ambulates to the examination table without difficulty  Skin: Changes of senile purpura at the dorsum of the hands and lower arm bilaterally   Portacath/PICC-without erythema  Lab Results:  Lab Results  Component Value Date   WBC 4.4 04/20/2016   HGB 10.0 (L) 04/20/2016   HCT 30.5 (L) 04/20/2016   MCV 81.0 04/20/2016   PLT 323 04/20/2016   NEUTROABS 2.8 04/20/2016   Potassium 3.4, BUN 15.9, creatinine 0.8  Medications: I have reviewed the patient's current medications.  Assessment/Plan: 1. Adenocarcinoma the esophagus  upper endoscopy 02/17/2016 confirmed a distal esophagus mass  CT chest 01/30/2016-increased in size of bilateral lung nodules  CT abdomen/pelvis 02/23/2016 distal esophagus mass, gastrohepatic adenopathy, iliac and probable thoracic and lumbar metastases, 1.7 cm aortocaval node, nonspecific lung nodules  Biopsy of a left iliac lesion 03/03/2016 confirmed metastatic adenocarcinoma, TTF-1, cytokeratin 7, cytokeratin 20, and CDX-2positive; morphology similar to the distal esophagus  biopsy  Cycle 1 FOLFOX 03/24/2016  Cycle 2 FOLFOX 04/06/2016  2. 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 Prostate cancer treated with external beam radiation  4. COPD  5. Coronary artery disease  6. Peripheral vascular disease  7. Anemia  8. 03/18/2016 Port-A-Cath placement   Disposition:  Mr. Mogel has completed 2 treatments with FOLFOX. He has tolerated the chemotherapy well, but he continues to have a poor performance status. He has lost weight over the past few weeks and continues to have anorexia.  I discussed treatment options with Mr. Canty and his son. I recommended he consider comfort/hospice care. He wishes to continue chemotherapy. We decided to hold chemotherapy today and administer IV fluids. He will undergo a restaging CT evaluation prior to a follow-up visit in one week.  Mr. Hofland was placed on prednisone as an appetite stimulant.  25 minutes were spent with the patient today. The majority of the time was used for counseling and coordination of care.  [name], MD  04/20/2016  4:56 PM

## 2016-04-20 NOTE — Patient Instructions (Signed)
Dehydration, Adult Dehydration is a condition in which there is not enough fluid or water in the body. This happens when you lose more fluids than you take in. Important organs, such as the kidneys, brain, and heart, cannot function without a proper amount of fluids. Any loss of fluids from the body can lead to dehydration. Dehydration can range from mild to severe. This condition should be treated right away to prevent it from becoming severe. What are the causes? This condition may be caused by:  Vomiting.  Diarrhea.  Excessive sweating, such as from heat exposure or exercise.  Not drinking enough fluid, especially:  When ill.  While doing activity that requires a lot of energy.  Excessive urination.  Fever.  Infection.  Certain medicines, such as medicines that cause the body to lose excess fluid (diuretics).  Inability to access safe drinking water.  Reduced physical ability to get adequate water and food. What increases the risk? This condition is more likely to develop in people:  Who have a poorly controlled long-term (chronic) illness, such as diabetes, heart disease, or kidney disease.  Who are age 65 or older.  Who are disabled.  Who live in a place with high altitude.  Who play endurance sports. What are the signs or symptoms? Symptoms of mild dehydration may include:   Thirst.  Dry lips.  Slightly dry mouth.  Dry, warm skin.  Dizziness. Symptoms of moderate dehydration may include:   Very dry mouth.  Muscle cramps.  Dark urine. Urine may be the color of tea.  Decreased urine production.  Decreased tear production.  Heartbeat that is irregular or faster than normal (palpitations).  Headache.  Light-headedness, especially when you stand up from a sitting position.  Fainting (syncope). Symptoms of severe dehydration may include:   Changes in skin, such as:  Cold and clammy skin.  Blotchy (mottled) or pale skin.  Skin that does  not quickly return to normal after being lightly pinched and released (poor skin turgor).  Changes in body fluids, such as:  Extreme thirst.  No tear production.  Inability to sweat when body temperature is high, such as in hot weather.  Very little urine production.  Changes in vital signs, such as:  Weak pulse.  Pulse that is more than 100 beats a minute when sitting still.  Rapid breathing.  Low blood pressure.  Other changes, such as:  Sunken eyes.  Cold hands and feet.  Confusion.  Lack of energy (lethargy).  Difficulty waking up from sleep.  Short-term weight loss.  Unconsciousness. How is this diagnosed? This condition is diagnosed based on your symptoms and a physical exam. Blood and urine tests may be done to help confirm the diagnosis. How is this treated? Treatment for this condition depends on the severity. Mild or moderate dehydration can often be treated at home. Treatment should be started right away. Do not wait until dehydration becomes severe. Severe dehydration is an emergency and it needs to be treated in a hospital. Treatment for mild dehydration may include:   Drinking more fluids.  Replacing salts and minerals in your blood (electrolytes) that you may have lost. Treatment for moderate dehydration may include:   Drinking an oral rehydration solution (ORS). This is a drink that helps you replace fluids and electrolytes (rehydrate). It can be found at pharmacies and retail stores. Treatment for severe dehydration may include:   Receiving fluids through an IV tube.  Receiving an electrolyte solution through a feeding tube that is   passed through your nose and into your stomach (nasogastric tube, or NG tube).  Correcting any abnormalities in electrolytes.  Treating the underlying cause of dehydration. Follow these instructions at home:  If directed by your health care provider, drink an ORS:  Make an ORS by following instructions on the  package.  Start by drinking small amounts, about  cup (120 mL) every 5-10 minutes.  Slowly increase how much you drink until you have taken the amount recommended by your health care provider.  Drink enough clear fluid to keep your urine clear or pale yellow. If you were told to drink an ORS, finish the ORS first, then start slowly drinking other clear fluids. Drink fluids such as:  Water. Do not drink only water. Doing that can lead to having too little salt (sodium) in the body (hyponatremia).  Ice chips.  Fruit juice that you have added water to (diluted fruit juice).  Low-calorie sports drinks.  Avoid:  Alcohol.  Drinks that contain a lot of sugar. These include high-calorie sports drinks, fruit juice that is not diluted, and soda.  Caffeine.  Foods that are greasy or contain a lot of fat or sugar.  Take over-the-counter and prescription medicines only as told by your health care provider.  Do not take sodium tablets. This can lead to having too much sodium in the body (hypernatremia).  Eat foods that contain a healthy balance of electrolytes, such as bananas, oranges, potatoes, tomatoes, and spinach.  Keep all follow-up visits as told by your health care provider. This is important. Contact a health care provider if:  You have abdominal pain that:  Gets worse.  Stays in one area (localizes).  You have a rash.  You have a stiff neck.  You are more irritable than usual.  You are sleepier or more difficult to wake up than usual.  You feel weak or dizzy.  You feel very thirsty.  You have urinated only a small amount of very dark urine over 6-8 hours. Get help right away if:  You have symptoms of severe dehydration.  You cannot drink fluids without vomiting.  Your symptoms get worse with treatment.  You have a fever.  You have a severe headache.  You have vomiting or diarrhea that:  Gets worse.  Does not go away.  You have blood or green matter  (bile) in your vomit.  You have blood in your stool. This may cause stool to look black and tarry.  You have not urinated in 6-8 hours.  You faint.  Your heart rate while sitting still is over 100 beats a minute.  You have trouble breathing. This information is not intended to replace advice given to you by your health care provider. Make sure you discuss any questions you have with your health care provider. Document Released: 03/08/2005 Document Revised: 10/03/2015 Document Reviewed: 05/02/2015 Elsevier Interactive Patient Education  2017 [employer]  

## 2016-04-20 NOTE — Telephone Encounter (Signed)
Per 1/30 LOS ans staff message I have scheduled appt and notified the scheduler

## 2016-04-20 NOTE — Progress Notes (Signed)
Nutrition follow-up completed with patient receiving chemotherapy for metastatic esophageal cancer. Weight has decreased to 117 pounds down from 124.6 pounds January 16. Patient reports he can drink between 3 and 4 Ensure Plus a day. Denies other nutrition impact symptoms.  However, noted difficulty swallowing in the past as well as taste alterations.  Nutrition diagnosis: Severe malnutrition.  Continues.  Intervention: Recommended patient attempt to increase oral intake to minimize further weight loss. Encouraged patient to rely on Ensure Plus as primary source of nutrition. Offered coupons.  However, patient declined. Questions were answered.  Teach back method used.  Monitoring, evaluation, goals:  Patient will work to increase oral nutrition supplements to minimize further weight loss.  Next visit: To be scheduled as needed.  **Disclaimer: This note was dictated with voice recognition software. Similar sounding words can inadvertently be transcribed and this note may contain transcription errors which may not have been corrected upon publication of note.**

## 2016-04-20 NOTE — Telephone Encounter (Signed)
Appointments scheduled per 1/30 LOS. Patient given AVS report and calendars with future scheduled appointments.

## 2016-04-20 NOTE — Progress Notes (Signed)
Oncology Nurse Navigator Documentation  Oncology Nurse Navigator Flowsheets 04/20/2016  Navigator Location [facility]  Referral date to RadOnc/MedOnc -  Navigator Encounter Type Treatment;Follow-up Appt  Telephone -  Abnormal Finding Date -  Confirmed Diagnosis Date -  Treatment Initiated Date -  Patient Visit Type MedOnc  Treatment Phase Treatment  Barriers/Navigation Needs Education;Coordination of Care  Education Pain/ Symptom Management--Fall Prevention; Prednisone for appetite; Needs to eat and drink to keep up strength up to tolerate chemotherapy  Interventions Education;Coordination of Care--CT scan  Referrals -  Coordination of Care Radiology;Other--sent Prednisone script to his preferred pharmacy  Education Method Verbal;Written;Teach-back  Support Groups/Services -  Acuity Level 2  Time Spent with Patient 30  Scheduled his CT scan for 04/27/16 at 0815/0830 with contrast due at 0630 and 0730. NPO 4 hours prior. Provided written and verbal instructions to his son. Patient understands that if he does not improve or scan shows progression that supportive care with Hospice may be the most appropriate for him. He and his son were agreeable to this approach. Encourage him to use his urinal at night instead of walking to bathroom to avoid falls. He tells RN he will do this.

## 2016-04-21 ENCOUNTER — Other Ambulatory Visit: Payer: Self-pay | Admitting: Cardiovascular Disease

## 2016-04-21 ENCOUNTER — Other Ambulatory Visit: Payer: Self-pay | Admitting: Emergency Medicine

## 2016-04-22 ENCOUNTER — Ambulatory Visit (HOSPITAL_BASED_OUTPATIENT_CLINIC_OR_DEPARTMENT_OTHER): Payer: Medicare Other | Admitting: Nurse Practitioner

## 2016-04-22 VITALS — BP 123/63 | HR 81 | Temp 98.3°F | Resp 16 | Ht 68.0 in

## 2016-04-22 DIAGNOSIS — R634 Abnormal weight loss: Secondary | ICD-10-CM

## 2016-04-22 DIAGNOSIS — R630 Anorexia: Secondary | ICD-10-CM | POA: Diagnosis not present

## 2016-04-22 DIAGNOSIS — C155 Malignant neoplasm of lower third of esophagus: Secondary | ICD-10-CM | POA: Diagnosis not present

## 2016-04-22 DIAGNOSIS — Z95828 Presence of other vascular implants and grafts: Secondary | ICD-10-CM

## 2016-04-22 MED ORDER — HEPARIN SOD (PORK) LOCK FLUSH 100 UNIT/ML IV SOLN
500.0000 [IU] | Freq: Once | INTRAVENOUS | Status: AC | PRN
  Administered 2016-04-22: 500 [IU] via INTRAVENOUS
  Filled 2016-04-22: qty 5

## 2016-04-22 MED ORDER — SODIUM CHLORIDE 0.9% FLUSH
10.0000 mL | INTRAVENOUS | Status: DC | PRN
  Administered 2016-04-22: 10 mL via INTRAVENOUS
  Filled 2016-04-22: qty 10

## 2016-04-22 MED ORDER — SODIUM CHLORIDE 0.9 % IV SOLN
Freq: Once | INTRAVENOUS | Status: AC
  Administered 2016-04-22: 15:00:00 via INTRAVENOUS

## 2016-04-22 NOTE — Progress Notes (Signed)
Generally feels poorly today. Very thin, cachetic in appearance.  Started prednisone this morning for appetite stimulant. Denies problem swallowing or choking. Lightheaded when stands up.

## 2016-04-22 NOTE — Patient Instructions (Signed)
Dehydration, Adult Dehydration is a condition in which there is not enough fluid or water in the body. This happens when you lose more fluids than you take in. Important organs, such as the kidneys, brain, and heart, cannot function without a proper amount of fluids. Any loss of fluids from the body can lead to dehydration. Dehydration can range from mild to severe. This condition should be treated right away to prevent it from becoming severe. What are the causes? This condition may be caused by:  Vomiting.  Diarrhea.  Excessive sweating, such as from heat exposure or exercise.  Not drinking enough fluid, especially:  When ill.  While doing activity that requires a lot of energy.  Excessive urination.  Fever.  Infection.  Certain medicines, such as medicines that cause the body to lose excess fluid (diuretics).  Inability to access safe drinking water.  Reduced physical ability to get adequate water and food. What increases the risk? This condition is more likely to develop in people:  Who have a poorly controlled long-term (chronic) illness, such as diabetes, heart disease, or kidney disease.  Who are age 65 or older.  Who are disabled.  Who live in a place with high altitude.  Who play endurance sports. What are the signs or symptoms? Symptoms of mild dehydration may include:   Thirst.  Dry lips.  Slightly dry mouth.  Dry, warm skin.  Dizziness. Symptoms of moderate dehydration may include:   Very dry mouth.  Muscle cramps.  Dark urine. Urine may be the color of tea.  Decreased urine production.  Decreased tear production.  Heartbeat that is irregular or faster than normal (palpitations).  Headache.  Light-headedness, especially when you stand up from a sitting position.  Fainting (syncope). Symptoms of severe dehydration may include:   Changes in skin, such as:  Cold and clammy skin.  Blotchy (mottled) or pale skin.  Skin that does  not quickly return to normal after being lightly pinched and released (poor skin turgor).  Changes in body fluids, such as:  Extreme thirst.  No tear production.  Inability to sweat when body temperature is high, such as in hot weather.  Very little urine production.  Changes in vital signs, such as:  Weak pulse.  Pulse that is more than 100 beats a minute when sitting still.  Rapid breathing.  Low blood pressure.  Other changes, such as:  Sunken eyes.  Cold hands and feet.  Confusion.  Lack of energy (lethargy).  Difficulty waking up from sleep.  Short-term weight loss.  Unconsciousness. How is this diagnosed? This condition is diagnosed based on your symptoms and a physical exam. Blood and urine tests may be done to help confirm the diagnosis. How is this treated? Treatment for this condition depends on the severity. Mild or moderate dehydration can often be treated at home. Treatment should be started right away. Do not wait until dehydration becomes severe. Severe dehydration is an emergency and it needs to be treated in a hospital. Treatment for mild dehydration may include:   Drinking more fluids.  Replacing salts and minerals in your blood (electrolytes) that you may have lost. Treatment for moderate dehydration may include:   Drinking an oral rehydration solution (ORS). This is a drink that helps you replace fluids and electrolytes (rehydrate). It can be found at pharmacies and retail stores. Treatment for severe dehydration may include:   Receiving fluids through an IV tube.  Receiving an electrolyte solution through a feeding tube that is   passed through your nose and into your stomach (nasogastric tube, or NG tube).  Correcting any abnormalities in electrolytes.  Treating the underlying cause of dehydration. Follow these instructions at home:  If directed by your health care provider, drink an ORS:  Make an ORS by following instructions on the  package.  Start by drinking small amounts, about  cup (120 mL) every 5-10 minutes.  Slowly increase how much you drink until you have taken the amount recommended by your health care provider.  Drink enough clear fluid to keep your urine clear or pale yellow. If you were told to drink an ORS, finish the ORS first, then start slowly drinking other clear fluids. Drink fluids such as:  Water. Do not drink only water. Doing that can lead to having too little salt (sodium) in the body (hyponatremia).  Ice chips.  Fruit juice that you have added water to (diluted fruit juice).  Low-calorie sports drinks.  Avoid:  Alcohol.  Drinks that contain a lot of sugar. These include high-calorie sports drinks, fruit juice that is not diluted, and soda.  Caffeine.  Foods that are greasy or contain a lot of fat or sugar.  Take over-the-counter and prescription medicines only as told by your health care provider.  Do not take sodium tablets. This can lead to having too much sodium in the body (hypernatremia).  Eat foods that contain a healthy balance of electrolytes, such as bananas, oranges, potatoes, tomatoes, and spinach.  Keep all follow-up visits as told by your health care provider. This is important. Contact a health care provider if:  You have abdominal pain that:  Gets worse.  Stays in one area (localizes).  You have a rash.  You have a stiff neck.  You are more irritable than usual.  You are sleepier or more difficult to wake up than usual.  You feel weak or dizzy.  You feel very thirsty.  You have urinated only a small amount of very dark urine over 6-8 hours. Get help right away if:  You have symptoms of severe dehydration.  You cannot drink fluids without vomiting.  Your symptoms get worse with treatment.  You have a fever.  You have a severe headache.  You have vomiting or diarrhea that:  Gets worse.  Does not go away.  You have blood or green matter  (bile) in your vomit.  You have blood in your stool. This may cause stool to look black and tarry.  You have not urinated in 6-8 hours.  You faint.  Your heart rate while sitting still is over 100 beats a minute.  You have trouble breathing. This information is not intended to replace advice given to you by your health care provider. Make sure you discuss any questions you have with your health care provider. Document Released: 03/08/2005 Document Revised: 10/03/2015 Document Reviewed: 05/02/2015 Elsevier Interactive Patient Education  2017 [employer]  

## 2016-04-27 ENCOUNTER — Encounter (HOSPITAL_COMMUNITY): Payer: Self-pay

## 2016-04-27 ENCOUNTER — Ambulatory Visit: Payer: Medicare Other

## 2016-04-27 ENCOUNTER — Ambulatory Visit (HOSPITAL_COMMUNITY)
Admission: RE | Admit: 2016-04-27 | Discharge: 2016-04-27 | Disposition: A | Discharge status: Home / Self Care | Payer: Medicare Other | Source: Ambulatory Visit | Attending: Oncology | Admitting: Oncology

## 2016-04-27 ENCOUNTER — Other Ambulatory Visit: Payer: Medicare Other

## 2016-04-27 ENCOUNTER — Ambulatory Visit: Admission: RE | Admit: 2016-04-27 | Payer: Medicare Other | Source: Ambulatory Visit | Admitting: Radiation Oncology

## 2016-04-27 ENCOUNTER — Ambulatory Visit (HOSPITAL_BASED_OUTPATIENT_CLINIC_OR_DEPARTMENT_OTHER): Payer: Medicare Other | Admitting: Nurse Practitioner

## 2016-04-27 DIAGNOSIS — C7800 Secondary malignant neoplasm of unspecified lung: Secondary | ICD-10-CM

## 2016-04-27 DIAGNOSIS — D649 Anemia, unspecified: Secondary | ICD-10-CM

## 2016-04-27 DIAGNOSIS — I700 Atherosclerosis of aorta: Secondary | ICD-10-CM | POA: Diagnosis not present

## 2016-04-27 DIAGNOSIS — Z85118 Personal history of other malignant neoplasm of bronchus and lung: Secondary | ICD-10-CM | POA: Diagnosis not present

## 2016-04-27 DIAGNOSIS — I714 Abdominal aortic aneurysm, without rupture: Secondary | ICD-10-CM | POA: Insufficient documentation

## 2016-04-27 DIAGNOSIS — C155 Malignant neoplasm of lower third of esophagus: Secondary | ICD-10-CM | POA: Diagnosis not present

## 2016-04-27 DIAGNOSIS — Z8546 Personal history of malignant neoplasm of prostate: Secondary | ICD-10-CM

## 2016-04-27 DIAGNOSIS — R918 Other nonspecific abnormal finding of lung field: Secondary | ICD-10-CM | POA: Diagnosis not present

## 2016-04-27 DIAGNOSIS — C3490 Malignant neoplasm of unspecified part of unspecified bronchus or lung: Secondary | ICD-10-CM | POA: Diagnosis not present

## 2016-04-27 DIAGNOSIS — C7951 Secondary malignant neoplasm of bone: Secondary | ICD-10-CM

## 2016-04-27 DIAGNOSIS — M899 Disorder of bone, unspecified: Secondary | ICD-10-CM | POA: Diagnosis not present

## 2016-04-27 MED ORDER — HEPARIN SOD (PORK) LOCK FLUSH 100 UNIT/ML IV SOLN
INTRAVENOUS | Status: AC
  Filled 2016-04-27: qty 5

## 2016-04-27 MED ORDER — TRAMADOL HCL 50 MG PO TABS: 50.0000 mg | ORAL_TABLET | Freq: Four times a day (QID) | ORAL | 0 refills | Status: DC | PRN

## 2016-04-27 MED ORDER — SODIUM CHLORIDE 0.9 % IJ SOLN
INTRAMUSCULAR | Status: AC
  Filled 2016-04-27: qty 50

## 2016-04-27 MED ORDER — IOPAMIDOL (ISOVUE-300) INJECTION 61%
INTRAVENOUS | Status: AC
  Administered 2016-04-27: 75 mL
  Filled 2016-04-27: qty 75

## 2016-04-27 NOTE — Progress Notes (Addendum)
[name] OFFICE PROGRESS NOTE   Diagnosis: Esophagus cancer   INTERVAL HISTORY:   [name] returns as scheduled. He completed cycle 2 FOLFOX 04/06/2016. Cycle 3 was held last week due to a poor performance status and he was referred for restaging CT scans.  Appetite is unchanged. He has gained some weight since his last visit. Energy level is poor. He estimates remaining in bed or on the couch 75% of the time. He continues to be dizzy with position change. Fluid intake is suboptimal. He has had no falls over the past week. No nausea or vomiting. Bowels alternate constipation/diarrhea. He denies dysphagia. He has pain at the left lower shoulder blade.  Objective:  Vital signs in last 24 hours:  Blood pressure (!) 93/49, pulse 79, temperature 97.6 F (36.4 C), temperature source Oral, resp. rate 18, height '5\' 8"'$  (1.727 m), weight 120 lb 12.8 oz (54.8 kg), SpO2 100 %.    HEENT: No thrush or ulcers. Mucous membranes are moist. Resp: Lungs with bilateral rhonchi. No respiratory distress. Cardio: Regular rate and rhythm. GI: No organomegaly. No mass. Vascular: No leg edema. Neuro: Alert and oriented.  Skin: Changes of senile purpura dorsum of the hands and lower arm bilaterally.  Port-A-Cath without erythema.  Lab Results:  Lab Results  Component Value Date   WBC 4.4 04/20/2016   HGB 10.0 (L) 04/20/2016   HCT 30.5 (L) 04/20/2016   MCV 81.0 04/20/2016   PLT 323 04/20/2016   NEUTROABS 2.8 04/20/2016    Imaging:  Ct Chest W Contrast  Result Date: 04/27/2016 CLINICAL DATA:  Restaging lung cancer. Initial diagnosis 2016. History of prostate cancer 2008. EXAM: CT CHEST, ABDOMEN, AND PELVIS WITH CONTRAST TECHNIQUE: Multidetector CT imaging of the chest, abdomen and pelvis was performed following the standard protocol during bolus administration of intravenous contrast. CONTRAST:  63m ISOVUE-300 IOPAMIDOL (ISOVUE-300) INJECTION 61% COMPARISON:  Chest CT 01/30/2016  and CT abdomen/ pelvis 02/23/2016 FINDINGS: CT CHEST FINDINGS Chest wall: Left-sided Port-A-Cath is stable. No chest wall mass, supraclavicular or axillary lymphadenopathy. The thyroid gland is grossly normal. Cardiovascular: The heart is normal in size. No pericardial effusion. Stable tortuosity, ectasia and calcification of the thoracic aorta. No dissection. The branch vessels are patent. Dense three-vessel coronary artery calcifications along with possible stents. Enlarged pulmonary artery suggesting pulmonary hypertension. No central pulmonary emboli. Mediastinum/Nodes: No mediastinal or hilar mass or lymphadenopathy. Small scattered lymph nodes are stable. Mild diffuse distal esophageal wall thickening could be radiation related. Lungs/Pleura: Stable emphysematous changes and pulmonary scarring. Apical scarring changes appears stable. Enlarging right upper lobe pulmonary nodule on image number 42. It measures 14.5 by 12 mm and previously measured 12.5 x 9.5 mm. Smaller nodules slightly more inferiorly in the right upper lobe also enlarged. The larger nodule measures 5 mm on image number 55 and previously measured 3 mm. 6 mm left upper lobe nodule on image number 69 previously measured 4.5 mm. Right lower lobe nodule on image number 86 measures 7.5 mm and previously measured 7 mm. Multiple other smaller pulmonary nodules are noted. No definite new nodules. Small right pleural effusion persists. Musculoskeletal: Progressive osseous metastatic disease. The left first rib lesion has enlarged and there is an associated soft tissue component. This lesion measures 3.8 x 2.9 cm. 2 cm lesion involving the right aspect of T4 has enlarged slightly. Numerous other vertebral body lesions are new or have enlarged. Rib lesions are also identified. Lytic lesion involving the inferior aspect of the left scapula  the is new. CT ABDOMEN PELVIS FINDINGS Hepatobiliary: No definite hepatic lesions to suggest metastatic disease. Small  low-attenuation lesion in segment 6 is stable and likely a cyst. Gallbladder is unremarkable. No common bile duct dilatation. Pancreas: No mass, inflammation or ductal dilatation. Spleen: Normal size.  No focal lesions. Adrenals/Urinary Tract: Mild thickening of the left adrenal gland without discrete mass. Both kidneys are stable. Large left renal cyst displacing the kidney medially and anteriorly. Stomach/Bowel: The stomach, duodenum, small bowel and colon are grossly normal. No obvious inflammatory process, mass lesion or obstructive findings. Vascular/Lymphatic: The abdominal aortic aneurysm is again demonstrated. It measures 4.3 x 4.1 cm on image number 77. Advanced atherosclerotic calcifications involving the aorta and branch vessels. No dissection. Stable calcified low-attenuation lymph nodes in the gastrohepatic ligament area. Reproductive: The prostate gland and seminal vesicles are unremarkable. Surgical changes are noted. Other: No pelvic mass or adenopathy. No free pelvic fluid collections. No inguinal mass or adenopathy. No abdominal wall hernia or subcutaneous lesions. Musculoskeletal: Numerous bone lesions are again identified. The largest lesion involves the left iliac crest. Associated soft tissue component. This has enlarged. It measures 4.4 cm and previously measured 3.4 cm. Numerous other small lesions are noted. There is a lesion involving the lower aspect of the left greater trochanter region which has enlarged. It measures 2.5 cm and previously measured 2.2 cm. Probably at risk for pathologic fracture. IMPRESSION: 1. Slight interval progression of pulmonary metastatic nodules as described above. 2. Progressive osseous metastatic disease. Enlarging left femoral greater trochanter lesion could be at risk for pathologic fracture 3. Stable advanced atherosclerotic calcifications involving the thoracic and abdominal aorta and branch vessels and stable infrarenal abdominal aortic aneurysm.  Electronically Signed   By: Marijo Sanes M.D.   On: 04/27/2016 10:39   Ct Abdomen Pelvis W Contrast  Result Date: 04/27/2016 CLINICAL DATA:  Restaging lung cancer. Initial diagnosis 2016. History of prostate cancer 2008. EXAM: CT CHEST, ABDOMEN, AND PELVIS WITH CONTRAST TECHNIQUE: Multidetector CT imaging of the chest, abdomen and pelvis was performed following the standard protocol during bolus administration of intravenous contrast. CONTRAST:  57m ISOVUE-300 IOPAMIDOL (ISOVUE-300) INJECTION 61% COMPARISON:  Chest CT 01/30/2016 and CT abdomen/ pelvis 02/23/2016 FINDINGS: CT CHEST FINDINGS Chest wall: Left-sided Port-A-Cath is stable. No chest wall mass, supraclavicular or axillary lymphadenopathy. The thyroid gland is grossly normal. Cardiovascular: The heart is normal in size. No pericardial effusion. Stable tortuosity, ectasia and calcification of the thoracic aorta. No dissection. The branch vessels are patent. Dense three-vessel coronary artery calcifications along with possible stents. Enlarged pulmonary artery suggesting pulmonary hypertension. No central pulmonary emboli. Mediastinum/Nodes: No mediastinal or hilar mass or lymphadenopathy. Small scattered lymph nodes are stable. Mild diffuse distal esophageal wall thickening could be radiation related. Lungs/Pleura: Stable emphysematous changes and pulmonary scarring. Apical scarring changes appears stable. Enlarging right upper lobe pulmonary nodule on image number 42. It measures 14.5 by 12 mm and previously measured 12.5 x 9.5 mm. Smaller nodules slightly more inferiorly in the right upper lobe also enlarged. The larger nodule measures 5 mm on image number 55 and previously measured 3 mm. 6 mm left upper lobe nodule on image number 69 previously measured 4.5 mm. Right lower lobe nodule on image number 86 measures 7.5 mm and previously measured 7 mm. Multiple other smaller pulmonary nodules are noted. No definite new nodules. Small right pleural  effusion persists. Musculoskeletal: Progressive osseous metastatic disease. The left first rib lesion has enlarged and there is an associated soft tissue  component. This lesion measures 3.8 x 2.9 cm. 2 cm lesion involving the right aspect of T4 has enlarged slightly. Numerous other vertebral body lesions are new or have enlarged. Rib lesions are also identified. Lytic lesion involving the inferior aspect of the left scapula the is new. CT ABDOMEN PELVIS FINDINGS Hepatobiliary: No definite hepatic lesions to suggest metastatic disease. Small low-attenuation lesion in segment 6 is stable and likely a cyst. Gallbladder is unremarkable. No common bile duct dilatation. Pancreas: No mass, inflammation or ductal dilatation. Spleen: Normal size.  No focal lesions. Adrenals/Urinary Tract: Mild thickening of the left adrenal gland without discrete mass. Both kidneys are stable. Large left renal cyst displacing the kidney medially and anteriorly. Stomach/Bowel: The stomach, duodenum, small bowel and colon are grossly normal. No obvious inflammatory process, mass lesion or obstructive findings. Vascular/Lymphatic: The abdominal aortic aneurysm is again demonstrated. It measures 4.3 x 4.1 cm on image number 77. Advanced atherosclerotic calcifications involving the aorta and branch vessels. No dissection. Stable calcified low-attenuation lymph nodes in the gastrohepatic ligament area. Reproductive: The prostate gland and seminal vesicles are unremarkable. Surgical changes are noted. Other: No pelvic mass or adenopathy. No free pelvic fluid collections. No inguinal mass or adenopathy. No abdominal wall hernia or subcutaneous lesions. Musculoskeletal: Numerous bone lesions are again identified. The largest lesion involves the left iliac crest. Associated soft tissue component. This has enlarged. It measures 4.4 cm and previously measured 3.4 cm. Numerous other small lesions are noted. There is a lesion involving the lower aspect of  the left greater trochanter region which has enlarged. It measures 2.5 cm and previously measured 2.2 cm. Probably at risk for pathologic fracture. IMPRESSION: 1. Slight interval progression of pulmonary metastatic nodules as described above. 2. Progressive osseous metastatic disease. Enlarging left femoral greater trochanter lesion could be at risk for pathologic fracture 3. Stable advanced atherosclerotic calcifications involving the thoracic and abdominal aorta and branch vessels and stable infrarenal abdominal aortic aneurysm. Electronically Signed   By: Marijo Sanes M.D.   On: 04/27/2016 10:39    Medications: I have reviewed the patient's current medications.  Assessment/Plan: 1. Adenocarcinoma the esophagus  upper endoscopy 02/17/2016 confirmed a distal esophagus mass  CT chest 01/30/2016-increased in size of bilateral lung nodules  CT abdomen/pelvis 02/23/2016 distal esophagus mass, gastrohepatic adenopathy, iliac and probable thoracic and lumbar metastases, 1.7 cm aortocaval node, nonspecific lung nodules  Biopsy of a left iliac lesion 03/03/2016 confirmed metastatic adenocarcinoma, TTF-1, cytokeratin 7, cytokeratin 20, and CDX-2positive; morphology similar to the distal esophagus biopsy  Cycle 1 FOLFOX 03/24/2016  Cycle 2 FOLFOX 04/06/2016  Restaging CTs 04/27/2016-slight interval progression of pulmonary metastatic disease; progressive osseous metastatic disease. Enlarging left femoral greater trochanter lesion could be at risk for pathologic fracture.  2. 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 Prostate cancer treated with external beam radiation  4. COPD  5. Coronary artery disease  6. Peripheral vascular disease  7. Anemia  8. 03/18/2016 Port-A-Cath placement   Disposition: [name] has completed 2 cycles of FOLFOX. The restaging CT  evaluation shows evidence of progression and his performance status continues to be poor. Dr. Benay Spice recommends a supportive care approach with a hospice referral. [name] would like to proceed with additional chemotherapy. Dr. Benay Spice discussed weekly Taxol (3 weeks on/1 week off). We reviewed potential toxicities including myelosuppression, nausea, hair loss, neuropathy, allergic reaction. [name] is in agreement with Taxol as  outlined above. We discussed the steroid premedication to diminish the chance of an allergic reaction. He is currently taking prednisone 20 mg daily and will continue the same. We did not prescribe additional oral steroids.  We reviewed that he is at risk for a pathologic left hip/femur fracture. We made a referral to radiation oncology.  He has pain at the left lower shoulder blade. The CT scan showed a new lesion involving the inferior aspect of the left scapula. He was provided with a new prescription for tramadol at today's visit.  He will return to begin Taxol in one week. We will see him in follow-up prior to proceeding with treatment that day.  Patient seen with Dr. Benay Spice. CT images reviewed on the computer with [name] and his daughter. 30 minutes were spent face-to-face at today's visit with the majority of that time involved in counseling/coordination of care.    Ned Card ANP/GNP-BC   04/27/2016  11:16 AM  This was a shared visit with Ned Card. [name] was interviewed and examined. We reviewed the CT images with [name] and his daughter. He will be referred to radiation oncology to consider palliative radiation to the left trochanter. I contacted radiation oncology and they will see him this week. He does not appear to be a candidate for prophylactic orthopedic surgery.  We discussed hospice care. [name] would like to to continue chemotherapy. He agrees to a trial of weekly Taxol. A new chemotherapy plan was entered.  Julieanne Manson,  M.D.

## 2016-04-27 NOTE — Progress Notes (Signed)
DISCONTINUE ON PATHWAY REGIMEN - Gastroesophageal  GEOS3: mFOLFOX6 q14 Days Until Progression or Unacceptable Toxicity   A cycle is every 14 days:     Oxaliplatin (Eloxatin(R)) 85 mg/m2 in 250 mL D5W IV over 2 hours day 1, q14 days Dose Mod: None     Leucovorin 400 mg/m2 in 250 mL D5W IV over 2 hours day 1, q14 days, followed immediately by Dose Mod: None     5-Fluorouracil 400 mg/m2 IV bolus over 2-4 minutes day 1, q14 days Dose Mod: None     5-Fluorouracil 2,400 mg/m2 in _____mL NS CIV as a 46 hour infusion starting on day 1, q14 days Dose Mod: None Additional Orders: **Note: order sheet contains two q14 day cycles**  **Always confirm dose/schedule in your pharmacy ordering system**    REASON: Disease Progression PRIOR TREATMENT: GEOS3: mFOLFOX6 q14 Days Until Progression or Unacceptable Toxicity TREATMENT RESPONSE: Progressive Disease (PD)  START ON PATHWAY REGIMEN - Gastroesophageal  GEOS14: Ramucirumab 8 mg/kg Days 1, 15 + Paclitaxel 80 mg/m2 Days 1, 8, 15 q28 Days Until Progression or Unacceptable Toxicity   A cycle is every 28 days:     Ramucirumab (Cyramza (R)) 8 mg/kg in 250 mL NS IV over 60 min q2 weeks, PRIOR TO PACLITAXEL.  Dilute in NS only. Urine protein check recommended at baseline and periodically during therapy Dose Mod: None     Paclitaxel (Taxol(R)) 80 mg/m2 in 250 mL NS IV over 60 minutes on days 1, 8, and 15 Dose Mod: None  **Always confirm dose/schedule in your pharmacy ordering system**    Patient Characteristics: Distant Metastases (cM1/pM1) / Locally Recurrent Disease, Adenocarcinoma - Esophageal, GE Junction, and Gastric, Second Line, MSS / pMMR or MSI Unknown Histology: Adenocarcinoma Disease Classification: Esophageal Therapeutic Status: Distant Metastases (No Additional Staging) Line of therapy: Second Line Would you be surprised if this patient died  in the next year? I would NOT be surprised if this patient died in the next  year Microsatellite/Mismatch Repair Status: Unknown  Intent of Therapy: Non-Curative / Palliative Intent, Discussed with Patient

## 2016-04-29 ENCOUNTER — Ambulatory Visit
Admission: RE | Admit: 2016-04-29 | Discharge: 2016-04-29 | Disposition: A | Discharge status: Home / Self Care | Payer: Medicare Other | Source: Ambulatory Visit | Attending: Radiation Oncology | Admitting: Radiation Oncology

## 2016-04-29 DIAGNOSIS — K219 Gastro-esophageal reflux disease without esophagitis: Secondary | ICD-10-CM | POA: Diagnosis not present

## 2016-04-29 DIAGNOSIS — R1310 Dysphagia, unspecified: Secondary | ICD-10-CM | POA: Diagnosis not present

## 2016-04-29 DIAGNOSIS — I739 Peripheral vascular disease, unspecified: Secondary | ICD-10-CM | POA: Diagnosis not present

## 2016-04-29 DIAGNOSIS — C3411 Malignant neoplasm of upper lobe, right bronchus or lung: Secondary | ICD-10-CM | POA: Diagnosis not present

## 2016-04-29 DIAGNOSIS — C155 Malignant neoplasm of lower third of esophagus: Secondary | ICD-10-CM

## 2016-04-29 DIAGNOSIS — M1990 Unspecified osteoarthritis, unspecified site: Secondary | ICD-10-CM | POA: Diagnosis not present

## 2016-04-29 DIAGNOSIS — Z882 Allergy status to sulfonamides status: Secondary | ICD-10-CM | POA: Diagnosis not present

## 2016-04-29 DIAGNOSIS — C7951 Secondary malignant neoplasm of bone: Secondary | ICD-10-CM | POA: Diagnosis not present

## 2016-04-29 DIAGNOSIS — Z880 Allergy status to penicillin: Secondary | ICD-10-CM | POA: Diagnosis not present

## 2016-04-29 DIAGNOSIS — Z823 Family history of stroke: Secondary | ICD-10-CM | POA: Diagnosis not present

## 2016-04-29 DIAGNOSIS — Z510 Encounter for antineoplastic radiation therapy: Secondary | ICD-10-CM | POA: Diagnosis not present

## 2016-04-29 DIAGNOSIS — I2510 Atherosclerotic heart disease of native coronary artery without angina pectoris: Secondary | ICD-10-CM | POA: Diagnosis not present

## 2016-04-29 DIAGNOSIS — I252 Old myocardial infarction: Secondary | ICD-10-CM | POA: Diagnosis not present

## 2016-04-29 DIAGNOSIS — Z8673 Personal history of transient ischemic attack (TIA), and cerebral infarction without residual deficits: Secondary | ICD-10-CM | POA: Diagnosis not present

## 2016-04-29 DIAGNOSIS — J449 Chronic obstructive pulmonary disease, unspecified: Secondary | ICD-10-CM | POA: Diagnosis not present

## 2016-04-29 DIAGNOSIS — Z8249 Family history of ischemic heart disease and other diseases of the circulatory system: Secondary | ICD-10-CM | POA: Diagnosis not present

## 2016-04-29 DIAGNOSIS — E785 Hyperlipidemia, unspecified: Secondary | ICD-10-CM | POA: Diagnosis not present

## 2016-04-29 DIAGNOSIS — Z800 Family history of malignant neoplasm of digestive organs: Secondary | ICD-10-CM | POA: Diagnosis not present

## 2016-04-29 DIAGNOSIS — Z923 Personal history of irradiation: Secondary | ICD-10-CM | POA: Diagnosis not present

## 2016-04-29 DIAGNOSIS — I255 Ischemic cardiomyopathy: Secondary | ICD-10-CM | POA: Diagnosis not present

## 2016-04-29 DIAGNOSIS — I714 Abdominal aortic aneurysm, without rupture: Secondary | ICD-10-CM | POA: Diagnosis not present

## 2016-04-29 DIAGNOSIS — I10 Essential (primary) hypertension: Secondary | ICD-10-CM | POA: Diagnosis not present

## 2016-05-02 ENCOUNTER — Other Ambulatory Visit: Payer: Self-pay | Admitting: Oncology

## 2016-05-02 NOTE — Progress Notes (Signed)
  Radiation Oncology         [phone] ________________________________  Name: [name] MRN: [id number]  Date: 04/29/2016  DOB: Aug 23, 1940  SIMULATION AND TREATMENT PLANNING NOTE    ICD-9-CM ICD-10-CM   1. Cancer of lower third of esophagus (HCC) 150.5 C15.5     DIAGNOSIS:  Metastatic esophageal cancer  NARRATIVE:  The patient was brought to the [facility].  Identity was confirmed.  All relevant records and images related to the planned course of therapy were reviewed.  The patient freely provided informed written consent to proceed with treatment after reviewing the details related to the planned course of therapy. The consent form was witnessed and verified by the simulation staff.  Then, the patient was set-up in a stable reproducible  supine position for radiation therapy.  CT images were obtained.  Surface markings were placed.  The CT images were loaded into the planning software.  Then the target and avoidance structures were contoured.  Treatment planning then occurred.  The radiation prescription was entered and confirmed.  Then, I designed and supervised the construction of a total of 6 medically necessary complex treatment devices.  I have requested : 3D Simulation  I have requested a DVH of the following structures: lungs, GTV, PTV, GTV2.  I have ordered:CBC  PLAN:  The patient will receive 20 Gy in 5 fractions directed at the left scapula area and left proximal femur ( 2 separate isocenters  -----------------------------------  Blair Promise, PhD, MD

## 2016-05-03 ENCOUNTER — Ambulatory Visit
Admission: RE | Admit: 2016-05-03 | Discharge: 2016-05-03 | Disposition: A | Discharge status: Home / Self Care | Payer: Medicare Other | Source: Ambulatory Visit | Attending: Radiation Oncology | Admitting: Radiation Oncology

## 2016-05-03 DIAGNOSIS — C3411 Malignant neoplasm of upper lobe, right bronchus or lung: Secondary | ICD-10-CM | POA: Diagnosis not present

## 2016-05-03 DIAGNOSIS — M1990 Unspecified osteoarthritis, unspecified site: Secondary | ICD-10-CM | POA: Diagnosis not present

## 2016-05-03 DIAGNOSIS — J449 Chronic obstructive pulmonary disease, unspecified: Secondary | ICD-10-CM | POA: Diagnosis not present

## 2016-05-03 DIAGNOSIS — C155 Malignant neoplasm of lower third of esophagus: Secondary | ICD-10-CM

## 2016-05-03 DIAGNOSIS — E785 Hyperlipidemia, unspecified: Secondary | ICD-10-CM | POA: Diagnosis not present

## 2016-05-03 DIAGNOSIS — I252 Old myocardial infarction: Secondary | ICD-10-CM | POA: Diagnosis not present

## 2016-05-03 DIAGNOSIS — I10 Essential (primary) hypertension: Secondary | ICD-10-CM | POA: Diagnosis not present

## 2016-05-03 DIAGNOSIS — Z8249 Family history of ischemic heart disease and other diseases of the circulatory system: Secondary | ICD-10-CM | POA: Diagnosis not present

## 2016-05-03 DIAGNOSIS — I2510 Atherosclerotic heart disease of native coronary artery without angina pectoris: Secondary | ICD-10-CM | POA: Diagnosis not present

## 2016-05-03 DIAGNOSIS — Z923 Personal history of irradiation: Secondary | ICD-10-CM | POA: Diagnosis not present

## 2016-05-03 DIAGNOSIS — Z880 Allergy status to penicillin: Secondary | ICD-10-CM | POA: Diagnosis not present

## 2016-05-03 DIAGNOSIS — I714 Abdominal aortic aneurysm, without rupture: Secondary | ICD-10-CM | POA: Diagnosis not present

## 2016-05-03 DIAGNOSIS — I739 Peripheral vascular disease, unspecified: Secondary | ICD-10-CM | POA: Diagnosis not present

## 2016-05-03 DIAGNOSIS — Z823 Family history of stroke: Secondary | ICD-10-CM | POA: Diagnosis not present

## 2016-05-03 DIAGNOSIS — Z800 Family history of malignant neoplasm of digestive organs: Secondary | ICD-10-CM | POA: Diagnosis not present

## 2016-05-03 DIAGNOSIS — K219 Gastro-esophageal reflux disease without esophagitis: Secondary | ICD-10-CM | POA: Diagnosis not present

## 2016-05-03 DIAGNOSIS — Z510 Encounter for antineoplastic radiation therapy: Secondary | ICD-10-CM | POA: Diagnosis not present

## 2016-05-03 DIAGNOSIS — Z8673 Personal history of transient ischemic attack (TIA), and cerebral infarction without residual deficits: Secondary | ICD-10-CM | POA: Diagnosis not present

## 2016-05-03 DIAGNOSIS — I255 Ischemic cardiomyopathy: Secondary | ICD-10-CM | POA: Diagnosis not present

## 2016-05-03 DIAGNOSIS — R1310 Dysphagia, unspecified: Secondary | ICD-10-CM | POA: Diagnosis not present

## 2016-05-03 DIAGNOSIS — Z882 Allergy status to sulfonamides status: Secondary | ICD-10-CM | POA: Diagnosis not present

## 2016-05-03 DIAGNOSIS — C7951 Secondary malignant neoplasm of bone: Secondary | ICD-10-CM | POA: Diagnosis not present

## 2016-05-03 NOTE — Progress Notes (Signed)
  Radiation Oncology         [phone] ________________________________  Name: [name] MRN: [id number]  Date: 05/03/2016  DOB: 1940/11/26  Simulation Verification Note    ICD-9-CM ICD-10-CM   1. Cancer of lower third of esophagus (HCC) 150.5 C15.5     Status: outpatient  NARRATIVE: The patient was brought to the treatment unit and placed in the planned treatment position. The clinical setup was verified. Then port films were obtained and uploaded to the radiation oncology medical record software.  The treatment beams were carefully compared against the planned radiation fields. The position location and shape of the radiation fields was reviewed. They targeted volume of tissue appears to be appropriately covered by the radiation beams. Organs at risk appear to be excluded as planned.  Based on my personal review, I approved the simulation verification. The patient's treatment will proceed as planned.  -----------------------------------  Blair Promise, PhD, MD

## 2016-05-04 ENCOUNTER — Ambulatory Visit
Admission: RE | Admit: 2016-05-04 | Discharge: 2016-05-04 | Disposition: A | Discharge status: Home / Self Care | Payer: Medicare Other | Source: Ambulatory Visit | Attending: Radiation Oncology | Admitting: Radiation Oncology

## 2016-05-04 ENCOUNTER — Ambulatory Visit (HOSPITAL_BASED_OUTPATIENT_CLINIC_OR_DEPARTMENT_OTHER): Payer: Medicare Other | Admitting: Oncology

## 2016-05-04 ENCOUNTER — Encounter: Payer: Self-pay | Admitting: Radiation Oncology

## 2016-05-04 ENCOUNTER — Ambulatory Visit: Payer: Medicare Other

## 2016-05-04 ENCOUNTER — Other Ambulatory Visit (HOSPITAL_BASED_OUTPATIENT_CLINIC_OR_DEPARTMENT_OTHER): Payer: Medicare Other

## 2016-05-04 ENCOUNTER — Other Ambulatory Visit: Payer: Self-pay | Admitting: *Deleted

## 2016-05-04 ENCOUNTER — Telehealth: Payer: Self-pay | Admitting: *Deleted

## 2016-05-04 ENCOUNTER — Ambulatory Visit (HOSPITAL_BASED_OUTPATIENT_CLINIC_OR_DEPARTMENT_OTHER): Payer: Medicare Other

## 2016-05-04 ENCOUNTER — Inpatient Hospital Stay
Admission: RE | Admit: 2016-05-04 | Discharge: 2016-05-04 | Disposition: A | Discharge status: Home / Self Care | Payer: Self-pay | Source: Ambulatory Visit | Attending: Radiation Oncology | Admitting: Radiation Oncology

## 2016-05-04 VITALS — BP 128/91 | HR 81 | Temp 97.9°F | Resp 16

## 2016-05-04 VITALS — BP 91/54 | HR 85 | Temp 98.5°F | Resp 18 | Ht 68.0 in | Wt 123.8 lb

## 2016-05-04 VITALS — BP 135/83 | HR 83 | Temp 98.2°F | Ht 68.0 in | Wt 123.0 lb

## 2016-05-04 DIAGNOSIS — Z880 Allergy status to penicillin: Secondary | ICD-10-CM | POA: Diagnosis not present

## 2016-05-04 DIAGNOSIS — D649 Anemia, unspecified: Secondary | ICD-10-CM

## 2016-05-04 DIAGNOSIS — Z8546 Personal history of malignant neoplasm of prostate: Secondary | ICD-10-CM

## 2016-05-04 DIAGNOSIS — C155 Malignant neoplasm of lower third of esophagus: Secondary | ICD-10-CM

## 2016-05-04 DIAGNOSIS — I255 Ischemic cardiomyopathy: Secondary | ICD-10-CM | POA: Diagnosis not present

## 2016-05-04 DIAGNOSIS — C3411 Malignant neoplasm of upper lobe, right bronchus or lung: Secondary | ICD-10-CM | POA: Diagnosis not present

## 2016-05-04 DIAGNOSIS — M1990 Unspecified osteoarthritis, unspecified site: Secondary | ICD-10-CM | POA: Diagnosis not present

## 2016-05-04 DIAGNOSIS — I739 Peripheral vascular disease, unspecified: Secondary | ICD-10-CM | POA: Diagnosis not present

## 2016-05-04 DIAGNOSIS — E785 Hyperlipidemia, unspecified: Secondary | ICD-10-CM | POA: Diagnosis not present

## 2016-05-04 DIAGNOSIS — Z5111 Encounter for antineoplastic chemotherapy: Secondary | ICD-10-CM

## 2016-05-04 DIAGNOSIS — J449 Chronic obstructive pulmonary disease, unspecified: Secondary | ICD-10-CM | POA: Diagnosis not present

## 2016-05-04 DIAGNOSIS — I252 Old myocardial infarction: Secondary | ICD-10-CM | POA: Diagnosis not present

## 2016-05-04 DIAGNOSIS — R1310 Dysphagia, unspecified: Secondary | ICD-10-CM | POA: Diagnosis not present

## 2016-05-04 DIAGNOSIS — Z8249 Family history of ischemic heart disease and other diseases of the circulatory system: Secondary | ICD-10-CM | POA: Diagnosis not present

## 2016-05-04 DIAGNOSIS — Z823 Family history of stroke: Secondary | ICD-10-CM | POA: Diagnosis not present

## 2016-05-04 DIAGNOSIS — Z510 Encounter for antineoplastic radiation therapy: Secondary | ICD-10-CM | POA: Diagnosis not present

## 2016-05-04 DIAGNOSIS — Z882 Allergy status to sulfonamides status: Secondary | ICD-10-CM | POA: Diagnosis not present

## 2016-05-04 DIAGNOSIS — I10 Essential (primary) hypertension: Secondary | ICD-10-CM | POA: Diagnosis not present

## 2016-05-04 DIAGNOSIS — C7951 Secondary malignant neoplasm of bone: Secondary | ICD-10-CM | POA: Diagnosis not present

## 2016-05-04 DIAGNOSIS — Z923 Personal history of irradiation: Secondary | ICD-10-CM | POA: Diagnosis not present

## 2016-05-04 DIAGNOSIS — C7800 Secondary malignant neoplasm of unspecified lung: Secondary | ICD-10-CM

## 2016-05-04 DIAGNOSIS — Z85118 Personal history of other malignant neoplasm of bronchus and lung: Secondary | ICD-10-CM | POA: Diagnosis not present

## 2016-05-04 DIAGNOSIS — K219 Gastro-esophageal reflux disease without esophagitis: Secondary | ICD-10-CM | POA: Diagnosis not present

## 2016-05-04 DIAGNOSIS — I714 Abdominal aortic aneurysm, without rupture: Secondary | ICD-10-CM | POA: Diagnosis not present

## 2016-05-04 DIAGNOSIS — I2510 Atherosclerotic heart disease of native coronary artery without angina pectoris: Secondary | ICD-10-CM | POA: Diagnosis not present

## 2016-05-04 DIAGNOSIS — Z800 Family history of malignant neoplasm of digestive organs: Secondary | ICD-10-CM | POA: Diagnosis not present

## 2016-05-04 DIAGNOSIS — Z95828 Presence of other vascular implants and grafts: Secondary | ICD-10-CM

## 2016-05-04 DIAGNOSIS — Z8673 Personal history of transient ischemic attack (TIA), and cerebral infarction without residual deficits: Secondary | ICD-10-CM | POA: Diagnosis not present

## 2016-05-04 LAB — CBC WITH DIFFERENTIAL/PLATELET
BASO%: 0.2 % (ref 0.0–2.0)
Basophils Absolute: 0 10*3/uL (ref 0.0–0.1)
EOS%: 0.9 % (ref 0.0–7.0)
Eosinophils Absolute: 0.1 10*3/uL (ref 0.0–0.5)
HEMATOCRIT: 30.6 % — AB (ref 38.4–49.9)
HEMOGLOBIN: 10.1 g/dL — AB (ref 13.0–17.1)
LYMPH#: 1.2 10*3/uL (ref 0.9–3.3)
LYMPH%: 12.4 % — ABNORMAL LOW (ref 14.0–49.0)
MCH: 26.6 pg — ABNORMAL LOW (ref 27.2–33.4)
MCHC: 33 g/dL (ref 32.0–36.0)
MCV: 80.7 fL (ref 79.3–98.0)
MONO#: 0.8 10*3/uL (ref 0.1–0.9)
MONO%: 8.4 % (ref 0.0–14.0)
NEUT#: 7.3 10*3/uL — ABNORMAL HIGH (ref 1.5–6.5)
NEUT%: 78.1 % — ABNORMAL HIGH (ref 39.0–75.0)
Platelets: 300 10*3/uL (ref 140–400)
RBC: 3.79 10*6/uL — ABNORMAL LOW (ref 4.20–5.82)
RDW: 20.6 % — AB (ref 11.0–14.6)
WBC: 9.3 10*3/uL (ref 4.0–10.3)

## 2016-05-04 LAB — COMPREHENSIVE METABOLIC PANEL
ALBUMIN: 2.9 g/dL — AB (ref 3.5–5.0)
ALK PHOS: 86 U/L (ref 40–150)
ALT: 9 U/L (ref 0–55)
AST: 11 U/L (ref 5–34)
Anion Gap: 9 mEq/L (ref 3–11)
BUN: 18.7 mg/dL (ref 7.0–26.0)
CALCIUM: 9.2 mg/dL (ref 8.4–10.4)
CHLORIDE: 97 meq/L — AB (ref 98–109)
CO2: 29 mEq/L (ref 22–29)
CREATININE: 0.8 mg/dL (ref 0.7–1.3)
EGFR: 89 mL/min/{1.73_m2} — ABNORMAL LOW (ref >90)
GLUCOSE: 100 mg/dL (ref 70–140)
POTASSIUM: 3.4 meq/L — AB (ref 3.5–5.1)
SODIUM: 136 meq/L (ref 136–145)
Total Bilirubin: 0.3 mg/dL (ref 0.20–1.20)
Total Protein: 6.3 g/dL — ABNORMAL LOW (ref 6.4–8.3)

## 2016-05-04 MED ORDER — DIPHENHYDRAMINE HCL 50 MG/ML IJ SOLN
25.0000 mg | Freq: Once | INTRAMUSCULAR | Status: AC
Start: 2016-05-04 — End: 2016-05-04
  Administered 2016-05-04: 25 mg via INTRAVENOUS

## 2016-05-04 MED ORDER — SODIUM CHLORIDE 0.9 % IV SOLN
Freq: Once | INTRAVENOUS | Status: AC
  Administered 2016-05-04: 10:00:00 via INTRAVENOUS

## 2016-05-04 MED ORDER — SODIUM CHLORIDE 0.9% FLUSH
10.0000 mL | INTRAVENOUS | Status: DC | PRN
  Administered 2016-05-04: 10 mL
  Filled 2016-05-04: qty 10

## 2016-05-04 MED ORDER — HEPARIN SOD (PORK) LOCK FLUSH 100 UNIT/ML IV SOLN
500.0000 [IU] | Freq: Once | INTRAVENOUS | Status: AC | PRN
  Administered 2016-05-04: 500 [IU]
  Filled 2016-05-04: qty 5

## 2016-05-04 MED ORDER — DIPHENHYDRAMINE HCL 50 MG/ML IJ SOLN
INTRAMUSCULAR | Status: AC
  Filled 2016-05-04: qty 1

## 2016-05-04 MED ORDER — HYDROCODONE-ACETAMINOPHEN 5-325 MG PO TABS: ORAL_TABLET | ORAL | 0 refills | Status: DC

## 2016-05-04 MED ORDER — PACLITAXEL CHEMO INJECTION 300 MG/50ML
80.0000 mg/m2 | Freq: Once | INTRAVENOUS | Status: AC
  Administered 2016-05-04: 132 mg via INTRAVENOUS
  Filled 2016-05-04: qty 22

## 2016-05-04 MED ORDER — SODIUM CHLORIDE 0.9% FLUSH
10.0000 mL | INTRAVENOUS | Status: DC | PRN
  Administered 2016-05-04: 10 mL via INTRAVENOUS
  Filled 2016-05-04: qty 10

## 2016-05-04 MED ORDER — SODIUM CHLORIDE 0.9 % IV SOLN
10.0000 mg | Freq: Once | INTRAVENOUS | Status: DC
  Filled 2016-05-04: qty 1

## 2016-05-04 MED ORDER — DEXAMETHASONE SODIUM PHOSPHATE 10 MG/ML IJ SOLN
INTRAMUSCULAR | Status: AC
  Filled 2016-05-04: qty 1

## 2016-05-04 MED ORDER — FAMOTIDINE IN NACL 20-0.9 MG/50ML-% IV SOLN
INTRAVENOUS | Status: AC
  Filled 2016-05-04: qty 50

## 2016-05-04 MED ORDER — DEXAMETHASONE SODIUM PHOSPHATE 10 MG/ML IJ SOLN
10.0000 mg | Freq: Once | INTRAMUSCULAR | Status: AC
  Administered 2016-05-04: 10 mg via INTRAVENOUS

## 2016-05-04 MED ORDER — FAMOTIDINE IN NACL 20-0.9 MG/50ML-% IV SOLN
20.0000 mg | Freq: Once | INTRAVENOUS | Status: AC
  Administered 2016-05-04: 20 mg via INTRAVENOUS

## 2016-05-04 NOTE — Progress Notes (Signed)
  [facility] OFFICE PROGRESS NOTE   Diagnosis: Esophagus cancer  INTERVAL HISTORY:   Mr. Welcher returns as scheduled. He is now taking prednisone daily. He reports partial improvement in anorexia. No difficulty swallowing. He continues to have pain at the left scapula. Tramadol does not relieve the pain. He began palliative radiation to the left scapula and left femur yesterday.  Objective:  Vital signs in last 24 hours:  Blood pressure (!) 91/54, pulse 85, temperature 98.5 F (36.9 C), temperature source Oral, resp. rate 18, height '5\' 8"'$  (1.727 m), weight 123 lb 12.8 oz (56.2 kg), SpO2 100 %.    HEENT: No thrush Resp: Distant breath sounds, scattered expiratory rhonchi, no respiratory distress Cardio: Regular rate and rhythm, distant heart sounds GI: No hepatomegaly, nontender Vascular: No leg edema   Portacath/PICC-without erythema  Lab Results:   Medications: I have reviewed the patient's current medications.  Assessment/Plan: 1. Adenocarcinoma the esophagus  upper endoscopy 02/17/2016 confirmed a distal esophagus mass  CT chest 01/30/2016-increased in size of bilateral lung nodules  CT abdomen/pelvis 02/23/2016 distal esophagus mass, gastrohepatic adenopathy, iliac and probable thoracic and lumbar metastases, 1.7 cm aortocaval node, nonspecific lung nodules  Biopsy of a left iliac lesion 03/03/2016 confirmed metastatic adenocarcinoma, TTF-1, cytokeratin 7, cytokeratin 20, and CDX-2positive; morphology similar to the distal esophagus biopsy  Cycle 1 FOLFOX 03/24/2016  Cycle 2 FOLFOX 04/06/2016  Restaging CTs 04/27/2016-slight interval progression of pulmonary metastatic disease; progressive osseous metastatic disease. Enlarging left femoral greater trochanter lesion could be at risk for pathologic fracture.  Weekly Taxol initiated 05/04/2016  2. 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 Prostate cancer treated with external beam radiation  4. COPD  5. Coronary artery disease  6. Peripheral vascular disease  7. Anemia  8. 03/18/2016 Port-A-Cath placement  9.  Palliative radiation to the left scapula and left proximal femur beginning to 05/03/2016   Disposition:  Mr. Mastel has experienced an overall improvement in his performance status over the past week. He is completing a course of palliative radiation to the left scapula and left femur. He continues to have pain despite tramadol. We added hydrocodone.  He would like to proceed with salvage chemotherapy. He will begin weekly Taxol today. We again reviewed the potential toxicities associated with Taxol.  He will return for chemotherapy on 05/12/2015. He is scheduled for an office visit 05/19/2015.  [name], MD  05/04/2016  9:50 AM

## 2016-05-04 NOTE — Progress Notes (Signed)
[name] has completed 2 fractions to his chest and left prox femur.  [name] reports having pain at a 1/10 in his left shoulder from the treatment table.  [name] reports having a dry cough.  When [name] does cough up anything, it is a cloudy liquid.  [name] is in a wheelchair today and said [name] does walk around the house.   BP 135/83 (BP Location: Right Arm, Patient Position: Sitting)   Pulse 83   Temp 98.2 F (36.8 C) (Oral)   Ht '5\' 8"'$  (1.727 m)   Wt 123 lb (55.8 kg)   SpO2 100%   BMI 18.70 kg/m    Wt Readings from Last 3 Encounters:  05/04/16 123 lb (55.8 kg)  05/04/16 123 lb 12.8 oz (56.2 kg)  04/27/16 120 lb 12.8 oz (54.8 kg)

## 2016-05-04 NOTE — Telephone Encounter (Signed)
Per 2/6 LOS and staff message I have scheduled appts. Notified the scheduler

## 2016-05-04 NOTE — Patient Instructions (Addendum)
[facility] Discharge Instructions for Patients Receiving Chemotherapy  Today you received the following chemotherapy agents : Taxol.  To help prevent nausea and vomiting after your treatment, we encourage you to take your nausea medication: Compazine. Take one every 6 hours as needed.  If you develop nausea and vomiting that is not controlled by your nausea medication, call the clinic.   BELOW ARE SYMPTOMS THAT SHOULD BE REPORTED IMMEDIATELY:  *FEVER GREATER THAN 100.5 F  *CHILLS WITH OR WITHOUT FEVER  NAUSEA AND VOMITING THAT IS NOT CONTROLLED WITH YOUR NAUSEA MEDICATION  *UNUSUAL SHORTNESS OF BREATH  *UNUSUAL BRUISING OR BLEEDING  TENDERNESS IN MOUTH AND THROAT WITH OR WITHOUT PRESENCE OF ULCERS  *URINARY PROBLEMS  *BOWEL PROBLEMS  UNUSUAL RASH Items with * indicate a potential emergency and should be followed up as soon as possible.  Feel free to call the clinic should you have any questions or concerns. The clinic phone number is [phone].  Please show the [facility] at check-in to the Emergency Department and triage nurse.  Paclitaxel injection What is this medicine? PACLITAXEL (PAK li TAX el) is a chemotherapy drug. It targets fast dividing cells, like cancer cells, and causes these cells to die. This medicine is used to treat ovarian cancer, breast cancer, and other cancers. This medicine may be used for other purposes; ask your health care provider or pharmacist if you have questions. COMMON BRAND NAME(S): Onxol, Taxol What should I tell my health care provider before I take this medicine? They need to know if you have any of these conditions: -blood disorders -irregular heartbeat -infection (especially a virus infection such as chickenpox, cold sores, or herpes) -liver disease -previous or ongoing radiation therapy -an unusual or allergic reaction to paclitaxel, alcohol, polyoxyethylated castor oil, other chemotherapy agents, other  medicines, foods, dyes, or preservatives -pregnant or trying to get pregnant -breast-feeding How should I use this medicine? This drug is given as an infusion into a vein. It is administered in a hospital or clinic by a specially trained health care professional. Talk to your pediatrician regarding the use of this medicine in children. Special care may be needed. Overdosage: If you think you have taken too much of this medicine contact a poison control center or emergency room at once. NOTE: This medicine is only for you. Do not share this medicine with others. What if I miss a dose? It is important not to miss your dose. Call your doctor or health care professional if you are unable to keep an appointment. What may interact with this medicine? Do not take this medicine with any of the following medications: -disulfiram -metronidazole This medicine may also interact with the following medications: -cyclosporine -diazepam -ketoconazole -medicines to increase blood counts like filgrastim, pegfilgrastim, sargramostim -other chemotherapy drugs like cisplatin, doxorubicin, epirubicin, etoposide, teniposide, vincristine -quinidine -testosterone -vaccines -verapamil Talk to your doctor or health care professional before taking any of these medicines: -acetaminophen -aspirin -ibuprofen -ketoprofen -naproxen This list may not describe all possible interactions. Give your health care provider a list of all the medicines, herbs, non-prescription drugs, or dietary supplements you use. Also tell them if you smoke, drink alcohol, or use illegal drugs. Some items may interact with your medicine. What should I watch for while using this medicine? Your condition will be monitored carefully while you are receiving this medicine. You will need important blood work done while you are taking this medicine. This medicine can cause serious allergic reactions. To reduce your  risk you will need to take other  medicine(s) before treatment with this medicine. If you experience allergic reactions like skin rash, itching or hives, swelling of the face, lips, or tongue, tell your doctor or health care professional right away. In some cases, you may be given additional medicines to help with side effects. Follow all directions for their use. This drug may make you feel generally unwell. This is not uncommon, as chemotherapy can affect healthy cells as well as cancer cells. Report any side effects. Continue your course of treatment even though you feel ill unless your doctor tells you to stop. Call your doctor or health care professional for advice if you get a fever, chills or sore throat, or other symptoms of a cold or flu. Do not treat yourself. This drug decreases your body's ability to fight infections. Try to avoid being around people who are sick. This medicine may increase your risk to bruise or bleed. Call your doctor or health care professional if you notice any unusual bleeding. Be careful brushing and flossing your teeth or using a toothpick because you may get an infection or bleed more easily. If you have any dental work done, tell your dentist you are receiving this medicine. Avoid taking products that contain aspirin, acetaminophen, ibuprofen, naproxen, or ketoprofen unless instructed by your doctor. These medicines may hide a fever. Do not become pregnant while taking this medicine. Women should inform their doctor if they wish to become pregnant or think they might be pregnant. There is a potential for serious side effects to an unborn child. Talk to your health care professional or pharmacist for more information. Do not breast-feed an infant while taking this medicine. Men are advised not to father a child while receiving this medicine. This product may contain alcohol. Ask your pharmacist or healthcare provider if this medicine contains alcohol. Be sure to tell all healthcare providers you are  taking this medicine. Certain medicines, like metronidazole and disulfiram, can cause an unpleasant reaction when taken with alcohol. The reaction includes flushing, headache, nausea, vomiting, sweating, and increased thirst. The reaction can last from 30 minutes to several hours. What side effects may I notice from receiving this medicine? Side effects that you should report to your doctor or health care professional as soon as possible: -allergic reactions like skin rash, itching or hives, swelling of the face, lips, or tongue -low blood counts - This drug may decrease the number of white blood cells, red blood cells and platelets. You may be at increased risk for infections and bleeding. -signs of infection - fever or chills, cough, sore throat, pain or difficulty passing urine -signs of decreased platelets or bleeding - bruising, pinpoint red spots on the skin, black, tarry stools, nosebleeds -signs of decreased red blood cells - unusually weak or tired, fainting spells, lightheadedness -breathing problems -chest pain -high or low blood pressure -mouth sores -nausea and vomiting -pain, swelling, redness or irritation at the injection site -pain, tingling, numbness in the hands or feet -slow or irregular heartbeat -swelling of the ankle, feet, hands Side effects that usually do not require medical attention (report to your doctor or health care professional if they continue or are bothersome): -bone pain -complete hair loss including hair on your head, underarms, pubic hair, eyebrows, and eyelashes -changes in the color of fingernails -diarrhea -loosening of the fingernails -loss of appetite -muscle or joint pain -red flush to skin -sweating This list may not describe all possible side effects. Call your  doctor for medical advice about side effects. You may report side effects to FDA at 1-800-FDA-1088. Where should I keep my medicine? This drug is given in a hospital or clinic and  will not be stored at home. NOTE: This sheet is a summary. It may not cover all possible information. If you have questions about this medicine, talk to your doctor, pharmacist, or health care provider.  2017 Elsevier/Gold Standard (2015-01-07 19:58:00)

## 2016-05-04 NOTE — Progress Notes (Signed)
  Radiation Oncology         [phone] ________________________________  Name: [name] MRN: [id number]  Date: 05/04/2016  DOB: [birth year]  Weekly Radiation Therapy Management    ICD-9-CM ICD-10-CM   1. Cancer of lower third of esophagus (HCC) 150.5 C15.5      Current Dose: 8 Gy     Planned Dose:  20 Gy  Narrative . . . . . . . . The patient presents for routine under treatment assessment.                                    Bishop Vanderwerf has completed 2 fractions to his chest and left proximal femur. He reports pain as a 1/10 in his left shoulder from the treatment table. He reports having a dry cough that is occasionally productive with cloudy sputum.                                  Set-up films were reviewed.                                 The chart was checked. Physical Findings. . .  height is '5\' 8"'$  (1.727 m) and weight is 123 lb (55.8 kg). His oral temperature is 98.2 F (36.8 C). His blood pressure is 135/83 and his pulse is 83. His oxygen saturation is 100%. . Weight essentially stable. Presents in a wheelchair. Lungs are clear to auscultation bilaterally. Heart has regular rate and rhythm. Impression . . . . . . . The patient is tolerating radiation. Plan . . . . . . . . . . . . Continue treatment as planned.  ________________________________   Blair Promise, PhD, MD  This document serves as a record of services personally performed by Gery Pray, MD. It was created on his behalf by Darcus Austin, a trained medical scribe. The creation of this record is based on the scribe's personal observations and the provider's statements to them. This document has been checked and approved by the attending provider.

## 2016-05-05 ENCOUNTER — Ambulatory Visit
Admission: RE | Admit: 2016-05-05 | Discharge: 2016-05-05 | Disposition: A | Discharge status: Home / Self Care | Payer: Medicare Other | Source: Ambulatory Visit | Attending: Radiation Oncology | Admitting: Radiation Oncology

## 2016-05-05 DIAGNOSIS — J449 Chronic obstructive pulmonary disease, unspecified: Secondary | ICD-10-CM | POA: Diagnosis not present

## 2016-05-05 DIAGNOSIS — Z880 Allergy status to penicillin: Secondary | ICD-10-CM | POA: Diagnosis not present

## 2016-05-05 DIAGNOSIS — Z882 Allergy status to sulfonamides status: Secondary | ICD-10-CM | POA: Diagnosis not present

## 2016-05-05 DIAGNOSIS — R1310 Dysphagia, unspecified: Secondary | ICD-10-CM | POA: Diagnosis not present

## 2016-05-05 DIAGNOSIS — C155 Malignant neoplasm of lower third of esophagus: Secondary | ICD-10-CM | POA: Diagnosis not present

## 2016-05-05 DIAGNOSIS — I10 Essential (primary) hypertension: Secondary | ICD-10-CM | POA: Diagnosis not present

## 2016-05-05 DIAGNOSIS — Z800 Family history of malignant neoplasm of digestive organs: Secondary | ICD-10-CM | POA: Diagnosis not present

## 2016-05-05 DIAGNOSIS — I252 Old myocardial infarction: Secondary | ICD-10-CM | POA: Diagnosis not present

## 2016-05-05 DIAGNOSIS — Z8673 Personal history of transient ischemic attack (TIA), and cerebral infarction without residual deficits: Secondary | ICD-10-CM | POA: Diagnosis not present

## 2016-05-05 DIAGNOSIS — I2510 Atherosclerotic heart disease of native coronary artery without angina pectoris: Secondary | ICD-10-CM | POA: Diagnosis not present

## 2016-05-05 DIAGNOSIS — E785 Hyperlipidemia, unspecified: Secondary | ICD-10-CM | POA: Diagnosis not present

## 2016-05-05 DIAGNOSIS — C3411 Malignant neoplasm of upper lobe, right bronchus or lung: Secondary | ICD-10-CM | POA: Diagnosis not present

## 2016-05-05 DIAGNOSIS — C7951 Secondary malignant neoplasm of bone: Secondary | ICD-10-CM | POA: Diagnosis not present

## 2016-05-05 DIAGNOSIS — Z823 Family history of stroke: Secondary | ICD-10-CM | POA: Diagnosis not present

## 2016-05-05 DIAGNOSIS — Z8249 Family history of ischemic heart disease and other diseases of the circulatory system: Secondary | ICD-10-CM | POA: Diagnosis not present

## 2016-05-05 DIAGNOSIS — I255 Ischemic cardiomyopathy: Secondary | ICD-10-CM | POA: Diagnosis not present

## 2016-05-05 DIAGNOSIS — K219 Gastro-esophageal reflux disease without esophagitis: Secondary | ICD-10-CM | POA: Diagnosis not present

## 2016-05-05 DIAGNOSIS — I739 Peripheral vascular disease, unspecified: Secondary | ICD-10-CM | POA: Diagnosis not present

## 2016-05-05 DIAGNOSIS — Z923 Personal history of irradiation: Secondary | ICD-10-CM | POA: Diagnosis not present

## 2016-05-05 DIAGNOSIS — I714 Abdominal aortic aneurysm, without rupture: Secondary | ICD-10-CM | POA: Diagnosis not present

## 2016-05-05 DIAGNOSIS — M1990 Unspecified osteoarthritis, unspecified site: Secondary | ICD-10-CM | POA: Diagnosis not present

## 2016-05-05 DIAGNOSIS — Z510 Encounter for antineoplastic radiation therapy: Secondary | ICD-10-CM | POA: Diagnosis not present

## 2016-05-06 ENCOUNTER — Ambulatory Visit
Admission: RE | Admit: 2016-05-06 | Discharge: 2016-05-06 | Disposition: A | Discharge status: Home / Self Care | Payer: Medicare Other | Source: Ambulatory Visit | Attending: Radiation Oncology | Admitting: Radiation Oncology

## 2016-05-06 DIAGNOSIS — C7951 Secondary malignant neoplasm of bone: Secondary | ICD-10-CM | POA: Diagnosis not present

## 2016-05-06 DIAGNOSIS — Z823 Family history of stroke: Secondary | ICD-10-CM | POA: Diagnosis not present

## 2016-05-06 DIAGNOSIS — Z882 Allergy status to sulfonamides status: Secondary | ICD-10-CM | POA: Diagnosis not present

## 2016-05-06 DIAGNOSIS — I739 Peripheral vascular disease, unspecified: Secondary | ICD-10-CM | POA: Diagnosis not present

## 2016-05-06 DIAGNOSIS — Z8673 Personal history of transient ischemic attack (TIA), and cerebral infarction without residual deficits: Secondary | ICD-10-CM | POA: Diagnosis not present

## 2016-05-06 DIAGNOSIS — E785 Hyperlipidemia, unspecified: Secondary | ICD-10-CM | POA: Diagnosis not present

## 2016-05-06 DIAGNOSIS — Z510 Encounter for antineoplastic radiation therapy: Secondary | ICD-10-CM | POA: Diagnosis not present

## 2016-05-06 DIAGNOSIS — I2510 Atherosclerotic heart disease of native coronary artery without angina pectoris: Secondary | ICD-10-CM | POA: Diagnosis not present

## 2016-05-06 DIAGNOSIS — I10 Essential (primary) hypertension: Secondary | ICD-10-CM | POA: Diagnosis not present

## 2016-05-06 DIAGNOSIS — K219 Gastro-esophageal reflux disease without esophagitis: Secondary | ICD-10-CM | POA: Diagnosis not present

## 2016-05-06 DIAGNOSIS — Z800 Family history of malignant neoplasm of digestive organs: Secondary | ICD-10-CM | POA: Diagnosis not present

## 2016-05-06 DIAGNOSIS — M1990 Unspecified osteoarthritis, unspecified site: Secondary | ICD-10-CM | POA: Diagnosis not present

## 2016-05-06 DIAGNOSIS — J449 Chronic obstructive pulmonary disease, unspecified: Secondary | ICD-10-CM | POA: Diagnosis not present

## 2016-05-06 DIAGNOSIS — I255 Ischemic cardiomyopathy: Secondary | ICD-10-CM | POA: Diagnosis not present

## 2016-05-06 DIAGNOSIS — I714 Abdominal aortic aneurysm, without rupture: Secondary | ICD-10-CM | POA: Diagnosis not present

## 2016-05-06 DIAGNOSIS — I252 Old myocardial infarction: Secondary | ICD-10-CM | POA: Diagnosis not present

## 2016-05-06 DIAGNOSIS — R1310 Dysphagia, unspecified: Secondary | ICD-10-CM | POA: Diagnosis not present

## 2016-05-06 DIAGNOSIS — Z923 Personal history of irradiation: Secondary | ICD-10-CM | POA: Diagnosis not present

## 2016-05-06 DIAGNOSIS — C155 Malignant neoplasm of lower third of esophagus: Secondary | ICD-10-CM | POA: Diagnosis not present

## 2016-05-06 DIAGNOSIS — C3411 Malignant neoplasm of upper lobe, right bronchus or lung: Secondary | ICD-10-CM | POA: Diagnosis not present

## 2016-05-06 DIAGNOSIS — Z8249 Family history of ischemic heart disease and other diseases of the circulatory system: Secondary | ICD-10-CM | POA: Diagnosis not present

## 2016-05-06 DIAGNOSIS — Z880 Allergy status to penicillin: Secondary | ICD-10-CM | POA: Diagnosis not present

## 2016-05-07 ENCOUNTER — Ambulatory Visit
Admission: RE | Admit: 2016-05-07 | Discharge: 2016-05-07 | Disposition: A | Discharge status: Home / Self Care | Payer: Medicare Other | Source: Ambulatory Visit | Attending: Radiation Oncology | Admitting: Radiation Oncology

## 2016-05-07 ENCOUNTER — Encounter: Payer: Self-pay | Admitting: Radiation Oncology

## 2016-05-07 DIAGNOSIS — J449 Chronic obstructive pulmonary disease, unspecified: Secondary | ICD-10-CM | POA: Diagnosis not present

## 2016-05-07 DIAGNOSIS — I10 Essential (primary) hypertension: Secondary | ICD-10-CM | POA: Diagnosis not present

## 2016-05-07 DIAGNOSIS — E785 Hyperlipidemia, unspecified: Secondary | ICD-10-CM | POA: Diagnosis not present

## 2016-05-07 DIAGNOSIS — Z923 Personal history of irradiation: Secondary | ICD-10-CM | POA: Diagnosis not present

## 2016-05-07 DIAGNOSIS — K219 Gastro-esophageal reflux disease without esophagitis: Secondary | ICD-10-CM | POA: Diagnosis not present

## 2016-05-07 DIAGNOSIS — R1310 Dysphagia, unspecified: Secondary | ICD-10-CM | POA: Diagnosis not present

## 2016-05-07 DIAGNOSIS — I739 Peripheral vascular disease, unspecified: Secondary | ICD-10-CM | POA: Diagnosis not present

## 2016-05-07 DIAGNOSIS — Z510 Encounter for antineoplastic radiation therapy: Secondary | ICD-10-CM | POA: Diagnosis not present

## 2016-05-07 DIAGNOSIS — I714 Abdominal aortic aneurysm, without rupture: Secondary | ICD-10-CM | POA: Diagnosis not present

## 2016-05-07 DIAGNOSIS — Z880 Allergy status to penicillin: Secondary | ICD-10-CM | POA: Diagnosis not present

## 2016-05-07 DIAGNOSIS — Z8249 Family history of ischemic heart disease and other diseases of the circulatory system: Secondary | ICD-10-CM | POA: Diagnosis not present

## 2016-05-07 DIAGNOSIS — I255 Ischemic cardiomyopathy: Secondary | ICD-10-CM | POA: Diagnosis not present

## 2016-05-07 DIAGNOSIS — M1990 Unspecified osteoarthritis, unspecified site: Secondary | ICD-10-CM | POA: Diagnosis not present

## 2016-05-07 DIAGNOSIS — C7951 Secondary malignant neoplasm of bone: Secondary | ICD-10-CM | POA: Diagnosis not present

## 2016-05-07 DIAGNOSIS — I2510 Atherosclerotic heart disease of native coronary artery without angina pectoris: Secondary | ICD-10-CM | POA: Diagnosis not present

## 2016-05-07 DIAGNOSIS — C155 Malignant neoplasm of lower third of esophagus: Secondary | ICD-10-CM | POA: Diagnosis not present

## 2016-05-07 DIAGNOSIS — Z882 Allergy status to sulfonamides status: Secondary | ICD-10-CM | POA: Diagnosis not present

## 2016-05-07 DIAGNOSIS — Z800 Family history of malignant neoplasm of digestive organs: Secondary | ICD-10-CM | POA: Diagnosis not present

## 2016-05-07 DIAGNOSIS — C3411 Malignant neoplasm of upper lobe, right bronchus or lung: Secondary | ICD-10-CM | POA: Diagnosis not present

## 2016-05-07 DIAGNOSIS — Z8673 Personal history of transient ischemic attack (TIA), and cerebral infarction without residual deficits: Secondary | ICD-10-CM | POA: Diagnosis not present

## 2016-05-07 DIAGNOSIS — I252 Old myocardial infarction: Secondary | ICD-10-CM | POA: Diagnosis not present

## 2016-05-07 DIAGNOSIS — Z823 Family history of stroke: Secondary | ICD-10-CM | POA: Diagnosis not present

## 2016-05-09 ENCOUNTER — Other Ambulatory Visit: Payer: Self-pay | Admitting: Oncology

## 2016-05-10 ENCOUNTER — Ambulatory Visit: Payer: Medicare Other | Admitting: Emergency Medicine

## 2016-05-11 ENCOUNTER — Ambulatory Visit: Payer: Medicare Other

## 2016-05-11 ENCOUNTER — Ambulatory Visit (HOSPITAL_BASED_OUTPATIENT_CLINIC_OR_DEPARTMENT_OTHER): Payer: Medicare Other

## 2016-05-11 ENCOUNTER — Other Ambulatory Visit: Payer: Self-pay | Admitting: Family Medicine

## 2016-05-11 ENCOUNTER — Other Ambulatory Visit (HOSPITAL_BASED_OUTPATIENT_CLINIC_OR_DEPARTMENT_OTHER): Payer: Medicare Other

## 2016-05-11 VITALS — BP 109/67 | HR 95 | Temp 97.7°F | Resp 16

## 2016-05-11 DIAGNOSIS — Z5111 Encounter for antineoplastic chemotherapy: Secondary | ICD-10-CM

## 2016-05-11 DIAGNOSIS — C155 Malignant neoplasm of lower third of esophagus: Secondary | ICD-10-CM

## 2016-05-11 DIAGNOSIS — Z95828 Presence of other vascular implants and grafts: Secondary | ICD-10-CM

## 2016-05-11 LAB — CBC WITH DIFFERENTIAL/PLATELET
BASO%: 0.4 % (ref 0.0–2.0)
Basophils Absolute: 0 10*3/uL (ref 0.0–0.1)
EOS ABS: 0.1 10*3/uL (ref 0.0–0.5)
EOS%: 0.9 % (ref 0.0–7.0)
HEMATOCRIT: 29.2 % — AB (ref 38.4–49.9)
HEMOGLOBIN: 9.6 g/dL — AB (ref 13.0–17.1)
LYMPH#: 0.5 10*3/uL — AB (ref 0.9–3.3)
LYMPH%: 8.2 % — AB (ref 14.0–49.0)
MCH: 26.5 pg — ABNORMAL LOW (ref 27.2–33.4)
MCHC: 32.9 g/dL (ref 32.0–36.0)
MCV: 80.7 fL (ref 79.3–98.0)
MONO#: 0.2 10*3/uL (ref 0.1–0.9)
MONO%: 3.5 % (ref 0.0–14.0)
NEUT%: 87 % — ABNORMAL HIGH (ref 39.0–75.0)
NEUTROS ABS: 4.9 10*3/uL (ref 1.5–6.5)
PLATELETS: 269 10*3/uL (ref 140–400)
RBC: 3.62 10*6/uL — AB (ref 4.20–5.82)
RDW: 21.5 % — AB (ref 11.0–14.6)
WBC: 5.6 10*3/uL (ref 4.0–10.3)

## 2016-05-11 LAB — COMPREHENSIVE METABOLIC PANEL
ALT: 8 U/L (ref 0–55)
AST: 12 U/L (ref 5–34)
Albumin: 3 g/dL — ABNORMAL LOW (ref 3.5–5.0)
Alkaline Phosphatase: 87 U/L (ref 40–150)
Anion Gap: 10 mEq/L (ref 3–11)
BUN: 20.6 mg/dL (ref 7.0–26.0)
CALCIUM: 9 mg/dL (ref 8.4–10.4)
CHLORIDE: 98 meq/L (ref 98–109)
CO2: 27 mEq/L (ref 22–29)
Creatinine: 0.7 mg/dL (ref 0.7–1.3)
GLUCOSE: 120 mg/dL (ref 70–140)
POTASSIUM: 3.9 meq/L (ref 3.5–5.1)
SODIUM: 135 meq/L — AB (ref 136–145)
Total Bilirubin: 0.51 mg/dL (ref 0.20–1.20)
Total Protein: 6.4 g/dL (ref 6.4–8.3)

## 2016-05-11 MED ORDER — FAMOTIDINE IN NACL 20-0.9 MG/50ML-% IV SOLN
INTRAVENOUS | Status: AC
  Filled 2016-05-11: qty 50

## 2016-05-11 MED ORDER — DEXAMETHASONE SODIUM PHOSPHATE 10 MG/ML IJ SOLN
INTRAMUSCULAR | Status: AC
  Filled 2016-05-11: qty 1

## 2016-05-11 MED ORDER — DIPHENHYDRAMINE HCL 50 MG/ML IJ SOLN
25.0000 mg | Freq: Once | INTRAMUSCULAR | Status: AC
  Administered 2016-05-11: 25 mg via INTRAVENOUS

## 2016-05-11 MED ORDER — HEPARIN SOD (PORK) LOCK FLUSH 100 UNIT/ML IV SOLN
500.0000 [IU] | Freq: Once | INTRAVENOUS | Status: AC | PRN
  Administered 2016-05-11: 500 [IU]
  Filled 2016-05-11: qty 5

## 2016-05-11 MED ORDER — PACLITAXEL CHEMO INJECTION 300 MG/50ML
80.0000 mg/m2 | Freq: Once | INTRAVENOUS | Status: AC
  Administered 2016-05-11: 132 mg via INTRAVENOUS
  Filled 2016-05-11: qty 22

## 2016-05-11 MED ORDER — SODIUM CHLORIDE 0.9% FLUSH
10.0000 mL | INTRAVENOUS | Status: DC | PRN
  Administered 2016-05-11: 10 mL
  Filled 2016-05-11: qty 10

## 2016-05-11 MED ORDER — SODIUM CHLORIDE 0.9% FLUSH
10.0000 mL | INTRAVENOUS | Status: DC | PRN
  Administered 2016-05-11: 10 mL via INTRAVENOUS
  Filled 2016-05-11: qty 10

## 2016-05-11 MED ORDER — SODIUM CHLORIDE 0.9 % IV SOLN
Freq: Once | INTRAVENOUS | Status: AC
  Administered 2016-05-11: 13:00:00 via INTRAVENOUS

## 2016-05-11 MED ORDER — DIPHENHYDRAMINE HCL 50 MG/ML IJ SOLN
INTRAMUSCULAR | Status: AC
  Filled 2016-05-11: qty 1

## 2016-05-11 MED ORDER — FAMOTIDINE IN NACL 20-0.9 MG/50ML-% IV SOLN
20.0000 mg | Freq: Once | INTRAVENOUS | Status: AC
  Administered 2016-05-11: 20 mg via INTRAVENOUS

## 2016-05-11 MED ORDER — DEXAMETHASONE SODIUM PHOSPHATE 10 MG/ML IJ SOLN
10.0000 mg | Freq: Once | INTRAMUSCULAR | Status: AC
  Administered 2016-05-11: 10 mg via INTRAVENOUS

## 2016-05-11 NOTE — Progress Notes (Signed)
Nutrition Follow-up:   Nutrition follow-up completed with patient this pm in infusion. Patient with metastatic esophageal cancer.   Weight on 2/13 was increased to 123 pounds from 117 pounds on 04/20/16.   Patient reports that he is trying to drink 3-4 ensure plus per day but does not really like the consistency of supplement.  Has tried adding ice cream and other things to ensure but still does not like it. Reports ate good on Friday soup for lunch, pinto beans, cornbread, sliced tomatoes for dinner but then little during the day on Saturday and even less on Sunday.  "I know I should be eating more but I am not going to force myself to eat."    Patient reports no problems chewing or swallowing and bowels moving on regular basis.    Medications: predinsone  Labs: reviewed   NUTRITION DIAGNOSIS:  Severe malnutrition continues   INTERVENTION:   Encouraged patient to continue to drink ensure plus daily to maintain weight. Offered suggestions for altering taste of ensure but patient not receptive.  Encouraged high calories and high protein foods.   Noted feeding tube has been discussed in the past with patient refusal of tube.    MONITORING, EVALUATION, GOAL: Patient will work to increase oral nutrition supplements and intake to minimize further weight loss   NEXT VISIT: as needed  Lonald Troiani B. Zenia Resides, [place], [place] (pager)

## 2016-05-12 NOTE — Progress Notes (Signed)
  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 The patient complained of left shoulder pain from the treatment table and a dry cough with occasional cloudy sputum. He did not have any other complaints at the time.  Plan: The patient has completed radiation treatment. The patient will return to radiation oncology clinic for routine followup in one month. I advised them to call or return sooner if they have any questions or concerns related to their recovery or treatment.  -----------------------------------  Blair Promise, PhD, MD  This document serves as a record of services personally performed by Gery Pray, MD. It was created on his behalf by Darcus Austin, a trained medical scribe. The creation of this record is based on the scribe's personal observations and the provider's statements to them. This document has been checked and approved by the attending provider.

## 2016-05-16 ENCOUNTER — Other Ambulatory Visit: Payer: Self-pay | Admitting: Oncology

## 2016-05-18 ENCOUNTER — Ambulatory Visit: Payer: Medicare Other | Admitting: Nurse Practitioner

## 2016-05-18 ENCOUNTER — Ambulatory Visit (HOSPITAL_BASED_OUTPATIENT_CLINIC_OR_DEPARTMENT_OTHER): Payer: Medicare Other

## 2016-05-18 ENCOUNTER — Telehealth: Payer: Self-pay | Admitting: Oncology

## 2016-05-18 ENCOUNTER — Ambulatory Visit (HOSPITAL_BASED_OUTPATIENT_CLINIC_OR_DEPARTMENT_OTHER): Payer: Medicare Other | Admitting: Nurse Practitioner

## 2016-05-18 ENCOUNTER — Telehealth: Payer: Self-pay

## 2016-05-18 ENCOUNTER — Other Ambulatory Visit: Payer: Self-pay

## 2016-05-18 ENCOUNTER — Other Ambulatory Visit (HOSPITAL_BASED_OUTPATIENT_CLINIC_OR_DEPARTMENT_OTHER): Payer: Medicare Other

## 2016-05-18 VITALS — BP 121/63 | HR 80 | Temp 97.8°F | Resp 19 | Ht 68.0 in | Wt 128.4 lb

## 2016-05-18 VITALS — BP 121/63 | HR 80 | Temp 97.8°F | Resp 19 | Wt 128.4 lb

## 2016-05-18 DIAGNOSIS — Z5111 Encounter for antineoplastic chemotherapy: Secondary | ICD-10-CM | POA: Diagnosis not present

## 2016-05-18 DIAGNOSIS — Z8546 Personal history of malignant neoplasm of prostate: Secondary | ICD-10-CM | POA: Diagnosis not present

## 2016-05-18 DIAGNOSIS — C155 Malignant neoplasm of lower third of esophagus: Secondary | ICD-10-CM

## 2016-05-18 DIAGNOSIS — C7951 Secondary malignant neoplasm of bone: Secondary | ICD-10-CM | POA: Diagnosis not present

## 2016-05-18 DIAGNOSIS — Z85118 Personal history of other malignant neoplasm of bronchus and lung: Secondary | ICD-10-CM

## 2016-05-18 DIAGNOSIS — C7800 Secondary malignant neoplasm of unspecified lung: Secondary | ICD-10-CM

## 2016-05-18 DIAGNOSIS — Z95828 Presence of other vascular implants and grafts: Secondary | ICD-10-CM

## 2016-05-18 LAB — CBC WITH DIFFERENTIAL/PLATELET
BASO%: 0.4 % (ref 0.0–2.0)
Basophils Absolute: 0 10*3/uL (ref 0.0–0.1)
EOS ABS: 0.1 10*3/uL (ref 0.0–0.5)
EOS%: 2 % (ref 0.0–7.0)
HCT: 26.6 % — ABNORMAL LOW (ref 38.4–49.9)
HEMOGLOBIN: 8.6 g/dL — AB (ref 13.0–17.1)
LYMPH%: 18 % (ref 14.0–49.0)
MCH: 26.4 pg — ABNORMAL LOW (ref 27.2–33.4)
MCHC: 32.3 g/dL (ref 32.0–36.0)
MCV: 81.6 fL (ref 79.3–98.0)
MONO#: 0.3 10*3/uL (ref 0.1–0.9)
MONO%: 7.3 % (ref 0.0–14.0)
NEUT%: 72.3 % (ref 39.0–75.0)
NEUTROS ABS: 3.3 10*3/uL (ref 1.5–6.5)
Platelets: 279 10*3/uL (ref 140–400)
RBC: 3.26 10*6/uL — AB (ref 4.20–5.82)
RDW: 23.3 % — AB (ref 11.0–14.6)
WBC: 4.5 10*3/uL (ref 4.0–10.3)
lymph#: 0.8 10*3/uL — ABNORMAL LOW (ref 0.9–3.3)

## 2016-05-18 LAB — COMPREHENSIVE METABOLIC PANEL
ALT: 10 U/L (ref 0–55)
AST: 10 U/L (ref 5–34)
Albumin: 3 g/dL — ABNORMAL LOW (ref 3.5–5.0)
Alkaline Phosphatase: 81 U/L (ref 40–150)
Anion Gap: 8 mEq/L (ref 3–11)
BUN: 18.3 mg/dL (ref 7.0–26.0)
CO2: 27 meq/L (ref 22–29)
Calcium: 8.8 mg/dL (ref 8.4–10.4)
Chloride: 103 mEq/L (ref 98–109)
Creatinine: 0.7 mg/dL (ref 0.7–1.3)
GLUCOSE: 102 mg/dL (ref 70–140)
POTASSIUM: 3.3 meq/L — AB (ref 3.5–5.1)
SODIUM: 138 meq/L (ref 136–145)
Total Protein: 6 g/dL — ABNORMAL LOW (ref 6.4–8.3)

## 2016-05-18 MED ORDER — SODIUM CHLORIDE 0.9 % IV SOLN
Freq: Once | INTRAVENOUS | Status: AC
  Administered 2016-05-18: 11:00:00 via INTRAVENOUS

## 2016-05-18 MED ORDER — SODIUM CHLORIDE 0.9% FLUSH
10.0000 mL | INTRAVENOUS | Status: DC | PRN
  Administered 2016-05-18: 10 mL
  Filled 2016-05-18: qty 10

## 2016-05-18 MED ORDER — PACLITAXEL CHEMO INJECTION 300 MG/50ML
80.0000 mg/m2 | Freq: Once | INTRAVENOUS | Status: AC
  Administered 2016-05-18: 132 mg via INTRAVENOUS
  Filled 2016-05-18: qty 22

## 2016-05-18 MED ORDER — SODIUM CHLORIDE 0.9% FLUSH
10.0000 mL | INTRAVENOUS | Status: DC | PRN
  Administered 2016-05-18: 10 mL via INTRAVENOUS
  Filled 2016-05-18: qty 10

## 2016-05-18 MED ORDER — FAMOTIDINE IN NACL 20-0.9 MG/50ML-% IV SOLN
INTRAVENOUS | Status: AC
  Filled 2016-05-18: qty 50

## 2016-05-18 MED ORDER — FAMOTIDINE IN NACL 20-0.9 MG/50ML-% IV SOLN
20.0000 mg | Freq: Once | INTRAVENOUS | Status: AC
  Administered 2016-05-18: 20 mg via INTRAVENOUS

## 2016-05-18 MED ORDER — ALPRAZOLAM 0.5 MG PO TABS: 0.2500 mg | ORAL_TABLET | Freq: Every day | ORAL | 0 refills | Status: DC | PRN

## 2016-05-18 MED ORDER — DIPHENHYDRAMINE HCL 50 MG/ML IJ SOLN
25.0000 mg | Freq: Once | INTRAMUSCULAR | Status: AC
  Administered 2016-05-18: 25 mg via INTRAVENOUS

## 2016-05-18 MED ORDER — HEPARIN SOD (PORK) LOCK FLUSH 100 UNIT/ML IV SOLN
500.0000 [IU] | Freq: Once | INTRAVENOUS | Status: AC | PRN
  Administered 2016-05-18: 500 [IU]
  Filled 2016-05-18: qty 5

## 2016-05-18 MED ORDER — DEXAMETHASONE SODIUM PHOSPHATE 10 MG/ML IJ SOLN
INTRAMUSCULAR | Status: AC
  Filled 2016-05-18: qty 1

## 2016-05-18 MED ORDER — DEXAMETHASONE SODIUM PHOSPHATE 10 MG/ML IJ SOLN
10.0000 mg | Freq: Once | INTRAMUSCULAR | Status: AC
  Administered 2016-05-18: 10 mg via INTRAVENOUS

## 2016-05-18 MED ORDER — DIPHENHYDRAMINE HCL 50 MG/ML IJ SOLN
INTRAMUSCULAR | Status: AC
  Filled 2016-05-18: qty 1

## 2016-05-18 NOTE — Telephone Encounter (Signed)
-----   Message from Owens Shark, NP sent at 05/18/2016  9:56 AM EST ----- Please let him know potassium is mildly low and confirm if he is currently taking potassium.

## 2016-05-18 NOTE — Progress Notes (Addendum)
  [facility] OFFICE PROGRESS NOTE   Diagnosis:  Esophagus cancer  INTERVAL HISTORY:   Mr. Coombs returns as scheduled. He completed the second weekly Taxol on 05/11/2016. He denies nausea/vomiting. No mouth sores. No diarrhea. He has numbness involving the right pinky finger, predated chemotherapy. No pain with swallowing. Appetite is better. He is gaining weight. Left shoulder pain is better.  Objective:  Vital signs in last 24 hours:  Blood pressure 121/63, pulse 80, temperature 97.8 F (36.6 C), temperature source Oral, resp. rate 19, weight 128 lb 6.4 oz (58.2 kg), SpO2 100 %.    HEENT: No thrush or ulcers. Resp: Distant breath sounds. Cardio: Regular rate and rhythm. Distant heart sounds. GI: Abdomen soft and nontender. No hepatomegaly. Vascular: No leg edema. Port-A-Cath without erythema.  Lab Results:  Lab Results  Component Value Date   WBC 4.5 05/18/2016   HGB 8.6 (L) 05/18/2016   HCT 26.6 (L) 05/18/2016   MCV 81.6 05/18/2016   PLT 279 05/18/2016   NEUTROABS 3.3 05/18/2016    Imaging:  No results found.  Medications: I have reviewed the patient's current medications.  Assessment/Plan: 1. Adenocarcinoma the esophagus  upper endoscopy 02/17/2016 confirmed a distal esophagus mass  CT chest 01/30/2016-increased in size of bilateral lung nodules  CT abdomen/pelvis 02/23/2016 distal esophagus mass, gastrohepatic adenopathy, iliac and probable thoracic and lumbar metastases, 1.7 cm aortocaval node, nonspecific lung nodules  Biopsy of a left iliac lesion 03/03/2016 confirmed metastatic adenocarcinoma, TTF-1, cytokeratin 7, cytokeratin 20, and CDX-2positive; morphology similar to the distal esophagus biopsy  Cycle 1 FOLFOX 03/24/2016  Cycle 2 FOLFOX 04/06/2016  Restaging CTs 04/27/2016-slight interval progression of pulmonary metastatic disease; progressive osseous metastatic disease. Enlarging left femoral greater trochanter lesion could be at  risk for pathologic fracture.  Weekly Taxol initiated 05/04/2016  2. 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 Prostate cancer treated with external beam radiation  4. COPD  5. Coronary artery disease  6. Peripheral vascular disease  7. Anemia  8. 03/18/2016 Port-A-Cath placement  9.  Palliative radiation to the left scapula and left proximal femur 05/03/2016-05/07/2016   Disposition: Mr. Zurcher appears stable. Plan to proceed with cycle 1 week 3 Taxol today as scheduled. He will return for a follow-up visit and cycle 2 Taxol (3 weeks on/1 week off) 06/01/2016. He will contact the office in the interim with any problems.  Patient seen with Dr. Benay Spice.    Ned Card ANP/GNP-BC   05/18/2016  9:26 AM   This was a shared visit with Ned Card. Mr. Sopp has an improved performance status. The plan is to continue Taxol chemotherapy.  Julieanne Manson, M.D.

## 2016-05-18 NOTE — Telephone Encounter (Signed)
Myrtle, RN spoke with pt in chemo room. Pt states she is taking potassium 3 times a week,Myrtle advised pt to start taking potassium daily again. Pt verbalized understanding

## 2016-05-18 NOTE — Telephone Encounter (Signed)
xanax rx phone in to [facility] [place]

## 2016-05-18 NOTE — Patient Instructions (Signed)
[facility] Discharge Instructions for Patients Receiving Chemotherapy  Today you received the following chemotherapy agents Taxol  To help prevent nausea and vomiting after your treatment, we encourage you to take your nausea medication   If you develop nausea and vomiting that is not controlled by your nausea medication, call the clinic.   BELOW ARE SYMPTOMS THAT SHOULD BE REPORTED IMMEDIATELY:  *FEVER GREATER THAN 100.5 F  *CHILLS WITH OR WITHOUT FEVER  NAUSEA AND VOMITING THAT IS NOT CONTROLLED WITH YOUR NAUSEA MEDICATION  *UNUSUAL SHORTNESS OF BREATH  *UNUSUAL BRUISING OR BLEEDING  TENDERNESS IN MOUTH AND THROAT WITH OR WITHOUT PRESENCE OF ULCERS  *URINARY PROBLEMS  *BOWEL PROBLEMS  UNUSUAL RASH Items with * indicate a potential emergency and should be followed up as soon as possible.  Feel free to call the clinic you have any questions or concerns. The clinic phone number is [phone].  Please show the CHEMO ALERT CARD at check-in to the Emergency Department and triage nurse.   

## 2016-05-18 NOTE — Telephone Encounter (Signed)
Appointments complete per 2/27 los. Patient to get print out infusion area.

## 2016-05-18 NOTE — Patient Instructions (Signed)
Implanted Port Home Guide An implanted port is a type of central line that is placed under the skin. Central lines are used to provide IV access when treatment or nutrition needs to be given through a person's veins. Implanted ports are used for long-term IV access. An implanted port may be placed because:  You need IV medicine that would be irritating to the small veins in your hands or arms.  You need long-term IV medicines, such as antibiotics.  You need IV nutrition for a long period.  You need frequent blood draws for lab tests.  You need dialysis.  Implanted ports are usually placed in the chest area, but they can also be placed in the upper arm, the abdomen, or the leg. An implanted port has two main parts:  Reservoir. The reservoir is round and will appear as a small, raised area under your skin. The reservoir is the part where a needle is inserted to give medicines or draw blood.  Catheter. The catheter is a thin, flexible tube that extends from the reservoir. The catheter is placed into a large vein. Medicine that is inserted into the reservoir goes into the catheter and then into the vein.  How will I care for my incision site? Do not get the incision site wet. Bathe or shower as directed by your health care provider. How is my port accessed? Special steps must be taken to access the port:  Before the port is accessed, a numbing cream can be placed on the skin. This helps numb the skin over the port site.  Your health care provider uses a sterile technique to access the port. ? Your health care provider must put on a mask and sterile gloves. ? The skin over your port is cleaned carefully with an antiseptic and allowed to dry. ? The port is gently pinched between sterile gloves, and a needle is inserted into the port.  Only "non-coring" port needles should be used to access the port. Once the port is accessed, a blood return should be checked. This helps ensure that the port  is in the vein and is not clogged.  If your port needs to remain accessed for a constant infusion, a clear (transparent) bandage will be placed over the needle site. The bandage and needle will need to be changed every week, or as directed by your health care provider.  Keep the bandage covering the needle clean and dry. Do not get it wet. Follow your health care provider's instructions on how to take a shower or bath while the port is accessed.  If your port does not need to stay accessed, no bandage is needed over the port.  What is flushing? Flushing helps keep the port from getting clogged. Follow your health care provider's instructions on how and when to flush the port. Ports are usually flushed with saline solution or a medicine called heparin. The need for flushing will depend on how the port is used.  If the port is used for intermittent medicines or blood draws, the port will need to be flushed: ? After medicines have been given. ? After blood has been drawn. ? As part of routine maintenance.  If a constant infusion is running, the port may not need to be flushed.  How long will my port stay implanted? The port can stay in for as long as your health care provider thinks it is needed. When it is time for the port to come out, surgery will be   done to remove it. The procedure is similar to the one performed when the port was put in. When should I seek immediate medical care? When you have an implanted port, you should seek immediate medical care if:  You notice a bad smell coming from the incision site.  You have swelling, redness, or drainage at the incision site.  You have more swelling or pain at the port site or the surrounding area.  You have a fever that is not controlled with medicine.  This information is not intended to replace advice given to you by your health care provider. Make sure you discuss any questions you have with your health care provider. Document  Released: 03/08/2005 Document Revised: 08/14/2015 Document Reviewed: 11/13/2012 Elsevier Interactive Patient Education  2017 [employer]vier Inc.  

## 2016-05-20 ENCOUNTER — Telehealth: Payer: Self-pay | Admitting: *Deleted

## 2016-05-20 ENCOUNTER — Other Ambulatory Visit: Payer: Self-pay | Admitting: *Deleted

## 2016-05-20 MED ORDER — PANTOPRAZOLE SODIUM 40 MG PO TBEC: 40.0000 mg | DELAYED_RELEASE_TABLET | Freq: Every day | ORAL | 3 refills | Status: DC

## 2016-05-20 NOTE — Telephone Encounter (Signed)
From: Ladene Artist, MD  Sent: 05/17/2016 10:24 AM  To: Marlon Pel, RN, Burnell Blanks, MD   Gerald Stabs,   I'm happy to switch him to Protonix 40 mg qam. Hopefully it will be covered by his insurance.   Norberto Sorenson   ----- Message -----  From: Burnell Blanks, MD  Sent: 05/17/2016 10:01 AM  To: Ladene Artist, MD   Norberto Sorenson, We are taking care of a mutual patient Mr. Nekhi Liwanag. You recently started him on Prilosec. We received a notification about the potential interaction with Plavix and reducing effectiveness of the Plavix. Would you be ok switching him to Protonix?   Darlina Guys    I spoke with pt and gave him above information.  Will send prescription [facility] [address] [facility] [place].  Pt reports he needs renewal for handicapped placard.  Dr. [name] has completed this in the past.  I told pt I would send him the completed form in the mail.

## 2016-05-21 ENCOUNTER — Other Ambulatory Visit: Payer: Self-pay | Admitting: *Deleted

## 2016-05-21 MED ORDER — PROCHLORPERAZINE MALEATE 5 MG PO TABS: 5.0000 mg | ORAL_TABLET | Freq: Four times a day (QID) | ORAL | 0 refills | Status: DC | PRN

## 2016-05-25 ENCOUNTER — Telehealth: Payer: Self-pay | Admitting: *Deleted

## 2016-05-25 ENCOUNTER — Telehealth: Payer: Self-pay | Admitting: Cardiovascular Disease

## 2016-05-25 NOTE — Telephone Encounter (Signed)
Resume omeprazole at prior dosage in 2-3 days.  Call if diarrhea does not promptly resolve.

## 2016-05-25 NOTE — Telephone Encounter (Signed)
"  This is [name] [phone]) calling about my dad.  His cardiologist and GI doctors prescribed a new medication '[id number]'.  He took this medicine Sunday and Monday morning.  Had severe diarrhea all day both days so I we've taken this out his medicine box and he won't be taking this anymore.  I'm watching him very closely.  If he has anymore stools I'll call Dr. Benay Spice back.  Going through the process of elimination.   It appears th is medicine is the cause.  He received chemotherapy 05-18-2016 and due for next treatment 06-01-2016."  [facility] needed to switch patient from Prilosec due to interaction with Plavix effectiveness is why GI changed to pantoprazole.  Today cardiology instructed patient to resume omeprazole in 2-3 days after following BRAT diet and FF.

## 2016-05-25 NOTE — Telephone Encounter (Signed)
Daughter notified and she will call back if diarrhea fails to improve

## 2016-05-25 NOTE — Telephone Encounter (Signed)
Spoke with patient's daughter, Magda Paganini ([place]), who states patient has had severe diarrhea since switching from Prilosec to Protonix . She states she did not give him the Protonix today and does not want patient to resume it. I advised that I will route message to Drs. [name] and Fuller Plan for their awareness. She states that if either of them want to discuss with her to please have them call. I encouraged her to follow the BRAT diet and push fluids to help prevent dehydration. She states she is going to feed him anything that he will eat. I advised I will forward message and she thanked me for the call.

## 2016-05-25 NOTE — Telephone Encounter (Signed)
New Message:   Pt was started on a new medicine(Pantoprazole) on Sunday for his stomach.Pt started having diarrhea on Sunday night Monday and Tuesday. Pt have stopping taking the medicine.Please call Leslie,his daughter,she wants to discuss this.

## 2016-05-26 NOTE — Telephone Encounter (Signed)
Patient and his daughter Magda Paganini notified of Dr. Lynne Leader and Dr. Gearldine Shown response.  They will call back for any questions or concerns.

## 2016-05-26 NOTE — Telephone Encounter (Signed)
"  I just had a liquid stool at 12:15.  I took two imodium.  I went all day yesterday without a bowel movement now liquid stool with lots of gas after the diarrhea stool.  Ate biscuit and gravy last night.  I drink half and half or regular homogenized milk.  I feel a little light headed.  No fever.  I have chills all the time because I'm cold blooded.  No N/V or shortness of breath."  Suggested BRAT diet or soft foods to rest bowels.  Avoiding greasy, fat, spicy or hot spicy foods.  Force fluids drinking water, Gatorade, broth to rest stomach.  Repeat one imodium in two hours. Call Cardiologist for any further instructions.  This nurse advised he drink 2% or 1% milk when able to drink milk.  Marland Kitchen

## 2016-05-26 NOTE — Telephone Encounter (Signed)
Please advise pt lactose free, caffeine free, low fat BRATT. Do not resume lactose products for 1 week.

## 2016-05-26 NOTE — Telephone Encounter (Signed)
Diarrhea could be due to chemotherapy Take imodium and if not better ,call us

## 2016-05-26 NOTE — Telephone Encounter (Signed)
I spoke with pt's daughter and gave her information from Dr. [name]

## 2016-05-26 NOTE — Telephone Encounter (Signed)
Pat, Can we let him know that omeprazole may keep his Plavix from working well, inhibiting his platelets but he has no recent coronary stents so the Plavix is really being used for prevention of thrombotic events. We do not recommend the use of omeprazole with Plavix but if the omeprazole works for him, he can choose to continue this medication.   Gerald Stabs

## 2016-05-30 ENCOUNTER — Other Ambulatory Visit: Payer: Self-pay | Admitting: Oncology

## 2016-06-01 ENCOUNTER — Other Ambulatory Visit (HOSPITAL_BASED_OUTPATIENT_CLINIC_OR_DEPARTMENT_OTHER): Payer: Medicare Other

## 2016-06-01 ENCOUNTER — Ambulatory Visit: Payer: Medicare Other

## 2016-06-01 ENCOUNTER — Other Ambulatory Visit: Payer: Self-pay | Admitting: Nurse Practitioner

## 2016-06-01 ENCOUNTER — Ambulatory Visit (HOSPITAL_BASED_OUTPATIENT_CLINIC_OR_DEPARTMENT_OTHER): Payer: Medicare Other

## 2016-06-01 ENCOUNTER — Ambulatory Visit (HOSPITAL_BASED_OUTPATIENT_CLINIC_OR_DEPARTMENT_OTHER): Payer: Medicare Other | Admitting: Oncology

## 2016-06-01 VITALS — BP 106/57 | HR 85 | Temp 97.5°F | Resp 18 | Ht 68.0 in | Wt 121.9 lb

## 2016-06-01 DIAGNOSIS — Z95828 Presence of other vascular implants and grafts: Secondary | ICD-10-CM

## 2016-06-01 DIAGNOSIS — E876 Hypokalemia: Secondary | ICD-10-CM | POA: Diagnosis not present

## 2016-06-01 DIAGNOSIS — D649 Anemia, unspecified: Secondary | ICD-10-CM

## 2016-06-01 DIAGNOSIS — C7951 Secondary malignant neoplasm of bone: Secondary | ICD-10-CM | POA: Diagnosis not present

## 2016-06-01 DIAGNOSIS — Z85118 Personal history of other malignant neoplasm of bronchus and lung: Secondary | ICD-10-CM | POA: Diagnosis not present

## 2016-06-01 DIAGNOSIS — R197 Diarrhea, unspecified: Secondary | ICD-10-CM

## 2016-06-01 DIAGNOSIS — Z8546 Personal history of malignant neoplasm of prostate: Secondary | ICD-10-CM

## 2016-06-01 DIAGNOSIS — Z5111 Encounter for antineoplastic chemotherapy: Secondary | ICD-10-CM

## 2016-06-01 DIAGNOSIS — C155 Malignant neoplasm of lower third of esophagus: Secondary | ICD-10-CM

## 2016-06-01 LAB — CBC WITH DIFFERENTIAL/PLATELET
BASO%: 0.3 % (ref 0.0–2.0)
Basophils Absolute: 0 10*3/uL (ref 0.0–0.1)
EOS ABS: 0.1 10*3/uL (ref 0.0–0.5)
EOS%: 0.7 % (ref 0.0–7.0)
HCT: 28.7 % — ABNORMAL LOW (ref 38.4–49.9)
HGB: 9.4 g/dL — ABNORMAL LOW (ref 13.0–17.1)
LYMPH%: 6.3 % — AB (ref 14.0–49.0)
MCH: 27.4 pg (ref 27.2–33.4)
MCHC: 32.8 g/dL (ref 32.0–36.0)
MCV: 83.7 fL (ref 79.3–98.0)
MONO#: 0.6 10*3/uL (ref 0.1–0.9)
MONO%: 5.6 % (ref 0.0–14.0)
NEUT%: 87.1 % — AB (ref 39.0–75.0)
NEUTROS ABS: 8.8 10*3/uL — AB (ref 1.5–6.5)
Platelets: 318 10*3/uL (ref 140–400)
RBC: 3.43 10*6/uL — AB (ref 4.20–5.82)
RDW: 24.8 % — ABNORMAL HIGH (ref 11.0–14.6)
WBC: 10.1 10*3/uL (ref 4.0–10.3)
lymph#: 0.6 10*3/uL — ABNORMAL LOW (ref 0.9–3.3)

## 2016-06-01 LAB — COMPREHENSIVE METABOLIC PANEL
ALT: 10 U/L (ref 0–55)
AST: 11 U/L (ref 5–34)
Albumin: 3.1 g/dL — ABNORMAL LOW (ref 3.5–5.0)
Alkaline Phosphatase: 93 U/L (ref 40–150)
Anion Gap: 10 mEq/L (ref 3–11)
BILIRUBIN TOTAL: 0.29 mg/dL (ref 0.20–1.20)
BUN: 15.1 mg/dL (ref 7.0–26.0)
CHLORIDE: 104 meq/L (ref 98–109)
CO2: 27 meq/L (ref 22–29)
Calcium: 9 mg/dL (ref 8.4–10.4)
Creatinine: 0.8 mg/dL (ref 0.7–1.3)
EGFR: 86 mL/min/{1.73_m2} — AB (ref >90)
GLUCOSE: 106 mg/dL (ref 70–140)
POTASSIUM: 2.5 meq/L — AB (ref 3.5–5.1)
SODIUM: 141 meq/L (ref 136–145)
TOTAL PROTEIN: 6.3 g/dL — AB (ref 6.4–8.3)

## 2016-06-01 MED ORDER — FAMOTIDINE IN NACL 20-0.9 MG/50ML-% IV SOLN
INTRAVENOUS | Status: AC
  Filled 2016-06-01: qty 50

## 2016-06-01 MED ORDER — DIPHENHYDRAMINE HCL 50 MG/ML IJ SOLN
25.0000 mg | Freq: Once | INTRAMUSCULAR | Status: AC
  Administered 2016-06-01: 25 mg via INTRAVENOUS

## 2016-06-01 MED ORDER — POTASSIUM CHLORIDE 20 MEQ/15ML (10%) PO SOLN: 20.0000 meq | Freq: Every day | ORAL | 0 refills | Status: DC

## 2016-06-01 MED ORDER — SODIUM CHLORIDE 0.9% FLUSH
10.0000 mL | INTRAVENOUS | Status: DC | PRN
  Administered 2016-06-01: 10 mL
  Filled 2016-06-01: qty 10

## 2016-06-01 MED ORDER — SODIUM CHLORIDE 0.9 % IV SOLN
Freq: Once | INTRAVENOUS | Status: AC
  Administered 2016-06-01: 12:00:00 via INTRAVENOUS

## 2016-06-01 MED ORDER — SODIUM CHLORIDE 0.9% FLUSH
10.0000 mL | INTRAVENOUS | Status: DC | PRN
  Administered 2016-06-01: 10 mL via INTRAVENOUS
  Filled 2016-06-01: qty 10

## 2016-06-01 MED ORDER — POTASSIUM CHLORIDE CRYS ER 20 MEQ PO TBCR
40.0000 meq | EXTENDED_RELEASE_TABLET | Freq: Once | ORAL | Status: DC
  Filled 2016-06-01: qty 2

## 2016-06-01 MED ORDER — DIPHENHYDRAMINE HCL 50 MG/ML IJ SOLN
INTRAMUSCULAR | Status: AC
  Filled 2016-06-01: qty 1

## 2016-06-01 MED ORDER — DIPHENOXYLATE-ATROPINE 2.5-0.025 MG PO TABS: 1.0000 | ORAL_TABLET | Freq: Four times a day (QID) | ORAL | 0 refills | Status: DC | PRN

## 2016-06-01 MED ORDER — FAMOTIDINE IN NACL 20-0.9 MG/50ML-% IV SOLN
20.0000 mg | Freq: Once | INTRAVENOUS | Status: AC
  Administered 2016-06-01: 20 mg via INTRAVENOUS

## 2016-06-01 MED ORDER — POTASSIUM CHLORIDE 20 MEQ/15ML (10%) PO SOLN
40.0000 meq | Freq: Once | ORAL | Status: AC
  Administered 2016-06-01: 40 meq via ORAL
  Filled 2016-06-01: qty 30

## 2016-06-01 MED ORDER — DEXAMETHASONE SODIUM PHOSPHATE 10 MG/ML IJ SOLN
10.0000 mg | Freq: Once | INTRAMUSCULAR | Status: AC
  Administered 2016-06-01: 10 mg via INTRAVENOUS

## 2016-06-01 MED ORDER — HEPARIN SOD (PORK) LOCK FLUSH 100 UNIT/ML IV SOLN
500.0000 [IU] | Freq: Once | INTRAVENOUS | Status: DC | PRN
  Filled 2016-06-01: qty 5

## 2016-06-01 MED ORDER — PACLITAXEL CHEMO INJECTION 300 MG/50ML
80.0000 mg/m2 | Freq: Once | INTRAVENOUS | Status: AC
  Administered 2016-06-01: 132 mg via INTRAVENOUS
  Filled 2016-06-01: qty 22

## 2016-06-01 MED ORDER — HEPARIN SOD (PORK) LOCK FLUSH 100 UNIT/ML IV SOLN
500.0000 [IU] | Freq: Once | INTRAVENOUS | Status: AC | PRN
  Administered 2016-06-01: 500 [IU]
  Filled 2016-06-01: qty 5

## 2016-06-01 MED ORDER — DEXAMETHASONE SODIUM PHOSPHATE 10 MG/ML IJ SOLN
INTRAMUSCULAR | Status: AC
  Filled 2016-06-01: qty 1

## 2016-06-01 NOTE — Patient Instructions (Signed)
[facility] Discharge Instructions for Patients Receiving Chemotherapy  Today you received the following chemotherapy agents Taxol   To help prevent nausea and vomiting after your treatment, we encourage you to take your nausea medication as directed.   If you develop nausea and vomiting that is not controlled by your nausea medication, call the clinic.   BELOW ARE SYMPTOMS THAT SHOULD BE REPORTED IMMEDIATELY:  *FEVER GREATER THAN 100.5 F  *CHILLS WITH OR WITHOUT FEVER  NAUSEA AND VOMITING THAT IS NOT CONTROLLED WITH YOUR NAUSEA MEDICATION  *UNUSUAL SHORTNESS OF BREATH  *UNUSUAL BRUISING OR BLEEDING  TENDERNESS IN MOUTH AND THROAT WITH OR WITHOUT PRESENCE OF ULCERS  *URINARY PROBLEMS  *BOWEL PROBLEMS  UNUSUAL RASH Items with * indicate a potential emergency and should be followed up as soon as possible.  Feel free to call the clinic you have any questions or concerns. The clinic phone number is [phone].  Please show the CHEMO ALERT CARD at check-in to the Emergency Department and triage nurse.   

## 2016-06-01 NOTE — Progress Notes (Signed)
[place] OFFICE PROGRESS NOTE   Diagnosis: Esophagus cancer  INTERVAL HISTORY:   Mr. Corbit returns as scheduled. The pain at the left upper back is much improved. He is no longer taking pain medication. He has completed 3 weeks of Taxol chemotherapy. No difficulty with swallowing. He has developed numbness and tingling in the feet. He has developed weakness of the right foot. His chief complaint is diarrhea for the past week. He has one to 3 loose stools per day. The diarrhea has not improved with Imodium.  Objective:  Vital signs in last 24 hours:  Blood pressure (!) 106/57, pulse 85, temperature 97.5 F (36.4 C), temperature source Oral, resp. rate 18, height '5\' 8"'$  (1.727 m), weight 121 lb 14.4 oz (55.3 kg), SpO2 99 %.    HEENT: No thrush or ulcers Resp: Scattered inspiratory/expiratory rhonchi and wheezes, no respiratory distress Cardio: Regular rate and rhythm GI: No hepatomegaly, nontender Vascular: Trace pitting edema at the left greater than right lower leg and ankle Neuro: Markedly diminished strength with dorsi flexion at the right foot, the leg strength is otherwise intact , sensation intact to light touch at the sole of the foot bilaterally  Portacath/PICC-without erythema  Lab Results:  Lab Results  Component Value Date   WBC 10.1 06/01/2016   HGB 9.4 (L) 06/01/2016   HCT 28.7 (L) 06/01/2016   MCV 83.7 06/01/2016   PLT 318 06/01/2016   NEUTROABS 8.8 (H) 06/01/2016   Potassium 2.5, creatinine 0.8, sodium 141, albumin 3.1  Medications: I have reviewed the patient's current medications.  Assessment/Plan: 1. Adenocarcinoma the esophagus  upper endoscopy 02/17/2016 confirmed a distal esophagus mass  CT chest 01/30/2016-increased in size of bilateral lung nodules  CT abdomen/pelvis 02/23/2016 distal esophagus mass, gastrohepatic adenopathy, iliac and probable thoracic and lumbar metastases, 1.7 cm aortocaval node, nonspecific lung  nodules  Biopsy of a left iliac lesion 03/03/2016 confirmed metastatic adenocarcinoma, TTF-1, cytokeratin 7, cytokeratin 20, and CDX-2positive; morphology similar to the distal esophagus biopsy  Cycle 1 FOLFOX 03/24/2016  Cycle 2 FOLFOX 04/06/2016  Restaging CTs 04/27/2016-slight interval progression of pulmonary metastatic disease; progressive osseous metastatic disease. Enlarging left femoral greater trochanter lesion could be at risk for pathologic fracture.  Weekly Taxol initiated 05/04/2016  2. 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 Prostate cancer treated with external beam radiation  4. COPD  5. Coronary artery disease  6. Peripheral vascular disease  7. Anemia  8. 03/18/2016 Port-A-Cath placement  9. Palliative radiation to the left scapula and left proximal femur 05/03/2016-05/07/2016  10. Diarrhea-etiology unclear  11.  Hypokalemia secondary to diarrhea  12.  Right footdrop-likely secondary to weight loss and peripheral nerve compression    Disposition:  Mr. Ciresi continues to have an improved performance status. He had no difficulty ambulating to the examination table today. The right footdrop is most likely secondary to peripheral nerve compression as opposed to chemotherapy-induced neuropathy or tumor involving the spine. He will try to not crosse his legs and limit pressure on the right leg.   We gave additional potassium today and he will increase the potassium supplement. He will try Lomotil for the diarrhea. I have a low clinical suspicion for C. difficile.  Mr. Kring will return for an office visit in 2 weeks.  25 minutes were spent with the patient today. The majority of the time was used for counseling and coordination of care.  [name], MD  06/01/2016  10:47 AM

## 2016-06-01 NOTE — Patient Instructions (Signed)
Implanted Port Home Guide An implanted port is a type of central line that is placed under the skin. Central lines are used to provide IV access when treatment or nutrition needs to be given through a person's veins. Implanted ports are used for long-term IV access. An implanted port may be placed because:  You need IV medicine that would be irritating to the small veins in your hands or arms.  You need long-term IV medicines, such as antibiotics.  You need IV nutrition for a long period.  You need frequent blood draws for lab tests.  You need dialysis.  Implanted ports are usually placed in the chest area, but they can also be placed in the upper arm, the abdomen, or the leg. An implanted port has two main parts:  Reservoir. The reservoir is round and will appear as a small, raised area under your skin. The reservoir is the part where a needle is inserted to give medicines or draw blood.  Catheter. The catheter is a thin, flexible tube that extends from the reservoir. The catheter is placed into a large vein. Medicine that is inserted into the reservoir goes into the catheter and then into the vein.  How will I care for my incision site? Do not get the incision site wet. Bathe or shower as directed by your health care provider. How is my port accessed? Special steps must be taken to access the port:  Before the port is accessed, a numbing cream can be placed on the skin. This helps numb the skin over the port site.  Your health care provider uses a sterile technique to access the port. ? Your health care provider must put on a mask and sterile gloves. ? The skin over your port is cleaned carefully with an antiseptic and allowed to dry. ? The port is gently pinched between sterile gloves, and a needle is inserted into the port.  Only "non-coring" port needles should be used to access the port. Once the port is accessed, a blood return should be checked. This helps ensure that the port  is in the vein and is not clogged.  If your port needs to remain accessed for a constant infusion, a clear (transparent) bandage will be placed over the needle site. The bandage and needle will need to be changed every week, or as directed by your health care provider.  Keep the bandage covering the needle clean and dry. Do not get it wet. Follow your health care provider's instructions on how to take a shower or bath while the port is accessed.  If your port does not need to stay accessed, no bandage is needed over the port.  What is flushing? Flushing helps keep the port from getting clogged. Follow your health care provider's instructions on how and when to flush the port. Ports are usually flushed with saline solution or a medicine called heparin. The need for flushing will depend on how the port is used.  If the port is used for intermittent medicines or blood draws, the port will need to be flushed: ? After medicines have been given. ? After blood has been drawn. ? As part of routine maintenance.  If a constant infusion is running, the port may not need to be flushed.  How long will my port stay implanted? The port can stay in for as long as your health care provider thinks it is needed. When it is time for the port to come out, surgery will be   done to remove it. The procedure is similar to the one performed when the port was put in. When should I seek immediate medical care? When you have an implanted port, you should seek immediate medical care if:  You notice a bad smell coming from the incision site.  You have swelling, redness, or drainage at the incision site.  You have more swelling or pain at the port site or the surrounding area.  You have a fever that is not controlled with medicine.  This information is not intended to replace advice given to you by your health care provider. Make sure you discuss any questions you have with your health care provider. Document  Released: 03/08/2005 Document Revised: 08/14/2015 Document Reviewed: 11/13/2012 Elsevier Interactive Patient Education  2017 [employer]vier Inc.  

## 2016-06-01 NOTE — Progress Notes (Signed)
Per Oren Beckmann, per MD Benay Spice, patient instructed to increase his potassium at home to 20 MEQ twice a day (pt had previously been taking 20 MEQ once a day). Patient and daughter verbalize understanding and agree with plan of care.

## 2016-06-02 ENCOUNTER — Other Ambulatory Visit: Payer: Self-pay | Admitting: Nurse Practitioner

## 2016-06-02 DIAGNOSIS — C155 Malignant neoplasm of lower third of esophagus: Secondary | ICD-10-CM

## 2016-06-04 ENCOUNTER — Other Ambulatory Visit: Payer: Self-pay | Admitting: *Deleted

## 2016-06-05 ENCOUNTER — Other Ambulatory Visit: Payer: Self-pay | Admitting: Cardiovascular Disease

## 2016-06-06 ENCOUNTER — Other Ambulatory Visit: Payer: Self-pay | Admitting: Oncology

## 2016-06-08 ENCOUNTER — Ambulatory Visit: Payer: Medicare Other

## 2016-06-08 ENCOUNTER — Other Ambulatory Visit (HOSPITAL_BASED_OUTPATIENT_CLINIC_OR_DEPARTMENT_OTHER): Payer: Medicare Other

## 2016-06-08 ENCOUNTER — Ambulatory Visit (HOSPITAL_BASED_OUTPATIENT_CLINIC_OR_DEPARTMENT_OTHER): Payer: Medicare Other

## 2016-06-08 VITALS — BP 123/73 | HR 81 | Temp 97.9°F | Resp 17

## 2016-06-08 DIAGNOSIS — Z5111 Encounter for antineoplastic chemotherapy: Secondary | ICD-10-CM | POA: Diagnosis not present

## 2016-06-08 DIAGNOSIS — Z95828 Presence of other vascular implants and grafts: Secondary | ICD-10-CM

## 2016-06-08 DIAGNOSIS — C155 Malignant neoplasm of lower third of esophagus: Secondary | ICD-10-CM

## 2016-06-08 LAB — CBC WITH DIFFERENTIAL/PLATELET
BASO%: 0.4 % (ref 0.0–2.0)
Basophils Absolute: 0 10*3/uL (ref 0.0–0.1)
EOS ABS: 0.1 10*3/uL (ref 0.0–0.5)
EOS%: 0.5 % (ref 0.0–7.0)
HCT: 27.2 % — ABNORMAL LOW (ref 38.4–49.9)
HGB: 9.1 g/dL — ABNORMAL LOW (ref 13.0–17.1)
LYMPH%: 5.2 % — AB (ref 14.0–49.0)
MCH: 29.1 pg (ref 27.2–33.4)
MCHC: 33.5 g/dL (ref 32.0–36.0)
MCV: 86.9 fL (ref 79.3–98.0)
MONO#: 0.3 10*3/uL (ref 0.1–0.9)
MONO%: 2.7 % (ref 0.0–14.0)
NEUT%: 91.2 % — AB (ref 39.0–75.0)
NEUTROS ABS: 8.6 10*3/uL — AB (ref 1.5–6.5)
PLATELETS: 367 10*3/uL (ref 140–400)
RBC: 3.13 10*6/uL — ABNORMAL LOW (ref 4.20–5.82)
RDW: 26.9 % — ABNORMAL HIGH (ref 11.0–14.6)
WBC: 9.5 10*3/uL (ref 4.0–10.3)
lymph#: 0.5 10*3/uL — ABNORMAL LOW (ref 0.9–3.3)

## 2016-06-08 LAB — COMPREHENSIVE METABOLIC PANEL
ALK PHOS: 88 U/L (ref 40–150)
ALT: 12 U/L (ref 0–55)
ANION GAP: 8 meq/L (ref 3–11)
AST: 13 U/L (ref 5–34)
Albumin: 3.1 g/dL — ABNORMAL LOW (ref 3.5–5.0)
BILIRUBIN TOTAL: 0.33 mg/dL (ref 0.20–1.20)
BUN: 21.7 mg/dL (ref 7.0–26.0)
CALCIUM: 8.9 mg/dL (ref 8.4–10.4)
CO2: 24 meq/L (ref 22–29)
Chloride: 109 mEq/L (ref 98–109)
Creatinine: 0.7 mg/dL (ref 0.7–1.3)
EGFR: 90 mL/min/{1.73_m2} — AB (ref >90)
Glucose: 112 mg/dl (ref 70–140)
Potassium: 3.6 mEq/L (ref 3.5–5.1)
Sodium: 141 mEq/L (ref 136–145)
TOTAL PROTEIN: 6.2 g/dL — AB (ref 6.4–8.3)

## 2016-06-08 MED ORDER — SODIUM CHLORIDE 0.9% FLUSH
10.0000 mL | INTRAVENOUS | Status: DC | PRN
  Administered 2016-06-08: 10 mL via INTRAVENOUS
  Filled 2016-06-08: qty 10

## 2016-06-08 MED ORDER — HEPARIN SOD (PORK) LOCK FLUSH 100 UNIT/ML IV SOLN
500.0000 [IU] | Freq: Once | INTRAVENOUS | Status: AC | PRN
  Administered 2016-06-08: 500 [IU]
  Filled 2016-06-08: qty 5

## 2016-06-08 MED ORDER — DEXAMETHASONE SODIUM PHOSPHATE 10 MG/ML IJ SOLN
INTRAMUSCULAR | Status: AC
  Filled 2016-06-08: qty 1

## 2016-06-08 MED ORDER — FAMOTIDINE IN NACL 20-0.9 MG/50ML-% IV SOLN
INTRAVENOUS | Status: AC
  Filled 2016-06-08: qty 50

## 2016-06-08 MED ORDER — FAMOTIDINE IN NACL 20-0.9 MG/50ML-% IV SOLN
20.0000 mg | Freq: Once | INTRAVENOUS | Status: AC
  Administered 2016-06-08: 20 mg via INTRAVENOUS

## 2016-06-08 MED ORDER — PACLITAXEL CHEMO INJECTION 300 MG/50ML
80.0000 mg/m2 | Freq: Once | INTRAVENOUS | Status: AC
  Administered 2016-06-08: 132 mg via INTRAVENOUS
  Filled 2016-06-08: qty 22

## 2016-06-08 MED ORDER — SODIUM CHLORIDE 0.9% FLUSH
10.0000 mL | INTRAVENOUS | Status: DC | PRN
  Administered 2016-06-08: 10 mL
  Filled 2016-06-08: qty 10

## 2016-06-08 MED ORDER — SODIUM CHLORIDE 0.9 % IV SOLN
Freq: Once | INTRAVENOUS | Status: AC
  Administered 2016-06-08: 14:00:00 via INTRAVENOUS

## 2016-06-08 MED ORDER — DIPHENHYDRAMINE HCL 50 MG/ML IJ SOLN
25.0000 mg | Freq: Once | INTRAMUSCULAR | Status: AC
  Administered 2016-06-08: 25 mg via INTRAVENOUS

## 2016-06-08 MED ORDER — DEXAMETHASONE SODIUM PHOSPHATE 10 MG/ML IJ SOLN
10.0000 mg | Freq: Once | INTRAMUSCULAR | Status: AC
  Administered 2016-06-08: 10 mg via INTRAVENOUS

## 2016-06-08 MED ORDER — DIPHENHYDRAMINE HCL 50 MG/ML IJ SOLN
INTRAMUSCULAR | Status: AC
  Filled 2016-06-08: qty 1

## 2016-06-08 NOTE — Patient Instructions (Signed)
[facility] Discharge Instructions for Patients Receiving Chemotherapy  Today you received the following chemotherapy agents Taxol   To help prevent nausea and vomiting after your treatment, we encourage you to take your nausea medication as directed.   If you develop nausea and vomiting that is not controlled by your nausea medication, call the clinic.   BELOW ARE SYMPTOMS THAT SHOULD BE REPORTED IMMEDIATELY:  *FEVER GREATER THAN 100.5 F  *CHILLS WITH OR WITHOUT FEVER  NAUSEA AND VOMITING THAT IS NOT CONTROLLED WITH YOUR NAUSEA MEDICATION  *UNUSUAL SHORTNESS OF BREATH  *UNUSUAL BRUISING OR BLEEDING  TENDERNESS IN MOUTH AND THROAT WITH OR WITHOUT PRESENCE OF ULCERS  *URINARY PROBLEMS  *BOWEL PROBLEMS  UNUSUAL RASH Items with * indicate a potential emergency and should be followed up as soon as possible.  Feel free to call the clinic you have any questions or concerns. The clinic phone number is [phone].  Please show the CHEMO ALERT CARD at check-in to the Emergency Department and triage nurse.   

## 2016-06-08 NOTE — Patient Instructions (Signed)
Implanted Port Home Guide An implanted port is a type of central line that is placed under the skin. Central lines are used to provide IV access when treatment or nutrition needs to be given through a person's veins. Implanted ports are used for long-term IV access. An implanted port may be placed because:  You need IV medicine that would be irritating to the small veins in your hands or arms.  You need long-term IV medicines, such as antibiotics.  You need IV nutrition for a long period.  You need frequent blood draws for lab tests.  You need dialysis.  Implanted ports are usually placed in the chest area, but they can also be placed in the upper arm, the abdomen, or the leg. An implanted port has two main parts:  Reservoir. The reservoir is round and will appear as a small, raised area under your skin. The reservoir is the part where a needle is inserted to give medicines or draw blood.  Catheter. The catheter is a thin, flexible tube that extends from the reservoir. The catheter is placed into a large vein. Medicine that is inserted into the reservoir goes into the catheter and then into the vein.  How will I care for my incision site? Do not get the incision site wet. Bathe or shower as directed by your health care provider. How is my port accessed? Special steps must be taken to access the port:  Before the port is accessed, a numbing cream can be placed on the skin. This helps numb the skin over the port site.  Your health care provider uses a sterile technique to access the port. ? Your health care provider must put on a mask and sterile gloves. ? The skin over your port is cleaned carefully with an antiseptic and allowed to dry. ? The port is gently pinched between sterile gloves, and a needle is inserted into the port.  Only "non-coring" port needles should be used to access the port. Once the port is accessed, a blood return should be checked. This helps ensure that the port  is in the vein and is not clogged.  If your port needs to remain accessed for a constant infusion, a clear (transparent) bandage will be placed over the needle site. The bandage and needle will need to be changed every week, or as directed by your health care provider.  Keep the bandage covering the needle clean and dry. Do not get it wet. Follow your health care provider's instructions on how to take a shower or bath while the port is accessed.  If your port does not need to stay accessed, no bandage is needed over the port.  What is flushing? Flushing helps keep the port from getting clogged. Follow your health care provider's instructions on how and when to flush the port. Ports are usually flushed with saline solution or a medicine called heparin. The need for flushing will depend on how the port is used.  If the port is used for intermittent medicines or blood draws, the port will need to be flushed: ? After medicines have been given. ? After blood has been drawn. ? As part of routine maintenance.  If a constant infusion is running, the port may not need to be flushed.  How long will my port stay implanted? The port can stay in for as long as your health care provider thinks it is needed. When it is time for the port to come out, surgery will be   done to remove it. The procedure is similar to the one performed when the port was put in. When should I seek immediate medical care? When you have an implanted port, you should seek immediate medical care if:  You notice a bad smell coming from the incision site.  You have swelling, redness, or drainage at the incision site.  You have more swelling or pain at the port site or the surrounding area.  You have a fever that is not controlled with medicine.  This information is not intended to replace advice given to you by your health care provider. Make sure you discuss any questions you have with your health care provider. Document  Released: 03/08/2005 Document Revised: 08/14/2015 Document Reviewed: 11/13/2012 Elsevier Interactive Patient Education  2017 [employer]vier Inc.  

## 2016-06-08 NOTE — Progress Notes (Signed)
Patient reported diarrhea since last night. Stated that he experiences it on and off and that the amount varies from large to a smear. Alerted MD and instructed to proceed with treatment and let patient know to take limotil more regularly.  Wylene Simmer, BSN, RN 06/08/2016 1:28 PM

## 2016-06-13 ENCOUNTER — Other Ambulatory Visit: Payer: Self-pay | Admitting: Oncology

## 2016-06-15 ENCOUNTER — Other Ambulatory Visit (HOSPITAL_BASED_OUTPATIENT_CLINIC_OR_DEPARTMENT_OTHER): Payer: Medicare Other

## 2016-06-15 ENCOUNTER — Ambulatory Visit: Payer: Medicare Other

## 2016-06-15 ENCOUNTER — Telehealth: Payer: Self-pay | Admitting: Oncology

## 2016-06-15 ENCOUNTER — Ambulatory Visit (HOSPITAL_BASED_OUTPATIENT_CLINIC_OR_DEPARTMENT_OTHER): Payer: Medicare Other

## 2016-06-15 ENCOUNTER — Ambulatory Visit (HOSPITAL_BASED_OUTPATIENT_CLINIC_OR_DEPARTMENT_OTHER): Payer: Medicare Other | Admitting: Nurse Practitioner

## 2016-06-15 VITALS — BP 97/60 | HR 84 | Temp 98.6°F | Resp 18 | Ht 68.0 in | Wt 120.6 lb

## 2016-06-15 DIAGNOSIS — C7951 Secondary malignant neoplasm of bone: Secondary | ICD-10-CM

## 2016-06-15 DIAGNOSIS — C7800 Secondary malignant neoplasm of unspecified lung: Secondary | ICD-10-CM

## 2016-06-15 DIAGNOSIS — C155 Malignant neoplasm of lower third of esophagus: Secondary | ICD-10-CM | POA: Diagnosis not present

## 2016-06-15 DIAGNOSIS — Z5111 Encounter for antineoplastic chemotherapy: Secondary | ICD-10-CM

## 2016-06-15 DIAGNOSIS — Z85118 Personal history of other malignant neoplasm of bronchus and lung: Secondary | ICD-10-CM | POA: Diagnosis not present

## 2016-06-15 DIAGNOSIS — D649 Anemia, unspecified: Secondary | ICD-10-CM | POA: Diagnosis not present

## 2016-06-15 DIAGNOSIS — E876 Hypokalemia: Secondary | ICD-10-CM | POA: Diagnosis not present

## 2016-06-15 DIAGNOSIS — R197 Diarrhea, unspecified: Secondary | ICD-10-CM

## 2016-06-15 DIAGNOSIS — Z8546 Personal history of malignant neoplasm of prostate: Secondary | ICD-10-CM

## 2016-06-15 DIAGNOSIS — Z95828 Presence of other vascular implants and grafts: Secondary | ICD-10-CM

## 2016-06-15 LAB — CBC WITH DIFFERENTIAL/PLATELET
BASO%: 0.4 % (ref 0.0–2.0)
Basophils Absolute: 0 10*3/uL (ref 0.0–0.1)
EOS%: 0.4 % (ref 0.0–7.0)
Eosinophils Absolute: 0 10*3/uL (ref 0.0–0.5)
HEMATOCRIT: 26.2 % — AB (ref 38.4–49.9)
HGB: 8.7 g/dL — ABNORMAL LOW (ref 13.0–17.1)
LYMPH#: 0.4 10*3/uL — AB (ref 0.9–3.3)
LYMPH%: 6 % — AB (ref 14.0–49.0)
MCH: 29.1 pg (ref 27.2–33.4)
MCHC: 33.2 g/dL (ref 32.0–36.0)
MCV: 87.8 fL (ref 79.3–98.0)
MONO#: 0.3 10*3/uL (ref 0.1–0.9)
MONO%: 5.7 % (ref 0.0–14.0)
NEUT%: 87.5 % — AB (ref 39.0–75.0)
NEUTROS ABS: 5.3 10*3/uL (ref 1.5–6.5)
Platelets: 380 10*3/uL (ref 140–400)
RBC: 2.98 10*6/uL — AB (ref 4.20–5.82)
RDW: 27 % — ABNORMAL HIGH (ref 11.0–14.6)
WBC: 6.1 10*3/uL (ref 4.0–10.3)

## 2016-06-15 LAB — COMPREHENSIVE METABOLIC PANEL
ALT: 11 U/L (ref 0–55)
AST: 11 U/L (ref 5–34)
Albumin: 3.1 g/dL — ABNORMAL LOW (ref 3.5–5.0)
Alkaline Phosphatase: 79 U/L (ref 40–150)
Anion Gap: 9 mEq/L (ref 3–11)
BUN: 17.7 mg/dL (ref 7.0–26.0)
CHLORIDE: 103 meq/L (ref 98–109)
CO2: 24 mEq/L (ref 22–29)
CREATININE: 0.8 mg/dL (ref 0.7–1.3)
Calcium: 8.8 mg/dL (ref 8.4–10.4)
EGFR: 89 mL/min/{1.73_m2} — ABNORMAL LOW (ref >90)
Glucose: 105 mg/dl (ref 70–140)
Potassium: 3.9 mEq/L (ref 3.5–5.1)
Sodium: 137 mEq/L (ref 136–145)
Total Protein: 6.1 g/dL — ABNORMAL LOW (ref 6.4–8.3)

## 2016-06-15 MED ORDER — SODIUM CHLORIDE 0.9 % IV SOLN
Freq: Once | INTRAVENOUS | Status: AC
  Administered 2016-06-15: 12:00:00 via INTRAVENOUS

## 2016-06-15 MED ORDER — PACLITAXEL CHEMO INJECTION 300 MG/50ML
80.0000 mg/m2 | Freq: Once | INTRAVENOUS | Status: AC
  Administered 2016-06-15: 132 mg via INTRAVENOUS
  Filled 2016-06-15: qty 22

## 2016-06-15 MED ORDER — DIPHENOXYLATE-ATROPINE 2.5-0.025 MG PO TABS: 1.0000 | ORAL_TABLET | Freq: Four times a day (QID) | ORAL | 0 refills | Status: DC | PRN

## 2016-06-15 MED ORDER — DEXAMETHASONE SODIUM PHOSPHATE 10 MG/ML IJ SOLN
10.0000 mg | Freq: Once | INTRAMUSCULAR | Status: AC
  Administered 2016-06-15: 10 mg via INTRAVENOUS

## 2016-06-15 MED ORDER — DIPHENHYDRAMINE HCL 50 MG/ML IJ SOLN
25.0000 mg | Freq: Once | INTRAMUSCULAR | Status: AC
  Administered 2016-06-15: 25 mg via INTRAVENOUS

## 2016-06-15 MED ORDER — DIPHENHYDRAMINE HCL 50 MG/ML IJ SOLN
INTRAMUSCULAR | Status: AC
  Filled 2016-06-15: qty 1

## 2016-06-15 MED ORDER — HEPARIN SOD (PORK) LOCK FLUSH 100 UNIT/ML IV SOLN
500.0000 [IU] | Freq: Once | INTRAVENOUS | Status: AC | PRN
  Administered 2016-06-15: 500 [IU]
  Filled 2016-06-15: qty 5

## 2016-06-15 MED ORDER — SODIUM CHLORIDE 0.9% FLUSH
10.0000 mL | INTRAVENOUS | Status: DC | PRN
  Administered 2016-06-15: 10 mL via INTRAVENOUS
  Filled 2016-06-15: qty 10

## 2016-06-15 MED ORDER — PREDNISONE 20 MG PO TABS: 20.0000 mg | ORAL_TABLET | Freq: Every day | ORAL | 2 refills | Status: DC

## 2016-06-15 MED ORDER — DEXAMETHASONE SODIUM PHOSPHATE 10 MG/ML IJ SOLN
INTRAMUSCULAR | Status: AC
  Filled 2016-06-15: qty 1

## 2016-06-15 MED ORDER — HEPARIN SOD (PORK) LOCK FLUSH 100 UNIT/ML IV SOLN
500.0000 [IU] | Freq: Once | INTRAVENOUS | Status: DC | PRN
  Filled 2016-06-15: qty 5

## 2016-06-15 MED ORDER — PROCHLORPERAZINE MALEATE 5 MG PO TABS: 5.0000 mg | ORAL_TABLET | Freq: Four times a day (QID) | ORAL | 0 refills | Status: DC | PRN

## 2016-06-15 MED ORDER — SODIUM CHLORIDE 0.9% FLUSH
10.0000 mL | INTRAVENOUS | Status: DC | PRN
  Administered 2016-06-15: 10 mL
  Filled 2016-06-15: qty 10

## 2016-06-15 MED ORDER — FAMOTIDINE IN NACL 20-0.9 MG/50ML-% IV SOLN
20.0000 mg | Freq: Once | INTRAVENOUS | Status: AC
  Administered 2016-06-15: 20 mg via INTRAVENOUS

## 2016-06-15 MED ORDER — FAMOTIDINE IN NACL 20-0.9 MG/50ML-% IV SOLN
INTRAVENOUS | Status: AC
  Filled 2016-06-15: qty 50

## 2016-06-15 NOTE — Patient Instructions (Signed)
Decrease potassium to once daily

## 2016-06-15 NOTE — Progress Notes (Signed)
Nutrition Follow-up:  Patient seen in infusion this am.  Patient with metastatic esophageal cancer.   Patient reports that appetite is a little bit better.  "I don't have trouble swallowing like I did before."   Reports drinking 1-2 ensure per day.  "I know I need to drink more but I can't." Reports had a [employer] hamburger last night for dinner.  Enjoys snacking on nuts, pudding, ice cream, yogurt, rice pudding.  Reports has lack of taste.     Medications: reviewed  Labs: reviewed  Anthropometrics:   Weight 120 lb 9.6 oz today decreased from 123 pounds on 2/13   NUTRITION DIAGNOSIS: Severe malnutrition continues     INTERVENTION:   "I know it is my fault, I need to eat more." Encouraged patient to eat every 2-3 hours high calories, high protein foods.  Patient able to provide examples of those foods Encouraged intake of ensure with at least 350 calories or more. Patient has refused feeding tube in the past.    MONITORING, EVALUATION, GOAL: Patient will work to increase oral nutrition supplements and intake to minimize further weight loss   NEXT VISIT: April 24 during infusion  Cannan Beeck B. Zenia Resides, [place], [place] Registered Dietitian [phone] (pager)

## 2016-06-15 NOTE — Patient Instructions (Signed)
[facility] Discharge Instructions for Patients Receiving Chemotherapy  Today you received the following chemotherapy agents Taxol   To help prevent nausea and vomiting after your treatment, we encourage you to take your nausea medication as directed.   If you develop nausea and vomiting that is not controlled by your nausea medication, call the clinic.   BELOW ARE SYMPTOMS THAT SHOULD BE REPORTED IMMEDIATELY:  *FEVER GREATER THAN 100.5 F  *CHILLS WITH OR WITHOUT FEVER  NAUSEA AND VOMITING THAT IS NOT CONTROLLED WITH YOUR NAUSEA MEDICATION  *UNUSUAL SHORTNESS OF BREATH  *UNUSUAL BRUISING OR BLEEDING  TENDERNESS IN MOUTH AND THROAT WITH OR WITHOUT PRESENCE OF ULCERS  *URINARY PROBLEMS  *BOWEL PROBLEMS  UNUSUAL RASH Items with * indicate a potential emergency and should be followed up as soon as possible.  Feel free to call the clinic you have any questions or concerns. The clinic phone number is [phone].  Please show the CHEMO ALERT CARD at check-in to the Emergency Department and triage nurse.   

## 2016-06-15 NOTE — Progress Notes (Signed)
  [place] OFFICE PROGRESS NOTE   Diagnosis:  Esophagus cancer  INTERVAL HISTORY:   [name] returns as scheduled. He completed cycle 2 day 8 weekly Taxol 06/08/2016. He denies nausea/vomiting. No mouth sores. He has intermittent loose stools. At the most 2 times a day. The loose stools do not occur on a daily basis. He takes Lomotil as needed. No dysphagia. He denies pain. Appetite remains poor. He has recently noted intermittent painless swelling in the right groin region. He wonders if he has a hernia. No numbness or tingling in the fingertips or toes. He had a recent episode of numbness lasting about 15 minutes involving the medial side of the right first finger.  Objective:  Vital signs in last 24 hours:  Blood pressure 97/60, pulse 84, temperature 98.6 F (37 C), temperature source Oral, resp. rate 18, height '5\' 8"'$  (1.727 m), weight 120 lb 9.6 oz (54.7 kg), SpO2 100 %.    HEENT: No thrush or ulcers. Resp: Distant breath sounds, scattered rhonchi. No respiratory distress. Cardio: Regular rate and rhythm. GI: No hepatomegaly. Vascular: No leg edema. Musculoskeletal: He appears to have a right inguinal hernia which is soft, reducible. Neuro: Right foot drop. Right leg strength otherwise intact. Port-A-Cath without erythema.   Lab Results:  Lab Results  Component Value Date   WBC 6.1 06/15/2016   HGB 8.7 (L) 06/15/2016   HCT 26.2 (L) 06/15/2016   MCV 87.8 06/15/2016   PLT 380 06/15/2016   NEUTROABS 5.3 06/15/2016    Imaging:  No results found.  Medications: I have reviewed the patient's current medications.  Assessment/Plan: 1. Adenocarcinoma the esophagus  upper endoscopy 02/17/2016 confirmed a distal esophagus mass  CT chest 01/30/2016-increased in size of bilateral lung nodules  CT abdomen/pelvis 02/23/2016 distal esophagus mass, gastrohepatic adenopathy, iliac and probable thoracic and lumbar metastases, 1.7 cm aortocaval node, nonspecific  lung nodules  Biopsy of a left iliac lesion 03/03/2016 confirmed metastatic adenocarcinoma, TTF-1, cytokeratin 7, cytokeratin 20, and CDX-2positive; morphology similar to the distal esophagus biopsy  Cycle 1 FOLFOX 03/24/2016  Cycle 2 FOLFOX 04/06/2016  Restaging CTs 04/27/2016-slight interval progression of pulmonary metastatic disease; progressive osseous metastatic disease. Enlarging left femoral greater trochanter lesion could be at risk for pathologic fracture.  Weekly Taxol initiated 05/04/2016  2. 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 Prostate cancer treated with external beam radiation  4. COPD  5. Coronary artery disease  6. Peripheral vascular disease  7. Anemia  8. 03/18/2016 Port-A-Cath placement  9. Palliative radiation to the left scapula and left proximal femur 05/03/2016-05/07/2016  10. Diarrhea-etiology unclear  11.  Hypokalemia secondary to diarrhea  12.  Right footdrop-likely secondary to weight loss and peripheral nerve compression    Disposition: [name] appears stable. Plan to proceed with cycle 2 week 3 Taxol today as scheduled. He is off of treatment next week. He will return to begin cycle 3 week 1 Taxol 06/29/2016. The plan is to obtain restaging CT scans after he has completed 3 cycles of weekly Taxol. We will see him in follow-up prior to treatment on 06/29/2016. He will contact the office in the interim with any problems.  Plan reviewed with Dr. Benay Spice.    Ned Card ANP/GNP-BC   06/15/2016  11:06 AM

## 2016-06-15 NOTE — Telephone Encounter (Signed)
Gave patient Step son AVS and calender per 06/15/2016. [facility] to contact patient with Ct schedule.

## 2016-06-17 ENCOUNTER — Other Ambulatory Visit: Payer: Self-pay | Admitting: *Deleted

## 2016-06-17 DIAGNOSIS — C155 Malignant neoplasm of lower third of esophagus: Secondary | ICD-10-CM

## 2016-06-26 ENCOUNTER — Other Ambulatory Visit: Payer: Self-pay | Admitting: Oncology

## 2016-06-27 ENCOUNTER — Other Ambulatory Visit: Payer: Self-pay | Admitting: Gastroenterology

## 2016-06-27 DIAGNOSIS — R933 Abnormal findings on diagnostic imaging of other parts of digestive tract: Secondary | ICD-10-CM

## 2016-06-27 DIAGNOSIS — R1319 Other dysphagia: Secondary | ICD-10-CM

## 2016-06-27 DIAGNOSIS — R1310 Dysphagia, unspecified: Secondary | ICD-10-CM

## 2016-06-29 ENCOUNTER — Other Ambulatory Visit (HOSPITAL_BASED_OUTPATIENT_CLINIC_OR_DEPARTMENT_OTHER): Payer: Medicare Other

## 2016-06-29 ENCOUNTER — Ambulatory Visit (HOSPITAL_BASED_OUTPATIENT_CLINIC_OR_DEPARTMENT_OTHER): Payer: Medicare Other

## 2016-06-29 ENCOUNTER — Ambulatory Visit: Payer: Medicare Other

## 2016-06-29 ENCOUNTER — Ambulatory Visit (HOSPITAL_BASED_OUTPATIENT_CLINIC_OR_DEPARTMENT_OTHER): Payer: Medicare Other | Admitting: Nurse Practitioner

## 2016-06-29 VITALS — BP 130/78 | HR 79

## 2016-06-29 VITALS — BP 96/49 | HR 90 | Temp 98.4°F | Resp 17 | Ht 68.0 in | Wt 120.0 lb

## 2016-06-29 DIAGNOSIS — R197 Diarrhea, unspecified: Secondary | ICD-10-CM

## 2016-06-29 DIAGNOSIS — D649 Anemia, unspecified: Secondary | ICD-10-CM

## 2016-06-29 DIAGNOSIS — C155 Malignant neoplasm of lower third of esophagus: Secondary | ICD-10-CM

## 2016-06-29 DIAGNOSIS — Z95828 Presence of other vascular implants and grafts: Secondary | ICD-10-CM

## 2016-06-29 DIAGNOSIS — C7951 Secondary malignant neoplasm of bone: Secondary | ICD-10-CM | POA: Diagnosis not present

## 2016-06-29 DIAGNOSIS — E876 Hypokalemia: Secondary | ICD-10-CM | POA: Diagnosis not present

## 2016-06-29 DIAGNOSIS — Z5111 Encounter for antineoplastic chemotherapy: Secondary | ICD-10-CM | POA: Diagnosis not present

## 2016-06-29 DIAGNOSIS — Z85118 Personal history of other malignant neoplasm of bronchus and lung: Secondary | ICD-10-CM | POA: Diagnosis not present

## 2016-06-29 LAB — CBC WITH DIFFERENTIAL/PLATELET
BASO%: 0.6 % (ref 0.0–2.0)
BASOS ABS: 0.1 10*3/uL (ref 0.0–0.1)
EOS ABS: 0.1 10*3/uL (ref 0.0–0.5)
EOS%: 1 % (ref 0.0–7.0)
HCT: 28.4 % — ABNORMAL LOW (ref 38.4–49.9)
HGB: 9.4 g/dL — ABNORMAL LOW (ref 13.0–17.1)
LYMPH%: 6.4 % — AB (ref 14.0–49.0)
MCH: 29.3 pg (ref 27.2–33.4)
MCHC: 33 g/dL (ref 32.0–36.0)
MCV: 88.7 fL (ref 79.3–98.0)
MONO#: 0.6 10*3/uL (ref 0.1–0.9)
MONO%: 6.7 % (ref 0.0–14.0)
NEUT#: 7.4 10*3/uL — ABNORMAL HIGH (ref 1.5–6.5)
NEUT%: 85.3 % — AB (ref 39.0–75.0)
Platelets: 366 10*3/uL (ref 140–400)
RBC: 3.2 10*6/uL — ABNORMAL LOW (ref 4.20–5.82)
RDW: 25.5 % — ABNORMAL HIGH (ref 11.0–14.6)
WBC: 8.7 10*3/uL (ref 4.0–10.3)
lymph#: 0.6 10*3/uL — ABNORMAL LOW (ref 0.9–3.3)

## 2016-06-29 LAB — COMPREHENSIVE METABOLIC PANEL
ALK PHOS: 81 U/L (ref 40–150)
ALT: 12 U/L (ref 0–55)
AST: 13 U/L (ref 5–34)
Albumin: 3.1 g/dL — ABNORMAL LOW (ref 3.5–5.0)
Anion Gap: 8 mEq/L (ref 3–11)
BUN: 17.9 mg/dL (ref 7.0–26.0)
CHLORIDE: 102 meq/L (ref 98–109)
CO2: 26 mEq/L (ref 22–29)
Calcium: 8.9 mg/dL (ref 8.4–10.4)
Creatinine: 0.7 mg/dL (ref 0.7–1.3)
EGFR: 90 mL/min/{1.73_m2} — AB (ref >90)
GLUCOSE: 96 mg/dL (ref 70–140)
POTASSIUM: 3.9 meq/L (ref 3.5–5.1)
SODIUM: 137 meq/L (ref 136–145)
Total Bilirubin: 0.3 mg/dL (ref 0.20–1.20)
Total Protein: 6.1 g/dL — ABNORMAL LOW (ref 6.4–8.3)

## 2016-06-29 MED ORDER — HEPARIN SOD (PORK) LOCK FLUSH 100 UNIT/ML IV SOLN
500.0000 [IU] | Freq: Once | INTRAVENOUS | Status: AC | PRN
  Administered 2016-06-29: 500 [IU]
  Filled 2016-06-29: qty 5

## 2016-06-29 MED ORDER — DEXAMETHASONE SODIUM PHOSPHATE 10 MG/ML IJ SOLN
INTRAMUSCULAR | Status: AC
  Filled 2016-06-29: qty 1

## 2016-06-29 MED ORDER — FAMOTIDINE IN NACL 20-0.9 MG/50ML-% IV SOLN
INTRAVENOUS | Status: AC
  Filled 2016-06-29: qty 50

## 2016-06-29 MED ORDER — SODIUM CHLORIDE 0.9% FLUSH
10.0000 mL | INTRAVENOUS | Status: DC | PRN
  Administered 2016-06-29: 10 mL
  Filled 2016-06-29: qty 10

## 2016-06-29 MED ORDER — PACLITAXEL CHEMO INJECTION 300 MG/50ML
80.0000 mg/m2 | Freq: Once | INTRAVENOUS | Status: AC
  Administered 2016-06-29: 132 mg via INTRAVENOUS
  Filled 2016-06-29: qty 22

## 2016-06-29 MED ORDER — SODIUM CHLORIDE 0.9% FLUSH
10.0000 mL | INTRAVENOUS | Status: DC | PRN
  Administered 2016-06-29: 10 mL via INTRAVENOUS
  Filled 2016-06-29: qty 10

## 2016-06-29 MED ORDER — DIPHENHYDRAMINE HCL 50 MG/ML IJ SOLN
25.0000 mg | Freq: Once | INTRAMUSCULAR | Status: AC
  Administered 2016-06-29: 25 mg via INTRAVENOUS

## 2016-06-29 MED ORDER — DEXAMETHASONE SODIUM PHOSPHATE 10 MG/ML IJ SOLN
10.0000 mg | Freq: Once | INTRAMUSCULAR | Status: AC
  Administered 2016-06-29: 10 mg via INTRAVENOUS

## 2016-06-29 MED ORDER — DIPHENHYDRAMINE HCL 50 MG/ML IJ SOLN
INTRAMUSCULAR | Status: AC
  Filled 2016-06-29: qty 1

## 2016-06-29 MED ORDER — SODIUM CHLORIDE 0.9 % IV SOLN
Freq: Once | INTRAVENOUS | Status: AC
  Administered 2016-06-29: 13:00:00 via INTRAVENOUS

## 2016-06-29 MED ORDER — FAMOTIDINE IN NACL 20-0.9 MG/50ML-% IV SOLN
20.0000 mg | Freq: Once | INTRAVENOUS | Status: AC
  Administered 2016-06-29: 20 mg via INTRAVENOUS

## 2016-06-29 NOTE — Patient Instructions (Signed)
Cancer Center Discharge Instructions for Patients Receiving Chemotherapy  Today you received the following chemotherapy agents Taxol   To help prevent nausea and vomiting after your treatment, we encourage you to take your nausea medication as directed.   If you develop nausea and vomiting that is not controlled by your nausea medication, call the clinic.   BELOW ARE SYMPTOMS THAT SHOULD BE REPORTED IMMEDIATELY:  *FEVER GREATER THAN 100.5 F  *CHILLS WITH OR WITHOUT FEVER  NAUSEA AND VOMITING THAT IS NOT CONTROLLED WITH YOUR NAUSEA MEDICATION  *UNUSUAL SHORTNESS OF BREATH  *UNUSUAL BRUISING OR BLEEDING  TENDERNESS IN MOUTH AND THROAT WITH OR WITHOUT PRESENCE OF ULCERS  *URINARY PROBLEMS  *BOWEL PROBLEMS  UNUSUAL RASH Items with * indicate a potential emergency and should be followed up as soon as possible.  Feel free to call the clinic you have any questions or concerns. The clinic phone number is [phone].  Please show the CHEMO ALERT CARD at check-in to the Emergency Department and triage nurse.   

## 2016-06-29 NOTE — Progress Notes (Signed)
  [facility] OFFICE PROGRESS NOTE   Diagnosis:  Esophagus cancer  INTERVAL HISTORY:   [name] returns as scheduled. He completed cycle 2 week 3 Taxol 06/15/2016. He feels a little weak today. He notes an alteration in taste. No nausea or vomiting. No mouth sores. He had a couple of loose stools this morning. He takes Imodium as needed. No numbness or tingling in his hands. He notes tingling at the top tops of the feet.  Objective:  Vital signs in last 24 hours:  Blood pressure (!) 96/49, pulse 90, temperature 98.4 F (36.9 C), temperature source Oral, resp. rate 17, height '5\' 8"'$  (1.727 m), weight 120 lb (54.4 kg), SpO2 100 %.    HEENT: No thrush or ulcers. Resp: Distant breath sounds. Scattered rhonchi. No respiratory distress. Cardio: Regular rate and rhythm. GI: No hepatomegaly. Vascular: No leg edema. Neuro: Alert and oriented.  Port-A-Cath without erythema.   Lab Results:  Lab Results  Component Value Date   WBC 8.7 06/29/2016   HGB 9.4 (L) 06/29/2016   HCT 28.4 (L) 06/29/2016   MCV 88.7 06/29/2016   PLT 366 06/29/2016   NEUTROABS 7.4 (H) 06/29/2016    Imaging:  No results found.  Medications: I have reviewed the patient's current medications.  Assessment/Plan: 1. Adenocarcinoma the esophagus  upper endoscopy 02/17/2016 confirmed a distal esophagus mass  CT chest 01/30/2016-increased in size of bilateral lung nodules  CT abdomen/pelvis 02/23/2016 distal esophagus mass, gastrohepatic adenopathy, iliac and probable thoracic and lumbar metastases, 1.7 cm aortocaval node, nonspecific lung nodules  Biopsy of a left iliac lesion 03/03/2016 confirmed metastatic adenocarcinoma, TTF-1, cytokeratin 7, cytokeratin 20, and CDX-2positive; morphology similar to the distal esophagus biopsy  Cycle 1 FOLFOX 03/24/2016  Cycle 2 FOLFOX 04/06/2016  Restaging CTs 04/27/2016-slight interval progression of pulmonary metastatic disease; progressive osseous  metastatic disease. Enlarging left femoral greater trochanter lesion could be at risk for pathologic fracture.  Weekly Taxol initiated 05/04/2016  2. 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 Prostate cancer treated with external beam radiation  4. COPD  5. Coronary artery disease  6. Peripheral vascular disease  7. Anemia  8. 03/18/2016 Port-A-Cath placement  9. Palliative radiation to the left scapula and left proximal femur 05/03/2016-05/07/2016  10. Diarrhea-etiology unclear  11. Hypokalemia secondary to diarrhea  12. Right footdrop-likely secondary to weight loss and peripheral nerve compression    Disposition: [name] appears unchanged. He has completed 2 cycles of weekly Taxol. Plan to begin cycle 3 today as scheduled. He will return for cycle 3 day 8 07/06/2016. We will see him in follow-up in 2 weeks. He will contact the office in the interim with any problems.  Plan reviewed with Dr. Benay Spice.    Ned Card ANP/GNP-BC   06/29/2016  12:01 PM

## 2016-06-29 NOTE — Patient Instructions (Signed)
Implanted Port Home Guide An implanted port is a type of central line that is placed under the skin. Central lines are used to provide IV access when treatment or nutrition needs to be given through a person's veins. Implanted ports are used for long-term IV access. An implanted port may be placed because:  You need IV medicine that would be irritating to the small veins in your hands or arms.  You need long-term IV medicines, such as antibiotics.  You need IV nutrition for a long period.  You need frequent blood draws for lab tests.  You need dialysis.  Implanted ports are usually placed in the chest area, but they can also be placed in the upper arm, the abdomen, or the leg. An implanted port has two main parts:  Reservoir. The reservoir is round and will appear as a small, raised area under your skin. The reservoir is the part where a needle is inserted to give medicines or draw blood.  Catheter. The catheter is a thin, flexible tube that extends from the reservoir. The catheter is placed into a large vein. Medicine that is inserted into the reservoir goes into the catheter and then into the vein.  How will I care for my incision site? Do not get the incision site wet. Bathe or shower as directed by your health care provider. How is my port accessed? Special steps must be taken to access the port:  Before the port is accessed, a numbing cream can be placed on the skin. This helps numb the skin over the port site.  Your health care provider uses a sterile technique to access the port. ? Your health care provider must put on a mask and sterile gloves. ? The skin over your port is cleaned carefully with an antiseptic and allowed to dry. ? The port is gently pinched between sterile gloves, and a needle is inserted into the port.  Only "non-coring" port needles should be used to access the port. Once the port is accessed, a blood return should be checked. This helps ensure that the port  is in the vein and is not clogged.  If your port needs to remain accessed for a constant infusion, a clear (transparent) bandage will be placed over the needle site. The bandage and needle will need to be changed every week, or as directed by your health care provider.  Keep the bandage covering the needle clean and dry. Do not get it wet. Follow your health care provider's instructions on how to take a shower or bath while the port is accessed.  If your port does not need to stay accessed, no bandage is needed over the port.  What is flushing? Flushing helps keep the port from getting clogged. Follow your health care provider's instructions on how and when to flush the port. Ports are usually flushed with saline solution or a medicine called heparin. The need for flushing will depend on how the port is used.  If the port is used for intermittent medicines or blood draws, the port will need to be flushed: ? After medicines have been given. ? After blood has been drawn. ? As part of routine maintenance.  If a constant infusion is running, the port may not need to be flushed.  How long will my port stay implanted? The port can stay in for as long as your health care provider thinks it is needed. When it is time for the port to come out, surgery will be   done to remove it. The procedure is similar to the one performed when the port was put in. When should I seek immediate medical care? When you have an implanted port, you should seek immediate medical care if:  You notice a bad smell coming from the incision site.  You have swelling, redness, or drainage at the incision site.  You have more swelling or pain at the port site or the surrounding area.  You have a fever that is not controlled with medicine.  This information is not intended to replace advice given to you by your health care provider. Make sure you discuss any questions you have with your health care provider. Document  Released: 03/08/2005 Document Revised: 08/14/2015 Document Reviewed: 11/13/2012 Elsevier Interactive Patient Education  2017 [employer]vier Inc.  

## 2016-07-01 ENCOUNTER — Other Ambulatory Visit: Payer: Self-pay | Admitting: Nurse Practitioner

## 2016-07-01 DIAGNOSIS — C155 Malignant neoplasm of lower third of esophagus: Secondary | ICD-10-CM

## 2016-07-03 ENCOUNTER — Other Ambulatory Visit: Payer: Self-pay | Admitting: Family Medicine

## 2016-07-04 ENCOUNTER — Other Ambulatory Visit: Payer: Self-pay | Admitting: Oncology

## 2016-07-05 NOTE — Telephone Encounter (Signed)
Order hx did not show that dec refill?   In any case=please refill for the year

## 2016-07-05 NOTE — Telephone Encounter (Signed)
Refill printed in December and Dr. Glori Bickers aware. Refill sent to pharmacy electronically as instructed.

## 2016-07-05 NOTE — Telephone Encounter (Signed)
Received refill request electronically Last office visit 02/03/16 Last refill 02/20/16 #90/3

## 2016-07-06 ENCOUNTER — Ambulatory Visit (HOSPITAL_BASED_OUTPATIENT_CLINIC_OR_DEPARTMENT_OTHER): Payer: Medicare Other

## 2016-07-06 ENCOUNTER — Other Ambulatory Visit: Payer: Self-pay | Admitting: *Deleted

## 2016-07-06 ENCOUNTER — Other Ambulatory Visit (HOSPITAL_BASED_OUTPATIENT_CLINIC_OR_DEPARTMENT_OTHER): Payer: Medicare Other

## 2016-07-06 ENCOUNTER — Encounter: Payer: Medicare Other | Admitting: Family Medicine

## 2016-07-06 VITALS — BP 88/56 | HR 84 | Temp 97.9°F | Resp 18

## 2016-07-06 DIAGNOSIS — C7951 Secondary malignant neoplasm of bone: Secondary | ICD-10-CM | POA: Diagnosis not present

## 2016-07-06 DIAGNOSIS — C155 Malignant neoplasm of lower third of esophagus: Secondary | ICD-10-CM | POA: Diagnosis not present

## 2016-07-06 DIAGNOSIS — Z5111 Encounter for antineoplastic chemotherapy: Secondary | ICD-10-CM | POA: Diagnosis not present

## 2016-07-06 DIAGNOSIS — E876 Hypokalemia: Secondary | ICD-10-CM

## 2016-07-06 LAB — CBC WITH DIFFERENTIAL/PLATELET
BASO%: 0.2 % (ref 0.0–2.0)
BASOS ABS: 0 10*3/uL (ref 0.0–0.1)
EOS%: 0.4 % (ref 0.0–7.0)
Eosinophils Absolute: 0 10*3/uL (ref 0.0–0.5)
HEMATOCRIT: 28.2 % — AB (ref 38.4–49.9)
HEMOGLOBIN: 9 g/dL — AB (ref 13.0–17.1)
LYMPH#: 0.7 10*3/uL — AB (ref 0.9–3.3)
LYMPH%: 8.4 % — AB (ref 14.0–49.0)
MCH: 28.2 pg (ref 27.2–33.4)
MCHC: 31.9 g/dL — AB (ref 32.0–36.0)
MCV: 88.4 fL (ref 79.3–98.0)
MONO#: 0.3 10*3/uL (ref 0.1–0.9)
MONO%: 3.7 % (ref 0.0–14.0)
NEUT#: 7.2 10*3/uL — ABNORMAL HIGH (ref 1.5–6.5)
NEUT%: 87.3 % — AB (ref 39.0–75.0)
Platelets: 381 10*3/uL (ref 140–400)
RBC: 3.19 10*6/uL — ABNORMAL LOW (ref 4.20–5.82)
RDW: 22.4 % — ABNORMAL HIGH (ref 11.0–14.6)
WBC: 8.3 10*3/uL (ref 4.0–10.3)
nRBC: 0 % (ref 0–0)

## 2016-07-06 LAB — COMPREHENSIVE METABOLIC PANEL
ALBUMIN: 3.1 g/dL — AB (ref 3.5–5.0)
ALK PHOS: 78 U/L (ref 40–150)
ALT: 11 U/L (ref 0–55)
ANION GAP: 9 meq/L (ref 3–11)
AST: 12 U/L (ref 5–34)
BUN: 15.8 mg/dL (ref 7.0–26.0)
CHLORIDE: 99 meq/L (ref 98–109)
CO2: 27 mEq/L (ref 22–29)
Calcium: 9.1 mg/dL (ref 8.4–10.4)
Creatinine: 0.7 mg/dL (ref 0.7–1.3)
Glucose: 148 mg/dl — ABNORMAL HIGH (ref 70–140)
POTASSIUM: 4.3 meq/L (ref 3.5–5.1)
Sodium: 135 mEq/L — ABNORMAL LOW (ref 136–145)
Total Bilirubin: 0.27 mg/dL (ref 0.20–1.20)
Total Protein: 6.2 g/dL — ABNORMAL LOW (ref 6.4–8.3)

## 2016-07-06 MED ORDER — SODIUM CHLORIDE 0.9% FLUSH
10.0000 mL | INTRAVENOUS | Status: DC | PRN
  Administered 2016-07-06: 10 mL
  Filled 2016-07-06: qty 10

## 2016-07-06 MED ORDER — FAMOTIDINE IN NACL 20-0.9 MG/50ML-% IV SOLN
20.0000 mg | Freq: Once | INTRAVENOUS | Status: AC
  Administered 2016-07-06: 20 mg via INTRAVENOUS

## 2016-07-06 MED ORDER — SODIUM CHLORIDE 0.9 % IV SOLN
Freq: Once | INTRAVENOUS | Status: AC
  Administered 2016-07-06: 13:00:00 via INTRAVENOUS

## 2016-07-06 MED ORDER — DIPHENHYDRAMINE HCL 50 MG/ML IJ SOLN
INTRAMUSCULAR | Status: AC
  Filled 2016-07-06: qty 1

## 2016-07-06 MED ORDER — POTASSIUM CHLORIDE 20 MEQ/15ML (10%) PO SOLN: 20.0000 meq | Freq: Every day | ORAL | 0 refills | Status: DC

## 2016-07-06 MED ORDER — DIPHENHYDRAMINE HCL 50 MG/ML IJ SOLN
25.0000 mg | Freq: Once | INTRAMUSCULAR | Status: AC
Start: 2016-07-06 — End: 2016-07-06
  Administered 2016-07-06: 25 mg via INTRAVENOUS

## 2016-07-06 MED ORDER — FAMOTIDINE IN NACL 20-0.9 MG/50ML-% IV SOLN
INTRAVENOUS | Status: AC
  Filled 2016-07-06: qty 50

## 2016-07-06 MED ORDER — DEXAMETHASONE SODIUM PHOSPHATE 10 MG/ML IJ SOLN
10.0000 mg | Freq: Once | INTRAMUSCULAR | Status: AC
  Administered 2016-07-06: 10 mg via INTRAVENOUS

## 2016-07-06 MED ORDER — DEXAMETHASONE SODIUM PHOSPHATE 10 MG/ML IJ SOLN
INTRAMUSCULAR | Status: AC
  Filled 2016-07-06: qty 1

## 2016-07-06 MED ORDER — HEPARIN SOD (PORK) LOCK FLUSH 100 UNIT/ML IV SOLN
500.0000 [IU] | Freq: Once | INTRAVENOUS | Status: AC | PRN
  Administered 2016-07-06: 500 [IU]
  Filled 2016-07-06: qty 5

## 2016-07-06 MED ORDER — DEXTROSE 5 % IV SOLN
80.0000 mg/m2 | Freq: Once | INTRAVENOUS | Status: AC
  Administered 2016-07-06: 132 mg via INTRAVENOUS
  Filled 2016-07-06: qty 22

## 2016-07-06 NOTE — Patient Instructions (Signed)
[facility] Discharge Instructions for Patients Receiving Chemotherapy  Today you received the following chemotherapy agents:  Taxol  To help prevent nausea and vomiting after your treatment, we encourage you to take your nausea medication as prescribed.   If you develop nausea and vomiting that is not controlled by your nausea medication, call the clinic.   BELOW ARE SYMPTOMS THAT SHOULD BE REPORTED IMMEDIATELY:  *FEVER GREATER THAN 100.5 F  *CHILLS WITH OR WITHOUT FEVER  NAUSEA AND VOMITING THAT IS NOT CONTROLLED WITH YOUR NAUSEA MEDICATION  *UNUSUAL SHORTNESS OF BREATH  *UNUSUAL BRUISING OR BLEEDING  TENDERNESS IN MOUTH AND THROAT WITH OR WITHOUT PRESENCE OF ULCERS  *URINARY PROBLEMS  *BOWEL PROBLEMS  UNUSUAL RASH Items with * indicate a potential emergency and should be followed up as soon as possible.  Feel free to call the clinic you have any questions or concerns. The clinic phone number is [phone].  Please show the CHEMO ALERT CARD at check-in to the Emergency Department and triage nurse.   

## 2016-07-07 ENCOUNTER — Other Ambulatory Visit: Payer: Self-pay | Admitting: Nurse Practitioner

## 2016-07-07 DIAGNOSIS — C155 Malignant neoplasm of lower third of esophagus: Secondary | ICD-10-CM

## 2016-07-07 MED ORDER — LIDOCAINE-PRILOCAINE 2.5-2.5 % EX CREA: TOPICAL_CREAM | CUTANEOUS | 2 refills | Status: AC

## 2016-07-08 ENCOUNTER — Other Ambulatory Visit: Payer: Self-pay | Admitting: *Deleted

## 2016-07-08 DIAGNOSIS — C155 Malignant neoplasm of lower third of esophagus: Secondary | ICD-10-CM

## 2016-07-08 MED ORDER — PROCHLORPERAZINE MALEATE 5 MG PO TABS: 5.0000 mg | ORAL_TABLET | Freq: Four times a day (QID) | ORAL | 0 refills | Status: DC | PRN

## 2016-07-11 ENCOUNTER — Other Ambulatory Visit: Payer: Self-pay | Admitting: Oncology

## 2016-07-13 ENCOUNTER — Telehealth: Payer: Self-pay | Admitting: Oncology

## 2016-07-13 ENCOUNTER — Ambulatory Visit (HOSPITAL_BASED_OUTPATIENT_CLINIC_OR_DEPARTMENT_OTHER): Payer: Medicare Other | Admitting: Oncology

## 2016-07-13 ENCOUNTER — Ambulatory Visit: Payer: Medicare Other

## 2016-07-13 ENCOUNTER — Ambulatory Visit (HOSPITAL_BASED_OUTPATIENT_CLINIC_OR_DEPARTMENT_OTHER): Payer: Medicare Other

## 2016-07-13 ENCOUNTER — Other Ambulatory Visit (HOSPITAL_BASED_OUTPATIENT_CLINIC_OR_DEPARTMENT_OTHER): Payer: Medicare Other

## 2016-07-13 VITALS — BP 103/52 | HR 83 | Temp 98.3°F | Resp 18 | Ht 68.0 in | Wt 128.2 lb

## 2016-07-13 DIAGNOSIS — R197 Diarrhea, unspecified: Secondary | ICD-10-CM

## 2016-07-13 DIAGNOSIS — Z95828 Presence of other vascular implants and grafts: Secondary | ICD-10-CM

## 2016-07-13 DIAGNOSIS — C155 Malignant neoplasm of lower third of esophagus: Secondary | ICD-10-CM

## 2016-07-13 DIAGNOSIS — C7951 Secondary malignant neoplasm of bone: Secondary | ICD-10-CM | POA: Diagnosis not present

## 2016-07-13 DIAGNOSIS — Z85118 Personal history of other malignant neoplasm of bronchus and lung: Secondary | ICD-10-CM

## 2016-07-13 DIAGNOSIS — D6481 Anemia due to antineoplastic chemotherapy: Secondary | ICD-10-CM | POA: Diagnosis not present

## 2016-07-13 DIAGNOSIS — Z5111 Encounter for antineoplastic chemotherapy: Secondary | ICD-10-CM | POA: Diagnosis not present

## 2016-07-13 DIAGNOSIS — Z8546 Personal history of malignant neoplasm of prostate: Secondary | ICD-10-CM

## 2016-07-13 DIAGNOSIS — E876 Hypokalemia: Secondary | ICD-10-CM | POA: Diagnosis not present

## 2016-07-13 LAB — COMPREHENSIVE METABOLIC PANEL
ALT: 8 U/L (ref 0–55)
AST: 12 U/L (ref 5–34)
Albumin: 2.8 g/dL — ABNORMAL LOW (ref 3.5–5.0)
Alkaline Phosphatase: 67 U/L (ref 40–150)
Anion Gap: 10 mEq/L (ref 3–11)
BUN: 16.4 mg/dL (ref 7.0–26.0)
CHLORIDE: 104 meq/L (ref 98–109)
CO2: 24 mEq/L (ref 22–29)
Calcium: 8.4 mg/dL (ref 8.4–10.4)
Creatinine: 0.7 mg/dL (ref 0.7–1.3)
EGFR: >90 mL/min/{1.73_m2} (ref >90)
GLUCOSE: 110 mg/dL (ref 70–140)
Potassium: 3.3 mEq/L — ABNORMAL LOW (ref 3.5–5.1)
SODIUM: 137 meq/L (ref 136–145)
Total Bilirubin: <0.22 mg/dL (ref 0.20–1.20)
Total Protein: 5.7 g/dL — ABNORMAL LOW (ref 6.4–8.3)

## 2016-07-13 LAB — CBC WITH DIFFERENTIAL/PLATELET
BASO%: 0.8 % (ref 0.0–2.0)
Basophils Absolute: 0.1 10*3/uL (ref 0.0–0.1)
EOS%: 1.6 % (ref 0.0–7.0)
Eosinophils Absolute: 0.1 10*3/uL (ref 0.0–0.5)
HEMATOCRIT: 25.3 % — AB (ref 38.4–49.9)
HGB: 8 g/dL — ABNORMAL LOW (ref 13.0–17.1)
LYMPH#: 0.9 10*3/uL (ref 0.9–3.3)
LYMPH%: 14.5 % (ref 14.0–49.0)
MCH: 28.1 pg (ref 27.2–33.4)
MCHC: 31.6 g/dL — ABNORMAL LOW (ref 32.0–36.0)
MCV: 88.8 fL (ref 79.3–98.0)
MONO#: 0.5 10*3/uL (ref 0.1–0.9)
MONO%: 8.5 % (ref 0.0–14.0)
NEUT%: 74.6 % (ref 39.0–75.0)
NEUTROS ABS: 4.7 10*3/uL (ref 1.5–6.5)
Platelets: 342 10*3/uL (ref 140–400)
RBC: 2.85 10*6/uL — ABNORMAL LOW (ref 4.20–5.82)
RDW: 22.1 % — ABNORMAL HIGH (ref 11.0–14.6)
WBC: 6.3 10*3/uL (ref 4.0–10.3)
nRBC: 0 % (ref 0–0)

## 2016-07-13 LAB — TECHNOLOGIST REVIEW

## 2016-07-13 MED ORDER — DEXAMETHASONE SODIUM PHOSPHATE 10 MG/ML IJ SOLN
INTRAMUSCULAR | Status: AC
Start: 2016-07-13 — End: 2016-07-13
  Filled 2016-07-13: qty 1

## 2016-07-13 MED ORDER — SODIUM CHLORIDE 0.9 % IV SOLN
Freq: Once | INTRAVENOUS | Status: AC
  Administered 2016-07-13: 10:00:00 via INTRAVENOUS

## 2016-07-13 MED ORDER — SODIUM CHLORIDE 0.9% FLUSH
10.0000 mL | INTRAVENOUS | Status: DC | PRN
  Administered 2016-07-13: 10 mL via INTRAVENOUS
  Filled 2016-07-13: qty 10

## 2016-07-13 MED ORDER — HEPARIN SOD (PORK) LOCK FLUSH 100 UNIT/ML IV SOLN
500.0000 [IU] | Freq: Once | INTRAVENOUS | Status: AC | PRN
  Administered 2016-07-13: 500 [IU]
  Filled 2016-07-13: qty 5

## 2016-07-13 MED ORDER — SODIUM CHLORIDE 0.9% FLUSH
10.0000 mL | INTRAVENOUS | Status: DC | PRN
  Administered 2016-07-13: 10 mL
  Filled 2016-07-13: qty 10

## 2016-07-13 MED ORDER — FAMOTIDINE IN NACL 20-0.9 MG/50ML-% IV SOLN
INTRAVENOUS | Status: AC
  Filled 2016-07-13: qty 50

## 2016-07-13 MED ORDER — DEXTROSE 5 % IV SOLN
80.0000 mg/m2 | Freq: Once | INTRAVENOUS | Status: AC
  Administered 2016-07-13: 132 mg via INTRAVENOUS
  Filled 2016-07-13: qty 22

## 2016-07-13 MED ORDER — FAMOTIDINE IN NACL 20-0.9 MG/50ML-% IV SOLN
20.0000 mg | Freq: Once | INTRAVENOUS | Status: AC
  Administered 2016-07-13: 20 mg via INTRAVENOUS

## 2016-07-13 MED ORDER — DIPHENHYDRAMINE HCL 50 MG/ML IJ SOLN
25.0000 mg | Freq: Once | INTRAMUSCULAR | Status: AC
  Administered 2016-07-13: 25 mg via INTRAVENOUS

## 2016-07-13 MED ORDER — DIPHENHYDRAMINE HCL 50 MG/ML IJ SOLN
INTRAMUSCULAR | Status: AC
  Filled 2016-07-13: qty 1

## 2016-07-13 MED ORDER — DEXAMETHASONE SODIUM PHOSPHATE 10 MG/ML IJ SOLN
10.0000 mg | Freq: Once | INTRAMUSCULAR | Status: AC
  Administered 2016-07-13: 10 mg via INTRAVENOUS

## 2016-07-13 NOTE — Patient Instructions (Signed)
Paclitaxel injection What is this medicine? PACLITAXEL (PAK li TAX el) is a chemotherapy drug. It targets fast dividing cells, like cancer cells, and causes these cells to die. This medicine is used to treat ovarian cancer, breast cancer, and other cancers. This medicine may be used for other purposes; ask your health care provider or pharmacist if you have questions. COMMON BRAND NAME(S): Onxol, Taxol What should I tell my health care provider before I take this medicine? They need to know if you have any of these conditions: -blood disorders -irregular heartbeat -infection (especially a virus infection such as chickenpox, cold sores, or herpes) -liver disease -previous or ongoing radiation therapy -an unusual or allergic reaction to paclitaxel, alcohol, polyoxyethylated castor oil, other chemotherapy agents, other medicines, foods, dyes, or preservatives -pregnant or trying to get pregnant -breast-feeding How should I use this medicine? This drug is given as an infusion into a vein. It is administered in a hospital or clinic by a specially trained health care professional. Talk to your pediatrician regarding the use of this medicine in children. Special care may be needed. Overdosage: If you think you have taken too much of this medicine contact a poison control center or emergency room at once. NOTE: This medicine is only for you. Do not share this medicine with others. What if I miss a dose? It is important not to miss your dose. Call your doctor or health care professional if you are unable to keep an appointment. What may interact with this medicine? Do not take this medicine with any of the following medications: -disulfiram -metronidazole This medicine may also interact with the following medications: -cyclosporine -diazepam -ketoconazole -medicines to increase blood counts like filgrastim, pegfilgrastim, sargramostim -other chemotherapy drugs like cisplatin, doxorubicin,  epirubicin, etoposide, teniposide, vincristine -quinidine -testosterone -vaccines -verapamil Talk to your doctor or health care professional before taking any of these medicines: -acetaminophen -aspirin -ibuprofen -ketoprofen -naproxen This list may not describe all possible interactions. Give your health care provider a list of all the medicines, herbs, non-prescription drugs, or dietary supplements you use. Also tell them if you smoke, drink alcohol, or use illegal drugs. Some items may interact with your medicine. What should I watch for while using this medicine? Your condition will be monitored carefully while you are receiving this medicine. You will need important blood work done while you are taking this medicine. This medicine can cause serious allergic reactions. To reduce your risk you will need to take other medicine(s) before treatment with this medicine. If you experience allergic reactions like skin rash, itching or hives, swelling of the face, lips, or tongue, tell your doctor or health care professional right away. In some cases, you may be given additional medicines to help with side effects. Follow all directions for their use. This drug may make you feel generally unwell. This is not uncommon, as chemotherapy can affect healthy cells as well as cancer cells. Report any side effects. Continue your course of treatment even though you feel ill unless your doctor tells you to stop. Call your doctor or health care professional for advice if you get a fever, chills or sore throat, or other symptoms of a cold or flu. Do not treat yourself. This drug decreases your body's ability to fight infections. Try to avoid being around people who are sick. This medicine may increase your risk to bruise or bleed. Call your doctor or health care professional if you notice any unusual bleeding. Be careful brushing and flossing your teeth or   using a toothpick because you may get an infection or  bleed more easily. If you have any dental work done, tell your dentist you are receiving this medicine. Avoid taking products that contain aspirin, acetaminophen, ibuprofen, naproxen, or ketoprofen unless instructed by your doctor. These medicines may hide a fever. Do not become pregnant while taking this medicine. Women should inform their doctor if they wish to become pregnant or think they might be pregnant. There is a potential for serious side effects to an unborn child. Talk to your health care professional or pharmacist for more information. Do not breast-feed an infant while taking this medicine. Men are advised not to father a child while receiving this medicine. This product may contain alcohol. Ask your pharmacist or healthcare provider if this medicine contains alcohol. Be sure to tell all healthcare providers you are taking this medicine. Certain medicines, like metronidazole and disulfiram, can cause an unpleasant reaction when taken with alcohol. The reaction includes flushing, headache, nausea, vomiting, sweating, and increased thirst. The reaction can last from 30 minutes to several hours. What side effects may I notice from receiving this medicine? Side effects that you should report to your doctor or health care professional as soon as possible: -allergic reactions like skin rash, itching or hives, swelling of the face, lips, or tongue -low blood counts - This drug may decrease the number of white blood cells, red blood cells and platelets. You may be at increased risk for infections and bleeding. -signs of infection - fever or chills, cough, sore throat, pain or difficulty passing urine -signs of decreased platelets or bleeding - bruising, pinpoint red spots on the skin, black, tarry stools, nosebleeds -signs of decreased red blood cells - unusually weak or tired, fainting spells, lightheadedness -breathing problems -chest pain -high or low blood pressure -mouth sores -nausea and  vomiting -pain, swelling, redness or irritation at the injection site -pain, tingling, numbness in the hands or feet -slow or irregular heartbeat -swelling of the ankle, feet, hands Side effects that usually do not require medical attention (report to your doctor or health care professional if they continue or are bothersome): -bone pain -complete hair loss including hair on your head, underarms, pubic hair, eyebrows, and eyelashes -changes in the color of fingernails -diarrhea -loosening of the fingernails -loss of appetite -muscle or joint pain -red flush to skin -sweating This list may not describe all possible side effects. Call your doctor for medical advice about side effects. You may report side effects to FDA at 1-800-FDA-1088. Where should I keep my medicine? This drug is given in a hospital or clinic and will not be stored at home. NOTE: This sheet is a summary. It may not cover all possible information. If you have questions about this medicine, talk to your doctor, pharmacist, or health care provider.  2018 Elsevier/Gold Standard (2015-01-07 19:58:00)  

## 2016-07-13 NOTE — Progress Notes (Signed)
  [facility] OFFICE PROGRESS NOTE   Diagnosis: Esophagus cancer  INTERVAL HISTORY:   Mr. Zehner returns as scheduled. He continues weekly Taxol. He is scheduled to complete cycle 3 today. No falls. Minimal numbness in the fingers. No pain. He continues to live alone.  Objective:  Vital signs in last 24 hours:  Blood pressure (!) 103/52, pulse 83, temperature 98.3 F (36.8 C), temperature source Oral, resp. rate 18, height '5\' 8"'$  (1.727 m), weight 128 lb 3.2 oz (58.2 kg), SpO2 93 %.    HEENT: No thrush or ulcers Resp: Scattered wheezes and rhonchi, no respiratory distress Cardio: Regular rate and rhythm GI: No hepatosplenomegaly, nontender Vascular: Trace ankle edema bilaterally  Portacath/PICC-without erythema  Lab Results:  Lab Results  Component Value Date   WBC 6.3 07/13/2016   HGB 8.0 (L) 07/13/2016   HCT 25.3 (L) 07/13/2016   MCV 88.8 07/13/2016   PLT 342 07/13/2016   NEUTROABS 4.7 07/13/2016     Medications: I have reviewed the patient's current medications.  Assessment/Plan: 1.Adenocarcinoma the esophagus  upper endoscopy 02/17/2016 confirmed a distal esophagus mass  CT chest 01/30/2016-increased in size of bilateral lung nodules  CT abdomen/pelvis 02/23/2016 distal esophagus mass, gastrohepatic adenopathy, iliac and probable thoracic and lumbar metastases, 1.7 cm aortocaval node, nonspecific lung nodules  Biopsy of a left iliac lesion 03/03/2016 confirmed metastatic adenocarcinoma, TTF-1, cytokeratin 7, cytokeratin 20, and CDX-2positive; morphology similar to the distal esophagus biopsy  Cycle 1 FOLFOX 03/24/2016  Cycle 2 FOLFOX 04/06/2016  Restaging CTs 04/27/2016-slight interval progression of pulmonary metastatic disease; progressive osseous metastatic disease. Enlarging left femoral greater trochanter lesion could be at risk for pathologic fracture.  Weekly Taxol initiated 05/04/2016  2. Stage Ia (T1 N0) non-small cell  carcinoma, favoring squamous cell carcinoma,of the right lungOctober 2016,treated with SRS, completed 02/25/2015  3. Prostate cancer treated with external beam radiation  4. COPD  5. Coronary artery disease  6. 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 03/18/2016 Port-A-Cath placement  9. Palliative radiation to the left scapula and left proximal femur 05/03/2016-05/07/2016  10. Diarrhea-etiology unclear  11. History of Hypokalemia secondary to diarrhea  12. History ofRight footdrop-likely secondary to weight loss and peripheral nerve compression   Disposition:  Mr. Carignan appears stable. His overall performance status has improved since beginning Taxol. He has gained weight. He no longer has pain after palliative radiation. He will complete another treatment with Taxol today. He will undergo restaging CTs and return for an office visit on 07/27/2016.  [name], MD  07/13/2016  9:22 AM

## 2016-07-13 NOTE — Progress Notes (Signed)
Nutrition Follow-up:  Patient seen in infusion this am.  Patient with metastatic esophageal cancer. Patient is followed by Dr. Benay Spice.  Patient reports that his appetite is good.  " I have gained some weight and I am happy about that."  Reports improved swallowing.  Reports ate pot roast with broccoli and noodles and sweet potato last night for dinner.  I also eat ice cream every night before bed.  Reports that he is drinking ensure plus 2 per day.  " I am still eating my nuts for snack."  Reports that his daughter brings him dinner every night.    No other nutrition symptoms reported today.  Medications: reviewed  Labs: K 3.3  Anthropometrics:   Weight has increased to 128 lb 3.2 oz today from 120 lb 9.6 oz on 3/27  NUTRITION DIAGNOSIS: Severe malnutrition improving   INTERVENTION:   Encouraged patient to continue to eat every 2-3 hours of high calorie, high protein foods Encouraged patient to continue to drink oral nutrition supplement with at least 350 calories or more.    MONITORING, EVALUATION, GOAL: Patient will work to increase oral nutrition supplements and intake to minimize further weight loss   NEXT VISIT: June 6 during infusion  Alder Murri B. Zenia Resides, [place], [place] Registered Dietitian [phone] (pager)

## 2016-07-13 NOTE — Patient Instructions (Signed)
Implanted Port Home Guide An implanted port is a type of central line that is placed under the skin. Central lines are used to provide IV access when treatment or nutrition needs to be given through a person's veins. Implanted ports are used for long-term IV access. An implanted port may be placed because:  You need IV medicine that would be irritating to the small veins in your hands or arms.  You need long-term IV medicines, such as antibiotics.  You need IV nutrition for a long period.  You need frequent blood draws for lab tests.  You need dialysis.  Implanted ports are usually placed in the chest area, but they can also be placed in the upper arm, the abdomen, or the leg. An implanted port has two main parts:  Reservoir. The reservoir is round and will appear as a small, raised area under your skin. The reservoir is the part where a needle is inserted to give medicines or draw blood.  Catheter. The catheter is a thin, flexible tube that extends from the reservoir. The catheter is placed into a large vein. Medicine that is inserted into the reservoir goes into the catheter and then into the vein.  How will I care for my incision site? Do not get the incision site wet. Bathe or shower as directed by your health care provider. How is my port accessed? Special steps must be taken to access the port:  Before the port is accessed, a numbing cream can be placed on the skin. This helps numb the skin over the port site.  Your health care provider uses a sterile technique to access the port. ? Your health care provider must put on a mask and sterile gloves. ? The skin over your port is cleaned carefully with an antiseptic and allowed to dry. ? The port is gently pinched between sterile gloves, and a needle is inserted into the port.  Only "non-coring" port needles should be used to access the port. Once the port is accessed, a blood return should be checked. This helps ensure that the port  is in the vein and is not clogged.  If your port needs to remain accessed for a constant infusion, a clear (transparent) bandage will be placed over the needle site. The bandage and needle will need to be changed every week, or as directed by your health care provider.  Keep the bandage covering the needle clean and dry. Do not get it wet. Follow your health care provider's instructions on how to take a shower or bath while the port is accessed.  If your port does not need to stay accessed, no bandage is needed over the port.  What is flushing? Flushing helps keep the port from getting clogged. Follow your health care provider's instructions on how and when to flush the port. Ports are usually flushed with saline solution or a medicine called heparin. The need for flushing will depend on how the port is used.  If the port is used for intermittent medicines or blood draws, the port will need to be flushed: ? After medicines have been given. ? After blood has been drawn. ? As part of routine maintenance.  If a constant infusion is running, the port may not need to be flushed.  How long will my port stay implanted? The port can stay in for as long as your health care provider thinks it is needed. When it is time for the port to come out, surgery will be   done to remove it. The procedure is similar to the one performed when the port was put in. When should I seek immediate medical care? When you have an implanted port, you should seek immediate medical care if:  You notice a bad smell coming from the incision site.  You have swelling, redness, or drainage at the incision site.  You have more swelling or pain at the port site or the surrounding area.  You have a fever that is not controlled with medicine.  This information is not intended to replace advice given to you by your health care provider. Make sure you discuss any questions you have with your health care provider. Document  Released: 03/08/2005 Document Revised: 08/14/2015 Document Reviewed: 11/13/2012 Elsevier Interactive Patient Education  2017 [employer]vier Inc.  

## 2016-07-13 NOTE — Telephone Encounter (Signed)
Gave patient AVS and calender per 4/24 los.

## 2016-07-14 MED ORDER — PREDNISONE 20 MG PO TABS: 10.0000 mg | ORAL_TABLET | Freq: Every day | ORAL | 2 refills | Status: DC

## 2016-07-14 NOTE — Addendum Note (Signed)
Addended by: Brien Few on: 07/14/2016 10:38 AM   Modules accepted: Orders

## 2016-07-20 ENCOUNTER — Ambulatory Visit (HOSPITAL_COMMUNITY): Payer: Medicare Other

## 2016-07-21 ENCOUNTER — Other Ambulatory Visit: Payer: Self-pay | Admitting: Family Medicine

## 2016-07-21 ENCOUNTER — Ambulatory Visit (HOSPITAL_COMMUNITY)
Admission: RE | Admit: 2016-07-21 | Discharge: 2016-07-21 | Disposition: A | Discharge status: Home / Self Care | Payer: Medicare Other | Source: Ambulatory Visit | Attending: Nurse Practitioner | Admitting: Nurse Practitioner

## 2016-07-21 ENCOUNTER — Encounter (HOSPITAL_COMMUNITY): Payer: Self-pay

## 2016-07-21 DIAGNOSIS — C7951 Secondary malignant neoplasm of bone: Secondary | ICD-10-CM | POA: Insufficient documentation

## 2016-07-21 DIAGNOSIS — R590 Localized enlarged lymph nodes: Secondary | ICD-10-CM | POA: Insufficient documentation

## 2016-07-21 DIAGNOSIS — I714 Abdominal aortic aneurysm, without rupture: Secondary | ICD-10-CM | POA: Insufficient documentation

## 2016-07-21 DIAGNOSIS — K4090 Unilateral inguinal hernia, without obstruction or gangrene, not specified as recurrent: Secondary | ICD-10-CM | POA: Insufficient documentation

## 2016-07-21 DIAGNOSIS — C155 Malignant neoplasm of lower third of esophagus: Secondary | ICD-10-CM | POA: Diagnosis not present

## 2016-07-21 DIAGNOSIS — C3490 Malignant neoplasm of unspecified part of unspecified bronchus or lung: Secondary | ICD-10-CM | POA: Diagnosis not present

## 2016-07-21 MED ORDER — IOPAMIDOL (ISOVUE-300) INJECTION 61%
INTRAVENOUS | Status: AC
  Administered 2016-07-21: 100 mL via INTRAVENOUS
  Filled 2016-07-21: qty 100

## 2016-07-22 ENCOUNTER — Telehealth: Payer: Self-pay | Admitting: *Deleted

## 2016-07-22 ENCOUNTER — Encounter: Payer: Self-pay | Admitting: Oncology

## 2016-07-22 ENCOUNTER — Other Ambulatory Visit: Payer: Self-pay | Admitting: Emergency Medicine

## 2016-07-22 NOTE — Progress Notes (Signed)
Received call from patient's daughterPOA) Magda Paganini regarding needing financial assistance for medical bills for her dad. She was very frustrated. Advised patient if he has a balance of $5,000 or more, they may apply for the hardship settlement financial assistance. Advised her I could send in the mail along with the list of supporting documents required to go along with application. Verified address. Called her back to advise he may also apply for the one-time $400 [place] that will help with personal expenses. She became very emotional and states to send that information as well. Advised her that income information would be needed and if she can bring on 07/27/16. She states she can. Will have paperwork ready to discuss both [facility] FAA for the hardship and the [employer]. Will also offer a Medicaid application as well. Will see 07/27/16 after infusion.

## 2016-07-22 NOTE — Telephone Encounter (Signed)
-----   Message from Ladell Pier, MD sent at 07/21/2016  8:01 PM EDT ----- Please call patient, ct shows overall stable cancer in bones and lungs, f/u as scheduled

## 2016-07-22 NOTE — Telephone Encounter (Signed)
Patients daughter called very upset and concerned about telephone message to call the office.  Reassured her this was in regards to the CT Scan- relayed results to her per Dr. Benay Spice. She continued to be tearful and upset. She apologized and explained that she thought the results were worse and that is why we were calling. Again reassured her the ct was stable, we confirmed the next appt. She understands to call the office in the interm if any other questions or needs arise.

## 2016-07-22 NOTE — Telephone Encounter (Signed)
Pt called and [facility] [address] does not have mirtazapine refill; I spoke with Remo Lipps at [facility] [address] and he could not find # [phone] x [phone] that was sent electronically on 07/05/16. Medication phoned to [facility] as instructed. Pt voiced understanding and will ck with pharmacy later.

## 2016-07-22 NOTE — Telephone Encounter (Signed)
Spoke with pt, informed him of stable CT scan. Follow up as scheduled, per Dr. Benay Spice. Pt voiced understanding. Next appointment confirmed.

## 2016-07-24 ENCOUNTER — Other Ambulatory Visit: Payer: Self-pay | Admitting: Oncology

## 2016-07-27 ENCOUNTER — Ambulatory Visit (HOSPITAL_BASED_OUTPATIENT_CLINIC_OR_DEPARTMENT_OTHER): Payer: Medicare Other

## 2016-07-27 ENCOUNTER — Other Ambulatory Visit (HOSPITAL_BASED_OUTPATIENT_CLINIC_OR_DEPARTMENT_OTHER): Payer: Medicare Other

## 2016-07-27 ENCOUNTER — Encounter: Payer: Self-pay | Admitting: Oncology

## 2016-07-27 ENCOUNTER — Ambulatory Visit: Payer: Medicare Other

## 2016-07-27 ENCOUNTER — Ambulatory Visit (HOSPITAL_BASED_OUTPATIENT_CLINIC_OR_DEPARTMENT_OTHER): Payer: Medicare Other | Admitting: Oncology

## 2016-07-27 DIAGNOSIS — Z5111 Encounter for antineoplastic chemotherapy: Secondary | ICD-10-CM

## 2016-07-27 DIAGNOSIS — C7951 Secondary malignant neoplasm of bone: Secondary | ICD-10-CM | POA: Diagnosis not present

## 2016-07-27 DIAGNOSIS — C7800 Secondary malignant neoplasm of unspecified lung: Secondary | ICD-10-CM | POA: Diagnosis not present

## 2016-07-27 DIAGNOSIS — Z8546 Personal history of malignant neoplasm of prostate: Secondary | ICD-10-CM | POA: Diagnosis not present

## 2016-07-27 DIAGNOSIS — C155 Malignant neoplasm of lower third of esophagus: Secondary | ICD-10-CM

## 2016-07-27 DIAGNOSIS — Z85118 Personal history of other malignant neoplasm of bronchus and lung: Secondary | ICD-10-CM

## 2016-07-27 DIAGNOSIS — D6481 Anemia due to antineoplastic chemotherapy: Secondary | ICD-10-CM | POA: Diagnosis not present

## 2016-07-27 DIAGNOSIS — Z95828 Presence of other vascular implants and grafts: Secondary | ICD-10-CM

## 2016-07-27 LAB — CBC WITH DIFFERENTIAL/PLATELET
BASO%: 0.5 % (ref 0.0–2.0)
Basophils Absolute: 0 10*3/uL (ref 0.0–0.1)
EOS%: 1.5 % (ref 0.0–7.0)
Eosinophils Absolute: 0.1 10*3/uL (ref 0.0–0.5)
HCT: 25 % — ABNORMAL LOW (ref 38.4–49.9)
HEMOGLOBIN: 8.3 g/dL — AB (ref 13.0–17.1)
LYMPH%: 13.4 % — AB (ref 14.0–49.0)
MCH: 29.5 pg (ref 27.2–33.4)
MCHC: 33.2 g/dL (ref 32.0–36.0)
MCV: 88.7 fL (ref 79.3–98.0)
MONO#: 0.7 10*3/uL (ref 0.1–0.9)
MONO%: 9.5 % (ref 0.0–14.0)
NEUT#: 5.8 10*3/uL (ref 1.5–6.5)
NEUT%: 75.1 % — ABNORMAL HIGH (ref 39.0–75.0)
Platelets: 380 10*3/uL (ref 140–400)
RBC: 2.82 10*6/uL — AB (ref 4.20–5.82)
RDW: 23 % — AB (ref 11.0–14.6)
WBC: 7.7 10*3/uL (ref 4.0–10.3)
lymph#: 1 10*3/uL (ref 0.9–3.3)

## 2016-07-27 LAB — COMPREHENSIVE METABOLIC PANEL
ALT: 10 U/L (ref 0–55)
AST: 13 U/L (ref 5–34)
Albumin: 2.8 g/dL — ABNORMAL LOW (ref 3.5–5.0)
Alkaline Phosphatase: 82 U/L (ref 40–150)
Anion Gap: 10 mEq/L (ref 3–11)
BUN: 16.6 mg/dL (ref 7.0–26.0)
CO2: 26 meq/L (ref 22–29)
Calcium: 8.7 mg/dL (ref 8.4–10.4)
Chloride: 103 mEq/L (ref 98–109)
Creatinine: 0.7 mg/dL (ref 0.7–1.3)
GLUCOSE: 111 mg/dL (ref 70–140)
Potassium: 3.6 mEq/L (ref 3.5–5.1)
SODIUM: 139 meq/L (ref 136–145)
TOTAL PROTEIN: 6 g/dL — AB (ref 6.4–8.3)

## 2016-07-27 LAB — TECHNOLOGIST REVIEW

## 2016-07-27 MED ORDER — DEXAMETHASONE SODIUM PHOSPHATE 10 MG/ML IJ SOLN
INTRAMUSCULAR | Status: AC
  Filled 2016-07-27: qty 1

## 2016-07-27 MED ORDER — FAMOTIDINE IN NACL 20-0.9 MG/50ML-% IV SOLN
INTRAVENOUS | Status: AC
  Filled 2016-07-27: qty 50

## 2016-07-27 MED ORDER — DIPHENHYDRAMINE HCL 50 MG/ML IJ SOLN
25.0000 mg | Freq: Once | INTRAMUSCULAR | Status: AC
  Administered 2016-07-27: 25 mg via INTRAVENOUS

## 2016-07-27 MED ORDER — TRAMADOL HCL 50 MG PO TABS
ORAL_TABLET | ORAL | Status: AC
  Filled 2016-07-27: qty 1

## 2016-07-27 MED ORDER — TRAMADOL HCL 50 MG PO TABS
50.0000 mg | ORAL_TABLET | Freq: Once | ORAL | Status: AC
  Administered 2016-07-27: 50 mg via ORAL

## 2016-07-27 MED ORDER — TRAMADOL HCL 50 MG PO TABS: 50.0000 mg | ORAL_TABLET | Freq: Once | ORAL | Status: DC

## 2016-07-27 MED ORDER — SODIUM CHLORIDE 0.9% FLUSH
10.0000 mL | INTRAVENOUS | Status: DC | PRN
  Administered 2016-07-27: 10 mL via INTRAVENOUS
  Filled 2016-07-27: qty 10

## 2016-07-27 MED ORDER — PROCHLORPERAZINE MALEATE 5 MG PO TABS: 5.0000 mg | ORAL_TABLET | Freq: Four times a day (QID) | ORAL | 1 refills | Status: AC | PRN

## 2016-07-27 MED ORDER — DEXAMETHASONE SODIUM PHOSPHATE 10 MG/ML IJ SOLN
10.0000 mg | Freq: Once | INTRAMUSCULAR | Status: AC
  Administered 2016-07-27: 10 mg via INTRAVENOUS

## 2016-07-27 MED ORDER — TRAMADOL HCL 50 MG PO TABS: 50.0000 mg | ORAL_TABLET | Freq: Four times a day (QID) | ORAL | 0 refills | Status: DC | PRN

## 2016-07-27 MED ORDER — FAMOTIDINE IN NACL 20-0.9 MG/50ML-% IV SOLN
20.0000 mg | Freq: Once | INTRAVENOUS | Status: AC
  Administered 2016-07-27: 20 mg via INTRAVENOUS

## 2016-07-27 MED ORDER — HEPARIN SOD (PORK) LOCK FLUSH 100 UNIT/ML IV SOLN
500.0000 [IU] | Freq: Once | INTRAVENOUS | Status: AC | PRN
  Administered 2016-07-27: 500 [IU]
  Filled 2016-07-27: qty 5

## 2016-07-27 MED ORDER — DIPHENHYDRAMINE HCL 50 MG/ML IJ SOLN
INTRAMUSCULAR | Status: AC
  Filled 2016-07-27: qty 1

## 2016-07-27 MED ORDER — SODIUM CHLORIDE 0.9% FLUSH
10.0000 mL | INTRAVENOUS | Status: DC | PRN
  Administered 2016-07-27: 10 mL
  Filled 2016-07-27: qty 10

## 2016-07-27 MED ORDER — SODIUM CHLORIDE 0.9 % IV SOLN
Freq: Once | INTRAVENOUS | Status: AC
  Administered 2016-07-27: 10:00:00 via INTRAVENOUS

## 2016-07-27 MED ORDER — PACLITAXEL CHEMO INJECTION 300 MG/50ML
80.0000 mg/m2 | Freq: Once | INTRAVENOUS | Status: AC
  Administered 2016-07-27: 132 mg via INTRAVENOUS
  Filled 2016-07-27: qty 22

## 2016-07-27 NOTE — Progress Notes (Signed)
Met with patient and daughter to offer financial resources. He provided proof of income. Patient approved for one-time $400 [place]. Patient has a copy of the approval, expense sheet along with the Outpatient pharmacy information. Explained how the grant works. Also gave him a Medicaid application and [place] FAA for hardship settlement once he qualifies as far as balance. Patient has my card for any additional financial questions or concerns.

## 2016-07-27 NOTE — Progress Notes (Signed)
[facility] OFFICE PROGRESS NOTE   Diagnosis: Esophagus cancer  INTERVAL HISTORY:   Mr. Couey returns as scheduled. He completed a third cycle of weekly Taxol on 07/13/2016. He has mild peripheral numbness, this is not clearly progressed while on chemotherapy. This does not interfere with activities. Mr. Bostwick has no difficulty swallowing. He has mild discomfort at the left upper back. Mild pain associated with the right inguinal hernia. His activity is minimal. He continues to live alone. No falls.  Objective:  Vital signs in last 24 hours:  Blood pressure (!) 113/54, pulse 83, temperature 97.7 F (36.5 C), temperature source Oral, resp. rate 17, height '5\' 8"'$  (1.727 m), weight 130 lb 8 oz (59.2 kg), SpO2 100 %.    HEENT: No thrush Resp: Scattered bronchial sounds with an inspiratory/expiratory rub at the right lower posterior chest, no respiratory distress Cardio: Regular rate and rhythm GI: No hepatosplenomegaly, nontender Vascular: Pitting edema at the left greater than the right lower leg and ankle Neuro: Severe loss of vibratory sense at the fingertips bilaterally, sensation is intact to texture change at the fingertips bilaterally. Weakness with dorsiflexion of the right foot   Portacath/PICC-without erythema  Lab Results:  Lab Results  Component Value Date   WBC 7.7 07/27/2016   HGB 8.3 (L) 07/27/2016   HCT 25.0 (L) 07/27/2016   MCV 88.7 07/27/2016   PLT 380 07/27/2016   NEUTROABS 5.8 07/27/2016    CMP     Component Value Date/Time   NA 139 07/27/2016 0800   K 3.6 07/27/2016 0800   CL 101 03/18/2016 1007   CO2 26 07/27/2016 0800   GLUCOSE 111 07/27/2016 0800   BUN 16.6 07/27/2016 0800   CREATININE 0.7 07/27/2016 0800   CALCIUM 8.7 07/27/2016 0800   PROT 6.0 (L) 07/27/2016 0800   ALBUMIN 2.8 (L) 07/27/2016 0800   AST 13 07/27/2016 0800   ALT 10 07/27/2016 0800   ALKPHOS 82 07/27/2016 0800   BILITOT <0.22 07/27/2016 0800   GFRNONAA >60  03/18/2016 1007   GFRAA >60 03/18/2016 1007    Imaging: CT images from 07/21/2016-reviewed Medications: I have reviewed the patient's current medications.  Assessment/Plan: 1.Adenocarcinoma the esophagus  upper endoscopy 02/17/2016 confirmed a distal esophagus mass  CT chest 01/30/2016-increased in size of bilateral lung nodules  CT abdomen/pelvis 02/23/2016 distal esophagus mass, gastrohepatic adenopathy, iliac and probable thoracic and lumbar metastases, 1.7 cm aortocaval node, nonspecific lung nodules  Biopsy of a left iliac lesion 03/03/2016 confirmed metastatic adenocarcinoma, TTF-1, cytokeratin 7, cytokeratin 20, and CDX-2positive; morphology similar to the distal esophagus biopsy  Cycle 1 FOLFOX 03/24/2016  Cycle 2 FOLFOX 04/06/2016  Restaging CTs 04/27/2016-slight interval progression of pulmonary metastatic disease; progressive osseous metastatic disease. Enlarging left femoral greater trochanter lesion could be at risk for pathologic fracture.  Weekly Taxol initiated 05/04/2016  Restaging CTs 07/21/2016-unchanged pulmonary nodules, one right upper lobe nodule has decreased in size, stable gastrohepatic node, increase in soft tissue component at the left iliac and L2 metastases  Cycle 4 weekly Taxol initiated 07/27/2016  2. 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 Prostate cancer treated with external beam radiation  4. COPD  5. Coronary artery disease  6. 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 03/18/2016 Port-A-Cath placement  9. Palliative radiation to the left scapula and left proximal femur 05/03/2016-05/07/2016  10. Diarrhea-etiology unclear  11. History of  Hypokalemia secondary to diarrhea  12. History ofRight footdrop-likely secondary to weight loss and peripheral  nerve compression, persistent     Disposition:  Mr. Schramm appears unchanged. His overall performance status has improved since beginning Taxol. I reviewed the CT images with Mr. Schleifer and his family. He understands no therapy will be curative. We discussed observation versus continuing weekly Taxol. The plan is to complete another cycle of weekly Taxol beginning today. We will follow the neuropathy symptoms closely. The neuropathy does not appear to be related to Taxol.  Mr. Reffner will return for chemotherapy on 08/03/2016 and 08/10/2016. He will be scheduled for an office visit and chemotherapy on 08/24/2016.  25 minutes were spent with the patient today. The majority of the time was used for counseling and coordination of care.  [name], MD  07/27/2016  9:43 AM

## 2016-07-27 NOTE — Patient Instructions (Signed)
Implanted Port Home Guide An implanted port is a type of central line that is placed under the skin. Central lines are used to provide IV access when treatment or nutrition needs to be given through a person's veins. Implanted ports are used for long-term IV access. An implanted port may be placed because:  You need IV medicine that would be irritating to the small veins in your hands or arms.  You need long-term IV medicines, such as antibiotics.  You need IV nutrition for a long period.  You need frequent blood draws for lab tests.  You need dialysis.  Implanted ports are usually placed in the chest area, but they can also be placed in the upper arm, the abdomen, or the leg. An implanted port has two main parts:  Reservoir. The reservoir is round and will appear as a small, raised area under your skin. The reservoir is the part where a needle is inserted to give medicines or draw blood.  Catheter. The catheter is a thin, flexible tube that extends from the reservoir. The catheter is placed into a large vein. Medicine that is inserted into the reservoir goes into the catheter and then into the vein.  How will I care for my incision site? Do not get the incision site wet. Bathe or shower as directed by your health care provider. How is my port accessed? Special steps must be taken to access the port:  Before the port is accessed, a numbing cream can be placed on the skin. This helps numb the skin over the port site.  Your health care provider uses a sterile technique to access the port. ? Your health care provider must put on a mask and sterile gloves. ? The skin over your port is cleaned carefully with an antiseptic and allowed to dry. ? The port is gently pinched between sterile gloves, and a needle is inserted into the port.  Only "non-coring" port needles should be used to access the port. Once the port is accessed, a blood return should be checked. This helps ensure that the port  is in the vein and is not clogged.  If your port needs to remain accessed for a constant infusion, a clear (transparent) bandage will be placed over the needle site. The bandage and needle will need to be changed every week, or as directed by your health care provider.  Keep the bandage covering the needle clean and dry. Do not get it wet. Follow your health care provider's instructions on how to take a shower or bath while the port is accessed.  If your port does not need to stay accessed, no bandage is needed over the port.  What is flushing? Flushing helps keep the port from getting clogged. Follow your health care provider's instructions on how and when to flush the port. Ports are usually flushed with saline solution or a medicine called heparin. The need for flushing will depend on how the port is used.  If the port is used for intermittent medicines or blood draws, the port will need to be flushed: ? After medicines have been given. ? After blood has been drawn. ? As part of routine maintenance.  If a constant infusion is running, the port may not need to be flushed.  How long will my port stay implanted? The port can stay in for as long as your health care provider thinks it is needed. When it is time for the port to come out, surgery will be   done to remove it. The procedure is similar to the one performed when the port was put in. When should I seek immediate medical care? When you have an implanted port, you should seek immediate medical care if:  You notice a bad smell coming from the incision site.  You have swelling, redness, or drainage at the incision site.  You have more swelling or pain at the port site or the surrounding area.  You have a fever that is not controlled with medicine.  This information is not intended to replace advice given to you by your health care provider. Make sure you discuss any questions you have with your health care provider. Document  Released: 03/08/2005 Document Revised: 08/14/2015 Document Reviewed: 11/13/2012 Elsevier Interactive Patient Education  2017 [employer]vier Inc.  

## 2016-07-27 NOTE — Patient Instructions (Signed)
[facility] Discharge Instructions for Patients Receiving Chemotherapy  Today you received the following chemotherapy agents paclitaxel (Taxol).  To help prevent nausea and vomiting after your treatment, we encourage you to take your nausea medication as prescribed.   If you develop nausea and vomiting that is not controlled by your nausea medication, call the clinic.   BELOW ARE SYMPTOMS THAT SHOULD BE REPORTED IMMEDIATELY:  *FEVER GREATER THAN 100.5 F  *CHILLS WITH OR WITHOUT FEVER  NAUSEA AND VOMITING THAT IS NOT CONTROLLED WITH YOUR NAUSEA MEDICATION  *UNUSUAL SHORTNESS OF BREATH  *UNUSUAL BRUISING OR BLEEDING  TENDERNESS IN MOUTH AND THROAT WITH OR WITHOUT PRESENCE OF ULCERS  *URINARY PROBLEMS  *BOWEL PROBLEMS  UNUSUAL RASH Items with * indicate a potential emergency and should be followed up as soon as possible.  Feel free to call the clinic you have any questions or concerns. The clinic phone number is [phone].  Please show the [facility] at check-in to the Emergency Department and triage nurse.

## 2016-07-29 ENCOUNTER — Other Ambulatory Visit: Payer: Self-pay | Admitting: *Deleted

## 2016-07-29 ENCOUNTER — Telehealth: Payer: Self-pay | Admitting: Oncology

## 2016-07-29 DIAGNOSIS — C155 Malignant neoplasm of lower third of esophagus: Secondary | ICD-10-CM

## 2016-07-29 MED ORDER — TRAMADOL HCL 50 MG PO TABS: 50.0000 mg | ORAL_TABLET | Freq: Four times a day (QID) | ORAL | 0 refills | Status: DC | PRN

## 2016-07-29 NOTE — Telephone Encounter (Signed)
Patient bypassed scheduling on 07/27/16.  Appointments scheduled per 07/27/16 los. Patient will pick up copy of appointment schedule on next visit.

## 2016-07-30 ENCOUNTER — Encounter: Payer: Self-pay | Admitting: Cardiovascular Disease

## 2016-07-30 ENCOUNTER — Ambulatory Visit (INDEPENDENT_AMBULATORY_CARE_PROVIDER_SITE_OTHER): Payer: Medicare Other | Admitting: Cardiovascular Disease

## 2016-07-30 VITALS — BP 118/64 | HR 104 | Ht 68.0 in | Wt 126.4 lb

## 2016-07-30 DIAGNOSIS — F17200 Nicotine dependence, unspecified, uncomplicated: Secondary | ICD-10-CM | POA: Diagnosis not present

## 2016-07-30 DIAGNOSIS — E7800 Pure hypercholesterolemia, unspecified: Secondary | ICD-10-CM

## 2016-07-30 DIAGNOSIS — I4892 Unspecified atrial flutter: Secondary | ICD-10-CM

## 2016-07-30 DIAGNOSIS — I25811 Atherosclerosis of native coronary artery of transplanted heart without angina pectoris: Secondary | ICD-10-CM | POA: Diagnosis not present

## 2016-07-30 DIAGNOSIS — I6523 Occlusion and stenosis of bilateral carotid arteries: Secondary | ICD-10-CM

## 2016-07-30 NOTE — Patient Instructions (Signed)

## 2016-07-30 NOTE — Progress Notes (Signed)
Chief Complaint  Patient presents with  . Follow-up    CAD     History of Present Illness: 76 yo male with history of CAD, COPD, HTN, HLD, GERD, PAD, carotid artery disease and atrial flutter who is here today for cardiac follow up. His cardiac history is significant for anterior MI in 1997 treated with a bare metal stent x 2 in the LAD, bare metal stent x 1 in the Circumflex in 2002 and PCI in 2009 with placement of 2 separate drug eluting stents in the LAD, DES placement in the distal AV groove Circumflex in 2012 and DES in the OM in 2012. His carotid artery dopplers 12/02/12 in [facility] office showed mild disease in RICA at CEA site and mild disease in the LICA CES site. He is followed by Dr. Lamonte Sakai for COPD. He stopped Losartan 2014 due to dizziness and hypotension. He had c/o chest pain at his visit here in March 2015. I arranged a stress test but he cancelled this test. He was seen in follow up 12/10/13 and his chest pain had resolved. 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 Admitted to [facility] 11/01/15 with tachycardia and found to have atrial flutter. He was was anti-coagulated on Eliquis and Cardizem was started. He underwent TEE guided DCCV. He was admitted to [facility] 11/20/15 with a TIA while on Eliquis. He took Eliqius for two weeks but then refused to continue and restarted ASA/Plavix, knowing this was against medical advice and not optimal for CVA prevention with atrial flutter. Ad[facility] to [facility] 01/23/16 with syncope. He was felt to be orthostatic and was hydrated. His beta blocker and Cardizem have been held. Echo 01/24/16 with LVEF=60-65%. Moderate TR, RVSP 57 mmHg.   He is here today for follow up. The patient denies any chest pain, dyspnea, palpitations, lower extremity edema, orthopnea, PND, dizziness, near syncope or syncope.    Primary Care Physician: Tower, Wynelle Fanny, MD   Past Medical History:  Diagnosis Date  . AAA (abdominal aortic aneurysm)  (HCC)    4.2 cm IR AAA noted on 01/13/15 PET scan  . Anxiety   . CAD (coronary artery disease)    AMI 1997 w/ BMS x 2 LAD, BMS CFX 2002, DES x 2 LAD 2009, DES CFX & OM 2012   . Carotid artery disease ([place])    RICA CEA 1610, LICA CEA 9604 per Dr. [name],   . COPD (chronic obstructive pulmonary disease) ([place])   . GERD (gastroesophageal reflux disease)    occ  . HTN (hypertension)   . Hydradenitis   . Hyperlipidemia   . Ischemic cardiomyopathy    EF 40-45 percent at cath 2012  . Myocardial infarction ([place])   . Non-small cell lung cancer (NSCLC) ([place]) dx'd 12/2014   SBRT  . Osteoarthritis    pt. denies 01/17/2015  . PAC (premature atrial contraction)   . Peripheral vascular disease ([place])   . Pneumonia   . Prostate cancer ([place]) dx'd approx 2008   xrt throughfoley  . Radiation 02/18/15-02/25/15   right upper lung 54 Gy  . Shortness of breath dyspnea   . Stroke [facility]) 11-24-11   "no feeling in right 5th digit"  . Urinary frequency   . Urinary urgency     Past Surgical History:  Procedure Laterality Date  . CARDIAC CATHETERIZATION    . CARDIOVASCULAR STRESS TEST  12-2006  . CARDIOVERSION N/A 10/31/2015   Procedure: CARDIOVERSION;  Surgeon: Eber Hong  Radford Pax, MD;  Location: [facility];  Service: Cardiovascular;  Laterality: N/A;  . CAROTID ENDARTERECTOMY Right 03-2000   CE  . CAROTID ENDARTERECTOMY Left 11-24-11   CE  . COLONOSCOPY  04-2001   polyp  . [place], 2002, 2009, 2012   8 stents total  . [place]   neg  . ENDARTERECTOMY  11/24/2011   Procedure: ENDARTERECTOMY CAROTID;  Surgeon: Rosetta Posner, MD;  Location: [facility];  Service: Vascular;  Laterality: Left;  . EYE SURGERY Bilateral Aug. 17, 2015   Cataract  . FUDUCIAL PLACEMENT N/A 12/25/2014   Procedure: PLACEMENT OF FUDUCIAL;  Surgeon: Collene Gobble, MD;  Location: [facility];  Service: Thoracic;  Laterality: N/A;  . IR GENERIC HISTORICAL  03/18/2016   IR US GUIDE VASC ACCESS LEFT  03/18/2016 Jacqulynn Cadet, MD [facility]-INTERV RAD  . IR GENERIC HISTORICAL  03/18/2016   IR FLUORO GUIDE PORT INSERTION LEFT 03/18/2016 Jacqulynn Cadet, MD [facility]-INTER[facility]  . PROSTATE SURGERY     Radiation Tx  X's 40  . TEE WITHOUT CARDIOVERSION N/A 10/31/2015   Procedure: TRANSESOPHAGEAL ECHOCARDIOGRAM (TEE);  Surgeon: Sueanne Margarita, MD;  Location: [facility] ENDOSCOPY;  Service: Cardiovascular;  Laterality: N/A;  . VASCULAR SURGERY    . VIDEO BRONCHOSCOPY WITH ENDOBRONCHIAL NAVIGATION N/A 12/25/2014   Procedure: VIDEO BRONCHOSCOPY WITH ENDOBRONCHIAL NAVIGATION  with Biopsies and Brushings;  Surgeon: Collene Gobble, MD;  Location: [facility];  Service: Thoracic;  Laterality: N/A;  . VIDEO BRONCHOSCOPY WITH ENDOBRONCHIAL ULTRASOUND N/A 01/22/2015   Procedure: VIDEO BRONCHOSCOPY WITH ENDOBRONCHIAL ULTRASOUND with Biopsies;  Surgeon: Collene Gobble, MD;  Location: [facility] OR;  Service: Thoracic;  Laterality: N/A;    Current Outpatient Prescriptions  Medication Sig Dispense Refill  . acetaminophen (TYLENOL) 500 MG tablet Take 1,000 mg by mouth every 4 (four) hours as needed for mild pain, moderate pain, fever or headache.     . ALPRAZolam (XANAX) 0.5 MG tablet Take 0.5-1 tablets (0.25-0.5 mg total) by mouth daily as needed for anxiety or sleep. 30 tablet 0  . clopidogrel (PLAVIX) 75 MG tablet TAKE 1 TABLET BY MOUTH EVERY DAY 30 tablet 7  . diphenoxylate-atropine (LOMOTIL) 2.5-0.025 MG tablet Take 1-2 tablets by mouth 4 (four) times daily as needed for diarrhea or loose stools. 60 tablet 0  . fludrocortisone (FLORINEF) 0.1 MG tablet Take 1 tablet (0.1 mg total) by mouth daily. 30 tablet 5  . fluticasone-salmeterol (ADVAIR HFA) 115-21 MCG/ACT inhaler Inhale 2 puffs into the lungs 2 (two) times daily.    Marland Kitchen ipratropium-albuterol (DUONEB) 0.5-2.5 (3) MG/3ML SOLN Take 3 mLs by nebulization every 4 (four) hours as needed (shortness of breath and wheezing).    Marland Kitchen lidocaine (XYLOCAINE) 2 % solution Use as directed 20 mLs in the  mouth or throat as needed for mouth pain.    Marland Kitchen lidocaine-prilocaine (EMLA) cream Apply to port site one hour prior to use. Do not rub in. Cover with plastic. 30 g 2  . loratadine (CLARITIN) 10 MG tablet TAKE 1 TABLET BY MOUTH EVERY DAY 30 tablet 5  . mirtazapine (REMERON) 15 MG tablet TAKE 1 TABLET (15 MG TOTAL) BY MOUTH AT BEDTIME. 30 tablet 6  . Multiple Vitamin (MULTIVITAMIN WITH MINERALS) TABS tablet Take 1 tablet by mouth daily.    . nitroGLYCERIN (NITROSTAT) 0.4 MG SL tablet Place 0.4 mg under the tongue every 5 (five) minutes as needed for chest pain.    Marland Kitchen omeprazole (PRILOSEC) 40 MG capsule TAKE  1 CAPSULE 30 MINUTES BEFORE BREAKFAST AND DINNER 60 capsule 3  . potassium chloride 20 MEQ/15ML (10%) SOLN Take 15 mLs (20 mEq total) by mouth daily. 450 mL 0  . predniSONE (DELTASONE) 20 MG tablet Take 0.5 tablets (10 mg total) by mouth daily with breakfast. To stimulate appetite  2  . prochlorperazine (COMPAZINE) 5 MG tablet Take 1 tablet (5 mg total) by mouth every 6 (six) hours as needed for nausea or vomiting. 30 tablet 1  . SPIRIVA HANDIHALER 18 MCG inhalation capsule INHALE 1 CAPSULE VIA HANDIHALER ONCE DAILY AT THE SAME TIME EVERY DAY 30 capsule 5  . tiotropium (SPIRIVA) 18 MCG inhalation capsule Place 1 capsule (18 mcg total) into inhaler and inhale daily. 30 capsule 5  . traMADol (ULTRAM) 50 MG tablet Take 1 tablet (50 mg total) by mouth every 6 (six) hours as needed. 50 tablet 0  . VENTOLIN HFA 108 (90 Base) MCG/ACT inhaler INHALE 2 PUFFS INTO THE LUNGS EVERY 4 (FOUR) HOURS AS NEEDED FOR WHEEZING OR SHORTNESS OF BREATH. 18 g 5   No current facility-administered medications for this visit.     Allergies  Allergen Reactions  . Augmentin [Amoxicillin-Pot Clavulanate] Diarrhea, Nausea And Vomiting and Other (See Comments)    Has patient had a PCN reaction causing immediate rash, facial/tongue/throat swelling, SOB or lightheadedness with hypotension: No Has patient had a PCN reaction  causing severe rash involving mucus membranes or skin necrosis: No Has patient had a PCN reaction that required hospitalization No Has patient had a PCN reaction occurring within the last 10 years: Yes If all of the above answers are "NO", then may proceed with Cephalosporin use.   . Chantix [Varenicline] Other (See Comments)    Violent nightmares  . Sulfonamide Derivatives Other (See Comments)    Pt do not remember 8-30 md  . Zantac [Ranitidine Hcl] Diarrhea    Social History   Social History  . Marital status: Widowed    Spouse name: N/A  . Number of children: 5  . Years of education: N/A   Occupational History  . Retired Airline pilot   . Part time, delivering auto parts [employer]   Social History Main Topics  . Smoking status: Current Every Day Smoker    Packs/day: 0.50    Years: 55.00    Types: Cigarettes  . Smokeless tobacco: Never Used     Comment: 1/2 ppd (01/07/15)  . Alcohol use 0.0 oz/week     Comment: occ  . Drug use: No  . Sexual activity: Not on file   Other Topics Concern  . Not on file   Social History Narrative   Widowed, lives next door to his step-daughter, Antony Haste   Independent in ADLs and drives   Prior worked delivering car parts    Family History  Problem Relation Age of Onset  . Stroke Father   . Heart disease Father   . Heart disease Mother        pacemaker  . Other Brother        benign brain tumor  . Cancer Brother        Eye-behind  . Cancer Sister        Breast  . Liver cancer Brother   . Diabetes Maternal Grandmother   . Colon polyps Neg Hx   . Esophageal cancer Neg Hx     Review of Systems:  As stated in the HPI and otherwise negative.   BP 118/64   Pulse Marland Kitchen)  104   Ht '5\' 8"'$  (1.727 m)   Wt 126 lb 6.4 oz (57.3 kg)   BMI 19.22 kg/m   Physical Examination:  General: Well developed, well nourished, NAD  HEENT: OP clear, mucus membranes moist  SKIN: warm, dry. No rashes. Neuro: No focal deficits    Musculoskeletal: Muscle strength 5/5 all ext  Psychiatric: Mood and affect normal  Neck: No JVD, no carotid bruits, no thyromegaly, no lymphadenopathy.  Lungs:Clear bilaterally, no wheezes, rhonci, crackles Cardiovascular: Regular rate and rhythm. No murmurs, gallops or rubs. Abdomen:Soft. Bowel sounds present. Non-tender.  Extremities: No lower extremity edema. Pulses are 2 + in the bilateral DP/PT.  Echo 01/24/16: Left ventricle: The cavity size was normal. There was mild   concentric hypertrophy. Systolic function was normal. The   estimated ejection fraction was in the range of 60% to 65%. Wall   motion was normal; there were no regional wall motion   abnormalities. The study is not technically sufficient to allow   evaluation of LV diastolic function. - Left atrium: The atrium was normal in size. - Right ventricle: Systolic function was normal. - Tricuspid valve: There was mild-moderate regurgitation. - Pulmonary arteries: Systolic pressure was moderately elevated. PA   peak pressure: 57 mm Hg (S).  EKG:  EKG is not   ordered today. The ekg ordered today demonstrates   Recent Labs: 10/30/2015: TSH 5.114 07/27/2016: ALT 10; BUN 16.6; Creatinine 0.7; HGB 8.3; Platelets 380; Potassium 3.6; Sodium 139   Lipid Panel    Component Value Date/Time   CHOL 116 11/20/2015 0630   TRIG 65 11/20/2015 0630   HDL 51 11/20/2015 0630   CHOLHDL 2.3 11/20/2015 0630   VLDL 13 11/20/2015 0630   LDLCALC 52 11/20/2015 0630     Wt Readings from Last 3 Encounters:  07/30/16 126 lb 6.4 oz (57.3 kg)  07/27/16 130 lb 8 oz (59.2 kg)  07/13/16 128 lb 3.2 oz (58.2 kg)     Other studies Reviewed: Additional studies/ records that were reviewed today include: . Review of the above records demonstrates:    Assessment and Plan:   1. CAD without angina:  He has no chest pain suggestive of angina. Will continue Plavix given his many stents. No beta blocker due to intolerance (hypotension). He does  not wish to restart a statin.  We tried changing to Protonix with Plavix use but he did not tolerate so he went back to Prilosec.     2. Carotid artery disease: Stable s/p bilateral CEA. Followed by Dr. [name] in [facility].   3. Tobacco abuse: Still smoking with active lung disease, now on chemo. Smoking cessation encourage, counseling given today. He does not wish to stop.   4. Hyperlipidemia: He refuses statin therapy and stopped Zetia.    5. Paroxysmal atrial fib/flutter: He appears to be in sinus today. He refuses anti-coagulation. No beta blockers or Calcium channel blockers due to hypotension.    Current medicines are reviewed at length with the patient today.  The patient does not have concerns regarding medicines.  The following changes have been made:  no change  Labs/ tests ordered today include:  No orders of the defined types were placed in this encounter.   Disposition:   FU with me in 12  months  Signed, Lauree Chandler, MD 07/30/2016 4:57 PM    [facility] [address], [place], [place]  [address] Phone: [phone]; Fax: [phone]

## 2016-08-01 ENCOUNTER — Other Ambulatory Visit: Payer: Self-pay | Admitting: Oncology

## 2016-08-03 ENCOUNTER — Telehealth: Payer: Self-pay | Admitting: *Deleted

## 2016-08-03 ENCOUNTER — Other Ambulatory Visit: Payer: Medicare Other

## 2016-08-03 ENCOUNTER — Telehealth: Payer: Self-pay | Admitting: Oncology

## 2016-08-03 ENCOUNTER — Ambulatory Visit: Payer: Medicare Other

## 2016-08-03 NOTE — Telephone Encounter (Signed)
Patient's daughter called and said that he was not feeling well and did not have a good day yesterday (5/14) and would not be getting treatment today, so they needed to cancel his appointments

## 2016-08-03 NOTE — Telephone Encounter (Addendum)
Message received from operator that patient's daughter called this AM to cancel patient's appt today d/t he had a "bad day yesterday."  Call placed to patient's daughter to check on patient's status.  She states that patient has had weakness with no appetite since chemo treatment last week and that she is currently at patient's house where pt is asleep at this time.  Instructed patient's daughter to call back to [facility] if when pt awakens he is still unable to eat and is feeling bad.  Patient's daughter verbalized an understanding of instructions.

## 2016-08-04 ENCOUNTER — Other Ambulatory Visit: Payer: Self-pay

## 2016-08-04 ENCOUNTER — Emergency Department: Payer: Medicare Other

## 2016-08-04 ENCOUNTER — Inpatient Hospital Stay
Admission: EM | Admit: 2016-08-04 | Discharge: 2016-08-05 | DRG: 190 | Disposition: A | Discharge status: Home / Self Care | Payer: Medicare Other | Attending: Internal Medicine | Admitting: Internal Medicine

## 2016-08-04 DIAGNOSIS — I714 Abdominal aortic aneurysm, without rupture: Secondary | ICD-10-CM | POA: Diagnosis present

## 2016-08-04 DIAGNOSIS — I739 Peripheral vascular disease, unspecified: Secondary | ICD-10-CM | POA: Diagnosis not present

## 2016-08-04 DIAGNOSIS — Z955 Presence of coronary angioplasty implant and graft: Secondary | ICD-10-CM | POA: Diagnosis not present

## 2016-08-04 DIAGNOSIS — F17210 Nicotine dependence, cigarettes, uncomplicated: Secondary | ICD-10-CM | POA: Diagnosis present

## 2016-08-04 DIAGNOSIS — R1319 Other dysphagia: Secondary | ICD-10-CM

## 2016-08-04 DIAGNOSIS — J9311 Primary spontaneous pneumothorax: Secondary | ICD-10-CM

## 2016-08-04 DIAGNOSIS — J9601 Acute respiratory failure with hypoxia: Secondary | ICD-10-CM | POA: Diagnosis present

## 2016-08-04 DIAGNOSIS — Z880 Allergy status to penicillin: Secondary | ICD-10-CM

## 2016-08-04 DIAGNOSIS — I255 Ischemic cardiomyopathy: Secondary | ICD-10-CM | POA: Diagnosis not present

## 2016-08-04 DIAGNOSIS — Z8673 Personal history of transient ischemic attack (TIA), and cerebral infarction without residual deficits: Secondary | ICD-10-CM | POA: Diagnosis not present

## 2016-08-04 DIAGNOSIS — E785 Hyperlipidemia, unspecified: Secondary | ICD-10-CM | POA: Diagnosis not present

## 2016-08-04 DIAGNOSIS — I252 Old myocardial infarction: Secondary | ICD-10-CM | POA: Diagnosis not present

## 2016-08-04 DIAGNOSIS — Y95 Nosocomial condition: Secondary | ICD-10-CM | POA: Diagnosis present

## 2016-08-04 DIAGNOSIS — R0602 Shortness of breath: Secondary | ICD-10-CM | POA: Diagnosis not present

## 2016-08-04 DIAGNOSIS — Z881 Allergy status to other antibiotic agents status: Secondary | ICD-10-CM | POA: Diagnosis not present

## 2016-08-04 DIAGNOSIS — C159 Malignant neoplasm of esophagus, unspecified: Secondary | ICD-10-CM | POA: Diagnosis not present

## 2016-08-04 DIAGNOSIS — I2510 Atherosclerotic heart disease of native coronary artery without angina pectoris: Secondary | ICD-10-CM | POA: Diagnosis not present

## 2016-08-04 DIAGNOSIS — Z8546 Personal history of malignant neoplasm of prostate: Secondary | ICD-10-CM

## 2016-08-04 DIAGNOSIS — Z85118 Personal history of other malignant neoplasm of bronchus and lung: Secondary | ICD-10-CM

## 2016-08-04 DIAGNOSIS — Z8501 Personal history of malignant neoplasm of esophagus: Secondary | ICD-10-CM

## 2016-08-04 DIAGNOSIS — F419 Anxiety disorder, unspecified: Secondary | ICD-10-CM | POA: Diagnosis present

## 2016-08-04 DIAGNOSIS — K219 Gastro-esophageal reflux disease without esophagitis: Secondary | ICD-10-CM | POA: Diagnosis not present

## 2016-08-04 DIAGNOSIS — I10 Essential (primary) hypertension: Secondary | ICD-10-CM | POA: Diagnosis present

## 2016-08-04 DIAGNOSIS — J189 Pneumonia, unspecified organism: Secondary | ICD-10-CM | POA: Diagnosis present

## 2016-08-04 DIAGNOSIS — J939 Pneumothorax, unspecified: Secondary | ICD-10-CM

## 2016-08-04 DIAGNOSIS — R1310 Dysphagia, unspecified: Secondary | ICD-10-CM

## 2016-08-04 DIAGNOSIS — R079 Chest pain, unspecified: Secondary | ICD-10-CM | POA: Diagnosis not present

## 2016-08-04 DIAGNOSIS — Z882 Allergy status to sulfonamides status: Secondary | ICD-10-CM

## 2016-08-04 DIAGNOSIS — J441 Chronic obstructive pulmonary disease with (acute) exacerbation: Secondary | ICD-10-CM | POA: Diagnosis not present

## 2016-08-04 DIAGNOSIS — F329 Major depressive disorder, single episode, unspecified: Secondary | ICD-10-CM | POA: Diagnosis present

## 2016-08-04 DIAGNOSIS — Z888 Allergy status to other drugs, medicaments and biological substances status: Secondary | ICD-10-CM

## 2016-08-04 DIAGNOSIS — C155 Malignant neoplasm of lower third of esophagus: Secondary | ICD-10-CM

## 2016-08-04 DIAGNOSIS — J9383 Other pneumothorax: Secondary | ICD-10-CM | POA: Diagnosis not present

## 2016-08-04 DIAGNOSIS — R933 Abnormal findings on diagnostic imaging of other parts of digestive tract: Secondary | ICD-10-CM

## 2016-08-04 DIAGNOSIS — J440 Chronic obstructive pulmonary disease with acute lower respiratory infection: Secondary | ICD-10-CM | POA: Diagnosis not present

## 2016-08-04 DIAGNOSIS — Z7952 Long term (current) use of systemic steroids: Secondary | ICD-10-CM

## 2016-08-04 DIAGNOSIS — Z923 Personal history of irradiation: Secondary | ICD-10-CM

## 2016-08-04 LAB — CBC WITH DIFFERENTIAL/PLATELET
Basophils Absolute: 0 10*3/uL (ref 0–0.1)
Basophils Relative: 0 %
Eosinophils Absolute: 0 10*3/uL (ref 0–0.7)
Eosinophils Relative: 0 %
HEMATOCRIT: 26.9 % — AB (ref 40.0–52.0)
HEMOGLOBIN: 8.8 g/dL — AB (ref 13.0–18.0)
LYMPHS ABS: 0.6 10*3/uL — AB (ref 1.0–3.6)
LYMPHS PCT: 6 %
MCH: 28.9 pg (ref 26.0–34.0)
MCHC: 32.8 g/dL (ref 32.0–36.0)
MCV: 88 fL (ref 80.0–100.0)
MONOS PCT: 6 %
Monocytes Absolute: 0.6 10*3/uL (ref 0.2–1.0)
NEUTROS ABS: 8.4 10*3/uL — AB (ref 1.4–6.5)
NEUTROS PCT: 88 %
Platelets: 436 10*3/uL (ref 150–440)
RBC: 3.05 MIL/uL — ABNORMAL LOW (ref 4.40–5.90)
RDW: 22.6 % — ABNORMAL HIGH (ref 11.5–14.5)
WBC: 9.7 10*3/uL (ref 3.8–10.6)

## 2016-08-04 LAB — BASIC METABOLIC PANEL
Anion gap: 9 (ref 5–15)
BUN: 14 mg/dL (ref 6–20)
CHLORIDE: 101 mmol/L (ref 101–111)
CO2: 25 mmol/L (ref 22–32)
CREATININE: 0.84 mg/dL (ref 0.61–1.24)
Calcium: 8.7 mg/dL — ABNORMAL LOW (ref 8.9–10.3)
GFR calc Af Amer: >60 mL/min (ref >60)
GFR calc non Af Amer: >60 mL/min (ref >60)
Glucose, Bld: 139 mg/dL — ABNORMAL HIGH (ref 65–99)
Potassium: 3.7 mmol/L (ref 3.5–5.1)
Sodium: 135 mmol/L (ref 135–145)

## 2016-08-04 LAB — TROPONIN I: Troponin I: <0.03 ng/mL (ref <0.03)

## 2016-08-04 MED ORDER — IOPAMIDOL (ISOVUE-370) INJECTION 76%
75.0000 mL | Freq: Once | INTRAVENOUS | Status: AC | PRN
  Administered 2016-08-04: 75 mL via INTRAVENOUS

## 2016-08-04 MED ORDER — IPRATROPIUM-ALBUTEROL 0.5-2.5 (3) MG/3ML IN SOLN
3.0000 mL | Freq: Once | RESPIRATORY_TRACT | Status: AC
  Administered 2016-08-04: 3 mL via RESPIRATORY_TRACT
  Filled 2016-08-04: qty 3

## 2016-08-04 MED ORDER — TRAMADOL HCL 50 MG PO TABS
50.0000 mg | ORAL_TABLET | Freq: Four times a day (QID) | ORAL | Status: DC | PRN
  Administered 2016-08-05: 50 mg via ORAL
  Filled 2016-08-04: qty 1

## 2016-08-04 MED ORDER — HYDROCOD POLST-CPM POLST ER 10-8 MG/5ML PO SUER
5.0000 mL | Freq: Two times a day (BID) | ORAL | Status: DC
  Administered 2016-08-04 – 2016-08-05 (×2): 5 mL via ORAL
  Filled 2016-08-04 (×2): qty 5

## 2016-08-04 MED ORDER — LORATADINE 10 MG PO TABS
10.0000 mg | ORAL_TABLET | Freq: Every day | ORAL | Status: DC
  Administered 2016-08-05: 09:00:00 10 mg via ORAL
  Filled 2016-08-04: qty 1

## 2016-08-04 MED ORDER — CEFEPIME-DEXTROSE 1 GM/50ML IV SOLR
1.0000 g | Freq: Once | INTRAVENOUS | Status: AC
  Administered 2016-08-04: 1 g via INTRAVENOUS
  Filled 2016-08-04: qty 50

## 2016-08-04 MED ORDER — SODIUM CHLORIDE 0.9 % IV BOLUS (SEPSIS)
500.0000 mL | Freq: Once | INTRAVENOUS | Status: AC
  Administered 2016-08-04: 500 mL via INTRAVENOUS

## 2016-08-04 MED ORDER — AZITHROMYCIN 500 MG PO TABS
500.0000 mg | ORAL_TABLET | Freq: Once | ORAL | Status: AC
  Administered 2016-08-04: 500 mg via ORAL
  Filled 2016-08-04: qty 1

## 2016-08-04 MED ORDER — METHYLPREDNISOLONE SODIUM SUCC 40 MG IJ SOLR
40.0000 mg | Freq: Four times a day (QID) | INTRAMUSCULAR | Status: DC
  Administered 2016-08-04 – 2016-08-05 (×3): 40 mg via INTRAVENOUS
  Filled 2016-08-04 (×3): qty 1

## 2016-08-04 MED ORDER — OMEPRAZOLE 40 MG PO CPDR: DELAYED_RELEASE_CAPSULE | ORAL | 1 refills | Status: AC

## 2016-08-04 MED ORDER — ACETAMINOPHEN 650 MG RE SUPP: 650.0000 mg | Freq: Four times a day (QID) | RECTAL | Status: DC | PRN

## 2016-08-04 MED ORDER — ONDANSETRON HCL 4 MG/2ML IJ SOLN: 4.0000 mg | Freq: Four times a day (QID) | INTRAMUSCULAR | Status: DC | PRN

## 2016-08-04 MED ORDER — BUDESONIDE 0.5 MG/2ML IN SUSP
0.5000 mg | Freq: Two times a day (BID) | RESPIRATORY_TRACT | Status: DC
  Administered 2016-08-04 – 2016-08-05 (×2): 0.5 mg via RESPIRATORY_TRACT
  Filled 2016-08-04 (×2): qty 2

## 2016-08-04 MED ORDER — CEFTRIAXONE SODIUM 1 G IJ SOLR
1.0000 g | INTRAMUSCULAR | Status: DC
  Administered 2016-08-04: 1 g via INTRAVENOUS
  Filled 2016-08-04 (×2): qty 10

## 2016-08-04 MED ORDER — FLUDROCORTISONE ACETATE 0.1 MG PO TABS
0.1000 mg | ORAL_TABLET | Freq: Every day | ORAL | Status: DC
  Administered 2016-08-05: 0.1 mg via ORAL
  Filled 2016-08-04: qty 1

## 2016-08-04 MED ORDER — VANCOMYCIN HCL IN DEXTROSE 1-5 GM/200ML-% IV SOLN
1000.0000 mg | Freq: Once | INTRAVENOUS | Status: AC
  Administered 2016-08-04: 1000 mg via INTRAVENOUS
  Filled 2016-08-04: qty 200

## 2016-08-04 MED ORDER — ALPRAZOLAM 0.5 MG PO TABS: 0.2500 mg | ORAL_TABLET | Freq: Every day | ORAL | Status: DC | PRN

## 2016-08-04 MED ORDER — ENOXAPARIN SODIUM 40 MG/0.4ML ~~LOC~~ SOLN
40.0000 mg | SUBCUTANEOUS | Status: DC
  Administered 2016-08-04: 40 mg via SUBCUTANEOUS
  Filled 2016-08-04: qty 0.4

## 2016-08-04 MED ORDER — PANTOPRAZOLE SODIUM 40 MG PO TBEC
40.0000 mg | DELAYED_RELEASE_TABLET | Freq: Every day | ORAL | Status: DC
  Administered 2016-08-05: 40 mg via ORAL
  Filled 2016-08-04: qty 1

## 2016-08-04 MED ORDER — AZITHROMYCIN 500 MG PO TABS
250.0000 mg | ORAL_TABLET | Freq: Every day | ORAL | Status: DC
  Administered 2016-08-05: 250 mg via ORAL
  Filled 2016-08-04: qty 1

## 2016-08-04 MED ORDER — METHYLPREDNISOLONE SODIUM SUCC 125 MG IJ SOLR
125.0000 mg | Freq: Once | INTRAMUSCULAR | Status: AC
  Administered 2016-08-04: 125 mg via INTRAVENOUS
  Filled 2016-08-04: qty 2

## 2016-08-04 MED ORDER — CLOPIDOGREL BISULFATE 75 MG PO TABS
75.0000 mg | ORAL_TABLET | Freq: Every day | ORAL | Status: DC
  Administered 2016-08-05: 75 mg via ORAL
  Filled 2016-08-04: qty 1

## 2016-08-04 MED ORDER — GUAIFENESIN 100 MG/5ML PO SOLN
5.0000 mL | ORAL | Status: DC | PRN
  Administered 2016-08-05: 100 mg via ORAL
  Filled 2016-08-04: qty 10

## 2016-08-04 MED ORDER — ADULT MULTIVITAMIN W/MINERALS CH
1.0000 | ORAL_TABLET | Freq: Every day | ORAL | Status: DC
  Administered 2016-08-05: 09:00:00 1 via ORAL
  Filled 2016-08-04: qty 1

## 2016-08-04 MED ORDER — DIPHENOXYLATE-ATROPINE 2.5-0.025 MG PO TABS: 1.0000 | ORAL_TABLET | Freq: Four times a day (QID) | ORAL | Status: DC | PRN

## 2016-08-04 MED ORDER — PROCHLORPERAZINE MALEATE 5 MG PO TABS
5.0000 mg | ORAL_TABLET | Freq: Four times a day (QID) | ORAL | Status: DC | PRN
  Filled 2016-08-04: qty 1

## 2016-08-04 MED ORDER — IPRATROPIUM-ALBUTEROL 0.5-2.5 (3) MG/3ML IN SOLN
3.0000 mL | RESPIRATORY_TRACT | Status: DC
  Administered 2016-08-04 – 2016-08-05 (×5): 3 mL via RESPIRATORY_TRACT
  Filled 2016-08-04 (×5): qty 3

## 2016-08-04 MED ORDER — MIRTAZAPINE 15 MG PO TABS
15.0000 mg | ORAL_TABLET | Freq: Every day | ORAL | Status: DC
  Administered 2016-08-04: 22:00:00 15 mg via ORAL
  Filled 2016-08-04: qty 1

## 2016-08-04 MED ORDER — ACETAMINOPHEN 325 MG PO TABS: 650.0000 mg | ORAL_TABLET | Freq: Four times a day (QID) | ORAL | Status: DC | PRN

## 2016-08-04 MED ORDER — ONDANSETRON HCL 4 MG PO TABS: 4.0000 mg | ORAL_TABLET | Freq: Four times a day (QID) | ORAL | Status: DC | PRN

## 2016-08-04 MED ORDER — NITROGLYCERIN 0.4 MG SL SUBL: 0.4000 mg | SUBLINGUAL_TABLET | SUBLINGUAL | Status: DC | PRN

## 2016-08-04 NOTE — ED Notes (Signed)
Patient has portacath.  Requests this be used to obtain blood and IV access.  Bloods not obtained in triage.

## 2016-08-04 NOTE — ED Triage Notes (Signed)
C/O dizziness that started yesterday, dizziness currently resolved.  Today c/o left sided chest pain, with cough and deep breathing, and SOB.

## 2016-08-04 NOTE — H&P (Signed)
[facility] NAME: [name]    MR#:  [id number]  DATE OF BIRTH:  24-Jan-1941  DATE OF ADMISSION:  08/04/2016  PRIMARY CARE PHYSICIAN: Tower, Wynelle Fanny, MD   REQUESTING/REFERRING PHYSICIAN: Dr. Gonzella Lex  CHIEF COMPLAINT:   Chief Complaint  Patient presents with  . Chest Pain  . Shortness of Breath    HISTORY OF PRESENT ILLNESS:  [name]  is a 76 y.o. male with a known history of Coronary artery disease status post stent placement, history of carotid artery disease, esophageal cancer with metastases currently ongoing chemotherapy, COPD with ongoing tobacco abuse, hypertension, hyperlipidemia, history of ischemic cardiomyopathy who presented to the hospital due to chest pain and shortness of breath. Patient says he was in usual state of health when this morning he woke up with chest pain that was located in the left side of his chest nonradiating and associated with a cough and some shortness of breath. Patient says she is shortness of breath has gotten progressively worse over the past few days. And today it was significantly worse and therefore he came to the ER for further evaluation. Patient admits to cough which is productive with clear sputum but no hemoptysis. He admits to some nausea but no vomiting. He denies any melena, hematochezia, abdominal pain, chills, documented fever or any other associated symptoms presently. Hospitalist services were contacted further treatment and evaluation.  PAST MEDICAL HISTORY:   Past Medical History:  Diagnosis Date  . AAA (abdominal aortic aneurysm) (HCC)    4.2 cm IR AAA noted on 01/13/15 PET scan  . Anxiety   . CAD (coronary artery disease)    AMI 1997 w/ BMS x 2 LAD, BMS CFX 2002, DES x 2 LAD 2009, DES CFX & OM 2012   . Carotid artery disease ([place])    RICA CEA 4403, LICA CEA 4742 per Dr. [name],   . COPD (chronic obstructive pulmonary disease) ([place])   . GERD (gastroesophageal  reflux disease)    occ  . HTN (hypertension)   . Hydradenitis   . Hyperlipidemia   . Ischemic cardiomyopathy    EF 40-45 percent at cath 2012  . Myocardial infarction ([place])   . Non-small cell lung cancer (NSCLC) ([place]) dx'd 12/2014   SBRT  . Osteoarthritis    pt. denies 01/17/2015  . PAC (premature atrial contraction)   . Peripheral vascular disease ([place])   . Pneumonia   . Prostate cancer ([place]) dx'd approx 2008   xrt throughfoley  . Radiation 02/18/15-02/25/15   right upper lung 54 Gy  . Shortness of breath dyspnea   . Stroke [facility]) 11-24-11   "no feeling in right 5th digit"  . Urinary frequency   . Urinary urgency     PAST SURGICAL HISTORY:   Past Surgical History:  Procedure Laterality Date  . CARDIAC CATHETERIZATION    . CARDIOVASCULAR STRESS TEST  12-2006  . CARDIOVERSION N/A 10/31/2015   Procedure: CARDIOVERSION;  Surgeon: Sueanne Margarita, MD;  Location: [facility];  Service: Cardiovascular;  Laterality: N/A;  . CAROTID ENDARTERECTOMY Right 03-2000   CE  . CAROTID ENDARTERECTOMY Left 11-24-11   CE  . COLONOSCOPY  04-2001   polyp  . [place], 2002, 2009, 2012   8 stents total  . [place]   neg  . ENDARTERECTOMY  11/24/2011   Procedure: ENDARTERECTOMY CAROTID;  Surgeon: Rosetta Posner, MD;  Location: [facility];  Service: Vascular;  Laterality: Left;  . EYE SURGERY Bilateral Aug. 17, 2015   Cataract  . FUDUCIAL PLACEMENT N/A 12/25/2014   Procedure: PLACEMENT OF FUDUCIAL;  Surgeon: Collene Gobble, MD;  Location: [facility];  Service: Thoracic;  Laterality: N/A;  . IR GENERIC HISTORICAL  03/18/2016   IR US GUIDE VASC ACCESS LEFT 03/18/2016 Jacqulynn Cadet, MD [facility]-INTERV RAD  . IR GENERIC HISTORICAL  03/18/2016   IR FLUORO GUIDE PORT INSERTION LEFT 03/18/2016 Jacqulynn Cadet, MD [facility]  . PROSTATE SURGERY     Radiation Tx  X's 40  . TEE WITHOUT CARDIOVERSION N/A 10/31/2015   Procedure: TRANSESOPHAGEAL ECHOCARDIOGRAM (TEE);  Surgeon:  Sueanne Margarita, MD;  Location: [facility] ENDOSCOPY;  Service: Cardiovascular;  Laterality: N/A;  . VASCULAR SURGERY    . VIDEO BRONCHOSCOPY WITH ENDOBRONCHIAL NAVIGATION N/A 12/25/2014   Procedure: VIDEO BRONCHOSCOPY WITH ENDOBRONCHIAL NAVIGATION  with Biopsies and Brushings;  Surgeon: Collene Gobble, MD;  Location: [facility];  Service: Thoracic;  Laterality: N/A;  . VIDEO BRONCHOSCOPY WITH ENDOBRONCHIAL ULTRASOUND N/A 01/22/2015   Procedure: VIDEO BRONCHOSCOPY WITH ENDOBRONCHIAL ULTRASOUND with Biopsies;  Surgeon: Collene Gobble, MD;  Location: [facility];  Service: Thoracic;  Laterality: N/A;    SOCIAL HISTORY:   Social History  Substance Use Topics  . Smoking status: Current Every Day Smoker    Packs/day: 0.50    Years: 55.00    Types: Cigarettes  . Smokeless tobacco: Never Used     Comment: 1/2 ppd (01/07/15)  . Alcohol use 0.0 oz/week     Comment: occ    FAMILY HISTORY:   Family History  Problem Relation Age of Onset  . Stroke Father   . Heart disease Father   . Heart disease Mother        pacemaker  . Other Brother        benign brain tumor  . Cancer Brother        Eye-behind  . Cancer Sister        Breast  . Liver cancer Brother   . Diabetes Maternal Grandmother   . Colon polyps Neg Hx   . Esophageal cancer Neg Hx     DRUG ALLERGIES:   Allergies  Allergen Reactions  . Augmentin [Amoxicillin-Pot Clavulanate] Diarrhea, Nausea And Vomiting and Other (See Comments)    Has patient had a PCN reaction causing immediate rash, facial/tongue/throat swelling, SOB or lightheadedness with hypotension: No Has patient had a PCN reaction causing severe rash involving mucus membranes or skin necrosis: No Has patient had a PCN reaction that required hospitalization No Has patient had a PCN reaction occurring within the last 10 years: Yes If all of the above answers are "NO", then may proceed with Cephalosporin use.   . Chantix [Varenicline] Other (See Comments)    Violent nightmares  .  Sulfonamide Derivatives Other (See Comments)    Pt do not remember 8-30 md  . Zantac [Ranitidine Hcl] Diarrhea    REVIEW OF SYSTEMS:   Review of Systems  Constitutional: Negative for fever and weight loss.  HENT: Negative for congestion, nosebleeds and tinnitus.   Eyes: Negative for blurred vision, double vision and redness.  Respiratory: Positive for cough, sputum production, shortness of breath and wheezing. Negative for hemoptysis.   Cardiovascular: Positive for chest pain. Negative for orthopnea, leg swelling and PND.  Gastrointestinal: Negative for abdominal pain, diarrhea, melena, nausea and vomiting.  Genitourinary: Negative for dysuria, hematuria and urgency.  Musculoskeletal: Negative for falls and joint pain.  Neurological: Negative for dizziness, tingling, sensory change, focal weakness, seizures, weakness and headaches.  Endo/Heme/Allergies: Negative for polydipsia. Does not bruise/bleed easily.  Psychiatric/Behavioral: Negative for depression and memory loss. The patient is not nervous/anxious.     MEDICATIONS AT HOME:   Prior to Admission medications   Medication Sig Start Date End Date Taking? Authorizing Provider  acetaminophen (TYLENOL) 500 MG tablet Take 1,000 mg by mouth every 4 (four) hours as needed for mild pain, moderate pain, fever or headache.  10/17/15  Yes [provider]  ALPRAZolam (XANAX) 0.5 MG tablet Take 0.5-1 tablets (0.25-0.5 mg total) by mouth daily as needed for anxiety or sleep. 05/18/16  Yes Owens Shark, NP  clopidogrel (PLAVIX) 75 MG tablet TAKE 1 TABLET BY MOUTH EVERY DAY 06/07/16  Yes Burnell Blanks, MD  diphenoxylate-atropine (LOMOTIL) 2.5-0.025 MG tablet Take 1-2 tablets by mouth 4 (four) times daily as needed for diarrhea or loose stools. 06/15/16  Yes Owens Shark, NP  fludrocortisone (FLORINEF) 0.1 MG tablet Take 1 tablet (0.1 mg total) by mouth daily. 03/01/16  Yes Tower, Wynelle Fanny, MD  fluticasone-salmeterol (ADVAIR HFA)  619-50 MCG/ACT inhaler Inhale 2 puffs into the lungs 2 (two) times daily.   Yes [provider]  ipratropium-albuterol (DUONEB) 0.5-2.5 (3) MG/3ML SOLN Take 3 mLs by nebulization every 4 (four) hours as needed (shortness of breath and wheezing).   Yes [provider]  lidocaine-prilocaine (EMLA) cream Apply to port site one hour prior to use. Do not rub in. Cover with plastic. 07/07/16  Yes Ladell Pier, MD  loratadine (CLARITIN) 10 MG tablet TAKE 1 TABLET BY MOUTH EVERY DAY 07/22/16  Yes Collene Gobble, MD  mirtazapine (REMERON) 15 MG tablet TAKE 1 TABLET (15 MG TOTAL) BY MOUTH AT BEDTIME. 07/05/16  Yes Tower, Wynelle Fanny, MD  Multiple Vitamin (MULTIVITAMIN WITH MINERALS) TABS tablet Take 1 tablet by mouth daily.   Yes [provider]  nitroGLYCERIN (NITROSTAT) 0.4 MG SL tablet Place 0.4 mg under the tongue every 5 (five) minutes as needed for chest pain.   Yes [provider]  omeprazole (PRILOSEC) 40 MG capsule TAKE 1 CAPSULE 30 MINUTES BEFORE BREAKFAST AND DINNER Patient taking differently: TAKE 1 CAPSULE po bid 06/28/16  Yes Ladene Artist, MD  potassium chloride 20 MEQ/15ML (10%) SOLN Take 15 mLs (20 mEq total) by mouth daily. 07/06/16  Yes Owens Shark, NP  predniSONE (DELTASONE) 20 MG tablet Take 0.5 tablets (10 mg total) by mouth daily with breakfast. To stimulate appetite Patient taking differently: Take 10 mg by mouth every other day. To stimulate appetite 07/14/16  Yes Ladell Pier, MD  prochlorperazine (COMPAZINE) 5 MG tablet Take 1 tablet (5 mg total) by mouth every 6 (six) hours as needed for nausea or vomiting. 07/27/16  Yes Ladell Pier, MD  tiotropium (SPIRIVA) 18 MCG inhalation capsule Place 1 capsule (18 mcg total) into inhaler and inhale daily. 02/24/16  Yes Parrett, Tammy S, NP  VENTOLIN HFA 108 (90 Base) MCG/ACT inhaler INHALE 2 PUFFS INTO THE LUNGS EVERY 4 (FOUR) HOURS AS NEEDED FOR WHEEZING OR SHORTNESS OF BREATH. 05/12/16  Yes Tower, Marne  A, MD  lidocaine (XYLOCAINE) 2 % solution Use as directed 20 mLs in the mouth or throat as needed for mouth pain.    [provider]  traMADol (ULTRAM) 50 MG tablet Take 1 tablet (50 mg total) by mouth every 6 (six) hours as needed. 07/29/16   Ladell Pier, MD  VITAL SIGNS:  Blood pressure 126/73, pulse 92, temperature 98.3 F (36.8 C), temperature source Oral, resp. rate (!) 31, height '5\' 8"'$  (1.727 m), weight 57.2 kg (126 lb), SpO2 97 %.  PHYSICAL EXAMINATION:  Physical Exam  GENERAL:  76 y.o.-year-old patient lying in bed in mild Resp. Distress.  EYES: Pupils equal, round, reactive to light and accommodation. No scleral icterus. Extraocular muscles intact.  HEENT: Head atraumatic, normocephalic. Oropharynx and nasopharynx clear. No oropharyngeal erythema, moist oral mucosa  NECK:  Supple, no jugular venous distention. No thyroid enlargement, no tenderness.  LUNGS: Prolonged inspiratory and expiratory phase, diffuse rhonchi and wheezing bilaterally. Negative use of accessory muscles, no dullness to percussion CARDIOVASCULAR: S1, S2 RRR. No murmurs, rubs, gallops, clicks.  ABDOMEN: Soft, nontender, nondistended. Bowel sounds present. No organomegaly or mass.  EXTREMITIES: No pedal edema, cyanosis, or clubbing. + 2 pedal & radial pulses b/l.   NEUROLOGIC: Cranial nerves II through XII are intact. No focal Motor or sensory deficits appreciated b/l PSYCHIATRIC: The patient is alert and oriented x 3.  SKIN: No obvious rash, lesion, or ulcer.   LABORATORY PANEL:   CBC  Recent Labs Lab 08/04/16 1333  WBC 9.7  HGB 8.8*  HCT 26.9*  PLT 436   ------------------------------------------------------------------------------------------------------------------  Chemistries   Recent Labs Lab 08/04/16 1333  NA 135  K 3.7  CL 101  CO2 25  GLUCOSE 139*  BUN 14  CREATININE 0.84  CALCIUM 8.7*    ------------------------------------------------------------------------------------------------------------------  Cardiac Enzymes  Recent Labs Lab 08/04/16 1333  TROPONINI <0.03   ------------------------------------------------------------------------------------------------------------------  RADIOLOGY:  Ct Angio Chest Pe W Or Wo Contrast  Result Date: 08/04/2016 CLINICAL DATA:  Chest pain. Shortness of breath. History of esophageal cancer. EXAM: CT ANGIOGRAPHY CHEST WITH CONTRAST TECHNIQUE: Multidetector CT imaging of the chest was performed using the standard protocol during bolus administration of intravenous contrast. Multiplanar CT image reconstructions and MIPs were obtained to evaluate the vascular anatomy. CONTRAST:  75 mL of Isovue 370 intravenously. COMPARISON:  CT scan of Jul 21, 2016. FINDINGS: Cardiovascular: Atherosclerosis of thoracic aorta is noted without aneurysm or dissection. There is no definite evidence of pulmonary embolus. Coronary artery calcifications are noted. Normal cardiac size. No pericardial effusion is noted. Mediastinum/Nodes: No enlarged mediastinal, hilar, or axillary lymph nodes. Thyroid gland, trachea, and esophagus demonstrate no significant findings. Lungs/Pleura: Mild left pneumothorax is noted. Mild right pleural effusion is noted. Right lower lobe atelectasis or pneumonia is noted. Stable 8 mm subpleural nodule is noted in left upper lobe. Stable 9 mm nodule is noted more medially in left upper lobe. Stable 4 mm right upper lobe nodule is noted. Stable density is seen laterally in right upper lobe most consistent with scarring. Bulla formation is noted in the right upper lobe. Upper Abdomen: Stable large left renal cyst. No other definite abnormality seen in visualized portion of upper abdomen. Musculoskeletal: Multiple sclerotic and lytic lesions are seen throughout the ribs and spine consistent with metastatic disease. Review of the MIP images  confirms the above findings. IMPRESSION: Aortic atherosclerosis. Coronary artery calcifications are noted suggesting coronary artery disease. No definite evidence of pulmonary embolus. Stable bilateral pulmonary nodules are noted concerning for metastatic disease. Stable osseous metastases. Mild right pleural effusion is noted. Right lower lobe atelectasis or pneumonia is noted. Interval development of mild left pneumothorax which is less than 10%. Critical Value/emergent results were called by telephone at the time of interpretation on 08/04/2016 at 3:03 pm to Dr. [place] Re , who verbally acknowledged  these results. Electronically Signed   By: Marijo Conception, M.D.   On: 08/04/2016 15:04     IMPRESSION AND PLAN:   76 year old male with past medical history of hypertension, hyperlipidemia, COPD with ongoing tobacco abuse, esophageal cancer with metastatic disease currently undergoing chemotherapy, history of coronary artery disease status post stent, history of carotid artery disease, previous CVA, ischemic cardiomyopathy who presents to the hospital due to chest pain and shortness of breath.  1. Acute respiratory failure with hypoxia-secondary to COPD exacerbation. -We'll treat the patient with IV steroids, scheduled DuoNeb's, Pulmicort nebs. Continue O2 supplementation, assessed the patient for home oxygen prior to discharge.  2. COPD exacerbation-secondary to ongoing tobacco abuse and also suspected pneumonia seen on the CT chest. -We'll treat the patient with IV steroids, scheduled DuoNeb's, Pulmicort nebs. -We'll treat the patient with IV ceftriaxone, Zithromax. Follow sputum cultures. Assessment home oxygen prior to discharge.  3. Pneumothorax-patient was noted to have a small less than 10% pneumothorax on CT chest. Does not require any intervention or chest tube at this time. Follow clinically.  4. Anxiety-continue Xanax as needed.  5. GERD - cont. Protonix.   6. Depression - cont.  Remeron.      All the records are reviewed and case discussed with ED provider. Management plans discussed with the patient, family and they are in agreement.  CODE STATUS: Full code  TOTAL TIME TAKING CARE OF THIS PATIENT: 45 minutes.    Henreitta Leber M.D on 08/04/2016 at 3:52 PM  Between 7am to 6pm - Pager - [phone]  After 6pm go to [email or web address] - password [email or web address] [facility]  [facility]  Office  [phone]  CC: Primary care physician; Tower, Wynelle Fanny, MD

## 2016-08-04 NOTE — ED Provider Notes (Signed)
[facility] Emergency Department Provider Note  ____________________________________________  Time seen: Approximately 1:32 PM  I have reviewed the triage vital signs and the nursing notes.   HISTORY  Chief Complaint Chest Pain and Shortness of Breath   HPI [name] is a 76 y.o. 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 Patient reports that his symptoms started yesterday evening. His complaining of a sharp intermittent pain located in the left side of his lower lung that has been intermittent, 7/10, associated with SOB and wheezing. Patient has worsening baseline chronic cough which is dry. No fever or chills. He has been wheezing. Last chemotherapy was a week ago. He missed chemotherapy yesterday because he wasn't feeling well. No abdominal pain, no nausea, no vomiting, no diarrhea, no dysuria. Patient has no prior history of blood clots.   Past Medical History:  Diagnosis Date  . AAA (abdominal aortic aneurysm) (HCC)    4.2 cm IR AAA noted on 01/13/15 PET scan  . Anxiety   . CAD (coronary artery disease)    AMI 1997 w/ BMS x 2 LAD, BMS CFX 2002, DES x 2 LAD 2009, DES CFX & OM 2012   . Carotid artery disease ([place])    RICA CEA 5361, LICA CEA 4431 per Dr. [name],   . COPD (chronic obstructive pulmonary disease) ([place])   . GERD (gastroesophageal reflux disease)    occ  . HTN (hypertension)   . Hydradenitis   . Hyperlipidemia   . Ischemic cardiomyopathy    EF 40-45 percent at cath 2012  . Myocardial infarction ([place])   . Non-small cell lung cancer (NSCLC) ([place]) dx'd 12/2014   SBRT  . Osteoarthritis    pt. denies 01/17/2015  . PAC (premature atrial contraction)   . Peripheral vascular disease ([place])   . Pneumonia   . Prostate cancer ([place]) dx'd approx 2008   xrt  throughfoley  . Radiation 02/18/15-02/25/15   right upper lung 54 Gy  . Shortness of breath dyspnea   . Stroke [facility]) 11-24-11   "no feeling in right 5th digit"  . Urinary frequency   . Urinary urgency     Patient Active Problem List   Diagnosis Date Noted  . Port catheter in place 04/06/2016  . Cancer of lower third of esophagus ([place]) 02/24/2016  . Dysphagia 02/04/2016  . Left arm weakness 12/19/2015  . Numbness and tingling in left arm 12/19/2015  . Left shoulder pain 12/19/2015  . History of recent fall 12/19/2015  . Paresthesia of arm 12/01/2015  . Depression 12/01/2015  . Malnutrition of moderate degree 11/20/2015  . TIA (transient ischemic attack) 11/19/2015  . Weakness 11/17/2015  . Physical deconditioning 11/10/2015  . Multiple rib fractures 11/10/2015  . Chronic systolic heart failure ([place]) 11/02/2015  . Atrial flutter with rapid ventricular response ([place]) 10/30/2015  . Atrial flutter ([place]) 10/30/2015  . B12 deficiency 07/22/2015  . Anemia 07/15/2015  . Hematuria 07/08/2015  . Unintentional weight loss 07/08/2015  . Hypotension 07/08/2015  . COPD with emphysema ([place]) 05/22/2015  . Cough 05/22/2015  . Tobacco user 05/22/2015  . Mediastinal lymphadenopathy 01/22/2015  . 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 S/P bronchoscopy with biopsy   . Colon cancer screening 11/06/2014  . Encounter for Medicare annual wellness exam 11/06/2014  . Routine general medical examination  at a health care facility 11/06/2014  . Lung nodule 06/28/2014  . Carotid stenosis 12/20/2013  . Goals of care, counseling/discussion 12/20/2013  . Aftercare following surgery of the circulatory system, [place] 12/19/2012  . Personal history of colonic polyps 08/28/2012  . Hyperglycemia 08/07/2012  . Pre-operative cardiovascular examination 11/03/2011  . Carotid artery disease ([place]) 05/20/2011  . Grief reaction 05/07/2011  . Ischemic cardiomyopathy 01/13/2011  . Palpitations 11/10/2010  .  Occlusion and stenosis of carotid artery without mention of cerebral infarction 04/02/2010  . Anxiety disorder 12/12/2009  . HYPERTENSION, BENIGN 09/29/2008  . History of prostate cancer 09/19/2008  . Hyperlipidemia 02/10/2007  . AMAUROSIS FUGAX 02/10/2007  . MYOCARDIAL INFARCTION, HX OF 02/10/2007  . Coronary atherosclerosis 02/10/2007  . PERIPHERAL VASCULAR DISEASE 02/10/2007  . HEMORRHOIDS 02/10/2007  . Chronic obstructive pulmonary disease ([place]) 02/10/2007  . OSTEOARTHRITIS 02/10/2007  . Nicotine dependence 02/10/2007  . Pure hypercholesterolemia 01/27/2007  . CORONARY ATHEROSCLEROSIS NATIVE CORONARY ARTERY 01/27/2007    Past Surgical History:  Procedure Laterality Date  . CARDIAC CATHETERIZATION    . CARDIOVASCULAR STRESS TEST  12-2006  . CARDIOVERSION N/A 10/31/2015   Procedure: CARDIOVERSION;  Surgeon: Sueanne Margarita, MD;  Location: [place];  Service: Cardiovascular;  Laterality: N/A;  . CAROTID ENDARTERECTOMY Right 03-2000   CE  . CAROTID ENDARTERECTOMY Left 11-24-11   CE  . COLONOSCOPY  04-2001   polyp  . [place], 2002, 2009, 2012   8 stents total  . [place]   neg  . ENDARTERECTOMY  11/24/2011   Procedure: ENDARTERECTOMY CAROTID;  Surgeon: Rosetta Posner, MD;  Location: [place];  Service: Vascular;  Laterality: Left;  . EYE SURGERY Bilateral Aug. 17, 2015   Cataract  . FUDUCIAL PLACEMENT N/A 12/25/2014   Procedure: PLACEMENT OF FUDUCIAL;  Surgeon: Collene Gobble, MD;  Location: [place];  Service: Thoracic;  Laterality: N/A;  . IR GENERIC HISTORICAL  03/18/2016   IR US GUIDE VASC ACCESS LEFT 03/18/2016 Jacqulynn Cadet, MD [facility]-INTERV RAD  . IR GENERIC HISTORICAL  03/18/2016   IR FLUORO GUIDE PORT INSERTION LEFT 03/18/2016 Jacqulynn Cadet, MD [facility]  . PROSTATE SURGERY     Radiation Tx  X's 40  . TEE WITHOUT CARDIOVERSION N/A 10/31/2015   Procedure: TRANSESOPHAGEAL ECHOCARDIOGRAM (TEE);  Surgeon: Sueanne Margarita, MD;   Location: [facility] ENDOSCOPY;  Service: Cardiovascular;  Laterality: N/A;  . VASCULAR SURGERY    . VIDEO BRONCHOSCOPY WITH ENDOBRONCHIAL NAVIGATION N/A 12/25/2014   Procedure: VIDEO BRONCHOSCOPY WITH ENDOBRONCHIAL NAVIGATION  with Biopsies and Brushings;  Surgeon: Collene Gobble, MD;  Location: [place];  Service: Thoracic;  Laterality: N/A;  . VIDEO BRONCHOSCOPY WITH ENDOBRONCHIAL ULTRASOUND N/A 01/22/2015   Procedure: VIDEO BRONCHOSCOPY WITH ENDOBRONCHIAL ULTRASOUND with Biopsies;  Surgeon: Collene Gobble, MD;  Location: [place];  Service: Thoracic;  Laterality: N/A;    Prior to Admission medications   Medication Sig Start Date End Date Taking? Authorizing Provider  acetaminophen (TYLENOL) 500 MG tablet Take 1,000 mg by mouth every 4 (four) hours as needed for mild pain, moderate pain, fever or headache.  10/17/15  Yes [provider]  ALPRAZolam (XANAX) 0.5 MG tablet Take 0.5-1 tablets (0.25-0.5 mg total) by mouth daily as needed for anxiety or sleep. 05/18/16  Yes Owens Shark, NP  clopidogrel (PLAVIX) 75 MG tablet TAKE 1 TABLET BY MOUTH EVERY DAY 06/07/16  Yes Burnell Blanks, MD  diphenoxylate-atropine (LOMOTIL) 2.5-0.025 MG tablet Take 1-2 tablets  by mouth 4 (four) times daily as needed for diarrhea or loose stools. 06/15/16  Yes Owens Shark, NP  fludrocortisone (FLORINEF) 0.1 MG tablet Take 1 tablet (0.1 mg total) by mouth daily. 03/01/16  Yes Tower, Wynelle Fanny, MD  fluticasone-salmeterol (ADVAIR HFA) 403-47 MCG/ACT inhaler Inhale 2 puffs into the lungs 2 (two) times daily.   Yes [provider]  ipratropium-albuterol (DUONEB) 0.5-2.5 (3) MG/3ML SOLN Take 3 mLs by nebulization every 4 (four) hours as needed (shortness of breath and wheezing).   Yes [provider]  lidocaine-prilocaine (EMLA) cream Apply to port site one hour prior to use. Do not rub in. Cover with plastic. 07/07/16  Yes Ladell Pier, MD  loratadine (CLARITIN) 10 MG tablet TAKE 1 TABLET BY MOUTH EVERY  DAY 07/22/16  Yes Collene Gobble, MD  mirtazapine (REMERON) 15 MG tablet TAKE 1 TABLET (15 MG TOTAL) BY MOUTH AT BEDTIME. 07/05/16  Yes Tower, Wynelle Fanny, MD  Multiple Vitamin (MULTIVITAMIN WITH MINERALS) TABS tablet Take 1 tablet by mouth daily.   Yes [provider]  nitroGLYCERIN (NITROSTAT) 0.4 MG SL tablet Place 0.4 mg under the tongue every 5 (five) minutes as needed for chest pain.   Yes [provider]  omeprazole (PRILOSEC) 40 MG capsule TAKE 1 CAPSULE 30 MINUTES BEFORE BREAKFAST AND DINNER Patient taking differently: TAKE 1 CAPSULE po bid 06/28/16  Yes Ladene Artist, MD  potassium chloride 20 MEQ/15ML (10%) SOLN Take 15 mLs (20 mEq total) by mouth daily. 07/06/16  Yes Owens Shark, NP  predniSONE (DELTASONE) 20 MG tablet Take 0.5 tablets (10 mg total) by mouth daily with breakfast. To stimulate appetite Patient taking differently: Take 10 mg by mouth every other day. To stimulate appetite 07/14/16  Yes Ladell Pier, MD  prochlorperazine (COMPAZINE) 5 MG tablet Take 1 tablet (5 mg total) by mouth every 6 (six) hours as needed for nausea or vomiting. 07/27/16  Yes Ladell Pier, MD  tiotropium (SPIRIVA) 18 MCG inhalation capsule Place 1 capsule (18 mcg total) into inhaler and inhale daily. 02/24/16  Yes Parrett, Tammy S, NP  VENTOLIN HFA 108 (90 Base) MCG/ACT inhaler INHALE 2 PUFFS INTO THE LUNGS EVERY 4 (FOUR) HOURS AS NEEDED FOR WHEEZING OR SHORTNESS OF BREATH. 05/12/16  Yes Tower, Marne A, MD  lidocaine (XYLOCAINE) 2 % solution Use as directed 20 mLs in the mouth or throat as needed for mouth pain.    [provider]  traMADol (ULTRAM) 50 MG tablet Take 1 tablet (50 mg total) by mouth every 6 (six) hours as needed. 07/29/16   Ladell Pier, MD    Allergies Augmentin [amoxicillin-pot clavulanate]; Chantix [varenicline]; Sulfonamide derivatives; and Zantac [ranitidine hcl]  Family History  Problem Relation Age of Onset  . Stroke Father   . Heart disease  Father   . Heart disease Mother        pacemaker  . Other Brother        benign brain tumor  . Cancer Brother        Eye-behind  . Cancer Sister        Breast  . Liver cancer Brother   . Diabetes Maternal Grandmother   . Colon polyps Neg Hx   . Esophageal cancer Neg Hx     Social History Social History  Substance Use Topics  . Smoking status: Current Every Day Smoker    Packs/day: 0.50    Years: 55.00    Types: Cigarettes  . Smokeless  tobacco: Never Used     Comment: 1/2 ppd (01/07/15)  . Alcohol use 0.0 oz/week     Comment: occ    Review of Systems  Constitutional: Negative for fever. Eyes: Negative for visual changes. ENT: Negative for sore throat. Neck: No neck pain  Cardiovascular: + chest pain. Respiratory: + shortness of breath. Gastrointestinal: Negative for abdominal pain, vomiting or diarrhea. Genitourinary: Negative for dysuria. Musculoskeletal: Negative for back pain. Skin: Negative for rash. Neurological: Negative for headaches, weakness or numbness. Psych: No SI or HI  ____________________________________________   PHYSICAL EXAM:  VITAL SIGNS: ED Triage Vitals  Enc Vitals Group     BP 08/04/16 1238 (!) 83/52     Pulse Rate 08/04/16 1238 98     Resp 08/04/16 1237 (!) 24     Temp 08/04/16 1237 98.3 F (36.8 C)     Temp Source 08/04/16 1237 Oral     SpO2 08/04/16 1238 97 %     Weight 08/04/16 1236 126 lb (57.2 kg)     Height 08/04/16 1236 '5\' 8"'$  (1.727 m)     Head Circumference --      Peak Flow --      Pain Score 08/04/16 1236 9     Pain Loc --      Pain Edu? --      Excl. in [place]? --     Constitutional: Alert and oriented. Well appearing and in no apparent distress. HEENT:      Head: Normocephalic and atraumatic.         Eyes: Conjunctivae are normal. Sclera is non-icteric.       Mouth/Throat: Mucous membranes are moist.       Neck: Supple with no signs of meningismus. Cardiovascular: Regular rate and rhythm. No murmurs, gallops, or  rubs. 2+ symmetrical distal pulses are present in all extremities. No JVD. Respiratory: Increased work of breathing, normal sats, diffuse expiratory wheezes  Gastrointestinal: Soft, non tender, and non distended with positive bowel sounds. No rebound or guarding. Genitourinary: No CVA tenderness. Musculoskeletal: Nontender with normal range of motion in all extremities. No edema, cyanosis, or erythema of extremities. Neurologic: Normal speech and language. Face is symmetric. Moving all extremities. No gross focal neurologic deficits are appreciated. Skin: Skin is warm, dry and intact. No rash noted. Psychiatric: Mood and affect are normal. Speech and behavior are normal.  ____________________________________________   LABS (all labs ordered are listed, but only abnormal results are displayed)  Labs Reviewed  CBC WITH DIFFERENTIAL/PLATELET - Abnormal; Notable for the following:       Result Value   RBC 3.05 (*)    Hemoglobin 8.8 (*)    HCT 26.9 (*)    RDW 22.6 (*)    Neutro Abs 8.4 (*)    Lymphs Abs 0.6 (*)    All other components within normal limits  BASIC METABOLIC PANEL - Abnormal; Notable for the following:    Glucose, Bld 139 (*)    Calcium 8.7 (*)    All other components within normal limits  TROPONIN I   ____________________________________________  EKG  ED ECG REPORT I, Rudene Re, the attending physician, personally viewed and interpreted this ECG.  Sinus tachycardia, rate of 100, normal intervals, normal axis, no ST elevations or depressions, diffuse T-wave flattening.  ____________________________________________  RADIOLOGY  CTA chest: Aortic atherosclerosis. Coronary artery calcifications are noted suggesting coronary artery Disease. No definite evidence of pulmonary embolus. Stable bilateral pulmonary nodules are noted concerning for metastatic disease. Stable osseous metastases.  Mild right pleural effusion is noted. Right lower lobe atelectasis or  pneumonia is noted. Interval development of mild left pneumothorax which is less than 10%. ____________________________________________   PROCEDURES  Procedure(s) performed: None Procedures Critical Care performed: yes  CRITICAL CARE Performed by: Rudene Re  ?  Total critical care time: 35 min  Critical care time was exclusive of separately billable procedures and treating other patients.  Critical care was necessary to treat or prevent imminent or life-threatening deterioration.  Critical care was time spent personally by me on the following activities: development of treatment plan with patient and/or surrogate as well as nursing, discussions with consultants, evaluation of patient's response to treatment, examination of patient, obtaining history from patient or surrogate, ordering and performing treatments and interventions, ordering and review of laboratory studies, ordering and review of radiographic studies, pulse oximetry and re-evaluation of patient's condition.  ____________________________________________   INITIAL IMPRESSION / ASSESSMENT AND PLAN / ED COURSE   76 y.o. 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 Patient has increased work of breathing with diffuse expiratory wheezes and worsening cough. Sats are normal. He is afebrile. Patient initially hypotensive in triage however that resolved without intervention by the time patient was placed in the room. We'll give doing nebs and Solu-Medrol for COPD exacerbation. We'll check a chest x-ray to rule out pneumonia. I'm also concerned for pulmonary embolism in this cancer patient with sharp left-sided chest pain. Will pursue CT of the chest.   Clinical Course as of Aug 04 1513  Wed Aug 04, 2016  1508 CT concerning for  less than 10% pneumothorax on the left and right-sided pneumonia. Since patient is a cancer patient with multiple visits for chemotherapy will treat for healthcare associated pneumonia. Patient will receive IV cefepime, vague, and by mouth azithromycin. Patient will be placed on oxygen. No indication for chest tube. Patient be admitted to the hospitalist service.  [CV]    Clinical Course User Index [CV] Rudene Re, MD    Pertinent labs & imaging results that were available during my care of the patient were reviewed by me and considered in my medical decision making (see chart for details).    ____________________________________________   FINAL CLINICAL IMPRESSION(S) / ED DIAGNOSES  Final diagnoses:  Chest pain  COPD exacerbation ([place])  Healthcare-associated pneumonia  Primary spontaneous pneumothorax      NEW MEDICATIONS STARTED DURING THIS VISIT:  New Prescriptions   No medications on file     Note:  This document was prepared using Dragon voice recognition software and may include unintentional dictation errors.    Alfred Levins, [place], MD 08/04/16 [phone]

## 2016-08-04 NOTE — Progress Notes (Signed)
   [place] [facility] Day: 0 days [name] is a 76 y.o. male presenting with Chest Pain and Shortness of Breath with hx of Esophogeal Cancer w/ mets, COPD, HtN, coronary artery disease, ischemic cardiomyopathy, anxiety/depression.   Advance care planning discussed with patient  with additional Family at bedside. All questions in regards to overall condition were answered. The decision was made to continue current code status  CODE STATUS: full Time spent: 15 minutes

## 2016-08-05 ENCOUNTER — Other Ambulatory Visit: Payer: Self-pay | Admitting: *Deleted

## 2016-08-05 ENCOUNTER — Inpatient Hospital Stay: Payer: Medicare Other

## 2016-08-05 DIAGNOSIS — Z955 Presence of coronary angioplasty implant and graft: Secondary | ICD-10-CM | POA: Diagnosis not present

## 2016-08-05 DIAGNOSIS — E785 Hyperlipidemia, unspecified: Secondary | ICD-10-CM | POA: Diagnosis not present

## 2016-08-05 DIAGNOSIS — I255 Ischemic cardiomyopathy: Secondary | ICD-10-CM | POA: Diagnosis not present

## 2016-08-05 DIAGNOSIS — I252 Old myocardial infarction: Secondary | ICD-10-CM | POA: Diagnosis not present

## 2016-08-05 DIAGNOSIS — Z881 Allergy status to other antibiotic agents status: Secondary | ICD-10-CM | POA: Diagnosis not present

## 2016-08-05 DIAGNOSIS — I10 Essential (primary) hypertension: Secondary | ICD-10-CM | POA: Diagnosis not present

## 2016-08-05 DIAGNOSIS — I714 Abdominal aortic aneurysm, without rupture: Secondary | ICD-10-CM | POA: Diagnosis not present

## 2016-08-05 DIAGNOSIS — J189 Pneumonia, unspecified organism: Secondary | ICD-10-CM | POA: Diagnosis not present

## 2016-08-05 DIAGNOSIS — J939 Pneumothorax, unspecified: Secondary | ICD-10-CM | POA: Diagnosis not present

## 2016-08-05 DIAGNOSIS — J9311 Primary spontaneous pneumothorax: Secondary | ICD-10-CM | POA: Diagnosis not present

## 2016-08-05 DIAGNOSIS — Z85118 Personal history of other malignant neoplasm of bronchus and lung: Secondary | ICD-10-CM | POA: Diagnosis not present

## 2016-08-05 DIAGNOSIS — J90 Pleural effusion, not elsewhere classified: Secondary | ICD-10-CM | POA: Diagnosis not present

## 2016-08-05 DIAGNOSIS — I739 Peripheral vascular disease, unspecified: Secondary | ICD-10-CM | POA: Diagnosis not present

## 2016-08-05 DIAGNOSIS — I2510 Atherosclerotic heart disease of native coronary artery without angina pectoris: Secondary | ICD-10-CM | POA: Diagnosis not present

## 2016-08-05 DIAGNOSIS — J441 Chronic obstructive pulmonary disease with (acute) exacerbation: Secondary | ICD-10-CM | POA: Diagnosis not present

## 2016-08-05 DIAGNOSIS — K219 Gastro-esophageal reflux disease without esophagitis: Secondary | ICD-10-CM | POA: Diagnosis not present

## 2016-08-05 DIAGNOSIS — Z8673 Personal history of transient ischemic attack (TIA), and cerebral infarction without residual deficits: Secondary | ICD-10-CM | POA: Diagnosis not present

## 2016-08-05 DIAGNOSIS — J440 Chronic obstructive pulmonary disease with acute lower respiratory infection: Secondary | ICD-10-CM | POA: Diagnosis not present

## 2016-08-05 DIAGNOSIS — J9601 Acute respiratory failure with hypoxia: Secondary | ICD-10-CM | POA: Diagnosis not present

## 2016-08-05 LAB — CBC
HEMATOCRIT: 22.9 % — AB (ref 40.0–52.0)
HEMOGLOBIN: 7.7 g/dL — AB (ref 13.0–18.0)
MCH: 28.8 pg (ref 26.0–34.0)
MCHC: 33.4 g/dL (ref 32.0–36.0)
MCV: 86.3 fL (ref 80.0–100.0)
PLATELETS: 394 10*3/uL (ref 150–440)
RBC: 2.66 MIL/uL — AB (ref 4.40–5.90)
RDW: 23.1 % — ABNORMAL HIGH (ref 11.5–14.5)
WBC: 6.5 10*3/uL (ref 3.8–10.6)

## 2016-08-05 LAB — BASIC METABOLIC PANEL
Anion gap: 9 (ref 5–15)
BUN: 17 mg/dL (ref 6–20)
CHLORIDE: 99 mmol/L — AB (ref 101–111)
CO2: 24 mmol/L (ref 22–32)
CREATININE: 0.76 mg/dL (ref 0.61–1.24)
Calcium: 8.5 mg/dL — ABNORMAL LOW (ref 8.9–10.3)
GFR calc non Af Amer: >60 mL/min (ref >60)
Glucose, Bld: 183 mg/dL — ABNORMAL HIGH (ref 65–99)
POTASSIUM: 3.5 mmol/L (ref 3.5–5.1)
Sodium: 132 mmol/L — ABNORMAL LOW (ref 135–145)

## 2016-08-05 MED ORDER — FLUDROCORTISONE ACETATE 0.1 MG PO TABS: 0.1000 mg | ORAL_TABLET | Freq: Every day | ORAL | 3 refills | Status: DC

## 2016-08-05 MED ORDER — PREDNISONE 50 MG PO TABS: 50.0000 mg | ORAL_TABLET | Freq: Every day | ORAL | 0 refills | Status: AC

## 2016-08-05 MED ORDER — LEVOFLOXACIN 750 MG PO TABS: 750.0000 mg | ORAL_TABLET | Freq: Every day | ORAL | 0 refills | Status: DC

## 2016-08-05 MED ORDER — PREDNISONE 20 MG PO TABS: 10.0000 mg | ORAL_TABLET | ORAL | Status: AC

## 2016-08-05 NOTE — Telephone Encounter (Signed)
Fax refill request, pt is in hospital now but last filled on 03/01/16 #30 tabs with 5 additional refills, please advise

## 2016-08-05 NOTE — Progress Notes (Signed)
Initial Nutrition Assessment  DOCUMENTATION CODES:   Severe malnutrition in context of chronic illness  INTERVENTION:  Recommend Carnation Instant Breakfast in whole milk TID with meals, each supplement provides 280 kcal and 13 grams of protein. Patient reports he may prefer the ready-to-drink bottles (original is 240 kcal and 10 grams of protein, high protein is 220 kcal and 15 grams of protein).   Encouraged patient to continue drinking Ensure Plus as tolerated as it is higher in calories.  Continue daily multivitamin with minerals.  NUTRITION DIAGNOSIS:   Malnutrition (Severe) related to chronic illness (metastatic esophageal cancer on chemotherapy) as evidenced by 26.8 percent weight loss over 5 months, severe depletion of body fat, moderate depletions of muscle mass, severe depletion of muscle mass.  GOAL:   Patient will meet greater than or equal to 90% of their needs  MONITOR:   PO intake, Supplement acceptance, Labs, Weight trends, I & O's  REASON FOR ASSESSMENT:   Malnutrition Screening Tool    ASSESSMENT:   76 year old male with PMHx of COPD, CAD, HTN, HLD, PVD, hx of MI, GERD, anxiety, hydradenitis, ischemic cardiomyopathy, hx stroke 11/24/2011, AAA, history of prostate cancer 2008 s/p XRT, esophageal cancer with metastases currently on chemotherapy (Taxol) who presents with chest pain and SOB found to have acute exacerbation of COPD.   -Patient followed by outpatient RD at cancer center.  Spoke with patient at bedside. No family members present at time of assessment. He reports his appetite has been poor for a long time now. He reports he also has a hard time staying hydrated. He has been drinking 3 20 oz bottles of Gatorade daily. He reports he still drinks the Ensure Plus, but only drinks it maybe every other day now instead of 2-3 per day like he had been doing. It is too thick for him now. He eats 2-3 meals per day. Breakfast is usually yogurt or cereal or [place]  toast. Lunch may be a TV dinner or he may skip. His daughter who lives next door prepares a hot meal for dinner every night. He also eats one pint of ice cream each evening. He reports he can usually finish his breakfast meal and ice cream. Does not always finish his lunch or dinner. He is amenable to trying CIB in milk to see if he can tolerate it better. Patient denies any current issues swallowing. Reports he was having trouble swallowing but it has gotten better. Reports occasional constipation or diarrhea (alternates). Denies nausea or abdominal pain.  Patient reports UBW was around 160 lbs and he began losing weight in June/July when he broke some ribs. Per chart patient was 160.7 lbs on 10/30/2015 and lowest weight per chart is 117.6 lbs on 04/20/2016. That is a weight loss of 43.1 lbs (26.8% body weight) over 5 months, which is significant for time frame. Patient gained some weight back up to 128.2 lbs on 07/13/2016, but has since lost another 5.6 lbs (4.4% body weight) over the past 3 weeks, which is significant for time frame.   Meal Completion: 100% of breakfast this morning per chart (629 kcal, 16 grams of protein)  Medications reviewed and include: azithromycin, methylprednisolone 40 mg Q6hrs, Remeron, MVI daily, pantoprazole, ceftriaxone, tramadol PRN.  Labs reviewed: Sodium 132, Chloride 99.  Nutrition-Focused physical exam completed. Findings are severe fat depletion, moderate-severe muscle depletion, and moderate edema.   Diet Order:  Diet regular Room service appropriate? Yes; Fluid consistency: Thin  Skin:  Reviewed, no issues  Last BM:  PTA (08/02/2016 per chart)  Height:   Ht Readings from Last 1 Encounters:  08/04/16 '5\' 8"'$  (1.727 m)    Weight:   Wt Readings from Last 1 Encounters:  08/04/16 122 lb 9.6 oz (55.6 kg)    Ideal Body Weight:  70 kg  BMI:  Body mass index is 18.64 kg/m.  Estimated Nutritional Needs:   Kcal:  1645-1900 (MSJ x 1.3-1.5)  Protein:  70-85  grams (1.3-1.5 grams/kg)  Fluid:  1.6-1.9 L/day  EDUCATION NEEDS:   No education needs identified at this time  Willey Blade, MS, RD, LDN Pager: [phone] After Hours Pager: [phone]

## 2016-08-05 NOTE — Care Management (Signed)
patient for discharge home today.  Patient nor his daughter feel that patient is in need of any discharge services.  He does have home nebulizer and is not requiring oxygen.  Daughter will transport home.  Updated primary nurse.

## 2016-08-05 NOTE — Telephone Encounter (Signed)
Please refill times 3  If it is d/c in the hospital we can request to cancel it

## 2016-08-05 NOTE — Progress Notes (Signed)
Patient discharged home with daughter. Discharge instructions, prescriptions, and follow up appointment reviewed. Discussed smoking cessation with patient. Port access removed with no complications or discomfort. Daughter to transport home.

## 2016-08-05 NOTE — Care Management Important Message (Signed)
Important Message  Patient Details  Name: [name] MRN: [id number] Date of Birth: 07-12-1940   Medicare Important Message Given:  N/A - LOS <3 / Initial given by admissions    Katrina Stack, RN 08/05/2016, 1:34 PM

## 2016-08-05 NOTE — Progress Notes (Signed)
[facility] NAME: [name]    MR#:  [id number]  DATE OF BIRTH:  07-11-1940  DATE OF ADMISSION:  08/04/2016 ADMITTING PHYSICIAN: Henreitta Leber, MD  DATE OF DISCHARGE: 08/05/2016  PRIMARY CARE PHYSICIAN: Tower, Wynelle Fanny, MD    ADMISSION DIAGNOSIS:  Healthcare-associated pneumonia [J18.9] COPD exacerbation ([place]) [J44.1] Primary spontaneous pneumothorax [J93.11] Chest pain [R07.9]  DISCHARGE DIAGNOSIS:  Active Problems:   COPD exacerbation ([place])   SECONDARY DIAGNOSIS:   Past Medical History:  Diagnosis Date  . AAA (abdominal aortic aneurysm) (HCC)    4.2 cm IR AAA noted on 01/13/15 PET scan  . Anxiety   . CAD (coronary artery disease)    AMI 1997 w/ BMS x 2 LAD, BMS CFX 2002, DES x 2 LAD 2009, DES CFX & OM 2012   . Carotid artery disease ([place])    RICA CEA 4098, LICA CEA 1191 per Dr. [name],   . COPD (chronic obstructive pulmonary disease) ([place])   . GERD (gastroesophageal reflux disease)    occ  . HTN (hypertension)   . Hydradenitis   . Hyperlipidemia   . Ischemic cardiomyopathy    EF 40-45 percent at cath 2012  . Myocardial infarction ([place])   . Non-small cell lung cancer (NSCLC) ([place]) dx'd 12/2014   SBRT  . Osteoarthritis    pt. denies 01/17/2015  . PAC (premature atrial contraction)   . Peripheral vascular disease ([place])   . Pneumonia   . Prostate cancer ([place]) dx'd approx 2008   xrt throughfoley  . Radiation 02/18/15-02/25/15   right upper lung 54 Gy  . Shortness of breath dyspnea   . Stroke [facility]) 11-24-11   "no feeling in right 5th digit"  . Urinary frequency   . Urinary urgency     HOSPITAL COURSE:  76 year old male with a history of COPD and ongoing tobacco abuse, esophageal cancer with metastatic disease undergoing chemotherapy who presented with acute hypoxic respiratory failure.  1. Acute hypoxic respiratory failure in the setting of COPD exacerbation as well as community-acquired pneumonia. Patient  has been weaned off of oxygen.  2. COPD exacerbation: Patient has no wheezing on examination. He will be discharged with prednisone 50 mg for days and then will resume his home dose of prednisone which she states he is taking for appetite stimulation. He will continue home inhalers.  3. Pneumonia: Patient will continue on Levaquin.  4. Small pneumothorax: Chest x-ray today shows pneumothorax of 5-10% per patient is completely asymptomatic. Patient does not require any intervention at this time. Patient would benefit from a repeat chest x-ray on Monday.  4. GERD: Continue PPI    DISCHARGE CONDITIONS AND DIET:   Stable Regular idet  CONSULTS OBTAINED:    DRUG ALLERGIES:   Allergies  Allergen Reactions  . Augmentin [Amoxicillin-Pot Clavulanate] Diarrhea, Nausea And Vomiting and Other (See Comments)    Has patient had a PCN reaction causing immediate rash, facial/tongue/throat swelling, SOB or lightheadedness with hypotension: No Has patient had a PCN reaction causing severe rash involving mucus membranes or skin necrosis: No Has patient had a PCN reaction that required hospitalization No Has patient had a PCN reaction occurring within the last 10 years: Yes If all of the above answers are "NO", then may proceed with Cephalosporin use.   . Chantix [Varenicline] Other (See Comments)    Violent nightmares  . Sulfonamide Derivatives Other (See Comments)    Pt do not remember 8-30 md  .  Zantac [Ranitidine Hcl] Diarrhea    DISCHARGE MEDICATIONS:   Current Discharge Medication List    START taking these medications   Details  levofloxacin (LEVAQUIN) 750 MG tablet Take 1 tablet (750 mg total) by mouth daily. Qty: 5 tablet, Refills: 0      CONTINUE these medications which have CHANGED   Details  !! predniSONE (DELTASONE) 20 MG tablet Take 0.5 tablets (10 mg total) by mouth every other day. Please continue after you take 50 mg prednisone for 5 days To stimulate appetite    Associated Diagnoses: Cancer of lower third of esophagus ([place])    !! predniSONE (DELTASONE) 50 MG tablet Take 1 tablet (50 mg total) by mouth daily with breakfast. Qty: 4 tablet, Refills: 0     !! - Potential duplicate medications found. Please discuss with provider.    CONTINUE these medications which have NOT CHANGED   Details  acetaminophen (TYLENOL) 500 MG tablet Take 1,000 mg by mouth every 4 (four) hours as needed for mild pain, moderate pain, fever or headache.     ALPRAZolam (XANAX) 0.5 MG tablet Take 0.5-1 tablets (0.25-0.5 mg total) by mouth daily as needed for anxiety or sleep. Qty: 30 tablet, Refills: 0   Associated Diagnoses: Cancer of lower third of esophagus (HCC)    clopidogrel (PLAVIX) 75 MG tablet TAKE 1 TABLET BY MOUTH EVERY DAY Qty: 30 tablet, Refills: 7    diphenoxylate-atropine (LOMOTIL) 2.5-0.025 MG tablet Take 1-2 tablets by mouth 4 (four) times daily as needed for diarrhea or loose stools. 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 Qty: 30 tablet, Refills: 5    fluticasone-salmeterol (ADVAIR HFA) 115-21 MCG/ACT inhaler Inhale 2 puffs into the lungs 2 (two) times daily.    ipratropium-albuterol (DUONEB) 0.5-2.5 (3) MG/3ML SOLN Take 3 mLs by nebulization every 4 (four) hours as needed (shortness of breath and wheezing).    lidocaine-prilocaine (EMLA) cream Apply to port site one hour prior to use. Do not rub in. Cover with plastic. Qty: 30 g, Refills: 2    loratadine (CLARITIN) 10 MG tablet TAKE 1 TABLET BY MOUTH EVERY DAY Qty: 30 tablet, Refills: 5    mirtazapine (REMERON) 15 MG tablet TAKE 1 TABLET (15 MG TOTAL) BY MOUTH AT BEDTIME. Qty: 30 tablet, Refills: 6    Multiple Vitamin (MULTIVITAMIN WITH MINERALS) TABS tablet Take 1 tablet by mouth daily.    nitroGLYCERIN (NITROSTAT) 0.4 MG SL tablet Place 0.4 mg under the  tongue every 5 (five) minutes as needed for chest pain.    potassium chloride 20 MEQ/15ML (10%) SOLN Take 15 mLs (20 mEq total) by mouth daily. Qty: 450 mL, Refills: 0   Associated Diagnoses: Cancer of lower third of esophagus ([place]); Hypokalemia    prochlorperazine (COMPAZINE) 5 MG tablet Take 1 tablet (5 mg total) by mouth every 6 (six) hours as needed for nausea or vomiting. Qty: 30 tablet, Refills: 1   Associated Diagnoses: Cancer of lower third of esophagus (HCC)    tiotropium (SPIRIVA) 18 MCG inhalation capsule Place 1 capsule (18 mcg total) into inhaler and inhale daily. Qty: 30 capsule, Refills: 5    VENTOLIN HFA 108 (90 Base) MCG/ACT inhaler INHALE 2 PUFFS INTO THE LUNGS EVERY 4 (FOUR) HOURS AS NEEDED FOR WHEEZING OR SHORTNESS OF BREATH. Qty: 18 g, Refills: 5    lidocaine (XYLOCAINE) 2 % solution  Use as directed 20 mLs in the mouth or throat as needed for mouth pain.    omeprazole (PRILOSEC) 40 MG capsule TAKE 1 CAPSULE 30 MINUTES BEFORE BREAKFAST AND DINNER Qty: 180 capsule, Refills: 1   Associated Diagnoses: Esophageal dysphagia; Abnormal barium swallow; Abnormal CT scan, esophagus; Odynophagia    traMADol (ULTRAM) 50 MG tablet Take 1 tablet (50 mg total) by mouth every 6 (six) hours as needed. Qty: 50 tablet, Refills: 0   Associated Diagnoses: Cancer of lower third of esophagus ([place])          Today   CHIEF COMPLAINT:  Doing well  Feels so much better. Denies shortness of breath or wheezing.   VITAL SIGNS:  Blood pressure 120/63, pulse 86, temperature 98.7 F (37.1 C), temperature source Oral, resp. rate (!) 22, height '5\' 8"'$  (1.727 m), weight 55.6 kg (122 lb 9.6 oz), SpO2 94 %.   REVIEW OF SYSTEMS:  Review of Systems  Constitutional: Negative.  Negative for chills, fever and malaise/fatigue.  HENT: Negative.  Negative for ear discharge, ear pain, hearing loss, nosebleeds and sore throat.   Eyes: Negative.  Negative for blurred vision and pain.  Respiratory:  Negative.  Negative for cough, hemoptysis, shortness of breath and wheezing.   Cardiovascular: Negative.  Negative for chest pain, palpitations and leg swelling.  Gastrointestinal: Negative.  Negative for abdominal pain, blood in stool, diarrhea, nausea and vomiting.  Genitourinary: Negative.  Negative for dysuria.  Musculoskeletal: Negative.  Negative for back pain.  Skin: Negative.   Neurological: Negative for dizziness, tremors, speech change, focal weakness, seizures and headaches.  Endo/Heme/Allergies: Negative.  Does not bruise/bleed easily.  Psychiatric/Behavioral: Negative.  Negative for depression, hallucinations and suicidal ideas.     PHYSICAL EXAMINATION:  GENERAL:  76 y.o.-year-old patient lying in the bed with no acute distress.  NECK:  Supple, no jugular venous distention. No thyroid enlargement, no tenderness.  LUNGS: Normal breath sounds bilaterally, no wheezing, rales,rhonchi  No use of accessory muscles of respiration.  CARDIOVASCULAR: S1, S2 normal. No murmurs, rubs, or gallops.  ABDOMEN: Soft, non-tender, non-distended. Bowel sounds present. No organomegaly or mass.  EXTREMITIES: No pedal edema, cyanosis, or clubbing.  PSYCHIATRIC: The patient is alert and oriented x 3.  SKIN: No obvious rash, lesion, or ulcer.   DATA REVIEW:   CBC  Recent Labs Lab 08/05/16 0628  WBC 6.5  HGB 7.7*  HCT 22.9*  PLT 394    Chemistries   Recent Labs Lab 08/05/16 0628  NA 132*  K 3.5  CL 99*  CO2 24  GLUCOSE 183*  BUN 17  CREATININE 0.76  CALCIUM 8.5*    Cardiac Enzymes  Recent Labs Lab 08/04/16 1333  TROPONINI <0.03    Microbiology Results  '@[email or web address]'$ @  RADIOLOGY:  Dg Chest 1 View  Result Date: 08/05/2016 CLINICAL DATA:  Spontaneous left pneumothorax. EXAM: CHEST 1 VIEW COMPARISON:  CT of the chest on 08/04/2016 FINDINGS: Small left apical pneumothorax present of approximately 5-10 percent volume. 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  Airspace disease present at the right lung base with associated small right pleural effusion. No edema. Stable tortuosity of the thoracic aorta. Stable positioning of Port-A-Cath. IMPRESSION: 5-10% left-sided pneumothorax. Right basilar pneumonia with small right pleural effusion. Electronically Signed   By: Aletta Edouard M.D.   On: 08/05/2016 08:57   Ct Angio Chest Pe W Or Wo Contrast  Result Date: 08/04/2016 CLINICAL DATA:  Chest pain. Shortness of breath. History of esophageal cancer. EXAM: CT ANGIOGRAPHY  CHEST WITH CONTRAST TECHNIQUE: Multidetector CT imaging of the chest was performed using the standard protocol during bolus administration of intravenous contrast. Multiplanar CT image reconstructions and MIPs were obtained to evaluate the vascular anatomy. CONTRAST:  75 mL of Isovue 370 intravenously. COMPARISON:  CT scan of Jul 21, 2016. FINDINGS: Cardiovascular: Atherosclerosis of thoracic aorta is noted without aneurysm or dissection. There is no definite evidence of pulmonary embolus. Coronary artery calcifications are noted. Normal cardiac size. No pericardial effusion is noted. Mediastinum/Nodes: No enlarged mediastinal, hilar, or axillary lymph nodes. Thyroid gland, trachea, and esophagus demonstrate no significant findings. Lungs/Pleura: Mild left pneumothorax is noted. Mild right pleural effusion is noted. Right lower lobe atelectasis or pneumonia is noted. Stable 8 mm subpleural nodule is noted in left upper lobe. Stable 9 mm nodule is noted more medially in left upper lobe. Stable 4 mm right upper lobe nodule is noted. Stable density is seen laterally in right upper lobe most consistent with scarring. Bulla formation is noted in the right upper lobe. Upper Abdomen: Stable large left renal cyst. No other definite abnormality seen in visualized portion of upper abdomen. Musculoskeletal: Multiple sclerotic and lytic lesions are seen throughout the ribs and spine consistent with metastatic disease.  Review of the MIP images confirms the above findings. IMPRESSION: Aortic atherosclerosis. Coronary artery calcifications are noted suggesting coronary artery disease. No definite evidence of pulmonary embolus. Stable bilateral pulmonary nodules are noted concerning for metastatic disease. Stable osseous metastases. Mild right pleural effusion is noted. Right lower lobe atelectasis or pneumonia is noted. Interval development of mild left pneumothorax which is less than 10%. Critical Value/emergent results were called by telephone at the time of interpretation on 08/04/2016 at 3:03 pm to Dr. Rudene Re , who verbally acknowledged these results. Electronically Signed   By: Marijo Conception, M.D.   On: 08/04/2016 15:04      Current Discharge Medication List    START taking these medications   Details  levofloxacin (LEVAQUIN) 750 MG tablet Take 1 tablet (750 mg total) by mouth daily. Qty: 5 tablet, Refills: 0      CONTINUE these medications which have CHANGED   Details  !! predniSONE (DELTASONE) 20 MG tablet Take 0.5 tablets (10 mg total) by mouth every other day. Please continue after you take 50 mg prednisone for 5 days To stimulate appetite   Associated Diagnoses: Cancer of lower third of esophagus ([place])    !! predniSONE (DELTASONE) 50 MG tablet Take 1 tablet (50 mg total) by mouth daily with breakfast. Qty: 4 tablet, Refills: 0     !! - Potential duplicate medications found. Please discuss with provider.    CONTINUE these medications which have NOT CHANGED   Details  acetaminophen (TYLENOL) 500 MG tablet Take 1,000 mg by mouth every 4 (four) hours as needed for mild pain, moderate pain, fever or headache.     ALPRAZolam (XANAX) 0.5 MG tablet Take 0.5-1 tablets (0.25-0.5 mg total) by mouth daily as needed for anxiety or sleep. Qty: 30 tablet, Refills: 0   Associated Diagnoses: Cancer of lower third of esophagus (HCC)    clopidogrel (PLAVIX) 75 MG tablet TAKE 1 TABLET BY MOUTH EVERY  DAY Qty: 30 tablet, Refills: 7    diphenoxylate-atropine (LOMOTIL) 2.5-0.025 MG tablet Take 1-2 tablets by mouth 4 (four) times daily as needed for diarrhea or loose stools. Qty: 60 tablet, Refills: 0   Associated Diagnoses: Cancer of lower third of esophagus (HCC)    fludrocortisone (FLORINEF) 0.1 MG  tablet Take 1 tablet (0.1 mg total) by mouth daily. Qty: 30 tablet, Refills: 5    fluticasone-salmeterol (ADVAIR HFA) 115-21 MCG/ACT inhaler Inhale 2 puffs into the lungs 2 (two) times daily.    ipratropium-albuterol (DUONEB) 0.5-2.5 (3) MG/3ML SOLN Take 3 mLs by nebulization every 4 (four) hours as needed (shortness of breath and wheezing).    lidocaine-prilocaine (EMLA) cream Apply to port site one hour prior to use. Do not rub in. Cover with plastic. Qty: 30 g, Refills: 2    loratadine (CLARITIN) 10 MG tablet TAKE 1 TABLET BY MOUTH EVERY DAY Qty: 30 tablet, Refills: 5    mirtazapine (REMERON) 15 MG tablet TAKE 1 TABLET (15 MG TOTAL) BY MOUTH AT BEDTIME. Qty: 30 tablet, Refills: 6    Multiple Vitamin (MULTIVITAMIN WITH MINERALS) TABS tablet Take 1 tablet by mouth daily.    nitroGLYCERIN (NITROSTAT) 0.4 MG SL tablet Place 0.4 mg under the tongue every 5 (five) minutes as needed for chest pain.    potassium chloride 20 MEQ/15ML (10%) SOLN Take 15 mLs (20 mEq total) by mouth daily. Qty: 450 mL, Refills: 0   Associated Diagnoses: Cancer of lower third of esophagus ([place]); Hypokalemia    prochlorperazine (COMPAZINE) 5 MG tablet Take 1 tablet (5 mg total) by mouth every 6 (six) hours as needed for nausea or vomiting. Qty: 30 tablet, Refills: 1   Associated Diagnoses: Cancer of lower third of esophagus (HCC)    tiotropium (SPIRIVA) 18 MCG inhalation capsule Place 1 capsule (18 mcg total) into inhaler and inhale daily. Qty: 30 capsule, Refills: 5    VENTOLIN HFA 108 (90 Base) MCG/ACT inhaler INHALE 2 PUFFS INTO THE LUNGS EVERY 4 (FOUR) HOURS AS NEEDED FOR WHEEZING OR SHORTNESS OF  BREATH. Qty: 18 g, Refills: 5    lidocaine (XYLOCAINE) 2 % solution Use as directed 20 mLs in the mouth or throat as needed for mouth pain.    omeprazole (PRILOSEC) 40 MG capsule TAKE 1 CAPSULE 30 MINUTES BEFORE BREAKFAST AND DINNER Qty: 180 capsule, Refills: 1   Associated Diagnoses: Esophageal dysphagia; Abnormal barium swallow; Abnormal CT scan, esophagus; Odynophagia    traMADol (ULTRAM) 50 MG tablet Take 1 tablet (50 mg total) by mouth every 6 (six) hours as needed. Qty: 50 tablet, Refills: 0   Associated Diagnoses: Cancer of lower third of esophagus ([place])          Management plans discussed with the patient and he is in agreement. Stable for discharge home  Patient should follow up with pcp  CODE STATUS:     Code Status Orders        Start     Ordered   08/04/16 1851  Full code  Continuous     08/04/16 1850    Code Status History    Date Active Date Inactive Code Status Order ID Comments User Context   01/23/2016  5:30 PM 01/25/2016  4:48 PM Full Code [id number]  Epifanio Lesches, MD ED   11/20/2015 12:33 AM 11/20/2015  7:59 PM Full Code [id number]  Hugelmeyer, Walford, DO Inpatient   10/30/2015  2:53 PM 11/01/2015  5:14 PM Full Code [id number]  Thurnell Lose, MD Inpatient   11/24/2011  3:30 PM 11/27/2011  1:13 PM Full Code [id number]  Lucienne Capers, RN Inpatient    Advance Directive Documentation     Most Recent Value  Type of Advance Directive  Healthcare Power of Attorney  Pre-existing out of facility DNR order (yellow form or pink MOST form)  -  "  MOST" Form in Place?  -      TOTAL TIME TAKING CARE OF THIS PATIENT: 37 minutes.    Note: This dictation was prepared with Dragon dictation along with smaller phrase technology. Any transcriptional errors that result from this process are unintentional.  Jahlon Baines M.D on 08/05/2016 at 10:09 AM  Between 7am to 6pm - Pager - [phone] After 6pm go to [email or web address] - password [email or web address]  Office  [phone]  CC: Primary care physician; Tower, Wynelle Fanny, MD

## 2016-08-05 NOTE — Discharge Summary (Signed)
[facility] [place] NAME: [name]    MR#:  [id number]  DATE OF BIRTH:  [birth year]  DATE OF ADMISSION:  08/04/2016    ADMITTING PHYSICIAN: Henreitta Leber, MD  DATE OF DISCHARGE: 08/05/2016  PRIMARY CARE PHYSICIAN: Tower, Wynelle Fanny, MD    ADMISSION DIAGNOSIS:  Healthcare-associated pneumonia [J18.9] COPD exacerbation ([place]) [J44.1] Primary spontaneous pneumothorax [J93.11] Chest pain [R07.9]  DISCHARGE DIAGNOSIS:  Active Problems:   COPD exacerbation ([place])   SECONDARY DIAGNOSIS:       Past Medical History:  Diagnosis Date  . AAA (abdominal aortic aneurysm) (HCC)    4.2 cm IR AAA noted on 01/13/15 PET scan  . Anxiety   . CAD (coronary artery disease)    AMI 1997 w/ BMS x 2 LAD, BMS CFX 2002, DES x 2 LAD 2009, DES CFX & OM 2012   . Carotid artery disease ([place])    RICA CEA 3235, LICA CEA 5732 per Dr. [name],   . COPD (chronic obstructive pulmonary disease) ([place])   . GERD (gastroesophageal reflux disease)    occ  . HTN (hypertension)   . Hydradenitis   . Hyperlipidemia   . Ischemic cardiomyopathy    EF 40-45 percent at cath 2012  . Myocardial infarction ([place])   . Non-small cell lung cancer (NSCLC) ([place]) dx'd 12/2014   SBRT  . Osteoarthritis    pt. denies 01/17/2015  . PAC (premature atrial contraction)   . Peripheral vascular disease ([place])   . Pneumonia   . Prostate cancer ([place]) dx'd approx 2008   xrt throughfoley  . Radiation 02/18/15-02/25/15   right upper lung 54 Gy  . Shortness of breath dyspnea   . Stroke [facility]) 11-24-11   "no feeling in right 5th digit"  . Urinary frequency   . Urinary urgency     HOSPITAL COURSE:  76 year old male with a history of COPD and ongoing tobacco abuse, esophageal cancer with metastatic disease undergoing chemotherapy who presented with acute hypoxic respiratory failure.  1. Acute hypoxic respiratory failure in the setting of COPD  exacerbation as well as community-acquired pneumonia. Patient has been weaned off of oxygen.  2. COPD exacerbation: Patient has no wheezing on examination. He will be discharged with prednisone 50 mg for days and then will resume his home dose of prednisone which she states he is taking for appetite stimulation. He will continue home inhalers.  3. Pneumonia: Patient will continue on Levaquin.  4. Small pneumothorax: Chest x-ray today shows pneumothorax of 5-10% per patient is completely asymptomatic. Patient does not require any intervention at this time. Patient would benefit from a repeat chest x-ray on Monday.  4. GERD: Continue PPI    DISCHARGE CONDITIONS AND DIET:   Stable Regular idet  CONSULTS OBTAINED:   DRUG ALLERGIES:        Allergies  Allergen Reactions  . Augmentin [Amoxicillin-Pot Clavulanate] Diarrhea, Nausea And Vomiting and Other (See Comments)    Has patient had a PCN reaction causing immediate rash, facial/tongue/throat swelling, SOB or lightheadedness with hypotension: No Has patient had a PCN reaction causing severe rash involving mucus membranes or skin necrosis: No Has patient had a PCN reaction that required hospitalization No Has patient had a PCN reaction occurring within the last 10 years: Yes If all of the above answers are "NO", then may proceed with Cephalosporin use.   . Chantix [Varenicline] Other (See Comments)    Violent nightmares  . Sulfonamide Derivatives Other (  See Comments)    Pt do not remember 8-30 md  . Zantac [Ranitidine Hcl] Diarrhea    DISCHARGE MEDICATIONS:       Current Discharge Medication List        START taking these medications   Details  levofloxacin (LEVAQUIN) 750 MG tablet Take 1 tablet (750 mg total) by mouth daily. Qty: 5 tablet, Refills: 0          CONTINUE these medications which have CHANGED   Details  !! predniSONE (DELTASONE) 20 MG tablet Take 0.5 tablets (10 mg total) by  mouth every other day. Please continue after you take 50 mg prednisone for 5 days To stimulate appetite   Associated Diagnoses: Cancer of lower third of esophagus ([place])    !! predniSONE (DELTASONE) 50 MG tablet Take 1 tablet (50 mg total) by mouth daily with breakfast. Qty: 4 tablet, Refills: 0     !! - Potential duplicate medications found. Please discuss with provider.        CONTINUE these medications which have NOT CHANGED   Details  acetaminophen (TYLENOL) 500 MG tablet Take 1,000 mg by mouth every 4 (four) hours as needed for mild pain, moderate pain, fever or headache.     ALPRAZolam (XANAX) 0.5 MG tablet Take 0.5-1 tablets (0.25-0.5 mg total) by mouth daily as needed for anxiety or sleep. Qty: 30 tablet, Refills: 0   Associated Diagnoses: Cancer of lower third of esophagus (HCC)    clopidogrel (PLAVIX) 75 MG tablet TAKE 1 TABLET BY MOUTH EVERY DAY Qty: 30 tablet, Refills: 7    diphenoxylate-atropine (LOMOTIL) 2.5-0.025 MG tablet Take 1-2 tablets by mouth 4 (four) times daily as needed for diarrhea or loose stools. 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 Qty: 30 tablet, Refills: 5    fluticasone-salmeterol (ADVAIR HFA) 115-21 MCG/ACT inhaler Inhale 2 puffs into the lungs 2 (two) times daily.    ipratropium-albuterol (DUONEB) 0.5-2.5 (3) MG/3ML SOLN Take 3 mLs by nebulization every 4 (four) hours as needed (shortness of breath and wheezing).    lidocaine-prilocaine (EMLA) cream Apply to port site one hour prior to use. Do not rub in. Cover with plastic. Qty: 30 g, Refills: 2    loratadine (CLARITIN) 10 MG tablet TAKE 1 TABLET BY MOUTH EVERY DAY Qty: 30 tablet, Refills: 5    mirtazapine (REMERON) 15 MG tablet TAKE 1 TABLET (15 MG TOTAL) BY MOUTH AT BEDTIME. Qty: 30 tablet, Refills: 6    Multiple Vitamin  (MULTIVITAMIN WITH MINERALS) TABS tablet Take 1 tablet by mouth daily.    nitroGLYCERIN (NITROSTAT) 0.4 MG SL tablet Place 0.4 mg under the tongue every 5 (five) minutes as needed for chest pain.    potassium chloride 20 MEQ/15ML (10%) SOLN Take 15 mLs (20 mEq total) by mouth daily. Qty: 450 mL, Refills: 0   Associated Diagnoses: Cancer of lower third of esophagus ([place]); Hypokalemia    prochlorperazine (COMPAZINE) 5 MG tablet Take 1 tablet (5 mg total) by mouth every 6 (six) hours as needed for nausea or vomiting. Qty: 30 tablet, Refills: 1   Associated Diagnoses: Cancer of lower third of esophagus (HCC)    tiotropium (SPIRIVA) 18 MCG inhalation capsule Place 1 capsule (18 mcg total) into inhaler and inhale daily. Qty: 30 capsule, Refills: 5    VENTOLIN HFA 108 (90 Base) MCG/ACT inhaler INHALE 2  PUFFS INTO THE LUNGS EVERY 4 (FOUR) HOURS AS NEEDED FOR WHEEZING OR SHORTNESS OF BREATH. Qty: 18 g, Refills: 5    lidocaine (XYLOCAINE) 2 % solution Use as directed 20 mLs in the mouth or throat as needed for mouth pain.    omeprazole (PRILOSEC) 40 MG capsule TAKE 1 CAPSULE 30 MINUTES BEFORE BREAKFAST AND DINNER Qty: 180 capsule, Refills: 1   Associated Diagnoses: Esophageal dysphagia; Abnormal barium swallow; Abnormal CT scan, esophagus; Odynophagia    traMADol (ULTRAM) 50 MG tablet Take 1 tablet (50 mg total) by mouth every 6 (six) hours as needed. Qty: 50 tablet, Refills: 0   Associated Diagnoses: Cancer of lower third of esophagus ([place])          Today   CHIEF COMPLAINT:  Doing well  Feels so much better. Denies shortness of breath or wheezing.   VITAL SIGNS:  Blood pressure 120/63, pulse 86, temperature 98.7 F (37.1 C), temperature source Oral, resp. rate (!) 22, height '5\' 8"'$  (1.727 m), weight 55.6 kg (122 lb 9.6 oz), SpO2 94 %.   REVIEW OF SYSTEMS:  Review of Systems  Constitutional: Negative.  Negative for chills, fever and malaise/fatigue.   HENT: Negative.  Negative for ear discharge, ear pain, hearing loss, nosebleeds and sore throat.   Eyes: Negative.  Negative for blurred vision and pain.  Respiratory: Negative.  Negative for cough, hemoptysis, shortness of breath and wheezing.   Cardiovascular: Negative.  Negative for chest pain, palpitations and leg swelling.  Gastrointestinal: Negative.  Negative for abdominal pain, blood in stool, diarrhea, nausea and vomiting.  Genitourinary: Negative.  Negative for dysuria.  Musculoskeletal: Negative.  Negative for back pain.  Skin: Negative.   Neurological: Negative for dizziness, tremors, speech change, focal weakness, seizures and headaches.  Endo/Heme/Allergies: Negative.  Does not bruise/bleed easily.  Psychiatric/Behavioral: Negative.  Negative for depression, hallucinations and suicidal ideas.     PHYSICAL EXAMINATION:  GENERAL:  76 y.o.-year-old patient lying in the bed with no acute distress.  NECK:  Supple, no jugular venous distention. No thyroid enlargement, no tenderness.  LUNGS: Normal breath sounds bilaterally, no wheezing, rales,rhonchi  No use of accessory muscles of respiration.  CARDIOVASCULAR: S1, S2 normal. No murmurs, rubs, or gallops.  ABDOMEN: Soft, non-tender, non-distended. Bowel sounds present. No organomegaly or mass.  EXTREMITIES: No pedal edema, cyanosis, or clubbing.  PSYCHIATRIC: The patient is alert and oriented x 3.  SKIN: No obvious rash, lesion, or ulcer.   DATA REVIEW:   CBC  Last Labs    Recent Labs Lab 08/05/16 0628  WBC 6.5  HGB 7.7*  HCT 22.9*  PLT 394      Chemistries   Last Labs    Recent Labs Lab 08/05/16 0628  NA 132*  K 3.5  CL 99*  CO2 24  GLUCOSE 183*  BUN 17  CREATININE 0.76  CALCIUM 8.5*      Cardiac Enzymes  Last Labs    Recent Labs Lab 08/04/16 1333  [place] <0.03      Microbiology Results  '@MICRORSLT48'$ @  RADIOLOGY:   Imaging Results (Last 48 hours)  Dg Chest 1  View  Result Date: 08/05/2016 CLINICAL DATA:  Spontaneous left pneumothorax. EXAM: CHEST 1 VIEW COMPARISON:  CT of the chest on 08/04/2016 FINDINGS: Small left apical pneumothorax present of approximately 5-10 percent volume. 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 Airspace disease present at the right lung base with associated small right pleural effusion. No edema. Stable tortuosity of the thoracic aorta. Stable positioning  of Port-A-Cath. IMPRESSION: 5-10% left-sided pneumothorax. Right basilar pneumonia with small right pleural effusion. Electronically Signed   By: Aletta Edouard M.D.   On: 08/05/2016 08:57   Ct Angio Chest Pe W Or Wo Contrast  Result Date: 08/04/2016 CLINICAL DATA:  Chest pain. Shortness of breath. History of esophageal cancer. EXAM: CT ANGIOGRAPHY CHEST WITH CONTRAST TECHNIQUE: Multidetector CT imaging of the chest was performed using the standard protocol during bolus administration of intravenous contrast. Multiplanar CT image reconstructions and MIPs were obtained to evaluate the vascular anatomy. CONTRAST:  75 mL of Isovue 370 intravenously. COMPARISON:  CT scan of Jul 21, 2016. FINDINGS: Cardiovascular: Atherosclerosis of thoracic aorta is noted without aneurysm or dissection. There is no definite evidence of pulmonary embolus. Coronary artery calcifications are noted. Normal cardiac size. No pericardial effusion is noted. Mediastinum/Nodes: No enlarged mediastinal, hilar, or axillary lymph nodes. Thyroid gland, trachea, and esophagus demonstrate no significant findings. Lungs/Pleura: Mild left pneumothorax is noted. Mild right pleural effusion is noted. Right lower lobe atelectasis or pneumonia is noted. Stable 8 mm subpleural nodule is noted in left upper lobe. Stable 9 mm nodule is noted more medially in left upper lobe. Stable 4 mm right upper lobe nodule is noted. Stable density is seen laterally in right upper lobe most consistent with scarring. Bulla formation is  noted in the right upper lobe. Upper Abdomen: Stable large left renal cyst. No other definite abnormality seen in visualized portion of upper abdomen. Musculoskeletal: Multiple sclerotic and lytic lesions are seen throughout the ribs and spine consistent with metastatic disease. Review of the MIP images confirms the above findings. IMPRESSION: Aortic atherosclerosis. Coronary artery calcifications are noted suggesting coronary artery disease. No definite evidence of pulmonary embolus. Stable bilateral pulmonary nodules are noted concerning for metastatic disease. Stable osseous metastases. Mild right pleural effusion is noted. Right lower lobe atelectasis or pneumonia is noted. Interval development of mild left pneumothorax which is less than 10%. Critical Value/emergent results were called by telephone at the time of interpretation on 08/04/2016 at 3:03 pm to Dr. Rudene Re , who verbally acknowledged these results. Electronically Signed   By: Marijo Conception, M.D.   On: 08/04/2016 15:04           Current Discharge Medication List        START taking these medications   Details  levofloxacin (LEVAQUIN) 750 MG tablet Take 1 tablet (750 mg total) by mouth daily. Qty: 5 tablet, Refills: 0          CONTINUE these medications which have CHANGED   Details  !! predniSONE (DELTASONE) 20 MG tablet Take 0.5 tablets (10 mg total) by mouth every other day. Please continue after you take 50 mg prednisone for 5 days To stimulate appetite   Associated Diagnoses: Cancer of lower third of esophagus ([place])    !! predniSONE (DELTASONE) 50 MG tablet Take 1 tablet (50 mg total) by mouth daily with breakfast. Qty: 4 tablet, Refills: 0     !! - Potential duplicate medications found. Please discuss with provider.        CONTINUE these medications which have NOT CHANGED   Details  acetaminophen (TYLENOL) 500 MG tablet Take 1,000 mg by mouth every 4 (four) hours as needed for mild pain,  moderate pain, fever or headache.     ALPRAZolam (XANAX) 0.5 MG tablet Take 0.5-1 tablets (0.25-0.5 mg total) by mouth daily as needed for anxiety or sleep. Qty: 30 tablet, Refills: 0   Associated Diagnoses:  Cancer of lower third of esophagus (HCC)    clopidogrel (PLAVIX) 75 MG tablet TAKE 1 TABLET BY MOUTH EVERY DAY Qty: 30 tablet, Refills: 7    diphenoxylate-atropine (LOMOTIL) 2.5-0.025 MG tablet Take 1-2 tablets by mouth 4 (four) times daily as needed for diarrhea or loose stools. 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 Qty: 30 tablet, Refills: 5    fluticasone-salmeterol (ADVAIR HFA) 115-21 MCG/ACT inhaler Inhale 2 puffs into the lungs 2 (two) times daily.    ipratropium-albuterol (DUONEB) 0.5-2.5 (3) MG/3ML SOLN Take 3 mLs by nebulization every 4 (four) hours as needed (shortness of breath and wheezing).    lidocaine-prilocaine (EMLA) cream Apply to port site one hour prior to use. Do not rub in. Cover with plastic. Qty: 30 g, Refills: 2    loratadine (CLARITIN) 10 MG tablet TAKE 1 TABLET BY MOUTH EVERY DAY Qty: 30 tablet, Refills: 5    mirtazapine (REMERON) 15 MG tablet TAKE 1 TABLET (15 MG TOTAL) BY MOUTH AT BEDTIME. Qty: 30 tablet, Refills: 6    Multiple Vitamin (MULTIVITAMIN WITH MINERALS) TABS tablet Take 1 tablet by mouth daily.    nitroGLYCERIN (NITROSTAT) 0.4 MG SL tablet Place 0.4 mg under the tongue every 5 (five) minutes as needed for chest pain.    potassium chloride 20 MEQ/15ML (10%) SOLN Take 15 mLs (20 mEq total) by mouth daily. Qty: 450 mL, Refills: 0   Associated Diagnoses: Cancer of lower third of esophagus ([place]); Hypokalemia    prochlorperazine (COMPAZINE) 5 MG tablet Take 1 tablet (5 mg total) by mouth every 6 (six) hours as needed for nausea or vomiting. Qty: 30 tablet, Refills: 1   Associated  Diagnoses: Cancer of lower third of esophagus (HCC)    tiotropium (SPIRIVA) 18 MCG inhalation capsule Place 1 capsule (18 mcg total) into inhaler and inhale daily. Qty: 30 capsule, Refills: 5    VENTOLIN HFA 108 (90 Base) MCG/ACT inhaler INHALE 2 PUFFS INTO THE LUNGS EVERY 4 (FOUR) HOURS AS NEEDED FOR WHEEZING OR SHORTNESS OF BREATH. Qty: 18 g, Refills: 5    lidocaine (XYLOCAINE) 2 % solution Use as directed 20 mLs in the mouth or throat as needed for mouth pain.    omeprazole (PRILOSEC) 40 MG capsule TAKE 1 CAPSULE 30 MINUTES BEFORE BREAKFAST AND DINNER Qty: 180 capsule, Refills: 1   Associated Diagnoses: Esophageal dysphagia; Abnormal barium swallow; Abnormal CT scan, esophagus; Odynophagia    traMADol (ULTRAM) 50 MG tablet Take 1 tablet (50 mg total) by mouth every 6 (six) hours as needed. Qty: 50 tablet, Refills: 0   Associated Diagnoses: Cancer of lower third of esophagus ([place])          Management plans discussed with the patient and he is in agreement. Stable for discharge home  Patient should follow up with pcp  CODE STATUS:           Code Status Orders              Start     Ordered   08/04/16 1851  Full code  Continuous     08/04/16 1850                      Code Status History    Date Active Date Inactive Code Status Order ID Comments User Context   01/23/2016  5:30  PM 01/25/2016  4:48 PM Full Code [id number]  Epifanio Lesches, MD ED   11/20/2015 12:33 AM 11/20/2015  7:59 PM Full Code [id number]  Harvie Bridge, DO Inpatient   10/30/2015  2:53 PM 11/01/2015  5:14 PM Full Code [id number]  Thurnell Lose, MD Inpatient   11/24/2011  3:30 PM 11/27/2011  1:13 PM Full Code [id number]  Lucienne Capers, RN Inpatient       Advance Directive Documentation     Most Recent Value  Type of Advance Directive  Healthcare Power of Attorney  Pre-existing out of facility DNR order (yellow form or pink MOST form)  -  "MOST" Form in Place?  -       TOTAL TIME TAKING CARE OF THIS PATIENT: 37 minutes.    Note: This dictation was prepared with Dragon dictation along with smaller phrase technology. Any transcriptional errors that result from this process are unintentional.  Sulma Ruffino M.D on 08/05/2016 at 10:09 AM  Between 7am to 6pm - Pager - [phone] After 6pm go to [email or web address] - password [email or web address] [facility]  Office  [phone]  CC: Primary care physician; Tower, Wynelle Fanny, MD

## 2016-08-05 NOTE — Telephone Encounter (Signed)
done

## 2016-08-06 ENCOUNTER — Telehealth: Payer: Self-pay | Admitting: *Deleted

## 2016-08-06 ENCOUNTER — Other Ambulatory Visit: Payer: Self-pay | Admitting: Nurse Practitioner

## 2016-08-06 DIAGNOSIS — E876 Hypokalemia: Secondary | ICD-10-CM

## 2016-08-06 DIAGNOSIS — C155 Malignant neoplasm of lower third of esophagus: Secondary | ICD-10-CM

## 2016-08-06 MED ORDER — POTASSIUM CHLORIDE 20 MEQ/15ML (10%) PO SOLN: 20.0000 meq | Freq: Every day | ORAL | 0 refills | Status: AC

## 2016-08-06 NOTE — Telephone Encounter (Signed)
Spoke to pt and attempted to complete TCM. Pt reqeusted a call back on Monday

## 2016-08-06 NOTE — Telephone Encounter (Signed)
Call from pt's daughter with update. Pt was admitted to [facility] with pneumonia. Daughter wants to make Dr. Benay Spice aware of admission. "Asks, where do we go from here? I know chemo is keeping things stable but he has no quality of life." Daughter would like to speak with provider without pt present so she can ask the hard questions. "If he only has a month left, we want to spend it doing something different." Pt is scheduled for chemo 5/22. Office visit 6/5.  Message to MD.

## 2016-08-07 ENCOUNTER — Observation Stay
Admission: EM | Admit: 2016-08-07 | Discharge: 2016-08-09 | Disposition: A | Discharge status: Home / Self Care | Payer: Medicare Other | Attending: Internal Medicine | Admitting: Internal Medicine

## 2016-08-07 ENCOUNTER — Encounter: Payer: Self-pay | Admitting: Emergency Medicine

## 2016-08-07 ENCOUNTER — Emergency Department: Payer: Medicare Other

## 2016-08-07 DIAGNOSIS — I4892 Unspecified atrial flutter: Secondary | ICD-10-CM | POA: Diagnosis not present

## 2016-08-07 DIAGNOSIS — I110 Hypertensive heart disease with heart failure: Secondary | ICD-10-CM | POA: Insufficient documentation

## 2016-08-07 DIAGNOSIS — I255 Ischemic cardiomyopathy: Secondary | ICD-10-CM | POA: Insufficient documentation

## 2016-08-07 DIAGNOSIS — Z955 Presence of coronary angioplasty implant and graft: Secondary | ICD-10-CM | POA: Insufficient documentation

## 2016-08-07 DIAGNOSIS — I2510 Atherosclerotic heart disease of native coronary artery without angina pectoris: Secondary | ICD-10-CM | POA: Insufficient documentation

## 2016-08-07 DIAGNOSIS — J90 Pleural effusion, not elsewhere classified: Secondary | ICD-10-CM | POA: Diagnosis not present

## 2016-08-07 DIAGNOSIS — C155 Malignant neoplasm of lower third of esophagus: Secondary | ICD-10-CM | POA: Diagnosis not present

## 2016-08-07 DIAGNOSIS — I5022 Chronic systolic (congestive) heart failure: Secondary | ICD-10-CM | POA: Diagnosis not present

## 2016-08-07 DIAGNOSIS — Z880 Allergy status to penicillin: Secondary | ICD-10-CM | POA: Insufficient documentation

## 2016-08-07 DIAGNOSIS — Z85118 Personal history of other malignant neoplasm of bronchus and lung: Secondary | ICD-10-CM | POA: Diagnosis not present

## 2016-08-07 DIAGNOSIS — J189 Pneumonia, unspecified organism: Secondary | ICD-10-CM | POA: Diagnosis present

## 2016-08-07 DIAGNOSIS — Z882 Allergy status to sulfonamides status: Secondary | ICD-10-CM | POA: Diagnosis not present

## 2016-08-07 DIAGNOSIS — Z79899 Other long term (current) drug therapy: Secondary | ICD-10-CM | POA: Diagnosis not present

## 2016-08-07 DIAGNOSIS — E876 Hypokalemia: Principal | ICD-10-CM | POA: Insufficient documentation

## 2016-08-07 DIAGNOSIS — E860 Dehydration: Secondary | ICD-10-CM | POA: Diagnosis not present

## 2016-08-07 DIAGNOSIS — Z7902 Long term (current) use of antithrombotics/antiplatelets: Secondary | ICD-10-CM | POA: Insufficient documentation

## 2016-08-07 DIAGNOSIS — R42 Dizziness and giddiness: Secondary | ICD-10-CM | POA: Diagnosis not present

## 2016-08-07 DIAGNOSIS — R531 Weakness: Secondary | ICD-10-CM | POA: Diagnosis not present

## 2016-08-07 DIAGNOSIS — J449 Chronic obstructive pulmonary disease, unspecified: Secondary | ICD-10-CM | POA: Diagnosis present

## 2016-08-07 DIAGNOSIS — D649 Anemia, unspecified: Secondary | ICD-10-CM | POA: Insufficient documentation

## 2016-08-07 DIAGNOSIS — Z8546 Personal history of malignant neoplasm of prostate: Secondary | ICD-10-CM | POA: Insufficient documentation

## 2016-08-07 DIAGNOSIS — I10 Essential (primary) hypertension: Secondary | ICD-10-CM | POA: Diagnosis not present

## 2016-08-07 DIAGNOSIS — I959 Hypotension, unspecified: Secondary | ICD-10-CM | POA: Insufficient documentation

## 2016-08-07 DIAGNOSIS — R55 Syncope and collapse: Secondary | ICD-10-CM | POA: Insufficient documentation

## 2016-08-07 DIAGNOSIS — Z923 Personal history of irradiation: Secondary | ICD-10-CM | POA: Insufficient documentation

## 2016-08-07 LAB — COMPREHENSIVE METABOLIC PANEL
ALBUMIN: 2.9 g/dL — AB (ref 3.5–5.0)
ALK PHOS: 79 U/L (ref 38–126)
ALT: 10 U/L — ABNORMAL LOW (ref 17–63)
ANION GAP: 12 (ref 5–15)
AST: 23 U/L (ref 15–41)
BUN: 15 mg/dL (ref 6–20)
CALCIUM: 8.5 mg/dL — AB (ref 8.9–10.3)
CHLORIDE: 97 mmol/L — AB (ref 101–111)
CO2: 26 mmol/L (ref 22–32)
Creatinine, Ser: 1.01 mg/dL (ref 0.61–1.24)
GFR calc Af Amer: >60 mL/min (ref >60)
GFR calc non Af Amer: >60 mL/min (ref >60)
GLUCOSE: 109 mg/dL — AB (ref 65–99)
POTASSIUM: 3.4 mmol/L — AB (ref 3.5–5.1)
SODIUM: 135 mmol/L (ref 135–145)
Total Bilirubin: 0.4 mg/dL (ref 0.3–1.2)
Total Protein: 6.3 g/dL — ABNORMAL LOW (ref 6.5–8.1)

## 2016-08-07 LAB — URINALYSIS, COMPLETE (UACMP) WITH MICROSCOPIC
BACTERIA UA: NONE SEEN
Bilirubin Urine: NEGATIVE
Glucose, UA: NEGATIVE mg/dL
Hgb urine dipstick: NEGATIVE
KETONES UR: NEGATIVE mg/dL
LEUKOCYTES UA: NEGATIVE
Nitrite: NEGATIVE
PROTEIN: NEGATIVE mg/dL
SQUAMOUS EPITHELIAL / LPF: NONE SEEN
Specific Gravity, Urine: 1.017 (ref 1.005–1.030)
pH: 5 (ref 5.0–8.0)

## 2016-08-07 LAB — LACTIC ACID, PLASMA
Lactic Acid, Venous: 2.9 mmol/L (ref 0.5–1.9)
Lactic Acid, Venous: 0.7 mmol/L (ref 0.5–1.9)

## 2016-08-07 LAB — TROPONIN I: Troponin I: <0.03 ng/mL (ref <0.03)

## 2016-08-07 LAB — CBC WITH DIFFERENTIAL/PLATELET
Basophils Absolute: 0 10*3/uL (ref 0–0.1)
Basophils Relative: 0 %
Eosinophils Absolute: 0 10*3/uL (ref 0–0.7)
Eosinophils Relative: 0 %
HEMATOCRIT: 26.3 % — AB (ref 40.0–52.0)
HEMOGLOBIN: 8.6 g/dL — AB (ref 13.0–18.0)
LYMPHS ABS: 0.4 10*3/uL — AB (ref 1.0–3.6)
LYMPHS PCT: 7 %
MCH: 28.3 pg (ref 26.0–34.0)
MCHC: 32.8 g/dL (ref 32.0–36.0)
MCV: 86.2 fL (ref 80.0–100.0)
Monocytes Absolute: 0.1 10*3/uL — ABNORMAL LOW (ref 0.2–1.0)
Monocytes Relative: 2 %
NEUTROS ABS: 6.2 10*3/uL (ref 1.4–6.5)
NEUTROS PCT: 91 %
Platelets: 481 10*3/uL — ABNORMAL HIGH (ref 150–440)
RBC: 3.05 MIL/uL — AB (ref 4.40–5.90)
RDW: 22.8 % — ABNORMAL HIGH (ref 11.5–14.5)
WBC: 6.8 10*3/uL (ref 3.8–10.6)

## 2016-08-07 LAB — MAGNESIUM: Magnesium: 1.5 mg/dL — ABNORMAL LOW (ref 1.7–2.4)

## 2016-08-07 LAB — CK: Total CK: 13 U/L — ABNORMAL LOW (ref 49–397)

## 2016-08-07 LAB — PHOSPHORUS: PHOSPHORUS: 3.4 mg/dL (ref 2.5–4.6)

## 2016-08-07 LAB — LIPASE, BLOOD: Lipase: 17 U/L (ref 11–51)

## 2016-08-07 MED ORDER — IPRATROPIUM-ALBUTEROL 0.5-2.5 (3) MG/3ML IN SOLN: 3.0000 mL | RESPIRATORY_TRACT | Status: DC | PRN

## 2016-08-07 MED ORDER — ONDANSETRON HCL 4 MG/2ML IJ SOLN: 4.0000 mg | Freq: Four times a day (QID) | INTRAMUSCULAR | Status: DC | PRN

## 2016-08-07 MED ORDER — CLOPIDOGREL BISULFATE 75 MG PO TABS
75.0000 mg | ORAL_TABLET | Freq: Every day | ORAL | Status: DC
  Administered 2016-08-08 – 2016-08-09 (×2): 75 mg via ORAL
  Filled 2016-08-07 (×2): qty 1

## 2016-08-07 MED ORDER — SODIUM CHLORIDE 0.9 % IV SOLN
INTRAVENOUS | Status: DC
  Administered 2016-08-07: via INTRAVENOUS

## 2016-08-07 MED ORDER — FLUDROCORTISONE ACETATE 0.1 MG PO TABS
0.1000 mg | ORAL_TABLET | Freq: Every day | ORAL | Status: DC
  Administered 2016-08-07 – 2016-08-09 (×3): 0.1 mg via ORAL
  Filled 2016-08-07 (×3): qty 1

## 2016-08-07 MED ORDER — ENOXAPARIN SODIUM 40 MG/0.4ML ~~LOC~~ SOLN
40.0000 mg | SUBCUTANEOUS | Status: DC
  Administered 2016-08-08: 21:00:00 40 mg via SUBCUTANEOUS
  Filled 2016-08-07: qty 0.4

## 2016-08-07 MED ORDER — ACETAMINOPHEN 325 MG PO TABS
650.0000 mg | ORAL_TABLET | Freq: Four times a day (QID) | ORAL | Status: DC | PRN
  Administered 2016-08-09: 01:00:00 650 mg via ORAL
  Filled 2016-08-07: qty 2

## 2016-08-07 MED ORDER — SODIUM CHLORIDE 0.9 % IV BOLUS (SEPSIS)
500.0000 mL | Freq: Once | INTRAVENOUS | Status: AC
  Administered 2016-08-07: 500 mL via INTRAVENOUS

## 2016-08-07 MED ORDER — ONDANSETRON HCL 4 MG PO TABS: 4.0000 mg | ORAL_TABLET | Freq: Four times a day (QID) | ORAL | Status: DC | PRN

## 2016-08-07 MED ORDER — MAGNESIUM SULFATE 2 GM/50ML IV SOLN
2.0000 g | Freq: Once | INTRAVENOUS | Status: AC
  Administered 2016-08-07: 2 g via INTRAVENOUS
  Filled 2016-08-07: qty 50

## 2016-08-07 MED ORDER — TIOTROPIUM BROMIDE MONOHYDRATE 18 MCG IN CAPS
18.0000 ug | ORAL_CAPSULE | Freq: Every day | RESPIRATORY_TRACT | Status: DC
  Administered 2016-08-07 – 2016-08-09 (×3): 18 ug via RESPIRATORY_TRACT
  Filled 2016-08-07: qty 5

## 2016-08-07 MED ORDER — SODIUM CHLORIDE 0.9 % IV BOLUS (SEPSIS)
1000.0000 mL | Freq: Once | INTRAVENOUS | Status: AC
  Administered 2016-08-07: 1000 mL via INTRAVENOUS

## 2016-08-07 MED ORDER — MOMETASONE FURO-FORMOTEROL FUM 200-5 MCG/ACT IN AERO
2.0000 | INHALATION_SPRAY | Freq: Two times a day (BID) | RESPIRATORY_TRACT | Status: DC
  Administered 2016-08-07 – 2016-08-09 (×4): 2 via RESPIRATORY_TRACT
  Filled 2016-08-07: qty 8.8

## 2016-08-07 MED ORDER — ONDANSETRON HCL 4 MG/2ML IJ SOLN
4.0000 mg | Freq: Once | INTRAMUSCULAR | Status: AC
  Administered 2016-08-07: 4 mg via INTRAVENOUS

## 2016-08-07 MED ORDER — LEVOFLOXACIN 750 MG PO TABS
750.0000 mg | ORAL_TABLET | Freq: Every day | ORAL | Status: DC
  Administered 2016-08-08 – 2016-08-09 (×2): 750 mg via ORAL
  Filled 2016-08-07 (×2): qty 1

## 2016-08-07 MED ORDER — PANTOPRAZOLE SODIUM 40 MG PO TBEC
40.0000 mg | DELAYED_RELEASE_TABLET | Freq: Every day | ORAL | Status: DC
  Administered 2016-08-07 – 2016-08-09 (×3): 40 mg via ORAL
  Filled 2016-08-07 (×3): qty 1

## 2016-08-07 MED ORDER — ONDANSETRON HCL 4 MG/2ML IJ SOLN
INTRAMUSCULAR | Status: AC
  Administered 2016-08-07: 4 mg via INTRAVENOUS
  Filled 2016-08-07: qty 2

## 2016-08-07 MED ORDER — ACETAMINOPHEN 650 MG RE SUPP: 650.0000 mg | Freq: Four times a day (QID) | RECTAL | Status: DC | PRN

## 2016-08-07 NOTE — ED Notes (Signed)
Hospitalist at bedside 

## 2016-08-07 NOTE — ED Notes (Signed)
Pt transport to 116

## 2016-08-07 NOTE — ED Triage Notes (Signed)
Patient arrives by EMS with c/o weakness that started this am.  Patient states he "caught himself" up against a table and denies any fall or any pain at this time.  Pt has been tx for pneumonia and is still taking abx and the patient was tx for ptx this last Wednesday.  Pt has hx of cancer and has a left chest port that is unaccessed.  EMS reports negative orthostatics on site.  Pt has hx of COPD and was taking himself a neb tx when they arrived.

## 2016-08-07 NOTE — ED Notes (Signed)
Patient transported to X-ray 

## 2016-08-07 NOTE — H&P (Signed)
[facility] NAME: [name]    MR#:  [id number]  DATE OF BIRTH:  Jul 28, 1940  DATE OF ADMISSION:  08/07/2016  PRIMARY CARE PHYSICIAN: Tower, Wynelle Fanny, MD   REQUESTING/REFERRING PHYSICIAN: Joni Fears, MD  CHIEF COMPLAINT:   Chief Complaint  Patient presents with  . Weakness    HISTORY OF PRESENT ILLNESS:  [name]  is a 76 y.o. male who presents with Generalized weakness and an episode of near-syncope. Patient was discharged within the last 48 hours after treatment here for pneumonia. He was sent home with antibiotics and prednisone. He states that he got up to go to the bathroom today and felt very lightheaded, and then became weak and fell. His daughter witnessed the event and was with him and caught him. His daughter who is at bedside today states that he did not actually lose consciousness. In the ED he was found to be somewhat hypomagnesemic. He is also dehydrated. He denies any symptoms of chest pain, diaphoresis, palpitations.  PAST MEDICAL HISTORY:   Past Medical History:  Diagnosis Date  . AAA (abdominal aortic aneurysm) (HCC)    4.2 cm IR AAA noted on 01/13/15 PET scan  . Anxiety   . CAD (coronary artery disease)    AMI 1997 w/ BMS x 2 LAD, BMS CFX 2002, DES x 2 LAD 2009, DES CFX & OM 2012   . Carotid artery disease ([place])    RICA CEA 9381, LICA CEA 0175 per Dr. [name],   . COPD (chronic obstructive pulmonary disease) ([place])   . GERD (gastroesophageal reflux disease)    occ  . HTN (hypertension)   . Hydradenitis   . Hyperlipidemia   . Ischemic cardiomyopathy    EF 40-45 percent at cath 2012  . Myocardial infarction ([place])   . Non-small cell lung cancer (NSCLC) ([place]) dx'd 12/2014   SBRT  . Osteoarthritis    pt. denies 01/17/2015  . PAC (premature atrial contraction)   . Peripheral vascular disease ([place])   . Pneumonia   . Prostate cancer ([place]) dx'd approx 2008   xrt throughfoley  . Radiation  02/18/15-02/25/15   right upper lung 54 Gy  . Shortness of breath dyspnea   . Stroke [facility]) 11-24-11   "no feeling in right 5th digit"  . Urinary frequency   . Urinary urgency     PAST SURGICAL HISTORY:   Past Surgical History:  Procedure Laterality Date  . CARDIAC CATHETERIZATION    . CARDIOVASCULAR STRESS TEST  12-2006  . CARDIOVERSION N/A 10/31/2015   Procedure: CARDIOVERSION;  Surgeon: Sueanne Margarita, MD;  Location: [place];  Service: Cardiovascular;  Laterality: N/A;  . CAROTID ENDARTERECTOMY Right 03-2000   CE  . CAROTID ENDARTERECTOMY Left 11-24-11   CE  . COLONOSCOPY  04-2001   polyp  . [place], 2002, 2009, 2012   8 stents total  . [place]   neg  . ENDARTERECTOMY  11/24/2011   Procedure: ENDARTERECTOMY CAROTID;  Surgeon: Rosetta Posner, MD;  Location: [place];  Service: Vascular;  Laterality: Left;  . EYE SURGERY Bilateral Aug. 17, 2015   Cataract  . FUDUCIAL PLACEMENT N/A 12/25/2014   Procedure: PLACEMENT OF FUDUCIAL;  Surgeon: Collene Gobble, MD;  Location: [place];  Service: Thoracic;  Laterality: N/A;  . IR GENERIC HISTORICAL  03/18/2016   IR US GUIDE VASC ACCESS LEFT 03/18/2016 Jacqulynn Cadet, MD [facility]-INTERV RAD  .  IR GENERIC HISTORICAL  03/18/2016   IR FLUORO GUIDE PORT INSERTION LEFT 03/18/2016 Jacqulynn Cadet, MD [facility]  . PROSTATE SURGERY     Radiation Tx  X's 40  . TEE WITHOUT CARDIOVERSION N/A 10/31/2015   Procedure: TRANSESOPHAGEAL ECHOCARDIOGRAM (TEE);  Surgeon: Sueanne Margarita, MD;  Location: [facility] ENDOSCOPY;  Service: Cardiovascular;  Laterality: N/A;  . VASCULAR SURGERY    . VIDEO BRONCHOSCOPY WITH ENDOBRONCHIAL NAVIGATION N/A 12/25/2014   Procedure: VIDEO BRONCHOSCOPY WITH ENDOBRONCHIAL NAVIGATION  with Biopsies and Brushings;  Surgeon: Collene Gobble, MD;  Location: [place];  Service: Thoracic;  Laterality: N/A;  . VIDEO BRONCHOSCOPY WITH ENDOBRONCHIAL ULTRASOUND N/A 01/22/2015   Procedure: VIDEO BRONCHOSCOPY WITH  ENDOBRONCHIAL ULTRASOUND with Biopsies;  Surgeon: Collene Gobble, MD;  Location: [place];  Service: Thoracic;  Laterality: N/A;    SOCIAL HISTORY:   Social History  Substance Use Topics  . Smoking status: Current Every Day Smoker    Packs/day: 1.00    Years: 55.00    Types: Cigarettes  . Smokeless tobacco: Never Used     Comment: 1/2 ppd (01/07/15)  . Alcohol use 0.0 oz/week     Comment: occ    FAMILY HISTORY:   Family History  Problem Relation Age of Onset  . Stroke Father   . Heart disease Father   . Heart disease Mother        pacemaker  . Other Brother        benign brain tumor  . Cancer Brother        Eye-behind  . Cancer Sister        Breast  . Liver cancer Brother   . Diabetes Maternal Grandmother   . Colon polyps Neg Hx   . Esophageal cancer Neg Hx     DRUG ALLERGIES:   Allergies  Allergen Reactions  . Augmentin [Amoxicillin-Pot Clavulanate] Diarrhea, Nausea And Vomiting and Other (See Comments)    Has patient had a PCN reaction causing immediate rash, facial/tongue/throat swelling, SOB or lightheadedness with hypotension: No Has patient had a PCN reaction causing severe rash involving mucus membranes or skin necrosis: No Has patient had a PCN reaction that required hospitalization No Has patient had a PCN reaction occurring within the last 10 years: Yes If all of the above answers are "NO", then may proceed with Cephalosporin use.   . Chantix [Varenicline] Other (See Comments)    Violent nightmares  . Sulfonamide Derivatives Other (See Comments)    Pt do not remember 8-30 md  . Zantac [Ranitidine Hcl] Diarrhea    MEDICATIONS AT HOME:   Prior to Admission medications   Medication Sig Start Date End Date Taking? Authorizing Provider  acetaminophen (TYLENOL) 500 MG tablet Take 1,000 mg by mouth every 4 (four) hours as needed for mild pain, moderate pain, fever or headache.  10/17/15  Yes [provider]  ALPRAZolam (XANAX) 0.5 MG tablet Take  0.5-1 tablets (0.25-0.5 mg total) by mouth daily as needed for anxiety or sleep. 05/18/16  Yes Owens Shark, NP  clopidogrel (PLAVIX) 75 MG tablet TAKE 1 TABLET BY MOUTH EVERY DAY 06/07/16  Yes Burnell Blanks, MD  diphenoxylate-atropine (LOMOTIL) 2.5-0.025 MG tablet Take 1-2 tablets by mouth 4 (four) times daily as needed for diarrhea or loose stools. 06/15/16  Yes Owens Shark, NP  fludrocortisone (FLORINEF) 0.1 MG tablet Take 1 tablet (0.1 mg total) by mouth daily. 08/05/16  Yes Tower, Wynelle Fanny, MD  fluticasone-salmeterol (ADVAIR HFA) 016-01 MCG/ACT inhaler  Inhale 2 puffs into the lungs 2 (two) times daily.   Yes [provider]  ipratropium-albuterol (DUONEB) 0.5-2.5 (3) MG/3ML SOLN Take 3 mLs by nebulization every 4 (four) hours as needed (shortness of breath and wheezing).   Yes [provider]  levofloxacin (LEVAQUIN) 750 MG tablet Take 1 tablet (750 mg total) by mouth daily. 08/05/16  Yes Mody, Ulice Bold, MD  lidocaine (XYLOCAINE) 2 % solution Use as directed 20 mLs in the mouth or throat as needed for mouth pain.   Yes [provider]  lidocaine-prilocaine (EMLA) cream Apply to port site one hour prior to use. Do not rub in. Cover with plastic. 07/07/16  Yes Ladell Pier, MD  loratadine (CLARITIN) 10 MG tablet TAKE 1 TABLET BY MOUTH EVERY DAY 07/22/16  Yes Collene Gobble, MD  mirtazapine (REMERON) 15 MG tablet TAKE 1 TABLET (15 MG TOTAL) BY MOUTH AT BEDTIME. 07/05/16  Yes Tower, Wynelle Fanny, MD  Multiple Vitamin (MULTIVITAMIN WITH MINERALS) TABS tablet Take 1 tablet by mouth daily.   Yes [provider]  nitroGLYCERIN (NITROSTAT) 0.4 MG SL tablet Place 0.4 mg under the tongue every 5 (five) minutes as needed for chest pain.   Yes [provider]  omeprazole (PRILOSEC) 40 MG capsule TAKE 1 CAPSULE 30 MINUTES BEFORE BREAKFAST AND DINNER 08/04/16  Yes Ladene Artist, MD  potassium chloride 20 MEQ/15ML (10%) SOLN Take 15 mLs (20 mEq total) by mouth  daily. 08/06/16  Yes Owens Shark, NP  predniSONE (DELTASONE) 50 MG tablet Take 1 tablet (50 mg total) by mouth daily with breakfast. 08/05/16 08/09/16 Yes Mody, Ulice Bold, MD  prochlorperazine (COMPAZINE) 5 MG tablet Take 1 tablet (5 mg total) by mouth every 6 (six) hours as needed for nausea or vomiting. 07/27/16  Yes Ladell Pier, MD  tiotropium (SPIRIVA) 18 MCG inhalation capsule Place 1 capsule (18 mcg total) into inhaler and inhale daily. 02/24/16  Yes Parrett, Tammy S, NP  traMADol (ULTRAM) 50 MG tablet Take 1 tablet (50 mg total) by mouth every 6 (six) hours as needed. 07/29/16  Yes Ladell Pier, MD  VENTOLIN HFA 108 (90 Base) MCG/ACT inhaler INHALE 2 PUFFS INTO THE LUNGS EVERY 4 (FOUR) HOURS AS NEEDED FOR WHEEZING OR SHORTNESS OF BREATH. 05/12/16  Yes Tower, Wynelle Fanny, MD  predniSONE (DELTASONE) 20 MG tablet Take 0.5 tablets (10 mg total) by mouth every other day. Please continue after you take 50 mg prednisone for 5 days To stimulate appetite Patient not taking: Reported on 08/07/2016 08/05/16   Bettey Costa, MD    REVIEW OF SYSTEMS:  Review of Systems  Constitutional: Negative for chills, fever, malaise/fatigue and weight loss.  HENT: Negative for ear pain, hearing loss and tinnitus.   Eyes: Negative for blurred vision, double vision, pain and redness.  Respiratory: Negative for cough, hemoptysis and shortness of breath.   Cardiovascular: Negative for chest pain, palpitations, orthopnea and leg swelling.  Gastrointestinal: Negative for abdominal pain, constipation, diarrhea, nausea and vomiting.  Genitourinary: Negative for dysuria, frequency and hematuria.  Musculoskeletal: Negative for back pain, joint pain and neck pain.  Skin:       No acne, rash, or lesions  Neurological: Negative for dizziness, tremors, focal weakness and weakness.       Lightheadedness  Endo/Heme/Allergies: Negative for polydipsia. Does not bruise/bleed easily.  Psychiatric/Behavioral: Negative for depression.  The patient is not nervous/anxious and does not have insomnia.      VITAL SIGNS:   Vitals:  08/07/16 1730 08/07/16 1800 08/07/16 1830 08/07/16 2000  BP: 103/60 100/64 111/66 114/68  Pulse: 94 91 94 84  Resp: '15 18 18 10  '$ Temp:      TempSrc:      SpO2: 99% 98% 99% 99%   Wt Readings from Last 3 Encounters:  08/04/16 55.6 kg (122 lb 9.6 oz)  07/30/16 57.3 kg (126 lb 6.4 oz)  07/27/16 59.2 kg (130 lb 8 oz)    PHYSICAL EXAMINATION:  Physical Exam  Vitals reviewed. Constitutional: He is oriented to person, place, and time. He appears well-developed and well-nourished. No distress.  HENT:  Head: Normocephalic and atraumatic.  Mouth/Throat: Oropharynx is clear and moist.  Eyes: Conjunctivae and EOM are normal. Pupils are equal, round, and reactive to light. No scleral icterus.  Neck: Normal range of motion. Neck supple. No JVD present. No thyromegaly present.  Cardiovascular: Normal rate, regular rhythm and intact distal pulses.  Exam reveals no gallop and no friction rub.   No murmur heard. Respiratory: Effort normal. No respiratory distress. He has no wheezes. He has no rales.  Mild right basilar coarse breath sounds  GI: Soft. Bowel sounds are normal. He exhibits no distension. There is no tenderness.  Musculoskeletal: Normal range of motion. He exhibits no edema.  No arthritis, no gout  Lymphadenopathy:    He has no cervical adenopathy.  Neurological: He is alert and oriented to person, place, and time. No cranial nerve deficit.  No dysarthria, no aphasia  Skin: Skin is warm and dry. No rash noted. No erythema.  Psychiatric: He has a normal mood and affect. His behavior is normal. Judgment and thought content normal.    LABORATORY PANEL:   CBC  Recent Labs Lab 08/07/16 1652  WBC 6.8  HGB 8.6*  HCT 26.3*  PLT 481*   ------------------------------------------------------------------------------------------------------------------  Chemistries   Recent Labs Lab  08/07/16 1652  NA 135  K 3.4*  CL 97*  CO2 26  GLUCOSE 109*  BUN 15  CREATININE 1.01  CALCIUM 8.5*  MG 1.5*  AST 23  ALT 10*  ALKPHOS 79  BILITOT 0.4   ------------------------------------------------------------------------------------------------------------------  Cardiac Enzymes  Recent Labs Lab 08/07/16 1652  TROPONINI <0.03   ------------------------------------------------------------------------------------------------------------------  RADIOLOGY:  Dg Chest 2 View  Result Date: 08/07/2016 CLINICAL DATA:  Weakness, recent pneumonia and pneumothorax, history of lung cancer EXAM: CHEST  2 VIEW COMPARISON:  08/05/2016 FINDINGS: Stable small left apical pneumothorax. Volume loss in the right hemithorax. Fiducial markers in the right upper lung with adjacent scarring. Small right pleural effusion. Right basilar opacity, atelectasis versus pneumonia. The heart is normal in size. Left chest port terminates the cavoatrial junction. IMPRESSION: Stable small left apical pneumothorax. Right basilar opacity, atelectasis versus pneumonia. Small right pleural effusion. Electronically Signed   By: Julian Hy M.D.   On: 08/07/2016 17:42    EKG:   Orders placed or performed during the hospital encounter of 08/07/16  . ED EKG  . ED EKG    IMPRESSION AND PLAN:  Principal Problem:   Weakness - Suspect this is due to the patient's dehydration with possibly some symptoms of orthostasis, in conjunction with his pneumonia though his symptoms are much improved, potentially related as well to high-dose prednisone he was on. We will hydrate him, monitor and maintain his electrolytes have optimal level, and stop his prednisone. Active Problems:   Pneumonia - continue Levaquin as his symptoms are much improved, and his pneumonia has improved even radiographically.   HYPERTENSION, BENIGN -  continue home meds   Chronic obstructive pulmonary disease ([place]) - home dose inhalers and  medications   Atrial flutter ([place]) - currently in sinus rhythm, continue home meds   Chronic systolic heart failure ([place]) - EF in August 2017 was 35-40%, repeat echo in November 2017 with improved EF to 60-65%, continue home meds and cautious IV fluid administration  All the records are reviewed and case discussed with ED provider. Management plans discussed with the patient and/or family.  DVT PROPHYLAXIS: SubQ lovenox  GI PROPHYLAXIS: PPI  ADMISSION STATUS: Observation  CODE STATUS: Full Code Status History    Date Active Date Inactive Code Status Order ID Comments User Context   08/04/2016  6:50 PM 08/05/2016  6:55 PM Full Code [id number]  Henreitta Leber, MD Inpatient   01/23/2016  5:30 PM 01/25/2016  4:48 PM Full Code [id number]  Epifanio Lesches, MD ED   11/20/2015 12:33 AM 11/20/2015  7:59 PM Full Code [id number]  Hugelmeyer, [place], DO Inpatient   10/30/2015  2:53 PM 11/01/2015  5:14 PM Full Code [id number]  Thurnell Lose, MD Inpatient   11/24/2011  3:30 PM 11/27/2011  1:13 PM Full Code [id number]  Lucienne Capers, RN Inpatient    Advance Directive Documentation     Most Recent Value  Type of Advance Directive  Healthcare Power of Attorney  Pre-existing out of facility DNR order (yellow form or pink MOST form)  -  "MOST" Form in Place?  -      TOTAL TIME TAKING CARE OF THIS PATIENT: 40 minutes.   Kayann Maj Hardin 08/07/2016, 9:11 PM  [employer] [facility]  Office  [phone]  CC: Primary care physician; Tower, Wynelle Fanny, MD  Note:  This document was prepared using Systems analyst and may include unintentional dictation errors.

## 2016-08-07 NOTE — ED Provider Notes (Signed)
[facility] Emergency Department Provider Note  ____________________________________________  Time seen: Approximately 5:37 PM  I have reviewed the triage vital signs and the nursing notes.   HISTORY  Chief Complaint Weakness    HPI [name] is a 76 y.o. male who complains of generalized weakness that started when he woke up this morning getting out of bed. He feels like he was relatively in his normal state of health last night at bedtime. He had dizziness with standing and walking, and began to fall while in his living room. He was able to brace himself against the table and prevent falling down. He denies any injuries. He denies any pain at this time whatsoever. No pain earlier today during this course of symptoms. He reports that he has not been eating and drinking enough due to decreased appetite in the setting of chemotherapy for cancer. He was recently in the hospital just within the last few days where he was treated for healthcare associated pneumonia. He is continuing on Levaquin and prednisone.  Generalized Weakness has been constant since this morning. Worse with standing and ambulation, better with rest.He graded as moderate in severity     Past Medical History:  Diagnosis Date  . AAA (abdominal aortic aneurysm) (HCC)    4.2 cm IR AAA noted on 01/13/15 PET scan  . Anxiety   . CAD (coronary artery disease)    AMI 1997 w/ BMS x 2 LAD, BMS CFX 2002, DES x 2 LAD 2009, DES CFX & OM 2012   . Carotid artery disease ([place])    RICA CEA 1610, LICA CEA 9604 per Dr. [name],   . COPD (chronic obstructive pulmonary disease) ([place])   . GERD (gastroesophageal reflux disease)    occ  . HTN (hypertension)   . Hydradenitis   . Hyperlipidemia   . Ischemic cardiomyopathy    EF 40-45 percent at cath 2012  . Myocardial infarction ([place])   . Non-small cell lung cancer (NSCLC) ([place]) dx'd 12/2014   SBRT  . Osteoarthritis    pt. denies 01/17/2015  . PAC  (premature atrial contraction)   . Peripheral vascular disease ([place])   . Pneumonia   . Prostate cancer ([place]) dx'd approx 2008   xrt throughfoley  . Radiation 02/18/15-02/25/15   right upper lung 54 Gy  . Shortness of breath dyspnea   . Stroke [facility]) 11-24-11   "no feeling in right 5th digit"  . Urinary frequency   . Urinary urgency      Patient Active Problem List   Diagnosis Date Noted  . COPD exacerbation ([place]) 08/04/2016  . Port catheter in place 04/06/2016  . 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 Dysphagia 02/04/2016  . Left arm weakness 12/19/2015  . Numbness and tingling in left arm 12/19/2015  . Left shoulder pain 12/19/2015  . History of recent fall 12/19/2015  . Paresthesia of arm 12/01/2015  . Depression 12/01/2015  . Malnutrition of moderate degree 11/20/2015  . TIA (transient ischemic attack) 11/19/2015  . Weakness 11/17/2015  . Physical deconditioning 11/10/2015  . Multiple rib fractures 11/10/2015  . Chronic systolic heart failure ([place]) 11/02/2015  . Atrial flutter with rapid ventricular response ([place]) 10/30/2015  . Atrial flutter ([place]) 10/30/2015  . B12 deficiency 07/22/2015  . Anemia 07/15/2015  . Hematuria 07/08/2015  . Unintentional weight loss 07/08/2015  . Hypotension 07/08/2015  . 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 Cough 05/22/2015  . Tobacco user 05/22/2015  .  Mediastinal lymphadenopathy 01/22/2015  . 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 S/P bronchoscopy with biopsy   . Colon cancer screening 11/06/2014  . Encounter for Medicare annual wellness exam 11/06/2014  . Routine general medical examination at a health care facility 11/06/2014  . Lung nodule 06/28/2014  . Carotid stenosis 12/20/2013  . Goals of care, counseling/discussion 12/20/2013  . Aftercare following surgery of the circulatory system, [place] 12/19/2012  . Personal history of colonic polyps 08/28/2012  . Hyperglycemia 08/07/2012  . Pre-operative  cardiovascular examination 11/03/2011  . Carotid artery disease ([place]) 05/20/2011  . Grief reaction 05/07/2011  . Ischemic cardiomyopathy 01/13/2011  . Palpitations 11/10/2010  . Occlusion and stenosis of carotid artery without mention of cerebral infarction 04/02/2010  . Anxiety disorder 12/12/2009  . HYPERTENSION, BENIGN 09/29/2008  . History of prostate cancer 09/19/2008  . Hyperlipidemia 02/10/2007  . AMAUROSIS FUGAX 02/10/2007  . MYOCARDIAL INFARCTION, HX OF 02/10/2007  . Coronary atherosclerosis 02/10/2007  . PERIPHERAL VASCULAR DISEASE 02/10/2007  . HEMORRHOIDS 02/10/2007  . Chronic obstructive pulmonary disease ([place]) 02/10/2007  . OSTEOARTHRITIS 02/10/2007  . Nicotine dependence 02/10/2007  . Pure hypercholesterolemia 01/27/2007  . CORONARY ATHEROSCLEROSIS NATIVE CORONARY ARTERY 01/27/2007     Past Surgical History:  Procedure Laterality Date  . CARDIAC CATHETERIZATION    . CARDIOVASCULAR STRESS TEST  12-2006  . CARDIOVERSION N/A 10/31/2015   Procedure: CARDIOVERSION;  Surgeon: Sueanne Margarita, MD;  Location: [place];  Service: Cardiovascular;  Laterality: N/A;  . CAROTID ENDARTERECTOMY Right 03-2000   CE  . CAROTID ENDARTERECTOMY Left 11-24-11   CE  . COLONOSCOPY  04-2001   polyp  . [place], 2002, 2009, 2012   8 stents total  . [place]   neg  . ENDARTERECTOMY  11/24/2011   Procedure: ENDARTERECTOMY CAROTID;  Surgeon: Rosetta Posner, MD;  Location: [place];  Service: Vascular;  Laterality: Left;  . EYE SURGERY Bilateral Aug. 17, 2015   Cataract  . FUDUCIAL PLACEMENT N/A 12/25/2014   Procedure: PLACEMENT OF FUDUCIAL;  Surgeon: Collene Gobble, MD;  Location: [place];  Service: Thoracic;  Laterality: N/A;  . IR GENERIC HISTORICAL  03/18/2016   IR US GUIDE VASC ACCESS LEFT 03/18/2016 Jacqulynn Cadet, MD [facility]-INTERV RAD  . IR GENERIC HISTORICAL  03/18/2016   IR FLUORO GUIDE PORT INSERTION LEFT 03/18/2016 Jacqulynn Cadet, MD  [facility]-INTERV RAD  . PROSTATE SURGERY     Radiation Tx  X's 40  . TEE WITHOUT CARDIOVERSION N/A 10/31/2015   Procedure: TRANSESOPHAGEAL ECHOCARDIOGRAM (TEE);  Surgeon: Sueanne Margarita, MD;  Location: [facility] ENDOSCOPY;  Service: Cardiovascular;  Laterality: N/A;  . VASCULAR SURGERY    . VIDEO BRONCHOSCOPY WITH ENDOBRONCHIAL NAVIGATION N/A 12/25/2014   Procedure: VIDEO BRONCHOSCOPY WITH ENDOBRONCHIAL NAVIGATION  with Biopsies and Brushings;  Surgeon: Collene Gobble, MD;  Location: [place];  Service: Thoracic;  Laterality: N/A;  . VIDEO BRONCHOSCOPY WITH ENDOBRONCHIAL ULTRASOUND N/A 01/22/2015   Procedure: VIDEO BRONCHOSCOPY WITH ENDOBRONCHIAL ULTRASOUND with Biopsies;  Surgeon: Collene Gobble, MD;  Location: [place];  Service: Thoracic;  Laterality: N/A;     Prior to Admission medications   Medication Sig Start Date End Date Taking? Authorizing Provider  acetaminophen (TYLENOL) 500 MG tablet Take 1,000 mg by mouth every 4 (four) hours as needed for mild pain, moderate pain, fever or headache.  10/17/15  Yes [provider]  ALPRAZolam (XANAX) 0.5 MG tablet Take 0.5-1 tablets (0.25-0.5 mg total) by mouth  daily as needed for anxiety or sleep. 05/18/16  Yes Owens Shark, NP  clopidogrel (PLAVIX) 75 MG tablet TAKE 1 TABLET BY MOUTH EVERY DAY 06/07/16  Yes Burnell Blanks, MD  diphenoxylate-atropine (LOMOTIL) 2.5-0.025 MG tablet Take 1-2 tablets by mouth 4 (four) times daily as needed for diarrhea or loose stools. 06/15/16  Yes Owens Shark, NP  fludrocortisone (FLORINEF) 0.1 MG tablet Take 1 tablet (0.1 mg total) by mouth daily. 08/05/16  Yes Tower, Wynelle Fanny, MD  fluticasone-salmeterol (ADVAIR HFA) 409-81 MCG/ACT inhaler Inhale 2 puffs into the lungs 2 (two) times daily.   Yes [provider]  ipratropium-albuterol (DUONEB) 0.5-2.5 (3) MG/3ML SOLN Take 3 mLs by nebulization every 4 (four) hours as needed (shortness of breath and wheezing).   Yes [provider]  levofloxacin  (LEVAQUIN) 750 MG tablet Take 1 tablet (750 mg total) by mouth daily. 08/05/16  Yes Mody, Ulice Bold, MD  lidocaine (XYLOCAINE) 2 % solution Use as directed 20 mLs in the mouth or throat as needed for mouth pain.   Yes [provider]  lidocaine-prilocaine (EMLA) cream Apply to port site one hour prior to use. Do not rub in. Cover with plastic. 07/07/16  Yes Ladell Pier, MD  loratadine (CLARITIN) 10 MG tablet TAKE 1 TABLET BY MOUTH EVERY DAY 07/22/16  Yes Collene Gobble, MD  mirtazapine (REMERON) 15 MG tablet TAKE 1 TABLET (15 MG TOTAL) BY MOUTH AT BEDTIME. 07/05/16  Yes Tower, Wynelle Fanny, MD  Multiple Vitamin (MULTIVITAMIN WITH MINERALS) TABS tablet Take 1 tablet by mouth daily.   Yes [provider]  nitroGLYCERIN (NITROSTAT) 0.4 MG SL tablet Place 0.4 mg under the tongue every 5 (five) minutes as needed for chest pain.   Yes [provider]  omeprazole (PRILOSEC) 40 MG capsule TAKE 1 CAPSULE 30 MINUTES BEFORE BREAKFAST AND DINNER 08/04/16  Yes Ladene Artist, MD  potassium chloride 20 MEQ/15ML (10%) SOLN Take 15 mLs (20 mEq total) by mouth daily. 08/06/16  Yes Owens Shark, NP  predniSONE (DELTASONE) 50 MG tablet Take 1 tablet (50 mg total) by mouth daily with breakfast. 08/05/16 08/09/16 Yes Mody, Ulice Bold, MD  prochlorperazine (COMPAZINE) 5 MG tablet Take 1 tablet (5 mg total) by mouth every 6 (six) hours as needed for nausea or vomiting. 07/27/16  Yes Ladell Pier, MD  tiotropium (SPIRIVA) 18 MCG inhalation capsule Place 1 capsule (18 mcg total) into inhaler and inhale daily. 02/24/16  Yes Parrett, Tammy S, NP  traMADol (ULTRAM) 50 MG tablet Take 1 tablet (50 mg total) by mouth every 6 (six) hours as needed. 07/29/16  Yes Ladell Pier, MD  VENTOLIN HFA 108 (90 Base) MCG/ACT inhaler INHALE 2 PUFFS INTO THE LUNGS EVERY 4 (FOUR) HOURS AS NEEDED FOR WHEEZING OR SHORTNESS OF BREATH. 05/12/16  Yes Tower, Wynelle Fanny, MD  predniSONE (DELTASONE) 20 MG tablet Take 0.5 tablets (10 mg total)  by mouth every other day. Please continue after you take 50 mg prednisone for 5 days To stimulate appetite Patient not taking: Reported on 08/07/2016 08/05/16   Bettey Costa, MD     Allergies Augmentin [amoxicillin-pot clavulanate]; Chantix [varenicline]; Sulfonamide derivatives; and Zantac [ranitidine hcl]   Family History  Problem Relation Age of Onset  . Stroke Father   . Heart disease Father   . Heart disease Mother        pacemaker  . Other Brother        benign brain tumor  . Cancer Brother  Eye-behind  . Cancer Sister        Breast  . Liver cancer Brother   . Diabetes Maternal Grandmother   . Colon polyps Neg Hx   . Esophageal cancer Neg Hx     Social History Social History  Substance Use Topics  . Smoking status: Current Every Day Smoker    Packs/day: 1.00    Years: 55.00    Types: Cigarettes  . Smokeless tobacco: Never Used     Comment: 1/2 ppd (01/07/15)  . Alcohol use 0.0 oz/week     Comment: occ    Review of Systems  Constitutional:   No fever or chills.  ENT:   No sore throat. No rhinorrhea. Cardiovascular:   No chest pain or syncope. Respiratory:   No dyspnea , Occasional cough. Gastrointestinal:   Negative for abdominal pain, vomiting and diarrhea.  Musculoskeletal:   Negative for focal pain or swelling All other systems reviewed and are negative except as documented above in ROS and HPI.  ____________________________________________   PHYSICAL EXAM:  VITAL SIGNS: ED Triage Vitals  Enc Vitals Group     BP 08/07/16 1650 91/66     Pulse --      Resp 08/07/16 1650 20     Temp 08/07/16 1650 98.1 F (36.7 C)     Temp Source 08/07/16 1650 Oral     SpO2 08/07/16 1648 (S) 100 %     Weight --      Height --      Head Circumference --      Peak Flow --      Pain Score --      Pain Loc --      Pain Edu? --      Excl. in [place]? --     Vital signs reviewed, nursing assessments reviewed.   Constitutional:   Alert and oriented.  Chronically ill-appearing, not in acute distress Eyes:   No scleral icterus. No conjunctival pallor. PERRL. EOMI.  No nystagmus. ENT   Head:   Normocephalic and atraumatic.   Nose:   No congestion/rhinnorhea.    Mouth/Throat:   MMM, no pharyngeal erythema. No peritonsillar mass.    Neck:   No meningismus. Full ROM. Trachea midline Hematological/Lymphatic/Immunilogical:   No cervical lymphadenopathy. Cardiovascular:   Tachycardia heart rate 100. Symmetric bilateral radial and DP pulses.  No murmurs.  Respiratory:   Tachypnea. No retractions or accessory muscle use. Coarse breath sounds diffusely without focal crackles. Breath sounds are symmetric in all lung fields.. Port-A-Cath present in left upper chest. Gastrointestinal:   Soft and nontender. Non distended. There is no CVA tenderness.  No rebound, rigidity, or guarding. Genitourinary:   deferred Musculoskeletal:   Normal range of motion in all extremities. No joint effusions.  No lower extremity tenderness.  No edema. Neurologic:   Normal speech and language.  CN 2-10 normal. Motor grossly intact. No gross focal neurologic deficits are appreciated.  Skin:    Skin is warm, dry and intact. No rash noted.  No petechiae, purpura, or bullae.  ____________________________________________    LABS (pertinent positives/negatives) (all labs ordered are listed, but only abnormal results are displayed) Labs Reviewed  COMPREHENSIVE METABOLIC PANEL - Abnormal; Notable for the following:       Result Value   Potassium 3.4 (*)    Chloride 97 (*)    Glucose, Bld 109 (*)    Calcium 8.5 (*)    Total Protein 6.3 (*)    Albumin 2.9 (*)  ALT 10 (*)    All other components within normal limits  CBC WITH DIFFERENTIAL/PLATELET - Abnormal; Notable for the following:    RBC 3.05 (*)    Hemoglobin 8.6 (*)    HCT 26.3 (*)    RDW 22.8 (*)    Platelets 481 (*)    Lymphs Abs 0.4 (*)    Monocytes Absolute 0.1 (*)    All other components  within normal limits  MAGNESIUM - Abnormal; Notable for the following:    Magnesium 1.5 (*)    All other components within normal limits  LACTIC ACID, PLASMA - Abnormal; Notable for the following:    Lactic Acid, Venous 2.9 (*)    All other components within normal limits  URINALYSIS, COMPLETE (UACMP) WITH MICROSCOPIC - Abnormal; Notable for the following:    Color, Urine YELLOW (*)    APPearance HAZY (*)    All other components within normal limits  CK - Abnormal; Notable for the following:    Total CK 13 (*)    All other components within normal limits  URINE CULTURE  LIPASE, BLOOD  TROPONIN I  PHOSPHORUS  LACTIC ACID, PLASMA   ____________________________________________   EKG  Interpreted by me Sinus tachycardia rate 101, left axis, normal intervals. Normal QRS ST segments and T waves.  ____________________________________________    RADIOLOGY  Dg Chest 2 View  Result Date: 08/07/2016 CLINICAL DATA:  Weakness, recent pneumonia and pneumothorax, history of lung cancer EXAM: CHEST  2 VIEW COMPARISON:  08/05/2016 FINDINGS: Stable small left apical pneumothorax. Volume loss in the right hemithorax. Fiducial markers in the right upper lung with adjacent scarring. Small right pleural effusion. Right basilar opacity, atelectasis versus pneumonia. The heart is normal in size. Left chest port terminates the cavoatrial junction. IMPRESSION: Stable small left apical pneumothorax. Right basilar opacity, atelectasis versus pneumonia. Small right pleural effusion. Electronically Signed   By: Julian Hy M.D.   On: 08/07/2016 17:42    ____________________________________________   PROCEDURES Procedures  ____________________________________________   INITIAL IMPRESSION / ASSESSMENT AND PLAN / ED COURSE  Pertinent labs & imaging results that were available during my care of the patient were reviewed by me and considered in my medical decision making (see chart for  details).   Clinical Course as of Aug 07 2033  Sat Aug 07, 2016  1656 Pt presents with gen. Weakness, near syncope at home. He has metastatic cancer, on chemo. Will need workup for possible infection vs electrolyte derangement. IV fluids for hydration .   [PS]    Clinical Course User Index [PS] Carrie Mew, MD    ----------------------------------------- 8:35 PM on 08/07/2016 -----------------------------------------  Lactate 2.9, patient given additional fluid bolus for this. No evidence of any acute infectious source. Patient is continue Levaquin for the pneumonia which does not appear to be worse. I don't think he is septic. I think this is related to dehydration and electrolyte derangement. No abdominal pain to suggest mesenteric ischemia area no evidence of ischemic limb or other tissue ischemia. Patient still orthostatic and very weak. Case discussed with hospitalist for further management overnight.   ____________________________________________   FINAL CLINICAL IMPRESSION(S) / ED DIAGNOSES  Final diagnoses:  Dehydration  Generalized weakness  Hypomagnesemia      New Prescriptions   No medications on file     Portions of this note were generated with dragon dictation software. Dictation errors may occur despite best attempts at proofreading.    Carrie Mew, MD 08/07/16 2036

## 2016-08-07 NOTE — ED Notes (Signed)
Date and time results received: 08/07/16 1825  Test: Lactic Acid Critical Value: 2.9  Name of Provider Notified: Joni Fears, MD  Orders Received? Or Actions Taken?: None

## 2016-08-08 DIAGNOSIS — I4892 Unspecified atrial flutter: Secondary | ICD-10-CM | POA: Diagnosis not present

## 2016-08-08 DIAGNOSIS — I10 Essential (primary) hypertension: Secondary | ICD-10-CM | POA: Diagnosis not present

## 2016-08-08 DIAGNOSIS — R531 Weakness: Secondary | ICD-10-CM | POA: Diagnosis not present

## 2016-08-08 DIAGNOSIS — J189 Pneumonia, unspecified organism: Secondary | ICD-10-CM | POA: Diagnosis not present

## 2016-08-08 LAB — CBC
HEMATOCRIT: 23.3 % — AB (ref 40.0–52.0)
HEMOGLOBIN: 7.7 g/dL — AB (ref 13.0–18.0)
MCH: 28.1 pg (ref 26.0–34.0)
MCHC: 33 g/dL (ref 32.0–36.0)
MCV: 85.3 fL (ref 80.0–100.0)
Platelets: 412 10*3/uL (ref 150–440)
RBC: 2.73 MIL/uL — ABNORMAL LOW (ref 4.40–5.90)
RDW: 23.2 % — ABNORMAL HIGH (ref 11.5–14.5)
WBC: 5.5 10*3/uL (ref 3.8–10.6)

## 2016-08-08 LAB — BASIC METABOLIC PANEL
ANION GAP: 5 (ref 5–15)
BUN: 15 mg/dL (ref 6–20)
CALCIUM: 8.2 mg/dL — AB (ref 8.9–10.3)
CO2: 27 mmol/L (ref 22–32)
Chloride: 102 mmol/L (ref 101–111)
Creatinine, Ser: 0.82 mg/dL (ref 0.61–1.24)
GFR calc Af Amer: >60 mL/min (ref >60)
GFR calc non Af Amer: >60 mL/min (ref >60)
Glucose, Bld: 143 mg/dL — ABNORMAL HIGH (ref 65–99)
Potassium: 3.7 mmol/L (ref 3.5–5.1)
Sodium: 134 mmol/L — ABNORMAL LOW (ref 135–145)

## 2016-08-08 MED ORDER — HEPARIN SOD (PORK) LOCK FLUSH 100 UNIT/ML IV SOLN: 500.0000 [IU] | INTRAVENOUS | Status: DC | PRN

## 2016-08-08 MED ORDER — SODIUM CHLORIDE 0.9% FLUSH: 10.0000 mL | INTRAVENOUS | Status: DC | PRN

## 2016-08-08 MED ORDER — SODIUM CHLORIDE 0.9% FLUSH
3.0000 mL | Freq: Two times a day (BID) | INTRAVENOUS | Status: DC
Start: 2016-08-08 — End: 2016-08-09
  Administered 2016-08-08 – 2016-08-09 (×3): 3 mL via INTRAVENOUS

## 2016-08-08 MED ORDER — SODIUM CHLORIDE 0.9% FLUSH
10.0000 mL | Freq: Two times a day (BID) | INTRAVENOUS | Status: AC
  Administered 2016-08-08: 09:00:00 10 mL

## 2016-08-08 NOTE — Progress Notes (Signed)
Physical Therapy Evaluation Patient Details Name: NUMAIR MASDEN MRN: [id number] DOB: 28-Aug-1940 Today's Date: 08/08/2016   History of Present Illness  Stephone Gum  is a 76 y.o. male who presents with Generalized weakness and an episode of near-syncope. Patient was discharged within the last 48 hours after treatment here for pneumonia. He was sent home with antibiotics and prednisone. He states that he got up to go to the bathroom today and felt very lightheaded, and then became weak and fell. His daughter witnessed the event and was with him and caught him. His daughter who is at bedside today states that he did not actually lose consciousness. In the ED he was found to be somewhat hypomagnesemic. He is also dehydrated. He denies any symptoms of chest pain, diaphoresis, palpitations  Clinical Impression  Patient presents with decreased LE strength 3/5 and modified independent bed mobility and transfers sit to stand with RW. He has fair dynamic and static standing balance with difficulty with single leg stand and tandem stand. He ambulates 75 feet with RW and CGA and frequent standing and seated rest periods. He will benefit from skilled PT To improve strength and mobility.     Follow Up Recommendations Home health PT    Equipment Recommendations  Rolling walker with 5" wheels    Recommendations for Other Services       Precautions / Restrictions Precautions Precautions: Fall Restrictions Weight Bearing Restrictions: No      Mobility  Bed Mobility Overal bed mobility: Modified Independent                Transfers Overall transfer level: Modified independent Equipment used: Rolling walker (2 wheeled)                Ambulation/Gait Ambulation/Gait assistance: Modified independent (Device/Increase time) Ambulation Distance (Feet): 75 Feet Assistive device: Rolling walker (2 wheeled) Gait Pattern/deviations: Step-to pattern   Gait velocity interpretation: <1.8  ft/sec, indicative of risk for recurrent falls General Gait Details: slow gait speed, rest periods  Stairs            Wheelchair Mobility    Modified Rankin (Stroke Patients Only)       Balance Overall balance assessment: History of Falls;Modified Independent                                           Pertinent Vitals/Pain      Home Living Family/patient expects to be discharged to:: Private residence Living Arrangements: Alone Available Help at Discharge: Family;Available PRN/intermittently Type of Home: House Home Access: Stairs to enter Entrance Stairs-Rails: Can reach both Entrance Stairs-Number of Steps: 4 Home Layout: One level Home Equipment: Cane - single point Additional Comments: Daughter lives within walking distance of patient and provides frequent check in/assist as needed    Prior Function Level of Independence: Independent         Comments: Indep with ADLs, household and community mobility without assist device; + driving.  Does endorse single fall approx 1 month prior to admission with R-sided rib fractures.  Retired Agricultural consultant.     Hand Dominance   Dominant Hand: Right    Extremity/Trunk Assessment   Upper Extremity Assessment Upper Extremity Assessment: Overall WFL for tasks assessed    Lower Extremity Assessment Lower Extremity Assessment: Generalized weakness    Cervical / Trunk Assessment Cervical / Trunk Assessment: Normal  Communication   Communication:  No difficulties  Cognition Arousal/Alertness: Awake/alert Behavior During Therapy: WFL for tasks assessed/performed Overall Cognitive Status: Within Functional Limits for tasks assessed                                        General Comments      Exercises     Assessment/Plan    PT Assessment Patient needs continued PT services  PT Problem List Decreased strength;Decreased activity tolerance;Decreased balance;Decreased mobility        PT Treatment Interventions Gait training;Therapeutic activities;Therapeutic exercise;Balance training    PT Goals (Current goals can be found in the Care Plan section)  Acute Rehab PT Goals Patient Stated Goal: to go home PT Goal Formulation: With patient Time For Goal Achievement: 08/22/16 Potential to Achieve Goals: Good    Frequency Min 2X/week   Barriers to discharge        Co-evaluation               AM-PAC PT "6 Clicks" Daily Activity  Outcome Measure Difficulty turning over in bed (including adjusting bedclothes, sheets and blankets)?: None Difficulty moving from lying on back to sitting on the side of the bed? : A Little Difficulty sitting down on and standing up from a chair with arms (e.g., wheelchair, bedside commode, etc,.)?: A Little Help needed moving to and from a bed to chair (including a wheelchair)?: A Little Help needed walking in hospital room?: A Little Help needed climbing 3-5 steps with a railing? : A Little 6 Click Score: 19    End of Session Equipment Utilized During Treatment: Gait belt Activity Tolerance: Patient limited by fatigue Patient left: in chair;with call bell/phone within reach;with chair alarm set   PT Visit Diagnosis: Unsteadiness on feet (R26.81);Muscle weakness (generalized) (M62.81);History of falling (Z91.81);Difficulty in walking, not elsewhere classified (R26.2)    Time: 7473-4037 PT Time Calculation (min) (ACUTE ONLY): 28 min   Charges:   PT Evaluation $PT Eval Low Complexity: 1 Procedure PT Treatments $Gait Training: 8-22 mins   PT G Codes:   PT G-Codes **NOT FOR INPATIENT CLASS** Functional Assessment Tool Used: AM-PAC 6 Clicks Basic Mobility Functional Limitation: Mobility: Walking and moving around Mobility: Walking and Moving Around Current Status (Q9643): At least 40 percent but less than 60 percent impaired, limited or restricted Mobility: Walking and Moving Around Goal Status [phone]): At least 20 percent but  less than 40 percent impaired, limited or restricted    Alanson Puls, PT, DPT  [facility], [place] S 08/08/2016, 4:30 PM

## 2016-08-08 NOTE — Progress Notes (Signed)
[place] [facility] [place] NAME: [name]    MR#:  [id number]  DATE OF BIRTH:  09-20-1940  SUBJECTIVE: Patient discharged on 17th admitted yesterday because of near syncope. Blood pressure was low on admission, started on IV hydration. And blood pressure is better now. He  denies any complaints.   CHIEF COMPLAINT:   Chief Complaint  Patient presents with  . Weakness    REVIEW OF SYSTEMS:    Review of Systems  Constitutional: Negative for chills and fever.  HENT: Negative for hearing loss.   Eyes: Negative for blurred vision, double vision and photophobia.  Respiratory: Negative for cough, hemoptysis and shortness of breath.   Cardiovascular: Negative for palpitations, orthopnea and leg swelling.  Gastrointestinal: Negative for abdominal pain, diarrhea and vomiting.  Genitourinary: Negative for dysuria and urgency.  Musculoskeletal: Negative for myalgias and neck pain.  Skin: Negative for rash.  Neurological: Negative for dizziness, focal weakness, seizures, weakness and headaches.  Psychiatric/Behavioral: Negative for memory loss. The patient does not have insomnia.     Nutrition:  Tolerating Diet: Tolerating PT:      DRUG ALLERGIES:   Allergies  Allergen Reactions  . Augmentin [Amoxicillin-Pot Clavulanate] Diarrhea, Nausea And Vomiting and Other (See Comments)    Has patient had a PCN reaction causing immediate rash, facial/tongue/throat swelling, SOB or lightheadedness with hypotension: No Has patient had a PCN reaction causing severe rash involving mucus membranes or skin necrosis: No Has patient had a PCN reaction that required hospitalization No Has patient had a PCN reaction occurring within the last 10 years: Yes If all of the above answers are "NO", then may proceed with Cephalosporin use.   . Chantix [Varenicline] Other (See Comments)    Violent nightmares  . Sulfonamide Derivatives Other (See Comments)    Pt  do not remember 8-30 md  . Zantac [Ranitidine Hcl] Diarrhea    VITALS:  Blood pressure 125/61, pulse 76, temperature 97.5 F (36.4 C), temperature source Oral, resp. rate 20, height '5\' 8"'$  (1.727 m), weight 56.8 kg (125 lb 4 oz), SpO2 97 %.  PHYSICAL EXAMINATION:   Physical Exam  GENERAL:  76 y.o.-year-old patient lying in the bed with no acute distress.  EYES: Pupils equal, round, reactive to light and accommodation. No scleral icterus. Extraocular muscles intact.  HEENT: Head atraumatic, normocephalic. Oropharynx and nasopharynx clear.  NECK:  Supple, no jugular venous distention. No thyroid enlargement, no tenderness.  LUNGS: Normal breath sounds bilaterally, no wheezing, rales,rhonchi or crepitation. No use of accessory muscles of respiration.  CARDIOVASCULAR: S1, S2 normal. No murmurs, rubs, or gallops.  ABDOMEN: Soft, nontender, nondistended. Bowel sounds present. No organomegaly or mass.  EXTREMITIES: No pedal edema, cyanosis, or clubbing.  NEUROLOGIC: Cranial nerves II through XII are intact. Muscle strength 5/5 in all extremities. Sensation intact. Gait not checked.  PSYCHIATRIC: The patient is alert and oriented x 3.  SKIN: No obvious rash, lesion, or ulcer.    LABORATORY PANEL:   CBC  Recent Labs Lab 08/08/16 0514  WBC 5.5  HGB 7.7*  HCT 23.3*  PLT 412   ------------------------------------------------------------------------------------------------------------------  Chemistries   Recent Labs Lab 08/07/16 1652 08/08/16 0514  NA 135 134*  K 3.4* 3.7  CL 97* 102  CO2 26 27  GLUCOSE 109* 143*  BUN 15 15  CREATININE 1.01 0.82  CALCIUM 8.5* 8.2*  MG 1.5*  --   AST 23  --   ALT 10*  --  ALKPHOS 41  --   BILITOT 0.4  --    ------------------------------------------------------------------------------------------------------------------  Cardiac Enzymes  Recent Labs Lab 08/07/16 1652  TROPONINI <0.03    ------------------------------------------------------------------------------------------------------------------  RADIOLOGY:  Dg Chest 2 View  Result Date: 08/07/2016 CLINICAL DATA:  Weakness, recent pneumonia and pneumothorax, history of lung cancer EXAM: CHEST  2 VIEW COMPARISON:  08/05/2016 FINDINGS: Stable small left apical pneumothorax. Volume loss in the right hemithorax. Fiducial markers in the right upper lung with adjacent scarring. Small right pleural effusion. Right basilar opacity, atelectasis versus pneumonia. The heart is normal in size. Left chest port terminates the cavoatrial junction. IMPRESSION: Stable small left apical pneumothorax. Right basilar opacity, atelectasis versus pneumonia. Small right pleural effusion. Electronically Signed   By: Julian Hy M.D.   On: 08/07/2016 17:42     ASSESSMENT AND PLAN:   Principal Problem:   Weakness Active Problems:   HYPERTENSION, BENIGN   Chronic obstructive pulmonary disease (HCC)   Atrial flutter (HCC)   Chronic systolic heart failure (HCC)   Pneumonia  #1 near-syncope due to dehydration, hypokalemia: Improved with IV hydration, potassium supplements, Generalized  weakness secondary to multiple medical problems and also hypokalemia, recent high-dose prednisone.: Watch closely, get Physical  therapy today  #2. hypotension causing near syncope: Improved blood pressure with fluids. Get physical therapy consult.  #3. recent admission for healthcare associated pneumonia, COPD exacerbation: Patient antibiotics, patient has no wheezing continue the, Dulera.  #4./ history of coronary artery disease, PTCA before: Continue Plavix . #. #6 history of prostate cancer status post radiation therapy  #7 history of ischemic cardiomyopathy with EF 40-45%., History of chronic systolic heart failure. Ejection fraction improved by further echo done in November.  History of non-small cell cancer of the lung.      All the  records are reviewed and case discussed with Care Management/Social Workerr. Management plans discussed with the patient, family and they are in agreement.  CODE STATUS:full  TOTAL TIME TAKING CARE OF THIS PATIENT: 35 minutes.   POSSIBLE D/C IN 1-2DAYS, DEPENDING ON CLINICAL CONDITION.   Epifanio Lesches M.D on 08/08/2016 at 8:25 AM  Between 7am to 6pm - Pager - [phone]  After 6pm go to [email or web address] - password [email or web address] [facility]  [facility]  Office  [phone]  CC: Primary care physician; Tower, Wynelle Fanny, MD

## 2016-08-08 NOTE — Care Management Obs Status (Signed)
[place] NOTIFICATION   Patient Details  Name: [name] MRN: [id number] Date of Birth: 07-06-1940   Medicare Observation Status Notification Given:  Yes (MOON letter given)    Mardene Speak, RN 08/08/2016, 1:14 PM

## 2016-08-08 NOTE — Progress Notes (Signed)
Slept at long intervals today. Family in. No co's/issues.

## 2016-08-09 ENCOUNTER — Ambulatory Visit: Payer: Medicare Other | Admitting: Family Medicine

## 2016-08-09 ENCOUNTER — Telehealth: Payer: Self-pay

## 2016-08-09 DIAGNOSIS — J189 Pneumonia, unspecified organism: Secondary | ICD-10-CM | POA: Diagnosis not present

## 2016-08-09 DIAGNOSIS — J449 Chronic obstructive pulmonary disease, unspecified: Secondary | ICD-10-CM | POA: Diagnosis not present

## 2016-08-09 DIAGNOSIS — R531 Weakness: Secondary | ICD-10-CM | POA: Diagnosis not present

## 2016-08-09 DIAGNOSIS — I10 Essential (primary) hypertension: Secondary | ICD-10-CM | POA: Diagnosis not present

## 2016-08-09 LAB — URINE CULTURE: Culture: NO GROWTH

## 2016-08-09 MED ORDER — HEPARIN SOD (PORK) LOCK FLUSH 100 UNIT/ML IV SOLN
500.0000 [IU] | Freq: Once | INTRAVENOUS | Status: AC
  Administered 2016-08-09: 12:00:00 500 [IU] via INTRAVENOUS
  Filled 2016-08-09: qty 5

## 2016-08-09 NOTE — Telephone Encounter (Signed)
Pt came home from hospital today. He is supposed to have labs and chemo tomorrow. Antony Haste, daughter and POA does not feel he is ready to receive chemo. Monday he couldn't stand up. He went to ER on Wednesday, c/o pains in chest that was 10%collapsed lung and pneumonia. He went home on Thursday and back in ER on Saturday with dehydration. Now he is not eating, is very weak. Magda Paganini feels like he needs to stop chemo but pt wants to talk to [place] before he stops. Where do they go from here.   Chemo is tomorrow at [facility]. Next OV 6/5 lab/flush/BS/ inf/nut  Leslie's phone [phone]  S/w Dr Benay Spice. He will call Henriette Combs or tomorrow. We will cancel lab and chemo tomorrow.

## 2016-08-09 NOTE — Care Management (Signed)
Admitted to [facility] with the diagnosis of weakness under observation status. Lives alone, but daughter lives next door. Daughter is Magda Paganini [phone]). Last seen Dr. Glori Bickers a month ago. Prescriptions are filled at [facility]. No home health. No skilled facility, No home oxygen. Uses a cane to aid in ambulation. Takes care of all basic and instrumental activities of daily living himself, drives but is limited in distances. Last fall was 7-8 weeks ago. Fair appetite. Port in place. Physical therapy evaluation completed. Recommending home with home health and physical therapy. Discussed the limitations on arranging home health with [facility] insurance. [place]. Discussed that this was his second admission for May, "try to arrange home health and physical therapy." Will fax to [facility]. Discharge to home today per Dr. Darvin Neighbours. Daughter will transport. Shelbie Ammons RN MSN CCM Care Management [phone]

## 2016-08-10 ENCOUNTER — Telehealth: Payer: Self-pay

## 2016-08-10 ENCOUNTER — Encounter: Payer: Self-pay | Admitting: Internal Medicine

## 2016-08-10 ENCOUNTER — Other Ambulatory Visit: Payer: Medicare Other

## 2016-08-10 ENCOUNTER — Ambulatory Visit: Payer: Medicare Other

## 2016-08-10 NOTE — Telephone Encounter (Signed)
By the time I tried to call, [place] with [facility], they had left for the day and I only got the answering service.  Please try to call them today to encourage them to try to get out to his home as soon as they can.

## 2016-08-10 NOTE — Telephone Encounter (Signed)
Darlina Guys with [facility] said there is a delay in PT doing eval on pt; PT can go out on 08/12/16 for eval. Darlina Guys request cb if that is OK with Dr Glori Bickers.

## 2016-08-10 NOTE — Discharge Summary (Signed)
[facility] [facility] [place] NAME: [name]    MR#:  [id number]  DATE OF BIRTH:  09/29/1940  DATE OF ADMISSION:  08/07/2016 ADMITTING PHYSICIAN: Lance Coon, MD  DATE OF DISCHARGE: 08/09/2016  1:13 PM  PRIMARY CARE PHYSICIAN: Tower, Wynelle Fanny, MD   ADMISSION DIAGNOSIS:  Dehydration [E86.0] Hypomagnesemia [E83.42] Generalized weakness [R53.1]  DISCHARGE DIAGNOSIS:  Principal Problem:   Weakness Active Problems:   HYPERTENSION, BENIGN   Chronic obstructive pulmonary disease (HCC)   Atrial flutter (HCC)   Chronic systolic heart failure (HCC)   Pneumonia   SECONDARY DIAGNOSIS:   Past Medical History:  Diagnosis Date  . AAA (abdominal aortic aneurysm) (HCC)    4.2 cm IR AAA noted on 01/13/15 PET scan  . Anxiety   . CAD (coronary artery disease)    AMI 1997 w/ BMS x 2 LAD, BMS CFX 2002, DES x 2 LAD 2009, DES CFX & OM 2012   . Carotid artery disease ([place])    RICA CEA 4174, LICA CEA 0814 per Dr. [name],   . COPD (chronic obstructive pulmonary disease) ([place])   . GERD (gastroesophageal reflux disease)    occ  . HTN (hypertension)   . Hydradenitis   . Hyperlipidemia   . Ischemic cardiomyopathy    EF 40-45 percent at cath 2012  . Myocardial infarction ([place])   . Non-small cell lung cancer (NSCLC) ([place]) dx'd 12/2014   SBRT  . Osteoarthritis    pt. denies 01/17/2015  . PAC (premature atrial contraction)   . Peripheral vascular disease ([place])   . Pneumonia   . Prostate cancer ([place]) dx'd approx 2008   xrt throughfoley  . Radiation 02/18/15-02/25/15   right upper lung 54 Gy  . Shortness of breath dyspnea   . Stroke [facility]) 11-24-11   "no feeling in right 5th digit"  . Urinary frequency   . Urinary urgency      ADMITTING HISTORY  HISTORY OF PRESENT ILLNESS:  [name]  is a 76 y.o. male who presents with Generalized weakness and an episode of near-syncope. Patient was discharged within the last 48 hours after treatment here for  pneumonia. He was sent home with antibiotics and prednisone. He states that he got up to go to the bathroom today and felt very lightheaded, and then became weak and fell. His daughter witnessed the event and was with him and caught him. His daughter who is at bedside today states that he did not actually lose consciousness. In the ED he was found to be somewhat hypomagnesemic. He is also dehydrated. He denies any symptoms of chest pain, diaphoresis, palpitations.  HOSPITAL COURSE:   * Dehydration/hypokalemia due to decreased PO intake Causing pre-syncope and generalized weakness. IVF and PO supplements with improved weakness. Seen by PT and [facility] PT set up. Encouraged to increase PO intake.  * Pneumonia, recent diagnosis Continue levaquin as previously prescribed. 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  CONSULTS OBTAINED:    DRUG ALLERGIES:   Allergies  Allergen Reactions  . Augmentin [Amoxicillin-Pot Clavulanate] Diarrhea, Nausea And Vomiting and Other (See Comments)    Has patient had a PCN reaction causing immediate rash, facial/tongue/throat swelling, SOB or lightheadedness with hypotension: No Has patient had a PCN reaction causing severe rash involving  mucus membranes or skin necrosis: No Has patient had a PCN reaction that required hospitalization No Has patient had a PCN reaction occurring within the last 10 years: Yes If all of the above answers are "NO", then may proceed with Cephalosporin use.   . Chantix [Varenicline] Other (See Comments)    Violent nightmares  . Sulfonamide Derivatives Other (See Comments)    Pt do not remember 8-30 md  . Zantac [Ranitidine Hcl] Diarrhea    DISCHARGE MEDICATIONS:   Discharge Medication List as of 08/09/2016 12:02 PM     CONTINUE these medications which have NOT CHANGED   Details  acetaminophen (TYLENOL) 500 MG tablet Take 1,000 mg by mouth every 4 (four) hours as needed for mild pain, moderate pain, fever or headache. , Starting Fri 10/17/2015, Historical Med    ALPRAZolam (XANAX) 0.5 MG tablet Take 0.5-1 tablets (0.25-0.5 mg total) by mouth daily as needed for anxiety or sleep., Starting Tue 05/18/2016, Phone In    clopidogrel (PLAVIX) 75 MG tablet TAKE 1 TABLET BY MOUTH EVERY DAY, Normal    diphenoxylate-atropine (LOMOTIL) 2.5-0.025 MG tablet Take 1-2 tablets by mouth 4 (four) times daily as needed for diarrhea or loose stools., Starting Tue 06/15/2016, Phone In    fludrocortisone (FLORINEF) 0.1 MG tablet Take 1 tablet (0.1 mg total) by mouth daily., Starting Thu 08/05/2016, Normal    fluticasone-salmeterol (ADVAIR HFA) 115-21 MCG/ACT inhaler Inhale 2 puffs into the lungs 2 (two) times daily., Historical Med    ipratropium-albuterol (DUONEB) 0.5-2.5 (3) MG/3ML SOLN Take 3 mLs by nebulization every 4 (four) hours as needed (shortness of breath and wheezing)., Historical Med    levofloxacin (LEVAQUIN) 750 MG tablet Take 1 tablet (750 mg total) by mouth daily., Starting Thu 08/05/2016, Normal    lidocaine (XYLOCAINE) 2 % solution Use as directed 20 mLs in the mouth or throat as needed for mouth pain., Historical Med    lidocaine-prilocaine (EMLA) cream Apply to port site one hour prior to use. Do not rub in. Cover with plastic., Normal    loratadine (CLARITIN) 10 MG tablet TAKE 1 TABLET BY MOUTH EVERY DAY, Normal    mirtazapine (REMERON) 15 MG tablet TAKE 1 TABLET (15 MG TOTAL) BY MOUTH AT BEDTIME., Starting Mon 07/05/2016, Normal    Multiple Vitamin (MULTIVITAMIN WITH MINERALS) TABS tablet Take 1 tablet by mouth daily., Historical Med    nitroGLYCERIN (NITROSTAT) 0.4 MG SL tablet Place 0.4 mg under the tongue every 5 (five) minutes as needed for chest pain., Historical Med    omeprazole (PRILOSEC) 40 MG  capsule TAKE 1 CAPSULE 30 MINUTES BEFORE BREAKFAST AND DINNER, Normal    potassium chloride 20 MEQ/15ML (10%) SOLN Take 15 mLs (20 mEq total) by mouth daily., Starting Fri 08/06/2016, Normal    !! predniSONE (DELTASONE) 50 MG tablet Take 1 tablet (50 mg total) by mouth daily with breakfast., Starting Thu 08/05/2016, Until Mon 08/09/2016, Print    prochlorperazine (COMPAZINE) 5 MG tablet Take 1 tablet (5 mg total) by mouth every 6 (six) hours as needed for nausea or vomiting., Starting Tue 07/27/2016, Normal    tiotropium (SPIRIVA) 18 MCG inhalation capsule Place 1 capsule (18 mcg total) into inhaler and inhale daily., Starting Tue 02/24/2016, Print    traMADol (ULTRAM) 50 MG tablet Take 1 tablet (50 mg total) by mouth every 6 (six) hours as needed., Starting Thu 07/29/2016, Phone In    VENTOLIN HFA 108 (90 Base) MCG/ACT inhaler INHALE 2 PUFFS INTO THE LUNGS EVERY 4 (FOUR) HOURS  AS NEEDED FOR WHEEZING OR SHORTNESS OF BREATH., Normal    !! predniSONE (DELTASONE) 20 MG tablet Take 0.5 tablets (10 mg total) by mouth every other day. Please continue after you take 50 mg prednisone for 5 days To stimulate appetite, Starting Thu 08/05/2016, No Print     !! - Potential duplicate medications found. Please discuss with provider.      Today   VITAL SIGNS:  Blood pressure (!) 154/91, pulse 76, temperature 98 F (36.7 C), temperature source Oral, resp. rate 19, height '5\' 8"'$  (1.727 m), weight 54.8 kg (120 lb 12.8 oz), SpO2 97 %.  I/O:  No intake or output data in the 24 hours ending 08/10/16 1509  PHYSICAL EXAMINATION:  Physical Exam  GENERAL:  76 y.o.-year-old patient lying in the bed with no acute distress.  LUNGS: Normal breath sounds bilaterally, no wheezing, rales,rhonchi or crepitation. No use of accessory muscles of respiration.  CARDIOVASCULAR: S1, S2 normal. No murmurs, rubs, or gallops.  ABDOMEN: Soft, non-tender, non-distended. Bowel sounds present. No organomegaly or mass.  NEUROLOGIC:  Moves all 4 extremities. PSYCHIATRIC: The patient is alert and oriented x 3.  SKIN: No obvious rash, lesion, or ulcer.   DATA REVIEW:   CBC  Recent Labs Lab 08/08/16 0514  WBC 5.5  HGB 7.7*  HCT 23.3*  PLT 412    Chemistries   Recent Labs Lab 08/07/16 1652 08/08/16 0514  NA 135 134*  K 3.4* 3.7  CL 97* 102  CO2 26 27  GLUCOSE 109* 143*  BUN 15 15  CREATININE 1.01 0.82  CALCIUM 8.5* 8.2*  MG 1.5*  --   AST 23  --   ALT 10*  --   ALKPHOS 79  --   BILITOT 0.4  --     Cardiac Enzymes  Recent Labs Lab 08/07/16 1652  TROPONINI <0.03    Microbiology Results  Results for orders placed or performed during the hospital encounter of 08/07/16  Urine culture     Status: None   Collection Time: 08/07/16  4:58 PM  Result Value Ref Range Status   Specimen Description URINE, RANDOM  Final   Special Requests NONE  Final   Culture   Final    NO GROWTH Performed at [facility], [place] [address], [place], [place] [address]    Report Status 08/09/2016 FINAL  Final    RADIOLOGY:  No results found.  Follow up with PCP in 1 week.  Management plans discussed with the patient, family and they are in agreement.  CODE STATUS:  Code Status History    Date Active Date Inactive Code Status Order ID Comments User Context   08/07/2016  9:50 PM 08/09/2016  4:19 PM Full Code [id number]  Lance Coon, MD ED   08/04/2016  6:50 PM 08/05/2016  6:55 PM Full Code [id number]  Henreitta Leber, MD Inpatient   01/23/2016  5:30 PM 01/25/2016  4:48 PM Full Code [id number]  Epifanio Lesches, MD ED   11/20/2015 12:33 AM 11/20/2015  7:59 PM Full Code [id number]  [place], Ubaldo Glassing, DO Inpatient   10/30/2015  2:53 PM 11/01/2015  5:14 PM Full Code [id number]  Thurnell Lose, MD Inpatient   11/24/2011  3:30 PM 11/27/2011  1:13 PM Full Code [id number]  Lucienne Capers, RN Inpatient    Advance Directive Documentation     Most Recent Value  Type of Advance Directive  Healthcare Power of Attorney   Pre-existing out of facility DNR order (yellow form  or pink MOST form)  -  "MOST" Form in Place?  -      TOTAL TIME TAKING CARE OF THIS PATIENT ON DAY OF DISCHARGE: more than 30 minutes.   Hillary Bow R M.D on 08/10/2016 at 3:09 PM  Between 7am to 6pm - Pager - [phone]  After 6pm go to [email or web address] - password [email or web address] [facility]  Office  [phone]  CC: Primary care physician; Tower, Wynelle Fanny, MD  Note: This dictation was prepared with Dragon dictation along with smaller phrase technology. Any transcriptional errors that result from this process are unintentional.

## 2016-08-10 NOTE — Telephone Encounter (Signed)
I will see him tomorrow as planned and we can work on rehab/PT referral (home based if he is homebound) Too late to initiate anything today If condition worsens-do go to ED He should have someone stay with him until his visit tomorrow and use a walker at all times if he has one

## 2016-08-10 NOTE — Telephone Encounter (Signed)
Thanks- I was not aware that PT was already on the case - I got a message that he fell twice today and just want to make sure he has supervision  Please enc PT to get out as soon as they can  I will see pt for his 6 pm visit tomorrow as planned

## 2016-08-10 NOTE — Telephone Encounter (Signed)
Magda Paganini (DPR signed) left v/m that pt lives alone and has fallen x 2 today; Magda Paganini and pt thinks there is a need for rehab. Pt has 15' hospital f/u for TCM on 08/11/16 at [facility]. Please advise.

## 2016-08-11 ENCOUNTER — Ambulatory Visit (INDEPENDENT_AMBULATORY_CARE_PROVIDER_SITE_OTHER): Payer: Medicare Other | Admitting: Family Medicine

## 2016-08-11 ENCOUNTER — Encounter: Payer: Self-pay | Admitting: *Deleted

## 2016-08-11 ENCOUNTER — Encounter: Payer: Self-pay | Admitting: Family Medicine

## 2016-08-11 VITALS — BP 100/58 | HR 94 | Temp 97.8°F | Ht 68.0 in | Wt 124.5 lb

## 2016-08-11 DIAGNOSIS — I951 Orthostatic hypotension: Secondary | ICD-10-CM

## 2016-08-11 DIAGNOSIS — I779 Disorder of arteries and arterioles, unspecified: Secondary | ICD-10-CM

## 2016-08-11 DIAGNOSIS — R531 Weakness: Secondary | ICD-10-CM

## 2016-08-11 DIAGNOSIS — R790 Abnormal level of blood mineral: Secondary | ICD-10-CM | POA: Diagnosis not present

## 2016-08-11 DIAGNOSIS — I739 Peripheral vascular disease, unspecified: Secondary | ICD-10-CM

## 2016-08-11 DIAGNOSIS — F17210 Nicotine dependence, cigarettes, uncomplicated: Secondary | ICD-10-CM

## 2016-08-11 DIAGNOSIS — Z720 Tobacco use: Secondary | ICD-10-CM

## 2016-08-11 DIAGNOSIS — C155 Malignant neoplasm of lower third of esophagus: Secondary | ICD-10-CM | POA: Diagnosis not present

## 2016-08-11 DIAGNOSIS — E860 Dehydration: Secondary | ICD-10-CM

## 2016-08-11 DIAGNOSIS — D649 Anemia, unspecified: Secondary | ICD-10-CM | POA: Diagnosis not present

## 2016-08-11 DIAGNOSIS — C3411 Malignant neoplasm of upper lobe, right bronchus or lung: Secondary | ICD-10-CM

## 2016-08-11 DIAGNOSIS — I483 Typical atrial flutter: Secondary | ICD-10-CM

## 2016-08-11 DIAGNOSIS — F413 Other mixed anxiety disorders: Secondary | ICD-10-CM

## 2016-08-11 DIAGNOSIS — J189 Pneumonia, unspecified organism: Secondary | ICD-10-CM | POA: Diagnosis not present

## 2016-08-11 DIAGNOSIS — I5022 Chronic systolic (congestive) heart failure: Secondary | ICD-10-CM

## 2016-08-11 DIAGNOSIS — F329 Major depressive disorder, single episode, unspecified: Secondary | ICD-10-CM

## 2016-08-11 NOTE — Telephone Encounter (Signed)
Darlina Guys notified of Dr. Marliss Coots comments and verbalized understanding. PT eval is set for tomorrow but given his falls she is going to contact the main therapist and see if they can eval him sooner

## 2016-08-11 NOTE — Telephone Encounter (Signed)
Magda Paganini notified of Dr. Marliss Coots recommendations and instructions and verbalized understanding

## 2016-08-11 NOTE — Progress Notes (Signed)
Subjective:    Patient ID: [name], male    DOB: 07-04-1940, 76 y.o.   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)  - s/p pneumonia  He presented on day of admission with weakness and episode of pre syncope within 48 hours of d/c after tx of pneumonia  He was found to be dehydrated and hypomagnesic (1.5) Hx of decreased po intake   Was taking a course of levaquin for pneumonia - after his first chemo treatment   Lab Results  Component Value Date   WBC 5.5 08/08/2016   HGB 7.7 (L) 08/08/2016   HCT 23.3 (L) 08/08/2016   MCV 85.3 08/08/2016   PLT 412 08/08/2016  had appt tues with oncologist-did not go  Next week is week off  Then he will get a treatment the following week      Chemistry      Component Value Date/Time   NA 134 (L) 08/08/2016 0514   NA 139 07/27/2016 0800   K 3.7 08/08/2016 0514   K 3.6 07/27/2016 0800   CL 102 08/08/2016 0514   CO2 27 08/08/2016 0514   CO2 26 07/27/2016 0800   BUN 15 08/08/2016 0514   BUN 16.6 07/27/2016 0800   CREATININE 0.82 08/08/2016 0514   CREATININE 0.7 07/27/2016 0800      Component Value Date/Time   CALCIUM 8.2 (L) 08/08/2016 0514   CALCIUM 8.7 07/27/2016 0800   ALKPHOS 79 08/07/2016 1652   ALKPHOS 82 07/27/2016 0800   AST 23 08/07/2016 1652   AST 13 07/27/2016 0800   ALT 10 (L) 08/07/2016 1652   ALT 10 07/27/2016 0800   BILITOT 0.4 08/07/2016 1652   BILITOT <0.22 07/27/2016 0800     troponin was neg   Urine culture negative   CXR showed R sided opacity Study Result   CLINICAL DATA:  Weakness, recent pneumonia and pneumothorax, history of lung cancer  EXAM: CHEST  2 VIEW  COMPARISON:  08/05/2016  FINDINGS: Stable small left apical pneumothorax. Volume loss in the right hemithorax.  Fiducial markers in the right upper lung with adjacent scarring. Small right pleural effusion. Right basilar opacity, atelectasis versus pneumonia.  The heart is normal in size.  Left chest port terminates the cavoatrial junction.  IMPRESSION: Stable small left apical pneumothorax.  Right basilar opacity, atelectasis versus pneumonia.  Small right pleural effusion.   Electronically Signed   By: Julian Hy M.D.   On: 08/07/2016 17:42  CT angio showed no evidence of pulmonary embolus , bilat pulmonary nodules and stable osseous metastases with mild R pleural effusion and poss pneumonia note  Small L pneumonthorax also noted which did resolve      Wt Readings from Last 3 Encounters:  08/11/16 124 lb 8 oz (56.5 kg)  08/09/16 120 lb 12.8 oz (54.8 kg)  08/04/16 122 lb 9.6 oz (55.6 kg)  up 4 lb  Drinking some gatorade and water   Unsteady- hard to get around with a cane  Too tired to get up also    Pt continues to smoke   BP Readings from Last 3 Encounters:  08/11/16 (!) 100/58  08/09/16 (!) 154/91  08/05/16 134/66   Eating is hard  Cannot swallow well  Does better with sweet things   Still smoking- down  to 1 cigarette an hour Is cutting back   Patient Active Problem List   Diagnosis Date Noted  . Dehydration 08/11/2016  . Low magnesium level 08/11/2016  . Pneumonia 08/07/2016  . COPD exacerbation ([place]) 08/04/2016  . Port catheter in place 04/06/2016  . Cancer of lower third of esophagus ([place]) 02/24/2016  . Dysphagia 02/04/2016  . Left arm weakness 12/19/2015  . Numbness and tingling in left arm 12/19/2015  . Left shoulder pain 12/19/2015  . History of recent fall 12/19/2015  . Paresthesia of arm 12/01/2015  . Depression 12/01/2015  . Malnutrition of moderate degree 11/20/2015  . TIA (transient ischemic attack) 11/19/2015  . Weakness 11/17/2015  . Physical deconditioning 11/10/2015  . Multiple rib fractures 11/10/2015  . Chronic systolic  heart failure ([place]) 11/02/2015  . Atrial flutter ([place]) 10/30/2015  . B12 deficiency 07/22/2015  . Anemia 07/15/2015  . Hematuria 07/08/2015  . Unintentional weight loss 07/08/2015  . Hypotension 07/08/2015  . COPD with emphysema ([place]) 05/22/2015  . Cough 05/22/2015  . Tobacco user 05/22/2015  . Mediastinal lymphadenopathy 01/22/2015  . 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 S/P bronchoscopy with biopsy   . Colon cancer screening 11/06/2014  . Encounter for Medicare annual wellness exam 11/06/2014  . Routine general medical examination at a health care facility 11/06/2014  . Lung nodule 06/28/2014  . Carotid stenosis 12/20/2013  . Goals of care, counseling/discussion 12/20/2013  . Aftercare following surgery of the circulatory system, [place] 12/19/2012  . Personal history of colonic polyps 08/28/2012  . Hyperglycemia 08/07/2012  . Pre-operative cardiovascular examination 11/03/2011  . Carotid artery disease ([place]) 05/20/2011  . Grief reaction 05/07/2011  . Ischemic cardiomyopathy 01/13/2011  . Palpitations 11/10/2010  . Occlusion and stenosis of carotid artery without mention of cerebral infarction 04/02/2010  . Anxiety disorder 12/12/2009  . HYPERTENSION, BENIGN 09/29/2008  . History of prostate cancer 09/19/2008  . Hyperlipidemia 02/10/2007  . AMAUROSIS FUGAX 02/10/2007  . MYOCARDIAL INFARCTION, HX OF 02/10/2007  . Coronary atherosclerosis 02/10/2007  . PERIPHERAL VASCULAR DISEASE 02/10/2007  . HEMORRHOIDS 02/10/2007  . Chronic obstructive pulmonary disease ([place]) 02/10/2007  . OSTEOARTHRITIS 02/10/2007  . Nicotine dependence 02/10/2007  . Pure hypercholesterolemia 01/27/2007  . CORONARY ATHEROSCLEROSIS NATIVE CORONARY ARTERY 01/27/2007   Past Medical History:  Diagnosis Date  . AAA (abdominal aortic aneurysm) (HCC)    4.2 cm IR AAA noted on 01/13/15 PET scan  . Anxiety   . CAD (coronary artery disease)    AMI 1997 w/ BMS x 2 LAD, BMS CFX 2002, DES x 2 LAD 2009,  DES CFX & OM 2012   . Carotid artery disease ([place])    RICA CEA 8588, LICA CEA 5027 per Dr. [name],   . COPD (chronic obstructive pulmonary disease) ([place])   . GERD (gastroesophageal reflux disease)    occ  . HTN (hypertension)   . Hydradenitis   . Hyperlipidemia   . Ischemic cardiomyopathy    EF 40-45 percent at cath 2012  . Myocardial infarction ([place])   . Non-small cell lung cancer (NSCLC) ([place]) dx'd 12/2014   SBRT  . Osteoarthritis    pt. denies 01/17/2015  . PAC (premature atrial contraction)   . Peripheral vascular disease ([place])   . Pneumonia   . Prostate cancer ([place]) dx'd approx 2008   xrt throughfoley  . Radiation 02/18/15-02/25/15   right upper lung 54 Gy  . Shortness of breath dyspnea   . Stroke [facility]) 11-24-11   "no feeling  in right 5th digit"  . Urinary frequency   . Urinary urgency    Past Surgical History:  Procedure Laterality Date  . CARDIAC CATHETERIZATION    . CARDIOVASCULAR STRESS TEST  12-2006  . CARDIOVERSION N/A 10/31/2015   Procedure: CARDIOVERSION;  Surgeon: Sueanne Margarita, MD;  Location: [place];  Service: Cardiovascular;  Laterality: N/A;  . CAROTID ENDARTERECTOMY Right 03-2000   CE  . CAROTID ENDARTERECTOMY Left 11-24-11   CE  . COLONOSCOPY  04-2001   polyp  . [place], 2002, 2009, 2012   8 stents total  . [place]   neg  . ENDARTERECTOMY  11/24/2011   Procedure: ENDARTERECTOMY CAROTID;  Surgeon: Rosetta Posner, MD;  Location: [place];  Service: Vascular;  Laterality: Left;  . EYE SURGERY Bilateral Aug. 17, 2015   Cataract  . FUDUCIAL PLACEMENT N/A 12/25/2014   Procedure: PLACEMENT OF FUDUCIAL;  Surgeon: Collene Gobble, MD;  Location: [place];  Service: Thoracic;  Laterality: N/A;  . IR GENERIC HISTORICAL  03/18/2016   IR US GUIDE VASC ACCESS LEFT 03/18/2016 Jacqulynn Cadet, MD [facility]-INTERV RAD  . IR GENERIC HISTORICAL  03/18/2016   IR FLUORO GUIDE PORT INSERTION LEFT 03/18/2016 Jacqulynn Cadet, MD [facility]INTER[facility]  RAD  . PROSTATE SURGERY     Radiation Tx  X's 40  . TEE WITHOUT CARDIOVERSION N/A 10/31/2015   Procedure: TRANSESOPHAGEAL ECHOCARDIOGRAM (TEE);  Surgeon: Sueanne Margarita, MD;  Location: [facility] ENDOSCOPY;  Service: Cardiovascular;  Laterality: N/A;  . VASCULAR SURGERY    . VIDEO BRONCHOSCOPY WITH ENDOBRONCHIAL NAVIGATION N/A 12/25/2014   Procedure: VIDEO BRONCHOSCOPY WITH ENDOBRONCHIAL NAVIGATION  with Biopsies and Brushings;  Surgeon: Collene Gobble, MD;  Location: [place];  Service: Thoracic;  Laterality: N/A;  . VIDEO BRONCHOSCOPY WITH ENDOBRONCHIAL ULTRASOUND N/A 01/22/2015   Procedure: VIDEO BRONCHOSCOPY WITH ENDOBRONCHIAL ULTRASOUND with Biopsies;  Surgeon: Collene Gobble, MD;  Location: [place] OR;  Service: Thoracic;  Laterality: N/A;   Social History  Substance Use Topics  . Smoking status: Current Every Day Smoker    Packs/day: 1.00    Years: 55.00    Types: Cigarettes  . Smokeless tobacco: Never Used     Comment: 1/2 ppd (01/07/15)  . Alcohol use 0.0 oz/week     Comment: occ   Family History  Problem Relation Age of Onset  . Stroke Father   . Heart disease Father   . Heart disease Mother        pacemaker  . Other Brother        benign brain tumor  . Cancer Brother        Eye-behind  . Cancer Sister        Breast  . Liver cancer Brother   . Diabetes Maternal Grandmother   . Colon polyps Neg Hx   . Esophageal cancer Neg Hx    Allergies  Allergen Reactions  . Augmentin [Amoxicillin-Pot Clavulanate] Diarrhea, Nausea And Vomiting and Other (See Comments)    Has patient had a PCN reaction causing immediate rash, facial/tongue/throat swelling, SOB or lightheadedness with hypotension: No Has patient had a PCN reaction causing severe rash involving mucus membranes or skin necrosis: No Has patient had a PCN reaction that required hospitalization No Has patient had a PCN reaction occurring within the last 10 years: Yes If all of the above answers are "NO", then may proceed with  Cephalosporin use.   . Chantix [Varenicline] Other (See Comments)    Violent  nightmares  . Sulfonamide Derivatives Other (See Comments)    Pt do not remember 8-30 md  . Zantac [Ranitidine Hcl] Diarrhea   Current Outpatient Prescriptions on File Prior to Visit  Medication Sig Dispense Refill  . acetaminophen (TYLENOL) 500 MG tablet Take 1,000 mg by mouth every 4 (four) hours as needed for mild pain, moderate pain, fever or headache.     . ALPRAZolam (XANAX) 0.5 MG tablet Take 0.5-1 tablets (0.25-0.5 mg total) by mouth daily as needed for anxiety or sleep. 30 tablet 0  . clopidogrel (PLAVIX) 75 MG tablet TAKE 1 TABLET BY MOUTH EVERY DAY 30 tablet 7  . diphenoxylate-atropine (LOMOTIL) 2.5-0.025 MG tablet Take 1-2 tablets by mouth 4 (four) times daily as needed for diarrhea or loose stools. 60 tablet 0  . fludrocortisone (FLORINEF) 0.1 MG tablet Take 1 tablet (0.1 mg total) by mouth daily. 30 tablet 3  . fluticasone-salmeterol (ADVAIR HFA) 115-21 MCG/ACT inhaler Inhale 2 puffs into the lungs 2 (two) times daily.    Marland Kitchen ipratropium-albuterol (DUONEB) 0.5-2.5 (3) MG/3ML SOLN Take 3 mLs by nebulization every 4 (four) hours as needed (shortness of breath and wheezing).    Marland Kitchen levofloxacin (LEVAQUIN) 750 MG tablet Take 1 tablet (750 mg total) by mouth daily. 5 tablet 0  . lidocaine (XYLOCAINE) 2 % solution Use as directed 20 mLs in the mouth or throat as needed for mouth pain.    Marland Kitchen lidocaine-prilocaine (EMLA) cream Apply to port site one hour prior to use. Do not rub in. Cover with plastic. 30 g 2  . loratadine (CLARITIN) 10 MG tablet TAKE 1 TABLET BY MOUTH EVERY DAY 30 tablet 5  . mirtazapine (REMERON) 15 MG tablet TAKE 1 TABLET (15 MG TOTAL) BY MOUTH AT BEDTIME. 30 tablet 6  . Multiple Vitamin (MULTIVITAMIN WITH MINERALS) TABS tablet Take 1 tablet by mouth daily.    . nitroGLYCERIN (NITROSTAT) 0.4 MG SL tablet Place 0.4 mg under the tongue every 5 (five) minutes as needed for chest pain.    Marland Kitchen omeprazole  (PRILOSEC) 40 MG capsule TAKE 1 CAPSULE 30 MINUTES BEFORE BREAKFAST AND DINNER 180 capsule 1  . potassium chloride 20 MEQ/15ML (10%) SOLN Take 15 mLs (20 mEq total) by mouth daily. 450 mL 0  . predniSONE (DELTASONE) 20 MG tablet Take 0.5 tablets (10 mg total) by mouth every other day. Please continue after you take 50 mg prednisone for 5 days To stimulate appetite    . prochlorperazine (COMPAZINE) 5 MG tablet Take 1 tablet (5 mg total) by mouth every 6 (six) hours as needed for nausea or vomiting. 30 tablet 1  . tiotropium (SPIRIVA) 18 MCG inhalation capsule Place 1 capsule (18 mcg total) into inhaler and inhale daily. 30 capsule 5  . traMADol (ULTRAM) 50 MG tablet Take 1 tablet (50 mg total) by mouth every 6 (six) hours as needed. 50 tablet 0  . VENTOLIN HFA 108 (90 Base) MCG/ACT inhaler INHALE 2 PUFFS INTO THE LUNGS EVERY 4 (FOUR) HOURS AS NEEDED FOR WHEEZING OR SHORTNESS OF BREATH. 18 g 5   No current facility-administered medications on file prior to visit.      Review of Systems Review of Systems  Constitutional: Negative for fever, appetite change, and unexpected weight change. pos for fatigue and symptoms of malnutrition  Eyes: Negative for pain and visual disturbance.  Respiratory: Negative for cough and pos for baseline  shortness of breath.   Cardiovascular: Negative for cp or palpitations    Gastrointestinal: Negative for nausea,  diarrhea and constipation.  Genitourinary: Negative for urgency and frequency. neg for dysuria  Skin: Negative for pallor or rash   Neurological: Negative for  numbness and headaches. pos for gen weakness and lightheadedness  Hematological: Negative for adenopathy. Does not bruise/bleed easily.  Psychiatric/Behavioral: pos for dysphoric and anxious mood          Objective:   Physical Exam  Constitutional: He appears well-developed and well-nourished. No distress.  Frail appearing elderly male in wheelchair   HENT:  Head: Normocephalic and  atraumatic.  Mouth/Throat: Oropharynx is clear and moist.  Eyes: Conjunctivae and EOM are normal. Pupils are equal, round, and reactive to light.  Neck: Normal range of motion. Neck supple. No JVD present. Carotid bruit is present. No thyromegaly present.  Cardiovascular: Regular rhythm, normal heart sounds and intact distal pulses.  Exam reveals no gallop.   Rate of 94  Pulmonary/Chest: Effort normal and breath sounds normal. No respiratory distress. He has no wheezes. He has no rales.  Diffusely distant bs   Diffuse wheezes Dulled bs at R base  Abdominal: Soft. Bowel sounds are normal. He exhibits no distension, no abdominal bruit and no mass. There is no tenderness.  Musculoskeletal: He exhibits no edema.  Lymphadenopathy:    He has no cervical adenopathy.  Neurological: He is alert. He has normal reflexes. No cranial nerve deficit. He exhibits normal muscle tone. Coordination normal.  Generally weak Cannot rise of walk without assistance on both sides   Skin: Skin is warm and dry. No rash noted. There is pallor.  Psychiatric: His mood appears anxious. His affect is blunt. His speech is delayed. He is slowed and withdrawn. Thought content is not paranoid. He expresses inappropriate judgment. He exhibits a depressed mood. He expresses no homicidal and no suicidal ideation.  Tangential and somewhat inattentive Irritable with his family   Capability of rational thought may be impaired           Assessment & Plan:   Problem List Items Addressed This Visit      Cardiovascular and Mediastinum   Atrial flutter (HCC)    Currently I sinus rhythm On plavix       Carotid artery disease (HCC)    Followed by cardiology      Chronic systolic heart failure (HCC)    Currently stable       Hypotension - Primary    Recent dehydration  Clinically improving BP: (!) 100/58    Continue to follow Stressed imp of re hydration         Respiratory   Pneumonia    R basliar  pneumonia in pt with copd who still smokes S/p 2 hospitalizations Reviewed hospital records, lab results and studies in detail   Clinically improved with 1 dose of levaquin left Labs toay  Stable exam Good pulse ox on ra of 97% Too early for repeat cxr For PT assessment at home tomorrow Continue to follow      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      Anxiety disorder    Currently on mirtazapine and xanax Afraid to quit smoking due to anxiety      Dehydration    Reviewed hospital records, lab results and studies in detail  Gradually improving but not making full effort to hydrate  Disc attitude /effort and barriers to hydration  He wants to get strong enough to receive chemo again  Enc 64 oz fluids per day Family agreed to measure out fluids- put in a cooler for him       Relevant Orders   Comprehensive metabolic panel   Depression    Pt denies outright depression but he seems quite clinically depressed  Suspect this is the reason for dec motivation to eat/drink and quit smoking  This is upsetting to family  Suspect his judgment is impaired  Inattentive and tangential in speech Reviewed stressors/ coping techniques/symptoms/ support sources/ tx options and side effects in detail today  Will plan to increase mirtazapine to 30 mg daily if he is open to it Discussed expectations of this  medication including time to effectiveness and mechanism of action, also poss of side effects (early and late)- including  mental fuzziness, weight or appetite change, nausea and poss of worse dep or anxiety (even suicidal thoughts)  Pt voiced understanding and will stop med and update if this occurs   Family is in agreement  Addressed some social issues as well       Low magnesium level    This was replaced in ED Re check today  Enc balance diet with supplementation if needed       Relevant Orders   Magnesium   Nicotine dependence    Disc in detail risks of smoking and possible outcomes including copd, vascular/ heart disease, cancer , respiratory and sinus infections  Pt voices understanding He is not ready to quit  Disc how much his breathing would improve quite quickly       Tobacco user   Weakness    Generalized weakness with cancer/chemo and dehydration as well as improving pneumonia  PT/OT to eval tomorrow for home care  Stressed strongly that he eat and drink to gain strength in order to be ready for chemo in 3 weeks He seems relatively unmotivated at this time unfortunately Reviewed hospital records, lab results and studies in detail

## 2016-08-11 NOTE — Progress Notes (Signed)
Dr. Benay Spice called patient's daughter to speak with her regarding patient's condition the evening of 08/10/16.

## 2016-08-11 NOTE — Patient Instructions (Addendum)
We need you to get a lot more fluids - optimally 64 oz of clear fluids   Think about using a cooler if necessary -for that and food as well - for the first week or two   Have your assessment for PT tomorrow   Talk about a strategy to quit smoking- cold [place] with patches or weaning   Goal is to get you stronger so you are ready for your next treatment   Labs today   If you want to increase remeron (mirtazapine) to 30 mg each evening for depression - let me know how it goes

## 2016-08-12 DIAGNOSIS — I2510 Atherosclerotic heart disease of native coronary artery without angina pectoris: Secondary | ICD-10-CM | POA: Diagnosis not present

## 2016-08-12 DIAGNOSIS — I4892 Unspecified atrial flutter: Secondary | ICD-10-CM | POA: Diagnosis not present

## 2016-08-12 DIAGNOSIS — Z955 Presence of coronary angioplasty implant and graft: Secondary | ICD-10-CM | POA: Diagnosis not present

## 2016-08-12 DIAGNOSIS — I252 Old myocardial infarction: Secondary | ICD-10-CM | POA: Diagnosis not present

## 2016-08-12 DIAGNOSIS — I491 Atrial premature depolarization: Secondary | ICD-10-CM | POA: Diagnosis not present

## 2016-08-12 DIAGNOSIS — M79622 Pain in left upper arm: Secondary | ICD-10-CM | POA: Diagnosis not present

## 2016-08-12 DIAGNOSIS — F17210 Nicotine dependence, cigarettes, uncomplicated: Secondary | ICD-10-CM

## 2016-08-12 DIAGNOSIS — I110 Hypertensive heart disease with heart failure: Secondary | ICD-10-CM | POA: Diagnosis not present

## 2016-08-12 DIAGNOSIS — J189 Pneumonia, unspecified organism: Secondary | ICD-10-CM | POA: Diagnosis not present

## 2016-08-12 DIAGNOSIS — I739 Peripheral vascular disease, unspecified: Secondary | ICD-10-CM | POA: Diagnosis not present

## 2016-08-12 DIAGNOSIS — M1991 Primary osteoarthritis, unspecified site: Secondary | ICD-10-CM | POA: Diagnosis not present

## 2016-08-12 DIAGNOSIS — I5022 Chronic systolic (congestive) heart failure: Secondary | ICD-10-CM | POA: Diagnosis not present

## 2016-08-12 DIAGNOSIS — Z8546 Personal history of malignant neoplasm of prostate: Secondary | ICD-10-CM

## 2016-08-12 DIAGNOSIS — I255 Ischemic cardiomyopathy: Secondary | ICD-10-CM | POA: Diagnosis not present

## 2016-08-12 DIAGNOSIS — F419 Anxiety disorder, unspecified: Secondary | ICD-10-CM

## 2016-08-12 DIAGNOSIS — J449 Chronic obstructive pulmonary disease, unspecified: Secondary | ICD-10-CM | POA: Diagnosis not present

## 2016-08-12 DIAGNOSIS — I779 Disorder of arteries and arterioles, unspecified: Secondary | ICD-10-CM | POA: Diagnosis not present

## 2016-08-12 DIAGNOSIS — Z85118 Personal history of other malignant neoplasm of bronchus and lung: Secondary | ICD-10-CM

## 2016-08-12 LAB — COMPREHENSIVE METABOLIC PANEL
ALK PHOS: 79 U/L (ref 39–117)
ALT: 10 U/L (ref 0–53)
AST: 14 U/L (ref 0–37)
Albumin: 3.2 g/dL — ABNORMAL LOW (ref 3.5–5.2)
BUN: 16 mg/dL (ref 6–23)
CALCIUM: 8.6 mg/dL (ref 8.4–10.5)
CO2: 29 mEq/L (ref 19–32)
Chloride: 98 mEq/L (ref 96–112)
Creatinine, Ser: 1.06 mg/dL (ref 0.40–1.50)
GFR: 72.17 mL/min (ref >60.00)
GLUCOSE: 110 mg/dL — AB (ref 70–99)
POTASSIUM: 3.4 meq/L — AB (ref 3.5–5.1)
Sodium: 134 mEq/L — ABNORMAL LOW (ref 135–145)
TOTAL PROTEIN: 5.8 g/dL — AB (ref 6.0–8.3)
Total Bilirubin: 0.2 mg/dL (ref 0.2–1.2)

## 2016-08-12 LAB — MAGNESIUM: MAGNESIUM: 1.8 mg/dL (ref 1.5–2.5)

## 2016-08-12 NOTE — Assessment & Plan Note (Signed)
Recent dehydration  Clinically improving BP: (!) 100/58    Continue to follow Stressed imp of re hydration

## 2016-08-12 NOTE — Assessment & Plan Note (Signed)
This was replaced in ED Re check today  Enc balance diet with supplementation if needed

## 2016-08-12 NOTE — Assessment & Plan Note (Signed)
On break from chemo due to dehydration and pneumonia  Enc strongly to optimize nutrition and fluid intake and do PT to get stronger if he wants to continue chemo  For oncol f/u as planned

## 2016-08-12 NOTE — Assessment & Plan Note (Signed)
R basliar pneumonia in pt with copd who still smokes S/p 2 hospitalizations Reviewed hospital records, lab results and studies in detail   Clinically improved with 1 dose of levaquin left Labs toay  Stable exam Good pulse ox on ra of 97% Too early for repeat cxr For PT assessment at home tomorrow Continue to follow

## 2016-08-12 NOTE — Assessment & Plan Note (Signed)
Currently I sinus rhythm On plavix

## 2016-08-12 NOTE — Assessment & Plan Note (Signed)
Followed by cardiology 

## 2016-08-12 NOTE — Assessment & Plan Note (Signed)
Generalized weakness with cancer/chemo and dehydration as well as improving pneumonia  PT/OT to eval tomorrow for home care  Stressed strongly that he eat and drink to gain strength in order to be ready for chemo in 3 weeks He seems relatively unmotivated at this time unfortunately Reviewed hospital records, lab results and studies in detail

## 2016-08-12 NOTE — Assessment & Plan Note (Addendum)
Pt denies outright depression but he seems quite clinically depressed  Suspect this is the reason for dec motivation to eat/drink and quit smoking  This is upsetting to family  Suspect his judgment is impaired  Inattentive and tangential in speech Reviewed stressors/ coping techniques/symptoms/ support sources/ tx options and side effects in detail today  Will plan to increase mirtazapine to 30 mg daily if he is open to it Discussed expectations of this  medication including time to effectiveness and mechanism of action, also poss of side effects (early and late)- including mental fuzziness, weight or appetite change, nausea and poss of worse dep or anxiety (even suicidal thoughts)  Pt voiced understanding and will stop med and update if this occurs   Family is in agreement  Addressed some social issues as well

## 2016-08-12 NOTE — Assessment & Plan Note (Signed)
Multifactorial- cancer/chronic dz and chemo Oncology continues to follow  Lab Results  Component Value Date   HCT 23.3 (L) 08/08/2016    .

## 2016-08-12 NOTE — Assessment & Plan Note (Signed)
Currently on mirtazapine and xanax Afraid to quit smoking due to anxiety

## 2016-08-12 NOTE — Assessment & Plan Note (Signed)
Disc in detail risks of smoking and possible outcomes including copd, vascular/ heart disease, cancer , respiratory and sinus infections  Pt voices understanding He is not ready to quit  Disc how much his breathing would improve quite quickly

## 2016-08-12 NOTE — Assessment & Plan Note (Signed)
Oncology continues to follow Rev latest CT

## 2016-08-12 NOTE — Assessment & Plan Note (Signed)
Currently stable.

## 2016-08-12 NOTE — Assessment & Plan Note (Signed)
Reviewed hospital records, lab results and studies in detail  Gradually improving but not making full effort to hydrate  Disc attitude /effort and barriers to hydration  He wants to get strong enough to receive chemo again  Enc 64 oz fluids per day Family agreed to measure out fluids- put in a cooler for him

## 2016-08-13 ENCOUNTER — Telehealth: Payer: Self-pay

## 2016-08-13 DIAGNOSIS — J189 Pneumonia, unspecified organism: Secondary | ICD-10-CM | POA: Diagnosis not present

## 2016-08-13 DIAGNOSIS — I491 Atrial premature depolarization: Secondary | ICD-10-CM | POA: Diagnosis not present

## 2016-08-13 DIAGNOSIS — J449 Chronic obstructive pulmonary disease, unspecified: Secondary | ICD-10-CM | POA: Diagnosis not present

## 2016-08-13 DIAGNOSIS — I110 Hypertensive heart disease with heart failure: Secondary | ICD-10-CM | POA: Diagnosis not present

## 2016-08-13 DIAGNOSIS — I2510 Atherosclerotic heart disease of native coronary artery without angina pectoris: Secondary | ICD-10-CM | POA: Diagnosis not present

## 2016-08-13 DIAGNOSIS — I5022 Chronic systolic (congestive) heart failure: Secondary | ICD-10-CM | POA: Diagnosis not present

## 2016-08-13 DIAGNOSIS — I4892 Unspecified atrial flutter: Secondary | ICD-10-CM | POA: Diagnosis not present

## 2016-08-13 DIAGNOSIS — I779 Disorder of arteries and arterioles, unspecified: Secondary | ICD-10-CM | POA: Diagnosis not present

## 2016-08-13 DIAGNOSIS — M1991 Primary osteoarthritis, unspecified site: Secondary | ICD-10-CM | POA: Diagnosis not present

## 2016-08-13 DIAGNOSIS — I255 Ischemic cardiomyopathy: Secondary | ICD-10-CM | POA: Diagnosis not present

## 2016-08-13 DIAGNOSIS — I739 Peripheral vascular disease, unspecified: Secondary | ICD-10-CM | POA: Diagnosis not present

## 2016-08-13 NOTE — Telephone Encounter (Signed)
Please ok those verbal orders  

## 2016-08-13 NOTE — Telephone Encounter (Signed)
Verbal order given to [employer]

## 2016-08-13 NOTE — Telephone Encounter (Signed)
Claiborne Billings PT with [facility] left v/m requesting verbal orders for [facility] PT 2 X a week for 1 week, 3 x a week for 5 weeks and 2 x a week for 2 weeks.

## 2016-08-17 ENCOUNTER — Inpatient Hospital Stay
Admission: EM | Admit: 2016-08-17 | Discharge: 2016-08-20 | DRG: 199 | Disposition: A | Discharge status: Skilled Nursing Facility | Payer: Medicare Other | Attending: Internal Medicine | Admitting: Internal Medicine

## 2016-08-17 ENCOUNTER — Emergency Department: Payer: Medicare Other

## 2016-08-17 ENCOUNTER — Ambulatory Visit: Payer: Medicare Other | Admitting: Family Medicine

## 2016-08-17 ENCOUNTER — Encounter: Payer: Self-pay | Admitting: Emergency Medicine

## 2016-08-17 DIAGNOSIS — E876 Hypokalemia: Secondary | ICD-10-CM | POA: Diagnosis present

## 2016-08-17 DIAGNOSIS — I779 Disorder of arteries and arterioles, unspecified: Secondary | ICD-10-CM | POA: Diagnosis not present

## 2016-08-17 DIAGNOSIS — Z681 Body mass index (BMI) 19 or less, adult: Secondary | ICD-10-CM

## 2016-08-17 DIAGNOSIS — Z7902 Long term (current) use of antithrombotics/antiplatelets: Secondary | ICD-10-CM | POA: Diagnosis not present

## 2016-08-17 DIAGNOSIS — I10 Essential (primary) hypertension: Secondary | ICD-10-CM | POA: Diagnosis present

## 2016-08-17 DIAGNOSIS — F17210 Nicotine dependence, cigarettes, uncomplicated: Secondary | ICD-10-CM | POA: Diagnosis not present

## 2016-08-17 DIAGNOSIS — C155 Malignant neoplasm of lower third of esophagus: Secondary | ICD-10-CM

## 2016-08-17 DIAGNOSIS — Z09 Encounter for follow-up examination after completed treatment for conditions other than malignant neoplasm: Secondary | ICD-10-CM

## 2016-08-17 DIAGNOSIS — D630 Anemia in neoplastic disease: Secondary | ICD-10-CM | POA: Diagnosis not present

## 2016-08-17 DIAGNOSIS — M549 Dorsalgia, unspecified: Secondary | ICD-10-CM

## 2016-08-17 DIAGNOSIS — J309 Allergic rhinitis, unspecified: Secondary | ICD-10-CM | POA: Diagnosis not present

## 2016-08-17 DIAGNOSIS — G893 Neoplasm related pain (acute) (chronic): Secondary | ICD-10-CM | POA: Diagnosis not present

## 2016-08-17 DIAGNOSIS — I5022 Chronic systolic (congestive) heart failure: Secondary | ICD-10-CM | POA: Diagnosis not present

## 2016-08-17 DIAGNOSIS — I255 Ischemic cardiomyopathy: Secondary | ICD-10-CM | POA: Diagnosis present

## 2016-08-17 DIAGNOSIS — I739 Peripheral vascular disease, unspecified: Secondary | ICD-10-CM | POA: Diagnosis present

## 2016-08-17 DIAGNOSIS — E871 Hypo-osmolality and hyponatremia: Secondary | ICD-10-CM | POA: Diagnosis not present

## 2016-08-17 DIAGNOSIS — Z8546 Personal history of malignant neoplasm of prostate: Secondary | ICD-10-CM

## 2016-08-17 DIAGNOSIS — J939 Pneumothorax, unspecified: Secondary | ICD-10-CM | POA: Diagnosis not present

## 2016-08-17 DIAGNOSIS — M545 Low back pain: Secondary | ICD-10-CM | POA: Diagnosis not present

## 2016-08-17 DIAGNOSIS — M6281 Muscle weakness (generalized): Secondary | ICD-10-CM | POA: Diagnosis not present

## 2016-08-17 DIAGNOSIS — Z4682 Encounter for fitting and adjustment of non-vascular catheter: Secondary | ICD-10-CM | POA: Diagnosis not present

## 2016-08-17 DIAGNOSIS — I252 Old myocardial infarction: Secondary | ICD-10-CM | POA: Diagnosis not present

## 2016-08-17 DIAGNOSIS — Z85118 Personal history of other malignant neoplasm of bronchus and lung: Secondary | ICD-10-CM | POA: Diagnosis not present

## 2016-08-17 DIAGNOSIS — E43 Unspecified severe protein-calorie malnutrition: Secondary | ICD-10-CM | POA: Diagnosis present

## 2016-08-17 DIAGNOSIS — I714 Abdominal aortic aneurysm, without rupture: Secondary | ICD-10-CM | POA: Diagnosis not present

## 2016-08-17 DIAGNOSIS — D6481 Anemia due to antineoplastic chemotherapy: Secondary | ICD-10-CM | POA: Diagnosis not present

## 2016-08-17 DIAGNOSIS — D638 Anemia in other chronic diseases classified elsewhere: Secondary | ICD-10-CM | POA: Diagnosis not present

## 2016-08-17 DIAGNOSIS — Z955 Presence of coronary angioplasty implant and graft: Secondary | ICD-10-CM

## 2016-08-17 DIAGNOSIS — Z7952 Long term (current) use of systemic steroids: Secondary | ICD-10-CM | POA: Diagnosis not present

## 2016-08-17 DIAGNOSIS — J189 Pneumonia, unspecified organism: Secondary | ICD-10-CM | POA: Diagnosis not present

## 2016-08-17 DIAGNOSIS — R0602 Shortness of breath: Secondary | ICD-10-CM | POA: Diagnosis not present

## 2016-08-17 DIAGNOSIS — Z7951 Long term (current) use of inhaled steroids: Secondary | ICD-10-CM | POA: Diagnosis not present

## 2016-08-17 DIAGNOSIS — R9431 Abnormal electrocardiogram [ECG] [EKG]: Secondary | ICD-10-CM | POA: Diagnosis not present

## 2016-08-17 DIAGNOSIS — Z978 Presence of other specified devices: Secondary | ICD-10-CM | POA: Diagnosis not present

## 2016-08-17 DIAGNOSIS — I2510 Atherosclerotic heart disease of native coronary artery without angina pectoris: Secondary | ICD-10-CM | POA: Diagnosis present

## 2016-08-17 DIAGNOSIS — R262 Difficulty in walking, not elsewhere classified: Secondary | ICD-10-CM | POA: Diagnosis not present

## 2016-08-17 DIAGNOSIS — R918 Other nonspecific abnormal finding of lung field: Secondary | ICD-10-CM | POA: Diagnosis not present

## 2016-08-17 DIAGNOSIS — I110 Hypertensive heart disease with heart failure: Secondary | ICD-10-CM | POA: Diagnosis not present

## 2016-08-17 DIAGNOSIS — E2740 Unspecified adrenocortical insufficiency: Secondary | ICD-10-CM | POA: Diagnosis not present

## 2016-08-17 DIAGNOSIS — K219 Gastro-esophageal reflux disease without esophagitis: Secondary | ICD-10-CM | POA: Diagnosis present

## 2016-08-17 DIAGNOSIS — I4892 Unspecified atrial flutter: Secondary | ICD-10-CM | POA: Diagnosis not present

## 2016-08-17 DIAGNOSIS — J449 Chronic obstructive pulmonary disease, unspecified: Secondary | ICD-10-CM | POA: Diagnosis not present

## 2016-08-17 DIAGNOSIS — R0600 Dyspnea, unspecified: Secondary | ICD-10-CM | POA: Diagnosis not present

## 2016-08-17 DIAGNOSIS — M47817 Spondylosis without myelopathy or radiculopathy, lumbosacral region: Secondary | ICD-10-CM | POA: Diagnosis not present

## 2016-08-17 DIAGNOSIS — C159 Malignant neoplasm of esophagus, unspecified: Secondary | ICD-10-CM | POA: Diagnosis not present

## 2016-08-17 DIAGNOSIS — I491 Atrial premature depolarization: Secondary | ICD-10-CM | POA: Diagnosis not present

## 2016-08-17 DIAGNOSIS — E785 Hyperlipidemia, unspecified: Secondary | ICD-10-CM | POA: Diagnosis not present

## 2016-08-17 DIAGNOSIS — C7951 Secondary malignant neoplasm of bone: Secondary | ICD-10-CM | POA: Diagnosis not present

## 2016-08-17 DIAGNOSIS — M1991 Primary osteoarthritis, unspecified site: Secondary | ICD-10-CM | POA: Diagnosis not present

## 2016-08-17 DIAGNOSIS — F419 Anxiety disorder, unspecified: Secondary | ICD-10-CM | POA: Diagnosis present

## 2016-08-17 LAB — TROPONIN I

## 2016-08-17 LAB — CBC WITH DIFFERENTIAL/PLATELET
Basophils Absolute: 0.1 10*3/uL (ref 0–0.1)
Basophils Relative: 1 %
EOS PCT: 1 %
Eosinophils Absolute: 0 10*3/uL (ref 0–0.7)
HCT: 28 % — ABNORMAL LOW (ref 40.0–52.0)
Hemoglobin: 9.3 g/dL — ABNORMAL LOW (ref 13.0–18.0)
LYMPHS PCT: 9 %
Lymphs Abs: 0.9 10*3/uL — ABNORMAL LOW (ref 1.0–3.6)
MCH: 28.3 pg (ref 26.0–34.0)
MCHC: 33.3 g/dL (ref 32.0–36.0)
MCV: 84.9 fL (ref 80.0–100.0)
MONO ABS: 0.6 10*3/uL (ref 0.2–1.0)
Monocytes Relative: 6 %
Neutro Abs: 8.5 10*3/uL — ABNORMAL HIGH (ref 1.4–6.5)
Neutrophils Relative %: 83 %
PLATELETS: 419 10*3/uL (ref 150–440)
RBC: 3.3 MIL/uL — ABNORMAL LOW (ref 4.40–5.90)
RDW: 24.1 % — ABNORMAL HIGH (ref 11.5–14.5)
WBC: 10 10*3/uL (ref 3.8–10.6)

## 2016-08-17 LAB — COMPREHENSIVE METABOLIC PANEL
ALT: 13 U/L — AB (ref 17–63)
AST: 22 U/L (ref 15–41)
Albumin: 3.2 g/dL — ABNORMAL LOW (ref 3.5–5.0)
Alkaline Phosphatase: 100 U/L (ref 38–126)
Anion gap: 8 (ref 5–15)
BILIRUBIN TOTAL: 0.7 mg/dL (ref 0.3–1.2)
BUN: 12 mg/dL (ref 6–20)
CHLORIDE: 97 mmol/L — AB (ref 101–111)
CO2: 27 mmol/L (ref 22–32)
CREATININE: 0.79 mg/dL (ref 0.61–1.24)
Calcium: 8.7 mg/dL — ABNORMAL LOW (ref 8.9–10.3)
Glucose, Bld: 138 mg/dL — ABNORMAL HIGH (ref 65–99)
Potassium: 3.5 mmol/L (ref 3.5–5.1)
Sodium: 132 mmol/L — ABNORMAL LOW (ref 135–145)
TOTAL PROTEIN: 6.3 g/dL — AB (ref 6.5–8.1)

## 2016-08-17 MED ORDER — FLUDROCORTISONE ACETATE 0.1 MG PO TABS
0.1000 mg | ORAL_TABLET | Freq: Every day | ORAL | Status: DC
  Administered 2016-08-18 – 2016-08-20 (×3): 0.1 mg via ORAL
  Filled 2016-08-17 (×3): qty 1

## 2016-08-17 MED ORDER — PANTOPRAZOLE SODIUM 40 MG PO TBEC
40.0000 mg | DELAYED_RELEASE_TABLET | Freq: Every day | ORAL | Status: DC
  Administered 2016-08-18 – 2016-08-20 (×3): 40 mg via ORAL
  Filled 2016-08-17 (×3): qty 1

## 2016-08-17 MED ORDER — MAGNESIUM CITRATE PO SOLN
1.0000 | Freq: Once | ORAL | Status: DC | PRN
  Filled 2016-08-17: qty 296

## 2016-08-17 MED ORDER — MIRTAZAPINE 15 MG PO TABS
15.0000 mg | ORAL_TABLET | Freq: Every day | ORAL | Status: DC
  Administered 2016-08-17 – 2016-08-19 (×3): 15 mg via ORAL
  Filled 2016-08-17 (×3): qty 1

## 2016-08-17 MED ORDER — ADULT MULTIVITAMIN W/MINERALS CH
1.0000 | ORAL_TABLET | Freq: Every day | ORAL | Status: DC
  Administered 2016-08-18 – 2016-08-20 (×3): 1 via ORAL
  Filled 2016-08-17 (×3): qty 1

## 2016-08-17 MED ORDER — ALBUTEROL SULFATE (2.5 MG/3ML) 0.083% IN NEBU: 2.5000 mg | INHALATION_SOLUTION | Freq: Four times a day (QID) | RESPIRATORY_TRACT | Status: DC | PRN

## 2016-08-17 MED ORDER — IPRATROPIUM BROMIDE 0.02 % IN SOLN: 0.5000 mg | Freq: Four times a day (QID) | RESPIRATORY_TRACT | Status: DC | PRN

## 2016-08-17 MED ORDER — TRAMADOL HCL 50 MG PO TABS
50.0000 mg | ORAL_TABLET | Freq: Four times a day (QID) | ORAL | Status: DC | PRN
  Administered 2016-08-17 – 2016-08-20 (×8): 50 mg via ORAL
  Filled 2016-08-17 (×8): qty 1

## 2016-08-17 MED ORDER — IPRATROPIUM-ALBUTEROL 0.5-2.5 (3) MG/3ML IN SOLN: 3.0000 mL | RESPIRATORY_TRACT | Status: DC | PRN

## 2016-08-17 MED ORDER — BISACODYL 5 MG PO TBEC: 5.0000 mg | DELAYED_RELEASE_TABLET | Freq: Every day | ORAL | Status: DC | PRN

## 2016-08-17 MED ORDER — OXYCODONE HCL 5 MG PO TABS: 5.0000 mg | ORAL_TABLET | ORAL | Status: DC | PRN

## 2016-08-17 MED ORDER — DIPHENOXYLATE-ATROPINE 2.5-0.025 MG PO TABS: 1.0000 | ORAL_TABLET | Freq: Four times a day (QID) | ORAL | Status: DC | PRN

## 2016-08-17 MED ORDER — LIDOCAINE-EPINEPHRINE 2 %-1:100000 IJ SOLN
40.0000 mL | Freq: Once | INTRAMUSCULAR | Status: DC
Start: 2016-08-17 — End: 2016-08-20
  Filled 2016-08-17: qty 40

## 2016-08-17 MED ORDER — ONDANSETRON HCL 4 MG/2ML IJ SOLN: 4.0000 mg | Freq: Four times a day (QID) | INTRAMUSCULAR | Status: DC | PRN

## 2016-08-17 MED ORDER — TIOTROPIUM BROMIDE MONOHYDRATE 18 MCG IN CAPS
18.0000 ug | ORAL_CAPSULE | Freq: Every day | RESPIRATORY_TRACT | Status: DC
  Administered 2016-08-18 – 2016-08-20 (×3): 18 ug via RESPIRATORY_TRACT
  Filled 2016-08-17: qty 5

## 2016-08-17 MED ORDER — PREDNISONE 5 MG PO TABS
10.0000 mg | ORAL_TABLET | ORAL | Status: DC
  Administered 2016-08-18 – 2016-08-20 (×2): 10 mg via ORAL
  Filled 2016-08-17 (×2): qty 2

## 2016-08-17 MED ORDER — LORATADINE 10 MG PO TABS
10.0000 mg | ORAL_TABLET | Freq: Every day | ORAL | Status: DC
  Administered 2016-08-18 – 2016-08-20 (×3): 10 mg via ORAL
  Filled 2016-08-17 (×3): qty 1

## 2016-08-17 MED ORDER — ALPRAZOLAM 0.25 MG PO TABS
0.2500 mg | ORAL_TABLET | Freq: Every day | ORAL | Status: DC | PRN
  Administered 2016-08-19: 0.25 mg via ORAL
  Administered 2016-08-20: 0.5 mg via ORAL
  Filled 2016-08-17: qty 1
  Filled 2016-08-17: qty 2

## 2016-08-17 MED ORDER — LIDOCAINE-PRILOCAINE 2.5-2.5 % EX CREA
TOPICAL_CREAM | Freq: Once | CUTANEOUS | Status: DC
  Filled 2016-08-17: qty 5

## 2016-08-17 MED ORDER — SODIUM CHLORIDE 0.9 % IV BOLUS (SEPSIS)
1000.0000 mL | Freq: Once | INTRAVENOUS | Status: AC
  Administered 2016-08-17: 1000 mL via INTRAVENOUS

## 2016-08-17 MED ORDER — ONDANSETRON HCL 4 MG PO TABS: 4.0000 mg | ORAL_TABLET | Freq: Four times a day (QID) | ORAL | Status: DC | PRN

## 2016-08-17 MED ORDER — MOMETASONE FURO-FORMOTEROL FUM 200-5 MCG/ACT IN AERO
2.0000 | INHALATION_SPRAY | Freq: Two times a day (BID) | RESPIRATORY_TRACT | Status: DC
  Administered 2016-08-18 – 2016-08-20 (×5): 2 via RESPIRATORY_TRACT
  Filled 2016-08-17: qty 8.8

## 2016-08-17 MED ORDER — POTASSIUM CHLORIDE 20 MEQ PO PACK: 20.0000 meq | PACK | Freq: Every day | ORAL | Status: DC

## 2016-08-17 MED ORDER — SENNOSIDES-DOCUSATE SODIUM 8.6-50 MG PO TABS: 1.0000 | ORAL_TABLET | Freq: Every evening | ORAL | Status: DC | PRN

## 2016-08-17 MED ORDER — LIDOCAINE VISCOUS 2 % MT SOLN
20.0000 mL | OROMUCOSAL | Status: DC | PRN
  Filled 2016-08-17: qty 20

## 2016-08-17 MED ORDER — IOPAMIDOL (ISOVUE-370) INJECTION 76%
75.0000 mL | Freq: Once | INTRAVENOUS | Status: AC | PRN
  Administered 2016-08-17: 75 mL via INTRAVENOUS

## 2016-08-17 MED ORDER — NITROGLYCERIN 0.4 MG SL SUBL: 0.4000 mg | SUBLINGUAL_TABLET | SUBLINGUAL | Status: DC | PRN

## 2016-08-17 MED ORDER — ACETAMINOPHEN 500 MG PO TABS: 1000.0000 mg | ORAL_TABLET | ORAL | Status: DC | PRN

## 2016-08-17 MED ORDER — HEPARIN SODIUM (PORCINE) 5000 UNIT/ML IJ SOLN: 5000.0000 [IU] | Freq: Three times a day (TID) | INTRAMUSCULAR | Status: DC

## 2016-08-17 MED ORDER — CLOPIDOGREL BISULFATE 75 MG PO TABS: 75.0000 mg | ORAL_TABLET | Freq: Every day | ORAL | Status: DC

## 2016-08-17 MED ORDER — PROCHLORPERAZINE MALEATE 5 MG PO TABS
5.0000 mg | ORAL_TABLET | Freq: Four times a day (QID) | ORAL | Status: DC | PRN
  Filled 2016-08-17: qty 1

## 2016-08-17 NOTE — ED Notes (Signed)
Pt returned from xray via stretcher.

## 2016-08-17 NOTE — ED Triage Notes (Addendum)
Pt c/o SHOB that is worse with even minimal exertion. Denies any pain. Has had cough. Finished abx last week for PNA.  Tachypnea present. Skin warm and dry. Pt has cancer that has metastasized. No fevers. Has missed chemo because of weakness.  Did have small pneumothorax on last couple xrays. Not eating or drinking. Has been off balance, chemo doctor is aware of him being off balance.

## 2016-08-17 NOTE — Consult Note (Signed)
Surgical Consultation  08/17/2016  [name] is an 76 y.o. male.   CC: Left pneumothorax  HPI: This patient recently discharged from the hospital with a no pneumothorax. It is present back as early as May 16 on a CT scan. Over the last 3 days she's had more shortness of breath and is been treated for a right-sided pneumonia as well. He has an underlying esophageal carcinoma for which she is undergoing chemotherapy. He is actively coughing during this exam.  I was asked see the patient for worsening shortness of breath and worsening CT findings of left pneumothorax. I'm evaluating the patient for the need for a chest tube. The patient is on Plavix for coronary artery disease as well as stroke history  His daughter states that if "you look at him funny he bruises".  Past Medical History:  Diagnosis Date  . AAA (abdominal aortic aneurysm) (HCC)    4.2 cm IR AAA noted on 01/13/15 PET scan  . Anxiety   . CAD (coronary artery disease)    AMI 1997 w/ BMS x 2 LAD, BMS CFX 2002, DES x 2 LAD 2009, DES CFX & OM 2012   . Carotid artery disease ([place])    RICA CEA 5701, LICA CEA 7793 per Dr. [name],   . COPD (chronic obstructive pulmonary disease) ([place])   . GERD (gastroesophageal reflux disease)    occ  . HTN (hypertension)   . Hydradenitis   . Hyperlipidemia   . Ischemic cardiomyopathy    EF 40-45 percent at cath 2012  . Myocardial infarction ([place])   . Non-small cell lung cancer (NSCLC) ([place]) dx'd 12/2014   SBRT  . Osteoarthritis    pt. denies 01/17/2015  . PAC (premature atrial contraction)   . Peripheral vascular disease ([place])   . Pneumonia   . Prostate cancer ([place]) dx'd approx 2008   xrt throughfoley  . Radiation 02/18/15-02/25/15   right upper lung 54 Gy  . Shortness of breath dyspnea   . Stroke [facility]) 11-24-11   "no feeling in right 5th digit"  . Urinary frequency   . Urinary urgency     Past Surgical History:  Procedure Laterality Date  . CARDIAC CATHETERIZATION    .  CARDIOVASCULAR STRESS TEST  12-2006  . CARDIOVERSION N/A 10/31/2015   Procedure: CARDIOVERSION;  Surgeon: Sueanne Margarita, MD;  Location: [place];  Service: Cardiovascular;  Laterality: N/A;  . CAROTID ENDARTERECTOMY Right 03-2000   CE  . CAROTID ENDARTERECTOMY Left 11-24-11   CE  . COLONOSCOPY  04-2001   polyp  . [place], 2002, 2009, 2012   8 stents total  . [place]   neg  . ENDARTERECTOMY  11/24/2011   Procedure: ENDARTERECTOMY CAROTID;  Surgeon: Rosetta Posner, MD;  Location: [place];  Service: Vascular;  Laterality: Left;  . EYE SURGERY Bilateral Aug. 17, 2015   Cataract  . FUDUCIAL PLACEMENT N/A 12/25/2014   Procedure: PLACEMENT OF FUDUCIAL;  Surgeon: Collene Gobble, MD;  Location: [place];  Service: Thoracic;  Laterality: N/A;  . IR GENERIC HISTORICAL  03/18/2016   IR US GUIDE VASC ACCESS LEFT 03/18/2016 Jacqulynn Cadet, MD [facility]  . IR GENERIC HISTORICAL  03/18/2016   IR FLUORO GUIDE PORT INSERTION LEFT 03/18/2016 Jacqulynn Cadet, MD [facility]  . PROSTATE SURGERY     Radiation Tx  X's 40  . TEE WITHOUT CARDIOVERSION N/A 10/31/2015   Procedure: TRANSESOPHAGEAL ECHOCARDIOGRAM (TEE);  Surgeon: Eber Hong  Radford Pax, MD;  Location: [place];  Service: Cardiovascular;  Laterality: N/A;  . VASCULAR SURGERY    . VIDEO BRONCHOSCOPY WITH ENDOBRONCHIAL NAVIGATION N/A 12/25/2014   Procedure: VIDEO BRONCHOSCOPY WITH ENDOBRONCHIAL NAVIGATION  with Biopsies and Brushings;  Surgeon: Collene Gobble, MD;  Location: [place];  Service: Thoracic;  Laterality: N/A;  . VIDEO BRONCHOSCOPY WITH ENDOBRONCHIAL ULTRASOUND N/A 01/22/2015   Procedure: VIDEO BRONCHOSCOPY WITH ENDOBRONCHIAL ULTRASOUND with Biopsies;  Surgeon: Collene Gobble, MD;  [facility];  Service: Thoracic;  Laterality: N/A;    Family History  Problem Relation Age of Onset  . Stroke Father   . Heart disease Father   . Heart disease Mother        pacemaker  . Other Brother         benign brain tumor  . Cancer Brother        Eye-behind  . Cancer Sister        Breast  . Liver cancer Brother   . Diabetes Maternal Grandmother   . Colon polyps Neg Hx   . Esophageal cancer Neg Hx     Social History:  reports that he has been smoking Cigarettes.  He has a 55.00 pack-year smoking history. He has never used smokeless tobacco. He reports that he drinks alcohol. He reports that he does not use drugs.  Allergies:  Allergies  Allergen Reactions  . Augmentin [Amoxicillin-Pot Clavulanate] Diarrhea, Nausea And Vomiting and Other (See Comments)    Has patient had a PCN reaction causing immediate rash, facial/tongue/throat swelling, SOB or lightheadedness with hypotension: No Has patient had a PCN reaction causing severe rash involving mucus membranes or skin necrosis: No Has patient had a PCN reaction that required hospitalization No Has patient had a PCN reaction occurring within the last 10 years: Yes If all of the above answers are "NO", then may proceed with Cephalosporin use.   . Chantix [Varenicline] Other (See Comments)    Violent nightmares  . Sulfonamide Derivatives Other (See Comments)    Pt do not remember 8-30 md  . Zantac [Ranitidine Hcl] Diarrhea    Medications reviewed.   Review of Systems:   Review of Systems  Constitutional: Positive for malaise/fatigue and weight loss. Negative for chills and fever.  HENT: Negative.   Eyes: Negative.   Respiratory: Positive for cough, sputum production, shortness of breath and wheezing. Negative for hemoptysis.   Cardiovascular: Positive for orthopnea. Negative for chest pain and palpitations.  Gastrointestinal: Negative for heartburn, nausea and vomiting.  Genitourinary: Negative.   Musculoskeletal: Negative.   Skin: Negative.   Neurological: Positive for weakness.  Endo/Heme/Allergies: Negative.   Psychiatric/Behavioral: Negative.      Physical Exam:  BP 128/73   Pulse 84   Temp 98.1 F (36.7 C)  (Oral)   Resp 16   Ht 5' 8"  (1.727 m)   Wt 124 lb (56.2 kg)   SpO2 97%   BMI 18.85 kg/m   Physical Exam  Constitutional: He is oriented to person, place, and time. No distress.  Cachectic-appearing male patient with muscle wasting. He does not appear to be utilizing accessory muscles of respiration nor does he  appear uncomfortable. His heart rate is normal and in my exam his respiratory rate is approximately 18 at the time of my visit. He is audibly rhonchus without the use of a stethoscope while in the room. He coughs incessantly during the exam. There is obvious jugulovenous distention. There is marked ecchymosis over the left arm especially  from the knuckles to the shoulder  HENT:  Head: Normocephalic and atraumatic.  Eyes: Pupils are equal, round, and reactive to light. Right eye exhibits no discharge. Left eye exhibits no discharge. No scleral icterus.  Neck: Normal range of motion. JVD present.  Cardiovascular: Normal rate, regular rhythm and normal heart sounds.   Pulmonary/Chest: Effort normal. No stridor. No respiratory distress. He has no wheezes. He has no rales.  Bilateral rhonchi audible with stethoscope. There are breath sounds on the left comparable to on the right but both with severe rhonchi no obvious wheezes  Abdominal: Soft. He exhibits no distension. There is no tenderness.  Musculoskeletal: Normal range of motion. He exhibits edema.  Lymphadenopathy:    He has no cervical adenopathy.  Neurological: He is alert and oriented to person, place, and time.  Skin: Skin is warm and dry. He is not diaphoretic.  Marked ecchymosis of the left arm from the knuckles to the shoulder with dressings in multiple areas.  Psychiatric: Mood and affect normal.  Vitals reviewed.     Results for orders placed or performed during the hospital encounter of 08/17/16 (from the past 48 hour(s))  CBC with Differential     Status: Abnormal   Collection Time: 08/17/16  5:25 PM  Result  Value Ref Range   WBC 10.0 3.8 - 10.6 K/uL   RBC 3.30 (L) 4.40 - 5.90 MIL/uL   Hemoglobin 9.3 (L) 13.0 - 18.0 g/dL   HCT 28.0 (L) 40.0 - 52.0 %   MCV 84.9 80.0 - 100.0 fL   MCH 28.3 26.0 - 34.0 pg   MCHC 33.3 32.0 - 36.0 g/dL   RDW 24.1 (H) 11.5 - 14.5 %   Platelets 419 150 - 440 K/uL   Neutrophils Relative % 83 %   Neutro Abs 8.5 (H) 1.4 - 6.5 K/uL   Lymphocytes Relative 9 %   Lymphs Abs 0.9 (L) 1.0 - 3.6 K/uL   Monocytes Relative 6 %   Monocytes Absolute 0.6 0.2 - 1.0 K/uL   Eosinophils Relative 1 %   Eosinophils Absolute 0.0 0 - 0.7 K/uL   Basophils Relative 1 %   Basophils Absolute 0.1 0 - 0.1 K/uL  Comprehensive metabolic panel     Status: Abnormal   Collection Time: 08/17/16  5:25 PM  Result Value Ref Range   Sodium 132 (L) 135 - 145 mmol/L   Potassium 3.5 3.5 - 5.1 mmol/L   Chloride 97 (L) 101 - 111 mmol/L   CO2 27 22 - 32 mmol/L   Glucose, Bld 138 (H) 65 - 99 mg/dL   BUN 12 6 - 20 mg/dL   Creatinine, Ser 0.79 0.61 - 1.24 mg/dL   Calcium 8.7 (L) 8.9 - 10.3 mg/dL   Total Protein 6.3 (L) 6.5 - 8.1 g/dL   Albumin 3.2 (L) 3.5 - 5.0 g/dL   AST 22 15 - 41 U/L   ALT 13 (L) 17 - 63 U/L   Alkaline Phosphatase 100 38 - 126 U/L   Total Bilirubin 0.7 0.3 - 1.2 mg/dL   GFR calc non Af Amer >60 >60 mL/min   GFR calc Af Amer >60 >60 mL/min    Comment: (NOTE) The eGFR has been calculated using the CKD EPI equation. This calculation has not been validated in all clinical situations. eGFR's persistently <60 mL/min signify possible Chronic Kidney Disease.    Anion gap 8 5 - 15  Troponin I     Status: None   Collection Time: 08/17/16  5:25 PM  Result Value Ref Range   Troponin I <0.03 <0.03 ng/mL   Dg Chest 2 View  Result Date: 08/17/2016 CLINICAL DATA:  76 year old male with history of known pneumonia, bronchitis, and COPD presenting with shortness of breath x3 days. Patient has history of lung cancer and pneumothorax. EXAM: CHEST  2 VIEW COMPARISON:  Chest radiograph dated  08/07/2016 FINDINGS: Left pectoral Port-A-Cath with tip in stable positioning at cavoatrial junction. There has been interval increase in the size of the left-sided pneumothorax measuring approximately 28 mm from the pleural surface (previously 10 mm). There is a small right pleural effusion with patchy area of density at the right lung base consistent with atelectasis versus infiltrate. This is progressed compared to prior radiograph. There is emphysematous changes of the lungs. Multiple surgical clips with adjacent subpleural postsurgical changes and scarring noted in the right upper lobe. There is stable cardiac size. The aorta is tortuous. There is osteopenia with degenerative changes of the spine. No acute osseous pathology noted. IMPRESSION: 1. Slight interval increase in the size of the left pneumothorax compared to the prior radiograph and CT. 2. Emphysema. 3. Small right pleural effusion and right lung base patchy opacity similar or slightly progressed compared to prior radiograph. 4. Postsurgical changes of right upper lobe. These results were called by telephone at the time of interpretation on 08/17/2016 at 6:48 pm to Dr. Joni Fears, who verbally acknowledged these results. Electronically Signed   By: Anner Crete M.D.   On: 08/17/2016 18:49   Ct Angio Chest Pe W Or Wo Contrast  Result Date: 08/17/2016 CLINICAL DATA:  Chemotherapy ongoing. RIGHT pneumothorax. LEFT pneumothorax. EXAM: CT ANGIOGRAPHY CHEST WITH CONTRAST TECHNIQUE: Multidetector CT imaging of the chest was performed using the standard protocol during bolus administration of intravenous contrast. Multiplanar CT image reconstructions and MIPs were obtained to evaluate the vascular anatomy. CONTRAST:  Study 5 mL Isovue COMPARISON:  CT 08/04/2016, chest radiograph 08/17/2016, CT 04/27/2016 FINDINGS: Cardiovascular: Coronary artery calcification and aortic atherosclerotic calcification. Mediastinum/Nodes: No axillary or supraclavicular  adenopathy. New mediastinal or hilar adenopathy. No pericardial fluid. No central pulmonary embolism. Lungs/Pleura: Moderate LEFT pneumothorax has increased in volume from CT of 08/04/2016. Pneumothorax occupies approximately 30% of lung volume. Nodule within the LEFT upper lobe measures 6 mm (image 47, series 6) unchanged. Nodule in the RIGHT upper lobe measuring 8 mm (image 32, series 6) decreased from 12 mm on comparison CT of 04/27/2016. More inferior nodule measuring 4 mm is unchanged from Jun 01, 2022. Nodular peripheral consolidation in the RIGHT lower lobe is improved. Small RIGHT effusion. Upper Abdomen: Limited view of the liver, kidneys, pancreas are unremarkable. Normal adrenal glands. Large LEFT renal cyst. Musculoskeletal: Expansile lesion in the anterior medial first rib is again demonstrated measuring 2.7 cm. Lesion within the T3 and T4 vertebral bodies are increased from CT in 06-01-22. For example lesion at T3 on the LEFT measures 2.6 cm increased from 1.2 cm (image 15, series 4. Lesion at T4 on the RIGHT measures 2.2 cm increased from 0.8 cm. Initial sclerotic and lesions lytic lesion more inferiorly are similar. Review of the MIP images confirms the above findings. IMPRESSION: 1. Interval increase in volume of LEFT pneumothorax occupying 30% LEFT hemithorax volume. 2. Stable bilateral pulmonary nodules. 3. Interval increase in soft tissue component of lytic lesions at T3 and T4 compared to CT of favor 04/27/2016. 4. Improvement in consolidation in the RIGHT lower lobe. Critical Value/emergent results were called by telephone at the time of interpretation on  08/17/2016 at 8:08 pm to Dr. Carrie Mew , who verbally acknowledged these results. Electronically Signed   By: Suzy Bouchard M.D.   On: 08/17/2016 20:03    Assessment/Plan:  CT scan from today as well as from 5/16, and chest x-rays from today and prior admissions all reviewed. There is clearly an increase in the size of the  pneumothorax. This pneumothorax is been present for at least 2 weeks. Under many circumstances this would warrant a chest tube. However with the patient being very comfortable with a normal respiratory rate at this time and being on Plavix with considerable ecchymosis present already I believe that observation is warranted. I also believe that a large amount of his respiratory difficulty is related to bilateral pulmonary rhonchi and lack of pulmonary toilet. He would benefit from significant pulmonary toilet and observation with the potential for a chest tube being placed should he deteriorate. This is discussed with Dr. Rip Harbour and with prime doc. I will discuss this with Dr. Faith Rogue in the morning and ask for a thoracic surgery consult concerning this patient's long-standing pneumothorax. Patient's daughter was in agreement and questions were answered for them.  Florene Glen, MD, FACS

## 2016-08-17 NOTE — ED Notes (Signed)
Pt was seen previously and dx with pneumonia. Pt c/o of weakness and fatigue. Pt is CA pt, has port L chest. Pt family member states he had a pneumothorax and pneumonia recently, took antibiotics for it. Family member states that pt has not been getting up much or using incentive spirometer much. Pt has congested cough, denies coughing anything up. Pt denies fevers at home. Pt states SOB with movement. Pt is alert and oriented. Wheeled to room after EKG and placed on cardiac monitor. Pt ambulated from wheelchair to stretcher with assistance.

## 2016-08-17 NOTE — ED Notes (Signed)
Pt has L chest port already in place. Asked this RN to access it versus PIV at this time. Port was accessed in Licensed conveyancer by this Therapist, sports.

## 2016-08-17 NOTE — ED Provider Notes (Addendum)
[facility] Emergency Department Provider Note  ____________________________________________  Time seen: Approximately 8:15 PM  I have reviewed the triage vital signs and the nursing notes.   HISTORY  Chief Complaint Shortness of Breath    HPI [name] is a 76 y.o. male who complains of shortness of breath, worse with walking or any activity, worsening over the last 2 weeks. No chest pain. Does have a congested nonproductive cough without fevers or chills. Was previously treated for pneumonia. Shortness of breath is been constant, gradually worsening. Moderate to severe in intensity.     Past Medical History:  Diagnosis Date  . AAA (abdominal aortic aneurysm) (HCC)    4.2 cm IR AAA noted on 01/13/15 PET scan  . Anxiety   . CAD (coronary artery disease)    AMI 1997 w/ BMS x 2 LAD, BMS CFX 2002, DES x 2 LAD 2009, DES CFX & OM 2012   . Carotid artery disease ([place])    RICA CEA 2355, LICA CEA 7322 per Dr. [name],   . COPD (chronic obstructive pulmonary disease) ([place])   . GERD (gastroesophageal reflux disease)    occ  . HTN (hypertension)   . Hydradenitis   . Hyperlipidemia   . Ischemic cardiomyopathy    EF 40-45 percent at cath 2012  . Myocardial infarction ([place])   . Non-small cell lung cancer (NSCLC) ([place]) dx'd 12/2014   SBRT  . Osteoarthritis    pt. denies 01/17/2015  . PAC (premature atrial contraction)   . Peripheral vascular disease ([employer])   . Pneumonia   . Prostate cancer ([place]) dx'd approx 2008   xrt throughfoley  . Radiation 02/18/15-02/25/15   right upper lung 54 Gy  . Shortness of breath dyspnea   . Stroke [facility]) 11-24-11   "no feeling in right 5th digit"  . Urinary frequency   . Urinary urgency      Patient Active Problem List   Diagnosis Date Noted  . Dehydration 08/11/2016  . Low magnesium level 08/11/2016  . Pneumonia 08/07/2016  . COPD exacerbation ([place]) 08/04/2016  . Port catheter in place 04/06/2016  . 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 Dysphagia 02/04/2016  . Left arm weakness 12/19/2015  . Numbness and tingling in left arm 12/19/2015  . Left shoulder pain 12/19/2015  . History of recent fall 12/19/2015  . Paresthesia of arm 12/01/2015  . Depression 12/01/2015  . Malnutrition of moderate degree 11/20/2015  . TIA (transient ischemic attack) 11/19/2015  . Weakness 11/17/2015  . Physical deconditioning 11/10/2015  . Multiple rib fractures 11/10/2015  . Chronic systolic heart failure ([place]) 11/02/2015  . Atrial flutter ([place]) 10/30/2015  . B12 deficiency 07/22/2015  . Anemia 07/15/2015  . Hematuria 07/08/2015  . Unintentional weight loss 07/08/2015  . Hypotension 07/08/2015  . 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 Cough 05/22/2015  . Tobacco user 05/22/2015  . Mediastinal lymphadenopathy 01/22/2015  . 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 S/P bronchoscopy with biopsy   . Colon cancer screening 11/06/2014  . Encounter for Medicare annual wellness exam 11/06/2014  . Routine general medical examination at a health care facility 11/06/2014  . Lung nodule 06/28/2014  . Carotid stenosis 12/20/2013  . Goals of care, counseling/discussion 12/20/2013  . Aftercare following surgery of the circulatory system, [place] 12/19/2012  . Personal history of colonic polyps 08/28/2012  . Hyperglycemia 08/07/2012  . Pre-operative cardiovascular examination 11/03/2011  . Carotid artery disease ([place])  05/20/2011  . Grief reaction 05/07/2011  . Ischemic cardiomyopathy 01/13/2011  . Palpitations 11/10/2010  . Occlusion and stenosis of carotid artery without mention of cerebral infarction 04/02/2010  . Anxiety disorder 12/12/2009  . HYPERTENSION, BENIGN 09/29/2008  . History of prostate cancer 09/19/2008  . Hyperlipidemia 02/10/2007  . AMAUROSIS FUGAX 02/10/2007  . MYOCARDIAL INFARCTION, HX OF 02/10/2007  . Coronary atherosclerosis 02/10/2007  . PERIPHERAL VASCULAR DISEASE  02/10/2007  . HEMORRHOIDS 02/10/2007  . Chronic obstructive pulmonary disease ([place]) 02/10/2007  . OSTEOARTHRITIS 02/10/2007  . Nicotine dependence 02/10/2007  . Pure hypercholesterolemia 01/27/2007  . CORONARY ATHEROSCLEROSIS NATIVE CORONARY ARTERY 01/27/2007     Past Surgical History:  Procedure Laterality Date  . CARDIAC CATHETERIZATION    . CARDIOVASCULAR STRESS TEST  12-2006  . CARDIOVERSION N/A 10/31/2015   Procedure: CARDIOVERSION;  Surgeon: Sueanne Margarita, MD;  Location: [place];  Service: Cardiovascular;  Laterality: N/A;  . CAROTID ENDARTERECTOMY Right 03-2000   CE  . CAROTID ENDARTERECTOMY Left 11-24-11   CE  . COLONOSCOPY  04-2001   polyp  . [place], 2002, 2009, 2012   8 stents total  . [place]   neg  . ENDARTERECTOMY  11/24/2011   Procedure: ENDARTERECTOMY CAROTID;  Surgeon: Rosetta Posner, MD;  Location: [place];  Service: Vascular;  Laterality: Left;  . EYE SURGERY Bilateral Aug. 17, 2015   Cataract  . FUDUCIAL PLACEMENT N/A 12/25/2014   Procedure: PLACEMENT OF FUDUCIAL;  Surgeon: Collene Gobble, MD;  Location: [place];  Service: Thoracic;  Laterality: N/A;  . IR GENERIC HISTORICAL  03/18/2016   IR US GUIDE VASC ACCESS LEFT 03/18/2016 Jacqulynn Cadet, MD [facility]-INTERV RAD  . IR GENERIC HISTORICAL  03/18/2016   IR FLUORO GUIDE PORT INSERTION LEFT 03/18/2016 Jacqulynn Cadet, MD [facility]-INTER[facility]  . PROSTATE SURGERY     Radiation Tx  X's 40  . TEE WITHOUT CARDIOVERSION N/A 10/31/2015   Procedure: TRANSESOPHAGEAL ECHOCARDIOGRAM (TEE);  Surgeon: Sueanne Margarita, MD;  Location: [facility] ENDOSCOPY;  Service: Cardiovascular;  Laterality: N/A;  . VASCULAR SURGERY    . VIDEO BRONCHOSCOPY WITH ENDOBRONCHIAL NAVIGATION N/A 12/25/2014   Procedure: VIDEO BRONCHOSCOPY WITH ENDOBRONCHIAL NAVIGATION  with Biopsies and Brushings;  Surgeon: Collene Gobble, MD;  Location: [place];  Service: Thoracic;  Laterality: N/A;  . VIDEO BRONCHOSCOPY WITH ENDOBRONCHIAL  ULTRASOUND N/A 01/22/2015   Procedure: VIDEO BRONCHOSCOPY WITH ENDOBRONCHIAL ULTRASOUND with Biopsies;  Surgeon: Collene Gobble, MD;  Location: [place];  Service: Thoracic;  Laterality: N/A;     Prior to Admission medications   Medication Sig Start Date End Date Taking? Authorizing Provider  acetaminophen (TYLENOL) 500 MG tablet Take 1,000 mg by mouth every 4 (four) hours as needed for mild pain, moderate pain, fever or headache.  10/17/15   [provider]  ALPRAZolam Duanne Moron) 0.5 MG tablet Take 0.5-1 tablets (0.25-0.5 mg total) by mouth daily as needed for anxiety or sleep. 05/18/16   Owens Shark, NP  clopidogrel (PLAVIX) 75 MG tablet TAKE 1 TABLET BY MOUTH EVERY DAY 06/07/16   Burnell Blanks, MD  diphenoxylate-atropine (LOMOTIL) 2.5-0.025 MG tablet Take 1-2 tablets by mouth 4 (four) times daily as needed for diarrhea or loose stools. 06/15/16   Owens Shark, NP  fludrocortisone (FLORINEF) 0.1 MG tablet Take 1 tablet (0.1 mg total) by mouth daily. 08/05/16   Tower, Wynelle Fanny, MD  fluticasone-salmeterol (ADVAIR HFA) 315-17 MCG/ACT inhaler Inhale 2 puffs into the lungs 2 (  two) times daily.    [provider]  ipratropium-albuterol (DUONEB) 0.5-2.5 (3) MG/3ML SOLN Take 3 mLs by nebulization every 4 (four) hours as needed (shortness of breath and wheezing).    [provider]  levofloxacin (LEVAQUIN) 750 MG tablet Take 1 tablet (750 mg total) by mouth daily. 08/05/16   Bettey Costa, MD  lidocaine (XYLOCAINE) 2 % solution Use as directed 20 mLs in the mouth or throat as needed for mouth pain.    [provider]  lidocaine-prilocaine (EMLA) cream Apply to port site one hour prior to use. Do not rub in. Cover with plastic. 07/07/16   Ladell Pier, MD  loratadine (CLARITIN) 10 MG tablet TAKE 1 TABLET BY MOUTH EVERY DAY 07/22/16   Collene Gobble, MD  mirtazapine (REMERON) 15 MG tablet TAKE 1 TABLET (15 MG TOTAL) BY MOUTH AT BEDTIME. 07/05/16   Tower, Wynelle Fanny, MD   Multiple Vitamin (MULTIVITAMIN WITH MINERALS) TABS tablet Take 1 tablet by mouth daily.    [provider]  nitroGLYCERIN (NITROSTAT) 0.4 MG SL tablet Place 0.4 mg under the tongue every 5 (five) minutes as needed for chest pain.    [provider]  omeprazole (PRILOSEC) 40 MG capsule TAKE 1 CAPSULE 30 MINUTES BEFORE BREAKFAST AND DINNER 08/04/16   Ladene Artist, MD  potassium chloride 20 MEQ/15ML (10%) SOLN Take 15 mLs (20 mEq total) by mouth daily. 08/06/16   Owens Shark, NP  predniSONE (DELTASONE) 20 MG tablet Take 0.5 tablets (10 mg total) by mouth every other day. Please continue after you take 50 mg prednisone for 5 days To stimulate appetite 08/05/16   Bettey Costa, MD  prochlorperazine (COMPAZINE) 5 MG tablet Take 1 tablet (5 mg total) by mouth every 6 (six) hours as needed for nausea or vomiting. 07/27/16   Ladell Pier, MD  tiotropium (SPIRIVA) 18 MCG inhalation capsule Place 1 capsule (18 mcg total) into inhaler and inhale daily. 02/24/16   Parrett, Fonnie Mu, NP  traMADol (ULTRAM) 50 MG tablet Take 1 tablet (50 mg total) by mouth every 6 (six) hours as needed. 07/29/16   Ladell Pier, MD  VENTOLIN HFA 108 (90 Base) MCG/ACT inhaler INHALE 2 PUFFS INTO THE LUNGS EVERY 4 (FOUR) HOURS AS NEEDED FOR WHEEZING OR SHORTNESS OF BREATH. 05/12/16   Tower, Wynelle Fanny, MD     Allergies Augmentin [amoxicillin-pot clavulanate]; Chantix [varenicline]; Sulfonamide derivatives; and Zantac [ranitidine hcl]   Family History  Problem Relation Age of Onset  . Stroke Father   . Heart disease Father   . Heart disease Mother        pacemaker  . Other Brother        benign brain tumor  . Cancer Brother        Eye-behind  . Cancer Sister        Breast  . Liver cancer Brother   . Diabetes Maternal Grandmother   . Colon polyps Neg Hx   . Esophageal cancer Neg Hx     Social History Social History  Substance Use Topics  . Smoking status: Current Every Day Smoker    Packs/day:  1.00    Years: 55.00    Types: Cigarettes  . Smokeless tobacco: Never Used     Comment: 1/2 ppd (01/07/15)  . Alcohol use 0.0 oz/week     Comment: occ    Review of Systems  Constitutional:   No fever or chills.  ENT:   No sore throat. No  rhinorrhea. Cardiovascular:   No chest pain or syncope. Respiratory:   Positive shortness of breath and cough as above. Gastrointestinal:   Negative for abdominal pain, vomiting and diarrhea.  Musculoskeletal:   Negative for focal pain or swelling All other systems reviewed and are negative except as documented above in ROS and HPI.  ____________________________________________   PHYSICAL EXAM:  VITAL SIGNS: ED Triage Vitals [08/17/16 1715]  Enc Vitals Group     BP 97/66     Pulse Rate 100     Resp (!) 24     Temp 98.1 F (36.7 C)     Temp Source Oral     SpO2 100 %     Weight 124 lb (56.2 kg)     Height 5\' 8"  (1.727 m)     Head Circumference      Peak Flow      Pain Score      Pain Loc      Pain Edu?      Excl. in [place]?     Vital signs reviewed, nursing assessments reviewed.   Constitutional:   Alert and oriented. Chronically ill appearing. Not in distress. Eyes:   No scleral icterus. No conjunctival pallor. PERRL. EOMI.  No nystagmus. ENT   Head:   Normocephalic and atraumatic.   Nose:   No congestion/rhinnorhea.    Mouth/Throat:   MMM, no pharyngeal erythema. No peritonsillar mass.    Neck:   No meningismus. Full ROM Hematological/Lymphatic/Immunilogical:   No cervical lymphadenopathy. Cardiovascular:   RRR. Symmetric bilateral radial and DP pulses.  No murmurs.  Respiratory:   Normal respiratory effort without tachypnea/retractions. Diminished left-sided breath sounds .  Port-A-Cath present in left upper chest wall Gastrointestinal:   Soft and nontender. Non distended. There is no CVA tenderness.  No rebound, rigidity, or guarding. Genitourinary:   deferred Musculoskeletal:   Normal range of motion in all  extremities. No joint effusions.  No lower extremity tenderness.  No edema. Neurologic:   Normal speech and language.   Motor grossly intact. No gross focal neurologic deficits are appreciated.  Skin:    Skin is warm, dry and intact. No rash noted.  No petechiae, purpura, or bullae.  ____________________________________________    LABS (pertinent positives/negatives) (all labs ordered are listed, but only abnormal results are displayed) Labs Reviewed  CBC WITH DIFFERENTIAL/PLATELET - Abnormal; Notable for the following:       Result Value   RBC 3.30 (*)    Hemoglobin 9.3 (*)    HCT 28.0 (*)    RDW 24.1 (*)    Neutro Abs 8.5 (*)    Lymphs Abs 0.9 (*)    All other components within normal limits  COMPREHENSIVE METABOLIC PANEL - Abnormal; Notable for the following:    Sodium 132 (*)    Chloride 97 (*)    Glucose, Bld 138 (*)    Calcium 8.7 (*)    Total Protein 6.3 (*)    Albumin 3.2 (*)    ALT 13 (*)    All other components within normal limits  TROPONIN I   ____________________________________________   EKG  Interpreted by me Sinus rhythm rate of 99, normal axis and intervals. Normal QRS. Normal ST segments. T wave inversions in inferior and lateral leads, new since 08/06/2016.  ____________________________________________    RADIOLOGY  Dg Chest 2 View  Result Date: 08/17/2016 CLINICAL DATA:  76 year old male with history of known pneumonia, bronchitis, and COPD presenting with shortness of breath x3 days. Patient  has history of lung cancer and pneumothorax. EXAM: CHEST  2 VIEW COMPARISON:  Chest radiograph dated 08/07/2016 FINDINGS: Left pectoral Port-A-Cath with tip in stable positioning at cavoatrial junction. There has been interval increase in the size of the left-sided pneumothorax measuring approximately 28 mm from the pleural surface (previously 10 mm). There is a small right pleural effusion with patchy area of density at the right lung base consistent with  atelectasis versus infiltrate. This is progressed compared to prior radiograph. There is emphysematous changes of the lungs. Multiple surgical clips with adjacent subpleural postsurgical changes and scarring noted in the right upper lobe. There is stable cardiac size. The aorta is tortuous. There is osteopenia with degenerative changes of the spine. No acute osseous pathology noted. IMPRESSION: 1. Slight interval increase in the size of the left pneumothorax compared to the prior radiograph and CT. 2. Emphysema. 3. Small right pleural effusion and right lung base patchy opacity similar or slightly progressed compared to prior radiograph. 4. Postsurgical changes of right upper lobe. These results were called by telephone at the time of interpretation on 08/17/2016 at 6:48 pm to Dr. Joni Fears, who verbally acknowledged these results. Electronically Signed   By: Anner Crete M.D.   On: 08/17/2016 18:49   Ct Angio Chest Pe W Or Wo Contrast  Result Date: 08/17/2016 CLINICAL DATA:  Chemotherapy ongoing. RIGHT pneumothorax. LEFT pneumothorax. EXAM: CT ANGIOGRAPHY CHEST WITH CONTRAST TECHNIQUE: Multidetector CT imaging of the chest was performed using the standard protocol during bolus administration of intravenous contrast. Multiplanar CT image reconstructions and MIPs were obtained to evaluate the vascular anatomy. CONTRAST:  Study 5 mL Isovue COMPARISON:  CT 08/04/2016, chest radiograph 08/17/2016, CT 04/27/2016 FINDINGS: Cardiovascular: Coronary artery calcification and aortic atherosclerotic calcification. Mediastinum/Nodes: No axillary or supraclavicular adenopathy. New mediastinal or hilar adenopathy. No pericardial fluid. No central pulmonary embolism. Lungs/Pleura: Moderate LEFT pneumothorax has increased in volume from CT of 08/04/2016. Pneumothorax occupies approximately 30% of lung volume. Nodule within the LEFT upper lobe measures 6 mm (image 47, series 6) unchanged. Nodule in the RIGHT upper lobe  measuring 8 mm (image 32, series 6) decreased from 12 mm on comparison CT of 04/27/2016. More inferior nodule measuring 4 mm is unchanged from 05/25/2022. Nodular peripheral consolidation in the RIGHT lower lobe is improved. Small RIGHT effusion. Upper Abdomen: Limited view of the liver, kidneys, pancreas are unremarkable. Normal adrenal glands. Large LEFT renal cyst. Musculoskeletal: Expansile lesion in the anterior medial first rib is again demonstrated measuring 2.7 cm. Lesion within the T3 and T4 vertebral bodies are increased from CT in 05/25/22. For example lesion at T3 on the LEFT measures 2.6 cm increased from 1.2 cm (image 15, series 4. Lesion at T4 on the RIGHT measures 2.2 cm increased from 0.8 cm. Initial sclerotic and lesions lytic lesion more inferiorly are similar. Review of the MIP images confirms the above findings. IMPRESSION: 1. Interval increase in volume of LEFT pneumothorax occupying 30% LEFT hemithorax volume. 2. Stable bilateral pulmonary nodules. 3. Interval increase in soft tissue component of lytic lesions at T3 and T4 compared to CT of favor 04/27/2016. 4. Improvement in consolidation in the RIGHT lower lobe. Critical Value/emergent results were called by telephone at the time of interpretation on 08/17/2016 at 8:08 pm to Dr. Carrie Mew , who verbally acknowledged these results. Electronically Signed   By: Suzy Bouchard M.D.   On: 08/17/2016 20:03    ____________________________________________   PROCEDURES Procedures  ____________________________________________   INITIAL IMPRESSION / ASSESSMENT AND  PLAN / ED COURSE  Pertinent labs & imaging results that were available during my care of the patient were reviewed by me and considered in my medical decision making (see chart for details).  Patient presents with dyspnea on exertion. Chest x-ray initially shows enlargement of previously identified pneumothorax. Labs otherwise unremarkable. With the diffuse T-wave  inversions and his medical history, was necessary to evaluate once again for pulmonary embolism, so a CT angiogram was performed. This shows no PE, improvement in previously identified pneumonia, and shows approximately 30% volume loss pneumothorax on the left side. This was discussed with radiology.  I discussed with Dr. Burt Knack of Gen. surgery at 8:15 PM to evaluate for chest tube placement and admission.     ____________________________________________   FINAL CLINICAL IMPRESSION(S) / ED DIAGNOSES  Final diagnoses:  Dyspnea  Dyspnea, unspecified type  Pneumothorax, unspecified type      New Prescriptions   No medications on file     Portions of this note were generated with dragon dictation software. Dictation errors may occur despite best attempts at proofreading.    Carrie Mew, MD 08/17/16 2019    Carrie Mew, MD 08/17/16 2025

## 2016-08-17 NOTE — ED Notes (Signed)
Pt family member asking for pt to be placed in rehab after hospital stay. She is worried about pt staying at home by himself. She called primary MD and PT today and they both informed her that the pt needs to be in a rehab facility.

## 2016-08-17 NOTE — Progress Notes (Signed)
Patient visited and seen on 2c floor upon arrival from emergency room.  Patient states he feels slightly better does not have any chest pain and no shortness of breath at this time. He states he did not have a breathing treatment in the emergency room. He arrives on room air. No supplemental oxygen.  Room air saturations are measured at 99% in my presence. His respiratory rate is 13 In general he appears well with no respiratory distress breathing normally Chest demonstrates improved lung sounds bilaterally with bilateral breath sounds and less rhonchi than previously noted. Port is noted in the left chest as well.  Some improvement with patient's pulmonary status. He shows no sign of acute respiratory distress at this time. Continue monitoring. I have left a message for Dr. Genevive Bi to see the patient in the morning as well. Discussed with the nursing staff

## 2016-08-17 NOTE — H&P (Signed)
History and Physical   [facility] - [place] @ [facility] Admission History and Physical [employer], D.O.    Patient Name: [name] MR#: [id number] Date of Birth: 1941-03-11 Date of Admission: 08/17/2016  Referring MD/NP/PA: Dr. Joni Fears Primary Care Physician: Tower, Wynelle Fanny, MD Patient coming from: Home   Chief Complaint:  Chief Complaint  Patient presents with  . Shortness of Breath    HPI: [name] is a 76 y.o. male with a known history of Coronary artery disease, COPD, ischemic cardiomyopathy, esophageal cancer undergoing chemotherapy presents to the emergency department for evaluation of shortness of breath.  Patient was in a usual state of health until 2 weeks ago when he describes progressively worsening shortness of breath, dyspnea on exertion associated with a nonproductive cough.. His symptoms have worsened over the last 2-3 days. Of note patient was discharged from the hospital with a pneumothorax that presented on a CT scan of 08/04/2016.  Patient denies fevers/chills, weakness, dizziness, chest pain, , N/V/C/D, abdominal pain, dysuria/frequency, changes in mental status.    Otherwise there has been no change in status. Patient has been taking medication as prescribed and there has been no recent change in medication or diet.  No recent antibiotics.  There has been no recent illness, hospitalizations, travel or sick contacts.    EMS/ED Course: Patient was seen in the emergency department by surgery who deferred chest tube insertion at this time and indicated that the patient would benefit from chest physiotherapy given the stability of the pneumothorax and patient's stable vital signs.   Review of Systems:  CONSTITUTIONAL: No fever/chills, fatigue, weakness, weight gain/loss, headache. EYES: No blurry or double vision. ENT: No tinnitus, postnasal drip, redness or soreness of the oropharynx. RESPIRATORY:Positive  cough, dyspnea, wheeze.  No hemoptysis.   CARDIOVASCULAR: No chest pain, palpitations, syncope, orthopnea. No lower extremity edema.  GASTROINTESTINAL: No nausea, vomiting, abdominal pain, diarrhea, constipation.  No hematemesis, melena or hematochezia. GENITOURINARY: No dysuria, frequency, hematuria. ENDOCRINE: No polyuria or nocturia. No heat or cold intolerance. HEMATOLOGY: No anemia, bruising, bleeding. INTEGUMENTARY: No rashes, ulcers, lesions. MUSCULOSKELETAL: No arthritis, gout, dyspnea. NEUROLOGIC: No numbness, tingling, ataxia, seizure-type activity, weakness. PSYCHIATRIC: No anxiety, depression, insomnia.   Past Medical History:  Diagnosis Date  . AAA (abdominal aortic aneurysm) (HCC)    4.2 cm IR AAA noted on 01/13/15 PET scan  . Anxiety   . CAD (coronary artery disease)    AMI 1997 w/ BMS x 2 LAD, BMS CFX 2002, DES x 2 LAD 2009, DES CFX & OM 2012   . Carotid artery disease ([place])    RICA CEA 1779, LICA CEA 3903 per Dr. [name],   . COPD (chronic obstructive pulmonary disease) ([place])   . GERD (gastroesophageal reflux disease)    occ  . HTN (hypertension)   . Hydradenitis   . Hyperlipidemia   . Ischemic cardiomyopathy    EF 40-45 percent at cath 2012  . Myocardial infarction ([place])   . Non-small cell lung cancer (NSCLC) ([place]) dx'd 12/2014   SBRT  . Osteoarthritis    pt. denies 01/17/2015  . PAC (premature atrial contraction)   . Peripheral vascular disease ([place])   . Pneumonia   . Prostate cancer ([place]) dx'd approx 2008   xrt throughfoley  . Radiation 02/18/15-02/25/15   right upper lung 54 Gy  . Shortness of breath dyspnea   . Stroke [facility]) 11-24-11   "no feeling in right 5th digit"  . Urinary frequency   . Urinary urgency  Past Surgical History:  Procedure Laterality Date  . CARDIAC CATHETERIZATION    . CARDIOVASCULAR STRESS TEST  12-2006  . CARDIOVERSION N/A 10/31/2015   Procedure: CARDIOVERSION;  Surgeon: Sueanne Margarita, MD;  Location: [place];  Service: Cardiovascular;  Laterality: N/A;  .  CAROTID ENDARTERECTOMY Right 03-2000   CE  . CAROTID ENDARTERECTOMY Left 11-24-11   CE  . COLONOSCOPY  04-2001   polyp  . [place], 2002, 2009, 2012   8 stents total  . [place]   neg  . ENDARTERECTOMY  11/24/2011   Procedure: ENDARTERECTOMY CAROTID;  Surgeon: Rosetta Posner, MD;  Location: [place];  Service: Vascular;  Laterality: Left;  . EYE SURGERY Bilateral Aug. 17, 2015   Cataract  . FUDUCIAL PLACEMENT N/A 12/25/2014   Procedure: PLACEMENT OF FUDUCIAL;  Surgeon: Collene Gobble, MD;  Location: [place];  Service: Thoracic;  Laterality: N/A;  . IR GENERIC HISTORICAL  03/18/2016   IR US GUIDE VASC ACCESS LEFT 03/18/2016 Ian Cadet, MD [facility]-INTER[facility] RAD  . IR GENERIC HISTORICAL  03/18/2016   IR FLUORO GUIDE PORT INSERTION LEFT 03/18/2016 Ian Cadet, MD [facility]INTER[facility]  . PROSTATE SURGERY     Radiation Tx  X's 40  . TEE WITHOUT CARDIOVERSION N/A 10/31/2015   Procedure: TRANSESOPHAGEAL ECHOCARDIOGRAM (TEE);  Surgeon: Sueanne Margarita, MD;  Location: [facility];  Service: Cardiovascular;  Laterality: N/A;  . VASCULAR SURGERY    . VIDEO BRONCHOSCOPY WITH ENDOBRONCHIAL NAVIGATION N/A 12/25/2014   Procedure: VIDEO BRONCHOSCOPY WITH ENDOBRONCHIAL NAVIGATION  with Biopsies and Brushings;  Surgeon: Collene Gobble, MD;  Location: [place];  Service: Thoracic;  Laterality: N/A;  . VIDEO BRONCHOSCOPY WITH ENDOBRONCHIAL ULTRASOUND N/A 01/22/2015   Procedure: VIDEO BRONCHOSCOPY WITH ENDOBRONCHIAL ULTRASOUND with Biopsies;  Surgeon: Collene Gobble, MD;  Location: [place];  Service: Thoracic;  Laterality: N/A;     reports that he has been smoking Cigarettes.  He has a 55.00 pack-year smoking history. He has never used smokeless tobacco. He reports that he drinks alcohol. He reports that he does not use drugs.  Allergies  Allergen Reactions  . Augmentin [Amoxicillin-Pot Clavulanate] Diarrhea, Nausea And Vomiting and Other (See Comments)    Has patient had a PCN  reaction causing immediate rash, facial/tongue/throat swelling, SOB or lightheadedness with hypotension: No Has patient had a PCN reaction causing severe rash involving mucus membranes or skin necrosis: No Has patient had a PCN reaction that required hospitalization No Has patient had a PCN reaction occurring within the last 10 years: Yes If all of the above answers are "NO", then may proceed with Cephalosporin use.   . Chantix [Varenicline] Other (See Comments)    Violent nightmares  . Sulfonamide Derivatives Other (See Comments)    Pt do not remember 8-30 md  . Zantac [Ranitidine Hcl] Diarrhea    Family History  Problem Relation Age of Onset  . Stroke Father   . Heart disease Father   . Heart disease Mother        pacemaker  . Other Brother        benign brain tumor  . Cancer Brother        Eye-behind  . Cancer Sister        Breast  . Liver cancer Brother   . Diabetes Maternal Grandmother   . Colon polyps Neg Hx   . Esophageal cancer Neg Hx     Prior to Admission medications   Medication Sig  Start Date End Date Taking? Authorizing Provider  acetaminophen (TYLENOL) 500 MG tablet Take 1,000 mg by mouth every 4 (four) hours as needed for mild pain, moderate pain, fever or headache.  10/17/15  Yes [provider]  ALPRAZolam (XANAX) 0.5 MG tablet Take 0.5-1 tablets (0.25-0.5 mg total) by mouth daily as needed for anxiety or sleep. 05/18/16  Yes Owens Shark, NP  clopidogrel (PLAVIX) 75 MG tablet TAKE 1 TABLET BY MOUTH EVERY DAY 06/07/16  Yes Burnell Blanks, MD  diphenoxylate-atropine (LOMOTIL) 2.5-0.025 MG tablet Take 1-2 tablets by mouth 4 (four) times daily as needed for diarrhea or loose stools. 06/15/16  Yes Owens Shark, NP  fludrocortisone (FLORINEF) 0.1 MG tablet Take 1 tablet (0.1 mg total) by mouth daily. 08/05/16  Yes Tower, Wynelle Fanny, MD  fluticasone-salmeterol (ADVAIR HFA) 161-09 MCG/ACT inhaler Inhale 2 puffs into the lungs 2 (two) times daily.   Yes  [provider]  ipratropium-albuterol (DUONEB) 0.5-2.5 (3) MG/3ML SOLN Take 3 mLs by nebulization every 4 (four) hours as needed (shortness of breath and wheezing).   Yes [provider]  lidocaine (XYLOCAINE) 2 % solution Use as directed 20 mLs in the mouth or throat as needed for mouth pain.   Yes [provider]  lidocaine-prilocaine (EMLA) cream Apply to port site one hour prior to use. Do not rub in. Cover with plastic. 07/07/16  Yes Ladell Pier, MD  loratadine (CLARITIN) 10 MG tablet TAKE 1 TABLET BY MOUTH EVERY DAY 07/22/16  Yes Collene Gobble, MD  mirtazapine (REMERON) 15 MG tablet TAKE 1 TABLET (15 MG TOTAL) BY MOUTH AT BEDTIME. 07/05/16  Yes Tower, Wynelle Fanny, MD  Multiple Vitamin (MULTIVITAMIN WITH MINERALS) TABS tablet Take 1 tablet by mouth daily.   Yes [provider]  nitroGLYCERIN (NITROSTAT) 0.4 MG SL tablet Place 0.4 mg under the tongue every 5 (five) minutes as needed for chest pain.   Yes [provider]  omeprazole (PRILOSEC) 40 MG capsule TAKE 1 CAPSULE 30 MINUTES BEFORE BREAKFAST AND DINNER 08/04/16  Yes Ladene Artist, MD  potassium chloride 20 MEQ/15ML (10%) SOLN Take 15 mLs (20 mEq total) by mouth daily. 08/06/16  Yes Owens Shark, NP  prochlorperazine (COMPAZINE) 5 MG tablet Take 1 tablet (5 mg total) by mouth every 6 (six) hours as needed for nausea or vomiting. 07/27/16  Yes Ladell Pier, MD  tiotropium (SPIRIVA) 18 MCG inhalation capsule Place 1 capsule (18 mcg total) into inhaler and inhale daily. 02/24/16  Yes Parrett, Tammy S, NP  traMADol (ULTRAM) 50 MG tablet Take 1 tablet (50 mg total) by mouth every 6 (six) hours as needed. 07/29/16  Yes Ladell Pier, MD  VENTOLIN HFA 108 (90 Base) MCG/ACT inhaler INHALE 2 PUFFS INTO THE LUNGS EVERY 4 (FOUR) HOURS AS NEEDED FOR WHEEZING OR SHORTNESS OF BREATH. 05/12/16  Yes Tower, Wynelle Fanny, MD  predniSONE (DELTASONE) 20 MG tablet Take 0.5 tablets (10 mg total) by mouth every other  day. Please continue after you take 50 mg prednisone for 5 days To stimulate appetite Patient not taking: Reported on 08/17/2016 08/05/16   Bettey Costa, MD    Physical Exam: Vitals:   08/17/16 2022 08/17/16 2100 08/17/16 2130 08/17/16 2200  BP: 135/86 128/73 (!) 145/77 120/76  Pulse: 82 84 83 83  Resp: 17 16 14 13   Temp:      TempSrc:      SpO2: 97% 97% 96% 95%  Weight:  Height:        GENERAL: 76 y.o.-year-old Frail elderly white male  patient, cachectic n the bed in no acute distress. Resting comfortably  HEENT: Head atraumatic, normocephalic. Pupils equal, round, reactive to light and accommodation. No scleral icterus. Extraocular muscles intact. Nares are patent. Oropharynx is clear. Mucus membranes moist. NECK: Supple, full range of motion. Positive  JVD, no bruit heard. No thyroid enlargement, no tenderness, no cervical lymphadenopathy. CHEST:  diffuse rhonchi bilaterally. Speaking in full sentences No use of accessory muscles of respiration.  No reproducible chest wall tenderness.  CARDIOVASCULAR: S1, S2 normal. No murmurs, rubs, or gallops. Cap refill <2 seconds. Pulses intact distally.  ABDOMEN: Soft, nondistended, nontender. No rebound, guarding, rigidity. Normoactive bowel sounds present in all four quadrants. No organomegaly or mass. EXTREMITIES:Bilateral lower extremity  pedal edema,  negative cyanosis, or clubbing. No calf tenderness or Homan's sign.  NEUROLOGIC: The patient is alert and oriented x 3. Cranial nerves II through XII are grossly intact with no focal sensorimotor deficit. Muscle strength 5/5 in all extremities. Sensation intact. Gait not checked. PSYCHIATRIC:  Normal affect, mood, thought content. SKIN: Warm, dry, and intact without obvious rash, lesion, or ulcer with the exception of multiple areas of ecchymosis.    Labs on Admission:  CBC:  Recent Labs Lab 08/17/16 1725  WBC 10.0  NEUTROABS 8.5*  HGB 9.3*  HCT 28.0*  MCV 84.9  PLT 182   Basic  Metabolic Panel:  Recent Labs Lab 08/11/16 1911 08/17/16 1725  NA 134* 132*  K 3.4* 3.5  CL 98 97*  CO2 29 27  GLUCOSE 110* 138*  BUN 16 12  CREATININE 1.06 0.79  CALCIUM 8.6 8.7*  MG 1.8  --    GFR: Estimated Creatinine Clearance: 62.4 mL/min (by C-G formula based on SCr of 0.79 mg/dL). Liver Function Tests:  Recent Labs Lab 08/11/16 1911 08/17/16 1725  AST 14 22  ALT 10 13*  ALKPHOS 79 100  BILITOT 0.2 0.7  PROT 5.8* 6.3*  ALBUMIN 3.2* 3.2*   No results for input(s): LIPASE, AMYLASE in the last 168 hours. No results for input(s): AMMONIA in the last 168 hours. Coagulation Profile: No results for input(s): INR, PROTIME in the last 168 hours. Cardiac Enzymes:  Recent Labs Lab 08/17/16 1725  TROPONINI <0.03   BNP (last 3 results) No results for input(s): PROBNP in the last 8760 hours. HbA1C: No results for input(s): HGBA1C in the last 72 hours. CBG: No results for input(s): GLUCAP in the last 168 hours. Lipid Profile: No results for input(s): CHOL, HDL, LDLCALC, TRIG, CHOLHDL, LDLDIRECT in the last 72 hours. Thyroid Function Tests: No results for input(s): TSH, T4TOTAL, FREET4, T3FREE, THYROIDAB in the last 72 hours. Anemia Panel: No results for input(s): VITAMINB12, FOLATE, FERRITIN, TIBC, IRON, RETICCTPCT in the last 72 hours. Urine analysis:    Component Value Date/Time   COLORURINE YELLOW (A) 08/07/2016 1652   APPEARANCEUR HAZY (A) 08/07/2016 1652   LABSPEC 1.017 08/07/2016 1652   LABSPEC 1.010 01/24/2009 1143   PHURINE 5.0 08/07/2016 1652   GLUCOSEU NEGATIVE 08/07/2016 1652   HGBUR NEGATIVE 08/07/2016 [place] 08/07/2016 1652   BILIRUBINUR neg 07/07/2015 1527   BILIRUBINUR Negative 01/24/2009 [place] 08/07/2016 1652   PROTEINUR NEGATIVE 08/07/2016 1652   UROBILINOGEN 1.0 07/07/2015 1527   UROBILINOGEN 0.2 11/17/2011 0822   NITRITE NEGATIVE 08/07/2016 1652   LEUKOCYTESUR NEGATIVE 08/07/2016 1652    LEUKOCYTESUR Small 01/24/2009 1143   Sepsis  Labs: @LABRCNTIP (procalcitonin:4,lacticidven:4) )No results found for this or any previous visit (from the past 240 hour(s)).   Radiological Exams on Admission: Dg Chest 2 View  Result Date: 08/17/2016 CLINICAL DATA:  76 year old male with history of known pneumonia, bronchitis, and COPD presenting with shortness of breath x3 days. Patient has history of lung cancer and pneumothorax. EXAM: CHEST  2 VIEW COMPARISON:  Chest radiograph dated 08/07/2016 FINDINGS: Left pectoral Port-A-Cath with tip in stable positioning at cavoatrial junction. There has been interval increase in the size of the left-sided pneumothorax measuring approximately 28 mm from the pleural surface (previously 10 mm). There is a small right pleural effusion with patchy area of density at the right lung base consistent with atelectasis versus infiltrate. This is progressed compared to prior radiograph. There is emphysematous changes of the lungs. Multiple surgical clips with adjacent subpleural postsurgical changes and scarring noted in the right upper lobe. There is stable cardiac size. The aorta is tortuous. There is osteopenia with degenerative changes of the spine. No acute osseous pathology noted. IMPRESSION: 1. Slight interval increase in the size of the left pneumothorax compared to the prior radiograph and CT. 2. Emphysema. 3. Small right pleural effusion and right lung base patchy opacity similar or slightly progressed compared to prior radiograph. 4. Postsurgical changes of right upper lobe. These results were called by telephone at the time of interpretation on 08/17/2016 at 6:48 pm to Dr. Joni Fears, who verbally acknowledged these results. Electronically Signed   By: Anner Crete M.D.   On: 08/17/2016 18:49   Ct Angio Chest Pe W Or Wo Contrast  Result Date: 08/17/2016 CLINICAL DATA:  Chemotherapy ongoing. RIGHT pneumothorax. LEFT pneumothorax. EXAM: CT ANGIOGRAPHY CHEST WITH  CONTRAST TECHNIQUE: Multidetector CT imaging of the chest was performed using the standard protocol during bolus administration of intravenous contrast. Multiplanar CT image reconstructions and MIPs were obtained to evaluate the vascular anatomy. CONTRAST:  Study 5 mL Isovue COMPARISON:  CT 08/04/2016, chest radiograph 08/17/2016, CT 04/27/2016 FINDINGS: Cardiovascular: Coronary artery calcification and aortic atherosclerotic calcification. Mediastinum/Nodes: No axillary or supraclavicular adenopathy. New mediastinal or hilar adenopathy. No pericardial fluid. No central pulmonary embolism. Lungs/Pleura: Moderate LEFT pneumothorax has increased in volume from CT of 08/04/2016. Pneumothorax occupies approximately 30% of lung volume. Nodule within the LEFT upper lobe measures 6 mm (image 47, series 6) unchanged. Nodule in the RIGHT upper lobe measuring 8 mm (image 32, series 6) decreased from 12 mm on comparison CT of 04/27/2016. More inferior nodule measuring 4 mm is unchanged from 2022/05/30. Nodular peripheral consolidation in the RIGHT lower lobe is improved. Small RIGHT effusion. Upper Abdomen: Limited view of the liver, kidneys, pancreas are unremarkable. Normal adrenal glands. Large LEFT renal cyst. Musculoskeletal: Expansile lesion in the anterior medial first rib is again demonstrated measuring 2.7 cm. Lesion within the T3 and T4 vertebral bodies are increased from CT in 30-May-2022. For example lesion at T3 on the LEFT measures 2.6 cm increased from 1.2 cm (image 15, series 4. Lesion at T4 on the RIGHT measures 2.2 cm increased from 0.8 cm. Initial sclerotic and lesions lytic lesion more inferiorly are similar. Review of the MIP images confirms the above findings. IMPRESSION: 1. Interval increase in volume of LEFT pneumothorax occupying 30% LEFT hemithorax volume. 2. Stable bilateral pulmonary nodules. 3. Interval increase in soft tissue component of lytic lesions at T3 and T4 compared to CT of favor 04/27/2016.  4. Improvement in consolidation in the RIGHT lower lobe. Critical Value/emergent results were called by telephone  at the time of interpretation on 08/17/2016 at 8:08 pm to Dr. Carrie Mew , who verbally acknowledged these results. Electronically Signed   By: Suzy Bouchard M.D.   On: 08/17/2016 20:03    EKG: Normal sinus rhythm at 99  bpm with normal axis and nonspecific ST-T wave changes. New inferior and lateral T-wave inversions  Assessment/Plan  This is a 76 y.o. male with a history of Coronary artery disease, COPD, ischemic cardiomyopathy, esophageal cancer undergoing chemotherapy  now being admitted with:  #. Dyspnea secondary to pneumothorax -Admit for observation, continuous pulse oximetry -O2 therapy and med nebs as needed -Chest physiotherapy per RT, expectorants -Gen. surgery and cardiothoracic surgery input appreciated  -Hold Plavix tonight's for possible chest tube in a.m. Resume as soon as possible  #. New T-wave inversions on EKG -Repeat EKG in a.m. -Trend troponins  #. Hyponatremia, mild and chronic -Monitor BMP  #. Anemia, chronic and stable -Monitor CBC   #. History of coronary artery disease -Hold Plavix for possible procedure, resume as soon as possible -Continue nitroglycerin  #. History of COPD - Continue Spiriva, med nebs, do F her Advair  #. History of GERD - Continue Protonix   Admission status: Observation  IV Fluids: Hep-Lock  Diet/Nutrition: Heart healthy  Consults called: General and cardiothoracic surgery  DVT Px:  heparin SCDs and early ambulation. Code Status: Full Code  Disposition Plan: To home in 1-2  days  All the records are reviewed and case discussed with ED provider. Management plans discussed with the patient and/or family who express understanding and agree with plan of care.  Jermani Eberlein D.O. on 08/17/2016 at 10:12 PM Between 7am to 6pm - Pager - [phone] After 6pm go to [email or web address] - Tour manager [place] [facility] Office [phone] CC: Primary care physician; Tower, Wynelle Fanny, MD   08/17/2016, 10:12 PM

## 2016-08-17 NOTE — ED Notes (Signed)
Pt states esophageal CA and is receiving chemo

## 2016-08-17 NOTE — ED Notes (Addendum)
Pt called out stating he needed to urinate. Urinal at bedside. Pt states he couldn't hold it and urinated in bed. Wet underwear taken off and placed in pt belongings bag. Linen changed and new gown placed on pt. Brief on pt and chuck underneath him. Cleaned skin with wet wipe. Pt given new warm blankets.    Dr. Burt Knack at bedside.

## 2016-08-18 ENCOUNTER — Other Ambulatory Visit: Payer: Self-pay | Admitting: Internal Medicine

## 2016-08-18 ENCOUNTER — Inpatient Hospital Stay: Payer: Medicare Other

## 2016-08-18 ENCOUNTER — Observation Stay: Payer: Medicare Other

## 2016-08-18 DIAGNOSIS — J939 Pneumothorax, unspecified: Secondary | ICD-10-CM | POA: Diagnosis not present

## 2016-08-18 DIAGNOSIS — E871 Hypo-osmolality and hyponatremia: Secondary | ICD-10-CM | POA: Diagnosis present

## 2016-08-18 DIAGNOSIS — Z978 Presence of other specified devices: Secondary | ICD-10-CM

## 2016-08-18 DIAGNOSIS — I10 Essential (primary) hypertension: Secondary | ICD-10-CM

## 2016-08-18 DIAGNOSIS — I714 Abdominal aortic aneurysm, without rupture: Secondary | ICD-10-CM | POA: Diagnosis present

## 2016-08-18 DIAGNOSIS — E43 Unspecified severe protein-calorie malnutrition: Secondary | ICD-10-CM | POA: Diagnosis present

## 2016-08-18 DIAGNOSIS — J449 Chronic obstructive pulmonary disease, unspecified: Secondary | ICD-10-CM

## 2016-08-18 DIAGNOSIS — D638 Anemia in other chronic diseases classified elsewhere: Secondary | ICD-10-CM | POA: Diagnosis present

## 2016-08-18 DIAGNOSIS — R0602 Shortness of breath: Secondary | ICD-10-CM | POA: Diagnosis not present

## 2016-08-18 DIAGNOSIS — C159 Malignant neoplasm of esophagus, unspecified: Secondary | ICD-10-CM

## 2016-08-18 DIAGNOSIS — I2510 Atherosclerotic heart disease of native coronary artery without angina pectoris: Secondary | ICD-10-CM

## 2016-08-18 DIAGNOSIS — E785 Hyperlipidemia, unspecified: Secondary | ICD-10-CM | POA: Diagnosis not present

## 2016-08-18 DIAGNOSIS — Z681 Body mass index (BMI) 19 or less, adult: Secondary | ICD-10-CM | POA: Diagnosis not present

## 2016-08-18 DIAGNOSIS — I255 Ischemic cardiomyopathy: Secondary | ICD-10-CM | POA: Diagnosis present

## 2016-08-18 DIAGNOSIS — F17210 Nicotine dependence, cigarettes, uncomplicated: Secondary | ICD-10-CM | POA: Diagnosis not present

## 2016-08-18 DIAGNOSIS — I252 Old myocardial infarction: Secondary | ICD-10-CM | POA: Diagnosis not present

## 2016-08-18 DIAGNOSIS — Z7902 Long term (current) use of antithrombotics/antiplatelets: Secondary | ICD-10-CM | POA: Diagnosis not present

## 2016-08-18 DIAGNOSIS — K219 Gastro-esophageal reflux disease without esophagitis: Secondary | ICD-10-CM

## 2016-08-18 DIAGNOSIS — Z87820 Personal history of traumatic brain injury: Secondary | ICD-10-CM

## 2016-08-18 DIAGNOSIS — I739 Peripheral vascular disease, unspecified: Secondary | ICD-10-CM | POA: Diagnosis present

## 2016-08-18 DIAGNOSIS — Z79899 Other long term (current) drug therapy: Secondary | ICD-10-CM

## 2016-08-18 DIAGNOSIS — F419 Anxiety disorder, unspecified: Secondary | ICD-10-CM | POA: Diagnosis present

## 2016-08-18 DIAGNOSIS — E876 Hypokalemia: Secondary | ICD-10-CM | POA: Diagnosis present

## 2016-08-18 DIAGNOSIS — R0600 Dyspnea, unspecified: Secondary | ICD-10-CM | POA: Diagnosis present

## 2016-08-18 DIAGNOSIS — Z955 Presence of coronary angioplasty implant and graft: Secondary | ICD-10-CM | POA: Diagnosis not present

## 2016-08-18 DIAGNOSIS — Z8546 Personal history of malignant neoplasm of prostate: Secondary | ICD-10-CM | POA: Diagnosis not present

## 2016-08-18 DIAGNOSIS — Z85118 Personal history of other malignant neoplasm of bronchus and lung: Secondary | ICD-10-CM | POA: Diagnosis not present

## 2016-08-18 LAB — BASIC METABOLIC PANEL
Anion gap: 7 (ref 5–15)
BUN: 10 mg/dL (ref 6–20)
CALCIUM: 8.5 mg/dL — AB (ref 8.9–10.3)
CO2: 28 mmol/L (ref 22–32)
CREATININE: 0.72 mg/dL (ref 0.61–1.24)
Chloride: 100 mmol/L — ABNORMAL LOW (ref 101–111)
GFR calc non Af Amer: >60 mL/min (ref >60)
Glucose, Bld: 113 mg/dL — ABNORMAL HIGH (ref 65–99)
Potassium: 3 mmol/L — ABNORMAL LOW (ref 3.5–5.1)
Sodium: 135 mmol/L (ref 135–145)

## 2016-08-18 LAB — PHOSPHORUS: Phosphorus: 3.6 mg/dL (ref 2.5–4.6)

## 2016-08-18 LAB — TROPONIN I
Troponin I: <0.03 ng/mL (ref <0.03)
Troponin I: <0.03 ng/mL (ref <0.03)

## 2016-08-18 LAB — PROTIME-INR
INR: 1.03
Prothrombin Time: 13.5 seconds (ref 11.4–15.2)

## 2016-08-18 LAB — CBC
HCT: 26.3 % — ABNORMAL LOW (ref 40.0–52.0)
Hemoglobin: 8.5 g/dL — ABNORMAL LOW (ref 13.0–18.0)
MCH: 27.4 pg (ref 26.0–34.0)
MCHC: 32.5 g/dL (ref 32.0–36.0)
MCV: 84.4 fL (ref 80.0–100.0)
PLATELETS: 378 10*3/uL (ref 150–440)
RBC: 3.12 MIL/uL — AB (ref 4.40–5.90)
RDW: 23.3 % — AB (ref 11.5–14.5)
WBC: 8.5 10*3/uL (ref 3.8–10.6)

## 2016-08-18 LAB — APTT: APTT: 34 s (ref 24–36)

## 2016-08-18 LAB — MAGNESIUM: MAGNESIUM: 1.7 mg/dL (ref 1.7–2.4)

## 2016-08-18 MED ORDER — POTASSIUM CHLORIDE 20 MEQ PO PACK
20.0000 meq | PACK | Freq: Every day | ORAL | Status: DC
  Administered 2016-08-19 – 2016-08-20 (×2): 20 meq via ORAL
  Filled 2016-08-18 (×2): qty 1

## 2016-08-18 MED ORDER — ACETAMINOPHEN 500 MG PO TABS: 1000.0000 mg | ORAL_TABLET | ORAL | Status: DC | PRN

## 2016-08-18 MED ORDER — DEXTROMETHORPHAN POLISTIREX ER 30 MG/5ML PO SUER
30.0000 mg | Freq: Two times a day (BID) | ORAL | Status: DC
  Administered 2016-08-18 – 2016-08-20 (×5): 30 mg via ORAL
  Filled 2016-08-18 (×9): qty 5

## 2016-08-18 MED ORDER — POTASSIUM CHLORIDE 20 MEQ PO PACK
20.0000 meq | PACK | Freq: Once | ORAL | Status: AC
  Administered 2016-08-18: 20 meq via ORAL
  Filled 2016-08-18: qty 1

## 2016-08-18 MED ORDER — FENTANYL CITRATE (PF) 100 MCG/2ML IJ SOLN
INTRAMUSCULAR | Status: AC | PRN
  Administered 2016-08-18: 50 ug via INTRAVENOUS

## 2016-08-18 MED ORDER — CLOPIDOGREL BISULFATE 75 MG PO TABS
75.0000 mg | ORAL_TABLET | Freq: Every day | ORAL | Status: DC
  Administered 2016-08-18 – 2016-08-20 (×3): 75 mg via ORAL
  Filled 2016-08-18 (×3): qty 1

## 2016-08-18 MED ORDER — MIDAZOLAM HCL 5 MG/5ML IJ SOLN
INTRAMUSCULAR | Status: AC | PRN
  Administered 2016-08-18: 0.5 mg via INTRAVENOUS

## 2016-08-18 MED ORDER — MIDAZOLAM HCL 5 MG/5ML IJ SOLN
INTRAMUSCULAR | Status: AC
  Filled 2016-08-18: qty 5

## 2016-08-18 MED ORDER — FENTANYL CITRATE (PF) 100 MCG/2ML IJ SOLN
INTRAMUSCULAR | Status: AC
  Filled 2016-08-18: qty 4

## 2016-08-18 MED ORDER — DM-GUAIFENESIN ER 30-600 MG PO TB12: 1.0000 | ORAL_TABLET | Freq: Two times a day (BID) | ORAL | Status: DC

## 2016-08-18 MED ORDER — HEPARIN SODIUM (PORCINE) 5000 UNIT/ML IJ SOLN
5000.0000 [IU] | Freq: Three times a day (TID) | INTRAMUSCULAR | Status: DC
  Administered 2016-08-18 – 2016-08-20 (×6): 5000 [IU] via SUBCUTANEOUS
  Filled 2016-08-18 (×5): qty 1

## 2016-08-18 MED ORDER — POTASSIUM CHLORIDE 20 MEQ PO PACK
40.0000 meq | PACK | Freq: Every day | ORAL | Status: AC
  Administered 2016-08-18: 40 meq via ORAL
  Filled 2016-08-18: qty 2

## 2016-08-18 MED ORDER — GUAIFENESIN ER 600 MG PO TB12
600.0000 mg | ORAL_TABLET | Freq: Two times a day (BID) | ORAL | Status: DC
  Administered 2016-08-18 – 2016-08-20 (×5): 600 mg via ORAL
  Filled 2016-08-18 (×5): qty 1

## 2016-08-18 MED ORDER — SODIUM CHLORIDE 0.9 % IV SOLN
INTRAVENOUS | Status: AC
  Administered 2016-08-18: 09:00:00 via INTRAVENOUS

## 2016-08-18 NOTE — Consult Note (Signed)
Chief Complaint: Left pneumothorax  Referring Physician(s): Dr. Nestor Lewandowsky  Supervising Physician: Corrie Mckusick  Patient Status: [facility] - In-pt  History of Present Illness: [name] is a 76 y.o. male currently admitted in-pt to [facility].    He has a history of a left PTX, which was identified on CT 08/04/2016, and has enlarged with conservative management.  He continues to be symptomatic with SOB.    He has recently been treated for pneumonia, and he has underlying lung disease.    He has additional medical history of ischemic cardiomyopathy, esophageal cancer currently being treated with chemotherapy.     He tells me that he is comfortable now in bed but he presented through the ED with SOB 5/29.    He is currently taking Plavix.     Past Medical History:  Diagnosis Date  . AAA (abdominal aortic aneurysm) (HCC)    4.2 cm IR AAA noted on 01/13/15 PET scan  . Anxiety   . CAD (coronary artery disease)    AMI 1997 w/ BMS x 2 LAD, BMS CFX 2002, DES x 2 LAD 2009, DES CFX & OM 2012   . Carotid artery disease ([place])    RICA CEA 4481, LICA CEA 8563 per Dr. [name],   . COPD (chronic obstructive pulmonary disease) ([place])   . GERD (gastroesophageal reflux disease)    occ  . HTN (hypertension)   . Hydradenitis   . Hyperlipidemia   . Ischemic cardiomyopathy    EF 40-45 percent at cath 2012  . Myocardial infarction ([place])   . Non-small cell lung cancer (NSCLC) ([place]) dx'd 12/2014   SBRT  . Osteoarthritis    pt. denies 01/17/2015  . PAC (premature atrial contraction)   . Peripheral vascular disease ([place])   . Pneumonia   . Prostate cancer ([place]) dx'd approx 2008   xrt throughfoley  . Radiation 02/18/15-02/25/15   right upper lung 54 Gy  . Shortness of breath dyspnea   . Stroke [facility]) 11-24-11   "no feeling in right 5th digit"  . Urinary frequency   . Urinary urgency     Past Surgical History:  Procedure Laterality Date  . CARDIAC CATHETERIZATION    . CARDIOVASCULAR  STRESS TEST  12-2006  . CARDIOVERSION N/A 10/31/2015   Procedure: CARDIOVERSION;  Surgeon: Sueanne Margarita, MD;  Location: [place];  Service: Cardiovascular;  Laterality: N/A;  . CAROTID ENDARTERECTOMY Right 03-2000   CE  . CAROTID ENDARTERECTOMY Left 11-24-11   CE  . COLONOSCOPY  04-2001   polyp  . [place], 2002, 2009, 2012   8 stents total  . [place]   neg  . ENDARTERECTOMY  11/24/2011   Procedure: ENDARTERECTOMY CAROTID;  Surgeon: Rosetta Posner, MD;  Location: [place];  Service: Vascular;  Laterality: Left;  . EYE SURGERY Bilateral Aug. 17, 2015   Cataract  . FUDUCIAL PLACEMENT N/A 12/25/2014   Procedure: PLACEMENT OF FUDUCIAL;  Surgeon: Collene Gobble, MD;  Location: [place];  Service: Thoracic;  Laterality: N/A;  . IR GENERIC HISTORICAL  03/18/2016   IR US GUIDE VASC ACCESS LEFT 03/18/2016 Jacqulynn Cadet, MD [facility]-INTERV RAD  . IR GENERIC HISTORICAL  03/18/2016   IR FLUORO GUIDE PORT INSERTION LEFT 03/18/2016 Jacqulynn Cadet, MD [facility]-INTERV RAD  . PROSTATE SURGERY     Radiation Tx  X's 40  . TEE WITHOUT CARDIOVERSION N/A 10/31/2015   Procedure: TRANSESOPHAGEAL ECHOCARDIOGRAM (TEE);  Surgeon: Sueanne Margarita,  MD;  Location: [place];  Service: Cardiovascular;  Laterality: N/A;  . VASCULAR SURGERY    . VIDEO BRONCHOSCOPY WITH ENDOBRONCHIAL NAVIGATION N/A 12/25/2014   Procedure: VIDEO BRONCHOSCOPY WITH ENDOBRONCHIAL NAVIGATION  with Biopsies and Brushings;  Surgeon: Collene Gobble, MD;  Location: [place];  Service: Thoracic;  Laterality: N/A;  . VIDEO BRONCHOSCOPY WITH ENDOBRONCHIAL ULTRASOUND N/A 01/22/2015   Procedure: VIDEO BRONCHOSCOPY WITH ENDOBRONCHIAL ULTRASOUND with Biopsies;  Surgeon: Collene Gobble, MD;  [facility];  Service: Thoracic;  Laterality: N/A;    Allergies: Augmentin [amoxicillin-pot clavulanate]; Chantix [varenicline]; Sulfonamide derivatives; and Zantac [ranitidine hcl]  Medications: Prior to Admission medications     Medication Sig Start Date End Date Taking? Authorizing Provider  acetaminophen (TYLENOL) 500 MG tablet Take 1,000 mg by mouth every 4 (four) hours as needed for mild pain, moderate pain, fever or headache.  10/17/15  Yes [provider]  ALPRAZolam (XANAX) 0.5 MG tablet Take 0.5-1 tablets (0.25-0.5 mg total) by mouth daily as needed for anxiety or sleep. 05/18/16  Yes Owens Shark, NP  clopidogrel (PLAVIX) 75 MG tablet TAKE 1 TABLET BY MOUTH EVERY DAY 06/07/16  Yes Burnell Blanks, MD  diphenoxylate-atropine (LOMOTIL) 2.5-0.025 MG tablet Take 1-2 tablets by mouth 4 (four) times daily as needed for diarrhea or loose stools. 06/15/16  Yes Owens Shark, NP  fludrocortisone (FLORINEF) 0.1 MG tablet Take 1 tablet (0.1 mg total) by mouth daily. 08/05/16  Yes Tower, Wynelle Fanny, MD  fluticasone-salmeterol (ADVAIR HFA) 419-62 MCG/ACT inhaler Inhale 2 puffs into the lungs 2 (two) times daily.   Yes [provider]  ipratropium-albuterol (DUONEB) 0.5-2.5 (3) MG/3ML SOLN Take 3 mLs by nebulization every 4 (four) hours as needed (shortness of breath and wheezing).   Yes [provider]  lidocaine (XYLOCAINE) 2 % solution Use as directed 20 mLs in the mouth or throat as needed for mouth pain.   Yes [provider]  lidocaine-prilocaine (EMLA) cream Apply to port site one hour prior to use. Do not rub in. Cover with plastic. 07/07/16  Yes Ladell Pier, MD  loratadine (CLARITIN) 10 MG tablet TAKE 1 TABLET BY MOUTH EVERY DAY 07/22/16  Yes Collene Gobble, MD  mirtazapine (REMERON) 15 MG tablet TAKE 1 TABLET (15 MG TOTAL) BY MOUTH AT BEDTIME. 07/05/16  Yes Tower, Wynelle Fanny, MD  Multiple Vitamin (MULTIVITAMIN WITH MINERALS) TABS tablet Take 1 tablet by mouth daily.   Yes [provider]  nitroGLYCERIN (NITROSTAT) 0.4 MG SL tablet Place 0.4 mg under the tongue every 5 (five) minutes as needed for chest pain.   Yes [provider]  omeprazole (PRILOSEC) 40 MG  capsule TAKE 1 CAPSULE 30 MINUTES BEFORE BREAKFAST AND DINNER 08/04/16  Yes Ladene Artist, MD  potassium chloride 20 MEQ/15ML (10%) SOLN Take 15 mLs (20 mEq total) by mouth daily. 08/06/16  Yes Owens Shark, NP  prochlorperazine (COMPAZINE) 5 MG tablet Take 1 tablet (5 mg total) by mouth every 6 (six) hours as needed for nausea or vomiting. 07/27/16  Yes Ladell Pier, MD  tiotropium (SPIRIVA) 18 MCG inhalation capsule Place 1 capsule (18 mcg total) into inhaler and inhale daily. 02/24/16  Yes Parrett, Tammy S, NP  traMADol (ULTRAM) 50 MG tablet Take 1 tablet (50 mg total) by mouth every 6 (six) hours as needed. 07/29/16  Yes Ladell Pier, MD  VENTOLIN HFA 108 (90 Base) MCG/ACT inhaler INHALE 2 PUFFS INTO THE LUNGS EVERY 4 (FOUR) HOURS AS  NEEDED FOR WHEEZING OR SHORTNESS OF BREATH. 05/12/16  Yes Tower, Wynelle Fanny, MD  predniSONE (DELTASONE) 20 MG tablet Take 0.5 tablets (10 mg total) by mouth every other day. Please continue after you take 50 mg prednisone for 5 days To stimulate appetite Patient not taking: Reported on 08/17/2016 08/05/16   Bettey Costa, MD     Family History  Problem Relation Age of Onset  . Stroke Father   . Heart disease Father   . Heart disease Mother        pacemaker  . Other Brother        benign brain tumor  . Cancer Brother        Eye-behind  . Cancer Sister        Breast  . Liver cancer Brother   . Diabetes Maternal Grandmother   . Colon polyps Neg Hx   . Esophageal cancer Neg Hx     Social History   Social History  . Marital status: Widowed    Spouse name: N/A  . Number of children: 5  . Years of education: N/A   Occupational History  . Retired Airline pilot   . Part time, delivering auto [employer]   Social History Main Topics  . Smoking status: Current Every Day Smoker    Packs/day: 1.00    Years: 55.00    Types: Cigarettes  . Smokeless tobacco: Never Used     Comment: 1/2 ppd (01/07/15)  . Alcohol use 0.0 oz/week     Comment: occ  .  Drug use: No  . Sexual activity: Not Asked   Other Topics Concern  . None   Social History Narrative   Widowed, lives next door to his step-daughter, Antony Haste   Independent in ADLs and drives   Prior worked delivering car parts       [place] discussed and pertinent positives are indicated in the HPI above.  All other systems are negative.  Review of Systems  Vital Signs: BP 137/83   Pulse 80   Temp 97.5 F (36.4 C) (Oral)   Resp 12   Ht 5\' 8"  (1.727 m)   Wt 124 lb (56.2 kg)   SpO2 100%   BMI 18.85 kg/m   Physical Exam General: 76 yo male appearing older than stated age.  Thin appearing in bed.  HEENT: Atraumatic, normocephalic.  Conjugate gaze, extra-ocular motor intact. No scleral icterus or scleral injection. No lesions on external ears, nose, lips, or gums.  Oral mucosa moist, pink.  Neck: Symmetric with no goiter enlargement.  Chest/Lungs:  Symmetric chest with inspiration/expiration.  No labored breathing.  Right is clear to auscultation.  Decreased left lung sound.  Heart:  RRR, with no third heart sounds appreciated. No JVD appreciated.  Abdomen:  Soft, NT/ND, with + bowel sounds.   Genito-urinary: Deferred Neurologic: Alert & Oriented to person, place, and time.   Normal affect and insight.  Appropriate questions.  Moving all 4 extremities with gross sensory intact.     Mallampati Score: 2    Imaging: Dg Chest 1 View  Result Date: 08/05/2016 CLINICAL DATA:  Spontaneous left pneumothorax. EXAM: CHEST 1 VIEW COMPARISON:  CT of the chest on 08/04/2016 FINDINGS: Small left apical pneumothorax present of approximately 5-10 percent volume. 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 Airspace disease present at the right lung base with associated small right pleural effusion. No edema. Stable tortuosity of the thoracic aorta. Stable positioning of Port-A-Cath. IMPRESSION:  5-10% left-sided pneumothorax. Right basilar pneumonia with  small right pleural effusion. Electronically Signed   By: Aletta Edouard M.D.   On: 08/05/2016 08:57   Dg Chest 2 View  Result Date: 08/17/2016 CLINICAL DATA:  76 year old male with history of known pneumonia, bronchitis, and COPD presenting with shortness of breath x3 days. Patient has history of lung cancer and pneumothorax. EXAM: CHEST  2 VIEW COMPARISON:  Chest radiograph dated 08/07/2016 FINDINGS: Left pectoral Port-A-Cath with tip in stable positioning at cavoatrial junction. There has been interval increase in the size of the left-sided pneumothorax measuring approximately 28 mm from the pleural surface (previously 10 mm). There is a small right pleural effusion with patchy area of density at the right lung base consistent with atelectasis versus infiltrate. This is progressed compared to prior radiograph. There is emphysematous changes of the lungs. Multiple surgical clips with adjacent subpleural postsurgical changes and scarring noted in the right upper lobe. There is stable cardiac size. The aorta is tortuous. There is osteopenia with degenerative changes of the spine. No acute osseous pathology noted. IMPRESSION: 1. Slight interval increase in the size of the left pneumothorax compared to the prior radiograph and CT. 2. Emphysema. 3. Small right pleural effusion and right lung base patchy opacity similar or slightly progressed compared to prior radiograph. 4. Postsurgical changes of right upper lobe. These results were called by telephone at the time of interpretation on 08/17/2016 at 6:48 pm to Dr. Joni Fears, who verbally acknowledged these results. Electronically Signed   By: Anner Crete M.D.   On: 08/17/2016 18:49   Dg Chest 2 View  Result Date: 08/07/2016 CLINICAL DATA:  Weakness, recent pneumonia and pneumothorax, history of lung cancer EXAM: CHEST  2 VIEW COMPARISON:  08/05/2016 FINDINGS: Stable small left apical pneumothorax. Volume loss in the right hemithorax. Fiducial markers in  the right upper lung with adjacent scarring. Small right pleural effusion. Right basilar opacity, atelectasis versus pneumonia. The heart is normal in size. Left chest port terminates the cavoatrial junction. IMPRESSION: Stable small left apical pneumothorax. Right basilar opacity, atelectasis versus pneumonia. Small right pleural effusion. Electronically Signed   By: Julian Hy M.D.   On: 08/07/2016 17:42   Ct Chest W Contrast  Result Date: 07/21/2016 CLINICAL DATA:  Non-small-cell lung carcinoma carcinoma. Esophageal carcinoma. Prostate carcinoma. Chronic cough. EXAM: CT CHEST, ABDOMEN, AND PELVIS WITH CONTRAST TECHNIQUE: Multidetector CT imaging of the chest, abdomen and pelvis was performed following the standard protocol during bolus administration of intravenous contrast. CONTRAST:  154mL ISOVUE-300 IOPAMIDOL (ISOVUE-300) INJECTION 61% COMPARISON:  None. FINDINGS: CT CHEST FINDINGS Cardiovascular: Coronary artery calcification and aortic atherosclerotic calcification. Mediastinum/Nodes: No axillary or supraclavicular adenopathy. No mediastinal hilar adenopathy. No pericardial fluid. Esophagus normal. Lungs/Pleura: Peripheral RIGHT upper lobe nodule measures 9 mm decreased from 12 mm (image 53, series 4). RIGHT apical nodule measures 5 mm (image image 47, series 4) and unchanged. Within the LEFT upper lobe subpleural nodule measuring 6 mm is unchanged. Medial LEFT upper lobe nodule measuring 6 mm (image 78, series 4) not changed. No new pulmonary nodules. Small bilateral pleural effusions similar prior. Small focus consolidation at the RIGHT lung base (image 148, series 4) is new. Musculoskeletal: Widespread lytic and sclerotic lesions throughout the spine are unchanged. Expansile medial LEFT first rib lesion is unchanged. CT ABDOMEN PELVIS FINDINGS Hepatobiliary: No focal hepatic lesion.  No biliary duct dilatation. Pancreas: Pancreas is normal. No ductal dilatation. No pancreatic inflammation.  Spleen: Normal spleen Adrenals/Urinary Tract: Adrenal glands are normal.  Large benign cyst of the LEFT kidney. No renal obstruction. Bladder normal per Stomach/Bowel: Small hiatal hernia. The duodenum small-bowel normal. Appendix normal. Moderate volume stool throughout the colon. No obstructing lesion within the colon. Rectum normal. Vascular/Lymphatic: Abdominal aortic aneurysmal below the renal arteries measures 44 mm similar 44 mm. Calcified gastrohepatic ligament lymph nodes measuring 18 mm is unchanged. No new lymphadenopathy. Reproductive: Prostate normal Other: RIGHT inguinal hernia a small of fluid and this a knuckle of small bowel extends into a proximal aspect of hernia. Musculoskeletal: Expansile lesion in the LEFT iliac wing with scattered calcifications measures 5.4 cm by 4.5 cm compared to 4.2 x 4.3 cm for some interval increase in size. Widespread lytic and sclerotic lesions in the spine pelvis are unchanged. Lesion extending from the LEFT transverse process of L2 (image 75, series 2) measures 18 mm and is new from prior. IMPRESSION: Chest Impression: 1. Majority of the pulmonary nodules are unchanged from comparison exam. 2. Nodule in the RIGHT upper lobe laterally is decreased in volume. 3. No new pulmonary nodules. 4. Stable lytic, sclerotic and expansile skeletal lesions within the thorax. Abdomen / Pelvis Impression: 1. Stable lymphadenopathy in the gastrohepatic ligament. 2. Interval increase in soft tissue the component of bone metastasis within LEFT iliac wing and LEFT transverse process at L2. 3. Small RIGHT inguinal hernia contains a short segment of small bowel and fluid. 4. Stable infrarenal abdominal aortic aneurysm at 43 mm. Electronically Signed   By: Suzy Bouchard M.D.   On: 07/21/2016 12:38   Ct Angio Chest Pe W Or Wo Contrast  Result Date: 08/17/2016 CLINICAL DATA:  Chemotherapy ongoing. RIGHT pneumothorax. LEFT pneumothorax. EXAM: CT ANGIOGRAPHY CHEST WITH CONTRAST  TECHNIQUE: Multidetector CT imaging of the chest was performed using the standard protocol during bolus administration of intravenous contrast. Multiplanar CT image reconstructions and MIPs were obtained to evaluate the vascular anatomy. CONTRAST:  Study 5 mL Isovue COMPARISON:  CT 08/04/2016, chest radiograph 08/17/2016, CT 04/27/2016 FINDINGS: Cardiovascular: Coronary artery calcification and aortic atherosclerotic calcification. Mediastinum/Nodes: No axillary or supraclavicular adenopathy. New mediastinal or hilar adenopathy. No pericardial fluid. No central pulmonary embolism. Lungs/Pleura: Moderate LEFT pneumothorax has increased in volume from CT of 08/04/2016. Pneumothorax occupies approximately 30% of lung volume. Nodule within the LEFT upper lobe measures 6 mm (image 47, series 6) unchanged. Nodule in the RIGHT upper lobe measuring 8 mm (image 32, series 6) decreased from 12 mm on comparison CT of 04/27/2016. More inferior nodule measuring 4 mm is unchanged from 05-26-2022. Nodular peripheral consolidation in the RIGHT lower lobe is improved. Small RIGHT effusion. Upper Abdomen: Limited view of the liver, kidneys, pancreas are unremarkable. Normal adrenal glands. Large LEFT renal cyst. Musculoskeletal: Expansile lesion in the anterior medial first rib is again demonstrated measuring 2.7 cm. Lesion within the T3 and T4 vertebral bodies are increased from CT in 26-May-2022. For example lesion at T3 on the LEFT measures 2.6 cm increased from 1.2 cm (image 15, series 4. Lesion at T4 on the RIGHT measures 2.2 cm increased from 0.8 cm. Initial sclerotic and lesions lytic lesion more inferiorly are similar. Review of the MIP images confirms the above findings. IMPRESSION: 1. Interval increase in volume of LEFT pneumothorax occupying 30% LEFT hemithorax volume. 2. Stable bilateral pulmonary nodules. 3. Interval increase in soft tissue component of lytic lesions at T3 and T4 compared to CT of favor 04/27/2016. 4.  Improvement in consolidation in the RIGHT lower lobe. Critical Value/emergent results were called by telephone at the time  of interpretation on 08/17/2016 at 8:08 pm to Dr. Carrie Mew , who verbally acknowledged these results. Electronically Signed   By: Suzy Bouchard M.D.   On: 08/17/2016 20:03   Ct Angio Chest Pe W Or Wo Contrast  Result Date: 08/04/2016 CLINICAL DATA:  Chest pain. Shortness of breath. History of esophageal cancer. EXAM: CT ANGIOGRAPHY CHEST WITH CONTRAST TECHNIQUE: Multidetector CT imaging of the chest was performed using the standard protocol during bolus administration of intravenous contrast. Multiplanar CT image reconstructions and MIPs were obtained to evaluate the vascular anatomy. CONTRAST:  75 mL of Isovue 370 intravenously. COMPARISON:  CT scan of Jul 21, 2016. FINDINGS: Cardiovascular: Atherosclerosis of thoracic aorta is noted without aneurysm or dissection. There is no definite evidence of pulmonary embolus. Coronary artery calcifications are noted. Normal cardiac size. No pericardial effusion is noted. Mediastinum/Nodes: No enlarged mediastinal, hilar, or axillary lymph nodes. Thyroid gland, trachea, and esophagus demonstrate no significant findings. Lungs/Pleura: Mild left pneumothorax is noted. Mild right pleural effusion is noted. Right lower lobe atelectasis or pneumonia is noted. Stable 8 mm subpleural nodule is noted in left upper lobe. Stable 9 mm nodule is noted more medially in left upper lobe. Stable 4 mm right upper lobe nodule is noted. Stable density is seen laterally in right upper lobe most consistent with scarring. Bulla formation is noted in the right upper lobe. Upper Abdomen: Stable large left renal cyst. No other definite abnormality seen in visualized portion of upper abdomen. Musculoskeletal: Multiple sclerotic and lytic lesions are seen throughout the ribs and spine consistent with metastatic disease. Review of the MIP images confirms the above  findings. IMPRESSION: Aortic atherosclerosis. Coronary artery calcifications are noted suggesting coronary artery disease. No definite evidence of pulmonary embolus. Stable bilateral pulmonary nodules are noted concerning for metastatic disease. Stable osseous metastases. Mild right pleural effusion is noted. Right lower lobe atelectasis or pneumonia is noted. Interval development of mild left pneumothorax which is less than 10%. Critical Value/emergent results were called by telephone at the time of interpretation on 08/04/2016 at 3:03 pm to Dr. Rudene Re , who verbally acknowledged these results. Electronically Signed   By: Marijo Conception, M.D.   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 Chronic cough. EXAM: CT CHEST, ABDOMEN, AND PELVIS WITH CONTRAST TECHNIQUE: Multidetector CT imaging of the chest, abdomen and pelvis was performed following the standard protocol during bolus administration of intravenous contrast. CONTRAST:  172mL ISOVUE-300 IOPAMIDOL (ISOVUE-300) INJECTION 61% COMPARISON:  None. FINDINGS: CT CHEST FINDINGS Cardiovascular: Coronary artery calcification and aortic atherosclerotic calcification. Mediastinum/Nodes: No axillary or supraclavicular adenopathy. No mediastinal hilar adenopathy. No pericardial fluid. Esophagus normal. Lungs/Pleura: Peripheral RIGHT upper lobe nodule measures 9 mm decreased from 12 mm (image 53, series 4). RIGHT apical nodule measures 5 mm (image image 47, series 4) and unchanged. Within the LEFT upper lobe subpleural nodule measuring 6 mm is unchanged. Medial LEFT upper lobe nodule measuring 6 mm (image 78, series 4) not changed. No new pulmonary nodules. Small bilateral pleural effusions similar prior. Small focus consolidation at the RIGHT lung base (image 148, series 4) is new. Musculoskeletal: Widespread lytic and sclerotic  lesions throughout the spine are unchanged. Expansile medial LEFT first rib lesion is unchanged. CT ABDOMEN PELVIS FINDINGS Hepatobiliary: No focal hepatic lesion.  No biliary duct dilatation. Pancreas: Pancreas is normal. No ductal dilatation. No pancreatic inflammation. Spleen: Normal spleen Adrenals/Urinary Tract:  Adrenal glands are normal. Large benign cyst of the LEFT kidney. No renal obstruction. Bladder normal per Stomach/Bowel: Small hiatal hernia. The duodenum small-bowel normal. Appendix normal. Moderate volume stool throughout the colon. No obstructing lesion within the colon. Rectum normal. Vascular/Lymphatic: Abdominal aortic aneurysmal below the renal arteries measures 44 mm similar 44 mm. Calcified gastrohepatic ligament lymph nodes measuring 18 mm is unchanged. No new lymphadenopathy. Reproductive: Prostate normal Other: RIGHT inguinal hernia a small of fluid and this a knuckle of small bowel extends into a proximal aspect of hernia. Musculoskeletal: Expansile lesion in the LEFT iliac wing with scattered calcifications measures 5.4 cm by 4.5 cm compared to 4.2 x 4.3 cm for some interval increase in size. Widespread lytic and sclerotic lesions in the spine pelvis are unchanged. Lesion extending from the LEFT transverse process of L2 (image 75, series 2) measures 18 mm and is new from prior. IMPRESSION: Chest Impression: 1. Majority of the pulmonary nodules are unchanged from comparison exam. 2. Nodule in the RIGHT upper lobe laterally is decreased in volume. 3. No new pulmonary nodules. 4. Stable lytic, sclerotic and expansile skeletal lesions within the thorax. Abdomen / Pelvis Impression: 1. Stable lymphadenopathy in the gastrohepatic ligament. 2. Interval increase in soft tissue the component of bone metastasis within LEFT iliac wing and LEFT transverse process at L2. 3. Small RIGHT inguinal hernia contains a short segment of small bowel and fluid. 4. Stable infrarenal abdominal aortic aneurysm at  43 mm. Electronically Signed   By: Suzy Bouchard M.D.   On: 07/21/2016 12:38    Labs:  CBC:  Recent Labs  08/07/16 1652 08/08/16 0514 08/17/16 1725 08/18/16 0230  WBC 6.8 5.5 10.0 8.5  HGB 8.6* 7.7* 9.3* 8.5*  HCT 26.3* 23.3* 28.0* 26.3*  PLT 481* 412 419 378    COAGS:  Recent Labs  03/03/16 1010 03/18/16 1007 08/18/16 0854  INR 1.13 1.08 1.03  APTT  --  41* 34    BMP:  Recent Labs  08/07/16 1652 08/08/16 0514 08/11/16 1911 08/17/16 1725 08/18/16 0230  NA 135 134* 134* 132* 135  K 3.4* 3.7 3.4* 3.5 3.0*  CL 97* 102 98 97* 100*  CO2 26 27 29 27 28   GLUCOSE 109* 143* 110* 138* 113*  BUN 15 15 16 12 10   CALCIUM 8.5* 8.2* 8.6 8.7* 8.5*  CREATININE 1.01 0.82 1.06 0.79 0.72  GFRNONAA >60 >60  --  >60 >60  GFRAA >60 >60  --  >60 >60    LIVER FUNCTION TESTS:  Recent Labs  07/27/16 0800 08/07/16 1652 08/11/16 1911 08/17/16 1725  BILITOT <0.22 0.4 0.2 0.7  AST 13 23 14 22   ALT 10 10* 10 13*  ALKPHOS 82 79 79 100  PROT 6.0* 6.3* 5.8* 6.3*  ALBUMIN 2.8* 2.9* 3.2* 3.2*    TUMOR MARKERS: No results for input(s): AFPTM, CEA, CA199, CHROMGRNA in the last 8760 hours.  Assessment and Plan:  76 yo male with increasing size of left PTX over 2 weeks with conservative management, and worsening SOB.    Thoracostomy tube is indicated.   Have discussed with Dr. Genevive Bi.  Plan to proceed today.   Though he is taking plavix, I think the benefit of treatment outweighs the small risk of hemorrhage in this case.   Risks and Benefits discussed with the patient including bleeding, infection, damage to adjacent structures, bowel perforation/fistula connection, and sepsis. All of the patient's questions were answered, patient is agreeable to proceed. Consent signed and in chart.  Thank you for this interesting consult.  I greatly enjoyed meeting DANTONIO JUSTEN and look forward to participating in their care.  A copy of this report was sent to the requesting provider  on this date.  Electronically Signed: Corrie Mckusick, DO 08/18/2016, 11:09 AM   I spent a total of 20 Minutes    in face to face in clinical consultation, greater than 50% of which was counseling/coordinating care for symptomatic left pneumothorax, left chest tube placement.

## 2016-08-18 NOTE — Progress Notes (Signed)
Family Meeting Note  Advance Directive:yes  Today a meeting took place with the Patient.     The following clinical team members were present during this meeting:MD  The following were discussed:Patient's diagnosis PTX Esophageal cancer , Patient's progosis: Unable to determine and Goals for treatment: Full Code  Additional follow-up to be provided: None  Time spent during discussion:16 minutes  Ian Kohrs, MD

## 2016-08-18 NOTE — Progress Notes (Addendum)
MEDICATION RELATED CONSULT NOTE - INITIAL   Pharmacy Consult for electrolyte management Indication: hypokalemia  Allergies  Allergen Reactions  . Augmentin [Amoxicillin-Pot Clavulanate] Diarrhea, Nausea And Vomiting and Other (See Comments)    Has patient had a PCN reaction causing immediate rash, facial/tongue/throat swelling, SOB or lightheadedness with hypotension: No Has patient had a PCN reaction causing severe rash involving mucus membranes or skin necrosis: No Has patient had a PCN reaction that required hospitalization No Has patient had a PCN reaction occurring within the last 10 years: Yes If all of the above answers are "NO", then may proceed with Cephalosporin use.   . Chantix [Varenicline] Other (See Comments)    Violent nightmares  . Sulfonamide Derivatives Other (See Comments)    Pt do not remember 8-30 md  . Zantac [Ranitidine Hcl] Diarrhea    Patient Measurements: Height: 5\' 8"  (172.7 cm) Weight: 124 lb (56.2 kg) IBW/kg (Calculated) : 68.4 Adjusted Body Weight:   Vital Signs: Temp: 97.5 F (36.4 C) (05/30 0555) Temp Source: Oral (05/30 0555) BP: 157/91 (05/30 0555) Pulse Rate: 77 (05/30 0555) Intake/Output from previous day: 05/29 0701 - 05/30 0700 In: -  Out: 100 [Urine:100] Intake/Output from this shift: Total I/O In: 0  Out: 600 [Urine:600]  Labs:  Recent Labs  08/17/16 1725 08/18/16 0230  WBC 10.0 8.5  HGB 9.3* 8.5*  HCT 28.0* 26.3*  PLT 419 378  CREATININE 0.79 0.72  ALBUMIN 3.2*  --   PROT 6.3*  --   AST 22  --   ALT 13*  --   ALKPHOS 100  --   BILITOT 0.7  --    Estimated Creatinine Clearance: 62.4 mL/min (by C-G formula based on SCr of 0.72 mg/dL).   Microbiology: Recent Results (from the past 720 hour(s))  TECHNOLOGIST REVIEW     Status: None   Collection Time: 07/27/16  8:00 AM  Result Value Ref Range Status   Technologist Review   Final    Rare meta on scan, Sl poly, Few ovalos and schistocytes  Urine culture      Status: None   Collection Time: 08/07/16  4:58 PM  Result Value Ref Range Status   Specimen Description URINE, RANDOM  Final   Special Requests NONE  Final   Culture   Final    NO GROWTH Performed at [facility], [address], [place], [place] [address]    Report Status 08/09/2016 FINAL  Final    Medical History: Past Medical History:  Diagnosis Date  . AAA (abdominal aortic aneurysm) (HCC)    4.2 cm IR AAA noted on 01/13/15 PET scan  . Anxiety   . CAD (coronary artery disease)    AMI 1997 w/ BMS x 2 LAD, BMS CFX 2002, DES x 2 LAD 2009, DES CFX & OM 2012   . Carotid artery disease ([place])    RICA CEA 0177, LICA CEA 9390 per Dr. [name],   . COPD (chronic obstructive pulmonary disease) ([place])   . GERD (gastroesophageal reflux disease)    occ  . HTN (hypertension)   . Hydradenitis   . Hyperlipidemia   . Ischemic cardiomyopathy    EF 40-45 percent at cath 2012  . Myocardial infarction ([place])   . Non-small cell lung cancer (NSCLC) ([place]) dx'd 12/2014   SBRT  . Osteoarthritis    pt. denies 01/17/2015  . PAC (premature atrial contraction)   . Peripheral vascular disease ([place])   . Pneumonia   . Prostate cancer (  [place]) dx'd approx 2008   xrt throughfoley  . Radiation 02/18/15-02/25/15   right upper lung 54 Gy  . Shortness of breath dyspnea   . Stroke [facility]) 11-24-11   "no feeling in right 5th digit"  . Urinary frequency   . Urinary urgency     Medications:  Infusions:  . sodium chloride      Assessment: 76 yom cc SOB, hypokalemia noted this morning, pharmacy consulted to manage electrolytes.  Goal of Therapy:  Electrolytes WNL  Plan:  Daily potassium ordered. Will increase dose to 40 meq this AM, 20 meq this PM, then resume regular dosing of 20 meq po daily. Add-on magnesium and phosphorus pending. Will recheck all electrolytes tomorrow with AM labs.  5/30 0854 Mg and Phos WNL. Will recheck tomorrow with AM labs.   Laural Benes, Pharm.D., BCPS Clinical  Pharmacist 08/18/2016,8:31 AM

## 2016-08-18 NOTE — Progress Notes (Signed)
PT Cancellation Note  Patient Details Name: CARLIN ATTRIDGE MRN: [id number] DOB: Jul 27, 1940   Cancelled Treatment:    Reason Eval/Treat Not Completed: Other (comment). Consult received and chart reviewed. Pt currently with surgical plans for drainage and placement of percutaneous catheter for L pneumothorax. Will hold therapy consult until medically stable.   Heidie Krall 08/18/2016, 8:58 AM Greggory Stallion, PT, DPT [phone]

## 2016-08-18 NOTE — Progress Notes (Signed)
PT Cancellation Note  Patient Details Name: [name] MRN: [id number] DOB: 02-Jul-1940   Cancelled Treatment:    Reason Eval/Treat Not Completed: Other (comment). Spoke to RN, pt currently off floor for chest tube insertion. Recommends PT to start next date. Will evaluate tomorrow.   [name] 08/18/2016, 11:28 AM   Greggory Stallion, PT, DPT [phone]

## 2016-08-18 NOTE — Consult Note (Signed)
[facility] CONSULT NOTE  Patient Care Team: Tower, Wynelle Fanny, MD as PCP - General Rolan Bucco, MD as Consulting Physician (Urology) Burnell Blanks, MD as Consulting Physician (Cardiology)  CHIEF COMPLAINTS/PURPOSE OF CONSULTATION: Esophageal cancer- metastatic chemotherapy  HISTORY OF PRESENTING ILLNESS:  [name] 76 y.o.  male with a history of metastatic disease esophageal cancer- metastatic currently on second line therapy with Taxol weekly [Dr.Sherrill; in [place]] is currently admitted to the hospital for sudden onset of shortness of breath. Patient on admission noted to have a left-sided pneumothorax which was initially managed conservatively; however given the nonresolution pneumothorax/ patient ended up having a chest tube placed through IR.   Regards to esophagus cancer- diagnosed in November 2017- with metastases to the abdomen and also thoracic and lumbar vertebrae; Lung nodules. Patient was initially treated with FOLFOX chemotherapy- sometime in January 2018. Given the progressive disease in the osseous metastatic lesion- patient also received left femur radiation/and also left scapula Patient was most recently started on Taxol weekly; however had multiple interruptions because of poor tolerance/ most recently hospitalization.  Patient has intermittent diarrhea/alternating with constipation. Complains of mild tingling and numbness of his extremities. No nausea vomiting with the chemotherapy. In general has lost weight. Intermittent difficulty swallowing.  On the most recent CT scan while in hospital to have slight increase in the soft tissue component of the left iliac pain soft tissue met; also L2 soft tissue met. Patient continues to have chronic pain in that area. Not significantly worse  ROS: A complete 10 point review of system is done which is negative except mentioned above in history of present illness  MEDICAL HISTORY:  Past Medical  History:  Diagnosis Date  . AAA (abdominal aortic aneurysm) (HCC)    4.2 cm IR AAA noted on 01/13/15 PET scan  . Anxiety   . CAD (coronary artery disease)    AMI 1997 w/ BMS x 2 LAD, BMS CFX 2002, DES x 2 LAD 2009, DES CFX & OM 2012   . Carotid artery disease ([place])    RICA CEA 5449, LICA CEA 2010 per Dr. [name],   . COPD (chronic obstructive pulmonary disease) ([place])   . GERD (gastroesophageal reflux disease)    occ  . HTN (hypertension)   . Hydradenitis   . Hyperlipidemia   . Ischemic cardiomyopathy    EF 40-45 percent at cath 2012  . Myocardial infarction ([place])   . Non-small cell lung cancer (NSCLC) ([place]) dx'd 12/2014   SBRT  . Osteoarthritis    pt. denies 01/17/2015  . PAC (premature atrial contraction)   . Peripheral vascular disease ([place])   . Pneumonia   . Prostate cancer ([place]) dx'd approx 2008   xrt throughfoley  . Radiation 02/18/15-02/25/15   right upper lung 54 Gy  . Shortness of breath dyspnea   . Stroke [facility]) 11-24-11   "no feeling in right 5th digit"  . Urinary frequency   . Urinary urgency     SURGICAL HISTORY: Past Surgical History:  Procedure Laterality Date  . CARDIAC CATHETERIZATION    . CARDIOVASCULAR STRESS TEST  12-2006  . CARDIOVERSION N/A 10/31/2015   Procedure: CARDIOVERSION;  Surgeon: Sueanne Margarita, MD;  Location: [facility];  Service: Cardiovascular;  Laterality: N/A;  . CAROTID ENDARTERECTOMY Right 03-2000   CE  . CAROTID ENDARTERECTOMY Left 11-24-11   CE  . COLONOSCOPY  04-2001   polyp  . [place], 2002, 2009, 2012   8  stents total  . [place]   neg  . ENDARTERECTOMY  11/24/2011   Procedure: ENDARTERECTOMY CAROTID;  Surgeon: Rosetta Posner, MD;  Location: [place];  Service: Vascular;  Laterality: Left;  . EYE SURGERY Bilateral Aug. 17, 2015   Cataract  . FUDUCIAL PLACEMENT N/A 12/25/2014   Procedure: PLACEMENT OF FUDUCIAL;  Surgeon: Collene Gobble, MD;  Location: [facility];  Service: Thoracic;  Laterality:  N/A;  . IR GENERIC HISTORICAL  03/18/2016   IR US GUIDE VASC ACCESS LEFT 03/18/2016 Jacqulynn Cadet, MD [facility]-INTER[facility] RAD  . IR GENERIC HISTORICAL  03/18/2016   IR FLUORO GUIDE PORT INSERTION LEFT 03/18/2016 Jacqulynn Cadet, MD [facility]INTER[facility]  . PROSTATE SURGERY     Radiation Tx  X's 40  . TEE WITHOUT CARDIOVERSION N/A 10/31/2015   Procedure: TRANSESOPHAGEAL ECHOCARDIOGRAM (TEE);  Surgeon: Sueanne Margarita, MD;  Location: [facility] ENDOSCOPY;  Service: Cardiovascular;  Laterality: N/A;  . VASCULAR SURGERY    . VIDEO BRONCHOSCOPY WITH ENDOBRONCHIAL NAVIGATION N/A 12/25/2014   Procedure: VIDEO BRONCHOSCOPY WITH ENDOBRONCHIAL NAVIGATION  with Biopsies and Brushings;  Surgeon: Collene Gobble, MD;  Location: [place];  Service: Thoracic;  Laterality: N/A;  . VIDEO BRONCHOSCOPY WITH ENDOBRONCHIAL ULTRASOUND N/A 01/22/2015   Procedure: VIDEO BRONCHOSCOPY WITH ENDOBRONCHIAL ULTRASOUND with Biopsies;  Surgeon: Collene Gobble, MD;  Location: [place];  Service: Thoracic;  Laterality: N/A;    SOCIAL HISTORY: Social History   Social History  . Marital status: Widowed    Spouse name: N/A  . Number of children: 5  . Years of education: N/A   Occupational History  . Retired Airline pilot   . Part time, delivering auto parts [employer]   Social History Main Topics  . Smoking status: Current Every Day Smoker    Packs/day: 1.00    Years: 55.00    Types: Cigarettes  . Smokeless tobacco: Never Used     Comment: 1/2 ppd (01/07/15)  . Alcohol use 0.0 oz/week     Comment: occ  . Drug use: No  . Sexual activity: Not on file   Other Topics Concern  . Not on file   Social History Narrative   Widowed, lives next door to his step-daughter, Antony Haste   Independent in ADLs and drives   Prior worked delivering car parts    FAMILY HISTORY: Family History  Problem Relation Age of Onset  . Stroke Father   . Heart disease Father   . Heart disease Mother        pacemaker  . Other Brother        benign brain  tumor  . Cancer Brother        Eye-behind  . Cancer Sister        Breast  . Liver cancer Brother   . Diabetes Maternal Grandmother   . Colon polyps Neg Hx   . Esophageal cancer Neg Hx     ALLERGIES:  is allergic to augmentin [amoxicillin-pot clavulanate]; chantix [varenicline]; sulfonamide derivatives; and zantac [ranitidine hcl].  MEDICATIONS:  Current Facility-Administered Medications  Medication Dose Route Frequency Provider Last Rate Last Dose  . acetaminophen (TYLENOL) tablet 1,000 mg  1,000 mg Oral Q4H PRN Mody, Sital, MD      . albuterol (PROVENTIL) (2.5 MG/3ML) 0.083% nebulizer solution 2.5 mg  2.5 mg Nebulization Q6H PRN Hugelmeyer, Alexis, DO      . ALPRAZolam Duanne Moron) tablet 0.25-0.5 mg  0.25-0.5 mg Oral Daily PRN Hugelmeyer, Alexis, DO      .  bisacodyl (DULCOLAX) EC tablet 5 mg  5 mg Oral Daily PRN Hugelmeyer, Alexis, DO      . clopidogrel (PLAVIX) tablet 75 mg  75 mg Oral Daily Mody, Sital, MD   75 mg at 08/18/16 1640  . guaiFENesin (MUCINEX) 12 hr tablet 600 mg  600 mg Oral BID Hugelmeyer, Alexis, DO   600 mg at 08/18/16 0820   And  . dextromethorphan (DELSYM) 30 MG/5ML liquid 30 mg  30 mg Oral BID Hugelmeyer, Alexis, DO   30 mg at 08/18/16 1411  . diphenoxylate-atropine (LOMOTIL) 2.5-0.025 MG per tablet 1-2 tablet  1-2 tablet Oral QID PRN Hugelmeyer, Alexis, DO      . fludrocortisone (FLORINEF) tablet 0.1 mg  0.1 mg Oral Daily Hugelmeyer, Alexis, DO   0.1 mg at 08/18/16 1410  . heparin injection 5,000 Units  5,000 Units Subcutaneous Q8H Hugelmeyer, Alexis, DO   5,000 Units at 08/18/16 1526  . ipratropium (ATROVENT) nebulizer solution 0.5 mg  0.5 mg Nebulization Q6H PRN Hugelmeyer, Alexis, DO      . ipratropium-albuterol (DUONEB) 0.5-2.5 (3) MG/3ML nebulizer solution 3 mL  3 mL Nebulization Q4H PRN Hugelmeyer, Alexis, DO      . lidocaine (XYLOCAINE) 2 % viscous mouth solution 20 mL  20 mL Mouth/Throat PRN Hugelmeyer, Alexis, DO      . lidocaine-EPINEPHrine (XYLOCAINE W/EPI)  2 %-1:100000 (with pres) injection 40 mL  40 mL Infiltration Once Carrie Mew, MD      . lidocaine-prilocaine (EMLA) cream   Topical Once Hugelmeyer, Alexis, DO   Stopped at 08/17/16 2353  . loratadine (CLARITIN) tablet 10 mg  10 mg Oral Daily Hugelmeyer, Alexis, DO   10 mg at 08/18/16 1411  . magnesium citrate solution 1 Bottle  1 Bottle Oral Once PRN Hugelmeyer, Alexis, DO      . mirtazapine (REMERON) tablet 15 mg  15 mg Oral QHS Hugelmeyer, Alexis, DO   15 mg at 08/17/16 2353  . mometasone-formoterol (DULERA) 200-5 MCG/ACT inhaler 2 puff  2 puff Inhalation BID Hugelmeyer, Alexis, DO   2 puff at 08/18/16 0820  . multivitamin with minerals tablet 1 tablet  1 tablet Oral Daily Hugelmeyer, Alexis, DO   1 tablet at 08/18/16 1410  . nitroGLYCERIN (NITROSTAT) SL tablet 0.4 mg  0.4 mg Sublingual Q5 min PRN Hugelmeyer, Alexis, DO      . ondansetron (ZOFRAN) tablet 4 mg  4 mg Oral Q6H PRN Hugelmeyer, Alexis, DO       Or  . ondansetron (ZOFRAN) injection 4 mg  4 mg Intravenous Q6H PRN Hugelmeyer, Alexis, DO      . pantoprazole (PROTONIX) EC tablet 40 mg  40 mg Oral Daily Hugelmeyer, Alexis, DO   40 mg at 08/18/16 1411  . potassium chloride (KLOR-CON) packet 20 mEq  20 mEq Oral Once Bettey Costa, MD       Followed by  . [START ON 08/19/2016] potassium chloride (KLOR-CON) packet 20 mEq  20 mEq Oral Daily Mody, Sital, MD      . predniSONE (DELTASONE) tablet 10 mg  10 mg Oral QODAY Hugelmeyer, Alexis, DO   10 mg at 08/18/16 1410  . prochlorperazine (COMPAZINE) tablet 5 mg  5 mg Oral Q6H PRN Hugelmeyer, Alexis, DO      . senna-docusate (Senokot-S) tablet 1 tablet  1 tablet Oral QHS PRN Hugelmeyer, Alexis, DO      . tiotropium (SPIRIVA) inhalation capsule 18 mcg  18 mcg Inhalation Daily Hugelmeyer, Alexis, DO   18 mcg at 08/18/16 1412  .  traMADol (ULTRAM) tablet 50 mg  50 mg Oral Q6H PRN Hugelmeyer, Alexis, DO   50 mg at 08/18/16 1247      .  PHYSICAL EXAMINATION:  Vitals:   08/18/16 1136 08/18/16  1232  BP: (!) 157/75 (!) 171/88  Pulse: 77 74  Resp: 18   Temp:  97.7 F (36.5 C)   Filed Weights   08/17/16 1715 08/18/16 1539  Weight: 124 lb (56.2 kg) 120 lb 14.4 oz (54.8 kg)    GENERAL: Well-nourished well-developed; Alert, no distress and comfortable.   Alone. Patient using his spirometer. EYES: no pallor or icterus OROPHARYNX: no thrush or ulceration. Poor dentition. NECK: supple, no masses felt LYMPH:  no palpable lymphadenopathy in the cervical, axillary or inguinal regions LUNGS: decreased breath sounds to auscultation left more than right. Chest tube in place. HEART/CVS: regular rate & rhythm and no murmurs; No lower extremity edema ABDOMEN: abdomen soft, non-tender and normal bowel sounds Musculoskeletal:no cyanosis of digits and no clubbing  PSYCH: alert & oriented x 3 with fluent speech NEURO: no focal motor/sensory deficits SKIN:  no rashes or significant lesions  LABORATORY DATA:  I have reviewed the data as listed Lab Results  Component Value Date   WBC 8.5 08/18/2016   HGB 8.5 (L) 08/18/2016   HCT 26.3 (L) 08/18/2016   MCV 84.4 08/18/2016   PLT 378 08/18/2016    Recent Labs  08/07/16 1652 08/08/16 0514 08/11/16 1911 08/17/16 1725 08/18/16 0230  NA 135 134* 134* 132* 135  K 3.4* 3.7 3.4* 3.5 3.0*  CL 97* 102 98 97* 100*  CO2 26 27 29 27 28   GLUCOSE 109* 143* 110* 138* 113*  BUN 15 15 16 12 10   CREATININE 1.01 0.82 1.06 0.79 0.72  CALCIUM 8.5* 8.2* 8.6 8.7* 8.5*  GFRNONAA >60 >60  --  >60 >60  GFRAA >60 >60  --  >60 >60  PROT 6.3*  --  5.8* 6.3*  --   ALBUMIN 2.9*  --  3.2* 3.2*  --   AST 23  --  14 22  --   ALT 10*  --  10 13*  --   ALKPHOS 79  --  79 100  --   BILITOT 0.4  --  0.2 0.7  --     RADIOGRAPHIC STUDIES: I have personally reviewed the radiological images as listed and agreed with the findings in the report. Dg Chest 1 View  Result Date: 08/05/2016 CLINICAL DATA:  Spontaneous left pneumothorax. EXAM: CHEST 1 VIEW COMPARISON:   CT of the chest on 08/04/2016 FINDINGS: Small left apical pneumothorax present of approximately 5-10 percent volume. 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 Airspace disease present at the right lung base with associated small right pleural effusion. No edema. Stable tortuosity of the thoracic aorta. Stable positioning of Port-A-Cath. IMPRESSION: 5-10% left-sided pneumothorax. Right basilar pneumonia with small right pleural effusion. Electronically Signed   By: Aletta Edouard M.D.   On: 08/05/2016 08:57   Dg Chest 2 View  Result Date: 08/17/2016 CLINICAL DATA:  76 year old male with history of known pneumonia, bronchitis, and COPD presenting with shortness of breath x3 days. Patient has history of lung cancer and pneumothorax. EXAM: CHEST  2 VIEW COMPARISON:  Chest radiograph dated 08/07/2016 FINDINGS: Left pectoral Port-A-Cath with tip in stable positioning at cavoatrial junction. There has been interval increase in the size of the left-sided pneumothorax measuring approximately 28 mm from the pleural surface (previously 10 mm). There is a small right pleural  effusion with patchy area of density at the right lung base consistent with atelectasis versus infiltrate. This is progressed compared to prior radiograph. There is emphysematous changes of the lungs. Multiple surgical clips with adjacent subpleural postsurgical changes and scarring noted in the right upper lobe. There is stable cardiac size. The aorta is tortuous. There is osteopenia with degenerative changes of the spine. No acute osseous pathology noted. IMPRESSION: 1. Slight interval increase in the size of the left pneumothorax compared to the prior radiograph and CT. 2. Emphysema. 3. Small right pleural effusion and right lung base patchy opacity similar or slightly progressed compared to prior radiograph. 4. Postsurgical changes of right upper lobe. These results were called by telephone at the time of interpretation on 08/17/2016 at 6:48  pm to Dr. Joni Fears, who verbally acknowledged these results. Electronically Signed   By: Anner Crete M.D.   On: 08/17/2016 18:49   Dg Chest 2 View  Result Date: 08/07/2016 CLINICAL DATA:  Weakness, recent pneumonia and pneumothorax, history of lung cancer EXAM: CHEST  2 VIEW COMPARISON:  08/05/2016 FINDINGS: Stable small left apical pneumothorax. Volume loss in the right hemithorax. Fiducial markers in the right upper lung with adjacent scarring. Small right pleural effusion. Right basilar opacity, atelectasis versus pneumonia. The heart is normal in size. Left chest port terminates the cavoatrial junction. IMPRESSION: Stable small left apical pneumothorax. Right basilar opacity, atelectasis versus pneumonia. Small right pleural effusion. Electronically Signed   By: Julian Hy M.D.   On: 08/07/2016 17:42   Dg Lumbar Spine 2-3 Views  Result Date: 08/18/2016 CLINICAL DATA:  Mid to lower back pain and left hip pain since yesterday when being moved from 1 bed to another using a transfer board. EXAM: LUMBAR SPINE - 2-3 VIEW COMPARISON:  Coronal and sagittal CT images through the lumbar spine from an abdominal and pelvic CT scan of Jul 21, 2016 FINDINGS: The lumbar vertebral bodies are preserved in height. There is gentle curvature convex toward the right centered at L3. There is a stable sclerotic focus in the body of L3. The disc space heights are well maintained. There is no spondylolisthesis. There is mild facet joint hypertrophy at L5-S1. The pedicles and transverse processes are intact where visualized. The observed portions of the sacrum are grossly normal. There is calcification in the wall of the abdominal aorta with an infrarenal aneurysm demonstrated measuring up to 4.3 cm in diameter. IMPRESSION: No acute bony abnormality of the lumbar spine. Mild degenerative facet joint changes. Stable L3 sclerotic focus. Infrarenal abdominal aortic aneurysm measuring up to 4.3 cm in greatest AP dimension  stable since the CT scan. Electronically Signed   By: David  Martinique M.D.   On: 08/18/2016 14:05   Dg Pelvis 1-2 Views  Result Date: 08/18/2016 CLINICAL DATA:  76 year old male with low back pain radiating to the left hip since yesterday after transferring from 1 bed 2 another. Known Bone metastases including left iliac wing, left L2 transverse process. EXAM: PELVIS - 1-2 VIEW COMPARISON:  CT Abdomen and Pelvis 07/21/2016 and earlier FINDINGS: Supine AP view of the pelvis at 1338 hours. The destructive left iliac wing bone metastasis seen on 07/21/2016 is largely radiographically occult. No pelvic fracture identified. Sacrum appears grossly intact. Femoral heads are normally located. Hip joint spaces are preserved. Iliofemoral calcified atherosclerosis. Negative visible bowel gas pattern. IMPRESSION: Poor radiographic visualization of the known left iliac wing bone metastasis. No acute fracture or dislocation identified about the pelvis. Electronically Signed   By:  Genevie Ann M.D.   On: 08/18/2016 14:05   Ct Chest W Contrast  Result Date: 07/21/2016 CLINICAL DATA:  Non-small-cell lung carcinoma carcinoma. Esophageal carcinoma. Prostate carcinoma. Chronic cough. EXAM: CT CHEST, ABDOMEN, AND PELVIS WITH CONTRAST TECHNIQUE: Multidetector CT imaging of the chest, abdomen and pelvis was performed following the standard protocol during bolus administration of intravenous contrast. CONTRAST:  116m ISOVUE-300 IOPAMIDOL (ISOVUE-300) INJECTION 61% COMPARISON:  None. FINDINGS: CT CHEST FINDINGS Cardiovascular: Coronary artery calcification and aortic atherosclerotic calcification. Mediastinum/Nodes: No axillary or supraclavicular adenopathy. No mediastinal hilar adenopathy. No pericardial fluid. Esophagus normal. Lungs/Pleura: Peripheral RIGHT upper lobe nodule measures 9 mm decreased from 12 mm (image 53, series 4). RIGHT apical nodule measures 5 mm (image image 47, series 4) and unchanged. Within the LEFT upper lobe  subpleural nodule measuring 6 mm is unchanged. Medial LEFT upper lobe nodule measuring 6 mm (image 78, series 4) not changed. No new pulmonary nodules. Small bilateral pleural effusions similar prior. Small focus consolidation at the RIGHT lung base (image 148, series 4) is new. Musculoskeletal: Widespread lytic and sclerotic lesions throughout the spine are unchanged. Expansile medial LEFT first rib lesion is unchanged. CT ABDOMEN PELVIS FINDINGS Hepatobiliary: No focal hepatic lesion.  No biliary duct dilatation. Pancreas: Pancreas is normal. No ductal dilatation. No pancreatic inflammation. Spleen: Normal spleen Adrenals/Urinary Tract: Adrenal glands are normal. Large benign cyst of the LEFT kidney. No renal obstruction. Bladder normal per Stomach/Bowel: Small hiatal hernia. The duodenum small-bowel normal. Appendix normal. Moderate volume stool throughout the colon. No obstructing lesion within the colon. Rectum normal. Vascular/Lymphatic: Abdominal aortic aneurysmal below the renal arteries measures 44 mm similar 44 mm. Calcified gastrohepatic ligament lymph nodes measuring 18 mm is unchanged. No new lymphadenopathy. Reproductive: Prostate normal Other: RIGHT inguinal hernia a small of fluid and this a knuckle of small bowel extends into a proximal aspect of hernia. Musculoskeletal: Expansile lesion in the LEFT iliac wing with scattered calcifications measures 5.4 cm by 4.5 cm compared to 4.2 x 4.3 cm for some interval increase in size. Widespread lytic and sclerotic lesions in the spine pelvis are unchanged. Lesion extending from the LEFT transverse process of L2 (image 75, series 2) measures 18 mm and is new from prior. IMPRESSION: Chest Impression: 1. Majority of the pulmonary nodules are unchanged from comparison exam. 2. Nodule in the RIGHT upper lobe laterally is decreased in volume. 3. No new pulmonary nodules. 4. Stable lytic, sclerotic and expansile skeletal lesions within the thorax. Abdomen / Pelvis  Impression: 1. Stable lymphadenopathy in the gastrohepatic ligament. 2. Interval increase in soft tissue the component of bone metastasis within LEFT iliac wing and LEFT transverse process at L2. 3. Small RIGHT inguinal hernia contains a short segment of small bowel and fluid. 4. Stable infrarenal abdominal aortic aneurysm at 43 mm. Electronically Signed   By: SSuzy BouchardM.D.   On: 07/21/2016 12:38   Ct Angio Chest Pe W Or Wo Contrast  Result Date: 08/17/2016 CLINICAL DATA:  Chemotherapy ongoing. RIGHT pneumothorax. LEFT pneumothorax. EXAM: CT ANGIOGRAPHY CHEST WITH CONTRAST TECHNIQUE: Multidetector CT imaging of the chest was performed using the standard protocol during bolus administration of intravenous contrast. Multiplanar CT image reconstructions and MIPs were obtained to evaluate the vascular anatomy. CONTRAST:  Study 5 mL Isovue COMPARISON:  CT 08/04/2016, chest radiograph 08/17/2016, CT 04/27/2016 FINDINGS: Cardiovascular: Coronary artery calcification and aortic atherosclerotic calcification. Mediastinum/Nodes: No axillary or supraclavicular adenopathy. New mediastinal or hilar adenopathy. No pericardial fluid. No central pulmonary embolism. Lungs/Pleura: Moderate  LEFT pneumothorax has increased in volume from CT of 08/04/2016. Pneumothorax occupies approximately 30% of lung volume. Nodule within the LEFT upper lobe measures 6 mm (image 47, series 6) unchanged. Nodule in the RIGHT upper lobe measuring 8 mm (image 32, series 6) decreased from 12 mm on comparison CT of 04/27/2016. More inferior nodule measuring 4 mm is unchanged from 05/26/22. Nodular peripheral consolidation in the RIGHT lower lobe is improved. Small RIGHT effusion. Upper Abdomen: Limited view of the liver, kidneys, pancreas are unremarkable. Normal adrenal glands. Large LEFT renal cyst. Musculoskeletal: Expansile lesion in the anterior medial first rib is again demonstrated measuring 2.7 cm. Lesion within the T3 and T4 vertebral  bodies are increased from CT in 05/26/22. For example lesion at T3 on the LEFT measures 2.6 cm increased from 1.2 cm (image 15, series 4. Lesion at T4 on the RIGHT measures 2.2 cm increased from 0.8 cm. Initial sclerotic and lesions lytic lesion more inferiorly are similar. Review of the MIP images confirms the above findings. IMPRESSION: 1. Interval increase in volume of LEFT pneumothorax occupying 30% LEFT hemithorax volume. 2. Stable bilateral pulmonary nodules. 3. Interval increase in soft tissue component of lytic lesions at T3 and T4 compared to CT of favor 04/27/2016. 4. Improvement in consolidation in the RIGHT lower lobe. Critical Value/emergent results were called by telephone at the time of interpretation on 08/17/2016 at 8:08 pm to Dr. Carrie Mew , who verbally acknowledged these results. Electronically Signed   By: Suzy Bouchard M.D.   On: 08/17/2016 20:03   Ct Angio Chest Pe W Or Wo Contrast  Result Date: 08/04/2016 CLINICAL DATA:  Chest pain. Shortness of breath. History of esophageal cancer. EXAM: CT ANGIOGRAPHY CHEST WITH CONTRAST TECHNIQUE: Multidetector CT imaging of the chest was performed using the standard protocol during bolus administration of intravenous contrast. Multiplanar CT image reconstructions and MIPs were obtained to evaluate the vascular anatomy. CONTRAST:  75 mL of Isovue 370 intravenously. COMPARISON:  CT scan of Jul 21, 2016. FINDINGS: Cardiovascular: Atherosclerosis of thoracic aorta is noted without aneurysm or dissection. There is no definite evidence of pulmonary embolus. Coronary artery calcifications are noted. Normal cardiac size. No pericardial effusion is noted. Mediastinum/Nodes: No enlarged mediastinal, hilar, or axillary lymph nodes. Thyroid gland, trachea, and esophagus demonstrate no significant findings. Lungs/Pleura: Mild left pneumothorax is noted. Mild right pleural effusion is noted. Right lower lobe atelectasis or pneumonia is noted. Stable 8 mm  subpleural nodule is noted in left upper lobe. Stable 9 mm nodule is noted more medially in left upper lobe. Stable 4 mm right upper lobe nodule is noted. Stable density is seen laterally in right upper lobe most consistent with scarring. Bulla formation is noted in the right upper lobe. Upper Abdomen: Stable large left renal cyst. No other definite abnormality seen in visualized portion of upper abdomen. Musculoskeletal: Multiple sclerotic and lytic lesions are seen throughout the ribs and spine consistent with metastatic disease. Review of the MIP images confirms the above findings. IMPRESSION: Aortic atherosclerosis. Coronary artery calcifications are noted suggesting coronary artery disease. No definite evidence of pulmonary embolus. Stable bilateral pulmonary nodules are noted concerning for metastatic disease. Stable osseous metastases. Mild right pleural effusion is noted. Right lower lobe atelectasis or pneumonia is noted. Interval development of mild left pneumothorax which is less than 10%. Critical Value/emergent results were called by telephone at the time of interpretation on 08/04/2016 at 3:03 pm to Dr. Rudene Re , who verbally acknowledged these results. Electronically Signed  By: Marijo Conception, M.D.   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 Chronic cough. EXAM: CT CHEST, ABDOMEN, AND PELVIS WITH CONTRAST TECHNIQUE: Multidetector CT imaging of the chest, abdomen and pelvis was performed following the standard protocol during bolus administration of intravenous contrast. CONTRAST:  112m ISOVUE-300 IOPAMIDOL (ISOVUE-300) INJECTION 61% COMPARISON:  None. FINDINGS: CT CHEST FINDINGS Cardiovascular: Coronary artery calcification and aortic atherosclerotic calcification. Mediastinum/Nodes: No axillary or supraclavicular adenopathy. No mediastinal hilar  adenopathy. No pericardial fluid. Esophagus normal. Lungs/Pleura: Peripheral RIGHT upper lobe nodule measures 9 mm decreased from 12 mm (image 53, series 4). RIGHT apical nodule measures 5 mm (image image 47, series 4) and unchanged. Within the LEFT upper lobe subpleural nodule measuring 6 mm is unchanged. Medial LEFT upper lobe nodule measuring 6 mm (image 78, series 4) not changed. No new pulmonary nodules. Small bilateral pleural effusions similar prior. Small focus consolidation at the RIGHT lung base (image 148, series 4) is new. Musculoskeletal: Widespread lytic and sclerotic lesions throughout the spine are unchanged. Expansile medial LEFT first rib lesion is unchanged. CT ABDOMEN PELVIS FINDINGS Hepatobiliary: No focal hepatic lesion.  No biliary duct dilatation. Pancreas: Pancreas is normal. No ductal dilatation. No pancreatic inflammation. Spleen: Normal spleen Adrenals/Urinary Tract: Adrenal glands are normal. Large benign cyst of the LEFT kidney. No renal obstruction. Bladder normal per Stomach/Bowel: Small hiatal hernia. The duodenum small-bowel normal. Appendix normal. Moderate volume stool throughout the colon. No obstructing lesion within the colon. Rectum normal. Vascular/Lymphatic: Abdominal aortic aneurysmal below the renal arteries measures 44 mm similar 44 mm. Calcified gastrohepatic ligament lymph nodes measuring 18 mm is unchanged. No new lymphadenopathy. Reproductive: Prostate normal Other: RIGHT inguinal hernia a small of fluid and this a knuckle of small bowel extends into a proximal aspect of hernia. Musculoskeletal: Expansile lesion in the LEFT iliac wing with scattered calcifications measures 5.4 cm by 4.5 cm compared to 4.2 x 4.3 cm for some interval increase in size. Widespread lytic and sclerotic lesions in the spine pelvis are unchanged. Lesion extending from the LEFT transverse process of L2 (image 75, series 2) measures 18 mm and is new from prior. IMPRESSION: Chest Impression: 1.  Majority of the pulmonary nodules are unchanged from comparison exam. 2. Nodule in the RIGHT upper lobe laterally is decreased in volume. 3. No new pulmonary nodules. 4. Stable lytic, sclerotic and expansile skeletal lesions within the thorax. Abdomen / Pelvis Impression: 1. Stable lymphadenopathy in the gastrohepatic ligament. 2. Interval increase in soft tissue the component of bone metastasis within LEFT iliac wing and LEFT transverse process at L2. 3. Small RIGHT inguinal hernia contains a short segment of small bowel and fluid. 4. Stable infrarenal abdominal aortic aneurysm at 43 mm. Electronically Signed   By: SSuzy BouchardM.D.   On: 07/21/2016 12:38   Ct Image Guided Drainage By Percutaneous Catheter  Result Date: 08/18/2016 INDICATION: 76year old male with a history of enlarging left-sided pneumothorax. EXAM: CT-GUIDED PLACEMENT OF LEFT THORACOSTOMY TUBE MEDICATIONS: The patient is currently admitted to the hospital and receiving intravenous antibiotics. The antibiotics were administered within an appropriate time frame prior to the initiation of the procedure. ANESTHESIA/SEDATION: 0.5 mg IV Versed 50 mcg IV Fentanyl Moderate Sedation Time:  15 The patient was continuously monitored during the procedure by the interventional radiology nurse under my direct supervision. COMPLICATIONS: NONE TECHNIQUE: Informed written consent was obtained from the patient after  a thorough discussion of the procedural risks, benefits and alternatives. All questions were addressed. Maximal Sterile Barrier Technique was utilized including caps, mask, sterile gowns, sterile gloves, sterile drape, hand hygiene and skin antiseptic. A timeout was performed prior to the initiation of the procedure. PROCEDURE: Patient positioned supine position on the CT gantry table. Scout CT of the chest was performed for planning purposes. The left chest was then prepped and draped in the usual sterile fashion. Skin and subcutaneous tissues  were generously infiltrated 1% lidocaine for local anesthesia. A small stab incision was made with 11 blade scalpel, and then a 10 French drain was placed into the left chest cavity with trocar technique. Once the drain was in position at the apex and the drain was sutured in position, drain was attached to aspiration with water seal chamber. Final CT was performed. Patient tolerated the procedure well and remained hemodynamically stable throughout. No complications were encountered and no significant blood loss. FINDINGS: Initial CT re- demonstrates multiple bilateral pulmonary nodules, as well as skeletal lesions compatible with metastases. Lesions better characterized on prior CT 08/17/2016 and 07/21/2016. Unexpected finding is a small right-sided pneumothorax which has developed in the interval compared to the CT 08/17/2016. Status post thoracostomy tube placement there is improving left-sided pneumothorax. IMPRESSION: Status post left-sided thoracostomy tube placement for treatment of persisting/enlarging pneumothorax. New small right-sided pneumothorax, which has developed in the interval compared to the CT 08/17/2016. Patient remained hemodynamically unchanged at the completion of the procedure. Signed, Dulcy Fanny. Earleen Newport, DO [facility] Radiology Electronically Signed   By: Corrie Mckusick D.O.   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 CT scan shows slight increase in the left iliac soft tissue/ L2 transverse process soft tissue mass. Patient might benefit from radiation as an outpatient for pain control. Chemotherapy could be resumed as outpatient.   #  Left-sided pneumothorax- unclear etiology; status post chest tube placement. Symptomatically improvement. Defer further management to surgery/IR.  # Patient will follow-up with his primary oncologist Dr.Sherill at discharge.    Thank you Dr.Mody for allowing me to participate in the care of your pleasant patient. Please do not hesitate to contact me with questions or concerns in the interim.     Cammie Sickle, MD 08/18/2016 7:20 PM

## 2016-08-18 NOTE — Progress Notes (Signed)
Initial Nutrition Assessment  DOCUMENTATION CODES:   Severe malnutrition in context of chronic illness  INTERVENTION:  Provide Carnation Instant Breakfast in whole milk TID with meals, each supplement provides 280 kcal and 13 grams of protein.  Continue daily multivitamin with minerals.   Discussed recommendation for small, frequent meals to help meet calorie and protein needs. Encouraged patient to have nuts, milk (enjoys half-and-half), ice cream, and other high-calorie foods/beverages he enjoys.  NUTRITION DIAGNOSIS:   Malnutrition (Severe) related to chronic illness (metastatic esophageal cancer on chemotherapy) as evidenced by 26.8 percent weight loss over 5 months, severe depletion of body fat, moderate depletions of muscle mass, severe depletion of muscle mass.  GOAL:   Patient will meet greater than or equal to 90% of their needs  MONITOR:   PO intake, Supplement acceptance, Labs, Weight trends, I & O's  REASON FOR ASSESSMENT:   Malnutrition Screening Tool    ASSESSMENT:   76 year old male with PMHx of COPD, CAD, HTN, HLD, PVD, hx of MI, GERD, anxiety, hydradenitis, ischemic cardiomyopathy, hx stroke 11/24/2011, AAA, history of prostate cancer 2008 s/p XRT, esophageal cancer with metastases currently on chemotherapy (Taxol) who presented with dyspnea due to increasing pneumothorax.   -Pt s/p CT guided placement of left pigtail drain today.  Met with patient, his daughter, and his grandson at bedside. He is known to this RD from previous assessment just a couple weeks ago. Patient reports his appetite remains poor. Still tries to eat yogurt or cereal or [place] toast for breakfast. Lunch may be a TV dinner. Daughter prepares dinner for him, but daughter reports he is only finishing about 1/3 of his dinner meal now. Still tries to eat a pint of ice cream every evening, but daughter reports this does not always happen. He continues to not drink the Ensure Plus regularly. He  tried the [employer], but reports it was too gritty. He is willing to try the kind you stir into milk again because he doesn't think he got to try that last time before being discharged. Still not having any current issues with swallowing. Denies N/V or abdominal pain.  UBW 160 lbs. Patient was 160.7 lbs on 10/30/2015 and lost 43.1 lbs (26.8% body weight) over 5 months, which is significant for time frame (was 117.6 lbs on 04/20/2016). Patient gained some weight back up to 128.2 lbs on 07/13/2016. Has since lost 7.3 lbs (5.7% body weight) over the past month, which is significant for time frame.  Meal Completion: 50% of lunch today (approximateyl 434 kcal and 13 grams of protein)  Medications reviewed and include: Remeron, MVI daily, pantoprazole, potassium chloride 20 mEq daily, prednisone 10 mg every other day.  Labs reviewed: Potassium 3, Chloride 100.   Nutrition-Focused physical exam completed. Findings are severe fat depletion, moderate-severe muscle depletion, and moderate edema.   Diet Order:  Diet regular Room service appropriate? Yes; Fluid consistency: Thin  Skin:  Reviewed, no issues  Last BM:  PTA (08/16/2016 per chart)  Height:   Ht Readings from Last 1 Encounters:  08/17/16 '5\' 8"'$  (1.727 m)    Weight:   Wt Readings from Last 1 Encounters:  08/18/16 120 lb 14.4 oz (54.8 kg)    Ideal Body Weight:  70 kg  BMI:  Body mass index is 18.38 kg/m.  Estimated Nutritional Needs:   Kcal:  1635-1890 (MSJ x 1.3-1.5)  Protein:  70-82 grams (1.3-1.5 grams/kg)  Fluid:  1.6-1.9 L/day  EDUCATION NEEDS:   No  education needs identified at this time  Willey Blade, MS, RD, LDN Pager: [phone] After Hours Pager: [phone]

## 2016-08-18 NOTE — Progress Notes (Signed)
[place] [facility] [place] NAME: [name]    MR#:  [id number]  DATE OF BIRTH:  [birth year]  SUBJECTIVE:  Back pain from being pulled to bed yesterday SOB is stable Not feeling bad overall   REVIEW OF SYSTEMS:    Review of Systems  Constitutional: Negative for fever, chills weight loss HENT: Negative for ear pain, nosebleeds, congestion, facial swelling, rhinorrhea, neck pain, neck stiffness and ear discharge.   Respiratory: ++ cough, NO shortness of breath, wheezing  Cardiovascular: Negative for chest pain, palpitations and leg swelling.  Gastrointestinal: Negative for heartburn, abdominal pain, vomiting, diarrhea or consitpation Genitourinary: Negative for dysuria, urgency, frequency, hematuria Musculoskeletal: ++ forr back pain NO joint pain Neurological: Negative for dizziness, seizures, syncope, focal weakness,  numbness and headaches.  Hematological: Does not bruise/bleed easily.  Psychiatric/Behavioral: Negative for hallucinations, confusion, dysphoric mood    Tolerating Diet:yes      DRUG ALLERGIES:   Allergies  Allergen Reactions  . Augmentin [Amoxicillin-Pot Clavulanate] Diarrhea, Nausea And Vomiting and Other (See Comments)    Has patient had a PCN reaction causing immediate rash, facial/tongue/throat swelling, SOB or lightheadedness with hypotension: No Has patient had a PCN reaction causing severe rash involving mucus membranes or skin necrosis: No Has patient had a PCN reaction that required hospitalization No Has patient had a PCN reaction occurring within the last 10 years: Yes If all of the above answers are "NO", then may proceed with Cephalosporin use.   . Chantix [Varenicline] Other (See Comments)    Violent nightmares  . Sulfonamide Derivatives Other (See Comments)    Pt do not remember 8-30 md  . Zantac [Ranitidine Hcl] Diarrhea    VITALS:  Blood pressure (!) 157/91, pulse 77, temperature 97.5 F (36.4  C), temperature source Oral, resp. rate 19, height 5\' 8"  (1.727 m), weight 56.2 kg (124 lb), SpO2 97 %.  PHYSICAL EXAMINATION:  Constitutional: Appears well-developed and well-nourished. No distress. HENT: Normocephalic. Marland Kitchen Oropharynx is clear and moist.  Eyes: Conjunctivae and EOM are normal. PERRLA, no scleral icterus.  Neck: Normal ROM. Neck supple. No JVD. No tracheal deviation. CVS: RRR, S1/S2 +, no murmurs, no gallops, no carotid bruit.  Pulmonary: b/l, rhonchi NO  wheezes, rales. Good air movement both lungs Abdominal: Soft. BS +,  no distension, tenderness, rebound or guarding.  Musculoskeletal: Normal range of motion. No edema and no tenderness.  Neuro: Alert. CN 2-12 grossly intact. No focal deficits. Skin: Skin is warm and dry. No rash noted. PORT placed Psychiatric: Normal mood and affect.      LABORATORY PANEL:   CBC  Recent Labs Lab 08/18/16 0230  WBC 8.5  HGB 8.5*  HCT 26.3*  PLT 378   ------------------------------------------------------------------------------------------------------------------  Chemistries   Recent Labs Lab 08/11/16 1911 08/17/16 1725 08/18/16 0230  NA 134* 132* 135  K 3.4* 3.5 3.0*  CL 98 97* 100*  CO2 29 27 28   GLUCOSE 110* 138* 113*  BUN 16 12 10   CREATININE 1.06 0.79 0.72  CALCIUM 8.6 8.7* 8.5*  MG 1.8  --   --   AST 14 22  --   ALT 10 13*  --   ALKPHOS 79 100  --   BILITOT 0.2 0.7  --    ------------------------------------------------------------------------------------------------------------------  Cardiac Enzymes  Recent Labs Lab 08/17/16 1725 08/18/16 0230  TROPONINI <0.03 <0.03   ------------------------------------------------------------------------------------------------------------------  RADIOLOGY:  Dg Chest 2 View  Result Date: 08/17/2016 CLINICAL DATA:  76 year old male with history  of known pneumonia, bronchitis, and COPD presenting with shortness of breath x3 days. Patient has history of  lung cancer and pneumothorax. EXAM: CHEST  2 VIEW COMPARISON:  Chest radiograph dated 08/07/2016 FINDINGS: Left pectoral Port-A-Cath with tip in stable positioning at cavoatrial junction. There has been interval increase in the size of the left-sided pneumothorax measuring approximately 28 mm from the pleural surface (previously 10 mm). There is a small right pleural effusion with patchy area of density at the right lung base consistent with atelectasis versus infiltrate. This is progressed compared to prior radiograph. There is emphysematous changes of the lungs. Multiple surgical clips with adjacent subpleural postsurgical changes and scarring noted in the right upper lobe. There is stable cardiac size. The aorta is tortuous. There is osteopenia with degenerative changes of the spine. No acute osseous pathology noted. IMPRESSION: 1. Slight interval increase in the size of the left pneumothorax compared to the prior radiograph and CT. 2. Emphysema. 3. Small right pleural effusion and right lung base patchy opacity similar or slightly progressed compared to prior radiograph. 4. Postsurgical changes of right upper lobe. These results were called by telephone at the time of interpretation on 08/17/2016 at 6:48 pm to Dr. Joni Fears, who verbally acknowledged these results. Electronically Signed   By: Anner Crete M.D.   On: 08/17/2016 18:49   Ct Angio Chest Pe W Or Wo Contrast  Result Date: 08/17/2016 CLINICAL DATA:  Chemotherapy ongoing. RIGHT pneumothorax. LEFT pneumothorax. EXAM: CT ANGIOGRAPHY CHEST WITH CONTRAST TECHNIQUE: Multidetector CT imaging of the chest was performed using the standard protocol during bolus administration of intravenous contrast. Multiplanar CT image reconstructions and MIPs were obtained to evaluate the vascular anatomy. CONTRAST:  Study 5 mL Isovue COMPARISON:  CT 08/04/2016, chest radiograph 08/17/2016, CT 04/27/2016 FINDINGS: Cardiovascular: Coronary artery calcification and aortic  atherosclerotic calcification. Mediastinum/Nodes: No axillary or supraclavicular adenopathy. New mediastinal or hilar adenopathy. No pericardial fluid. No central pulmonary embolism. Lungs/Pleura: Moderate LEFT pneumothorax has increased in volume from CT of 08/04/2016. Pneumothorax occupies approximately 30% of lung volume. Nodule within the LEFT upper lobe measures 6 mm (image 47, series 6) unchanged. Nodule in the RIGHT upper lobe measuring 8 mm (image 32, series 6) decreased from 12 mm on comparison CT of 04/27/2016. More inferior nodule measuring 4 mm is unchanged from 05-13-2022. Nodular peripheral consolidation in the RIGHT lower lobe is improved. Small RIGHT effusion. Upper Abdomen: Limited view of the liver, kidneys, pancreas are unremarkable. Normal adrenal glands. Large LEFT renal cyst. Musculoskeletal: Expansile lesion in the anterior medial first rib is again demonstrated measuring 2.7 cm. Lesion within the T3 and T4 vertebral bodies are increased from CT in 05-13-22. For example lesion at T3 on the LEFT measures 2.6 cm increased from 1.2 cm (image 15, series 4. Lesion at T4 on the RIGHT measures 2.2 cm increased from 0.8 cm. Initial sclerotic and lesions lytic lesion more inferiorly are similar. Review of the MIP images confirms the above findings. IMPRESSION: 1. Interval increase in volume of LEFT pneumothorax occupying 30% LEFT hemithorax volume. 2. Stable bilateral pulmonary nodules. 3. Interval increase in soft tissue component of lytic lesions at T3 and T4 compared to CT of favor 04/27/2016. 4. Improvement in consolidation in the RIGHT lower lobe. Critical Value/emergent results were called by telephone at the time of interpretation on 08/17/2016 at 8:08 pm to Dr. Carrie Mew , who verbally acknowledged these results. Electronically Signed   By: Suzy Bouchard M.D.   On: 08/17/2016 20:03  ASSESSMENT AND PLAN:   76 year old male with history of esophageal cancer, previous pneumothorax  and COPD who presented dyspnea due to increasing pneumothorax.  1. Pneumothorax which has increased in size from previous: Case discussed with Dr. Genevive Bi Plan for CT-guided drainage by percutaneous catheter  2. Back pain: CT is demonstrating interval increase in soft tissue component of lytic lesions at T3 and T4 Patient will need physical therapy consultation and pain management.  3. Abnormal EKG with T-wave inversions in inferior lateral leads: EKG pending for this a.m.   4. Hypokalemia: Replete and recheck in a.m.  5. Anemia of chronic disease: Continue to monitor CBC  6. History of CAD: Holding Plavix for procedures stated above  7. History of COPD without exacerbation  8  History of esophageal cancer: Consult oncology.   9. Hyponatremia: This is resolved  Management plans discussed with the patient and he is in agreement.  CODE STATUS: FULL  TOTAL TIME TAKING CARE OF THIS PATIENT: 30 minutes.     POSSIBLE D/C 2-3 days, DEPENDING ON CLINICAL CONDITION.   Wilfrid Hyser M.D on 08/18/2016 at 8:11 AM  Between 7am to 6pm - Pager - [phone] After 6pm go to [email or web address] - password [email or web address] [facility]  Office  [phone]  CC: Primary care physician; Tower, Wynelle Fanny, MD  Note: This dictation was prepared with Dragon dictation along with smaller phrase technology. Any transcriptional errors that result from this process are unintentional.

## 2016-08-18 NOTE — Consult Note (Signed)
Patient ID: [name], male   DOB: June 18, 1940, 76 y.o.   MRN: [id number]  Chief Complaint  Patient presents with  . Shortness of Breath    Referred By Dr. Benjie Karvonen Reason for Referral Left pneumothorax  HPI Location, Quality, Duration, Severity, Timing, Context, Modifying Factors, Associated Signs and Symptoms.  [name] is a 76 y.o. male.  This is a 76 year old white male with a history of lung cancer, esophageal cancer and prostate cancer with widespread metastatic disease. He presented 2 weeks ago with shortness of breath and had a workup which involved a CT scan showing a small stable left pneumothorax. Over the course of several days he was treated and ultimately discharged home but presented again yesterday with increasing shortness of breath and a CT scan showing an enlarging left pneumothorax. The CT scan also shows multiple bony metastases as well as intraparenchymal metastases. The patient was admitted to the hospital. He states that he is currently short of breath with exertion. Any physical activity does cause him increasing shortness of breath which is new over the last several months. He still smokes at least a half a pack cigarettes a day. He states he is trying to cut back on that. I did discuss his care today with Dr. Lisbeth Renshaw and with Dr. Damita Dunnings in interventional radiology. The patient is on Plavix for his coronary artery disease. He has a Port-A-Cath in the anterior lens chest wall. This makes an anterior approach to chest tube insertion somewhat difficult.   Past Medical History:  Diagnosis Date  . AAA (abdominal aortic aneurysm) (HCC)    4.2 cm IR AAA noted on 01/13/15 PET scan  . Anxiety   . CAD (coronary artery disease)    AMI 1997 w/ BMS x 2 LAD, BMS CFX 2002, DES x 2 LAD 2009, DES CFX & OM 2012   . Carotid artery disease ([place])    RICA CEA 3664, LICA CEA 4034 per Dr. [name],   . COPD (chronic obstructive pulmonary disease) ([place])   . GERD (gastroesophageal  reflux disease)    occ  . HTN (hypertension)   . Hydradenitis   . Hyperlipidemia   . Ischemic cardiomyopathy    EF 40-45 percent at cath 2012  . Myocardial infarction ([place])   . Non-small cell lung cancer (NSCLC) ([place]) dx'd 12/2014   SBRT  . Osteoarthritis    pt. denies 01/17/2015  . PAC (premature atrial contraction)   . Peripheral vascular disease ([place])   . Pneumonia   . Prostate cancer ([place]) dx'd approx 2008   xrt throughfoley  . Radiation 02/18/15-02/25/15   right upper lung 54 Gy  . Shortness of breath dyspnea   . Stroke [facility] 11-24-11   "no feeling in right 5th digit"  . Urinary frequency   . Urinary urgency     Past Surgical History:  Procedure Laterality Date  . CARDIAC CATHETERIZATION    . CARDIOVASCULAR STRESS TEST  12-2006  . CARDIOVERSION N/A 10/31/2015   Procedure: CARDIOVERSION;  Surgeon: Sueanne Margarita, MD;  Location: [facility];  Service: Cardiovascular;  Laterality: N/A;  . CAROTID ENDARTERECTOMY Right 03-2000   CE  . CAROTID ENDARTERECTOMY Left 11-24-11   CE  . COLONOSCOPY  04-2001   polyp  . [place], 2002, 2009, 2012   8 stents total  . [place]   neg  . ENDARTERECTOMY  11/24/2011   Procedure: ENDARTERECTOMY CAROTID;  Surgeon: Rosetta Posner, MD;  [facility];  Service: Vascular;  Laterality: Left;  . EYE SURGERY Bilateral Aug. 17, 2015   Cataract  . FUDUCIAL PLACEMENT N/A 12/25/2014   Procedure: PLACEMENT OF FUDUCIAL;  Surgeon: Collene Gobble, MD;  Location: [facility];  Service: Thoracic;  Laterality: N/A;  . IR GENERIC HISTORICAL  03/18/2016   IR US GUIDE VASC ACCESS LEFT 03/18/2016 Jacqulynn Cadet, MD [facility]-INTERV RAD  . IR GENERIC HISTORICAL  03/18/2016   IR FLUORO GUIDE PORT INSERTION LEFT 03/18/2016 Jacqulynn Cadet, MD [facility]-INTER[facility] RAD  . PROSTATE SURGERY     Radiation Tx  X's 40  . TEE WITHOUT CARDIOVERSION N/A 10/31/2015   Procedure: TRANSESOPHAGEAL ECHOCARDIOGRAM (TEE);  Surgeon: Sueanne Margarita, MD;   Location: [facility] ENDOSCOPY;  Service: Cardiovascular;  Laterality: N/A;  . VASCULAR SURGERY    . VIDEO BRONCHOSCOPY WITH ENDOBRONCHIAL NAVIGATION N/A 12/25/2014   Procedure: VIDEO BRONCHOSCOPY WITH ENDOBRONCHIAL NAVIGATION  with Biopsies and Brushings;  Surgeon: Collene Gobble, MD;  Location: [place];  Service: Thoracic;  Laterality: N/A;  . VIDEO BRONCHOSCOPY WITH ENDOBRONCHIAL ULTRASOUND N/A 01/22/2015   Procedure: VIDEO BRONCHOSCOPY WITH ENDOBRONCHIAL ULTRASOUND with Biopsies;  Surgeon: Collene Gobble, MD;  [facility];  Service: Thoracic;  Laterality: N/A;    Family History  Problem Relation Age of Onset  . Stroke Father   . Heart disease Father   . Heart disease Mother        pacemaker  . Other Brother        benign brain tumor  . Cancer Brother        Eye-behind  . Cancer Sister        Breast  . Liver cancer Brother   . Diabetes Maternal Grandmother   . Colon polyps Neg Hx   . Esophageal cancer Neg Hx     Social History Social History  Substance Use Topics  . Smoking status: Current Every Day Smoker    Packs/day: 1.00    Years: 55.00    Types: Cigarettes  . Smokeless tobacco: Never Used     Comment: 1/2 ppd (01/07/15)  . Alcohol use 0.0 oz/week     Comment: occ    Allergies  Allergen Reactions  . Augmentin [Amoxicillin-Pot Clavulanate] Diarrhea, Nausea And Vomiting and Other (See Comments)    Has patient had a PCN reaction causing immediate rash, facial/tongue/throat swelling, SOB or lightheadedness with hypotension: No Has patient had a PCN reaction causing severe rash involving mucus membranes or skin necrosis: No Has patient had a PCN reaction that required hospitalization No Has patient had a PCN reaction occurring within the last 10 years: Yes If all of the above answers are "NO", then may proceed with Cephalosporin use.   . Chantix [Varenicline] Other (See Comments)    Violent nightmares  . Sulfonamide Derivatives Other (See Comments)    Pt do not remember  8-30 md  . Zantac [Ranitidine Hcl] Diarrhea    Current Facility-Administered Medications  Medication Dose Route Frequency Provider Last Rate Last Dose  . acetaminophen (TYLENOL) tablet 1,000 mg  1,000 mg Oral Q4H PRN Hugelmeyer, Alexis, DO      . albuterol (PROVENTIL) (2.5 MG/3ML) 0.083% nebulizer solution 2.5 mg  2.5 mg Nebulization Q6H PRN Hugelmeyer, Alexis, DO      . ALPRAZolam Duanne Moron) tablet 0.25-0.5 mg  0.25-0.5 mg Oral Daily PRN Hugelmeyer, Alexis, DO      . bisacodyl (DULCOLAX) EC tablet 5 mg  5 mg Oral Daily PRN Hugelmeyer, Alexis, DO      .  guaiFENesin (MUCINEX) 12 hr tablet 600 mg  600 mg Oral BID Hugelmeyer, Alexis, DO       And  . dextromethorphan (DELSYM) 30 MG/5ML liquid 30 mg  30 mg Oral BID Hugelmeyer, Alexis, DO      . diphenoxylate-atropine (LOMOTIL) 2.5-0.025 MG per tablet 1-2 tablet  1-2 tablet Oral QID PRN Hugelmeyer, Alexis, DO      . fludrocortisone (FLORINEF) tablet 0.1 mg  0.1 mg Oral Daily Hugelmeyer, Alexis, DO      . heparin injection 5,000 Units  5,000 Units Subcutaneous Q8H Hugelmeyer, Alexis, DO   Stopped at 08/18/16 0350  . ipratropium (ATROVENT) nebulizer solution 0.5 mg  0.5 mg Nebulization Q6H PRN Hugelmeyer, Alexis, DO      . ipratropium-albuterol (DUONEB) 0.5-2.5 (3) MG/3ML nebulizer solution 3 mL  3 mL Nebulization Q4H PRN Hugelmeyer, Alexis, DO      . lidocaine (XYLOCAINE) 2 % viscous mouth solution 20 mL  20 mL Mouth/Throat PRN Hugelmeyer, Alexis, DO      . lidocaine-EPINEPHrine (XYLOCAINE W/EPI) 2 %-1:100000 (with pres) injection 40 mL  40 mL Infiltration Once Carrie Mew, MD      . lidocaine-prilocaine (EMLA) cream   Topical Once Hugelmeyer, Alexis, DO   Stopped at 08/17/16 2353  . loratadine (CLARITIN) tablet 10 mg  10 mg Oral Daily Hugelmeyer, Alexis, DO      . magnesium citrate solution 1 Bottle  1 Bottle Oral Once PRN Hugelmeyer, Alexis, DO      . mirtazapine (REMERON) tablet 15 mg  15 mg Oral QHS Hugelmeyer, Alexis, DO   15 mg at 08/17/16  2353  . mometasone-formoterol (DULERA) 200-5 MCG/ACT inhaler 2 puff  2 puff Inhalation BID Hugelmeyer, Alexis, DO   Stopped at 08/17/16 2353  . multivitamin with minerals tablet 1 tablet  1 tablet Oral Daily Hugelmeyer, Alexis, DO      . nitroGLYCERIN (NITROSTAT) SL tablet 0.4 mg  0.4 mg Sublingual Q5 min PRN Hugelmeyer, Alexis, DO      . ondansetron (ZOFRAN) tablet 4 mg  4 mg Oral Q6H PRN Hugelmeyer, Alexis, DO       Or  . ondansetron (ZOFRAN) injection 4 mg  4 mg Intravenous Q6H PRN Hugelmeyer, Alexis, DO      . oxyCODONE (Oxy IR/ROXICODONE) immediate release tablet 5 mg  5 mg Oral Q4H PRN Hugelmeyer, Alexis, DO      . pantoprazole (PROTONIX) EC tablet 40 mg  40 mg Oral Daily Hugelmeyer, Alexis, DO      . potassium chloride (KLOR-CON) packet 20 mEq  20 mEq Oral Daily Hugelmeyer, Alexis, DO      . predniSONE (DELTASONE) tablet 10 mg  10 mg Oral QODAY Hugelmeyer, Alexis, DO      . prochlorperazine (COMPAZINE) tablet 5 mg  5 mg Oral Q6H PRN Hugelmeyer, Alexis, DO      . senna-docusate (Senokot-S) tablet 1 tablet  1 tablet Oral QHS PRN Hugelmeyer, Alexis, DO      . tiotropium (SPIRIVA) inhalation capsule 18 mcg  18 mcg Inhalation Daily Hugelmeyer, Alexis, DO      . traMADol (ULTRAM) tablet 50 mg  50 mg Oral Q6H PRN Hugelmeyer, Alexis, DO   50 mg at 08/17/16 2353      Review of Systems A complete review of systems was asked and was negative except for the following positive findingsShortness of breath  Blood pressure (!) 157/91, pulse 77, temperature 97.5 F (36.4 C), temperature source Oral, resp. rate 19, height 5\' 8"  (1.727 m), weight  124 lb (56.2 kg), SpO2 97 %.  Physical Exam CONSTITUTIONAL:  Pleasant, well-developed, well-nourished, and in no acute distress. RESPIRATORY:  Lungs were clear anteriorly on the right but markedly diminished on the left.  Normal respiratory effort without pathologic use of accessory muscles of respiration CARDIOVASCULAR: Heart was regular without murmurs.   There were no carotid bruits. GI: The abdomen was soft, nontender, and nondistended. There were no palpable masses. There was no hepatosplenomegaly. There were normal bowel sounds in all quadrants. GU:  Rectal deferred.   MUSCULOSKELETAL:  Normal muscle strength and tone.  No clubbing or cyanosis.   SKIN:  There were no pathologic skin lesions.  There were no nodules on palpation. PSYCH:  Oriented to person, place and time.  Mood and affect are normal.  Data Reviewed CT scans and chest x-rays  I have personally reviewed the patient's imaging, laboratory findings and medical records.    Assessment    I have reviewed the most recent CT scan. This does show a moderate sized left pneumothorax in the presence of underlying COPD and lung metastases.    Plan    I have discussed his care with Dr. Lisbeth Renshaw and Dr. Earleen Newport. I believe that given the size of the pneumothorax and his symptoms that this should probably be drained. He would like that done here. Dr. Earleen Newport is agreeable to placing a percutaneous pigtail catheter. We'll go ahead and arrange for that. I did explain this to the patient and he is in agreement.         Nestor Lewandowsky, MD 08/18/2016, 8:09 AM

## 2016-08-18 NOTE — Procedures (Signed)
Interventional Radiology Procedure Note  Procedure: CT guided placement of left 51F pigtail drain for treatment of left pneumothorax.  Complications: None Recommendations:  - To suction via atrium drainage container - Routine wound care  Signed,  Dulcy Fanny. Earleen Newport, DO

## 2016-08-19 ENCOUNTER — Telehealth: Payer: Self-pay | Admitting: *Deleted

## 2016-08-19 ENCOUNTER — Inpatient Hospital Stay: Payer: Medicare Other

## 2016-08-19 LAB — BASIC METABOLIC PANEL
ANION GAP: 6 (ref 5–15)
BUN: 11 mg/dL (ref 6–20)
CO2: 29 mmol/L (ref 22–32)
Calcium: 8.7 mg/dL — ABNORMAL LOW (ref 8.9–10.3)
Chloride: 100 mmol/L — ABNORMAL LOW (ref 101–111)
Creatinine, Ser: 0.65 mg/dL (ref 0.61–1.24)
GFR calc Af Amer: >60 mL/min (ref >60)
GLUCOSE: 95 mg/dL (ref 65–99)
POTASSIUM: 3.9 mmol/L (ref 3.5–5.1)
Sodium: 135 mmol/L (ref 135–145)

## 2016-08-19 LAB — MAGNESIUM: Magnesium: 1.8 mg/dL (ref 1.7–2.4)

## 2016-08-19 LAB — CBC
HEMATOCRIT: 28.6 % — AB (ref 40.0–52.0)
Hemoglobin: 9.2 g/dL — ABNORMAL LOW (ref 13.0–18.0)
MCH: 27.2 pg (ref 26.0–34.0)
MCHC: 32.2 g/dL (ref 32.0–36.0)
MCV: 84.5 fL (ref 80.0–100.0)
Platelets: 369 10*3/uL (ref 150–440)
RBC: 3.38 MIL/uL — AB (ref 4.40–5.90)
RDW: 23.5 % — ABNORMAL HIGH (ref 11.5–14.5)
WBC: 9.7 10*3/uL (ref 3.8–10.6)

## 2016-08-19 LAB — PHOSPHORUS: Phosphorus: 3.5 mg/dL (ref 2.5–4.6)

## 2016-08-19 NOTE — Progress Notes (Signed)
  Patient ID: [name], male   DOB: 02-09-1941, 76 y.o.   MRN: [id number]  HISTORY: There were no new complaints today. Although he's not been very active since his left-sided chest tube was placed he does not complain of any significant shortness of breath. He had no significant discomfort nor did he have any fevers or chills.   Vitals:   08/19/16 0808 08/19/16 1245  BP: (!) 157/86 (!) 152/85  Pulse: 80 78  Resp: 16 16  Temp: 97.9 F (36.6 C) 97.7 F (36.5 C)     EXAM:    Resp: Lungs are Very distant bilaterally.  No respiratory distress, normal effort. Heart:  Regular without murmurs Abd:  Abdomen is soft, non distended and non tender. No masses are palpable.  There is no rebound and no guarding.  Neurological: Alert and oriented to person, place, and time. Coordination normal.  Skin: Skin is warm and dry. No rash noted. No diaphoretic. No erythema. No pallor.  Psychiatric: Normal mood and affect. Normal behavior. Judgment and thought content normal.    ASSESSMENT: I have independently reviewed the patient's chest x-ray from today. There is a small right-sided pneumothorax although the left side is now drained with a pleural catheter and I do not appreciate a left-sided pneumothorax except for the very apex.  There is no air leak today.   PLAN:   I did discuss his care at this primary care doctor today. We will obtain another chest x-ray tomorrow morning. Assuming that looks okay and there is no air leak we will remove the left-sided chest tube. We'll also get another x-ray to document the progression or lack thereof regarding the right-sided pneumothorax.    Nestor Lewandowsky, MD

## 2016-08-19 NOTE — Evaluation (Addendum)
Physical Therapy Evaluation Patient Details Name: [name] MRN: [id number] DOB: 06-10-1940 Today's Date: 08/19/2016   History of Present Illness  Pt admitted for L pneumothorax requiring chest tube placement on 08/18/16. Pt with complaints of SOB symptoms. Of note, previous admission for Pneumothorax; initially measured conservately, however nonresolution at this time. History includes CAD, COPD, cardiomyopathy, esophageal cancer with mets to abdomen, thoracic/lumbar spine and is currently receiving chemo.   Clinical Impression  Pt is a pleasant 76 year old male who was admitted for L pneumothorax with chest tube present. Pt performs bed mobility, transfers, and ambulation with min assist and +2 for line management and safety. Ambulation distance limited this date secondary to dizziness symptoms with OOB mobility. Noted R foot drop present. Per patient, has been affecting him since beginning chemo. Noted toe drag, reports he doesn't have brace. Pt demonstrates deficits with strengtht/pain/mobility. Would benefit from skilled PT to address above deficits and promote optimal return to PLOF; recommend transition to STR upon discharge from acute hospitalization.       Follow Up Recommendations SNF    Equipment Recommendations  Rolling walker with 5" wheels    Recommendations for Other Services       Precautions / Restrictions Precautions Precautions: Fall Restrictions Weight Bearing Restrictions: No      Mobility  Bed Mobility Overal bed mobility: Needs Assistance Bed Mobility: Sit to Supine       Sit to supine: Min assist   General bed mobility comments: needs assist for sliding B LEs back  into bed along with min assist +2 for scooting towards EOB. +2 required for lines/leads  Transfers Overall transfer level: Needs assistance Equipment used: Rolling walker (2 wheeled) Transfers: Sit to/from Stand Sit to Stand: Min assist         General transfer comment: needs  two attempts to stand secondary to unsteadiness and pain. +2 for assist with post leaning noted.   Ambulation/Gait Ambulation/Gait assistance: Min assist;+2 safety/equipment Ambulation Distance (Feet): 5 Feet Assistive device: Rolling walker (2 wheeled) Gait Pattern/deviations: Step-to pattern     General Gait Details: antalgic gait pattern performed with pt only able to ambulate from chair->bed. +2 required for line management. During ambulation, pt becomes dizzy and is unable to progress further gait distance.  Stairs            Wheelchair Mobility    Modified Rankin (Stroke Patients Only)       Balance Overall balance assessment: History of Falls;Needs assistance Sitting-balance support: Feet supported Sitting balance-Leahy Scale: Good     Standing balance support: Bilateral upper extremity supported Standing balance-Leahy Scale: Fair                               Pertinent Vitals/Pain Pain Assessment: Faces Faces Pain Scale: Hurts whole lot Pain Location: L chest Pain Descriptors / Indicators: Discomfort;Operative site guarding Pain Intervention(s): Limited activity within patient's tolerance;Repositioned    Home Living Family/patient expects to be discharged to:: Private residence Living Arrangements: Alone Available Help at Discharge: Family;Available PRN/intermittently Type of Home: House Home Access: Stairs to enter Entrance Stairs-Rails: Can reach both Entrance Stairs-Number of Steps: 5 Home Layout: One level Home Equipment: Cane - single point Additional Comments: Daughter lives within walking distance of patient and provides frequent check in/assist as needed    Prior Function Level of Independence: Independent with assistive device(s)         Comments: Pt reports previously  he was ambulatory with SPC. Prior to most recent admission, pt reports he was fully independent     Hand Dominance   Dominant Hand: Right     Extremity/Trunk Assessment   Upper Extremity Assessment Upper Extremity Assessment: Overall WFL for tasks assessed    Lower Extremity Assessment Lower Extremity Assessment: Generalized weakness (B LE grossly 4/5; did not perform formal MMT due to mets )       Communication   Communication: No difficulties  Cognition Arousal/Alertness: Lethargic Behavior During Therapy: WFL for tasks assessed/performed Overall Cognitive Status: Within Functional Limits for tasks assessed                                        General Comments      Exercises Other Exercises Other Exercises: Pt able to perform 5 reps of B LE LAQ, further ther-ex deferred secondary to lethargy and dizziness.   Assessment/Plan    PT Assessment Patient needs continued PT services  PT Problem List Decreased strength;Decreased activity tolerance;Decreased balance;Decreased mobility       PT Treatment Interventions Gait training;Therapeutic activities;Therapeutic exercise;Balance training    PT Goals (Current goals can be found in the Care Plan section)  Acute Rehab PT Goals Patient Stated Goal: to feel less pain PT Goal Formulation: With patient Time For Goal Achievement: 09/02/16 Potential to Achieve Goals: Fair    Frequency Min 2X/week   Barriers to discharge Decreased caregiver support;Inaccessible home environment      Co-evaluation               AM-PAC PT "6 Clicks" Daily Activity  Outcome Measure Difficulty turning over in bed (including adjusting bedclothes, sheets and blankets)?: A Little Difficulty moving from lying on back to sitting on the side of the bed? : Total Difficulty sitting down on and standing up from a chair with arms (e.g., wheelchair, bedside commode, etc,.)?: Total Help needed moving to and from a bed to chair (including a wheelchair)?: A Little Help needed walking in hospital room?: A Lot Help needed climbing 3-5 steps with a railing? : A Lot 6 Click  Score: 12    End of Session   Activity Tolerance: Patient limited by lethargy Patient left: in bed;with bed alarm set Nurse Communication: Mobility status PT Visit Diagnosis: Unsteadiness on feet (R26.81);Muscle weakness (generalized) (M62.81);History of falling (Z91.81);Difficulty in walking, not elsewhere classified (R26.2)    Time: 1134-1150 PT Time Calculation (min) (ACUTE ONLY): 16 min   Charges:   PT Evaluation $PT Eval Moderate Complexity: 1 Procedure     PT G Codes:        Greggory Stallion, PT, DPT [phone]   Ruhi Kopke 08/19/2016, 2:11 PM

## 2016-08-19 NOTE — Telephone Encounter (Signed)
Received call from daughter asking about FMLA.  Called Leslie back & she states that her FMLA papers have expired & wants to know if Dr Benay Spice will sign new ones. Informed that she should bring them in & [facility] will get them ready for Dr Benay Spice.

## 2016-08-19 NOTE — Progress Notes (Signed)
[facility] NAME: [name]    MR#:  [id number]  DATE OF BIRTH:  03/19/1941  SUBJECTIVE:  OOB to chair today, SOB better   REVIEW OF SYSTEMS:    Review of Systems  Constitutional: Negative for fever, chills weight loss HENT: Negative for ear pain, nosebleeds, congestion, facial swelling, rhinorrhea, neck pain, neck stiffness and ear discharge.   Respiratory: + cough, NO shortness of breath, wheezing  Cardiovascular: Negative for chest pain, palpitations and leg swelling.  Gastrointestinal: Negative for heartburn, abdominal pain, vomiting, diarrhea or consitpation Genitourinary: Negative for dysuria, urgency, frequency, hematuria Musculoskeletal: ++ forr back pain NO joint pain Neurological: Negative for dizziness, seizures, syncope, focal weakness,  numbness and headaches.  Hematological: Does not bruise/bleed easily.  Psychiatric/Behavioral: Negative for hallucinations, confusion, dysphoric mood    Tolerating Diet:yes      DRUG ALLERGIES:   Allergies  Allergen Reactions  . Augmentin [Amoxicillin-Pot Clavulanate] Diarrhea, Nausea And Vomiting and Other (See Comments)    Has patient had a PCN reaction causing immediate rash, facial/tongue/throat swelling, SOB or lightheadedness with hypotension: No Has patient had a PCN reaction causing severe rash involving mucus membranes or skin necrosis: No Has patient had a PCN reaction that required hospitalization No Has patient had a PCN reaction occurring within the last 10 years: Yes If all of the above answers are "NO", then may proceed with Cephalosporin use.   . Chantix [Varenicline] Other (See Comments)    Violent nightmares  . Sulfonamide Derivatives Other (See Comments)    Pt do not remember 8-30 md  . Zantac [Ranitidine Hcl] Diarrhea    VITALS:  Blood pressure (!) 157/86, pulse 80, temperature 97.9 F (36.6 C), temperature source Oral, resp. rate 16, height 5'  8" (1.727 m), weight 54.8 kg (120 lb 14.4 oz), SpO2 95 %.  PHYSICAL EXAMINATION:  Constitutional: Appears well-developed and well-nourished. No distress. HENT: Normocephalic. Marland Kitchen Oropharynx is clear and moist.  Eyes: Conjunctivae and EOM are normal. PERRLA, no scleral icterus.  Neck: Normal ROM. Neck supple. No JVD. No tracheal deviation. CVS: RRR, S1/S2 +, no murmurs, no gallops, no carotid bruit. Pleurex cath on left - under water seal Pulmonary: b/l, rhonchi NO  wheezes, rales. Good air movement both lungs Abdominal: Soft. BS +,  no distension, tenderness, rebound or guarding.  Musculoskeletal: Normal range of motion. No edema and no tenderness.  Neuro: Alert. CN 2-12 grossly intact. No focal deficits. Skin: Skin is warm and dry. No rash noted. PORT placed Psychiatric: Normal mood and affect.      LABORATORY PANEL:   CBC  Recent Labs Lab 08/19/16 0550  WBC 9.7  HGB 9.2*  HCT 28.6*  PLT 369   ------------------------------------------------------------------------------------------------------------------  Chemistries   Recent Labs Lab 08/17/16 1725  08/19/16 0550  NA 132*  < > 135  K 3.5  < > 3.9  CL 97*  < > 100*  CO2 27  < > 29  GLUCOSE 138*  < > 95  BUN 12  < > 11  CREATININE 0.79  < > 0.65  CALCIUM 8.7*  < > 8.7*  MG  --   < > 1.8  AST 22  --   --   ALT 13*  --   --   ALKPHOS 100  --   --   BILITOT 0.7  --   --   < > = values in this interval not displayed. ------------------------------------------------------------------------------------------------------------------  Cardiac Enzymes  Recent  Labs Lab 08/17/16 1725 08/18/16 0230 08/18/16 0854  TROPONINI <0.03 <0.03 <0.03   ------------------------------------------------------------------------------------------------------------------  RADIOLOGY:  Dg Chest 2 View  Result Date: 08/17/2016 CLINICAL DATA:  76 year old male with history of known pneumonia, bronchitis, and COPD presenting with  shortness of breath x3 days. Patient has history of lung cancer and pneumothorax. EXAM: CHEST  2 VIEW COMPARISON:  Chest radiograph dated 08/07/2016 FINDINGS: Left pectoral Port-A-Cath with tip in stable positioning at cavoatrial junction. There has been interval increase in the size of the left-sided pneumothorax measuring approximately 28 mm from the pleural surface (previously 10 mm). There is a small right pleural effusion with patchy area of density at the right lung base consistent with atelectasis versus infiltrate. This is progressed compared to prior radiograph. There is emphysematous changes of the lungs. Multiple surgical clips with adjacent subpleural postsurgical changes and scarring noted in the right upper lobe. There is stable cardiac size. The aorta is tortuous. There is osteopenia with degenerative changes of the spine. No acute osseous pathology noted. IMPRESSION: 1. Slight interval increase in the size of the left pneumothorax compared to the prior radiograph and CT. 2. Emphysema. 3. Small right pleural effusion and right lung base patchy opacity similar or slightly progressed compared to prior radiograph. 4. Postsurgical changes of right upper lobe. These results were called by telephone at the time of interpretation on 08/17/2016 at 6:48 pm to Dr. Joni Fears, who verbally acknowledged these results. Electronically Signed   By: Anner Crete M.D.   On: 08/17/2016 18:49   Dg Lumbar Spine 2-3 Views  Result Date: 08/18/2016 CLINICAL DATA:  Mid to lower back pain and left hip pain since yesterday when being moved from 1 bed to another using a transfer board. EXAM: LUMBAR SPINE - 2-3 VIEW COMPARISON:  Coronal and sagittal CT images through the lumbar spine from an abdominal and pelvic CT scan of Jul 21, 2016 FINDINGS: The lumbar vertebral bodies are preserved in height. There is gentle curvature convex toward the right centered at L3. There is a stable sclerotic focus in the body of L3. The disc  space heights are well maintained. There is no spondylolisthesis. There is mild facet joint hypertrophy at L5-S1. The pedicles and transverse processes are intact where visualized. The observed portions of the sacrum are grossly normal. There is calcification in the wall of the abdominal aorta with an infrarenal aneurysm demonstrated measuring up to 4.3 cm in diameter. IMPRESSION: No acute bony abnormality of the lumbar spine. Mild degenerative facet joint changes. Stable L3 sclerotic focus. Infrarenal abdominal aortic aneurysm measuring up to 4.3 cm in greatest AP dimension stable since the CT scan. Electronically Signed   By: David  Martinique M.D.   On: 08/18/2016 14:05   Dg Pelvis 1-2 Views  Result Date: 08/18/2016 CLINICAL DATA:  76 year old male with low back pain radiating to the left hip since yesterday after transferring from 1 bed 2 another. Known Bone metastases including left iliac wing, left L2 transverse process. EXAM: PELVIS - 1-2 VIEW COMPARISON:  CT Abdomen and Pelvis 07/21/2016 and earlier FINDINGS: Supine AP view of the pelvis at 1338 hours. The destructive left iliac wing bone metastasis seen on 07/21/2016 is largely radiographically occult. No pelvic fracture identified. Sacrum appears grossly intact. Femoral heads are normally located. Hip joint spaces are preserved. Iliofemoral calcified atherosclerosis. Negative visible bowel gas pattern. IMPRESSION: Poor radiographic visualization of the known left iliac wing bone metastasis. No acute fracture or dislocation identified about the pelvis. Electronically Signed  By: Genevie Ann M.D.   On: 08/18/2016 14:05   Ct Angio Chest Pe W Or Wo Contrast  Result Date: 08/17/2016 CLINICAL DATA:  Chemotherapy ongoing. RIGHT pneumothorax. LEFT pneumothorax. EXAM: CT ANGIOGRAPHY CHEST WITH CONTRAST TECHNIQUE: Multidetector CT imaging of the chest was performed using the standard protocol during bolus administration of intravenous contrast. Multiplanar CT image  reconstructions and MIPs were obtained to evaluate the vascular anatomy. CONTRAST:  Study 5 mL Isovue COMPARISON:  CT 08/04/2016, chest radiograph 08/17/2016, CT 04/27/2016 FINDINGS: Cardiovascular: Coronary artery calcification and aortic atherosclerotic calcification. Mediastinum/Nodes: No axillary or supraclavicular adenopathy. New mediastinal or hilar adenopathy. No pericardial fluid. No central pulmonary embolism. Lungs/Pleura: Moderate LEFT pneumothorax has increased in volume from CT of 08/04/2016. Pneumothorax occupies approximately 30% of lung volume. Nodule within the LEFT upper lobe measures 6 mm (image 47, series 6) unchanged. Nodule in the RIGHT upper lobe measuring 8 mm (image 32, series 6) decreased from 12 mm on comparison CT of 04/27/2016. More inferior nodule measuring 4 mm is unchanged from 05/06/2022. Nodular peripheral consolidation in the RIGHT lower lobe is improved. Small RIGHT effusion. Upper Abdomen: Limited view of the liver, kidneys, pancreas are unremarkable. Normal adrenal glands. Large LEFT renal cyst. Musculoskeletal: Expansile lesion in the anterior medial first rib is again demonstrated measuring 2.7 cm. Lesion within the T3 and T4 vertebral bodies are increased from CT in 05/06/22. For example lesion at T3 on the LEFT measures 2.6 cm increased from 1.2 cm (image 15, series 4. Lesion at T4 on the RIGHT measures 2.2 cm increased from 0.8 cm. Initial sclerotic and lesions lytic lesion more inferiorly are similar. Review of the MIP images confirms the above findings. IMPRESSION: 1. Interval increase in volume of LEFT pneumothorax occupying 30% LEFT hemithorax volume. 2. Stable bilateral pulmonary nodules. 3. Interval increase in soft tissue component of lytic lesions at T3 and T4 compared to CT of favor 04/27/2016. 4. Improvement in consolidation in the RIGHT lower lobe. Critical Value/emergent results were called by telephone at the time of interpretation on 08/17/2016 at 8:08 pm to Dr.  Carrie Mew , who verbally acknowledged these results. Electronically Signed   By: Suzy Bouchard M.D.   On: 08/17/2016 20:03   Dg Chest Port 1 View  Result Date: 08/19/2016 CLINICAL DATA:  Followup left chest tube. EXAM: PORTABLE CHEST 1 VIEW COMPARISON:  08/17/2016 FINDINGS: Small caliber left upper chest tube in place. Left pneumothorax smaller, less than 5%. Port-A-Cath appears the same. Chronic pulmonary scarring on the right with some basilar atelectasis. Heart size is unchanged. Chronic aortic atherosclerosis and tortuosity. Known lytic bone disease with subtle manifestation by radiography. IMPRESSION: Subtotal re-expansion of the left lung. Residual pleural air at the apex less than 5%. Electronically Signed   By: Nelson Chimes M.D.   On: 08/19/2016 07:41   Ct Image Guided Drainage By Percutaneous Catheter  Result Date: 08/18/2016 INDICATION: 76 year old male with a history of enlarging left-sided pneumothorax. EXAM: CT-GUIDED PLACEMENT OF LEFT THORACOSTOMY TUBE MEDICATIONS: The patient is currently admitted to the hospital and receiving intravenous antibiotics. The antibiotics were administered within an appropriate time frame prior to the initiation of the procedure. ANESTHESIA/SEDATION: 0.5 mg IV Versed 50 mcg IV Fentanyl Moderate Sedation Time:  15 The patient was continuously monitored during the procedure by the interventional radiology nurse under my direct supervision. COMPLICATIONS: NONE TECHNIQUE: Informed written consent was obtained from the patient after a thorough discussion of the procedural risks, benefits and alternatives. All questions were addressed. Maximal  Sterile Barrier Technique was utilized including caps, mask, sterile gowns, sterile gloves, sterile drape, hand hygiene and skin antiseptic. A timeout was performed prior to the initiation of the procedure. PROCEDURE: Patient positioned supine position on the CT gantry table. Scout CT of the chest was performed for  planning purposes. The left chest was then prepped and draped in the usual sterile fashion. Skin and subcutaneous tissues were generously infiltrated 1% lidocaine for local anesthesia. A small stab incision was made with 11 blade scalpel, and then a 10 French drain was placed into the left chest cavity with trocar technique. Once the drain was in position at the apex and the drain was sutured in position, drain was attached to aspiration with water seal chamber. Final CT was performed. Patient tolerated the procedure well and remained hemodynamically stable throughout. No complications were encountered and no significant blood loss. FINDINGS: Initial CT re- demonstrates multiple bilateral pulmonary nodules, as well as skeletal lesions compatible with metastases. Lesions better characterized on prior CT 08/17/2016 and 07/21/2016. Unexpected finding is a small right-sided pneumothorax which has developed in the interval compared to the CT 08/17/2016. Status post thoracostomy tube placement there is improving left-sided pneumothorax. IMPRESSION: Status post left-sided thoracostomy tube placement for treatment of persisting/enlarging pneumothorax. New small right-sided pneumothorax, which has developed in the interval compared to the CT 08/17/2016. Patient remained hemodynamically unchanged at the completion of the procedure. Signed, Dulcy Fanny. Earleen Newport, DO [facility] Electronically Signed   By: Corrie Mckusick D.O.   On: 08/18/2016 12:00     ASSESSMENT AND PLAN:   76 year old male with history of esophageal cancer, previous pneumothorax and COPD who presented dyspnea due to increasing pneumothorax.  1. BL Pneumothorax   Status post left-sided thoracostomy tube placement. Rpt CXR for new small rt sided pneumothorax Case discussed with Dr. Genevive Bi  2. Back pain: CT is demonstrating interval increase in soft tissue component of lytic lesions at T3 and  T4 Patient will need physical therapy consultation and pain management.  3. Abnormal EKG with T-wave inversions in inferior lateral leads:  Patient is asymptomatic. Acute MI ruled out with negative troponins   4. Hypokalemia: Repleted and recheck 3.9  5. Anemia of chronic disease: Continue to monitor CBC  6. History of CAD: Holding Plavix for procedures stated above  7. History of COPD without exacerbation  8  History of esophageal cancer: op f/u with primary oncology dr.Sherill after d/c, f/u with dr. Thomas Hoff Prn during hospital course  9. Hyponatremia: This is resolved  Management plans discussed with the patient and his daughter Magda Paganini they are  in agreement.  CODE STATUS: FULL  TOTAL TIME TAKING CARE OF THIS PATIENT: 34 minutes.     POSSIBLE D/C 2-3 days, DEPENDING ON CLINICAL CONDITION.   Nicholes Mango M.D on 08/19/2016 at 11:17 AM  Between 7am to 6pm - Pager - [phone] After 6pm go to [email or web address] - password [email or web address]  Office  [phone]  CC: Primary care physician; Tower, Wynelle Fanny, MD  Note: This dictation was prepared with Dragon dictation along with smaller phrase technology. Any transcriptional errors that result from this process are unintentional.

## 2016-08-19 NOTE — Clinical Social Work Note (Signed)
Clinical Social Work Assessment  Patient Details  Name: [name] MRN: [id number] Date of Birth: [birth year]  Date of referral:  08/19/16               Reason for consult:  Facility Placement                Permission sought to share information with:  Facility Sport and exercise psychologist, Family Supports Permission granted to share information::  Yes, Verbal Permission Granted  Name::        Agency::     Relationship::     Contact Information:     Housing/Transportation Living arrangements for the past 2 months:  Single Family Home Source of Information:  Patient Patient Interpreter Needed:  None Criminal Activity/Legal Involvement Pertinent to Current Situation/Hospitalization:  No - Comment as needed Significant Relationships:  Spouse, Adult Children Lives with:  Spouse Do you feel safe going back to the place where you live?  Yes Need for family participation in patient care:  Yes (Comment)  Care giving concerns:  Patient cares for his wife at home and he currently is less mobile than baseline.  Social Worker assessment / plan:  PT assessed patient and recommended STR. CSW spoke with patient this afternoon regarding PT recommendations. CSW explained role and purpose of visit. Patient states he is agreeable with going to STR. He asked appropriate questions. CSW explained the bed search process and will initiate a bed search today.   Employment status:  Retired Nurse, adult PT Recommendations:    Information / Referral to community resources:     Patient/Family's Response to care:  Patient expressed appreciation for CSW assistance.  Patient/Family's Understanding of and Emotional Response to Diagnosis, Current Treatment, and Prognosis:  Patient is aware of his current limitations and believes that he does need STR in order to improve his mobility and strength.  Emotional Assessment Appearance:  Appears older than stated  age Attitude/Demeanor/Rapport:   (pleasant and cooperative) Affect (typically observed):  Accepting, Adaptable, Calm, Pleasant Orientation:  Oriented to Self, Oriented to Place, Oriented to  Time, Oriented to Situation Alcohol / Substance use:  Not Applicable Psych involvement (Current and /or in the community):  No (Comment)  Discharge Needs  Concerns to be addressed:  Care Coordination Readmission within the last 30 days:  No Current discharge risk:  None Barriers to Discharge:  No Barriers Identified   Shela Leff, LCSW 08/19/2016, 2:55 PM

## 2016-08-19 NOTE — Progress Notes (Signed)
MEDICATION RELATED CONSULT NOTE - INITIAL   Pharmacy Consult for electrolyte management Indication: hypokalemia  Allergies  Allergen Reactions  . Augmentin [Amoxicillin-Pot Clavulanate] Diarrhea, Nausea And Vomiting and Other (See Comments)    Has patient had a PCN reaction causing immediate rash, facial/tongue/throat swelling, SOB or lightheadedness with hypotension: No Has patient had a PCN reaction causing severe rash involving mucus membranes or skin necrosis: No Has patient had a PCN reaction that required hospitalization No Has patient had a PCN reaction occurring within the last 10 years: Yes If all of the above answers are "NO", then may proceed with Cephalosporin use.   . Chantix [Varenicline] Other (See Comments)    Violent nightmares  . Sulfonamide Derivatives Other (See Comments)    Pt do not remember 8-30 md  . Zantac [Ranitidine Hcl] Diarrhea    Patient Measurements: Height: 5\' 8"  (172.7 cm) Weight: 120 lb 14.4 oz (54.8 kg) (bed scale) IBW/kg (Calculated) : 68.4 Adjusted Body Weight:   Vital Signs: Temp: 97.9 F (36.6 C) (05/31 0808) Temp Source: Oral (05/31 0808) BP: 157/86 (05/31 0808) Pulse Rate: 80 (05/31 0808) Intake/Output from previous day: 05/30 0701 - 05/31 0700 In: 120 [P.O.:120] Out: 1975 [Urine:1975] Intake/Output from this shift: Total I/O In: -  Out: 100 [Urine:100]  Labs:  Recent Labs  08/17/16 1725 08/18/16 0230 08/18/16 0854 08/19/16 0550  WBC 10.0 8.5  --  9.7  HGB 9.3* 8.5*  --  9.2*  HCT 28.0* 26.3*  --  28.6*  PLT 419 378  --  369  APTT  --   --  34  --   CREATININE 0.79 0.72  --  0.65  MG  --   --  1.7 1.8  PHOS  --   --  3.6 3.5  ALBUMIN 3.2*  --   --   --   PROT 6.3*  --   --   --   AST 22  --   --   --   ALT 13*  --   --   --   ALKPHOS 100  --   --   --   BILITOT 0.7  --   --   --    Estimated Creatinine Clearance: 60.9 mL/min (by C-G formula based on SCr of 0.65 mg/dL).   Microbiology: Recent Results (from  the past 720 hour(s))  TECHNOLOGIST REVIEW     Status: None   Collection Time: 07/27/16  8:00 AM  Result Value Ref Range Status   Technologist Review   Final    Rare meta on scan, Sl poly, Few ovalos and schistocytes  Urine culture     Status: None   Collection Time: 08/07/16  4:58 PM  Result Value Ref Range Status   Specimen Description URINE, RANDOM  Final   Special Requests NONE  Final   Culture   Final    NO GROWTH Performed at [facility], [address], [place], [place] [address]    Report Status 08/09/2016 FINAL  Final    Medical History: Past Medical History:  Diagnosis Date  . AAA (abdominal aortic aneurysm) (HCC)    4.2 cm IR AAA noted on 01/13/15 PET scan  . Anxiety   . CAD (coronary artery disease)    AMI 1997 w/ BMS x 2 LAD, BMS CFX 2002, DES x 2 LAD 2009, DES CFX & OM 2012   . Carotid artery disease ([place])    RICA CEA 1308, LICA CEA 6578 per Dr. [name],   .  COPD (chronic obstructive pulmonary disease) ([place])   . GERD (gastroesophageal reflux disease)    occ  . HTN (hypertension)   . Hydradenitis   . Hyperlipidemia   . Ischemic cardiomyopathy    EF 40-45 percent at cath 2012  . Myocardial infarction ([place])   . Non-small cell lung cancer (NSCLC) ([place]) dx'd 12/2014   SBRT  . Osteoarthritis    pt. denies 01/17/2015  . PAC (premature atrial contraction)   . Peripheral vascular disease ([place])   . Pneumonia   . Prostate cancer ([place]) dx'd approx 2008   xrt throughfoley  . Radiation 02/18/15-02/25/15   right upper lung 54 Gy  . Shortness of breath dyspnea   . Stroke [facility]) 11-24-11   "no feeling in right 5th digit"  . Urinary frequency   . Urinary urgency     Medications:  Infusions:    Assessment: 76 yom cc SOB, hypokalemia noted this morning, pharmacy consulted to manage electrolytes.  Goal of Therapy:  Electrolytes WNL  Plan:  Daily potassium ordered. Will increase dose to 40 meq this AM, 20 meq this PM, then resume regular dosing of 20 meq po  daily. Add-on magnesium and phosphorus pending. Will recheck all electrolytes tomorrow with AM labs.  5/30 0854 K 3, Mg 1.7 , Phos 3.6. Will recheck tomorrow with AM labs.   5/31 0550 K 3.9, Mg 1.8, Phos 3.5. No indication for further supplement at this point. Will recheck electrolytes tomorrow with AM labs.   Laural Benes, Pharm.D., BCPS Clinical Pharmacist 08/19/2016,8:36 AM

## 2016-08-19 NOTE — NC FL2 (Signed)
[place] LEVEL OF CARE SCREENING TOOL     IDENTIFICATION  Patient Name: [name] Birthdate: 1940-11-08 Sex: male Admission Date (Current Location): 08/17/2016  [place] and [place] Number:  Engineering geologist and Address:  [facility], [address], [place], [place] [address]      Provider Number: [id number]  Attending Physician Name and Address:  Nicholes Mango, MD  Relative Name and Phone Number:       Current Level of Care: Hospital Recommended Level of Care: [place] Prior Approval Number:    Date Approved/Denied: 08/19/16 PASRR Number: [id number]  Discharge Plan: SNF    Current Diagnoses: Patient Active Problem List   Diagnosis Date Noted  . Pneumothorax 08/18/2016  . Pneumothorax, left   . Dehydration 08/11/2016  . Low magnesium level 08/11/2016  . Pneumonia 08/07/2016  . COPD exacerbation ([place]) 08/04/2016  . Port catheter in place 04/06/2016  . Cancer of lower third of esophagus ([place]) 02/24/2016  . Dysphagia 02/04/2016  . Left arm weakness 12/19/2015  . Numbness and tingling in left arm 12/19/2015  . Left shoulder pain 12/19/2015  . History of recent fall 12/19/2015  . Paresthesia of arm 12/01/2015  . Depression 12/01/2015  . Malnutrition of moderate degree 11/20/2015  . TIA (transient ischemic attack) 11/19/2015  . Weakness 11/17/2015  . Physical deconditioning 11/10/2015  . Multiple rib fractures 11/10/2015  . Chronic systolic heart failure ([place]) 11/02/2015  . Atrial flutter ([place]) 10/30/2015  . B12 deficiency 07/22/2015  . Anemia 07/15/2015  . Hematuria 07/08/2015  . Unintentional weight loss 07/08/2015  . Hypotension 07/08/2015  . 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 Cough 05/22/2015  . Tobacco user 05/22/2015  . Mediastinal lymphadenopathy 01/22/2015  . 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 S/P bronchoscopy with biopsy   . Colon cancer screening 11/06/2014  .  Encounter for Medicare annual wellness exam 11/06/2014  . Routine general medical examination at a health care facility 11/06/2014  . Lung nodule 06/28/2014  . Carotid stenosis 12/20/2013  . Goals of care, counseling/discussion 12/20/2013  . Aftercare following surgery of the circulatory system, [place] 12/19/2012  . Personal history of colonic polyps 08/28/2012  . Hyperglycemia 08/07/2012  . Pre-operative cardiovascular examination 11/03/2011  . Carotid artery disease ([place]) 05/20/2011  . Grief reaction 05/07/2011  . Ischemic cardiomyopathy 01/13/2011  . Palpitations 11/10/2010  . Occlusion and stenosis of carotid artery without mention of cerebral infarction 04/02/2010  . Anxiety disorder 12/12/2009  . HYPERTENSION, BENIGN 09/29/2008  . History of prostate cancer 09/19/2008  . Hyperlipidemia 02/10/2007  . AMAUROSIS FUGAX 02/10/2007  . MYOCARDIAL INFARCTION, HX OF 02/10/2007  . Coronary atherosclerosis 02/10/2007  . PERIPHERAL VASCULAR DISEASE 02/10/2007  . HEMORRHOIDS 02/10/2007  . Chronic obstructive pulmonary disease ([place]) 02/10/2007  . OSTEOARTHRITIS 02/10/2007  . Nicotine dependence 02/10/2007  . Pure hypercholesterolemia 01/27/2007  . CORONARY ATHEROSCLEROSIS NATIVE CORONARY ARTERY 01/27/2007    Orientation RESPIRATION BLADDER Height & Weight     Self, Time, Situation, Place  Normal Continent Weight: 120 lb 14.4 oz (54.8 kg) (bed scale) Height:  5\' 8"  (172.7 cm)  BEHAVIORAL SYMPTOMS/MOOD NEUROLOGICAL BOWEL NUTRITION STATUS   (none)  (none) Continent Diet (regular)  AMBULATORY STATUS COMMUNICATION OF NEEDS Skin   Extensive Assist Verbally Normal                       Personal Care Assistance Level of Assistance  Bathing, Dressing Bathing Assistance:  Limited assistance   Dressing Assistance: Limited assistance     Functional Limitations Info   (none)          SPECIAL CARE FACTORS FREQUENCY  PT (By licensed PT)                    Contractures  Contractures Info: Not present    Additional Factors Info  Code Status, Allergies Code Status Info: full Allergies Info: augmentin; zantac; chantix;sulfanimide derivitives           Current Medications (08/19/2016):  This is the current hospital active medication list Current Facility-Administered Medications  Medication Dose Route Frequency Provider Last Rate Last Dose  . acetaminophen (TYLENOL) tablet 1,000 mg  1,000 mg Oral Q4H PRN Mody, Sital, MD      . albuterol (PROVENTIL) (2.5 MG/3ML) 0.083% nebulizer solution 2.5 mg  2.5 mg Nebulization Q6H PRN Hugelmeyer, Alexis, DO      . ALPRAZolam Duanne Moron) tablet 0.25-0.5 mg  0.25-0.5 mg Oral Daily PRN Hugelmeyer, Alexis, DO      . bisacodyl (DULCOLAX) EC tablet 5 mg  5 mg Oral Daily PRN Hugelmeyer, Alexis, DO      . clopidogrel (PLAVIX) tablet 75 mg  75 mg Oral Daily Mody, Sital, MD   75 mg at 08/19/16 1048  . guaiFENesin (MUCINEX) 12 hr tablet 600 mg  600 mg Oral BID Hugelmeyer, Alexis, DO   600 mg at 08/19/16 0350   And  . dextromethorphan (DELSYM) 30 MG/5ML liquid 30 mg  30 mg Oral BID Hugelmeyer, Alexis, DO   30 mg at 08/19/16 0840  . diphenoxylate-atropine (LOMOTIL) 2.5-0.025 MG per tablet 1-2 tablet  1-2 tablet Oral QID PRN Hugelmeyer, Alexis, DO      . fludrocortisone (FLORINEF) tablet 0.1 mg  0.1 mg Oral Daily Hugelmeyer, Alexis, DO   0.1 mg at 08/19/16 1048  . heparin injection 5,000 Units  5,000 Units Subcutaneous Q8H Hugelmeyer, Alexis, DO   5,000 Units at 08/19/16 1412  . ipratropium (ATROVENT) nebulizer solution 0.5 mg  0.5 mg Nebulization Q6H PRN Hugelmeyer, Alexis, DO      . ipratropium-albuterol (DUONEB) 0.5-2.5 (3) MG/3ML nebulizer solution 3 mL  3 mL Nebulization Q4H PRN Hugelmeyer, Alexis, DO      . lidocaine (XYLOCAINE) 2 % viscous mouth solution 20 mL  20 mL Mouth/Throat PRN Hugelmeyer, Alexis, DO      . lidocaine-EPINEPHrine (XYLOCAINE W/EPI) 2 %-1:100000 (with pres) injection 40 mL  40 mL Infiltration Once Carrie Mew, MD      . lidocaine-prilocaine (EMLA) cream   Topical Once Hugelmeyer, Alexis, DO   Stopped at 08/17/16 2353  . loratadine (CLARITIN) tablet 10 mg  10 mg Oral Daily Hugelmeyer, Alexis, DO   10 mg at 08/19/16 1048  . magnesium citrate solution 1 Bottle  1 Bottle Oral Once PRN Hugelmeyer, Alexis, DO      . mirtazapine (REMERON) tablet 15 mg  15 mg Oral QHS Hugelmeyer, Alexis, DO   15 mg at 08/18/16 2121  . mometasone-formoterol (DULERA) 200-5 MCG/ACT inhaler 2 puff  2 puff Inhalation BID Hugelmeyer, Alexis, DO   2 puff at 08/19/16 0840  . multivitamin with minerals tablet 1 tablet  1 tablet Oral Daily Hugelmeyer, Alexis, DO   1 tablet at 08/19/16 1048  . nitroGLYCERIN (NITROSTAT) SL tablet 0.4 mg  0.4 mg Sublingual Q5 min PRN Hugelmeyer, Alexis, DO      . ondansetron (ZOFRAN) tablet 4 mg  4 mg Oral Q6H PRN Hugelmeyer, Alexis, DO  Or  . ondansetron (ZOFRAN) injection 4 mg  4 mg Intravenous Q6H PRN Hugelmeyer, Alexis, DO      . pantoprazole (PROTONIX) EC tablet 40 mg  40 mg Oral Daily Hugelmeyer, Alexis, DO   40 mg at 08/19/16 1048  . potassium chloride (KLOR-CON) packet 20 mEq  20 mEq Oral Daily Bettey Costa, MD   20 mEq at 08/19/16 1048  . predniSONE (DELTASONE) tablet 10 mg  10 mg Oral QODAY Hugelmeyer, Alexis, DO   10 mg at 08/18/16 1410  . prochlorperazine (COMPAZINE) tablet 5 mg  5 mg Oral Q6H PRN Hugelmeyer, Alexis, DO      . senna-docusate (Senokot-S) tablet 1 tablet  1 tablet Oral QHS PRN Hugelmeyer, Alexis, DO      . tiotropium (SPIRIVA) inhalation capsule 18 mcg  18 mcg Inhalation Daily Hugelmeyer, Alexis, DO   18 mcg at 08/19/16 1049  . traMADol (ULTRAM) tablet 50 mg  50 mg Oral Q6H PRN Hugelmeyer, Alexis, DO   50 mg at 08/19/16 0840     Discharge Medications: Please see discharge summary for a list of discharge medications.  Relevant Imaging Results:  Relevant Lab Results:   Additional Information ss: [id number]  Shela Leff, LCSW

## 2016-08-20 ENCOUNTER — Ambulatory Visit: Payer: Medicare Other | Admitting: Family Medicine

## 2016-08-20 ENCOUNTER — Encounter
Admission: RE | Admit: 2016-08-20 | Discharge: 2016-08-20 | Disposition: A | Discharge status: Home / Self Care | Payer: Medicare Other | Source: Ambulatory Visit | Attending: Internal Medicine | Admitting: Internal Medicine

## 2016-08-20 ENCOUNTER — Inpatient Hospital Stay: Payer: Medicare Other

## 2016-08-20 DIAGNOSIS — Z8673 Personal history of transient ischemic attack (TIA), and cerebral infarction without residual deficits: Secondary | ICD-10-CM | POA: Diagnosis not present

## 2016-08-20 DIAGNOSIS — J42 Unspecified chronic bronchitis: Secondary | ICD-10-CM | POA: Diagnosis not present

## 2016-08-20 DIAGNOSIS — Z85118 Personal history of other malignant neoplasm of bronchus and lung: Secondary | ICD-10-CM | POA: Diagnosis not present

## 2016-08-20 DIAGNOSIS — I739 Peripheral vascular disease, unspecified: Secondary | ICD-10-CM | POA: Diagnosis not present

## 2016-08-20 DIAGNOSIS — I252 Old myocardial infarction: Secondary | ICD-10-CM | POA: Diagnosis not present

## 2016-08-20 DIAGNOSIS — M549 Dorsalgia, unspecified: Secondary | ICD-10-CM | POA: Diagnosis not present

## 2016-08-20 DIAGNOSIS — M898X9 Other specified disorders of bone, unspecified site: Secondary | ICD-10-CM | POA: Diagnosis not present

## 2016-08-20 DIAGNOSIS — C155 Malignant neoplasm of lower third of esophagus: Secondary | ICD-10-CM | POA: Diagnosis not present

## 2016-08-20 DIAGNOSIS — F17210 Nicotine dependence, cigarettes, uncomplicated: Secondary | ICD-10-CM | POA: Diagnosis not present

## 2016-08-20 DIAGNOSIS — C799 Secondary malignant neoplasm of unspecified site: Secondary | ICD-10-CM | POA: Diagnosis not present

## 2016-08-20 DIAGNOSIS — D6481 Anemia due to antineoplastic chemotherapy: Secondary | ICD-10-CM | POA: Diagnosis not present

## 2016-08-20 DIAGNOSIS — I110 Hypertensive heart disease with heart failure: Secondary | ICD-10-CM | POA: Diagnosis not present

## 2016-08-20 DIAGNOSIS — J449 Chronic obstructive pulmonary disease, unspecified: Secondary | ICD-10-CM | POA: Diagnosis not present

## 2016-08-20 DIAGNOSIS — Z7189 Other specified counseling: Secondary | ICD-10-CM | POA: Diagnosis not present

## 2016-08-20 DIAGNOSIS — Z7952 Long term (current) use of systemic steroids: Secondary | ICD-10-CM | POA: Diagnosis not present

## 2016-08-20 DIAGNOSIS — R45851 Suicidal ideations: Secondary | ICD-10-CM | POA: Diagnosis not present

## 2016-08-20 DIAGNOSIS — M6281 Muscle weakness (generalized): Secondary | ICD-10-CM | POA: Diagnosis not present

## 2016-08-20 DIAGNOSIS — Z7951 Long term (current) use of inhaled steroids: Secondary | ICD-10-CM | POA: Diagnosis not present

## 2016-08-20 DIAGNOSIS — Y939 Activity, unspecified: Secondary | ICD-10-CM | POA: Diagnosis not present

## 2016-08-20 DIAGNOSIS — E860 Dehydration: Secondary | ICD-10-CM | POA: Diagnosis not present

## 2016-08-20 DIAGNOSIS — D649 Anemia, unspecified: Secondary | ICD-10-CM | POA: Diagnosis not present

## 2016-08-20 DIAGNOSIS — K219 Gastro-esophageal reflux disease without esophagitis: Secondary | ICD-10-CM | POA: Diagnosis not present

## 2016-08-20 DIAGNOSIS — I951 Orthostatic hypotension: Secondary | ICD-10-CM | POA: Diagnosis not present

## 2016-08-20 DIAGNOSIS — G893 Neoplasm related pain (acute) (chronic): Secondary | ICD-10-CM | POA: Diagnosis not present

## 2016-08-20 DIAGNOSIS — J811 Chronic pulmonary edema: Secondary | ICD-10-CM | POA: Diagnosis not present

## 2016-08-20 DIAGNOSIS — J939 Pneumothorax, unspecified: Secondary | ICD-10-CM | POA: Diagnosis not present

## 2016-08-20 DIAGNOSIS — I5023 Acute on chronic systolic (congestive) heart failure: Secondary | ICD-10-CM | POA: Diagnosis not present

## 2016-08-20 DIAGNOSIS — C159 Malignant neoplasm of esophagus, unspecified: Secondary | ICD-10-CM | POA: Diagnosis not present

## 2016-08-20 DIAGNOSIS — I2510 Atherosclerotic heart disease of native coronary artery without angina pectoris: Secondary | ICD-10-CM | POA: Diagnosis not present

## 2016-08-20 DIAGNOSIS — C7951 Secondary malignant neoplasm of bone: Secondary | ICD-10-CM | POA: Diagnosis not present

## 2016-08-20 DIAGNOSIS — R918 Other nonspecific abnormal finding of lung field: Secondary | ICD-10-CM | POA: Diagnosis not present

## 2016-08-20 DIAGNOSIS — Y929 Unspecified place or not applicable: Secondary | ICD-10-CM | POA: Diagnosis not present

## 2016-08-20 DIAGNOSIS — Z7902 Long term (current) use of antithrombotics/antiplatelets: Secondary | ICD-10-CM | POA: Diagnosis not present

## 2016-08-20 DIAGNOSIS — D630 Anemia in neoplastic disease: Secondary | ICD-10-CM | POA: Diagnosis not present

## 2016-08-20 DIAGNOSIS — E871 Hypo-osmolality and hyponatremia: Secondary | ICD-10-CM | POA: Diagnosis not present

## 2016-08-20 DIAGNOSIS — Z8546 Personal history of malignant neoplasm of prostate: Secondary | ICD-10-CM | POA: Diagnosis not present

## 2016-08-20 DIAGNOSIS — J309 Allergic rhinitis, unspecified: Secondary | ICD-10-CM | POA: Diagnosis not present

## 2016-08-20 DIAGNOSIS — W19XXXA Unspecified fall, initial encounter: Secondary | ICD-10-CM | POA: Diagnosis not present

## 2016-08-20 DIAGNOSIS — E2740 Unspecified adrenocortical insufficiency: Secondary | ICD-10-CM | POA: Diagnosis not present

## 2016-08-20 DIAGNOSIS — Z452 Encounter for adjustment and management of vascular access device: Secondary | ICD-10-CM | POA: Diagnosis not present

## 2016-08-20 DIAGNOSIS — Y998 Other external cause status: Secondary | ICD-10-CM | POA: Diagnosis not present

## 2016-08-20 DIAGNOSIS — S0990XA Unspecified injury of head, initial encounter: Secondary | ICD-10-CM | POA: Diagnosis not present

## 2016-08-20 DIAGNOSIS — R262 Difficulty in walking, not elsewhere classified: Secondary | ICD-10-CM | POA: Diagnosis not present

## 2016-08-20 DIAGNOSIS — I255 Ischemic cardiomyopathy: Secondary | ICD-10-CM | POA: Diagnosis not present

## 2016-08-20 DIAGNOSIS — E876 Hypokalemia: Secondary | ICD-10-CM | POA: Diagnosis not present

## 2016-08-20 DIAGNOSIS — M79605 Pain in left leg: Secondary | ICD-10-CM | POA: Diagnosis not present

## 2016-08-20 DIAGNOSIS — S199XXA Unspecified injury of neck, initial encounter: Secondary | ICD-10-CM | POA: Diagnosis not present

## 2016-08-20 DIAGNOSIS — J9383 Other pneumothorax: Secondary | ICD-10-CM | POA: Diagnosis not present

## 2016-08-20 DIAGNOSIS — J189 Pneumonia, unspecified organism: Secondary | ICD-10-CM | POA: Diagnosis not present

## 2016-08-20 LAB — BASIC METABOLIC PANEL
ANION GAP: 7 (ref 5–15)
BUN: 14 mg/dL (ref 6–20)
CALCIUM: 8.8 mg/dL — AB (ref 8.9–10.3)
CHLORIDE: 96 mmol/L — AB (ref 101–111)
CO2: 29 mmol/L (ref 22–32)
Creatinine, Ser: 0.68 mg/dL (ref 0.61–1.24)
GFR calc non Af Amer: >60 mL/min (ref >60)
GLUCOSE: 90 mg/dL (ref 65–99)
Potassium: 3.7 mmol/L (ref 3.5–5.1)
Sodium: 132 mmol/L — ABNORMAL LOW (ref 135–145)

## 2016-08-20 LAB — PHOSPHORUS: PHOSPHORUS: 4.1 mg/dL (ref 2.5–4.6)

## 2016-08-20 LAB — MAGNESIUM: Magnesium: 1.7 mg/dL (ref 1.7–2.4)

## 2016-08-20 MED ORDER — HEPARIN SOD (PORK) LOCK FLUSH 100 UNIT/ML IV SOLN
500.0000 [IU] | Freq: Once | INTRAVENOUS | Status: AC
  Administered 2016-08-20: 500 [IU] via INTRAVENOUS
  Filled 2016-08-20: qty 5

## 2016-08-20 MED ORDER — TRAMADOL HCL 50 MG PO TABS: 50.0000 mg | ORAL_TABLET | Freq: Four times a day (QID) | ORAL | 0 refills | Status: AC | PRN

## 2016-08-20 MED ORDER — ALPRAZOLAM 0.5 MG PO TABS: 0.2500 mg | ORAL_TABLET | Freq: Every day | ORAL | 0 refills | Status: DC | PRN

## 2016-08-20 MED ORDER — BISACODYL 5 MG PO TBEC: 5.0000 mg | DELAYED_RELEASE_TABLET | Freq: Every day | ORAL | 0 refills | Status: AC | PRN

## 2016-08-20 NOTE — Discharge Summary (Signed)
[facility] [place] NAME: [name]    MR#:  [id number]  DATE OF BIRTH:  07-Mar-1941  DATE OF ADMISSION:  08/17/2016 ADMITTING PHYSICIAN: Harvie Bridge, DO  DATE OF DISCHARGE: 08/20/16 PRIMARY CARE PHYSICIAN: Tower, Wynelle Fanny, MD    ADMISSION DIAGNOSIS:  Dyspnea [R06.00] Pneumothorax, unspecified type [J93.9] Dyspnea, unspecified type [R06.00]  DISCHARGE DIAGNOSIS:   bilateral pneumothorax  SECONDARY DIAGNOSIS:   Past Medical History:  Diagnosis Date  . AAA (abdominal aortic aneurysm) (HCC)    4.2 cm IR AAA noted on 01/13/15 PET scan  . Anxiety   . CAD (coronary artery disease)    AMI 1997 w/ BMS x 2 LAD, BMS CFX 2002, DES x 2 LAD 2009, DES CFX & OM 2012   . Carotid artery disease ([place])    RICA CEA 6295, LICA CEA 2841 per Dr. [name],   . COPD (chronic obstructive pulmonary disease) ([place])   . GERD (gastroesophageal reflux disease)    occ  . HTN (hypertension)   . Hydradenitis   . Hyperlipidemia   . Ischemic cardiomyopathy    EF 40-45 percent at cath 2012  . Myocardial infarction ([place])   . Non-small cell lung cancer (NSCLC) ([place]) dx'd 12/2014   SBRT  . Osteoarthritis    pt. denies 01/17/2015  . PAC (premature atrial contraction)   . Peripheral vascular disease ([place])   . Pneumonia   . Prostate cancer ([place]) dx'd approx 2008   xrt throughfoley  . Radiation 02/18/15-02/25/15   right upper lung 54 Gy  . Shortness of breath dyspnea   . Stroke [facility]) 11-24-11   "no feeling in right 5th digit"  . Urinary frequency   . Urinary urgency     HOSPITAL COURSE:   HPI: [name] is a 76 y.o. male with a known history of Coronary artery disease, COPD, ischemic cardiomyopathy, esophageal cancer undergoing chemotherapy presents to the emergency department for evaluation of shortness of breath.  Patient was in a usual state of health until 2 weeks ago when he describes progressively worsening shortness of breath, dyspnea on  exertion associated with a nonproductive cough.. His symptoms have worsened over the last 2-3 days. Of note patient was discharged from the hospital with a pneumothorax that presented on a CT scan of 08/04/2016.  Patient denies fevers/chills, weakness, dizziness, chest pain, , N/V/C/D, abdominal pain, dysuria/frequency, changes in mental status.    Otherwise there has been no change in status. Patient has been taking medication as prescribed and there has been no recent change in medication or diet.  No recent antibiotics.  There has been no recent illness, hospitalizations, travel or sick contacts.    1. BL Pneumothorax   Status post left-sided thoracostomy tube placement.  CXR  new small rt sided pneumothorax Case discussed with Dr. Genevive Bi, repeat chest x-ray, bilateral pneumothorax significantly improved Chest tube was removed by Dr. Genevive Bi and okay to discharge patient from his standpoint  2. Back pain: CT is demonstrating interval increase in soft tissue component of lytic lesions at T3 and T4 Patient will need physical therapy consultation and pain management. Discharged to skilled nursing facility  3. Abnormal EKG with T-wave inversions in inferior lateral leads:  Patient is asymptomatic. Acute MI ruled out with negative troponins   4. Hypokalemia: Repleted and recheck 3.7  5. Anemia of chronic disease: Continue to monitor CBC  6. History of CAD: resume Plavix for procedures stated above  7. History of COPD  without exacerbation  8  History of esophageal cancer: op f/u with primary oncology dr.Sherill after d/c, f/u with dr. Thomas Hoff Prn during hospital course  9. Hyponatremia: This is resolved  Management plans discussed with the patient and his daughter [name] they are  in agreement.   DISCHARGE CONDITIONS:   stable  CONSULTS OBTAINED:  Treatment Team:  Florene Glen, MD Cammie Sickle, MD   PROCEDURES Left-sided thoracostomy tube placement and  removal  DRUG ALLERGIES:   Allergies  Allergen Reactions  . Augmentin [Amoxicillin-Pot Clavulanate] Diarrhea, Nausea And Vomiting and Other (See Comments)    Has patient had a PCN reaction causing immediate rash, facial/tongue/throat swelling, SOB or lightheadedness with hypotension: No Has patient had a PCN reaction causing severe rash involving mucus membranes or skin necrosis: No Has patient had a PCN reaction that required hospitalization No Has patient had a PCN reaction occurring within the last 10 years: Yes If all of the above answers are "NO", then may proceed with Cephalosporin use.   . Chantix [Varenicline] Other (See Comments)    Violent nightmares  . Sulfonamide Derivatives Other (See Comments)    Pt do not remember 8-30 md  . Zantac [Ranitidine Hcl] Diarrhea    DISCHARGE MEDICATIONS:   Current Discharge Medication List    START taking these medications   Details  bisacodyl (DULCOLAX) 5 MG EC tablet Take 1 tablet (5 mg total) by mouth daily as needed for moderate constipation. Qty: 30 tablet, Refills: 0      CONTINUE these medications which have CHANGED   Details  ALPRAZolam (XANAX) 0.5 MG tablet Take 0.5-1 tablets (0.25-0.5 mg total) by mouth daily as needed for anxiety or sleep. 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 Qty: 20 tablet, Refills: 0   Associated Diagnoses: Cancer of lower third of esophagus ([place])      CONTINUE these medications which have NOT CHANGED   Details  acetaminophen (TYLENOL) 500 MG tablet Take 1,000 mg by mouth every 4 (four) hours as needed for mild pain, moderate pain, fever or headache.     clopidogrel (PLAVIX) 75 MG tablet TAKE 1 TABLET BY MOUTH EVERY DAY Qty: 30 tablet, Refills: 7    diphenoxylate-atropine (LOMOTIL) 2.5-0.025 MG tablet Take 1-2 tablets by mouth 4 (four) times  daily as needed for diarrhea or loose stools. 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 Qty: 30 tablet, Refills: 3    fluticasone-salmeterol (ADVAIR HFA) 115-21 MCG/ACT inhaler Inhale 2 puffs into the lungs 2 (two) times daily.    ipratropium-albuterol (DUONEB) 0.5-2.5 (3) MG/3ML SOLN Take 3 mLs by nebulization every 4 (four) hours as needed (shortness of breath and wheezing).    lidocaine (XYLOCAINE) 2 % solution Use as directed 20 mLs in the mouth or throat as needed for mouth pain.    lidocaine-prilocaine (EMLA) cream Apply to port site one hour prior to use. Do not rub in. Cover with plastic. Qty: 30 g, Refills: 2    loratadine (CLARITIN) 10 MG tablet TAKE 1 TABLET BY MOUTH EVERY DAY Qty: 30 tablet, Refills: 5    mirtazapine (REMERON) 15 MG tablet TAKE 1 TABLET (15 MG TOTAL) BY MOUTH AT  BEDTIME. Qty: 30 tablet, Refills: 6    Multiple Vitamin (MULTIVITAMIN WITH MINERALS) TABS tablet Take 1 tablet by mouth daily.    nitroGLYCERIN (NITROSTAT) 0.4 MG SL tablet Place 0.4 mg under the tongue every 5 (five) minutes as needed for chest pain.    omeprazole (PRILOSEC) 40 MG capsule TAKE 1 CAPSULE 30 MINUTES BEFORE BREAKFAST AND DINNER Qty: 180 capsule, Refills: 1   Associated Diagnoses: Esophageal dysphagia; Abnormal barium swallow; Abnormal CT scan, esophagus; Odynophagia    potassium chloride 20 MEQ/15ML (10%) SOLN Take 15 mLs (20 mEq total) by mouth daily. Qty: 450 mL, Refills: 0   Associated Diagnoses: Cancer of lower third of esophagus ([place]); Hypokalemia    prochlorperazine (COMPAZINE) 5 MG tablet Take 1 tablet (5 mg total) by mouth every 6 (six) hours as needed for nausea or vomiting. Qty: 30 tablet, Refills: 1   Associated Diagnoses: Cancer of lower third of esophagus (HCC)    tiotropium (SPIRIVA) 18 MCG inhalation capsule Place 1 capsule  (18 mcg total) into inhaler and inhale daily. Qty: 30 capsule, Refills: 5    VENTOLIN HFA 108 (90 Base) MCG/ACT inhaler INHALE 2 PUFFS INTO THE LUNGS EVERY 4 (FOUR) HOURS AS NEEDED FOR WHEEZING OR SHORTNESS OF BREATH. Qty: 18 g, Refills: 5    predniSONE (DELTASONE) 20 MG tablet Take 0.5 tablets (10 mg total) by mouth every other day. Please continue after you take 50 mg prednisone for 5 days To stimulate appetite   Associated Diagnoses: Cancer of lower third of esophagus (HCC)         DISCHARGE INSTRUCTIONS:   Follow-up with primary care physician in a week Follow-up with cardiothoracic surgeon Dr. Genevive Bi in a week Continue rehabilitation  follow-up with primary oncologist in a week    DIET:  Cardiac diet  DISCHARGE CONDITION:  Stable  ACTIVITY:  Activity as tolerated  OXYGEN:  Home Oxygen: No.   Oxygen Delivery: room air  DISCHARGE LOCATION:  nursing home   If you experience worsening of your admission symptoms, develop shortness of breath, life threatening emergency, suicidal or homicidal thoughts you must seek medical attention immediately by calling 911 or calling your MD immediately  if symptoms less severe.  You Must read complete instructions/literature along with all the possible adverse reactions/side effects for all the Medicines you take and that have been prescribed to you. Take any new Medicines after you have completely understood and accpet all the possible adverse reactions/side effects.   Please note  You were cared for by a hospitalist during your hospital stay. If you have any questions about your discharge medications or the care you received while you were in the hospital after you are discharged, you can call the unit and asked to speak with the hospitalist on call if the hospitalist that took care of you is not available. Once you are discharged, your primary care physician will handle any further medical issues. Please note that NO REFILLS for any  discharge medications will be authorized once you are discharged, as it is imperative that you return to your primary care physician (or establish a relationship with a primary care physician if you do not have one) for your aftercare needs so that they can reassess your need for medications and monitor your lab values.     Today  Chief Complaint  Patient presents with  . Shortness of Breath   Patient is feeling better denies any shortness of breath  ROS: CONSTITUTIONAL: Denies fevers, chills. Denies any fatigue, weakness.  EYES: Denies blurry vision, double vision, eye pain. EARS, NOSE, THROAT: Denies tinnitus, ear pain, hearing loss. RESPIRATORY: Denies cough, wheeze, shortness of breath.  CARDIOVASCULAR: Denies chest pain, palpitations, edema.  GASTROINTESTINAL: Denies nausea, vomiting, diarrhea, abdominal pain. Denies bright red blood per rectum. GENITOURINARY: Denies dysuria, hematuria. ENDOCRINE: Denies nocturia or thyroid problems. HEMATOLOGIC AND LYMPHATIC: Denies easy bruising or bleeding. SKIN: Denies rash or lesion. MUSCULOSKELETAL: Denies pain in neck, back, shoulder, knees, hips or arthritic symptoms.  NEUROLOGIC: Denies paralysis, paresthesias.  PSYCHIATRIC: Denies anxiety or depressive symptoms.   VITAL SIGNS:  Blood pressure 115/65, pulse 84, temperature 97.7 F (36.5 C), resp. rate 16, height 5\' 8"  (1.727 m), weight 54.8 kg (120 lb 14.4 oz), SpO2 93 %.  I/O:    Intake/Output Summary (Last 24 hours) at 08/20/16 1025 Last data filed at 08/20/16 0900  Gross per 24 hour  Intake              240 ml  Output              603 ml  Net             -363 ml    PHYSICAL EXAMINATION:  GENERAL:  76 y.o.-year-old patient lying in the bed with no acute distress.  EYES: Pupils equal, round, reactive to light and accommodation. No scleral icterus. Extraocular muscles intact.  HEENT: Head atraumatic, normocephalic. Oropharynx and nasopharynx clear.  NECK:  Supple, no  jugular venous distention. No thyroid enlargement, no tenderness.  LUNGS: Normal breath sounds bilaterally, no wheezing, rales,rhonchi or crepitation. No use of accessory muscles of respiration.  CARDIOVASCULAR: S1, S2 normal. No murmurs, rubs, or gallops. Anterior chest wall with Port-A-Cath ABDOMEN: Soft, non-tender, non-distended. Bowel sounds present.  EXTREMITIES: No pedal edema, cyanosis, or clubbing.  NEUROLOGIC: Cranial nerves II through XII are intact. Muscle strength 5/5 in all extremities. Sensation intact. Gait not checked.  PSYCHIATRIC: The patient is alert and oriented x 3.  SKIN: No obvious rash, lesion, or ulcer.   DATA REVIEW:   CBC  Recent Labs Lab 08/19/16 0550  WBC 9.7  HGB 9.2*  HCT 28.6*  PLT 369    Chemistries   Recent Labs Lab 08/17/16 1725  08/20/16 0500  NA 132*  < > 132*  K 3.5  < > 3.7  CL 97*  < > 96*  CO2 27  < > 29  GLUCOSE 138*  < > 90  BUN 12  < > 14  CREATININE 0.79  < > 0.68  CALCIUM 8.7*  < > 8.8*  MG  --   < > 1.7  AST 22  --   --   ALT 13*  --   --   ALKPHOS 100  --   --   BILITOT 0.7  --   --   < > = values in this interval not displayed.  Cardiac Enzymes  Recent Labs Lab 08/18/16 0854  TROPONINI <0.03    Microbiology Results  Results for orders placed or performed during the hospital encounter of 08/07/16  Urine culture     Status: None   Collection Time: 08/07/16  4:58 PM  Result Value Ref Range Status   Specimen Description URINE, RANDOM  Final   Special Requests NONE  Final   Culture   Final    NO GROWTH Performed at [facility], [address], [place], [place] [address]    Report Status 08/09/2016 FINAL  Final    RADIOLOGY:  Dg  Chest 2 View  Result Date: 08/17/2016 CLINICAL DATA:  76 year old male with history of known pneumonia, bronchitis, and COPD presenting with shortness of breath x3 days. Patient has history of lung cancer and pneumothorax. EXAM: CHEST  2 VIEW COMPARISON:  Chest radiograph  dated 08/07/2016 FINDINGS: Left pectoral Port-A-Cath with tip in stable positioning at cavoatrial junction. There has been interval increase in the size of the left-sided pneumothorax measuring approximately 28 mm from the pleural surface (previously 10 mm). There is a small right pleural effusion with patchy area of density at the right lung base consistent with atelectasis versus infiltrate. This is progressed compared to prior radiograph. There is emphysematous changes of the lungs. Multiple surgical clips with adjacent subpleural postsurgical changes and scarring noted in the right upper lobe. There is stable cardiac size. The aorta is tortuous. There is osteopenia with degenerative changes of the spine. No acute osseous pathology noted. IMPRESSION: 1. Slight interval increase in the size of the left pneumothorax compared to the prior radiograph and CT. 2. Emphysema. 3. Small right pleural effusion and right lung base patchy opacity similar or slightly progressed compared to prior radiograph. 4. Postsurgical changes of right upper lobe. These results were called by telephone at the time of interpretation on 08/17/2016 at 6:48 pm to Dr. Joni Fears, who verbally acknowledged these results. Electronically Signed   By: Anner Crete M.D.   On: 08/17/2016 18:49   Dg Lumbar Spine 2-3 Views  Result Date: 08/18/2016 CLINICAL DATA:  Mid to lower back pain and left hip pain since yesterday when being moved from 1 bed to another using a transfer board. EXAM: LUMBAR SPINE - 2-3 VIEW COMPARISON:  Coronal and sagittal CT images through the lumbar spine from an abdominal and pelvic CT scan of Jul 21, 2016 FINDINGS: The lumbar vertebral bodies are preserved in height. There is gentle curvature convex toward the right centered at L3. There is a stable sclerotic focus in the body of L3. The disc space heights are well maintained. There is no spondylolisthesis. There is mild facet joint hypertrophy at L5-S1. The pedicles and  transverse processes are intact where visualized. The observed portions of the sacrum are grossly normal. There is calcification in the wall of the abdominal aorta with an infrarenal aneurysm demonstrated measuring up to 4.3 cm in diameter. IMPRESSION: No acute bony abnormality of the lumbar spine. Mild degenerative facet joint changes. Stable L3 sclerotic focus. Infrarenal abdominal aortic aneurysm measuring up to 4.3 cm in greatest AP dimension stable since the CT scan. Electronically Signed   By: David  Martinique M.D.   On: 08/18/2016 14:05   Dg Pelvis 1-2 Views  Result Date: 08/18/2016 CLINICAL DATA:  76 year old male with low back pain radiating to the left hip since yesterday after transferring from 1 bed 2 another. Known Bone metastases including left iliac wing, left L2 transverse process. EXAM: PELVIS - 1-2 VIEW COMPARISON:  CT Abdomen and Pelvis 07/21/2016 and earlier FINDINGS: Supine AP view of the pelvis at 1338 hours. The destructive left iliac wing bone metastasis seen on 07/21/2016 is largely radiographically occult. No pelvic fracture identified. Sacrum appears grossly intact. Femoral heads are normally located. Hip joint spaces are preserved. Iliofemoral calcified atherosclerosis. Negative visible bowel gas pattern. IMPRESSION: Poor radiographic visualization of the known left iliac wing bone metastasis. No acute fracture or dislocation identified about the pelvis. Electronically Signed   By: Genevie Ann M.D.   On: 08/18/2016 14:05   Ct Angio Chest Pe W Or Wo  Contrast  Result Date: 08/17/2016 CLINICAL DATA:  Chemotherapy ongoing. RIGHT pneumothorax. LEFT pneumothorax. EXAM: CT ANGIOGRAPHY CHEST WITH CONTRAST TECHNIQUE: Multidetector CT imaging of the chest was performed using the standard protocol during bolus administration of intravenous contrast. Multiplanar CT image reconstructions and MIPs were obtained to evaluate the vascular anatomy. CONTRAST:  Study 5 mL Isovue COMPARISON:  CT 08/04/2016,  chest radiograph 08/17/2016, CT 04/27/2016 FINDINGS: Cardiovascular: Coronary artery calcification and aortic atherosclerotic calcification. Mediastinum/Nodes: No axillary or supraclavicular adenopathy. New mediastinal or hilar adenopathy. No pericardial fluid. No central pulmonary embolism. Lungs/Pleura: Moderate LEFT pneumothorax has increased in volume from CT of 08/04/2016. Pneumothorax occupies approximately 30% of lung volume. Nodule within the LEFT upper lobe measures 6 mm (image 47, series 6) unchanged. Nodule in the RIGHT upper lobe measuring 8 mm (image 32, series 6) decreased from 12 mm on comparison CT of 04/27/2016. More inferior nodule measuring 4 mm is unchanged from 05/18/2022. Nodular peripheral consolidation in the RIGHT lower lobe is improved. Small RIGHT effusion. Upper Abdomen: Limited view of the liver, kidneys, pancreas are unremarkable. Normal adrenal glands. Large LEFT renal cyst. Musculoskeletal: Expansile lesion in the anterior medial first rib is again demonstrated measuring 2.7 cm. Lesion within the T3 and T4 vertebral bodies are increased from CT in 05/18/2022. For example lesion at T3 on the LEFT measures 2.6 cm increased from 1.2 cm (image 15, series 4. Lesion at T4 on the RIGHT measures 2.2 cm increased from 0.8 cm. Initial sclerotic and lesions lytic lesion more inferiorly are similar. Review of the MIP images confirms the above findings. IMPRESSION: 1. Interval increase in volume of LEFT pneumothorax occupying 30% LEFT hemithorax volume. 2. Stable bilateral pulmonary nodules. 3. Interval increase in soft tissue component of lytic lesions at T3 and T4 compared to CT of favor 04/27/2016. 4. Improvement in consolidation in the RIGHT lower lobe. Critical Value/emergent results were called by telephone at the time of interpretation on 08/17/2016 at 8:08 pm to Dr. Carrie Mew , who verbally acknowledged these results. Electronically Signed   By: Suzy Bouchard M.D.   On: 08/17/2016  20:03   Dg Chest Port 1 View  Result Date: 08/20/2016 CLINICAL DATA:  Postoperative evaluation. EXAM: PORTABLE CHEST 1 VIEW COMPARISON:  08/09/2016.  CT 08/17/2016. FINDINGS: Surgical clips right chest. PowerPort catheter with lead tip over the superior vena cava. Left chest tube in stable position. No pneumothorax. Mild infiltrate right lung base, no change. Right apical pleural-parenchymal thickening no change. No pleural effusion or pneumothorax. Known bony lesions best identified by CT . IMPRESSION: 1. PowerPort catheter left chest tube in stable position. No pneumothorax. 2. Persistent right base mild infiltrate. Right upper lung pleuroparenchymal thickening again noted. 3. Known bony lesions best identified by prior CT. Electronically Signed   By: Marcello Moores  Register   On: 08/20/2016 06:45   Dg Chest Port 1 View  Result Date: 08/19/2016 CLINICAL DATA:  Followup left chest tube. EXAM: PORTABLE CHEST 1 VIEW COMPARISON:  08/17/2016 FINDINGS: Small caliber left upper chest tube in place. Left pneumothorax smaller, less than 5%. Port-A-Cath appears the same. Chronic pulmonary scarring on the right with some basilar atelectasis. Heart size is unchanged. Chronic aortic atherosclerosis and tortuosity. Known lytic bone disease with subtle manifestation by radiography. IMPRESSION: Subtotal re-expansion of the left lung. Residual pleural air at the apex less than 5%. Electronically Signed   By: Nelson Chimes M.D.   On: 08/19/2016 07:41   Ct Image Guided Drainage By Percutaneous Catheter  Result Date: 08/18/2016  INDICATION: 76 year old male with a history of enlarging left-sided pneumothorax. EXAM: CT-GUIDED PLACEMENT OF LEFT THORACOSTOMY TUBE MEDICATIONS: The patient is currently admitted to the hospital and receiving intravenous antibiotics. The antibiotics were administered within an appropriate time frame prior to the initiation of the procedure. ANESTHESIA/SEDATION: 0.5 mg IV Versed 50 mcg IV Fentanyl Moderate  Sedation Time:  15 The patient was continuously monitored during the procedure by the interventional radiology nurse under my direct supervision. COMPLICATIONS: NONE TECHNIQUE: Informed written consent was obtained from the patient after a thorough discussion of the procedural risks, benefits and alternatives. All questions were addressed. Maximal Sterile Barrier Technique was utilized including caps, mask, sterile gowns, sterile gloves, sterile drape, hand hygiene and skin antiseptic. A timeout was performed prior to the initiation of the procedure. PROCEDURE: Patient positioned supine position on the CT gantry table. Scout CT of the chest was performed for planning purposes. The left chest was then prepped and draped in the usual sterile fashion. Skin and subcutaneous tissues were generously infiltrated 1% lidocaine for local anesthesia. A small stab incision was made with 11 blade scalpel, and then a 10 French drain was placed into the left chest cavity with trocar technique. Once the drain was in position at the apex and the drain was sutured in position, drain was attached to aspiration with water seal chamber. Final CT was performed. Patient tolerated the procedure well and remained hemodynamically stable throughout. No complications were encountered and no significant blood loss. FINDINGS: Initial CT re- demonstrates multiple bilateral pulmonary nodules, as well as skeletal lesions compatible with metastases. Lesions better characterized on prior CT 08/17/2016 and 07/21/2016. Unexpected finding is a small right-sided pneumothorax which has developed in the interval compared to the CT 08/17/2016. Status post thoracostomy tube placement there is improving left-sided pneumothorax. IMPRESSION: Status post left-sided thoracostomy tube placement for treatment of persisting/enlarging pneumothorax. New small right-sided pneumothorax, which has developed in the interval compared to the CT 08/17/2016. Patient remained  hemodynamically unchanged at the completion of the procedure. Signed, Dulcy Fanny. Earleen Newport, DO [facility] Radiology Electronically Signed   By: Corrie Mckusick D.O.   On: 08/18/2016 12:00    EKG:   Orders placed or performed during the hospital encounter of 08/17/16  . ED EKG  . ED EKG  . EKG 12-Lead  . EKG 12-Lead  . EKG 12-Lead  . EKG 12-Lead  . EKG 12-Lead      Management plans discussed with the patient, family and they are in agreement.  CODE STATUS:     Code Status Orders        Start     Ordered   08/17/16 2335  Full code  Continuous     08/17/16 2335    Code Status History    Date Active Date Inactive Code Status Order ID Comments User Context   08/07/2016  9:50 PM 08/09/2016  4:19 PM Full Code [id number]  Lance Coon, MD ED   08/04/2016  6:50 PM 08/05/2016  6:55 PM Full Code [id number]  Henreitta Leber, MD Inpatient   01/23/2016  5:30 PM 01/25/2016  4:48 PM Full Code [id number]  Epifanio Lesches, MD ED   11/20/2015 12:33 AM 11/20/2015  7:59 PM Full Code [id number]  Verdon, Ubaldo Glassing, DO Inpatient   10/30/2015  2:53 PM 11/01/2015  5:14 PM Full Code [id number]  Thurnell Lose, MD Inpatient   11/24/2011  3:30 PM 11/27/2011  1:13 PM Full Code [id number]  Fields, Vick Frees, RN  Inpatient    Advance Directive Documentation     Most Recent Value  Type of Advance Directive  Healthcare Power of Attorney  Pre-existing out of facility DNR order (yellow form or pink MOST form)  -  "MOST" Form in Place?  -      TOTAL TIME TAKING CARE OF THIS PATIENT: 45  minutes.   Note: This dictation was prepared with Dragon dictation along with smaller phrase technology. Any transcriptional errors that result from this process are unintentional.   @[facility] @  on 08/20/2016 at 10:25 AM  Between 7am to 6pm - Pager - [phone]  After 6pm go to [email or web address] - password [email or web address] [facility]  [facility]  Office  [phone]  CC: Primary care  physician; Tower, Wynelle Fanny, MD

## 2016-08-20 NOTE — Progress Notes (Addendum)
Pt A and O x 4. VSS. Pt tolerating diet well. No complaints of pain or nausea. Port deaccessed with no complications, prescriptions given. Pt's daughter voiced understanding of discharge instructions with no further questions. Pt discharged via wheelchair with daughter. Report called to [place].

## 2016-08-20 NOTE — Clinical Social Work Note (Signed)
PT to discharge today to [facility] as this is the facility that patient's daughter, Magda Paganini, chose. Discharge information sent to [facility]. Patient's daughter to transport. Shela Leff MSW,LCSW

## 2016-08-20 NOTE — Clinical Social Work Placement (Signed)
   CLINICAL SOCIAL WORK PLACEMENT  NOTE  Date:  08/20/2016  Patient Details  Name: [name] MRN: [id number] Date of Birth: April 22, 1940  Clinical Social Work is seeking post-discharge placement for this patient at the   level of care (*CSW will initial, date and re-position this form in  chart as items are completed):      Patient/family provided with [place] Work Department's list of facilities offering this level of care within the geographic area requested by the patient (or if unable, by the patient's family).      Patient/family informed of their freedom to choose among providers that offer the needed level of care, that participate in Medicare, Medicaid or managed care program needed by the patient, have an available bed and are willing to accept the patient.      Patient/family informed of [facility]'s ownership interest in [facility] and [facility], as well as of the fact that they are under no obligation to receive care at these facilities.  PASRR submitted to EDS on       PASRR number received on       Existing PASRR number confirmed on       FL2 transmitted to all facilities in geographic area requested by pt/family on       FL2 transmitted to all facilities within larger geographic area on       Patient informed that his/her managed care company has contracts with or will negotiate with certain facilities, including the following:        Yes   Patient/family informed of bed offers received.  Patient chooses bed at  [facility])     Physician recommends and patient chooses bed at  [facility])    Patient to be transferred to  [facility]) on 08/20/16.  Patient to be transferred to facility by  (family)     Patient family notified on 08/20/16 of transfer.  Name of family member notified:   (daughter: Magda Paganini)     PHYSICIAN       Additional Comment:    _______________________________________________ Shela Leff, LCSW 08/20/2016, 11:30  AM

## 2016-08-20 NOTE — Progress Notes (Signed)
Pt is alert and responsive enough to take his medications at this time.

## 2016-08-20 NOTE — Care Management Important Message (Signed)
Important Message  Patient Details  Name: [name] MRN: [id number] Date of Birth: [birth year]   Medicare Important Message Given:  N/A - LOS <3 / Initial given by admissions    Beverly Sessions, RN 08/20/2016, 1:57 PM

## 2016-08-20 NOTE — Progress Notes (Signed)
MEDICATION RELATED CONSULT NOTE - INITIAL   Pharmacy Consult for electrolyte management Indication: hypokalemia  Allergies  Allergen Reactions  . Augmentin [Amoxicillin-Pot Clavulanate] Diarrhea, Nausea And Vomiting and Other (See Comments)    Has patient had a PCN reaction causing immediate rash, facial/tongue/throat swelling, SOB or lightheadedness with hypotension: No Has patient had a PCN reaction causing severe rash involving mucus membranes or skin necrosis: No Has patient had a PCN reaction that required hospitalization No Has patient had a PCN reaction occurring within the last 10 years: Yes If all of the above answers are "NO", then may proceed with Cephalosporin use.   . Chantix [Varenicline] Other (See Comments)    Violent nightmares  . Sulfonamide Derivatives Other (See Comments)    Pt do not remember 8-30 md  . Zantac [Ranitidine Hcl] Diarrhea    Patient Measurements: Height: 5\' 8"  (172.7 cm) Weight: 120 lb 14.4 oz (54.8 kg) (bed scale) IBW/kg (Calculated) : 68.4 Adjusted Body Weight:   Vital Signs: Temp: 97.7 F (36.5 C) (06/01 0439) BP: 115/65 (06/01 0439) Pulse Rate: 84 (06/01 0439) Intake/Output from previous day: 05/31 0701 - 06/01 0700 In: 240 [P.O.:240] Out: 953 [Urine:950; Chest Tube:3] Intake/Output from this shift: No intake/output data recorded.  Labs:  Recent Labs  08/17/16 1725 08/18/16 0230 08/18/16 0854 08/19/16 0550 08/20/16 0500  WBC 10.0 8.5  --  9.7  --   HGB 9.3* 8.5*  --  9.2*  --   HCT 28.0* 26.3*  --  28.6*  --   PLT 419 378  --  369  --   APTT  --   --  34  --   --   CREATININE 0.79 0.72  --  0.65 0.68  MG  --   --  1.7 1.8 1.7  PHOS  --   --  3.6 3.5 4.1  ALBUMIN 3.2*  --   --   --   --   PROT 6.3*  --   --   --   --   AST 22  --   --   --   --   ALT 13*  --   --   --   --   ALKPHOS 100  --   --   --   --   BILITOT 0.7  --   --   --   --    Estimated Creatinine Clearance: 60.9 mL/min (by C-G formula based on SCr of  0.68 mg/dL).   Microbiology: Recent Results (from the past 720 hour(s))  TECHNOLOGIST REVIEW     Status: None   Collection Time: 07/27/16  8:00 AM  Result Value Ref Range Status   Technologist Review   Final    Rare meta on scan, Sl poly, Few ovalos and schistocytes  Urine culture     Status: None   Collection Time: 08/07/16  4:58 PM  Result Value Ref Range Status   Specimen Description URINE, RANDOM  Final   Special Requests NONE  Final   Culture   Final    NO GROWTH Performed at [facility], [address] [place], [place] [address]    Report Status 08/09/2016 FINAL  Final    Medical History: Past Medical History:  Diagnosis Date  . AAA (abdominal aortic aneurysm) (HCC)    4.2 cm IR AAA noted on 01/13/15 PET scan  . Anxiety   . CAD (coronary artery disease)    AMI 1997 w/ BMS x 2 LAD, BMS  CFX 2002, DES x 2 LAD 2009, DES CFX & OM 2012   . Carotid artery disease ([place])    RICA CEA 8341, LICA CEA 9622 per Dr. [name],   . COPD (chronic obstructive pulmonary disease) ([place])   . GERD (gastroesophageal reflux disease)    occ  . HTN (hypertension)   . Hydradenitis   . Hyperlipidemia   . Ischemic cardiomyopathy    EF 40-45 percent at cath 2012  . Myocardial infarction ([place])   . Non-small cell lung cancer (NSCLC) ([place]) dx'd 12/2014   SBRT  . Osteoarthritis    pt. denies 01/17/2015  . PAC (premature atrial contraction)   . Peripheral vascular disease ([place])   . Pneumonia   . Prostate cancer ([place]) dx'd approx 2008   xrt throughfoley  . Radiation 02/18/15-02/25/15   right upper lung 54 Gy  . Shortness of breath dyspnea   . Stroke [facility]) 11-24-11   "no feeling in right 5th digit"  . Urinary frequency   . Urinary urgency     Medications:  Infusions:    Assessment: 76 yom cc SOB, hypokalemia noted this morning, pharmacy consulted to manage electrolytes.  Goal of Therapy:  Electrolytes WNL  Plan:  Daily potassium ordered. Will increase dose to 40 meq this AM, 20  meq this PM, then resume regular dosing of 20 meq po daily. Add-on magnesium and phosphorus pending. Will recheck all electrolytes tomorrow with AM labs.  5/30 0854 K 3, Mg 1.7 , Phos 3.6. Will recheck tomorrow with AM labs.   5/31 0550 K 3.9, Mg 1.8, Phos 3.5. No indication for further supplement at this point. Will recheck electrolytes tomorrow with AM labs.   6/1 0500 K 3.7, Mg 1.7, Phos 4.1. No indication for further supplement at this point. Will recheck electrolytes with AM labs in 2 days.   Laural Benes, Pharm.D., BCPS Clinical Pharmacist 08/20/2016,10:19 AM

## 2016-08-20 NOTE — Progress Notes (Signed)
  Patient ID: SYRE KNERR, male   DOB: 1941-03-16, 76 y.o.   MRN: [id number]  HISTORY: Apparently the patient was somewhat agitated last evening and received some Xanax. Today he remains somewhat somnolent although he is responsive. He does not have any specific complaints. He states that his breathing is improved.   Vitals:   08/19/16 2152 08/20/16 0439  BP: (!) 166/82 115/65  Pulse: 76 84  Resp: 16 16  Temp: 98.3 F (36.8 C) 97.7 F (36.5 C)     EXAM:    Resp: Lungs are clear Distant bilaterally..  No respiratory distress, normal effort. Heart:  Regular without murmurs Abd:  Abdomen is soft, non distended and non tender. No masses are palpable.  There is no rebound and no guarding.    There is no air leak.  Independent review of his chest x-ray shows no pneumothorax on the left. Right-sided pneumothorax is not well-seen.   ASSESSMENT: Status post chest tube insertion for left-sided pneumothorax. No evidence of air leak or pneumothorax at the present time.   PLAN:   I removed his chest tube today without difficulty. He tolerated that well. I could see in the outpatient office area if needed.    Nestor Lewandowsky, MD

## 2016-08-20 NOTE — Discharge Instructions (Signed)
Follow-up with primary care physician in a week Follow-up with cardiothoracic surgeon Dr. Genevive Bi in a week Continue rehabilitation  follow-up with primary oncologist in a week

## 2016-08-23 ENCOUNTER — Other Ambulatory Visit
Admission: RE | Admit: 2016-08-23 | Discharge: 2016-08-23 | Disposition: A | Discharge status: Home / Self Care | Payer: Medicare Other | Source: Ambulatory Visit | Attending: Internal Medicine | Admitting: Internal Medicine

## 2016-08-23 ENCOUNTER — Telehealth: Payer: Self-pay

## 2016-08-23 ENCOUNTER — Telehealth: Payer: Self-pay | Admitting: *Deleted

## 2016-08-23 DIAGNOSIS — C799 Secondary malignant neoplasm of unspecified site: Secondary | ICD-10-CM | POA: Insufficient documentation

## 2016-08-23 DIAGNOSIS — C159 Malignant neoplasm of esophagus, unspecified: Secondary | ICD-10-CM | POA: Diagnosis not present

## 2016-08-23 DIAGNOSIS — E871 Hypo-osmolality and hyponatremia: Secondary | ICD-10-CM | POA: Insufficient documentation

## 2016-08-23 DIAGNOSIS — J42 Unspecified chronic bronchitis: Secondary | ICD-10-CM | POA: Diagnosis not present

## 2016-08-23 DIAGNOSIS — D649 Anemia, unspecified: Secondary | ICD-10-CM | POA: Insufficient documentation

## 2016-08-23 DIAGNOSIS — J939 Pneumothorax, unspecified: Secondary | ICD-10-CM | POA: Diagnosis not present

## 2016-08-23 LAB — CBC WITH DIFFERENTIAL/PLATELET
BASOS ABS: 0 10*3/uL (ref 0–0.1)
Basophils Relative: 1 %
Eosinophils Absolute: 0 10*3/uL (ref 0–0.7)
Eosinophils Relative: 0 %
HEMATOCRIT: 32.3 % — AB (ref 40.0–52.0)
HEMOGLOBIN: 10.3 g/dL — AB (ref 13.0–18.0)
Lymphocytes Relative: 12 %
Lymphs Abs: 0.8 10*3/uL — ABNORMAL LOW (ref 1.0–3.6)
MCH: 27.2 pg (ref 26.0–34.0)
MCHC: 31.8 g/dL — ABNORMAL LOW (ref 32.0–36.0)
MCV: 85.5 fL (ref 80.0–100.0)
MONO ABS: 0.6 10*3/uL (ref 0.2–1.0)
Monocytes Relative: 9 %
NEUTROS ABS: 5.4 10*3/uL (ref 1.4–6.5)
Neutrophils Relative %: 78 %
Platelets: 400 10*3/uL (ref 150–440)
RBC: 3.78 MIL/uL — ABNORMAL LOW (ref 4.40–5.90)
RDW: 23.8 % — AB (ref 11.5–14.5)
WBC: 6.8 10*3/uL (ref 3.8–10.6)

## 2016-08-23 LAB — COMPREHENSIVE METABOLIC PANEL
ALK PHOS: 116 U/L (ref 38–126)
ALT: 10 U/L — ABNORMAL LOW (ref 17–63)
AST: 17 U/L (ref 15–41)
Albumin: 3.2 g/dL — ABNORMAL LOW (ref 3.5–5.0)
Anion gap: 8 (ref 5–15)
BILIRUBIN TOTAL: 0.4 mg/dL (ref 0.3–1.2)
BUN: 18 mg/dL (ref 6–20)
CALCIUM: 9 mg/dL (ref 8.9–10.3)
CO2: 29 mmol/L (ref 22–32)
Chloride: 94 mmol/L — ABNORMAL LOW (ref 101–111)
Creatinine, Ser: 0.71 mg/dL (ref 0.61–1.24)
GFR calc Af Amer: >60 mL/min (ref >60)
GFR calc non Af Amer: >60 mL/min (ref >60)
Glucose, Bld: 103 mg/dL — ABNORMAL HIGH (ref 65–99)
POTASSIUM: 4.4 mmol/L (ref 3.5–5.1)
Sodium: 131 mmol/L — ABNORMAL LOW (ref 135–145)
TOTAL PROTEIN: 6.6 g/dL (ref 6.5–8.1)

## 2016-08-23 NOTE — Telephone Encounter (Signed)
Lm requesting return call to complete TCM and confirm hosp f/u appt  

## 2016-08-23 NOTE — Telephone Encounter (Signed)
Post Op call made to patient at this time.Left a message for patient to call back with any questions or concerns was calling to see how he was feeling since being discharged from the hospital. I also stated that he has a appointment with Dr. Genevive Bi on 6/8.

## 2016-08-23 NOTE — Telephone Encounter (Signed)
Daughter called to cancel appts for 6/5. Pt was dc from hospital to rehab on 6/1 and she feels he is not strong enough for chemo at this time.  Lab/ flush/ BS/ inf/ nut all cancelled. She did keep next appt for lab/ flush/ inf on 6/12.  Daughter was thinking of cancelling a few cycles of chemo but said she would see as the appt got closer. Does Dr Benay Spice want to see pt on 6/12 or 6/19?

## 2016-08-24 ENCOUNTER — Ambulatory Visit: Payer: Medicare Other | Admitting: Oncology

## 2016-08-24 ENCOUNTER — Ambulatory Visit: Payer: Medicare Other

## 2016-08-24 ENCOUNTER — Telehealth: Payer: Self-pay | Admitting: *Deleted

## 2016-08-24 ENCOUNTER — Other Ambulatory Visit: Payer: Medicare Other

## 2016-08-24 ENCOUNTER — Telehealth: Payer: Self-pay | Admitting: Family Medicine

## 2016-08-24 NOTE — Telephone Encounter (Signed)
Yes-it is fine to cancel appt with me , thanks for letting me know

## 2016-08-24 NOTE — Telephone Encounter (Signed)
Returned call to pt's daughter. She reports they have decided to stop treatment. Recommended he keep lab/office visit appointment, she agreed to do so.

## 2016-08-24 NOTE — Telephone Encounter (Signed)
Patient's daughter,Lisa,called about patient's appointment for a hospital follow up with Dr.Tower on Friday.  Patient has appointment the same day to see Dr.Oakes who put the tube in patient's pneumothorax. Lattie Haw wants to know if she can cancel the appointment with Dr.Tower, since he'll be seeing Dr.Oakes?  Lattie Haw said if she doesn't answer, a detailed message can be left on her voice mail.

## 2016-08-24 NOTE — Telephone Encounter (Signed)
Spoke to [place] and advised per Dr Glori Bickers; appt cancelled.

## 2016-08-26 ENCOUNTER — Other Ambulatory Visit: Payer: Self-pay

## 2016-08-26 DIAGNOSIS — J939 Pneumothorax, unspecified: Secondary | ICD-10-CM

## 2016-08-27 ENCOUNTER — Ambulatory Visit: Payer: Medicare Other | Admitting: Family Medicine

## 2016-08-27 ENCOUNTER — Ambulatory Visit
Admission: RE | Admit: 2016-08-27 | Discharge: 2016-08-27 | Disposition: A | Discharge status: Home / Self Care | Payer: Medicare Other | Source: Ambulatory Visit | Attending: *Deleted | Admitting: *Deleted

## 2016-08-27 ENCOUNTER — Encounter: Payer: Self-pay | Admitting: Cardiothoracic Surgery

## 2016-08-27 ENCOUNTER — Emergency Department: Payer: Medicare Other

## 2016-08-27 ENCOUNTER — Encounter: Payer: Self-pay | Admitting: Emergency Medicine

## 2016-08-27 ENCOUNTER — Emergency Department
Admission: EM | Admit: 2016-08-27 | Discharge: 2016-08-27 | Disposition: A | Discharge status: Skilled Nursing Facility | Payer: Medicare Other | Attending: Emergency Medicine | Admitting: Emergency Medicine

## 2016-08-27 ENCOUNTER — Ambulatory Visit (INDEPENDENT_AMBULATORY_CARE_PROVIDER_SITE_OTHER): Payer: Medicare Other | Admitting: Cardiothoracic Surgery

## 2016-08-27 ENCOUNTER — Ambulatory Visit
Admission: RE | Admit: 2016-08-27 | Discharge: 2016-08-27 | Disposition: A | Discharge status: Home / Self Care | Payer: Medicare Other | Source: Ambulatory Visit | Attending: Cardiothoracic Surgery | Admitting: Cardiothoracic Surgery

## 2016-08-27 VITALS — BP 109/71 | HR 94 | Temp 97.3°F

## 2016-08-27 DIAGNOSIS — M6281 Muscle weakness (generalized): Secondary | ICD-10-CM | POA: Diagnosis not present

## 2016-08-27 DIAGNOSIS — S0990XA Unspecified injury of head, initial encounter: Secondary | ICD-10-CM | POA: Diagnosis not present

## 2016-08-27 DIAGNOSIS — Y939 Activity, unspecified: Secondary | ICD-10-CM | POA: Insufficient documentation

## 2016-08-27 DIAGNOSIS — J189 Pneumonia, unspecified organism: Secondary | ICD-10-CM | POA: Diagnosis not present

## 2016-08-27 DIAGNOSIS — C7951 Secondary malignant neoplasm of bone: Secondary | ICD-10-CM | POA: Diagnosis not present

## 2016-08-27 DIAGNOSIS — E860 Dehydration: Secondary | ICD-10-CM | POA: Diagnosis not present

## 2016-08-27 DIAGNOSIS — W19XXXA Unspecified fall, initial encounter: Secondary | ICD-10-CM | POA: Insufficient documentation

## 2016-08-27 DIAGNOSIS — I110 Hypertensive heart disease with heart failure: Secondary | ICD-10-CM | POA: Diagnosis not present

## 2016-08-27 DIAGNOSIS — J939 Pneumothorax, unspecified: Secondary | ICD-10-CM

## 2016-08-27 DIAGNOSIS — I5023 Acute on chronic systolic (congestive) heart failure: Secondary | ICD-10-CM | POA: Insufficient documentation

## 2016-08-27 DIAGNOSIS — J449 Chronic obstructive pulmonary disease, unspecified: Secondary | ICD-10-CM | POA: Diagnosis not present

## 2016-08-27 DIAGNOSIS — J9383 Other pneumothorax: Secondary | ICD-10-CM | POA: Diagnosis not present

## 2016-08-27 DIAGNOSIS — F17210 Nicotine dependence, cigarettes, uncomplicated: Secondary | ICD-10-CM | POA: Insufficient documentation

## 2016-08-27 DIAGNOSIS — Z8673 Personal history of transient ischemic attack (TIA), and cerebral infarction without residual deficits: Secondary | ICD-10-CM | POA: Insufficient documentation

## 2016-08-27 DIAGNOSIS — Y929 Unspecified place or not applicable: Secondary | ICD-10-CM | POA: Insufficient documentation

## 2016-08-27 DIAGNOSIS — Y998 Other external cause status: Secondary | ICD-10-CM | POA: Insufficient documentation

## 2016-08-27 DIAGNOSIS — S199XXA Unspecified injury of neck, initial encounter: Secondary | ICD-10-CM | POA: Diagnosis not present

## 2016-08-27 DIAGNOSIS — C799 Secondary malignant neoplasm of unspecified site: Secondary | ICD-10-CM | POA: Insufficient documentation

## 2016-08-27 LAB — COMPREHENSIVE METABOLIC PANEL
ALT: 12 U/L — AB (ref 17–63)
AST: 16 U/L (ref 15–41)
Albumin: 3.1 g/dL — ABNORMAL LOW (ref 3.5–5.0)
Alkaline Phosphatase: 112 U/L (ref 38–126)
Anion gap: 8 (ref 5–15)
BILIRUBIN TOTAL: 0.4 mg/dL (ref 0.3–1.2)
BUN: 13 mg/dL (ref 6–20)
CALCIUM: 8.9 mg/dL (ref 8.9–10.3)
CO2: 28 mmol/L (ref 22–32)
CREATININE: 0.67 mg/dL (ref 0.61–1.24)
Chloride: 96 mmol/L — ABNORMAL LOW (ref 101–111)
Glucose, Bld: 106 mg/dL — ABNORMAL HIGH (ref 65–99)
Potassium: 3.7 mmol/L (ref 3.5–5.1)
Sodium: 132 mmol/L — ABNORMAL LOW (ref 135–145)
TOTAL PROTEIN: 6.3 g/dL — AB (ref 6.5–8.1)

## 2016-08-27 LAB — TROPONIN I

## 2016-08-27 LAB — CBC
HEMATOCRIT: 28.6 % — AB (ref 40.0–52.0)
Hemoglobin: 9.6 g/dL — ABNORMAL LOW (ref 13.0–18.0)
MCH: 28.2 pg (ref 26.0–34.0)
MCHC: 33.7 g/dL (ref 32.0–36.0)
MCV: 83.6 fL (ref 80.0–100.0)
Platelets: 351 10*3/uL (ref 150–440)
RBC: 3.42 MIL/uL — AB (ref 4.40–5.90)
RDW: 23.1 % — ABNORMAL HIGH (ref 11.5–14.5)
WBC: 8.1 10*3/uL (ref 3.8–10.6)

## 2016-08-27 MED ORDER — HEPARIN SOD (PORK) LOCK FLUSH 100 UNIT/ML IV SOLN
INTRAVENOUS | Status: AC
  Administered 2016-08-27: 500 [IU]
  Filled 2016-08-27: qty 5

## 2016-08-27 MED ORDER — SODIUM CHLORIDE 0.9 % IV BOLUS (SEPSIS)
1000.0000 mL | Freq: Once | INTRAVENOUS | Status: AC
  Administered 2016-08-27: 1000 mL via INTRAVENOUS

## 2016-08-27 NOTE — ED Provider Notes (Signed)
[facility] Emergency Department Provider Note    First MD Initiated Contact with Patient 08/27/16 0230     (approximate)  I have reviewed the triage vital signs and the nursing notes.   HISTORY  Chief Complaint Fall   HPI [name] is a 76 y.o. male with below list of chronic medical conditions presents to the emergency department via EMS from [place] with history of fall tonight. Patient states that his left leg "gave out resulting in to his fall. Patient does admit to striking the occipital portion of his head unsure if he lost consciousness. Patient also admits to chronic neck pain but with acute worsening tonight. Patient current pain score is 5 out of 10. Patient denies any symptoms at present other than thirst. Patient states that this has occurred to him before and at that time he is dehydrated.   Past Medical History:  Diagnosis Date  . AAA (abdominal aortic aneurysm) (HCC)    4.2 cm IR AAA noted on 01/13/15 PET scan  . Anxiety   . CAD (coronary artery disease)    AMI 1997 w/ BMS x 2 LAD, BMS CFX 2002, DES x 2 LAD 2009, DES CFX & OM 2012   . Carotid artery disease ([place])    RICA CEA 1607, LICA CEA 3710 per Dr. [name],   . COPD (chronic obstructive pulmonary disease) ([place])   . GERD (gastroesophageal reflux disease)    occ  . HTN (hypertension)   . Hydradenitis   . Hyperlipidemia   . Ischemic cardiomyopathy    EF 40-45 percent at cath 2012  . Myocardial infarction ([place])   . Non-small cell lung cancer (NSCLC) ([place]) dx'd 12/2014   SBRT  . Osteoarthritis    pt. denies 01/17/2015  . PAC (premature atrial contraction)   . Peripheral vascular disease ([place])   . Pneumonia   . Prostate cancer ([place]) dx'd approx 2008   xrt throughfoley  . Radiation 02/18/15-02/25/15   right upper lung 54 Gy  . Shortness of breath dyspnea   . Stroke [facility]) 11-24-11   "no feeling in right 5th digit"  . Urinary frequency   . Urinary urgency     Patient Active  Problem List   Diagnosis Date Noted  . Metastatic cancer ([place]) 08/23/2016  . Pneumothorax 08/18/2016  . Pneumothorax, left   . Dehydration 08/11/2016  . Low magnesium level 08/11/2016  . Pneumonia 08/07/2016  . COPD exacerbation ([place]) 08/04/2016  . Port catheter in place 04/06/2016  . 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 Dysphagia 02/04/2016  . Left arm weakness 12/19/2015  . Numbness and tingling in left arm 12/19/2015  . Left shoulder pain 12/19/2015  . History of recent fall 12/19/2015  . Paresthesia of arm 12/01/2015  . Depression 12/01/2015  . Malnutrition of moderate degree 11/20/2015  . TIA (transient ischemic attack) 11/19/2015  . Weakness 11/17/2015  . Physical deconditioning 11/10/2015  . Multiple rib fractures 11/10/2015  . Chronic systolic heart failure ([place]) 11/02/2015  . Atrial flutter ([place]) 10/30/2015  . B12 deficiency 07/22/2015  . Anemia 07/15/2015  . Hematuria 07/08/2015  . Unintentional weight loss 07/08/2015  . Hypotension 07/08/2015  . 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 Cough 05/22/2015  . Tobacco user 05/22/2015  . Mediastinal lymphadenopathy 01/22/2015  . 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 S/P bronchoscopy with biopsy   . Colon cancer screening 11/06/2014  . Encounter for Medicare annual wellness exam  11/06/2014  . Routine general medical examination at a health care facility 11/06/2014  . Lung nodule 06/28/2014  . Carotid stenosis 12/20/2013  . Goals of care, counseling/discussion 12/20/2013  . Aftercare following surgery of the circulatory system, [place] 12/19/2012  . Personal history of colonic polyps 08/28/2012  . Hyperglycemia 08/07/2012  . Pre-operative cardiovascular examination 11/03/2011  . Carotid artery disease ([place]) 05/20/2011  . Grief reaction 05/07/2011  . Ischemic cardiomyopathy 01/13/2011  . Palpitations 11/10/2010  . Occlusion and stenosis of carotid artery without mention of cerebral  infarction 04/02/2010  . Anxiety disorder 12/12/2009  . HYPERTENSION, BENIGN 09/29/2008  . History of prostate cancer 09/19/2008  . Hyperlipidemia 02/10/2007  . AMAUROSIS FUGAX 02/10/2007  . MYOCARDIAL INFARCTION, HX OF 02/10/2007  . Coronary atherosclerosis 02/10/2007  . PERIPHERAL VASCULAR DISEASE 02/10/2007  . HEMORRHOIDS 02/10/2007  . Chronic obstructive pulmonary disease ([place]) 02/10/2007  . OSTEOARTHRITIS 02/10/2007  . Nicotine dependence 02/10/2007  . Pure hypercholesterolemia 01/27/2007  . CORONARY ATHEROSCLEROSIS NATIVE CORONARY ARTERY 01/27/2007    Past Surgical History:  Procedure Laterality Date  . CARDIAC CATHETERIZATION    . CARDIOVASCULAR STRESS TEST  12-2006  . CARDIOVERSION N/A 10/31/2015   Procedure: CARDIOVERSION;  Surgeon: Sueanne Margarita, MD;  Location: [place];  Service: Cardiovascular;  Laterality: N/A;  . CAROTID ENDARTERECTOMY Right 03-2000   CE  . CAROTID ENDARTERECTOMY Left 11-24-11   CE  . COLONOSCOPY  04-2001   polyp  . [place], 2002, 2009, 2012   8 stents total  . [place]   neg  . ENDARTERECTOMY  11/24/2011   Procedure: ENDARTERECTOMY CAROTID;  Surgeon: Rosetta Posner, MD;  Location: [place];  Service: Vascular;  Laterality: Left;  . EYE SURGERY Bilateral Aug. 17, 2015   Cataract  . FUDUCIAL PLACEMENT N/A 12/25/2014   Procedure: PLACEMENT OF FUDUCIAL;  Surgeon: Collene Gobble, MD;  Location: [place];  Service: Thoracic;  Laterality: N/A;  . IR GENERIC HISTORICAL  03/18/2016   IR US GUIDE VASC ACCESS LEFT 03/18/2016 Jacqulynn Cadet, MD [facility]-INTERV RAD  . IR GENERIC HISTORICAL  03/18/2016   IR FLUORO GUIDE PORT INSERTION LEFT 03/18/2016 Jacqulynn Cadet, MD [facility]-INTERV RAD  . PROSTATE SURGERY     Radiation Tx  X's 40  . TEE WITHOUT CARDIOVERSION N/A 10/31/2015   Procedure: TRANSESOPHAGEAL ECHOCARDIOGRAM (TEE);  Surgeon: Sueanne Margarita, MD;  [facility] ENDOSCOPY;  Service: Cardiovascular;  Laterality: N/A;  .  VASCULAR SURGERY    . VIDEO BRONCHOSCOPY WITH ENDOBRONCHIAL NAVIGATION N/A 12/25/2014   Procedure: VIDEO BRONCHOSCOPY WITH ENDOBRONCHIAL NAVIGATION  with Biopsies and Brushings;  Surgeon: Collene Gobble, MD;  Location: [place];  Service: Thoracic;  Laterality: N/A;  . VIDEO BRONCHOSCOPY WITH ENDOBRONCHIAL ULTRASOUND N/A 01/22/2015   Procedure: VIDEO BRONCHOSCOPY WITH ENDOBRONCHIAL ULTRASOUND with Biopsies;  Surgeon: Collene Gobble, MD;  Location: [place];  Service: Thoracic;  Laterality: N/A;    Prior to Admission medications   Medication Sig Start Date End Date Taking? Authorizing Provider  acetaminophen (TYLENOL) 500 MG tablet Take 1,000 mg by mouth every 4 (four) hours as needed for mild pain, moderate pain, fever or headache.  10/17/15   [provider]  ALPRAZolam Duanne Moron) 0.5 MG tablet Take 0.5-1 tablets (0.25-0.5 mg total) by mouth daily as needed for anxiety or sleep. 08/20/16   Gouru, Illene Silver, MD  bisacodyl (DULCOLAX) 5 MG EC tablet Take 1 tablet (5 mg total) by mouth daily as needed for moderate constipation. 08/20/16  Nicholes Mango, MD  clopidogrel (PLAVIX) 75 MG tablet TAKE 1 TABLET BY MOUTH EVERY DAY 06/07/16   Burnell Blanks, MD  diphenoxylate-atropine (LOMOTIL) 2.5-0.025 MG tablet Take 1-2 tablets by mouth 4 (four) times daily as needed for diarrhea or loose stools. 06/15/16   Owens Shark, NP  fludrocortisone (FLORINEF) 0.1 MG tablet Take 1 tablet (0.1 mg total) by mouth daily. 08/05/16   Tower, Wynelle Fanny, MD  fluticasone-salmeterol (ADVAIR HFA) 992-42 MCG/ACT inhaler Inhale 2 puffs into the lungs 2 (two) times daily.    [provider]  ipratropium-albuterol (DUONEB) 0.5-2.5 (3) MG/3ML SOLN Take 3 mLs by nebulization every 4 (four) hours as needed (shortness of breath and wheezing).    [provider]  lidocaine (XYLOCAINE) 2 % solution Use as directed 20 mLs in the mouth or throat as needed for mouth pain.    [provider]  lidocaine-prilocaine (EMLA)  cream Apply to port site one hour prior to use. Do not rub in. Cover with plastic. 07/07/16   Ladell Pier, MD  loratadine (CLARITIN) 10 MG tablet TAKE 1 TABLET BY MOUTH EVERY DAY 07/22/16   Collene Gobble, MD  mirtazapine (REMERON) 15 MG tablet TAKE 1 TABLET (15 MG TOTAL) BY MOUTH AT BEDTIME. 07/05/16   Tower, Wynelle Fanny, MD  Multiple Vitamin (MULTIVITAMIN WITH MINERALS) TABS tablet Take 1 tablet by mouth daily.    [provider]  nitroGLYCERIN (NITROSTAT) 0.4 MG SL tablet Place 0.4 mg under the tongue every 5 (five) minutes as needed for chest pain.    [provider]  omeprazole (PRILOSEC) 40 MG capsule TAKE 1 CAPSULE 30 MINUTES BEFORE BREAKFAST AND DINNER 08/04/16   Ladene Artist, MD  potassium chloride 20 MEQ/15ML (10%) SOLN Take 15 mLs (20 mEq total) by mouth daily. 08/06/16   Owens Shark, NP  predniSONE (DELTASONE) 20 MG tablet Take 0.5 tablets (10 mg total) by mouth every other day. Please continue after you take 50 mg prednisone for 5 days To stimulate appetite Patient not taking: Reported on 08/17/2016 08/05/16   Bettey Costa, MD  prochlorperazine (COMPAZINE) 5 MG tablet Take 1 tablet (5 mg total) by mouth every 6 (six) hours as needed for nausea or vomiting. 07/27/16   Ladell Pier, MD  tiotropium (SPIRIVA) 18 MCG inhalation capsule Place 1 capsule (18 mcg total) into inhaler and inhale daily. 02/24/16   Parrett, Fonnie Mu, NP  traMADol (ULTRAM) 50 MG tablet Take 1 tablet (50 mg total) by mouth every 6 (six) hours as needed. 08/20/16   Gouru, Illene Silver, MD  VENTOLIN HFA 108 (90 Base) MCG/ACT inhaler INHALE 2 PUFFS INTO THE LUNGS EVERY 4 (FOUR) HOURS AS NEEDED FOR WHEEZING OR SHORTNESS OF BREATH. 05/12/16   Tower, Wynelle Fanny, MD    Allergies Augmentin [amoxicillin-pot clavulanate]; Chantix [varenicline]; Sulfonamide derivatives; and Zantac [ranitidine hcl]  Family History  Problem Relation Age of Onset  . Stroke Father   . Heart disease Father   . Heart disease Mother         pacemaker  . Other Brother        benign brain tumor  . Cancer Brother        Eye-behind  . Cancer Sister        Breast  . Liver cancer Brother   . Diabetes Maternal Grandmother   . Colon polyps Neg Hx   . Esophageal cancer Neg Hx     Social History Social History  Substance Use Topics  .  Smoking status: Current Every Day Smoker    Packs/day: 1.00    Years: 55.00    Types: Cigarettes  . Smokeless tobacco: Never Used     Comment: 1/2 ppd (01/07/15)  . Alcohol use 0.0 oz/week     Comment: occ    Review of Systems Constitutional: No fever/chills Eyes: No visual changes. ENT: No sore throat. Cardiovascular: Denies chest pain. Respiratory: Denies shortness of breath. Gastrointestinal: No abdominal pain.  No nausea, no vomiting.  No diarrhea.  No constipation. Genitourinary: Negative for dysuria. Musculoskeletal: Negative for neck pain.  Negative for back pain. Positive for neck pain and low back pain Integumentary: Negative for rash. Positive for occipital abrasion Neurological: Negative for headaches, focal weakness or numbness.   ____________________________________________   PHYSICAL EXAM:  VITAL SIGNS: ED Triage Vitals  Enc Vitals Group     BP 08/27/16 0234 (!) 142/93     Pulse Rate 08/27/16 0234 77     Resp 08/27/16 0234 18     Temp 08/27/16 0234 97.8 F (36.6 C)     Temp Source 08/27/16 0234 Oral     SpO2 08/27/16 0234 95 %     Weight 08/27/16 0236 54 kg (119 lb)     Height 08/27/16 0236 1.727 m (5\' 8" )     Head Circumference --      Peak Flow --      Pain Score 08/27/16 0231 0     Pain Loc --      Pain Edu? --      Excl. in [place]? --     Constitutional: Alert and oriented. Well appearing and in no acute distress. Eyes: Conjunctivae are normal. PERRL. EOMI. Head: Occipital abrasion Mouth/Throat: Mucous membranes are Dry.  Oropharynx non-erythematous. Neck: No stridor.  No meningeal signs.  No cervical spine tenderness to palpation. Cardiovascular:  Normal rate, regular rhythm. Good peripheral circulation. Grossly normal heart sounds. Respiratory: Normal respiratory effort.  No retractions. Lungs CTAB. Gastrointestinal: Soft and nontender. No distention.  Musculoskeletal: No lower extremity tenderness nor edema. No gross deformities of extremities. Neurologic:  Normal speech and language. No gross focal neurologic deficits are appreciated.  Skin:  Skin is warm, dry. Positive occipital abrasion. No rash noted. Psychiatric: Mood and affect are normal. Speech and behavior are normal.  ____________________________________________   LABS (all labs ordered are listed, but only abnormal results are displayed)  Labs Reviewed  CBC - Abnormal; Notable for the following:       Result Value   RBC 3.42 (*)    Hemoglobin 9.6 (*)    HCT 28.6 (*)    RDW 23.1 (*)    All other components within normal limits  COMPREHENSIVE METABOLIC PANEL - Abnormal; Notable for the following:    Sodium 132 (*)    Chloride 96 (*)    Glucose, Bld 106 (*)    Total Protein 6.3 (*)    Albumin 3.1 (*)    ALT 12 (*)    All other components within normal limits  TROPONIN I   ____________________________________________  EKG  ED ECG REPORT I, [place] Amaziah Ghosh, the attending physician, personally viewed and interpreted this ECG.   Date: 08/27/2016  EKG Time: 2:33 AM  Rate: 80  Rhythm: Normal sinus rhythm with PVC  Axis: Normal  Intervals: Normal  ST&T Change: None ____________________________________________  RADIOLOGY I, [place] N Lucianna Ostlund, personally viewed and evaluated these images (plain radiographs) as part of my medical decision making, as well as reviewing the written report  by the radiologist.  Ct Head Wo Contrast  Result Date: 08/27/2016 CLINICAL DATA:  Fall with head injury.  On Plavix. EXAM: CT HEAD WITHOUT CONTRAST CT CERVICAL SPINE WITHOUT CONTRAST TECHNIQUE: Multidetector CT imaging of the head and cervical spine was performed following the  standard protocol without intravenous contrast. Multiplanar CT image reconstructions of the cervical spine were also generated. COMPARISON:  Head and cervical spine CT 11/21/2015 FINDINGS: CT HEAD FINDINGS Brain: No evidence of acute infarction, hemorrhage, hydrocephalus, extra-axial collection or mass lesion/mass effect. Generalized atrophy and chronic small vessel ischemia. Mall encephalomalacia in the posterior right frontal lobe, new from prior exam. Vascular: Atherosclerosis of skullbase vasculature without hyperdense vessel or abnormal calcification. Skull: No skull fracture. Skullbase lesions better seen on cervical spine CT. Sinuses/Orbits: Mucosal thickening of right maxillary sinus. Scattered opacification of right mastoid air cells. Other: None. CT CERVICAL SPINE FINDINGS Alignment: Normal. Skull base and vertebrae: Osseous metastatic disease with lytic lesions within C7 and T2 vertebral bodies. T2 vertebral body lesion appears similar to chest CT 08/17/2016. C7 lesion is grossly similar to prior chest CT, only partially included on the prior exam. Probable lytic lesion within C6 vertebral body. Lesion within the right posterior aspect of C1 has a soft tissue component extending posteriorly. There are lytic skullbase lesions. Lytic lesion within left transverse process of C4 has soft tissue component. No definite acute fracture. Soft tissues and spinal canal: No prevertebral fluid or swelling. No visible canal hematoma. No definite osseous extension into the spinal canal. Disc levels:  Disc space narrowing and endplate spurring throughout. Upper chest: Emphysema and biapical pleuroparenchymal scarring. Expansile lesion in left anterior first rib. Other: None. IMPRESSION: 1. No acute intracranial abnormality. No evidence of skull fracture. 2. No fracture or subluxation in the cervical spine. 3. 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 Lesions within T2  and C7 vertebral bodies are grossly similar from recent chest CT. Lesions within C6, posterior arch of C1 and posterior elements of C4 are new from most recent comparison CT of 11/21/2015. Electronically Signed   By: Jeb Levering M.D.   On: 08/27/2016 04:07   Ct Cervical Spine Wo Contrast  Result Date: 08/27/2016 CLINICAL DATA:  Fall with head injury.  On Plavix. EXAM: CT HEAD WITHOUT CONTRAST CT CERVICAL SPINE WITHOUT CONTRAST TECHNIQUE: Multidetector CT imaging of the head and cervical spine was performed following the standard protocol without intravenous contrast. Multiplanar CT image reconstructions of the cervical spine were also generated. COMPARISON:  Head and cervical spine CT 11/21/2015 FINDINGS: CT HEAD FINDINGS Brain: No evidence of acute infarction, hemorrhage, hydrocephalus, extra-axial collection or mass lesion/mass effect. Generalized atrophy and chronic small vessel ischemia. Mall encephalomalacia in the posterior right frontal lobe, new from prior exam. Vascular: Atherosclerosis of skullbase vasculature without hyperdense vessel or abnormal calcification. Skull: No skull fracture. Skullbase lesions better seen on cervical spine CT. Sinuses/Orbits: Mucosal thickening of right maxillary sinus. Scattered opacification of right mastoid air cells. Other: None. CT CERVICAL SPINE FINDINGS Alignment: Normal. Skull base and vertebrae: Osseous metastatic disease with lytic lesions within C7 and T2 vertebral bodies. T2 vertebral body lesion appears similar to chest CT 08/17/2016. C7 lesion is grossly similar to prior chest CT, only partially included on the prior exam. Probable lytic lesion within C6 vertebral body. Lesion within the right posterior aspect of C1 has a soft tissue component extending posteriorly. There are lytic skullbase lesions. Lytic lesion within left transverse process of C4 has soft tissue  component. No definite acute fracture. Soft tissues and spinal canal: No prevertebral fluid  or swelling. No visible canal hematoma. No definite osseous extension into the spinal canal. Disc levels:  Disc space narrowing and endplate spurring throughout. Upper chest: Emphysema and biapical pleuroparenchymal scarring. Expansile lesion in left anterior first rib. Other: None. IMPRESSION: 1. No acute intracranial abnormality. No evidence of skull fracture. 2. No fracture or subluxation in the cervical spine. 3. 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 Lesions within T2 and C7 vertebral bodies are grossly similar from recent chest CT. Lesions within C6, posterior arch of C1 and posterior elements of C4 are new from most recent comparison CT of 11/21/2015. Electronically Signed   By: Jeb Levering M.D.   On: 08/27/2016 04:07      Procedures   ____________________________________________   INITIAL IMPRESSION / ASSESSMENT AND PLAN / ED COURSE  Pertinent labs & imaging results that were available during my care of the patient were reviewed by me and considered in my medical decision making (see chart for details).        ____________________________________________  FINAL CLINICAL IMPRESSION(S) / ED DIAGNOSES  Final diagnoses:  Metastatic carcinoma ([place])  Dehydration     MEDICATIONS GIVEN DURING THIS VISIT:  Medications  sodium chloride 0.9 % bolus 1,000 mL (1,000 mLs Intravenous New Bag/Given 08/27/16 0301)     NEW OUTPATIENT MEDICATIONS STARTED DURING THIS VISIT:  New Prescriptions   No medications on file    Modified Medications   No medications on file    Discontinued Medications   No medications on file     Note:  This document was prepared using Dragon voice recognition software and may include unintentional dictation errors.    Gregor Hams, MD 08/27/16 [phone]

## 2016-08-27 NOTE — ED Triage Notes (Signed)
Pt to rm 8 via EMS from [facility], report fall when left leg gave out, report hx of similar, was dehydrated last time.  Abrasion to back of head.

## 2016-08-27 NOTE — ED Notes (Signed)

## 2016-08-27 NOTE — ED Notes (Signed)
Pt taken to CT.

## 2016-08-27 NOTE — Patient Instructions (Signed)
Please give us a call in case you have any questions or concerns.  

## 2016-08-27 NOTE — Progress Notes (Signed)
  Patient ID: [name], male   DOB: 27-Oct-1940, 76 y.o.   MRN: [id number]  HISTORY: He returns today in follow-up. He is accompanied today by his daughter. She had to bring him to the emergency room because he fell and complained of some back pain. A workup in the emergency room was unremarkable although he does have widespread metastatic disease. He does not have any shortness of breath.   Vitals:   08/27/16 0828  BP: 109/71  Pulse: 94  Temp: 97.3 F (36.3 C)     EXAM:    Resp: Lungs are Equal bilaterally but with coarse rales and rhonchi throughout.  No respiratory distress, normal effort. Heart:  Regular without murmurs Abd:  Abdomen is soft, non distended and non tender. No masses are palpable.  There is no rebound and no guarding.  Neurological: Alert and oriented to person, place, and time. Coordination normal.  Skin: Skin is warm and dry. No rash noted. No diaphoretic. No erythema. No pallor.  Psychiatric: Normal mood and affect. Normal behavior. Judgment and thought content normal.    ASSESSMENT: I have independently reviewed the patient's chest x-ray from today. There is no sign of a pneumothorax on it.   PLAN:   He will follow-up with [place] as needed. He was instructed to follow-up with his pulmonologist regarding his recurrent lung problems.    Nestor Lewandowsky, MD

## 2016-08-29 ENCOUNTER — Other Ambulatory Visit: Payer: Self-pay | Admitting: Nurse Practitioner

## 2016-08-29 DIAGNOSIS — C155 Malignant neoplasm of lower third of esophagus: Secondary | ICD-10-CM

## 2016-08-29 DIAGNOSIS — E876 Hypokalemia: Secondary | ICD-10-CM

## 2016-08-31 ENCOUNTER — Other Ambulatory Visit (HOSPITAL_BASED_OUTPATIENT_CLINIC_OR_DEPARTMENT_OTHER): Payer: Medicare Other

## 2016-08-31 ENCOUNTER — Ambulatory Visit: Payer: Medicare Other

## 2016-08-31 ENCOUNTER — Telehealth: Payer: Self-pay | Admitting: Oncology

## 2016-08-31 ENCOUNTER — Ambulatory Visit (HOSPITAL_BASED_OUTPATIENT_CLINIC_OR_DEPARTMENT_OTHER): Payer: Medicare Other | Admitting: Nurse Practitioner

## 2016-08-31 VITALS — BP 91/54 | HR 93 | Temp 98.7°F | Resp 18 | Ht 68.0 in | Wt 115.8 lb

## 2016-08-31 DIAGNOSIS — Z95828 Presence of other vascular implants and grafts: Secondary | ICD-10-CM

## 2016-08-31 DIAGNOSIS — C155 Malignant neoplasm of lower third of esophagus: Secondary | ICD-10-CM

## 2016-08-31 DIAGNOSIS — Z85118 Personal history of other malignant neoplasm of bronchus and lung: Secondary | ICD-10-CM | POA: Diagnosis not present

## 2016-08-31 DIAGNOSIS — C7951 Secondary malignant neoplasm of bone: Secondary | ICD-10-CM | POA: Diagnosis not present

## 2016-08-31 DIAGNOSIS — Z452 Encounter for adjustment and management of vascular access device: Secondary | ICD-10-CM | POA: Diagnosis not present

## 2016-08-31 DIAGNOSIS — Z8546 Personal history of malignant neoplasm of prostate: Secondary | ICD-10-CM | POA: Diagnosis not present

## 2016-08-31 LAB — CBC WITH DIFFERENTIAL/PLATELET
BASO%: 0.5 % (ref 0.0–2.0)
Basophils Absolute: 0 10*3/uL (ref 0.0–0.1)
EOS ABS: 0.1 10*3/uL (ref 0.0–0.5)
EOS%: 2 % (ref 0.0–7.0)
HCT: 28 % — ABNORMAL LOW (ref 38.4–49.9)
HEMOGLOBIN: 9.2 g/dL — AB (ref 13.0–17.1)
LYMPH%: 12.5 % — AB (ref 14.0–49.0)
MCH: 28 pg (ref 27.2–33.4)
MCHC: 32.9 g/dL (ref 32.0–36.0)
MCV: 85.2 fL (ref 79.3–98.0)
MONO#: 0.6 10*3/uL (ref 0.1–0.9)
MONO%: 8.1 % (ref 0.0–14.0)
NEUT%: 76.9 % — ABNORMAL HIGH (ref 39.0–75.0)
NEUTROS ABS: 5.5 10*3/uL (ref 1.5–6.5)
PLATELETS: 371 10*3/uL (ref 140–400)
RBC: 3.29 10*6/uL — ABNORMAL LOW (ref 4.20–5.82)
RDW: 23 % — AB (ref 11.0–14.6)
WBC: 7.1 10*3/uL (ref 4.0–10.3)
lymph#: 0.9 10*3/uL (ref 0.9–3.3)

## 2016-08-31 LAB — COMPREHENSIVE METABOLIC PANEL
ALBUMIN: 2.7 g/dL — AB (ref 3.5–5.0)
ALT: 7 U/L (ref 0–55)
AST: 12 U/L (ref 5–34)
Alkaline Phosphatase: 109 U/L (ref 40–150)
Anion Gap: 7 mEq/L (ref 3–11)
BILIRUBIN TOTAL: 0.4 mg/dL (ref 0.20–1.20)
BUN: 16.2 mg/dL (ref 7.0–26.0)
CO2: 29 mEq/L (ref 22–29)
Calcium: 9.4 mg/dL (ref 8.4–10.4)
Chloride: 98 mEq/L (ref 98–109)
Creatinine: 0.7 mg/dL (ref 0.7–1.3)
Glucose: 108 mg/dl (ref 70–140)
Potassium: 3.8 mEq/L (ref 3.5–5.1)
Sodium: 134 mEq/L — ABNORMAL LOW (ref 136–145)
TOTAL PROTEIN: 6.3 g/dL — AB (ref 6.4–8.3)

## 2016-08-31 MED ORDER — HEPARIN SOD (PORK) LOCK FLUSH 100 UNIT/ML IV SOLN
500.0000 [IU] | Freq: Once | INTRAVENOUS | Status: AC | PRN
  Administered 2016-08-31: 500 [IU] via INTRAVENOUS
  Filled 2016-08-31: qty 5

## 2016-08-31 MED ORDER — SODIUM CHLORIDE 0.9% FLUSH
10.0000 mL | INTRAVENOUS | Status: DC | PRN
  Administered 2016-08-31: 10 mL via INTRAVENOUS
  Filled 2016-08-31: qty 10

## 2016-08-31 NOTE — Telephone Encounter (Signed)
Gave patient/dtr avs report and appointments for June.

## 2016-08-31 NOTE — Progress Notes (Addendum)
[facility] OFFICE PROGRESS NOTE   Diagnosis:  Esophagus cancer  INTERVAL HISTORY:   [name] returns for follow-up. He was last treated with Taxol 07/27/2016. He was hospitalized 08/04/2016 through 08/05/2016 with a COPD exacerbation. He was hospitalized again 08/07/2016 through 08/09/2016 with dehydration. He was hospitalized 08/17/2016 through 08/20/2016 with dyspnea, bilateral pneumothorax. A left-sided chest tube was placed. The chest tube was removed at time of discharge. He was seen in the emergency department 08/27/2016 with a fall.  He is currently residing at a nursing facility. He is not able to ambulate. He is working with physical therapy. Appetite is poor. He complains of low back pain. He reports tramadol is effective for the pain. He denies shortness of breath. His daughter has noted less wheezing. She attributes this to him quitting smoking.  Objective:  Vital signs in last 24 hours:  Blood pressure (!) 91/54, pulse 93, temperature 98.7 F (37.1 C), temperature source Oral, resp. rate 18, height 5\' 8"  (1.727 m), weight 115 lb 12.8 oz (52.5 kg), SpO2 97 %.    HEENT: No thrush or ulcers. Resp: Lungs with bilateral rhonchi. No respiratory distress. Cardio: Regular rate and rhythm. GI: Abdomen soft and nontender. Vascular: No leg edema. Neuro: Weakness with dorsiflexion right foot. Leg strength is otherwise intact.  Skin: Large ecchymosis dorsum of the left hand.  Port-A-Cath without erythema.  Lab Results:  Lab Results  Component Value Date   WBC 7.1 08/31/2016   HGB 9.2 (L) 08/31/2016   HCT 28.0 (L) 08/31/2016   MCV 85.2 08/31/2016   PLT 371 08/31/2016   NEUTROABS 5.5 08/31/2016    Imaging:  No results found.  Medications: I have reviewed the patient's current medications.  Assessment/Plan: 1.Adenocarcinoma the esophagus  upper endoscopy 02/17/2016 confirmed a distal esophagus mass  CT chest 01/30/2016-increased in size of bilateral  lung nodules  CT abdomen/pelvis 02/23/2016 distal esophagus mass, gastrohepatic adenopathy, iliac and probable thoracic and lumbar metastases, 1.7 cm aortocaval node, nonspecific lung nodules  Biopsy of a left iliac lesion 03/03/2016 confirmed metastatic adenocarcinoma, TTF-1, cytokeratin 7, cytokeratin 20, and CDX-2positive; morphology similar to the distal esophagus biopsy  Cycle 1 FOLFOX 03/24/2016  Cycle 2 FOLFOX 04/06/2016  Restaging CTs 04/27/2016-slight interval progression of pulmonary metastatic disease; progressive osseous metastatic disease. Enlarging left femoral greater trochanter lesion could be at risk for pathologic fracture.  Weekly Taxol initiated 05/04/2016  Restaging CTs 07/21/2016-unchanged pulmonary nodules, one right upper lobe nodule has decreased in size, stable gastrohepatic node, increase in soft tissue component at the left iliac and L2 metastases  Cycle 4 weekly Taxol initiated 07/27/2016  2. 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 Prostate cancer treated with external beam radiation  4. COPD  5. Coronary artery disease  6. 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 03/18/2016 Port-A-Cath placement  9. Palliative radiation to the left scapula and left proximal femur 05/03/2016-05/07/2016  10. Diarrhea-etiology unclear  11. History of Hypokalemia secondary to diarrhea  12. History ofRight footdrop-likely secondary to weight loss and peripheral nerve compression, persistent    Disposition: [name] was last treated with chemotherapy 07/27/2016. He has been hospitalized multiple times since then. He is currently residing at a nursing facility. His performance status is poor. He and his family understand that in his current condition he is not a candidate for  chemotherapy.  He has had several CT  scans related to the hospitalizations. He and his family understand there is evidence of disease progression on the scans.  Dr. Benay Spice recommends a supportive care approach with hospice involvement. [name] is undecided. He would like to try to increase his activity and oral intake and return for a follow-up visit in 2 weeks to reevaluate.  He will return for his next appointment on 09/14/2016. He will contact the office in the interim with any problems.  Patient seen with Dr. Benay Spice. 25 minutes were spent face-to-face at today's visit with the majority of that time involved in counseling/coordination of care.    Ned Card ANP/GNP-BC   08/31/2016  11:41 AM  This was shared visit with Ned Card. [name] has been hospitalized repeatedly over the past month. He has a poor performance status. I discussed the prognosis and treatment options with [name] and his family I recommend Hospice care. He does not wish to begin Hospice care at present. He will return for an office visit and further discussion in 2 weeks.  Julieanne Manson, M.D.

## 2016-08-31 NOTE — Patient Instructions (Signed)
Implanted Port Home Guide An implanted port is a type of central line that is placed under the skin. Central lines are used to provide IV access when treatment or nutrition needs to be given through a person's veins. Implanted ports are used for long-term IV access. An implanted port may be placed because:  You need IV medicine that would be irritating to the small veins in your hands or arms.  You need long-term IV medicines, such as antibiotics.  You need IV nutrition for a long period.  You need frequent blood draws for lab tests.  You need dialysis.  Implanted ports are usually placed in the chest area, but they can also be placed in the upper arm, the abdomen, or the leg. An implanted port has two main parts:  Reservoir. The reservoir is round and will appear as a small, raised area under your skin. The reservoir is the part where a needle is inserted to give medicines or draw blood.  Catheter. The catheter is a thin, flexible tube that extends from the reservoir. The catheter is placed into a large vein. Medicine that is inserted into the reservoir goes into the catheter and then into the vein.  How will I care for my incision site? Do not get the incision site wet. Bathe or shower as directed by your health care provider. How is my port accessed? Special steps must be taken to access the port:  Before the port is accessed, a numbing cream can be placed on the skin. This helps numb the skin over the port site.  Your health care provider uses a sterile technique to access the port. ? Your health care provider must put on a mask and sterile gloves. ? The skin over your port is cleaned carefully with an antiseptic and allowed to dry. ? The port is gently pinched between sterile gloves, and a needle is inserted into the port.  Only "non-coring" port needles should be used to access the port. Once the port is accessed, a blood return should be checked. This helps ensure that the port  is in the vein and is not clogged.  If your port needs to remain accessed for a constant infusion, a clear (transparent) bandage will be placed over the needle site. The bandage and needle will need to be changed every week, or as directed by your health care provider.  Keep the bandage covering the needle clean and dry. Do not get it wet. Follow your health care provider's instructions on how to take a shower or bath while the port is accessed.  If your port does not need to stay accessed, no bandage is needed over the port.  What is flushing? Flushing helps keep the port from getting clogged. Follow your health care provider's instructions on how and when to flush the port. Ports are usually flushed with saline solution or a medicine called heparin. The need for flushing will depend on how the port is used.  If the port is used for intermittent medicines or blood draws, the port will need to be flushed: ? After medicines have been given. ? After blood has been drawn. ? As part of routine maintenance.  If a constant infusion is running, the port may not need to be flushed.  How long will my port stay implanted? The port can stay in for as long as your health care provider thinks it is needed. When it is time for the port to come out, surgery will be   done to remove it. The procedure is similar to the one performed when the port was put in. When should I seek immediate medical care? When you have an implanted port, you should seek immediate medical care if:  You notice a bad smell coming from the incision site.  You have swelling, redness, or drainage at the incision site.  You have more swelling or pain at the port site or the surrounding area.  You have a fever that is not controlled with medicine.  This information is not intended to replace advice given to you by your health care provider. Make sure you discuss any questions you have with your health care provider. Document  Released: 03/08/2005 Document Revised: 08/14/2015 Document Reviewed: 11/13/2012 Elsevier Interactive Patient Education  2017 [employer]vier Inc.  

## 2016-08-31 NOTE — Telephone Encounter (Signed)
Per LT desk nurse 6/12 schedule message does not need to be executed.

## 2016-09-01 ENCOUNTER — Non-Acute Institutional Stay (SKILLED_NURSING_FACILITY): Payer: Medicare Other | Admitting: Gerontology

## 2016-09-01 DIAGNOSIS — M898X9 Other specified disorders of bone, unspecified site: Secondary | ICD-10-CM | POA: Diagnosis not present

## 2016-09-01 DIAGNOSIS — E2740 Unspecified adrenocortical insufficiency: Secondary | ICD-10-CM

## 2016-09-01 DIAGNOSIS — G893 Neoplasm related pain (acute) (chronic): Secondary | ICD-10-CM | POA: Diagnosis not present

## 2016-09-01 DIAGNOSIS — I951 Orthostatic hypotension: Secondary | ICD-10-CM

## 2016-09-07 ENCOUNTER — Ambulatory Visit: Payer: Medicare Other

## 2016-09-07 ENCOUNTER — Other Ambulatory Visit: Payer: Medicare Other

## 2016-09-09 ENCOUNTER — Non-Acute Institutional Stay (SKILLED_NURSING_FACILITY): Payer: Medicare Other | Admitting: Gerontology

## 2016-09-09 DIAGNOSIS — J811 Chronic pulmonary edema: Secondary | ICD-10-CM

## 2016-09-09 DIAGNOSIS — R45851 Suicidal ideations: Secondary | ICD-10-CM | POA: Diagnosis not present

## 2016-09-09 DIAGNOSIS — Z7189 Other specified counseling: Secondary | ICD-10-CM

## 2016-09-10 ENCOUNTER — Other Ambulatory Visit
Admission: RE | Admit: 2016-09-10 | Discharge: 2016-09-10 | Disposition: A | Discharge status: Home / Self Care | Payer: Medicare Other | Source: Skilled Nursing Facility | Attending: Gerontology | Admitting: Gerontology

## 2016-09-10 DIAGNOSIS — C155 Malignant neoplasm of lower third of esophagus: Secondary | ICD-10-CM | POA: Insufficient documentation

## 2016-09-10 LAB — COMPREHENSIVE METABOLIC PANEL
ALK PHOS: 139 U/L — AB (ref 38–126)
ALT: 17 U/L (ref 17–63)
AST: 25 U/L (ref 15–41)
Albumin: 3.5 g/dL (ref 3.5–5.0)
Anion gap: 8 (ref 5–15)
BUN: 23 mg/dL — AB (ref 6–20)
CALCIUM: 9 mg/dL (ref 8.9–10.3)
CO2: 27 mmol/L (ref 22–32)
CREATININE: 0.69 mg/dL (ref 0.61–1.24)
Chloride: 96 mmol/L — ABNORMAL LOW (ref 101–111)
Glucose, Bld: 103 mg/dL — ABNORMAL HIGH (ref 65–99)
Potassium: 4 mmol/L (ref 3.5–5.1)
Sodium: 131 mmol/L — ABNORMAL LOW (ref 135–145)
Total Bilirubin: 0.6 mg/dL (ref 0.3–1.2)
Total Protein: 7.1 g/dL (ref 6.5–8.1)

## 2016-09-10 LAB — CBC WITH DIFFERENTIAL/PLATELET
Basophils Absolute: 0 10*3/uL (ref 0–0.1)
Basophils Relative: 0 %
EOS PCT: 0 %
Eosinophils Absolute: 0 10*3/uL (ref 0–0.7)
HEMATOCRIT: 33.6 % — AB (ref 40.0–52.0)
Hemoglobin: 11.1 g/dL — ABNORMAL LOW (ref 13.0–18.0)
LYMPHS ABS: 0.5 10*3/uL — AB (ref 1.0–3.6)
LYMPHS PCT: 5 %
MCH: 28 pg (ref 26.0–34.0)
MCHC: 32.9 g/dL (ref 32.0–36.0)
MCV: 84.9 fL (ref 80.0–100.0)
Monocytes Absolute: 0.3 10*3/uL (ref 0.2–1.0)
Monocytes Relative: 3 %
NEUTROS ABS: 8.6 10*3/uL — AB (ref 1.4–6.5)
Neutrophils Relative %: 92 %
Platelets: 429 10*3/uL (ref 150–440)
RBC: 3.96 MIL/uL — AB (ref 4.40–5.90)
RDW: 22.2 % — ABNORMAL HIGH (ref 11.5–14.5)
WBC: 9.4 10*3/uL (ref 3.8–10.6)

## 2016-09-11 ENCOUNTER — Other Ambulatory Visit
Admission: RE | Admit: 2016-09-11 | Discharge: 2016-09-11 | Disposition: A | Discharge status: Home / Self Care | Payer: Medicare Other | Source: Skilled Nursing Facility | Attending: Gerontology | Admitting: Gerontology

## 2016-09-11 DIAGNOSIS — E871 Hypo-osmolality and hyponatremia: Secondary | ICD-10-CM | POA: Insufficient documentation

## 2016-09-11 DIAGNOSIS — D649 Anemia, unspecified: Secondary | ICD-10-CM | POA: Insufficient documentation

## 2016-09-11 LAB — BASIC METABOLIC PANEL
ANION GAP: 7 (ref 5–15)
BUN: 36 mg/dL — ABNORMAL HIGH (ref 6–20)
CO2: 28 mmol/L (ref 22–32)
Calcium: 8.5 mg/dL — ABNORMAL LOW (ref 8.9–10.3)
Chloride: 98 mmol/L — ABNORMAL LOW (ref 101–111)
Creatinine, Ser: 0.68 mg/dL (ref 0.61–1.24)
GFR calc Af Amer: >60 mL/min (ref >60)
GLUCOSE: 102 mg/dL — AB (ref 65–99)
POTASSIUM: 4 mmol/L (ref 3.5–5.1)
SODIUM: 133 mmol/L — AB (ref 135–145)

## 2016-09-14 ENCOUNTER — Ambulatory Visit: Payer: Medicare Other | Admitting: Nurse Practitioner

## 2016-09-14 ENCOUNTER — Telehealth: Payer: Self-pay | Admitting: Nurse Practitioner

## 2016-09-14 NOTE — Telephone Encounter (Signed)
Left message on home phone for return call to check status of patient.

## 2016-09-14 NOTE — Telephone Encounter (Signed)
Patient's daughter called and canceled his appointment today and said "she did not want to reschedule at this time"

## 2016-09-16 NOTE — Telephone Encounter (Signed)
Pt's daughter returned call, he is in rehab- will be discharged soon. Family is researching other facilities for him post discharge. She doesn't "expect for him to come back" to [facility].

## 2016-09-18 DIAGNOSIS — Z7401 Bed confinement status: Secondary | ICD-10-CM | POA: Diagnosis not present

## 2016-09-18 DIAGNOSIS — R6889 Other general symptoms and signs: Secondary | ICD-10-CM | POA: Diagnosis not present

## 2016-09-19 ENCOUNTER — Encounter
Admission: RE | Admit: 2016-09-19 | Discharge: 2016-09-19 | Disposition: A | Discharge status: Home / Self Care | Payer: Medicare Other | Source: Ambulatory Visit | Attending: Internal Medicine | Admitting: Internal Medicine

## 2016-09-19 DIAGNOSIS — G893 Neoplasm related pain (acute) (chronic): Secondary | ICD-10-CM | POA: Insufficient documentation

## 2016-09-19 DIAGNOSIS — M898X9 Other specified disorders of bone, unspecified site: Secondary | ICD-10-CM

## 2016-09-19 DIAGNOSIS — E2740 Unspecified adrenocortical insufficiency: Secondary | ICD-10-CM | POA: Insufficient documentation

## 2016-09-19 NOTE — Progress Notes (Signed)
Location:      Place of Service:  SNF (31) Provider:  Toni Arthurs, NP-C  Tower, Wynelle Fanny, MD  Patient Care Team: Tower, Wynelle Fanny, MD as PCP - General Rolan Bucco, MD as Consulting Physician (Urology) Burnell Blanks, MD as Consulting Physician (Cardiology)  Extended Emergency Contact Information Primary Emergency Contact: Nolene Ebbs, [place] Phone: [phone] Work Phone: [phone] Mobile Phone: [phone] Relation: Daughter Secondary Emergency Contact: Ruthell Rumm[place] Phone: [phone] Relation: Son  Code Status:  FULL Goals of care: Advanced Directive information Advanced Directives 08/27/2016  Does Patient Have a Medical Advance Directive? Yes  Type of Paramedic of [place];Living will  Does patient want to make changes to medical advance directive? No - Patient declined  Copy of [place] in Chart? No - copy requested  Would patient like information on creating a medical advance directive? -     Chief Complaint  Patient presents with  . Acute Visit    HPI:  Pt is a 76 y.o. male seen today for an acute visit for Orthostatic hypotension. Notified by therapy that pt was not able to fully participate in rehab due to dizziness with standing. PT obtained vitals. Pt did display significant decrease in BP. Pt also is c/o worsening pain in the back. Pt reports pain is severe, ache. Sharp at times. 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 Pt denies syncope, vision changes. Pt denies n/v/d/f/c/cp/sob/ha/abd pain/cough. Pt does not require oxygen. Left chest port, not accessed. Decreased appetite/ decreased po intake. Spoke with daughter, Magda Paganini via telephone for 15 minutes. Explained findings and plan of care. Daughter verbalized agreement with plan.    Past Medical History:  Diagnosis Date  . AAA  (abdominal aortic aneurysm) (HCC)    4.2 cm IR AAA noted on 01/13/15 PET scan  . Anxiety   . CAD (coronary artery disease)    AMI 1997 w/ BMS x 2 LAD, BMS CFX 2002, DES x 2 LAD 2009, DES CFX & OM 2012   . Carotid artery disease ([place])    RICA CEA 4196, LICA CEA 2229 per Dr. [name],   . COPD (chronic obstructive pulmonary disease) ([place])   . GERD (gastroesophageal reflux disease)    occ  . HTN (hypertension)   . Hydradenitis   . Hyperlipidemia   . Ischemic cardiomyopathy    EF 40-45 percent at cath 2012  . Myocardial infarction ([place])   . Non-small cell lung cancer (NSCLC) ([place]) dx'd 12/2014   SBRT  . Osteoarthritis    pt. denies 01/17/2015  . PAC (premature atrial contraction)   . Peripheral vascular disease ([place])   . Pneumonia   . Prostate cancer ([place]) dx'd approx 2008   xrt throughfoley  . Radiation 02/18/15-02/25/15   right upper lung 54 Gy  . Shortness of breath dyspnea   . Stroke [facility]) 11-24-11   "no feeling in right 5th digit"  . Urinary frequency   . Urinary urgency    Past Surgical History:  Procedure Laterality Date  . CARDIAC CATHETERIZATION    . CARDIOVASCULAR STRESS TEST  12-2006  . CARDIOVERSION N/A 10/31/2015   Procedure: CARDIOVERSION;  Surgeon: Sueanne Margarita, MD;  Location: [place];  Service: Cardiovascular;  Laterality: N/A;  . CAROTID ENDARTERECTOMY Right 03-2000   CE  . CAROTID ENDARTERECTOMY Left 11-24-11   CE  . COLONOSCOPY  04-2001   polyp  . [place], 2002, 2009, 2012   8 stents total  . [place]   neg  . ENDARTERECTOMY  11/24/2011   Procedure: ENDARTERECTOMY CAROTID;  Surgeon: Rosetta Posner, MD;  Location: [place];  Service: Vascular;  Laterality: Left;  . EYE SURGERY Bilateral Aug. 17, 2015   Cataract  . FUDUCIAL PLACEMENT N/A 12/25/2014   Procedure: PLACEMENT OF FUDUCIAL;  Surgeon: Collene Gobble, MD;  Location: [place];  Service: Thoracic;  Laterality: N/A;  . IR GENERIC HISTORICAL  03/18/2016   IR US  GUIDE VASC ACCESS LEFT 03/18/2016 Jacqulynn Cadet, MD [facility]-INTERV RAD  . IR GENERIC HISTORICAL  03/18/2016   IR FLUORO GUIDE PORT INSERTION LEFT 03/18/2016 Jacqulynn Cadet, MD [facility]-INTERV RAD  . PROSTATE SURGERY     Radiation Tx  X's 40  . TEE WITHOUT CARDIOVERSION N/A 10/31/2015   Procedure: TRANSESOPHAGEAL ECHOCARDIOGRAM (TEE);  Surgeon: Sueanne Margarita, MD;  Location: [facility] ENDOSCOPY;  Service: Cardiovascular;  Laterality: N/A;  . VASCULAR SURGERY    . VIDEO BRONCHOSCOPY WITH ENDOBRONCHIAL NAVIGATION N/A 12/25/2014   Procedure: VIDEO BRONCHOSCOPY WITH ENDOBRONCHIAL NAVIGATION  with Biopsies and Brushings;  Surgeon: Collene Gobble, MD;  Location: [place];  Service: Thoracic;  Laterality: N/A;  . VIDEO BRONCHOSCOPY WITH ENDOBRONCHIAL ULTRASOUND N/A 01/22/2015   Procedure: VIDEO BRONCHOSCOPY WITH ENDOBRONCHIAL ULTRASOUND with Biopsies;  Surgeon: Collene Gobble, MD;  Location: [place];  Service: Thoracic;  Laterality: N/A;    Allergies  Allergen Reactions  . Augmentin [Amoxicillin-Pot Clavulanate] Diarrhea, Nausea And Vomiting and Other (See Comments)    Has patient had a PCN reaction causing immediate rash, facial/tongue/throat swelling, SOB or lightheadedness with hypotension: No Has patient had a PCN reaction causing severe rash involving mucus membranes or skin necrosis: No Has patient had a PCN reaction that required hospitalization No Has patient had a PCN reaction occurring within the last 10 years: Yes If all of the above answers are "NO", then may proceed with Cephalosporin use.   . Chantix [Varenicline] Other (See Comments)    Violent nightmares  . Sulfonamide Derivatives Other (See Comments)    Pt do not remember 8-30 md  . Zantac [Ranitidine Hcl] Diarrhea    Allergies as of 09/01/2016      Reactions   Augmentin [amoxicillin-pot Clavulanate] Diarrhea, Nausea And Vomiting, Other (See Comments)   Has patient had a PCN reaction causing immediate rash, facial/tongue/throat swelling, SOB or  lightheadedness with hypotension: No Has patient had a PCN reaction causing severe rash involving mucus membranes or skin necrosis: No Has patient had a PCN reaction that required hospitalization No Has patient had a PCN reaction occurring within the last 10 years: Yes If all of the above answers are "NO", then may proceed with Cephalosporin use.   Chantix [varenicline] Other (See Comments)   Violent nightmares   Sulfonamide Derivatives Other (See Comments)   Pt do not remember 8-30 md   Zantac [ranitidine Hcl] Diarrhea      Medication List       Accurate as of 09/01/16 11:59 PM. Always use your most recent med list.          acetaminophen 500 MG tablet Commonly known as:  TYLENOL Take 1,000 mg by mouth every 4 (four) hours as needed for mild pain, moderate pain, fever or headache.   ALPRAZolam 0.5 MG tablet Commonly known as:  XANAX Take 0.5-1 tablets (0.25-0.5 mg total) by mouth daily as needed  for anxiety or sleep.   bisacodyl 5 MG EC tablet Commonly known as:  DULCOLAX Take 1 tablet (5 mg total) by mouth daily as needed for moderate constipation.   clopidogrel 75 MG tablet Commonly known as:  PLAVIX TAKE 1 TABLET BY MOUTH EVERY DAY   diphenoxylate-atropine 2.5-0.025 MG tablet Commonly known as:  LOMOTIL Take 1-2 tablets by mouth 4 (four) times daily as needed for diarrhea or loose stools.   fludrocortisone 0.1 MG tablet Commonly known as:  FLORINEF Take 1 tablet (0.1 mg total) by mouth daily.   fluticasone-salmeterol 115-21 MCG/ACT inhaler Commonly known as:  ADVAIR HFA Inhale 2 puffs into the lungs 2 (two) times daily.   ipratropium-albuterol 0.5-2.5 (3) MG/3ML Soln Commonly known as:  DUONEB Take 3 mLs by nebulization every 4 (four) hours as needed (shortness of breath and wheezing).   lidocaine 2 % solution Commonly known as:  XYLOCAINE Use as directed 20 mLs in the mouth or throat as needed for mouth pain.   lidocaine-prilocaine cream Commonly known as:   EMLA Apply to port site one hour prior to use. Do not rub in. Cover with plastic.   loratadine 10 MG tablet Commonly known as:  CLARITIN TAKE 1 TABLET BY MOUTH EVERY DAY   mirtazapine 15 MG tablet Commonly known as:  REMERON TAKE 1 TABLET (15 MG TOTAL) BY MOUTH AT BEDTIME.   multivitamin with minerals Tabs tablet Take 1 tablet by mouth daily.   nitroGLYCERIN 0.4 MG SL tablet Commonly known as:  NITROSTAT Place 0.4 mg under the tongue every 5 (five) minutes as needed for chest pain.   omeprazole 40 MG capsule Commonly known as:  PRILOSEC TAKE 1 CAPSULE 30 MINUTES BEFORE BREAKFAST AND DINNER   potassium chloride 20 MEQ/15ML (10%) Soln Take 15 mLs (20 mEq total) by mouth daily.   predniSONE 20 MG tablet Commonly known as:  DELTASONE Take 0.5 tablets (10 mg total) by mouth every other day. Please continue after you take 50 mg prednisone for 5 days To stimulate appetite   prochlorperazine 5 MG tablet Commonly known as:  COMPAZINE Take 1 tablet (5 mg total) by mouth every 6 (six) hours as needed for nausea or vomiting.   tiotropium 18 MCG inhalation capsule Commonly known as:  SPIRIVA Place 1 capsule (18 mcg total) into inhaler and inhale daily.   traMADol 50 MG tablet Commonly known as:  ULTRAM Take 1 tablet (50 mg total) by mouth every 6 (six) hours as needed.   VENTOLIN HFA 108 (90 Base) MCG/ACT inhaler Generic drug:  albuterol INHALE 2 PUFFS INTO THE LUNGS EVERY 4 (FOUR) HOURS AS NEEDED FOR WHEEZING OR SHORTNESS OF BREATH.       Review of Systems  Constitutional: Positive for fatigue. Negative for activity change, appetite change, chills, diaphoresis and fever.  HENT: Negative for congestion, sneezing, sore throat, trouble swallowing and voice change.   Eyes: Negative for pain, redness and visual disturbance.  Respiratory: Negative for apnea, cough, choking, chest tightness, shortness of breath and wheezing.   Cardiovascular: Negative for chest pain, palpitations  and leg swelling.  Gastrointestinal: Negative for abdominal distention, abdominal pain, constipation, diarrhea and nausea.  Genitourinary: Negative for difficulty urinating, dysuria, frequency and urgency.  Musculoskeletal: Positive for arthralgias (typical arthritis) and back pain. Negative for gait problem and myalgias.  Skin: Negative for color change, pallor, rash and wound.  Neurological: Positive for dizziness and weakness. Negative for tremors, syncope, speech difficulty, numbness and headaches.  Psychiatric/Behavioral: Negative for agitation and  behavioral problems.  All other systems reviewed and are negative.   Immunization History  Administered Date(s) Administered  . Influenza Split 03/17/2012, 01/03/2013  . Influenza Whole 01/20/2007, 12/20/2009, 12/23/2010  . Influenza,inj,Quad PF,36+ Mos 01/21/2015, 12/26/2015  . Influenza-Unspecified 12/21/2013  . Pneumococcal Conjugate-13 05/03/2014  . Pneumococcal Polysaccharide-23 02/20/2003, 03/06/2003, 03/05/2010  . Td 05/21/2005   Pertinent  Health Maintenance Due  Topic Date Due  . INFLUENZA VACCINE  11/16/2016  . COLONOSCOPY  08/10/2017  . PNA vac Low Risk Adult  Completed   Fall Risk  02/24/2016 02/05/2016 07/18/2015 03/26/2015 11/06/2014  Falls in the past year? Yes Yes No No No  Number falls in past yr: 1 1 - - -  Injury with Fall? Yes Yes - - -   Functional Status Survey:    Vitals:   09/01/16 0610  BP: (!) 112/59  Pulse: 83  Resp: 18  Temp: 98.1 F (36.7 C)  SpO2: 99%   There is no height or weight on file to calculate BMI. Physical Exam  Constitutional: He is oriented to person, place, and time. Vital signs are normal. He appears well-developed and well-nourished. He is active and cooperative. He does not appear ill. No distress.  HENT:  Head: Normocephalic and atraumatic.  Mouth/Throat: Uvula is midline, oropharynx is clear and moist and mucous membranes are normal. Mucous membranes are not pale, not dry and  not cyanotic.  Eyes: Conjunctivae, EOM and lids are normal. Pupils are equal, round, and reactive to light.  Neck: Trachea normal, normal range of motion and full passive range of motion without pain. Neck supple. No JVD present. No tracheal deviation, no edema and no erythema present. No thyromegaly present.  Cardiovascular: Normal rate, regular rhythm, normal heart sounds, intact distal pulses and normal pulses.  Exam reveals no gallop, no distant heart sounds and no friction rub.   No murmur heard. Pulmonary/Chest: Effort normal and breath sounds normal. No accessory muscle usage. No respiratory distress. He has no wheezes. He has no rales. He exhibits no tenderness.  Abdominal: Normal appearance and bowel sounds are normal. He exhibits no distension and no ascites. There is no tenderness.  Musculoskeletal: Normal range of motion. He exhibits no edema or tenderness.  Expected osteoarthritis, stiffness  Neurological: He is alert and oriented to person, place, and time. He has normal strength.  Skin: Skin is warm, dry and intact. No rash noted. He is not diaphoretic. No cyanosis or erythema. No pallor. Nails show no clubbing.  Psychiatric: He has a normal mood and affect. His speech is normal and behavior is normal. Judgment and thought content normal. Cognition and memory are normal.  Nursing note and vitals reviewed.   Labs reviewed:  Recent Labs  08/18/16 0854 08/19/16 0550 08/20/16 0500  08/27/16 0301 08/31/16 1023 09/10/16 1237 09/11/16 1417  NA  --  135 132*  < > 132* 134* 131* 133*  K  --  3.9 3.7  < > 3.7 3.8 4.0 4.0  CL  --  100* 96*  < > 96*  --  96* 98*  CO2  --  29 29  < > 28 29 27 28   GLUCOSE  --  95 90  < > 106* 108 103* 102*  BUN  --  11 14  < > 13 16.2 23* 36*  CREATININE  --  0.65 0.68  < > 0.67 0.7 0.69 0.68  CALCIUM  --  8.7* 8.8*  < > 8.9 9.4 9.0 8.5*  MG 1.7 1.8  1.7  --   --   --   --   --   PHOS 3.6 3.5 4.1  --   --   --   --   --   < > = values in this  interval not displayed.  Recent Labs  08/27/16 0301 08/31/16 1023 09/10/16 1237  AST 16 12 25   ALT 12* 7 17  ALKPHOS 112 109 139*  BILITOT 0.4 0.40 0.6  PROT 6.3* 6.3* 7.1  ALBUMIN 3.1* 2.7* 3.5    Recent Labs  08/23/16 0440 08/27/16 0301 08/31/16 1022 09/10/16 1237  WBC 6.8 8.1 7.1 9.4  NEUTROABS 5.4  --  5.5 8.6*  HGB 10.3* 9.6* 9.2* 11.1*  HCT 32.3* 28.6* 28.0* 33.6*  MCV 85.5 83.6 85.2 84.9  PLT 400 351 371 429   Lab Results  Component Value Date   TSH 5.114 (H) 10/30/2015   Lab Results  Component Value Date   HGBA1C 5.8 11/20/2015   Lab Results  Component Value Date   CHOL 116 11/20/2015   HDL 51 11/20/2015   LDLCALC 52 11/20/2015   TRIG 65 11/20/2015   CHOLHDL 2.3 11/20/2015    Significant Diagnostic Results in last 30 days:  Dg Chest 2 View  Result Date: 08/27/2016 CLINICAL DATA:  Follow-up pneumonia. EXAM: CHEST  2 VIEW COMPARISON:  08/20/2016 .  CT 08/17/2016. FINDINGS: Surgical clips over the right chest . Interval removal of left chest tube . No pneumothorax. PowerPort catheter noted with lead tip over the superior vena cava. Persistent right upper lobe pleural-parenchymal density. Improving right base infiltrate. Heart size stable. Known bone lesions best identified by prior CT . IMPRESSION: 1. Interim removal of left chest tube. No pneumothorax. PowerPort catheter stable position. 2. Unchanged right upper lobe pleuroparenchymal density. Improving right base infiltrate. Small right pleural effusion again noted . 3.  Known bone lesions best identified by prior CT. Electronically Signed   By: Marcello Moores  Register   On: 08/27/2016 08:22   Ct Head Wo Contrast  Result Date: 08/27/2016 CLINICAL DATA:  Fall with head injury.  On Plavix. EXAM: CT HEAD WITHOUT CONTRAST CT CERVICAL SPINE WITHOUT CONTRAST TECHNIQUE: Multidetector CT imaging of the head and cervical spine was performed following the standard protocol without intravenous contrast. Multiplanar CT image  reconstructions of the cervical spine were also generated. COMPARISON:  Head and cervical spine CT 11/21/2015 FINDINGS: CT HEAD FINDINGS Brain: No evidence of acute infarction, hemorrhage, hydrocephalus, extra-axial collection or mass lesion/mass effect. Generalized atrophy and chronic small vessel ischemia. Mall encephalomalacia in the posterior right frontal lobe, new from prior exam. Vascular: Atherosclerosis of skullbase vasculature without hyperdense vessel or abnormal calcification. Skull: No skull fracture. Skullbase lesions better seen on cervical spine CT. Sinuses/Orbits: Mucosal thickening of right maxillary sinus. Scattered opacification of right mastoid air cells. Other: None. CT CERVICAL SPINE FINDINGS Alignment: Normal. Skull base and vertebrae: Osseous metastatic disease with lytic lesions within C7 and T2 vertebral bodies. T2 vertebral body lesion appears similar to chest CT 08/17/2016. C7 lesion is grossly similar to prior chest CT, only partially included on the prior exam. Probable lytic lesion within C6 vertebral body. Lesion within the right posterior aspect of C1 has a soft tissue component extending posteriorly. There are lytic skullbase lesions. Lytic lesion within left transverse process of C4 has soft tissue component. No definite acute fracture. Soft tissues and spinal canal: No prevertebral fluid or swelling. No visible canal hematoma. No definite osseous extension into the spinal canal. Disc levels:  Disc space narrowing and endplate spurring throughout. Upper chest: Emphysema and biapical pleuroparenchymal scarring. Expansile lesion in left anterior first rib. Other: None. IMPRESSION: 1. No acute intracranial abnormality. No evidence of skull fracture. 2. No fracture or subluxation in the cervical spine. 3. 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 Lesions within T2 and C7 vertebral bodies are grossly similar from recent chest CT.  Lesions within C6, posterior arch of C1 and posterior elements of C4 are new from most recent comparison CT of 11/21/2015. Electronically Signed   By: Jeb Levering M.D.   On: 08/27/2016 04:07   Ct Cervical Spine Wo Contrast  Result Date: 08/27/2016 CLINICAL DATA:  Fall with head injury.  On Plavix. EXAM: CT HEAD WITHOUT CONTRAST CT CERVICAL SPINE WITHOUT CONTRAST TECHNIQUE: Multidetector CT imaging of the head and cervical spine was performed following the standard protocol without intravenous contrast. Multiplanar CT image reconstructions of the cervical spine were also generated. COMPARISON:  Head and cervical spine CT 11/21/2015 FINDINGS: CT HEAD FINDINGS Brain: No evidence of acute infarction, hemorrhage, hydrocephalus, extra-axial collection or mass lesion/mass effect. Generalized atrophy and chronic small vessel ischemia. Mall encephalomalacia in the posterior right frontal lobe, new from prior exam. Vascular: Atherosclerosis of skullbase vasculature without hyperdense vessel or abnormal calcification. Skull: No skull fracture. Skullbase lesions better seen on cervical spine CT. Sinuses/Orbits: Mucosal thickening of right maxillary sinus. Scattered opacification of right mastoid air cells. Other: None. CT CERVICAL SPINE FINDINGS Alignment: Normal. Skull base and vertebrae: Osseous metastatic disease with lytic lesions within C7 and T2 vertebral bodies. T2 vertebral body lesion appears similar to chest CT 08/17/2016. C7 lesion is grossly similar to prior chest CT, only partially included on the prior exam. Probable lytic lesion within C6 vertebral body. Lesion within the right posterior aspect of C1 has a soft tissue component extending posteriorly. There are lytic skullbase lesions. Lytic lesion within left transverse process of C4 has soft tissue component. No definite acute fracture. Soft tissues and spinal canal: No prevertebral fluid or swelling. No visible canal hematoma. No definite osseous  extension into the spinal canal. Disc levels:  Disc space narrowing and endplate spurring throughout. Upper chest: Emphysema and biapical pleuroparenchymal scarring. Expansile lesion in left anterior first rib. Other: None. IMPRESSION: 1. No acute intracranial abnormality. No evidence of skull fracture. 2. No fracture or subluxation in the cervical spine. 3. 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 Lesions within T2 and C7 vertebral bodies are grossly similar from recent chest CT. Lesions within C6, posterior arch of C1 and posterior elements of C4 are new from most recent comparison CT of 11/21/2015. Electronically Signed   By: Jeb Levering M.D.   On: 08/27/2016 04:07    Assessment/Plan 1. Orthostatic hypotension  D5NS- give 250 mL bolus over 1 hour, then continuous at 50 mL/ x 7 days for hydration, hypotension and for infusion of IV steroids  Continue Fludrocortisone 0.1 mg po Q Day  2. Adrenal insufficiency (HCC)  Dexamethasone 4 mg IV Q 6 hours x 16 doses, then  Dexamethasone 4 mg IV Q 8 hours x 8 doses, then  Decadron 4 mg po Q 8 hours for adrenal insufficiency and bone pain  DC Prednisone  3. Malignant bone pain  Naproxyn 250 mg po BID  Lidocaine 5% patch to lower back Q Day, remove after 12 hours  Continue Tramadol 50 mg po QID scheduled  Continue Tramadol 50 mg po Q 4  hours prn  Family/ staff Communication:   Total Time: 35 minutes  Documentation: 5 minutes  Face to Face: 15 minutes  Family/Phone: 15 minutes   Labs/tests ordered:  Labs obtained yesterday from outside facility  Medication list reviewed and assessed for continued appropriateness.  Vikki Ports, NP-C Geriatrics [facility] [phone], [place] [address] Cell Phone (Mon-Fri 8am-5pm):  [phone] On Call:  [phone] & follow prompts after 5pm & weekends Office Phone:  [phone] Office  Fax:  [phone]

## 2016-09-21 ENCOUNTER — Telehealth: Payer: Self-pay

## 2016-09-21 NOTE — Telephone Encounter (Signed)
Done and in IN box 

## 2016-09-21 NOTE — Telephone Encounter (Signed)
Orders have been faxed as requested.

## 2016-09-21 NOTE — Telephone Encounter (Signed)
Debra with [facility] left v/m requesting status of fax sent over for Dr Glori Bickers to sign;pt has been discharged home and family request Dr Glori Bickers to be Hospice attending. This request was faxed to Dr Glori Bickers and Hilda Blades request fax to be signed and sent back. Debra request cb to let her know Dr Glori Bickers received fax.

## 2016-09-23 ENCOUNTER — Telehealth: Payer: Self-pay

## 2016-09-23 ENCOUNTER — Other Ambulatory Visit: Payer: Self-pay | Admitting: *Deleted

## 2016-09-23 DIAGNOSIS — C155 Malignant neoplasm of lower third of esophagus: Secondary | ICD-10-CM

## 2016-09-23 MED ORDER — ALPRAZOLAM 0.5 MG PO TABS: 0.2500 mg | ORAL_TABLET | Freq: Every day | ORAL | 0 refills | Status: DC | PRN

## 2016-09-23 NOTE — Telephone Encounter (Signed)
Last Rx 08/20/2016. Last OV 07/2016

## 2016-09-23 NOTE — Telephone Encounter (Signed)
Px written for call in   

## 2016-09-23 NOTE — Telephone Encounter (Signed)
Returned phone call from pt's daughter regarding her FMLA paperwork. This RN informed her that her concerns about her FMLA would be relayed to Bryson Corona. Pt verbalized understanding and voiced appreciation for call back.

## 2016-09-24 ENCOUNTER — Telehealth: Payer: Self-pay

## 2016-09-24 NOTE — Telephone Encounter (Signed)
Rx called in as prescribed 

## 2016-09-28 ENCOUNTER — Other Ambulatory Visit: Payer: Self-pay | Admitting: Family Medicine

## 2016-09-28 DIAGNOSIS — C155 Malignant neoplasm of lower third of esophagus: Secondary | ICD-10-CM

## 2016-09-28 NOTE — Telephone Encounter (Signed)
Sarah with [facility] left v/m requesting refill lomotil to [facility]. Last refilled # 60 on 06/15/16. And last seen f/u on 08/11/16.Please advise.

## 2016-09-28 NOTE — Telephone Encounter (Signed)
Rx called in as prescribed and left voicemail letting Judson Roch know Rx sent

## 2016-09-28 NOTE — Telephone Encounter (Signed)
Px written for call in   Please let them know it is a hospice pt Thanks

## 2016-09-28 NOTE — Progress Notes (Signed)
Location:      Place of Service:  SNF (31) Provider:  Toni Arthurs, NP-C  Tower, Wynelle Fanny, MD  Patient Care Team: Tower, Wynelle Fanny, MD as PCP - General Rolan Bucco, MD as Consulting Physician (Urology) Burnell Blanks, MD as Consulting Physician (Cardiology)  Extended Emergency Contact Information Primary Emergency Contact: Nolene Ebbs, [place] of [place] Phone: [phone] Work Phone: [phone] Mobile Phone: [phone] Relation: Daughter Secondary Emergency Contact: Ruthell Rummage [place] of [place] Phone: [phone] Relation: Son  Code Status:  DNR Goals of care: Advanced Directive information Advanced Directives 08/27/2016  Does Patient Have a Medical Advance Directive? Yes  Type of Paramedic of [place];Living will  Does patient want to make changes to medical advance directive? No - Patient declined  Copy of [place] in Chart? No - copy requested  Would patient like information on creating a medical advance directive? -     Chief Complaint  Patient presents with  . Acute Visit    HPI:  Pt is a 76 y.o. male seen today for an acute visit for pulmonary edema, possible suicidal intent, advanced directive discussion. Was informed by CSW the patient had made statements the night before that he was going to throw himself on the floor and break a hip "so you [the facility] would have to call my family." The pt was having delusions that the facility was holding his daughter prisoner. He was stating he was going to "just kill himself." Family came and stayed with pt last night for companionship. Today, when asked, pt was very apologetic and embarrassed that he had made those statements the night before. He verbalized he was just upset and very frustrated and was "showing out." He verbalized he has no intention to kill or harm himself. He does not have the means or the  desire. He promised staff and his family he would not say those things again and would speak to someone if he had those thoughts or frustrations again. During the conversation, pt also verbalized that he wanted to be a DNR- "if he was found dead and there was no bringing him back."  He did not want to be on a vent and did not want to have compressions done as he felt it would be futile. These wishes and statements were made in the presence of a ?niece, his daughter Magda Paganini) via telephone, myself and Lorelee Cover, CSW. Will sign appropriate documentation for this. Pt also c/o some dyspnea, cough, congestion- non-productive cough. Lungs mildly rhonchus. Will order small dose diuretic to clear lungs along with breathing treatments. VSS.   Past Medical History:  Diagnosis Date  . AAA (abdominal aortic aneurysm) (HCC)    4.2 cm IR AAA noted on 01/13/15 PET scan  . Anxiety   . CAD (coronary artery disease)    AMI 1997 w/ BMS x 2 LAD, BMS CFX 2002, DES x 2 LAD 2009, DES CFX & OM 2012   . Carotid artery disease ([place])    RICA CEA 3704, LICA CEA 8889 per Dr. [name],   . COPD (chronic obstructive pulmonary disease) ([place])   . GERD (gastroesophageal reflux disease)    occ  . HTN (hypertension)   . Hydradenitis   . Hyperlipidemia   . Ischemic cardiomyopathy    EF 40-45 percent at cath 2012  . Myocardial infarction ([place])   . Non-small cell lung cancer (NSCLC) ([place])  dx'd 12/2014   SBRT  . Osteoarthritis    pt. denies 01/17/2015  . PAC (premature atrial contraction)   . Peripheral vascular disease ([place])   . Pneumonia   . Prostate cancer ([place]) dx'd approx 2008   xrt throughfoley  . Radiation 02/18/15-02/25/15   right upper lung 54 Gy  . Shortness of breath dyspnea   . Stroke [facility]) 11-24-11   "no feeling in right 5th digit"  . Urinary frequency   . Urinary urgency    Past Surgical History:  Procedure Laterality Date  . CARDIAC CATHETERIZATION    . CARDIOVASCULAR STRESS TEST  12-2006  . CARDIOVERSION  N/A 10/31/2015   Procedure: CARDIOVERSION;  Surgeon: Sueanne Margarita, MD;  Location: [facility];  Service: Cardiovascular;  Laterality: N/A;  . CAROTID ENDARTERECTOMY Right 03-2000   CE  . CAROTID ENDARTERECTOMY Left 11-24-11   CE  . COLONOSCOPY  04-2001   polyp  . [place], 2002, 2009, 2012   8 stents total  . [place]   neg  . ENDARTERECTOMY  11/24/2011   Procedure: ENDARTERECTOMY CAROTID;  Surgeon: Rosetta Posner, MD;  Location: [facility];  Service: Vascular;  Laterality: Left;  . EYE SURGERY Bilateral Aug. 17, 2015   Cataract  . FUDUCIAL PLACEMENT N/A 12/25/2014   Procedure: PLACEMENT OF FUDUCIAL;  Surgeon: Collene Gobble, MD;  Location: [facility];  Service: Thoracic;  Laterality: N/A;  . IR GENERIC HISTORICAL  03/18/2016   IR US GUIDE VASC ACCESS LEFT 03/18/2016 Jacqulynn Cadet, MD [facility]-INTERV RAD  . IR GENERIC HISTORICAL  03/18/2016   IR FLUORO GUIDE PORT INSERTION LEFT 03/18/2016 Jacqulynn Cadet, MD [facility]INTER[facility]  . PROSTATE SURGERY     Radiation Tx  X's 40  . TEE WITHOUT CARDIOVERSION N/A 10/31/2015   Procedure: TRANSESOPHAGEAL ECHOCARDIOGRAM (TEE);  Surgeon: Sueanne Margarita, MD;  Location: [facility] ENDOSCOPY;  Service: Cardiovascular;  Laterality: N/A;  . VASCULAR SURGERY    . VIDEO BRONCHOSCOPY WITH ENDOBRONCHIAL NAVIGATION N/A 12/25/2014   Procedure: VIDEO BRONCHOSCOPY WITH ENDOBRONCHIAL NAVIGATION  with Biopsies and Brushings;  Surgeon: Collene Gobble, MD;  Location: [facility];  Service: Thoracic;  Laterality: N/A;  . VIDEO BRONCHOSCOPY WITH ENDOBRONCHIAL ULTRASOUND N/A 01/22/2015   Procedure: VIDEO BRONCHOSCOPY WITH ENDOBRONCHIAL ULTRASOUND with Biopsies;  Surgeon: Collene Gobble, MD;  Location: [facility];  Service: Thoracic;  Laterality: N/A;    Allergies  Allergen Reactions  . Augmentin [Amoxicillin-Pot Clavulanate] Diarrhea, Nausea And Vomiting and Other (See Comments)    Has patient had a PCN reaction causing immediate rash, facial/tongue/throat swelling,  SOB or lightheadedness with hypotension: No Has patient had a PCN reaction causing severe rash involving mucus membranes or skin necrosis: No Has patient had a PCN reaction that required hospitalization No Has patient had a PCN reaction occurring within the last 10 years: Yes If all of the above answers are "NO", then may proceed with Cephalosporin use.   . Chantix [Varenicline] Other (See Comments)    Violent nightmares  . Sulfonamide Derivatives Other (See Comments)    Pt do not remember 8-30 md  . Zantac [Ranitidine Hcl] Diarrhea    Allergies as of 09/09/2016      Reactions   Augmentin [amoxicillin-pot Clavulanate] Diarrhea, Nausea And Vomiting, Other (See Comments)   Has patient had a PCN reaction causing immediate rash, facial/tongue/throat swelling, SOB or lightheadedness with hypotension: No Has patient had a PCN reaction causing severe rash involving mucus membranes or skin necrosis: No  Has patient had a PCN reaction that required hospitalization No Has patient had a PCN reaction occurring within the last 10 years: Yes If all of the above answers are "NO", then may proceed with Cephalosporin use.   Chantix [varenicline] Other (See Comments)   Violent nightmares   Sulfonamide Derivatives Other (See Comments)   Pt do not remember 8-30 md   Zantac [ranitidine Hcl] Diarrhea      Medication List       Accurate as of 09/09/16 11:59 PM. Always use your most recent med list.          acetaminophen 500 MG tablet Commonly known as:  TYLENOL Take 1,000 mg by mouth every 4 (four) hours as needed for mild pain, moderate pain, fever or headache.   bisacodyl 5 MG EC tablet Commonly known as:  DULCOLAX Take 1 tablet (5 mg total) by mouth daily as needed for moderate constipation.   clopidogrel 75 MG tablet Commonly known as:  PLAVIX TAKE 1 TABLET BY MOUTH EVERY DAY   fludrocortisone 0.1 MG tablet Commonly known as:  FLORINEF Take 1 tablet (0.1 mg total) by mouth daily.     fluticasone-salmeterol 115-21 MCG/ACT inhaler Commonly known as:  ADVAIR HFA Inhale 2 puffs into the lungs 2 (two) times daily.   ipratropium-albuterol 0.5-2.5 (3) MG/3ML Soln Commonly known as:  DUONEB Take 3 mLs by nebulization every 4 (four) hours as needed (shortness of breath and wheezing).   lidocaine 2 % solution Commonly known as:  XYLOCAINE Use as directed 20 mLs in the mouth or throat as needed for mouth pain.   lidocaine-prilocaine cream Commonly known as:  EMLA Apply to port site one hour prior to use. Do not rub in. Cover with plastic.   loratadine 10 MG tablet Commonly known as:  CLARITIN TAKE 1 TABLET BY MOUTH EVERY DAY   mirtazapine 15 MG tablet Commonly known as:  REMERON TAKE 1 TABLET (15 MG TOTAL) BY MOUTH AT BEDTIME.   multivitamin with minerals Tabs tablet Take 1 tablet by mouth daily.   nitroGLYCERIN 0.4 MG SL tablet Commonly known as:  NITROSTAT Place 0.4 mg under the tongue every 5 (five) minutes as needed for chest pain.   omeprazole 40 MG capsule Commonly known as:  PRILOSEC TAKE 1 CAPSULE 30 MINUTES BEFORE BREAKFAST AND DINNER   potassium chloride 20 MEQ/15ML (10%) Soln Take 15 mLs (20 mEq total) by mouth daily.   predniSONE 20 MG tablet Commonly known as:  DELTASONE Take 0.5 tablets (10 mg total) by mouth every other day. Please continue after you take 50 mg prednisone for 5 days To stimulate appetite   prochlorperazine 5 MG tablet Commonly known as:  COMPAZINE Take 1 tablet (5 mg total) by mouth every 6 (six) hours as needed for nausea or vomiting.   tiotropium 18 MCG inhalation capsule Commonly known as:  SPIRIVA Place 1 capsule (18 mcg total) into inhaler and inhale daily.   traMADol 50 MG tablet Commonly known as:  ULTRAM Take 1 tablet (50 mg total) by mouth every 6 (six) hours as needed.   VENTOLIN HFA 108 (90 Base) MCG/ACT inhaler Generic drug:  albuterol INHALE 2 PUFFS INTO THE LUNGS EVERY 4 (FOUR) HOURS AS NEEDED FOR  WHEEZING OR SHORTNESS OF BREATH.       Review of Systems  Constitutional: Positive for fatigue. Negative for activity change, appetite change, chills, diaphoresis and fever.  HENT: Negative for congestion, sneezing, sore throat, trouble swallowing and voice change.   Eyes:  Negative for pain, redness and visual disturbance.  Respiratory: Positive for cough. Negative for apnea, choking, chest tightness, shortness of breath and wheezing.   Cardiovascular: Negative for chest pain, palpitations and leg swelling.  Gastrointestinal: Negative for abdominal distention, abdominal pain, constipation, diarrhea and nausea.  Genitourinary: Negative for difficulty urinating, dysuria, frequency and urgency.  Musculoskeletal: Positive for arthralgias (typical arthritis) and back pain. Negative for gait problem and myalgias.  Skin: Negative for color change, pallor, rash and wound.  Neurological: Positive for weakness. Negative for dizziness, tremors, syncope, speech difficulty, numbness and headaches.  Psychiatric/Behavioral: Negative for agitation and behavioral problems.  All other systems reviewed and are negative.   Immunization History  Administered Date(s) Administered  . Influenza Split 03/17/2012, 01/03/2013  . Influenza Whole 01/20/2007, 12/20/2009, 12/23/2010  . Influenza,inj,Quad PF,36+ Mos 01/21/2015, 12/26/2015  . Influenza-Unspecified 12/21/2013  . Pneumococcal Conjugate-13 05/03/2014  . Pneumococcal Polysaccharide-23 02/20/2003, 03/06/2003, 03/05/2010  . Td 05/21/2005   Pertinent  Health Maintenance Due  Topic Date Due  . INFLUENZA VACCINE  06-Nov-2016  . COLONOSCOPY  08/10/2017  . PNA vac Low Risk Adult  Completed   Fall Risk  02/24/2016 02/05/2016 07/18/2015 03/26/2015 11/06/2014  Falls in the past year? Yes Yes No No No  Number falls in past yr: 1 1 - - -  Injury with Fall? Yes Yes - - -   Functional Status Survey:    Vitals:   09/09/16 0600  BP: (!) 163/83  Pulse: 68    Resp: 18  Temp: 98 F (36.7 C)  SpO2: 99%   There is no height or weight on file to calculate BMI. Physical Exam  Constitutional: He is oriented to person, place, and time. Vital signs are normal. He appears well-developed and well-nourished. He is active and cooperative. He does not appear ill. No distress.  HENT:  Head: Normocephalic and atraumatic.  Mouth/Throat: Uvula is midline, oropharynx is clear and moist and mucous membranes are normal. Mucous membranes are not pale, not dry and not cyanotic.  Eyes: Conjunctivae, EOM and lids are normal. Pupils are equal, round, and reactive to light.  Neck: Trachea normal, normal range of motion and full passive range of motion without pain. Neck supple. No JVD present. No tracheal deviation, no edema and no erythema present. No thyromegaly present.  Cardiovascular: Normal rate, regular rhythm, normal heart sounds, intact distal pulses and normal pulses.  Exam reveals no gallop, no distant heart sounds and no friction rub.   No murmur heard. Pulmonary/Chest: Effort normal. No accessory muscle usage. No respiratory distress. He has no wheezes. He has rhonchi (mild) in the right upper field, the right middle field, the left upper field, the left middle field and the left lower field. He has no rales. He exhibits no tenderness.  Abdominal: Normal appearance and bowel sounds are normal. He exhibits no distension and no ascites. There is no tenderness.  Musculoskeletal: Normal range of motion. He exhibits no edema or tenderness.  Expected osteoarthritis, stiffness  Neurological: He is alert and oriented to person, place, and time. He has normal strength.  Skin: Skin is warm, dry and intact. No rash noted. He is not diaphoretic. No cyanosis or erythema. No pallor. Nails show no clubbing.  Psychiatric: His speech is normal. His mood appears anxious. He is agitated and actively hallucinating. Thought content is delusional. Cognition and memory are impaired.  He expresses impulsivity and inappropriate judgment. He exhibits a depressed mood. He expresses suicidal ideation. He exhibits abnormal recent memory.  Nursing  note and vitals reviewed.   Labs reviewed:  Recent Labs  08/18/16 0854 08/19/16 0550 08/20/16 0500  08/27/16 0301 08/31/16 1023 09/10/16 1237 09/11/16 1417  NA  --  135 132*  < > 132* 134* 131* 133*  K  --  3.9 3.7  < > 3.7 3.8 4.0 4.0  CL  --  100* 96*  < > 96*  --  96* 98*  CO2  --  29 29  < > 28 29 27 28   GLUCOSE  --  95 90  < > 106* 108 103* 102*  BUN  --  11 14  < > 13 16.2 23* 36*  CREATININE  --  0.65 0.68  < > 0.67 0.7 0.69 0.68  CALCIUM  --  8.7* 8.8*  < > 8.9 9.4 9.0 8.5*  MG 1.7 1.8 1.7  --   --   --   --   --   PHOS 3.6 3.5 4.1  --   --   --   --   --   < > = values in this interval not displayed.  Recent Labs  08/27/16 0301 08/31/16 1023 09/10/16 1237  AST 16 12 25   ALT 12* 7 17  ALKPHOS 112 109 139*  BILITOT 0.4 0.40 0.6  PROT 6.3* 6.3* 7.1  ALBUMIN 3.1* 2.7* 3.5    Recent Labs  08/23/16 0440 08/27/16 0301 08/31/16 1022 09/10/16 1237  WBC 6.8 8.1 7.1 9.4  NEUTROABS 5.4  --  5.5 8.6*  HGB 10.3* 9.6* 9.2* 11.1*  HCT 32.3* 28.6* 28.0* 33.6*  MCV 85.5 83.6 85.2 84.9  PLT 400 351 371 429   Lab Results  Component Value Date   TSH 5.114 (H) 10/30/2015   Lab Results  Component Value Date   HGBA1C 5.8 11/20/2015   Lab Results  Component Value Date   CHOL 116 11/20/2015   HDL 51 11/20/2015   LDLCALC 52 11/20/2015   TRIG 65 11/20/2015   CHOLHDL 2.3 11/20/2015    Significant Diagnostic Results in last 30 days:  No results found.  Assessment/Plan 1. Chronic pulmonary edema  Lasix 20 mg IM x 1  2. Verbalizes suicidal thoughts  No risk for harm to self  No plan  No serious intent  No means   Family still to remain with pt at all times for safety/redirection  3. Advanced directives, counseling/discussion  DNR  DNI  Family/ staff Communication:   Total Time: 50  minutes  Documentation: 10 minutes  Face to Face: 30 minutes  Family/Phone: 10 minutes- discussion with CSW, daughter   Labs/tests ordered:  Cbc, met c  Medication list reviewed and assessed for continued appropriateness.  Vikki Ports, NP-C Geriatrics [facility] [phone], [place] [address] Cell Phone (Mon-Fri 8am-5pm):  [phone] On Call:  [phone] & follow prompts after 5pm & weekends Office Phone:  [phone] Office Fax:  [phone]

## 2016-09-30 ENCOUNTER — Telehealth: Payer: Self-pay

## 2016-09-30 NOTE — Telephone Encounter (Signed)
For remeron- lower doses are actually more sedating than higher ones  If you want to increase it for depression-that is ok / but not for sleep  Do they want to add something else for sleep?

## 2016-09-30 NOTE — Telephone Encounter (Signed)
Hospice nurse left a vm requesting an increase on pt's REMERON to help with his sleep, PT currently takes remeron 15mg  once a day.

## 2016-10-01 ENCOUNTER — Other Ambulatory Visit: Payer: Self-pay | Admitting: *Deleted

## 2016-10-01 DIAGNOSIS — C155 Malignant neoplasm of lower third of esophagus: Secondary | ICD-10-CM

## 2016-10-01 MED ORDER — TEMAZEPAM 15 MG PO CAPS: 15.0000 mg | ORAL_CAPSULE | Freq: Every evening | ORAL | 3 refills | Status: AC | PRN

## 2016-10-01 MED ORDER — ALPRAZOLAM 0.5 MG PO TABS: 0.5000 mg | ORAL_TABLET | Freq: Four times a day (QID) | ORAL | 1 refills | Status: AC | PRN

## 2016-10-01 NOTE — Telephone Encounter (Signed)
Rx called in as prescribed 

## 2016-10-01 NOTE — Telephone Encounter (Signed)
Let's try temazepam 15 mg and see how that works  Stop the xanax ordered for night time and try this instead Warn of fall risk/ sedation   Keep me posted I printed it to fax

## 2016-10-01 NOTE — Telephone Encounter (Signed)
Pharmacy is requesting new Rx with direction of taking 1-2 tablets by mouth every 4 hours prn for anxiety. Last filled on 09/23/16 #15 tabs, pharmacy ? If you want to increase that amount since pt is on hospice now and using it more often then once daily, please advise

## 2016-10-01 NOTE — Telephone Encounter (Signed)
Rx faxed and [name] with Hospice notified and advise of Dr. Marliss Coots comments

## 2016-10-01 NOTE — Telephone Encounter (Signed)
[name] said that she did want to increase it for sleep but since that wont help she did want you to send in any sleep medication that you can recommend. [name] request that we send the Rx to [facility] [address]. And then call her to let her know what Dr. Glori Bickers prescribes so Hospice can pay for it

## 2016-10-01 NOTE — Telephone Encounter (Signed)
Px written for call in   I wrote for 120 pills with a refill - until we get an idea of how many he is needed a day Thanks for keeping me updated

## 2016-10-04 NOTE — Telephone Encounter (Signed)
Call from pt's daughter reporting she has been out of work for 3 weeks and FMLA forms have not been returned. They need to be completed by 7/19. They were initially filled out as intermittent. Needs to be continuous.  Fax to [phone].

## 2016-10-11 ENCOUNTER — Telehealth: Payer: Self-pay | Admitting: *Deleted

## 2016-10-11 MED ORDER — MORPHINE SULFATE 20 MG/5ML PO SOLN: 5.0000 mg | ORAL | 0 refills | Status: AC | PRN

## 2016-10-11 NOTE — Telephone Encounter (Signed)
[facility], and Judson Roch notified  Judson Roch wanted me to give Dr. Glori Bickers a message. It's just an FYI but pt's care has become to much for pt's daughter so some time this week he will be going to [facility]. She just wanted to make you aware of this incase you start getting orders or notes from them

## 2016-10-11 NOTE — Telephone Encounter (Signed)
Thanks for making me aware

## 2016-10-11 NOTE — Telephone Encounter (Signed)
Printed and in IN box  

## 2016-10-11 NOTE — Telephone Encounter (Signed)
Spoke to sarah with hospice who states that she is needing a new Rx for pts morphine. It is not on his current medication list. He takes 20mg /ml and pt takes 5-10mg  every hour prn. Rx can be faxed to [facility] and just needs to indicate pt is on hospice

## 2016-10-14 DIAGNOSIS — C3480 Malignant neoplasm of overlapping sites of unspecified bronchus and lung: Secondary | ICD-10-CM | POA: Diagnosis not present

## 2016-10-14 DIAGNOSIS — C155 Malignant neoplasm of lower third of esophagus: Secondary | ICD-10-CM | POA: Diagnosis not present

## 2016-10-14 DIAGNOSIS — J449 Chronic obstructive pulmonary disease, unspecified: Secondary | ICD-10-CM | POA: Diagnosis not present

## 2016-10-14 DIAGNOSIS — I2510 Atherosclerotic heart disease of native coronary artery without angina pectoris: Secondary | ICD-10-CM | POA: Diagnosis not present

## 2016-10-19 ENCOUNTER — Other Ambulatory Visit: Payer: Self-pay | Admitting: *Deleted

## 2016-10-19 MED ORDER — FLUDROCORTISONE ACETATE 0.1 MG PO TABS: 0.1000 mg | ORAL_TABLET | Freq: Every day | ORAL | 0 refills | Status: AC

## 2016-10-29 ENCOUNTER — Telehealth: Payer: Self-pay

## 2016-10-29 NOTE — Telephone Encounter (Signed)
[name], daughter, called to let Dr Benay Spice know pt has passed away.

## 2016-11-20 DEATH — deceased

## 2016-12-17 ENCOUNTER — Telehealth: Payer: Self-pay | Admitting: Oncology

## 2016-12-20 ENCOUNTER — Telehealth: Payer: Self-pay | Admitting: Oncology

## 2016-12-20 NOTE — Telephone Encounter (Signed)
12/15/16 [facility] phone message. Pt is deceased please call daughter.

## 2016-12-31 ENCOUNTER — Other Ambulatory Visit: Payer: Self-pay | Admitting: Nurse Practitioner

## 2017-03-08 ENCOUNTER — Encounter (HOSPITAL_COMMUNITY): Payer: Medicare Other

## 2017-03-08 ENCOUNTER — Ambulatory Visit: Payer: Medicare Other | Admitting: Family

## 2018-01-18 IMAGING — CR DG LUMBAR SPINE 2-3V
3 series · 3 of 3 positions shown · non-contrast
Comparison: Coronal and sagittal CT images through the lumbar spine
from an abdominal and pelvic CT scan July 21, 2016

CLINICAL DATA: Mid to lower back pain and left hip pain since
yesterday when being moved from 1 bed to another using a transfer
board.

EXAM:
LUMBAR SPINE - 2-3 VIEW

[l-spine ap]
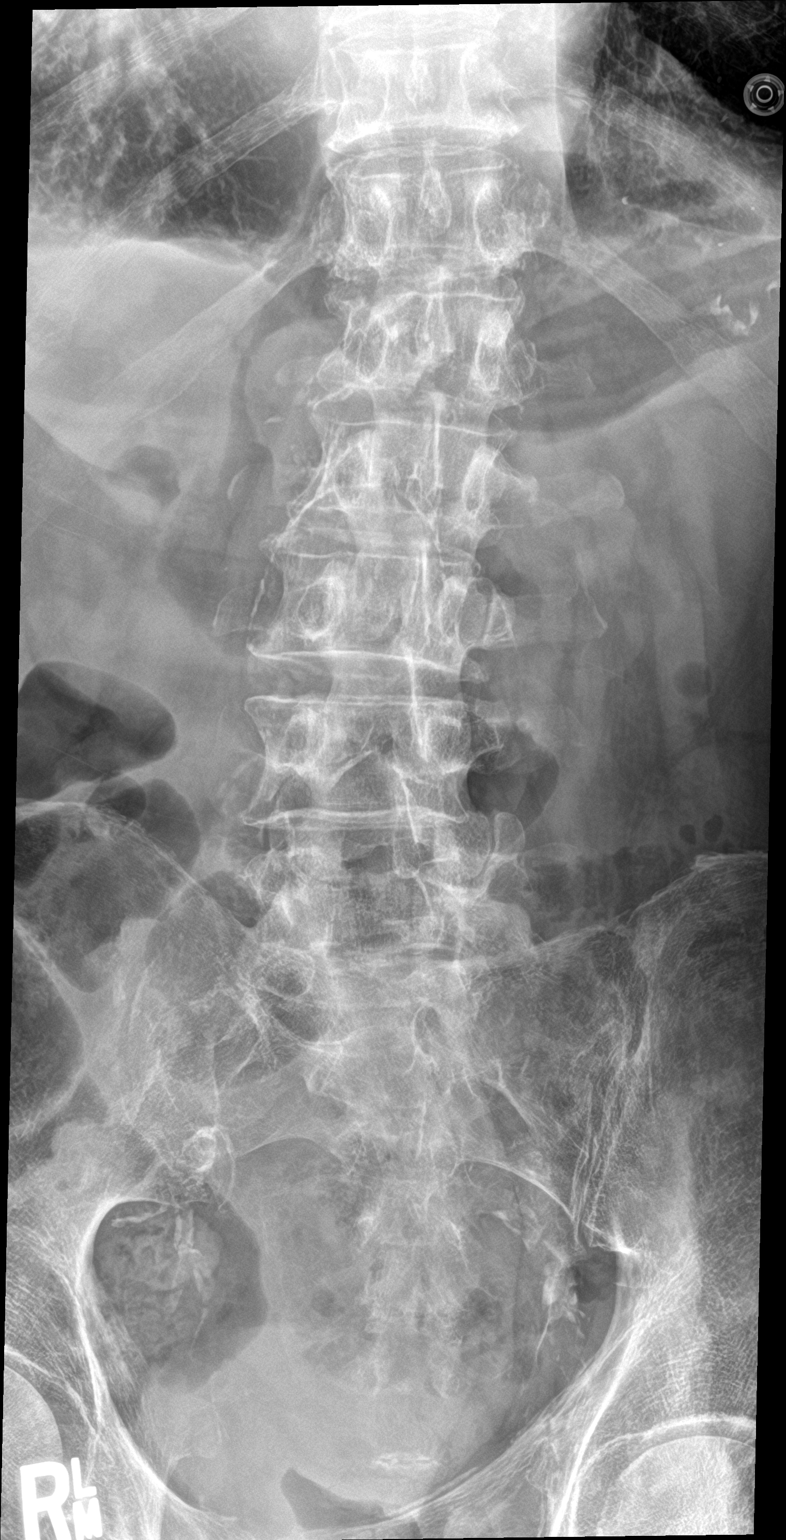

[l-spine lat]
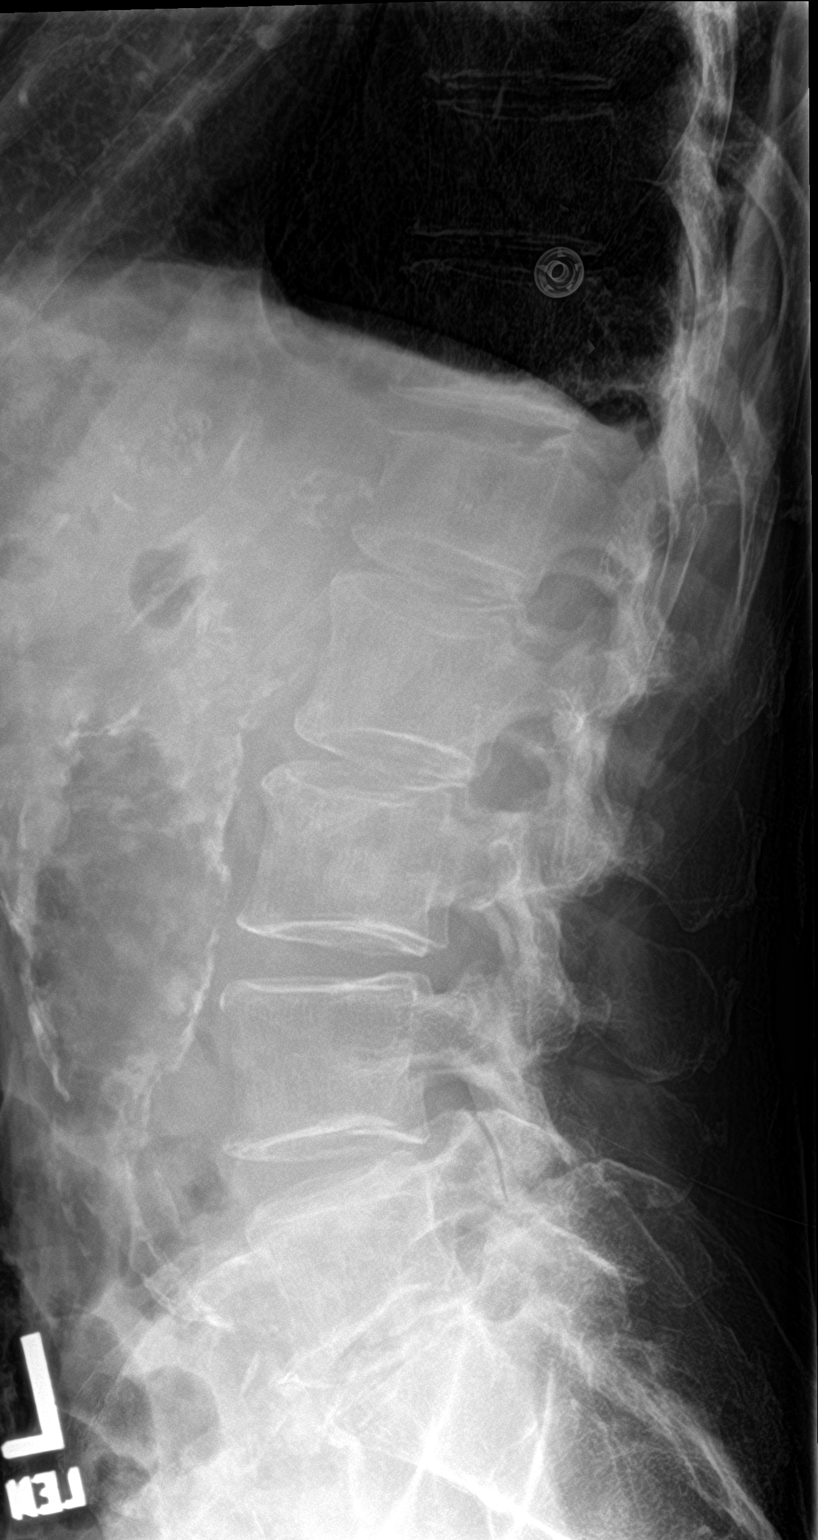

[l-spine spot]
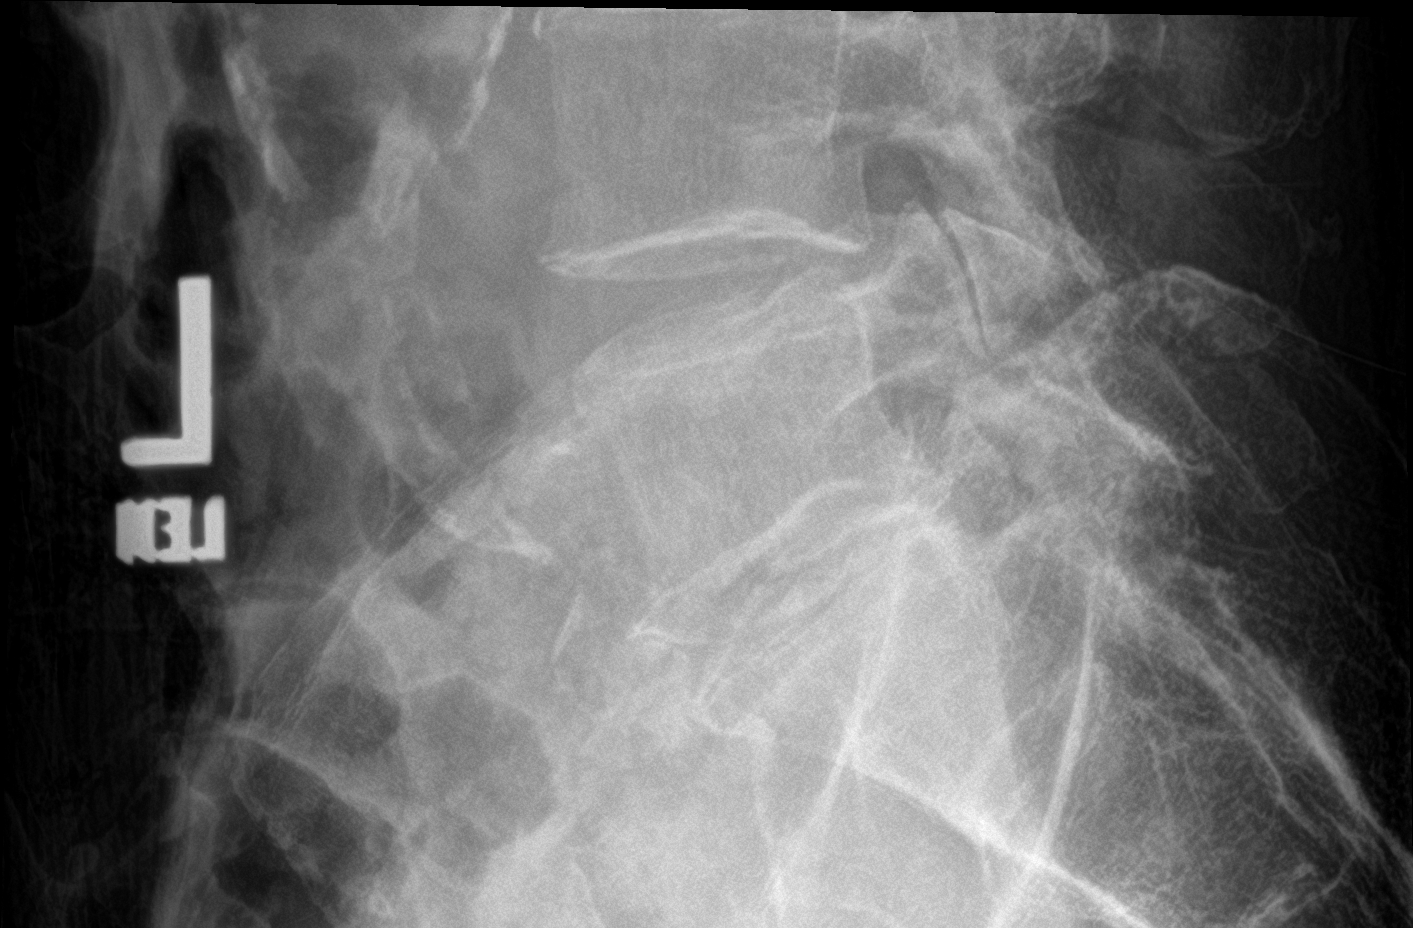

[3 of 3 positions shown; findings below may reference images not displayed]

FINDINGS: The lumbar vertebral bodies are preserved in height. There is gentle
curvature convex toward the right centered at L3. There is a stable
sclerotic focus in the body of L3. The disc space heights are well
maintained. There is no spondylolisthesis. There is mild facet joint
hypertrophy at L5-S1. The pedicles and transverse processes are
intact where visualized. The observed portions of the sacrum are
grossly normal. There is calcification in the wall of the abdominal
aorta with an infrarenal aneurysm demonstrated measuring up to
cm in diameter.
IMPRESSION: No acute bony abnormality of the lumbar spine. Mild degenerative
facet joint changes. Stable L3 sclerotic focus.

Infrarenal abdominal aortic aneurysm measuring up to 4.3 cm in
greatest AP dimension stable since the CT scan.

## 2018-01-19 IMAGING — DX DG CHEST 1V PORT
1 series · 1 of 1 positions shown · non-contrast
Comparison: 08/17/2016

CLINICAL DATA: Followup left chest tube.

EXAM:
PORTABLE CHEST 1 VIEW

[chest ap]
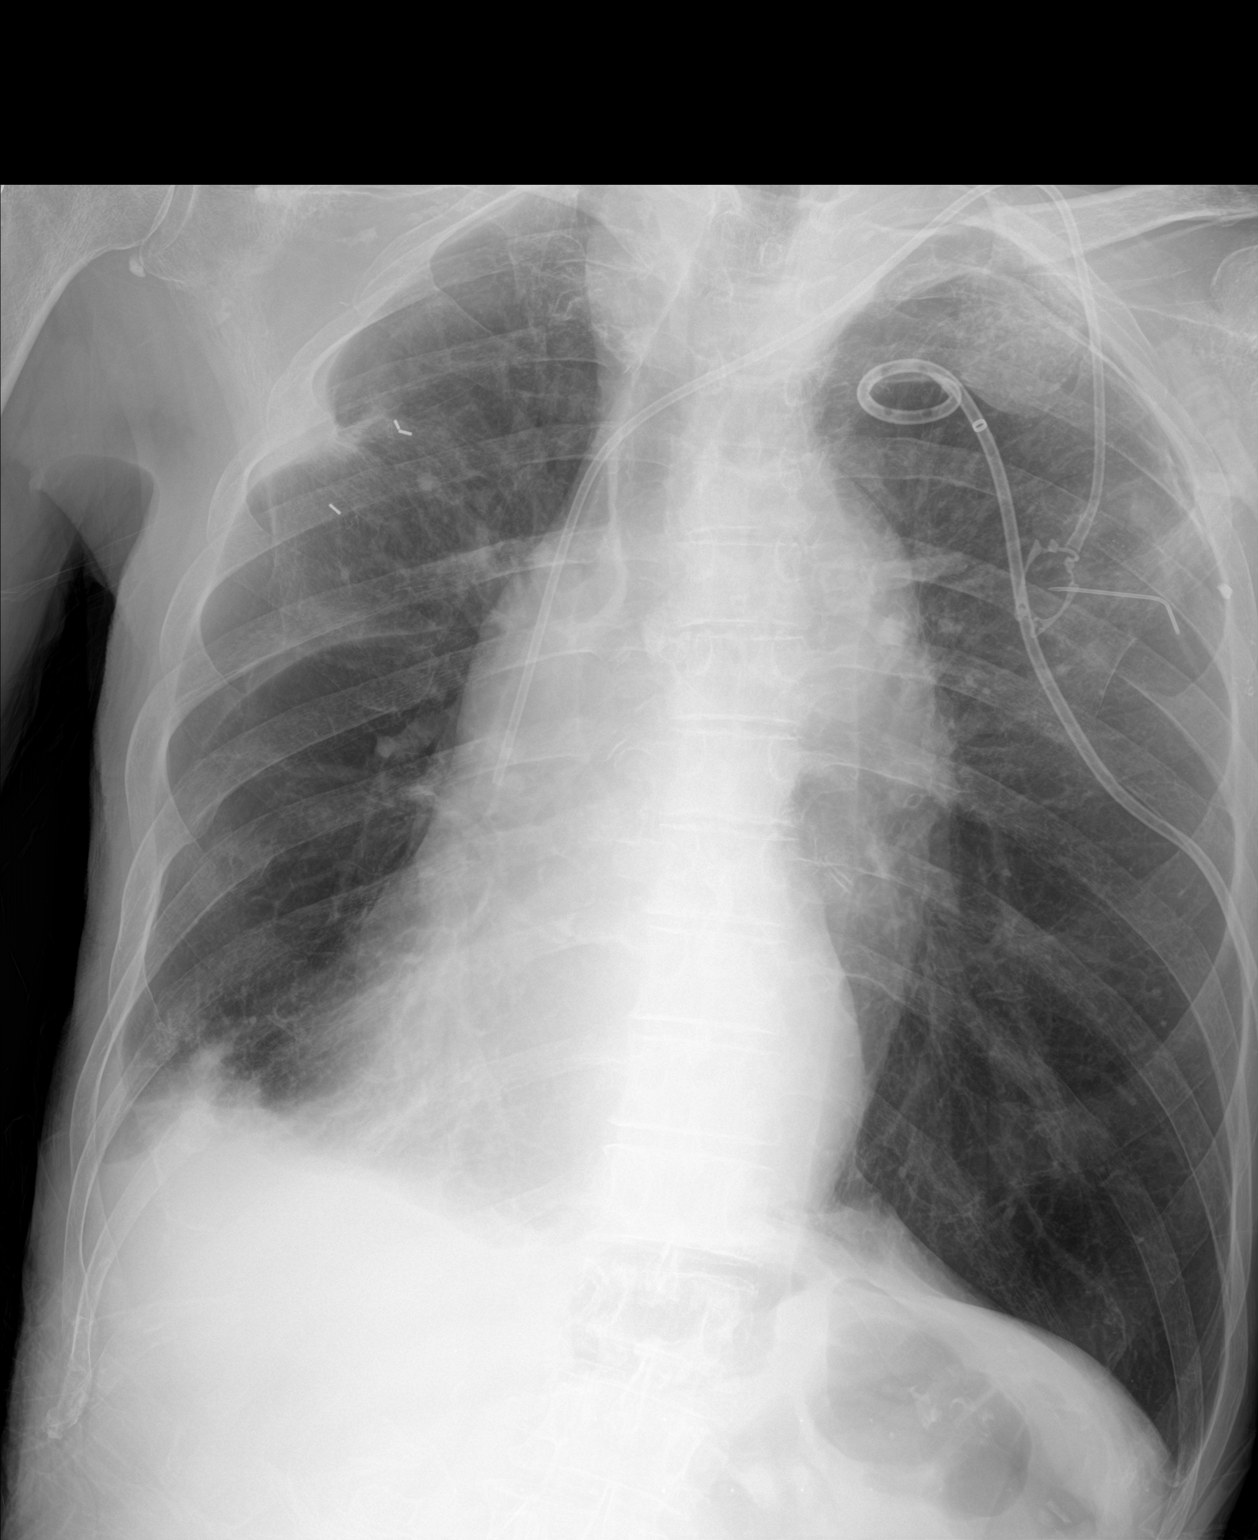

[1 of 1 positions shown; findings below may reference images not displayed]

FINDINGS: Small caliber left upper chest tube in place. Left pneumothorax
smaller, less than 5%. Port-A-Cath appears the same. Chronic
pulmonary scarring on the right with some basilar atelectasis.

Heart size is unchanged. Chronic aortic atherosclerosis and
tortuosity. Known lytic bone disease with subtle manifestation by
radiography.
IMPRESSION: Subtotal re-expansion of the left lung. Residual pleural air at the
apex less than 5%.

## 2018-01-27 IMAGING — CR DG CHEST 2V
2 series · 2 of 2 positions shown · non-contrast
Comparison: 08/20/2016 .  CT 08/17/2016.

CLINICAL DATA: Follow-up pneumonia.

EXAM:
CHEST  2 VIEW

[chest pa]
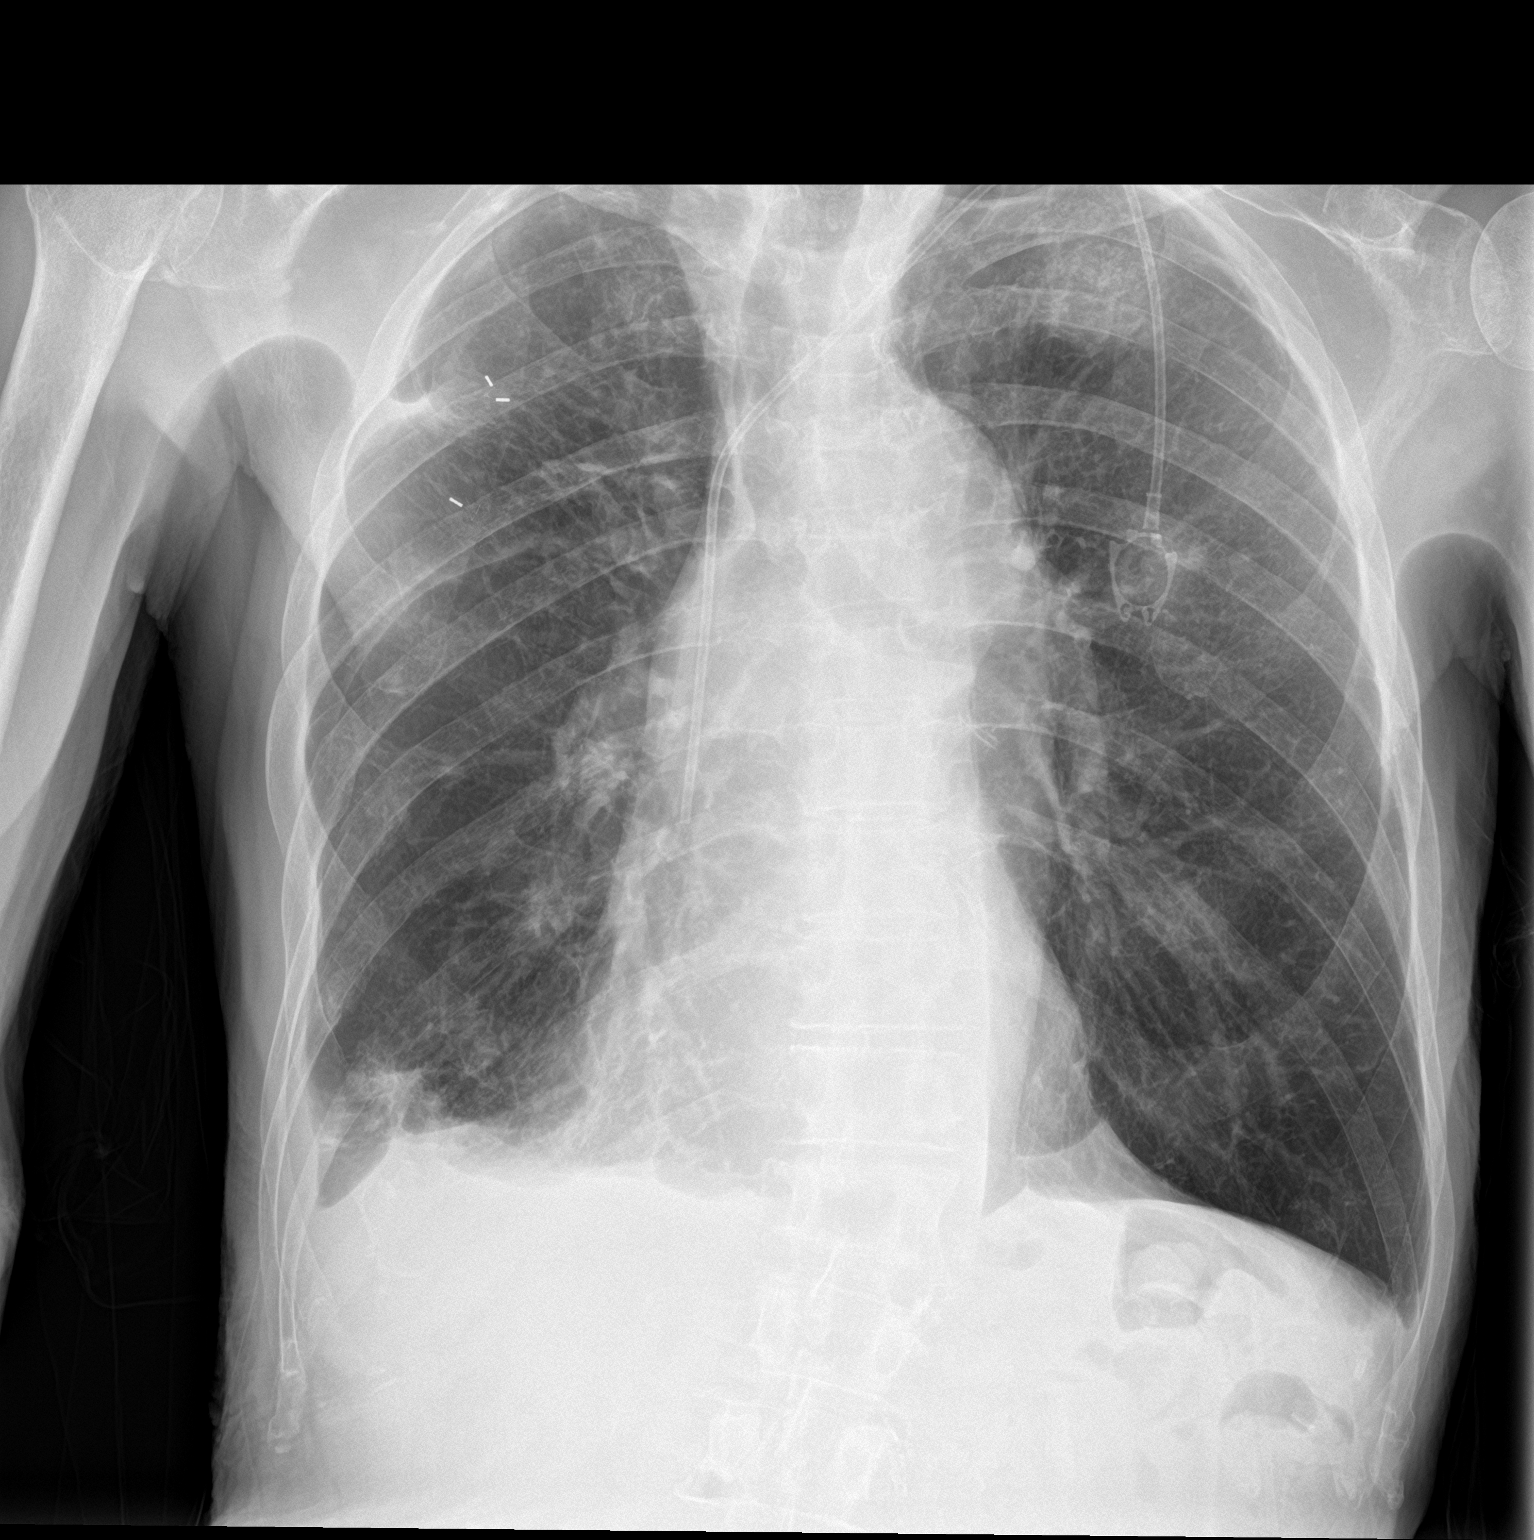

[chest lat]
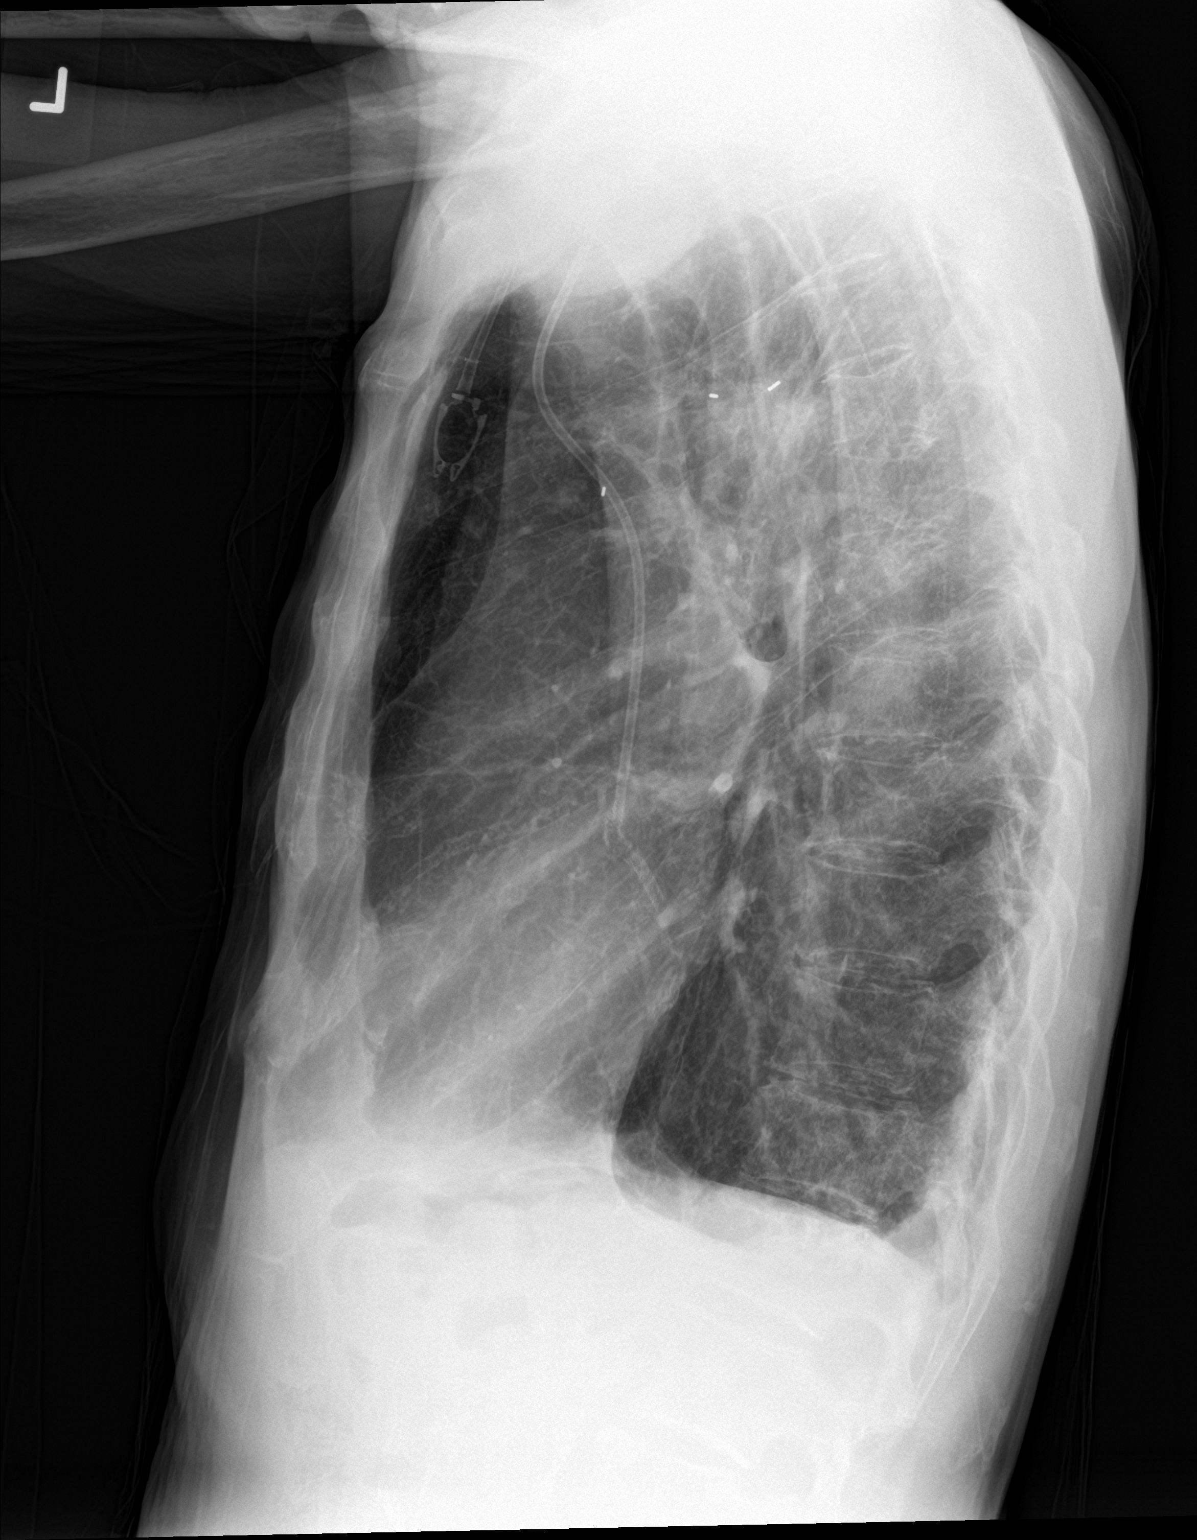

[2 of 2 positions shown; findings below may reference images not displayed]

FINDINGS: Surgical clips over the right chest . Interval removal of left chest
tube . No pneumothorax. PowerPort catheter noted with lead tip over
the superior vena cava. Persistent right upper lobe
pleural-parenchymal density. Improving right base infiltrate. Heart
size stable. Known bone lesions best identified by prior CT .
IMPRESSION: 1. Interim removal of left chest tube. No pneumothorax. PowerPort
catheter stable position.

2. Unchanged right upper lobe pleuroparenchymal density. Improving
right base infiltrate. Small right pleural effusion again noted .

3.  Known bone lesions best identified by prior CT.
